# Patient Record
Sex: Female | Born: 1955
Health system: Southern US, Community
[De-identification: ages and names within clinical notes are randomized; demographics above are authoritative.]

## PROBLEM LIST (undated history)

## (undated) DIAGNOSIS — IMO0001 Reserved for inherently not codable concepts without codable children: Secondary | ICD-10-CM

## (undated) DIAGNOSIS — E119 Type 2 diabetes mellitus without complications: Secondary | ICD-10-CM

## (undated) DIAGNOSIS — E079 Disorder of thyroid, unspecified: Secondary | ICD-10-CM

## (undated) DIAGNOSIS — N390 Urinary tract infection, site not specified: Secondary | ICD-10-CM

## (undated) DIAGNOSIS — E785 Hyperlipidemia, unspecified: Secondary | ICD-10-CM

## (undated) DIAGNOSIS — D573 Sickle-cell trait: Secondary | ICD-10-CM

## (undated) DIAGNOSIS — Z5189 Encounter for other specified aftercare: Secondary | ICD-10-CM

## (undated) DIAGNOSIS — Z940 Kidney transplant status: Secondary | ICD-10-CM

## (undated) DIAGNOSIS — N189 Chronic kidney disease, unspecified: Secondary | ICD-10-CM

## (undated) DIAGNOSIS — M1030 Gout due to renal impairment, unspecified site: Secondary | ICD-10-CM

## (undated) DIAGNOSIS — I10 Essential (primary) hypertension: Secondary | ICD-10-CM

## (undated) HISTORY — PX: OTHER SURGICAL HISTORY: SHX169

## (undated) HISTORY — DX: Reserved for inherently not codable concepts without codable children: IMO0001

## (undated) HISTORY — PX: PARATHYROIDECTOMY: SHX19

## (undated) HISTORY — DX: Hyperlipidemia, unspecified: E78.5

## (undated) HISTORY — DX: Encounter for other specified aftercare: Z51.89

## (undated) HISTORY — DX: Disorder of thyroid, unspecified: E07.9

## (undated) HISTORY — PX: TUBAL LIGATION: SHX77

---

## 2003-03-22 ENCOUNTER — Encounter: Admission: RE | Admit: 2003-03-22 | Discharge: 2003-03-22 | Payer: Self-pay | Admitting: Internal Medicine

## 2003-03-22 ENCOUNTER — Encounter: Payer: Self-pay | Admitting: Internal Medicine

## 2004-08-09 HISTORY — PX: VAGINAL HYSTERECTOMY: SUR661

## 2004-09-25 ENCOUNTER — Ambulatory Visit (HOSPITAL_COMMUNITY): Admission: RE | Admit: 2004-09-25 | Discharge: 2004-09-25 | Payer: Self-pay | Admitting: *Deleted

## 2005-07-19 ENCOUNTER — Encounter (INDEPENDENT_AMBULATORY_CARE_PROVIDER_SITE_OTHER): Payer: Self-pay | Admitting: Specialist

## 2005-07-19 ENCOUNTER — Inpatient Hospital Stay (HOSPITAL_COMMUNITY): Admission: RE | Admit: 2005-07-19 | Discharge: 2005-07-21 | Payer: Self-pay | Admitting: Obstetrics and Gynecology

## 2006-09-26 ENCOUNTER — Inpatient Hospital Stay (HOSPITAL_COMMUNITY): Admission: EM | Admit: 2006-09-26 | Discharge: 2006-10-04 | Payer: Self-pay | Admitting: Emergency Medicine

## 2006-09-26 ENCOUNTER — Ambulatory Visit: Payer: Self-pay | Admitting: Cardiology

## 2006-09-26 ENCOUNTER — Encounter (INDEPENDENT_AMBULATORY_CARE_PROVIDER_SITE_OTHER): Payer: Self-pay | Admitting: Specialist

## 2006-09-27 ENCOUNTER — Ambulatory Visit: Payer: Self-pay | Admitting: *Deleted

## 2006-09-30 ENCOUNTER — Encounter: Payer: Self-pay | Admitting: Internal Medicine

## 2006-10-07 ENCOUNTER — Ambulatory Visit (HOSPITAL_COMMUNITY): Admission: RE | Admit: 2006-10-07 | Discharge: 2006-10-07 | Payer: Self-pay | Admitting: Nephrology

## 2006-10-24 ENCOUNTER — Ambulatory Visit: Payer: Self-pay | Admitting: Cardiology

## 2006-11-22 ENCOUNTER — Ambulatory Visit: Payer: Self-pay | Admitting: Vascular Surgery

## 2007-02-16 ENCOUNTER — Telehealth (INDEPENDENT_AMBULATORY_CARE_PROVIDER_SITE_OTHER): Payer: Self-pay | Admitting: *Deleted

## 2007-02-28 ENCOUNTER — Ambulatory Visit: Payer: Self-pay | Admitting: Vascular Surgery

## 2007-05-06 ENCOUNTER — Emergency Department (HOSPITAL_COMMUNITY): Admission: EM | Admit: 2007-05-06 | Discharge: 2007-05-07 | Payer: Self-pay | Admitting: Emergency Medicine

## 2007-05-11 ENCOUNTER — Ambulatory Visit (HOSPITAL_COMMUNITY): Admission: RE | Admit: 2007-05-11 | Discharge: 2007-05-11 | Payer: Self-pay | Admitting: Nephrology

## 2007-06-06 ENCOUNTER — Inpatient Hospital Stay (HOSPITAL_COMMUNITY): Admission: EM | Admit: 2007-06-06 | Discharge: 2007-06-08 | Payer: Self-pay | Admitting: *Deleted

## 2007-06-06 ENCOUNTER — Ambulatory Visit: Payer: Self-pay | Admitting: Vascular Surgery

## 2007-06-07 ENCOUNTER — Ambulatory Visit: Payer: Self-pay | Admitting: Vascular Surgery

## 2007-09-19 ENCOUNTER — Ambulatory Visit (HOSPITAL_COMMUNITY): Admission: RE | Admit: 2007-09-19 | Discharge: 2007-09-19 | Payer: Self-pay | Admitting: Nephrology

## 2008-08-09 DIAGNOSIS — M1030 Gout due to renal impairment, unspecified site: Secondary | ICD-10-CM

## 2008-08-09 HISTORY — DX: Gout due to renal impairment, unspecified site: M10.30

## 2008-10-02 DIAGNOSIS — N186 End stage renal disease: Secondary | ICD-10-CM | POA: Insufficient documentation

## 2008-10-07 ENCOUNTER — Ambulatory Visit: Payer: Self-pay | Admitting: Gastroenterology

## 2008-10-07 DIAGNOSIS — R197 Diarrhea, unspecified: Secondary | ICD-10-CM

## 2008-10-10 ENCOUNTER — Encounter: Payer: Self-pay | Admitting: Gastroenterology

## 2008-11-20 ENCOUNTER — Ambulatory Visit: Payer: Self-pay | Admitting: Gastroenterology

## 2008-11-27 ENCOUNTER — Encounter: Admission: RE | Admit: 2008-11-27 | Discharge: 2008-11-27 | Payer: Self-pay | Admitting: Nephrology

## 2008-12-04 ENCOUNTER — Encounter: Admission: RE | Admit: 2008-12-04 | Discharge: 2008-12-04 | Payer: Self-pay | Admitting: Nephrology

## 2008-12-17 ENCOUNTER — Emergency Department (HOSPITAL_COMMUNITY): Admission: EM | Admit: 2008-12-17 | Discharge: 2008-12-17 | Payer: Self-pay | Admitting: Emergency Medicine

## 2009-02-06 HISTORY — PX: OTHER SURGICAL HISTORY: SHX169

## 2009-02-17 ENCOUNTER — Encounter: Admission: RE | Admit: 2009-02-17 | Discharge: 2009-02-17 | Payer: Self-pay | Admitting: Nephrology

## 2010-07-24 ENCOUNTER — Encounter
Admission: RE | Admit: 2010-07-24 | Discharge: 2010-07-24 | Payer: Self-pay | Source: Home / Self Care | Attending: Nephrology | Admitting: Nephrology

## 2010-08-07 ENCOUNTER — Encounter
Admission: RE | Admit: 2010-08-07 | Discharge: 2010-08-07 | Payer: Self-pay | Source: Home / Self Care | Attending: Nephrology | Admitting: Nephrology

## 2010-08-07 HISTORY — PX: BREAST BIOPSY: SHX20

## 2010-08-30 ENCOUNTER — Encounter: Payer: Self-pay | Admitting: Cardiology

## 2010-11-17 LAB — POCT I-STAT, CHEM 8
Calcium, Ion: 1.28 mmol/L (ref 1.12–1.32)
Chloride: 105 mEq/L (ref 96–112)
Glucose, Bld: 92 mg/dL (ref 70–99)
HCT: 33 % — ABNORMAL LOW (ref 36.0–46.0)
Hemoglobin: 11.2 g/dL — ABNORMAL LOW (ref 12.0–15.0)
Potassium: 5.3 mEq/L — ABNORMAL HIGH (ref 3.5–5.1)

## 2010-12-22 NOTE — Assessment & Plan Note (Signed)
OFFICE VISIT   [name] [name], [name] [name]  DOB:  Dec 23, 1955                                       06/06/2007  [id number]   HISTORY:  [name] was seen today regarding the right upper arm AV  fistula which I created in February of 2008.  Fistula has functioned  well for the last five dialysis sessions with them being able to utilize  two sticks in the fistula although that was difficult initially.  She  has had good flows.  She has no symptoms of steal in the right hand with  some mild cold sensation but no numbness or pain.  I would recommend  discontinuing the Diatek catheter at this point to see how the fistula  works on a long term basis and return to see [place] on a p.r.n. basis.   Nelda Severe Kellie Simmering, M.D.  Electronically Signed   JDL/MEDQ  D:  06/06/2007  T:  06/07/2007  Job:  [id number]   cc:   Donato Heinz, M.D.

## 2010-12-22 NOTE — Assessment & Plan Note (Signed)
OFFICE VISIT   LUZELENA, DURRANI  DOB:  [birth year]                                       02/28/2007  [id number]   The patient had an AV fistula in the right upper arm created by me in  February of this year which has an excellent pulse and palpable thrill.  It was utilized several times successfully, although on some occasions  they only stuck the fistula in one area.  Recently she had a problem  which sounds like an infiltration in the mid upper arm with some burning  discomfort and the fistula has not been used for the last few weeks and  the Diatek catheter has been utilized.   EXAMINATION:  The pulse of the fistula continues to have an excellent  pulse and a palpable thrill with good flow and seemingly a good caliber  vein.  There is no evidence of steal distally.  There is some mild  swelling in the mid-portion of the right upper arm.   I would recommend waiting for another 4 weeks before utilizing the  fistula again, letting any swelling resolve and if at that point the  fistula is utilized successfully, obtain a fistulogram and have her  return to our office for further evaluation.   Nelda Severe Kellie Simmering, M.D.  Electronically Signed   JDL/MEDQ  D:  02/28/2007  T:  03/01/2007  Job:  [id number]   cc:   Maudie Flakes. Hassell Done, M.D.

## 2010-12-22 NOTE — Discharge Summary (Signed)
NAMECHRISLEY, [name]           ACCOUNT NO.:  [id number]   MEDICAL RECORD NO.:  [id number]          PATIENT TYPE:  INP   LOCATION:  5507                         FACILITY:  [facility]   PHYSICIAN:  Darrold Span. Florene Glen, M.D.  DATE OF BIRTH:  Dec 05, 1955   DATE OF ADMISSION:  06/06/2007  DATE OF DISCHARGE:  06/08/2007                               DISCHARGE SUMMARY   DISCHARGE DIAGNOSES:  1. Gram-positive cocci bacteremia.  2. End-stage renal disease.  3. Hypertension.  4. Secondary hyperparathyroidism.  5. Anemia of chronic disease.   PROCEDURES:  1. June 07, 2007, removal of hemodialysis permanent catheter by Dr.      Deitra Mayo.  2. Hemodialysis.   CONSULTATIONS:  Dr. Deitra Mayo.   HISTORY OF PRESENT ILLNESS:  [name] [name] is a 55 year old African-  American female with a past medical history of end-stage renal disease  who presented to the emergency room with onset of fever, myalgias, and  nausea and vomiting.  She was in her usual state of health prior to this  incident.  She denies abdominal pain, nausea, vomiting, diarrhea, no  urinary symptoms, no upper respiratory symptoms.  The patient was seen  at [facility] who examined her AV fistula, found it to be very mature, and  scheduled her to have her catheter removed.   HOSPITAL COURSE:  1. Gram-positive cocci bacteremia.  Patient was admitted for a septic      workup.  Chest x-ray was within normal limits.  Urinalysis showed      no bacteria or white blood cells.  The patient was placed on      empiric vancomycin and Fortaz.  Arrangements were made for Dr.      Scot Dock to remove patient's hemodialysis permanent catheter which      was performed on June 07, 2007 after hemodialysis.  Patient      remained afebrile for the remainder of the hospitalization.  No      further nausea and vomiting.  She continued to do well.  Cultures      were positive after 24 hours for gram-positive cocci with no      __________ on  discharge.  2. Hypertension.  Patient continued with her beta blocker, lisinopril      was placed on hold, and there was no further intervention.  3. Secondary hyperparathyroidism.  Patient continued with her Renagel      as well as Sensipar and vitamin D.  No intervention.  4. Anemia of chronic disease.  Hemoglobins remained stable.  Patient      continued on her usual outpatient dose of Epogen.  No changes were      made.  5. End-stage renal disease.  Patient underwent hemodialysis x1 this      admission using her new AV fistula with 17-gauage needles, and we      will continue to do this until maximal maturity.   DISCHARGE MEDICATIONS:  1. Avandia 4 mg every day.  2. Levothyroxine 25 mcg every day.  3. Nephrovite every day.  4. Aspirin 81 mg every day.  5. Simvastatin 20 mg b.i.d.  6.  Sensipar 60 mg every day.  7. Renagel 800 mg 4 t.i.d. a.c. and 2 with snacks.   DISCHARGE INSTRUCTIONS:  The patient is to discontinue her lisinopril  and metoprolol at this time.  She will resume her renal diet and her  fluid restriction.  Increase activity as tolerated.  Patient may remove  dressing to her right upper chest and clean this area with antibacterial  soap and water and use a Band-Aid only.  She is instructed to have the  hemodialysis nurses assess this area to assure no signs of infection.  She will return to hemodialysis her usual scheduled day.  She is aware  that her new estimated dry weight will be 84.0 kg.  She will continue  vancomycin 750 mg IV every hemodialysis treatment through June 28, 2007 which will be a total of 3 weeks.  Her [name] [name] has been  discontinued.   DICTATED BY:  Leafy Kindle, PA      Darrold Span. Florene Glen, M.D.  Electronically Signed     Darrold Span. Florene Glen, M.D.  Electronically Signed    ACP/MEDQ  D:  06/08/2007  T:  06/09/2007  Job:  [id number]   cc:   [facility]

## 2010-12-22 NOTE — H&P (Signed)
[name] [name]           ACCOUNT NO.:  [id number]   MEDICAL RECORD NO.:  [id number]          PATIENT TYPE:  INP   LOCATION:  5507                         FACILITY:  [facility]   PHYSICIAN:  Darrold Span. Florene Glen, M.D.  DATE OF BIRTH:  08/30/1955   DATE OF ADMISSION:  06/06/2007  DATE OF DISCHARGE:  06/08/2007                              HISTORY & PHYSICAL   REASON FOR ADMISSION:  Fever and myalgias.   HISTORY OF PRESENT ILLNESS:  [name] [name] is a very pleasant 55-  year-old African-American female with a past medical history significant  for end-stage renal disease currently on hemodialysis, (Monday,  Wednesday and Friday at [facility]), type 2 diabetes  mellitus and anemia of chronic disease, who presented to the emergency  room today with abrupt onset of fever with max as 104, myalgias and  nausea and vomiting this a.m.  The patient was in her usual state of  health, prior to this episode, denied abdominal pain, nausea, vomiting,  diarrhea or urinary symptoms.  She denied any upper respiratory  symptoms.  Of note, the patient was seen in [facility] today.  She has a nice  AVF in place.  It has been cannulated x4, and she has been scheduled to  have her catheter removed.   ALLERGIES:  NONE.   MEDICATIONS:  1. Metoprolol 50 mg b.i.d.  2. Avandia 4 mg daily.  3. Levothyroxine 25 mcg daily.  4. Nephro-Vite daily.  5. Enteric-coated aspirin 81 mg daily.  6. Simvastatin 20 mg daily.  7. Sensipar 60 mg daily.  8. Renagel 4 t.i.d. a.c. and two with snacks.   SOCIAL HISTORY:  The patient currently lives in [place] area with her  husband.  She does have one daughter.  No alcohol abuse and no tobacco  use.  The patient does not work and is currently disabled.   FAMILY HISTORY:  Noncontributory.   REVIEW OF SYSTEMS:  Please see HPI, otherwise seven reviews of systems  were all negative.   PHYSICAL EXAM:  VITALS:  Temperature 100.3, pulse 119, respirations  16,  blood pressure 145/78.  GENERAL:  [name] [name] appears ill and slightly diaphoretic.  HEENT:  Head is atraumatic, normocephalic.  Pupils are equal, round,  reactive to light.  Oral mucosa is without injection or lesion.  NECK:  Neck is supple.  No JVD.  No palpable lymphadenopathy.  CHEST:  Clear to auscultation bilaterally.  No rales, rhonchi or rhonchi  or wheeze.  HEART:  Regular rate and rhythm.  S1-S2.  ABDOMEN:  Soft and nontender to palpation in all quadrants.  No rebound  no guarding.  EXTREMITIES:  Right AV fistula, upper arm with positive thrill.  Right  Perm-A-Cath.  No pulse, no erythema.   LABORATORY:  Pending.   ASSESSMENT AND PLAN:  As discussed with Dr. Erling Cruz, who was in to  see and examine this patient.  1. Fevers and weakness on admission.  Plan to admit.  Septic workup to      include chest x-ray, urinalysis, and blood cultures x2.  This is      likely  secondary to a permanent catheter infection.  Arrange for      UDS to pull Perm-A-Cath as soon as possible.  We will call a      consult.  Cover the patient with vancomycin and Fortaz empirically.  2. Hypertension.  The patient will continue her beta blocker at this      time.  Blood pressures are stable.  3. Secondary hyperparathyroidism.  Continue patient's Renagel,      Sensipar and vitamin D.  4. Anemia of chronic disease.  Follow hemoglobin, continue epo.      Leafy Kindle, PA      Greenfield C. Florene Glen, M.D.  Electronically Signed    MY/MEDQ  D:  06/08/2007  T:  06/09/2007  Job:  [id number]   cc:   [facility]

## 2010-12-25 NOTE — Discharge Summary (Signed)
[name], [name]           ACCOUNT NO.:  [id number]   MEDICAL RECORD NO.:  [id number]          PATIENT TYPE:  INP   LOCATION:  5508                         FACILITY:  [facility]   PHYSICIAN:  Domingo Mend, M.D. DATE OF BIRTH:  [birth year]   DATE OF ADMISSION:  09/26/2006  DATE OF DISCHARGE:  10/04/2006                               DISCHARGE SUMMARY   DISCHARGE DIAGNOSES:  1. New end-stage renal disease.  2. Type 2 diabetes.  3. Hypertension.  4. Hyperlipidemia.  5. Anemia.  6. Hypothyroidism.   DISCHARGE MEDICATIONS:  1. Nephro-Vite one tablet daily.  2. Aspirin 81 mg daily.  3. Zocor 20 mg one tablet q.h.s.  4. Metoprolol 25 mg b.i.d.  5. Avandia 4 mg daily.  6. RenaGel 800 mg two tablets t.i.d. with meals.   DISPOSITION AND FOLLOWUP:  The patient is discharged from the hospital  in stable condition.  She will be dialyzing at the [facility] on a Tuesday, Thursday, Saturday basis.  Her time slot is  for 11 a.m., however, she will need to be there for her first dialysis  on Thursday, February28 at 10:30 a.m.  She also has an appointment to  see Dr. Domenic Polite from [facility] on Monday, March17 at 11:30  a.m.  Upon followup, please note that PTH is pending from the hospital,  so please dose vitamin D as necessary and the patient's dry weight is to  be established.  Her last weight in the hospital was 87 kg.   IMAGING STUDIES:  A renal ultrasound on September 26, 2006, showed  bilateral echogenic kidneys; no evidence for hydronephrosis; and small  left renal cyst.  Urine was negative for eosinophils.  Chest x-ray on  September 28, 2006, showed catheter tip in the SVC.  No pneumothorax or  any other acute findings.  Another chest x-ray on September 28, 2006,  showed mild cardiac enlargement and left lung atelectasis with no CHF or  pneumonia.  A 2-D echo on February22, 2008, showed a normal left  ventricular systolic function with an ejection  fraction of 55-60%,  mildly increased left ventricular wall thickness and mild mitral  valvular regurgitation.   HISTORY AND PHYSICAL EXAMINATION:  For full details, please refer to the  chart; but in brief, [name] [name] is a 55 year old African American  woman who has a history of uncontrolled diabetes type 2 and hypertension  who does not regularly take her medication; who went to see her primary  care physician in  on the day of admission because she was having flu-  like symptoms consisting of cold chills, nausea and vomiting for the  past 2 weeks.  No fever, chest pain or shortness of breath.  Upon  arrival to the emergency room, her BUN was found to be 148 and a  creatinine of 24.3.  She reports no recent NSAID use and she does report  decreased urine output per patient.   ALLERGIES:  She has no known drug allergies.   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  She denies any  illicit drug use, alcohol use.  Was a smoker for about 15 years, but  quit in 1987.  Lives with her husband has two healthy children.   PHYSICAL EXAMINATION:  VITAL SIGNS:  Upon admission:  Blood pressure was  193/106, heart rate 106, temperature 97.2, O2 sats 100% on room air.  GENERAL:  Awake and oriented x3, not in acute distress.  CARDIOVASCULAR: Regular rate and rhythm with no murmurs, rubs or  gallops.  LUNGS:  She had mild bibasilar crackles and no pericardial rub.  ABDOMEN: Obese, soft, nontender, nondistended with positive bowel  sounds.  EXTREMITIES:  She had 1+ edema to mid-shin bilaterally.   ADMISSION LABS:  Sodium of 132, potassium 5.2, chloride 106, bicarb 14,  BUN 148, creatinine 24.3 with an anion gap of 17, glucose of 100.  WBCs  8.1, hemoglobin 8.1 and platelets of 182.   HOSPITAL COURSE:  New end-stage renal disease.  Again, unsure of  chronicity.  This is likely due to her uncontrolled diabetes and  hypertension.  No reversible causes found.  SPEP and UPEP were  negative  for a monoclonal spike.  No evidence of obstruction or hydronephrosis on  renal ultrasound.  The patient was seen by [facility] and sent to the OR.  At  the time, she has a right IJ catheter as well as a right arm AV fistula.  Dialysis orders have been faxed to the [facility], however, for  now she will be on a time of 4 hours with tight heparin.  Since her  potassium was 3.8, we were using a bath consisting of potassium of 4.0,  calcium of 2.5.  Her dry weight is still to be established.  Her last  dry weight in the hospital was of 87 kg.   Of note, she is hepatitis B surface antigen negative.   PTH is pending at time of dictation.  Her phosphorus is high, was 15  upon admission down to 7.2.  Secondary to her high calcium, we have her  on non-calcium containing phosphorus binders.  She is on Renagel 800 mg  two tablets t.i.d. with meals.   For her anemia, she was FOBT negative.  Her hemolytic panel including  LDH and haptoglobin were normal; negative for hemolysis.  Her iron  studies showed a serum iron of 59, a TRBC of 217 and a percent  saturation of 27%.  In the hospital, she was receiving Aranesp 100 mcg.  This will be converted to Epogen 10,000 units three times a week with  each hemodialysis as an outpatient.  Vitamin 0000000 and folic acid levels  were normal.   Chest pain.  Twice after dialysis, the patient had some chest pain.  Cardiology was consulted.  Because of her multiple risk factors, they  decided to go ahead with a cath which surprisingly was completely clean.  She will follow up with Dr. Domenic Polite as an outpatient for this reason.   Hypothyroidism.  The patient was discovered to be hypothyroid here in  the hospital.  Her TSH was elevated 5.858 and her free T4 was low at  0.75; so we have started her on 25 mcg of Synthroid in the hospital and  a repeat TSH should be checked in 4-6 weeks to make sure she is  adequately corrected.  Type 2 diabetes.  Her  hemoglobins were under good control in the  hospital with sliding scale insulin.  She will be discharged on her home  regimen of Avandia 4 mg daily.  Her CBG and  glycemia should be closely  monitored as an outpatient and her diabetes medications adjusted as  necessary.   Hypertension.  When the patient came in, blood pressure was very  elevated and she was placed on Norvasc and metoprolol.  However, her  Norvasc was decreased and subsequently discontinued and she will only be  on going home on metoprolol 25 mg p.o. b.i.d.   Other issues were stable throughout hospitalization.   Vital signs on day of discharge:  Blood pressure 101/66, heart rate 85,  temperature 99.6, O2 sats 97% on room air.   Labs upon discharge:  Sodium 138, potassium 3.6, chloride 103, bicarb  27, BUN 17, creatinine 8.11, glucose 134, albumin 2.2, calcium 9.9,  phosphorus 7.2, WBCs 8.7, hemoglobin 9.7 and platelets of 138.      Domingo Mend, M.D.  Electronically Signed     EH/MEDQ  D:  10/04/2006  T:  10/04/2006  Job:  [id number]

## 2010-12-25 NOTE — Op Note (Signed)
[name] [name], [name] [name]           ACCOUNT NO.:  [id number]   MEDICAL RECORD NO.:  [id number]          PATIENT TYPE:  INP   LOCATION:  9311                          FACILITY:  [facility]   PHYSICIAN:  Sherry A. Dickstein, M.D.DATE OF BIRTH:  April 12, 1956   DATE OF PROCEDURE:  07/19/2005  DATE OF DISCHARGE:                                 OPERATIVE REPORT   PREOPERATIVE DIAGNOSIS:  Fibroid uterus.   POSTOPERATIVE DIAGNOSIS:  Fibroid uterus.   PROCEDURE:  TAH.   SURGEON:  Dr. Irven Baltimore and Dr. Nunzio Cobbs.   ANESTHESIA:  General.   INDICATIONS:  This is a 55 year old who has had known fibroid uterus for  several years, however, the fibroids have increased in size significantly.  The patient has been followed with ultrasound. The edges has shown over the  past several months that the fibroids have suddenly increased in size. The  patient has had excessive bleeding. The patient was treated with Lupron. The  Lupron however, did not completely stop her bleeding. The patient has  continued to bleed despite this Lupron therapy. The patient was followed  with ultrasounds and despite the Lupron therapy, the fibroids were growing.  A special ultrasound was performed with Doppler study which showed no  increased blood flow in the uterus. Because of a increasing size of the  fibroids and continued bleeding, the patient is brought to the operating  room for abdominal hysterectomy.   FINDINGS:  A 12 week size uterus with multiple fibroids present, fallopian  tubes and ovaries within normal limits.   DESCRIPTION OF PROCEDURE:  The patient was brought into the operating room  and given adequate general anesthesia. She was placed in a frog-leg  position. Her abdomen and vagina were washed with Betadine. Foley catheter  was inserted into the bladder. The vagina was washed with Betadine as well.  The patient was taken out of the a frog-leg position. She was draped in a  sterile fashion.. A  Pfannenstiel incision was made, bleeders were  cauterized, the fascia was identified. The fascia was incised. The midline  area was identified and was incised sharply and bluntly and with cautery.  Bleeders were cauterized. The peritoneum was identified and entered sharply.  The Balfour retractor was placed in the abdominal cavity. The bowel was  packed off with wet lap pads. The pelvis was very tight because of the  excessive fat that was present as well as the size of the uterus. The left  round ligament was identified. It was grasped with a Kary Kos, it was doubly  stitched with #0 Vicryl sutures and it was cauterized in between. Kelly  clamps were placed on either side of the uterus and the right round ligament  was identified and suture ligated with #0 Vicryl as well. The left utero-  ovarian ligament was doubly clamped.  it was cut, it was free tied with #0  Vicryl ligature and then a second ligature was taken. The same procedure was  performed on the right, adequate hemostasis was present. The bladder flap  was developed with sharp dissection and the bladder was developed off of the  lower uterine segment and cervix with blunt dissection. The left uterine  artery was identified, it was doubly clamped, cut and suture ligated with #0  Vicryl ligasure. The same procedure was performed on the right. The left  uterosacral ligament was clamped, cut and suture ligated with #0 Vicryl. The  cardinal ligaments were clamped, cut and suture ligated with #0 Vicryl  ligasures. The angles of the vagina were identified. They were clamped, cut  and suture ligated with #0 Vicryl. All of this surgery was very difficult  because of the size of the fibroids and the size of the patient. The uterus  was then taken off of the cervix with sharp dissection and the uterus was  sent to pathology for frozen section to assure that none of the fibroids  were malignant. The cervix was then continued to be dissected  and the blood  supply, clamped, cut and suture ligated. The angles of the vagina where were  clamped and cut and suture ligated and the cervix was removed from the  vagina in this fashion. The vaginal cuff was then closed first by placing a  whip stitch along the edge of the vagina and then the cervix was closed with  #0 Vicryl in figure-of-eight interrupted stitches. There was some bleeding  present along the cervical cuff. This was closed both by individual  cauterizing vessels and then by #0 Vicryl figure-of-eight stitches. Once  adequate hemostasis was present, the uterosacral ligaments were tied to the  angles of the vagina for support. The abdomen was irrigated with large  amounts of warm saline, small bleeders were cauterized. The whole pelvis was  inspected, there was no significant bleeding present. All packs were removed  from the abdomen. The layers were inspected, small bleeders were present  near the muscle and near the fascial edges. These were cauterized. The  fascia was then closed using #0 Monocryl in a running stitch running  laterally to the midline x2. The fat was then closed using 2-0 plain in a  running stitch. The skin was then closed using 4-0 Monocryl in a  subcuticular running stitch. The skin was also closed with Dermabond. The  patient was then awakened. She was extubated. She was moved from the  operating table to a stretcher in stable condition. Complications were none.  Estimated blood loss 300 mL. Specimen was uterus and cervix.      Sherry A. Irven Baltimore, M.D.  Electronically Signed     SAD/MEDQ  D:  07/19/2005  T:  07/19/2005  Job:  [id number]

## 2010-12-25 NOTE — Consult Note (Signed)
[name]  SHANTOYA, LAME           ACCOUNT NO.:  [id number]   MEDICAL RECORD NO.:  [id number]          PATIENT TYPE:  INP   LOCATION:  5506                         FACILITY:  [facility]   PHYSICIAN:  Cristopher Estimable. Lattie Haw, MD, FACCDATE OF BIRTH:  September 12, 1955   DATE OF CONSULTATION:  09/29/2006  DATE OF DISCHARGE:                                 CONSULTATION   PRIMARY CARE PHYSICIAN:  Theodoro Parma. Conley Canal, M.D.   HISTORY OF PRESENT ILLNESS:  A 55 year old woman with end-stage renal  disease, referred for evaluation of chest discomfort with ischemic  quality.  [name] [name] has no prior cardiac history.  She has never been  seen by a cardiologist.  She has never undergone any significant cardiac  testing.  Unfortunately, she has significant cardiovascular risk  factors, including end-stage renal disease, diabetes, and hypertension.  Creatinine has progressed from a level of 2.8 approximately 14 months  ago to 22 when she presented with symptoms of uremia.  She underwent her  first dialysis treatment without difficulty.  During the second  performed today, she noted feeling of chest constriction or chest  stiffness associated with moderate dyspnea.  This persisted for  approximately 20 minutes and resolved spontaneously.  Following  dialysis, she had a second episode that was treated with sublingual  nitroglycerin, with apparent benefit.  EKG obtained in proximity to  these episodes shows no significant change from a tracing performed in  2006.  There are slightly more prominent lateral T-wave abnormalities.   The patient does not have significant congestive heart failure when she  presented.  She did have some ankle edema.  Her chest x-ray shows only  scarring or atelectasis in the lingula.   The chest discomfort was relatively mild..  There was no radiation.  There was no associated diaphoresis or nausea.  There was no  relationship to position, and the discomfort was not pleuritic.  The  patient had never experienced these symptoms previously.   PAST MEDICAL HISTORY:  1. Severe hypertension, for which she has not been followed closely by      a physician and is not reliably taking medication.  2. Ditto for her renal history.  3. She has apparently been treated with metformin for diabetes until      recently, despite her known renal insufficiency.  She was      subsequently changed to Actos.   PAST SURGICAL HISTORY:  1. Only notable for total abdominal hysterectomy, without      oophorectomy.  2. She underwent endoscopy in the past, which revealed only hiatal      hernia.   MEDICATIONS PRESCRIBED FOR HER AT THE TIME OF ADMISSION:  1. Avandia at an unknown dosage.  2. HCTZ 25 mg daily.  3. Lotrel 2.5/10 mg daily.   She has no known drug allergies.   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  No excessive use of alcohol.  Lifestyle is sedentary.  She  has not noted dyspnea or chest discomfort with her usual activities,  which includes some housework.   FAMILY HISTORY:  Mother is alive and well in her 81s; father died at age  56 with emphysema.   REVIEW OF SYSTEMS:  Notable for not having received vaccinations in the  past due to her refusal, intermittent headaches, some sort of history of  seizures, anemia, urinary frequency, the need for corrective lenses for  myopia, recent pruritus, and intermittent hot flashes and chills.  She  has had anorexia of late.  Her weight in recent weeks is uncertain.  All  other systems reviewed and are negative.   PHYSICAL EXAMINATION:  GENERAL:  Pleasant, remarkably well-appearing  woman.  VITAL SIGNS:  The weight is 94 kg, down from 96 kg at presentation.  Maximal temperature 99, blood pressure 115/60, heart rate 85 and  regular, respirations 18.  O2 saturation 100% on 2 liters nasal oxygen.  Urine output has been on  the order of 1 liter per day.  HEENT:  EOMs full; normal oral mucosa; normal lids and conjunctivae.  NECK:  No jugular venous distension.  CARDIAC:  Normal first and second heart sounds; modest early systolic  ejection murmur; no rub appreciated; no pulsus paradoxus.  LUNGS:  Entirely clear.  ABDOMEN:  Soft and nontender; normal bowel sounds; no organomegaly.  EXTREMITIES:  Trace edema; normal distal pulses.  MUSCULOSKELETAL:  No joint deformities.  NEUROMUSCULAR:  Symmetric strength and tone.  SKIN:  No significant lesions.  PSYCHIATRIC:  Alert and oriented; normal affect.   EKG:  Normal sinus rhythm at a rate of 95; biphasic and slightly  inverted T-waves in leads I and L; QT prolongation.   Other laboratory notable for initial hemoglobin of 7, and hematocrit of  20, now 9.5 and 27.4, presumably related to transfusion; MCV was low at  76; creatinine is decreased to 11.6, and BUN to 51.  Electrolytes are  otherwise normal.  INR was 1.2 on admission.  Lipid profile is  suboptimal, with total cholesterol 211, LDL of 150, and HDL of 32.  Initial markers are negative, with a slightly elevated total CK, and  negative troponin and MB.  Initial phosphorus was 14.7, with a calcium  of 6.9.  Haptoglobin is normal.  Iron is on the low side, as is iron  binding.  TSH is slightly elevated at 5.9.  A1c is 6.   IMPRESSION:  [name] [name] presents with progressive renal insufficiency,  presumably related to hypertension and to a lesser extent diabetes.  There may be some contribution fusion from her use of ACE inhibitors,  but this is likely modest and will not result in significant reversal of  her end-stage disease.   Blood pressure control is remarkably improved after only two dialysis  treatments.  Antihypertensives will be adjusted as necessary.   She has mild hyperlipidemia but multiple cardiovascular risk factors.  She should be treated with a statin.  Her chest discomfort is of  worrisome quality.  She does not appear to  have enough fluid overload to attribute this to congestive heart  failure.  This certainly did not sound like typical GERD.  We will  obtain follow-up cardiac markers and an EKG in the morning as well as an  echocardiogram.  Treatment will be intensified by adding intravenous  nitroglycerin for now.  If chest discomfort recurs, intravenous heparin  will be added.  We will start low-dose beta  blocker.  Once initial evaluation is complete, we will determine whether  stress testing or coronary angiography is warranted.  We greatly appreciate the referral of this nice woman and will be happy  to follow her with you.      Cristopher Estimable. Lattie Haw, MD, [facility]  Electronically Signed     RMR/MEDQ  D:  09/29/2006  T:  09/30/2006  Job:  [id number]

## 2010-12-25 NOTE — Op Note (Signed)
NAME:  [name] [name], RATTAN NO.:  [id number]   MEDICAL RECORD NO.:  [id number]          PATIENT TYPE:  AMB   LOCATION:  ENDO                         [facility]ACILITY:  [facility]   PHYSICIAN:  Waverly Ferrari, M.D.    DATE OF BIRTH:  [birth year]   DATE OF PROCEDURE:  09/25/2004  DATE OF DISCHARGE:                                 OPERATIVE REPORT   PROCEDURE:  Colonoscopy.   INDICATIONS:  Iron-deficiency anemia, rule out GI causes of blood loss.   ANESTHESIA:  Demerol 10, Versed 1 mg.   PROCEDURE:  With the patient mildly sedated in the left lateral decubitus  position, the Olympus videoscopic colonoscope was inserted in the rectum and  passed under direct vision to the cecum identified by ileocecal valve and  crow's foot of the cecum, both of which were photographed. From this point,  the colonoscope was slowly withdrawn taking circumferential views of colonic  mucosa stopping only in the rectum which appeared normal in direct and  retroflexed view. The endoscope was straightened, withdrawn. The patient's  vital signs and pulse, remained stable. The patient tolerated procedure well  without complications.   FINDINGS:  Unremarkable colonoscopic examination to the cecum.   PLAN:  Given the fact the patient is anemic and describes heavy menses with  a negative GI examination, I think that further evaluation can be provided  by gynecology.      GMO/MEDQ  D:  09/25/2004  T:  09/25/2004  Job:  [id number]   cc:   Sherry A. Irven Baltimore, M.D.  [address]  [place]  [place] [address]  Fax: [phone]

## 2010-12-25 NOTE — H&P (Signed)
NAME:  [name] [name], [name] [name] NO.:  [id number]   MEDICAL RECORD NO.:  [id number]          PATIENT TYPE:  EMS   LOCATION:  MAJO                         FACILITY:  [facility]   PHYSICIAN:  [name] [name], D.O.    DATE OF BIRTH:  Feb 23, 1956   DATE OF ADMISSION:  09/26/2006  DATE OF DISCHARGE:                              HISTORY & PHYSICAL   PRIMARY CARE PHYSICIAN:  Dr. Harley [name].   CHIEF COMPLAINT:  Elevated lab work in an outpatient setting.   HISTORY OF PRESENT ILLNESS:  [name] [name] was presenting to an  urgent care in [name], [place] today with reports of cold symptoms  for the past 2 weeks.  She resides in [name], [place].  She has seen  Dr. Seleta [name] in the past.  In any event, she reports some nausea,  vomiting and diarrhea with chills about 2 weeks ago and in addition she  reported because of swelling in her right foot.  In any event, lab work  was performed there and she was discovered to have an elevated  creatinine of 22.0.  This was called in to Dr. Conley [name] and he requested  her to be admitted to the hospital.   In the emergency department she was noted to be in hypertensive crisis  with a systolic of [id number] and diastolic over [id number].  She was nonoliguric as  a Foley catheter was placed.  Her creatinine, in the emergency  department, was 24.0, potassium of 5.2.  An EKG is currently pending at  this time.   She was told that she had some kidney problems with regard to her lab  work about 1-1/2 years ago.  She did not followup closely with Dr.  Harley [name].  She does have a history of diabetes.  He did call and  request that she stop her Glucophage at that time.  In any event, she is  in the department alert and oriented, comfortable.  She reports some  problems with foods tasting bad; otherwise, no other complaints.   PAST MEDICAL HISTORY:  Significant for:  1. Hypertension.  2. Diabetes.  3. She is status post a total abdominal  hysterectomy.  She retains her      ovaries, appendix and gallbladder.  She has undergone a GI      evaluation in the past by Dr. Lajoyce [name] with the only finding of a hiatal      hernia.   MEDICATIONS:  She reports that she takes:  1. Avandia, she does not know the dose.  2. Hydrochlorothiazide 25 mg daily.  3. Lotrel 2.5 and 10 mg on a daily basis.   ALLERGIES:  SHE HAS NO KNOWN DRUG ALLERGIES.   SOCIAL HISTORY:  She works as a Pharmacist, hospital.  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  She does not drink or smoke.  She is quite sedentary.   FAMILY HISTORY:  Unremarkable.  Mother is in her 16s alive and healthy.  Father died at age 64 with emphysema.   REVIEW OF SYSTEMS:  She has been having some fatigability, some cold  symptoms.  No blood in her  stools or chest pain.  She has had some  occasional shortness of breath, some swelling of her extremities, right  greater than left.  Otherwise, further review is unremarkable.   PHYSICAL EXAMINATION:  VITAL SIGNS:  In the department, her blood  pressure was elevated at 196/106.  Heart rate was 90.  Respirations 18.  O2 saturation 100%.  GENERAL:  Patient is alert and oriented in no acute distress.  HEENT:  Reveals the head to be normocephalic, atraumatic.  Extraocular  muscles are intact.  NECK:  Supple.  Nontender.  No palpable thyromegaly or mass.  CARDIOVASCULAR EXAM:  Reveals normal S1, S2.  There is a soft  holosystolic murmur in the left sternal border.  No apparent radiation  to the axilla.  LUNGS:  She exhibits normal effort.  There is no dullness to percussion.  They are clear bilaterally.  ABDOMEN:  Soft, obese, nontender.  LOWER EXTREMITIES:  Reveal bipedal ankle edema, +1-2.  NEUROLOGIC EXAM:  Reveals her to be euthymic with focal deficits.   LABORATORY DATA:  Revealing her sodium to be 137, potassium 5.0, BUN 48,  creatinine 24.0, glucose 100, WBC of 8.1, hemoglobin 8.1, platelet count  182.  Chest radiograph is pending, as well as EKG  pending, being done in  the emergency department.   ASSESSMENT:  1. Acute renal failure/nonoliguric.  2. Hypertensive urgency.  3. Probable noncardiogenic heart failure.  4. Obesity.  5. Diabetes mellitus.  6. Very asymmetrical lower extremity edema.   PLAN:  At this time, we are going to consult nephrology, admit Ms.  [name] to inpatient telemetry floor and administer IV Lasix and treat  her poorly controlled hypertension.  We will order [name] [name], [name] [name], as well  as initial rheumatologic screen, though this is more than likely related  to longstanding hypertensive nephrosclerosis.  We will check an  ultrasound as well and await further input from Dr. Justin [name] is on call,  paged and awaiting response at this juncture.      [name] [name], D.O.  Electronically Signed     ESS/MEDQ  D:  09/26/2006  T:  09/27/2006  Job:  [id number]   cc:   Sherril Croon, M.D.  Theodoro Parma. Francina Ames., M.D.

## 2010-12-25 NOTE — Op Note (Signed)
NAME:  [name] [name], [name] [name] NO.:  [id number]   MEDICAL RECORD NO.:  [id number]          PATIENT TYPE:  AMB   LOCATION:  ENDO                         FACILITY:  [facility]   PHYSICIAN:  Waverly Ferrari, M.D.    DATE OF BIRTH:  [birth year]   DATE OF PROCEDURE:  09/25/2004  DATE OF DISCHARGE:                                 OPERATIVE REPORT   PROCEDURE:  Upper endoscopy.   INDICATIONS:  Presumed Hemoccult positivity.  Patient with iron deficiency  anemia   ANESTHESIA:  1.  Demerol 50 mg.  2.  Versed 6 mg.   PROCEDURE:  With patient mildly sedated in the left lateral decubitus  position, the Olympus videoscopic endoscope was inserted in the mouth,  passed under direct vision through the esophagus which appeared normal into  the stomach.  Fundus, body, antrum, duodenal bulb, second portion of  duodenum appeared normal.  From this point, the endoscope was slowly  withdrawn taking circumferential views of duodenal mucosa until the  endoscope had been pulled back into the stomach, placed in retroflexion to  view the stomach from below.  The endoscope was then straightened,  withdrawn, taking circumferential views of the remaining gastric and  esophageal mucosa.  The patient's vital signs, pulse oximeter remained  stable. The patient tolerated procedure well without apparent complications.   FINDINGS:  Unremarkable examination other than a small hiatal hernia.   PLAN:  To proceed to colonoscopy.      GMO/MEDQ  D:  09/25/2004  T:  09/25/2004  Job:  [id number]   cc:   Sherry A. Irven Baltimore, M.D.  [address]  [place]  [place] [address]  Fax: [phone]

## 2010-12-25 NOTE — Discharge Summary (Signed)
[name] [name], [name] [name]           ACCOUNT NO.:  [id number]   MEDICAL RECORD NO.:  [id number]          PATIENT TYPE:  INP   LOCATION:  9311                          FACILITY:  [facility]   PHYSICIAN:  Sherry A. Dickstein, M.D.DATE OF BIRTH:  [birth year]   DATE OF ADMISSION:  07/19/2005  DATE OF DISCHARGE:                                 DISCHARGE SUMMARY   Problem #1.  Fibroid uterus.   SUBJECTIVE:  The patient is a 55 year old, gravida 3, para 2-0-1-2 woman who  has had menorrhagia for several years which has been getting worse.  The  patient has had a known fibroid uterus.  The patient was treated with  Lupron.  However, she had the increased size of her fibroids despite being  on her Lupron. The patient was followed with ultrasound and because of the  increasing size of her uterus and continued bleeding, the patient is brought  to the operating room for hysterectomy.  The patient underwent total  abdominal hysterectomy without difficulty.   PHYSICAL EXAMINATION:  HEENT:  Within normal limits.  NECK:  Without lymphadenopathy. Thyroid without nodules.  CHEST:  Clear to auscultation.  HEART:  Regular rhythm without murmur.  BREASTS:  Without masses.  ABDOMEN:  Obese, soft, nontender without mass.  GU:  External genitalia within normal limits. Cervix within normal limits.  Uterus anteflexed, irregular, approximately 12-week size.  Adnexa without  mass.   HOSPITAL COURSE:  The patient was admitted and brought to the operating room  where total abdominal hysterectomy was performed.  Her preoperative  hematocrit was 33.8, hemoglobin 11.1.  Urinalysis was normal.  Her  preoperative creatinine was 2.8 and potassium 4.4 and sodium 139.  Postoperatively she did well.  She remained afebrile.  Vital signs remained  stable.  Her postoperative hematocrit on her first postoperative day was  28.4 with hemoglobin of 9.6.  The patient is discharged to home on her  second postoperative.   ASSESSMENT:   Stable, status post total abdominal hysterectomy.   PLAN:  Follow up in the office in two to three weeks.  She will call for  temperature greater than 101, heavy bleeding or severe pain.   DISCHARGE MEDICATIONS:  She is discharged to home on Tylox one to two every  four hours p.r.n. or Darvocet-N 100 one to two every four hours p.r.n.      Sherry A. Irven Baltimore, M.D.  Electronically Signed     SAD/MEDQ  D:  07/21/2005  T:  07/21/2005  Job:  [id number]   cc:   Theodoro Parma. Francina Ames., M.D.  Fax: [phone]

## 2010-12-25 NOTE — Assessment & Plan Note (Signed)
[facility]                            CARDIOLOGY OFFICE NOTE   NAME:Sortino, DARELY                    MRN:          [id number]  DATE:10/03/2006                            DOB:          [birth year]    THIS IS NOT THE FORMAL DISCHARGE SUMMARY.  THIS NOTE WAS DICTATED BY  [facility] TO PRESUMABLY FACILITATE POST HOSPITAL FOLLOW-UP STATUS  POST CARDIAC CATHETERIZATION.  THE PATIENT WAS ADMITTED AND DISCHARGED  FROM THE RENAL SERVICE.   DISCHARGE SUMMARY:  The patient was admitted September 26, 2006 and  discharged October 03, 2006.  She has no known drug allergies.   PRINCIPAL DIAGNOSES:  1. Admitted in acute renal failure with symptoms of uremia.  2. Creatinine 2.8 14 months ago, has progressed to creatinine 24 in      the emergency room February 18.  Potassium 5.2.  3. Admission February 18 also including hypertensive urgency with      systolic blood pressure in the 190s.  4. The patient has no previous cardiac history.  5. The patient is status post left heart catheterization February 25      with a finding of normal coronary anatomy and an ejection fraction      of 60%.  Both circumflex and right coronary artery are co-dominant.  6. The patient had a right upper extremity LV fistula placed February      20, as well as a Diatek catheter by the [facility] folks.  The patient      presented outpatient to hemodialysis Tuesday, Thursday and      Saturday.  7. Non-sustained ventricular tachycardia.   SECONDARY DIAGNOSES:  1. History of diabetes.  2. Obesity.  3. Known hypertension.  4. Status post total abdominal hysterectomy without oophorectomy.  5. Colonoscopy and gastroduodenoscopy in the past revealing hiatal      hernia but now intrinsic bleeding.   PROCEDURES:  1. September 28, 2006, creation of right upper extremity AV fistula      Alfredia Client) with implant of Diatek catheter.  2. 2D echocardiogram September 30, 2006, ejection fraction  55-60%, mild      diastolic dysfunction, mild mitral regurgitation.   BRIEF HISTORY:  Ms. Leng is a 55 year old female who presented to  [facility]gent [facility] in [facility] with report of cold symptoms which  had been persistent for the past 2 weeks.  She was reporting nausea,  vomiting, diarrhea with chills.  She also had some swelling in her right  foot.  Laboratory work was done and showed a creatinine of 22.  This was  called to her primary care giver, Dr. Harley Alto.  He requested  that she be admitted to the hospital.   In the emergency room, she was noted to be in hypertensive crisis, with  systolic blood pressure [id number].  A Foley catheter was placed as she was non-  oliguric.  Her creatinine in the emergency department was 24, with a  potassium of 5.2.   Apparently, 1-1/2 years ago she was noted to have some kidney problems  with notable creatinine about 2.8.  She has not followed  up closely with  Dr. Harley Alto.  She also has a history of diabetes and has been  on Glucophage.  This has now been discontinued.  The patient also  complains that she has some problem with taste alterations in food, but  no other complaints.  The plan on admission here at [facility]  February 18 is to consult nephrology, to place her on inpatient  telemetry, to start IV Lasix and treat the poorly controlled  hypertension.  A renal ultrasound will also be planned.   HOSPITAL COURSE:  The patient presenting to [facility] February  18 with hypertensive crisis, non-oliguric acute renal failure with  uremic symptoms.  The patient had an ultrasound of the kidneys which  showed echogenic kidneys bilaterally, no evidence of hydronephrosis.  She was seen by nephrology here at [facility], as well as the  cardiovascular thoracic surgeons.  On February 20, right upper extremity  AV fistula was performed as well as placement of a Diatek catheter.  The  patient  subsequently started hemodialysis.  On her second hemodialysis,  it was noted that she had chest heaviness and cardiology was asked to  consult.  2D echocardiogram was ordered which showed ejection fraction  as mentioned, 55-60%, no wall motion abnormalities, mild mitral  regurgitation.  One Troponin I was ordered and it was 0.02.  The patient  did have persistent recurring chest discomfort and with the presence of  risk factors, including diabetes and hypertensive nephrosclerosis, the  patient was scheduled for cardiac catheterization.  This was done  February 25.  This study showed normal coronary anatomy, ejection  fraction of 60%.  The patient's blood pressure has moderated subsequent  to her admission.  Blood pressure medications have mainly been tapered  off.   DISCHARGE MEDICATIONS:  At the time of discharge, she is tolerating:  1. Metoprolol 50 mg tablets, 1/2 tab in the morning, 1/2 tab in the      evening.  2. Enteric-coated aspirin 81 mg daily.  3. Zocor 20 mg daily at bedtime.  4. She had been on Norvasc 10 mg.  This was tapered down to 2.5 mg      daily at bedtime and subsequently discontinued today, on the day of      discharge.   MOST RECENT LABS:  On discharge February 26, white cells 8.1, hemoglobin  9.8, hematocrit 29.4, platelets 147.  The basic metabolic panel February  26, sodium 132, potassium 4, chloride 100, carbonate 26, glucose 107,  BUN 14, creatinine 6.84.  Urine culture this admission was negative.  Hepatitis panel was negative.  Free T4 was 0.75.   FOLLOW UP:  She has followup appointment post-catheterization as well  blood pressure control on Monday, March 17 at 11:30 with Dr. Domenic Polite.      Sueanne Margarita, PA  Electronically Signed      Deboraha Sprang, MD, [facility]  Electronically Signed   GM/MedQ  DD: 10/04/2006  DT: 10/04/2006  Job #: [id number]

## 2010-12-25 NOTE — Op Note (Signed)
[name], [name] [name]           ACCOUNT NO.:  [id number]   MEDICAL RECORD NO.:  [id number]          PATIENT TYPE:  INP   LOCATION:  5506                         FACILITY:  [facility] [facility]   PHYSICIAN:  Nelda Severe. Kellie Simmering, M.D.  DATE OF BIRTH:  26-Jun-1956   DATE OF PROCEDURE:  09/28/2006  DATE OF DISCHARGE:                               OPERATIVE REPORT   PREOPERATIVE DIAGNOSIS:  End-stage renal disease.   POSTOPERATIVE DIAGNOSIS:  End-stage renal disease.   OPERATION:  1. Creation of right upper arm brachial artery to cephalic vein      arteriovenous fistula (Kauffman shunt).  2. Bilateral ultrasound localization internal jugular veins.  3. Insertion Diatek catheter via right internal jugular vein (24 cm).   SURGEON:  Nelda Severe. Kellie Simmering, M.D.   FIRST ASSISTANT:  Jacinta Shoe, P.A.   ANESTHESIA:  Local.   PROCEDURE:  The patient taken to the operating room, placed in supine  position at which time the right upper extremity was prepped with  Betadine scrub solution and draped in a routine sterile manner.  After  infiltration of 1% Xylocaine, transverse incision was made in the  antecubital area, antecubital vein dissected free.  Cephalic branch was  about 3 to 3.5 mm in size, was dissected free.  There was one hole where  venipunctures had been performed which was repaired with 6-0 Prolene.  It was a satisfactory vein.  Artery was exposed beneath the fascia,  encircled with vessel loops and then occluded proximally and distally  opened with a 15 blade extended with Potts scissors.  Cephalic vein  having been transected, was anastomosed end-to-side with 6-0 Prolene  clamps released.  There was good pulse and thrill in the fistula and  good Doppler flow up to the shoulder.  No protamine was given and no  heparin was given.  Wound was irrigated with saline, closed in layers  with Vicryl in subcuticular fashion.  Sterile dressing applied.  Attention turned to the upper chest and neck  which was exposed and both  internal jugular veins were then imaged using B-mode ultrasound.  Both  noted to be widely patent and normal in appearance.  After prepping and  draping in routine sterile manner, the right internal jugular vein was  entered using a supraclavicular approach.  Guidewire passed into the  right atrium under fluoroscopic guidance.  After dilating the tract  appropriately, 24-cm Diatek catheter positioned through peel-away  sheath, tunneled peripherally, secured with nylon sutures.  The wound  closed with Vicryl in subcuticular fashion.  Sterile dressing applied.  The patient taken to recovery room in satisfactory condition.           ______________________________  Nelda Severe Kellie Simmering, M.D.     JDL/MEDQ  D:  09/28/2006  T:  09/29/2006  Job:  [id number]

## 2010-12-25 NOTE — Cardiovascular Report (Signed)
NAMEMarland Kitchen  IYMONA, NEWFIELD NO.:  [id number]   MEDICAL RECORD NO.:  [id number]          PATIENT TYPE:  INP   LOCATION:  5508                         FACILITY:  [facility]   PHYSICIAN:  Satira Sark, MD DATE OF BIRTH:  07-06-1956   DATE OF PROCEDURE:  DATE OF DISCHARGE:                            CARDIAC CATHETERIZATION   REQUESTING CARDIOLOGIST:  Dr. Virl Axe.   PRIMARY CARDIOLOGIST:  Dr. Jacqulyn Ducking.   INDICATIONS:  Ms. Angelica is a 55 year old woman with a history of  hypertension, type 2 diabetes mellitus and end-stage renal disease,  recently started on hemodialysis.  She has had chest pain associated  with hemodialysis sessions as well as hypotension.  Cardiac markers have  been reassuring, although given her symptomatology and risk factors for  cardiovascular disease, she is referred for a diagnostic cardiac  catheterization to clearly outline the coronary anatomy.  The potential  risks and benefits of this were explained to the patient in advance and  informed consent was obtained.   PROCEDURE PERFORMED:  1. Left heart catheterization  2. Selective coronary angiography.  3. Left ventriculography.   ACCESS AND EQUIPMENT:  The area about the right femoral artery was  anesthetized with 1% lidocaine and a 6-French sheath was placed in the  right femoral artery via the modified Seldinger technique.  Standard  preformed 6-French JL-4, 5-French No-Torque right and 6-French angled  pigtail catheters were used.  All exchanges were made over a wire.  A  total of 135 mL of Omnipaque were used.  The patient tolerated the  procedure well without immediate complications.   HEMODYNAMIC RESULTS:  Aorta 129/70 mmHg.  Left ventricle 129/5 mmHg.   ANGIOGRAPHIC FINDINGS:  1. The left main coronary artery is relatively short, giving rise to      the left anterior descending and circumflex coronary arteries.  No      significant flow-limiting coronary  atherosclerosis is noted.  2. The left anterior descending is a medium- to large-caliber vessel      with 4 small diagonal branches.  There are minor luminal      irregularities noted throughout including some tapering distally,      although no significant flow-limiting coronary atherosclerosis is      noted.  3. The circumflex coronary artery is a relatively large-caliber      vessel, most of which is made up of a large bifurcating obtuse      marginal coming off of a relatively small AV groove branch.  This      heads in the direction of the posterior descending circulation and      is most likely codominant with the right coronary artery.  There      are some minor luminal irregularities, but no significant flow-      limiting coronary atherosclerosis.  4. The right coronary artery is a relatively small- to medium-caliber,      yet codominant vessel.  There are minor luminal irregularities,      although no flow-limiting coronary artery disease is noted.   Left ventriculography was performed in the RAO projection and revealed  an ejection fraction of approximately 60% with no focal anterior or  inferior wall motion abnormality and no significant mitral  regurgitation.   DIAGNOSES:  1. No significant flow-limiting coronary atherosclerosis noted within      the major epicardial vessels.  2. Left ventricular ejection fraction of approximately 60% with no      significant mitral regurgitation, no aortic valve gradient and a      left ventricular end-diastolic pressure of 5 mmHg.   I reviewed the results with the patient and her family.      Satira Sark, MD  Electronically Signed     SGM/MEDQ  D:  10/03/2006  T:  10/04/2006  Job:  [id number]

## 2010-12-25 NOTE — Assessment & Plan Note (Signed)
[facility]                            CARDIOLOGY OFFICE NOTE   NAME:[name], [name] WEARY                  MRN:          [id number]  DATE:10/24/2006                            DOB:          03-22-1956    NEPHROLOGIST:  Dr. Jeneen Rinks Deterding.   REASON FOR VISIT:  Followup cardiac catheterization.   HISTORY OF PRESENT ILLNESS:  [name] [name] is a pleasant 55 year old  woman who was seen in the hospital by Dr. Lattie Haw and subsequently Dr.  Caryl Comes for evaluation of chest pain in the setting of end stage renal  disease being initiated on hemodialysis.  Dr. Caryl Comes ultimately  determined that a diagnostic cardiac catheterization was in order for  clear definition of her coronary anatomy given baseline risk factors  also including hypertension and type 2 diabetes mellitus.  She had  negative cardiac markers during her hospital stay.  She underwent  cardiac catheterization on February 25 which revealed no significant  flow limiting coronary atherosclerosis within the major epicardial  vessels.  Her ejection fraction was 60% without significant mitral  regurgitation, no aortic valve gradient, and a left ventricular end  diastolic pressure of 5 mmHg.  I reviewed this with the patient and  reassured her.  She reports doing well without any active chest pain.  She seems to be tolerating hemodialysis, although she does state that at  times she has been relatively hypotensive at the end of sessions and had  to receive some saline.  Her electrocardiogram today showed sinus  tachycardia at 106 beats per minute, although her heart rate slowed  after being seated into the 60s.  She states that she just recently  finished a hemodialysis session today.   ALLERGIES:  CODEINES.   PRESENT MEDICATIONS:  1. Avandia 4 mg p.o. daily.  2. Nephro-Vite 1 p.o. daily.  3. Metoprolol 25 mg p.o. b.i.d.  4. Simvastatin 20 mg p.o. nightly.  5. Renagel 800 mg 2 tablets p.o. t.i.d.  6. PhosLo 667 mg p.o. t.i.d.  7. Levothyroxine 25 mcg p.o. daily.  8. Aspirin 81 mg p.o. daily.   REVIEW OF SYSTEMS:  As described in the history of present illness.   EXAMINATION:  Blood pressure 124/68, heart rate 68, weight 195 pounds.  The patient is comfortable and in no acute distress denying any active  symptoms.  NECK:  No elevated jugular venous pressure.  LUNGS:  Clear.  CARDIAC:  Regular rate and rhythm, no S3 gallop, no problems with groin  hematoma or residual ecchymosis.   IMPRESSION/RECOMMENDATIONS:  1. No significant flow limiting coronary atherosclerosis noted at      cardiac catheterization in February.  Recommendation would be basic      risk factor modification strategies.  Doubt that additional cardiac      testing is required at this point and she can be seen back by [place]      p.r.n.  2. Otherwise continue regular followup with primary care [name] [name] and      with Nephrology.     Satira Sark, MD  Electronically Signed    SGM/MedQ  DD: 10/24/2006  DT: 10/25/2006  Job #: [id number]   cc:   Joyice Faster. Deterding, M.D.

## 2011-05-19 LAB — RENAL FUNCTION PANEL
BUN: 44 — ABNORMAL HIGH
CO2: 19
Chloride: 101
Creatinine, Ser: 11.62 — ABNORMAL HIGH
GFR calc non Af Amer: 3 — ABNORMAL LOW

## 2011-05-19 LAB — CBC
HCT: 32.4 — ABNORMAL LOW
Hemoglobin: 11 — ABNORMAL LOW
Hemoglobin: 12.9
MCHC: 33.9
MCV: 79.6
RBC: 4.07
RDW: 17.3 — ABNORMAL HIGH
WBC: 15.8 — ABNORMAL HIGH

## 2011-05-19 LAB — DIFFERENTIAL
Eosinophils Absolute: 0
Eosinophils Relative: 0
Lymphs Abs: 0.5 — ABNORMAL LOW
Monocytes Relative: 5

## 2011-05-19 LAB — CULTURE, BLOOD (ROUTINE X 2)

## 2011-05-19 LAB — CATH TIP CULTURE

## 2011-05-20 LAB — CBC
MCHC: 33.8
RBC: 5.01
WBC: 12.1 — ABNORMAL HIGH

## 2011-05-20 LAB — DIFFERENTIAL
Basophils Relative: 0
Monocytes Relative: 8
Neutro Abs: 10 — ABNORMAL HIGH
Neutrophils Relative %: 83 — ABNORMAL HIGH

## 2011-05-20 LAB — I-STAT 8, (EC8 V) (CONVERTED LAB)
Acid-Base Excess: 1
HCT: 45
Hemoglobin: 15.3 — ABNORMAL HIGH
Operator id: 151321
Potassium: 4.5
Sodium: 135
TCO2: 27

## 2011-05-20 LAB — CULTURE, BLOOD (ROUTINE X 2)

## 2011-05-20 LAB — URINALYSIS, ROUTINE W REFLEX MICROSCOPIC
Bilirubin Urine: NEGATIVE
Ketones, ur: NEGATIVE
Nitrite: NEGATIVE
Urobilinogen, UA: 0.2

## 2011-05-20 LAB — POCT I-STAT CREATININE
Creatinine, Ser: 10.1 — ABNORMAL HIGH
Operator id: 151321

## 2011-07-09 ENCOUNTER — Other Ambulatory Visit: Payer: Self-pay | Admitting: Nephrology

## 2011-07-09 ENCOUNTER — Ambulatory Visit
Admission: RE | Admit: 2011-07-09 | Discharge: 2011-07-09 | Disposition: A | Discharge status: Home / Self Care | Payer: Medicare Other | Source: Ambulatory Visit | Attending: Nephrology | Admitting: Nephrology

## 2011-07-09 DIAGNOSIS — R05 Cough: Secondary | ICD-10-CM

## 2011-08-06 ENCOUNTER — Other Ambulatory Visit: Payer: Self-pay | Admitting: Nephrology

## 2011-08-06 DIAGNOSIS — Z1231 Encounter for screening mammogram for malignant neoplasm of breast: Secondary | ICD-10-CM

## 2011-08-11 ENCOUNTER — Ambulatory Visit
Admission: RE | Admit: 2011-08-11 | Discharge: 2011-08-11 | Disposition: A | Discharge status: Home / Self Care | Payer: BC Managed Care – PPO | Source: Ambulatory Visit | Attending: Nephrology | Admitting: Nephrology

## 2011-08-11 DIAGNOSIS — Z1231 Encounter for screening mammogram for malignant neoplasm of breast: Secondary | ICD-10-CM | POA: Diagnosis not present

## 2011-08-16 DIAGNOSIS — Z940 Kidney transplant status: Secondary | ICD-10-CM | POA: Diagnosis not present

## 2011-08-18 DIAGNOSIS — R809 Proteinuria, unspecified: Secondary | ICD-10-CM | POA: Diagnosis not present

## 2011-08-18 DIAGNOSIS — I10 Essential (primary) hypertension: Secondary | ICD-10-CM | POA: Diagnosis not present

## 2011-08-18 DIAGNOSIS — E119 Type 2 diabetes mellitus without complications: Secondary | ICD-10-CM | POA: Diagnosis not present

## 2011-08-18 DIAGNOSIS — Z79899 Other long term (current) drug therapy: Secondary | ICD-10-CM | POA: Diagnosis not present

## 2011-08-18 DIAGNOSIS — Z48298 Encounter for aftercare following other organ transplant: Secondary | ICD-10-CM | POA: Diagnosis not present

## 2011-08-18 DIAGNOSIS — R7989 Other specified abnormal findings of blood chemistry: Secondary | ICD-10-CM | POA: Diagnosis not present

## 2011-08-18 DIAGNOSIS — I129 Hypertensive chronic kidney disease with stage 1 through stage 4 chronic kidney disease, or unspecified chronic kidney disease: Secondary | ICD-10-CM | POA: Diagnosis not present

## 2011-08-18 DIAGNOSIS — Z940 Kidney transplant status: Secondary | ICD-10-CM | POA: Diagnosis not present

## 2011-08-18 DIAGNOSIS — N189 Chronic kidney disease, unspecified: Secondary | ICD-10-CM | POA: Diagnosis not present

## 2011-08-18 DIAGNOSIS — R791 Abnormal coagulation profile: Secondary | ICD-10-CM | POA: Diagnosis not present

## 2011-08-18 DIAGNOSIS — R82998 Other abnormal findings in urine: Secondary | ICD-10-CM | POA: Diagnosis not present

## 2011-08-25 DIAGNOSIS — I129 Hypertensive chronic kidney disease with stage 1 through stage 4 chronic kidney disease, or unspecified chronic kidney disease: Secondary | ICD-10-CM | POA: Diagnosis not present

## 2011-08-25 DIAGNOSIS — Z940 Kidney transplant status: Secondary | ICD-10-CM | POA: Diagnosis not present

## 2011-08-25 DIAGNOSIS — E119 Type 2 diabetes mellitus without complications: Secondary | ICD-10-CM | POA: Diagnosis not present

## 2011-08-25 DIAGNOSIS — N189 Chronic kidney disease, unspecified: Secondary | ICD-10-CM | POA: Diagnosis not present

## 2011-08-26 DIAGNOSIS — I10 Essential (primary) hypertension: Secondary | ICD-10-CM | POA: Diagnosis not present

## 2011-08-26 DIAGNOSIS — T8610 Unspecified complication of kidney transplant: Secondary | ICD-10-CM | POA: Diagnosis not present

## 2011-08-26 DIAGNOSIS — Z940 Kidney transplant status: Secondary | ICD-10-CM | POA: Diagnosis not present

## 2011-08-26 DIAGNOSIS — E1129 Type 2 diabetes mellitus with other diabetic kidney complication: Secondary | ICD-10-CM | POA: Diagnosis not present

## 2011-08-26 DIAGNOSIS — E559 Vitamin D deficiency, unspecified: Secondary | ICD-10-CM | POA: Diagnosis not present

## 2011-08-26 DIAGNOSIS — N179 Acute kidney failure, unspecified: Secondary | ICD-10-CM | POA: Diagnosis not present

## 2011-09-15 DIAGNOSIS — Z940 Kidney transplant status: Secondary | ICD-10-CM | POA: Diagnosis not present

## 2011-09-15 DIAGNOSIS — Z79899 Other long term (current) drug therapy: Secondary | ICD-10-CM | POA: Diagnosis not present

## 2011-09-28 DIAGNOSIS — Z940 Kidney transplant status: Secondary | ICD-10-CM | POA: Diagnosis not present

## 2011-09-28 DIAGNOSIS — I120 Hypertensive chronic kidney disease with stage 5 chronic kidney disease or end stage renal disease: Secondary | ICD-10-CM | POA: Diagnosis not present

## 2011-10-13 DIAGNOSIS — Z79899 Other long term (current) drug therapy: Secondary | ICD-10-CM | POA: Diagnosis not present

## 2011-10-13 DIAGNOSIS — Z940 Kidney transplant status: Secondary | ICD-10-CM | POA: Diagnosis not present

## 2011-11-10 ENCOUNTER — Ambulatory Visit
Admission: RE | Admit: 2011-11-10 | Discharge: 2011-11-10 | Disposition: A | Discharge status: Home / Self Care | Payer: BC Managed Care – PPO | Source: Ambulatory Visit | Attending: Nephrology | Admitting: Nephrology

## 2011-11-10 ENCOUNTER — Other Ambulatory Visit: Payer: Self-pay | Admitting: Nephrology

## 2011-11-10 DIAGNOSIS — Z79899 Other long term (current) drug therapy: Secondary | ICD-10-CM | POA: Diagnosis not present

## 2011-11-10 DIAGNOSIS — N2581 Secondary hyperparathyroidism of renal origin: Secondary | ICD-10-CM | POA: Diagnosis not present

## 2011-11-10 DIAGNOSIS — R05 Cough: Secondary | ICD-10-CM

## 2011-11-10 DIAGNOSIS — Z940 Kidney transplant status: Secondary | ICD-10-CM | POA: Diagnosis not present

## 2011-11-10 DIAGNOSIS — D649 Anemia, unspecified: Secondary | ICD-10-CM | POA: Diagnosis not present

## 2011-11-10 DIAGNOSIS — E119 Type 2 diabetes mellitus without complications: Secondary | ICD-10-CM | POA: Diagnosis not present

## 2011-11-16 DIAGNOSIS — I10 Essential (primary) hypertension: Secondary | ICD-10-CM | POA: Diagnosis not present

## 2011-11-16 DIAGNOSIS — D509 Iron deficiency anemia, unspecified: Secondary | ICD-10-CM | POA: Diagnosis not present

## 2011-11-16 DIAGNOSIS — Z940 Kidney transplant status: Secondary | ICD-10-CM | POA: Diagnosis not present

## 2011-12-08 ENCOUNTER — Encounter (HOSPITAL_COMMUNITY): Payer: Self-pay | Admitting: Family Medicine

## 2011-12-08 ENCOUNTER — Inpatient Hospital Stay (HOSPITAL_COMMUNITY)
Admission: EM | Admit: 2011-12-08 | Discharge: 2011-12-09 | DRG: 690 | Disposition: A | Discharge status: Home / Self Care | Payer: Medicare Other | Attending: Family Medicine | Admitting: Family Medicine

## 2011-12-08 DIAGNOSIS — N390 Urinary tract infection, site not specified: Principal | ICD-10-CM | POA: Diagnosis present

## 2011-12-08 DIAGNOSIS — I120 Hypertensive chronic kidney disease with stage 5 chronic kidney disease or end stage renal disease: Secondary | ICD-10-CM | POA: Diagnosis not present

## 2011-12-08 DIAGNOSIS — R509 Fever, unspecified: Secondary | ICD-10-CM | POA: Diagnosis present

## 2011-12-08 DIAGNOSIS — IMO0002 Reserved for concepts with insufficient information to code with codable children: Secondary | ICD-10-CM

## 2011-12-08 DIAGNOSIS — N179 Acute kidney failure, unspecified: Secondary | ICD-10-CM | POA: Diagnosis present

## 2011-12-08 DIAGNOSIS — N189 Chronic kidney disease, unspecified: Secondary | ICD-10-CM | POA: Diagnosis not present

## 2011-12-08 DIAGNOSIS — R112 Nausea with vomiting, unspecified: Secondary | ICD-10-CM | POA: Diagnosis present

## 2011-12-08 DIAGNOSIS — R7989 Other specified abnormal findings of blood chemistry: Secondary | ICD-10-CM

## 2011-12-08 DIAGNOSIS — D849 Immunodeficiency, unspecified: Secondary | ICD-10-CM | POA: Diagnosis present

## 2011-12-08 DIAGNOSIS — N186 End stage renal disease: Secondary | ICD-10-CM

## 2011-12-08 DIAGNOSIS — R197 Diarrhea, unspecified: Secondary | ICD-10-CM

## 2011-12-08 DIAGNOSIS — T8610 Unspecified complication of kidney transplant: Secondary | ICD-10-CM | POA: Diagnosis present

## 2011-12-08 DIAGNOSIS — E860 Dehydration: Secondary | ICD-10-CM | POA: Diagnosis present

## 2011-12-08 DIAGNOSIS — N3000 Acute cystitis without hematuria: Secondary | ICD-10-CM | POA: Diagnosis not present

## 2011-12-08 DIAGNOSIS — I10 Essential (primary) hypertension: Secondary | ICD-10-CM

## 2011-12-08 DIAGNOSIS — N058 Unspecified nephritic syndrome with other morphologic changes: Secondary | ICD-10-CM | POA: Diagnosis not present

## 2011-12-08 DIAGNOSIS — Z940 Kidney transplant status: Secondary | ICD-10-CM

## 2011-12-08 HISTORY — DX: Essential (primary) hypertension: I10

## 2011-12-08 HISTORY — DX: Kidney transplant status: Z94.0

## 2011-12-08 HISTORY — DX: Chronic kidney disease, unspecified: N18.9

## 2011-12-08 HISTORY — DX: Type 2 diabetes mellitus without complications: E11.9

## 2011-12-08 HISTORY — DX: Gout due to renal impairment, unspecified site: M10.30

## 2011-12-08 HISTORY — DX: Urinary tract infection, site not specified: N39.0

## 2011-12-08 HISTORY — DX: Sickle-cell trait: D57.3

## 2011-12-08 LAB — DIFFERENTIAL
Basophils Absolute: 0 10*3/uL (ref 0.0–0.1)
Basophils Relative: 0 % (ref 0–1)
Monocytes Absolute: 0.9 10*3/uL (ref 0.1–1.0)
Neutro Abs: 7.8 10*3/uL — ABNORMAL HIGH (ref 1.7–7.7)
Neutrophils Relative %: 76 % (ref 43–77)

## 2011-12-08 LAB — COMPREHENSIVE METABOLIC PANEL
ALT: 8 U/L (ref 0–35)
Alkaline Phosphatase: 66 U/L (ref 39–117)
BUN: 41 mg/dL — ABNORMAL HIGH (ref 6–23)
CO2: 18 mEq/L — ABNORMAL LOW (ref 19–32)
GFR calc Af Amer: 18 mL/min — ABNORMAL LOW (ref >90)
GFR calc non Af Amer: 16 mL/min — ABNORMAL LOW (ref >90)
Glucose, Bld: 151 mg/dL — ABNORMAL HIGH (ref 70–99)
Potassium: 4.4 mEq/L (ref 3.5–5.1)
Sodium: 134 mEq/L — ABNORMAL LOW (ref 135–145)
Total Bilirubin: 0.6 mg/dL (ref 0.3–1.2)

## 2011-12-08 LAB — URINE MICROSCOPIC-ADD ON

## 2011-12-08 LAB — URINALYSIS, ROUTINE W REFLEX MICROSCOPIC
Bilirubin Urine: NEGATIVE
Nitrite: POSITIVE — AB
Specific Gravity, Urine: 1.017 (ref 1.005–1.030)
Urobilinogen, UA: 0.2 mg/dL (ref 0.0–1.0)

## 2011-12-08 LAB — CBC
MCHC: 33.2 g/dL (ref 30.0–36.0)
Platelets: 139 10*3/uL — ABNORMAL LOW (ref 150–400)
RDW: 15.1 % (ref 11.5–15.5)

## 2011-12-08 LAB — GLUCOSE, CAPILLARY: Glucose-Capillary: 153 mg/dL — ABNORMAL HIGH (ref 70–99)

## 2011-12-08 MED ORDER — SODIUM CHLORIDE 0.9 % IV SOLN
INTRAVENOUS | Status: DC
  Administered 2011-12-09: 01:00:00 via INTRAVENOUS

## 2011-12-08 MED ORDER — LOSARTAN POTASSIUM 25 MG PO TABS
25.0000 mg | ORAL_TABLET | Freq: Every day | ORAL | Status: DC
  Administered 2011-12-09: 25 mg via ORAL
  Filled 2011-12-08: qty 1

## 2011-12-08 MED ORDER — SODIUM CHLORIDE 0.9 % IV SOLN: INTRAVENOUS | Status: DC

## 2011-12-08 MED ORDER — ASPIRIN EC 81 MG PO TBEC
81.0000 mg | DELAYED_RELEASE_TABLET | Freq: Every day | ORAL | Status: DC
  Administered 2011-12-09: 81 mg via ORAL
  Filled 2011-12-08: qty 1

## 2011-12-08 MED ORDER — METOPROLOL TARTRATE 50 MG PO TABS
50.0000 mg | ORAL_TABLET | Freq: Two times a day (BID) | ORAL | Status: DC
  Administered 2011-12-08 – 2011-12-09 (×2): 50 mg via ORAL
  Filled 2011-12-08 (×3): qty 1

## 2011-12-08 MED ORDER — TACROLIMUS 1 MG PO CAPS
8.0000 mg | ORAL_CAPSULE | Freq: Two times a day (BID) | ORAL | Status: DC
  Administered 2011-12-08 – 2011-12-09 (×2): 8 mg via ORAL
  Filled 2011-12-08 (×3): qty 8

## 2011-12-08 MED ORDER — ONDANSETRON HCL 4 MG/2ML IJ SOLN: 4.0000 mg | Freq: Four times a day (QID) | INTRAMUSCULAR | Status: DC | PRN

## 2011-12-08 MED ORDER — ONDANSETRON HCL 4 MG/2ML IJ SOLN: 4.0000 mg | Freq: Three times a day (TID) | INTRAMUSCULAR | Status: DC | PRN

## 2011-12-08 MED ORDER — INSULIN ASPART 100 UNIT/ML ~~LOC~~ SOLN
0.0000 [IU] | Freq: Three times a day (TID) | SUBCUTANEOUS | Status: DC
  Administered 2011-12-09: 3 [IU] via SUBCUTANEOUS

## 2011-12-08 MED ORDER — HYDROMORPHONE HCL PF 1 MG/ML IJ SOLN: 0.5000 mg | INTRAMUSCULAR | Status: AC | PRN

## 2011-12-08 MED ORDER — DEXTROSE 5 % IV SOLN
1.0000 g | Freq: Once | INTRAVENOUS | Status: AC
  Administered 2011-12-08: 1 g via INTRAVENOUS
  Filled 2011-12-08: qty 10

## 2011-12-08 MED ORDER — MYCOPHENOLATE MOFETIL 250 MG PO CAPS
500.0000 mg | ORAL_CAPSULE | Freq: Two times a day (BID) | ORAL | Status: DC
  Administered 2011-12-08 – 2011-12-09 (×2): 500 mg via ORAL
  Filled 2011-12-08 (×3): qty 2

## 2011-12-08 MED ORDER — ACETAMINOPHEN 650 MG RE SUPP: 650.0000 mg | Freq: Four times a day (QID) | RECTAL | Status: DC | PRN

## 2011-12-08 MED ORDER — DEXTROSE 5 % IV SOLN
1.0000 g | INTRAVENOUS | Status: DC
  Administered 2011-12-09: 1 g via INTRAVENOUS
  Filled 2011-12-08: qty 10

## 2011-12-08 MED ORDER — LEVOTHYROXINE SODIUM 25 MCG PO TABS
25.0000 ug | ORAL_TABLET | Freq: Every day | ORAL | Status: DC
  Administered 2011-12-09: 25 ug via ORAL
  Filled 2011-12-08 (×2): qty 1

## 2011-12-08 MED ORDER — LOPERAMIDE HCL 2 MG PO TABS: 2.0000 mg | ORAL_TABLET | Freq: Four times a day (QID) | ORAL | Status: DC | PRN

## 2011-12-08 MED ORDER — SODIUM CHLORIDE 0.9 % IV BOLUS (SEPSIS)
1000.0000 mL | Freq: Once | INTRAVENOUS | Status: AC
  Administered 2011-12-08: 1000 mL via INTRAVENOUS

## 2011-12-08 MED ORDER — AMLODIPINE BESYLATE 5 MG PO TABS
5.0000 mg | ORAL_TABLET | Freq: Every day | ORAL | Status: DC
  Administered 2011-12-08 – 2011-12-09 (×2): 5 mg via ORAL
  Filled 2011-12-08 (×2): qty 1

## 2011-12-08 MED ORDER — SIMVASTATIN 20 MG PO TABS
20.0000 mg | ORAL_TABLET | ORAL | Status: DC
  Administered 2011-12-08: 20 mg via ORAL
  Filled 2011-12-08: qty 1

## 2011-12-08 MED ORDER — ONDANSETRON HCL 4 MG PO TABS: 4.0000 mg | ORAL_TABLET | Freq: Four times a day (QID) | ORAL | Status: DC | PRN

## 2011-12-08 MED ORDER — ACETAMINOPHEN 325 MG PO TABS: 650.0000 mg | ORAL_TABLET | Freq: Four times a day (QID) | ORAL | Status: DC | PRN

## 2011-12-08 MED ORDER — LEVOTHYROXINE SODIUM 25 MCG PO TABS: 25.0000 ug | ORAL_TABLET | Freq: Every day | ORAL | Status: DC

## 2011-12-08 MED ORDER — LOPERAMIDE HCL 2 MG PO CAPS
2.0000 mg | ORAL_CAPSULE | Freq: Four times a day (QID) | ORAL | Status: DC | PRN
  Filled 2011-12-08: qty 1

## 2011-12-08 MED ORDER — PIOGLITAZONE HCL 15 MG PO TABS
15.0000 mg | ORAL_TABLET | Freq: Every day | ORAL | Status: DC
  Administered 2011-12-09: 15 mg via ORAL
  Filled 2011-12-08 (×2): qty 1

## 2011-12-08 NOTE — ED Provider Notes (Signed)
Medical screening examination/treatment/procedure(s) were conducted as a shared visit with non-physician practitioner(s) and myself.  I personally evaluated the patient during the encounter Pt with kidney transplant and now with UTI and worsening renal function.  Blanchie Dessert, MD 12/08/11 2040

## 2011-12-08 NOTE — H&P (Signed)
History and Physical Examination  Date: 12/08/2011  Patient name: [name] [name] Medical record number: [id number] Date of birth: [birth year] Age: 56 y.o. Gender: female PCP: No primary provider on file.  Chief Complaint:  Chief Complaint  Patient presents with  . Fever  . Diarrhea  . Emesis     History of Present Illness: [name] [name] is an 56 y.o. female with a history of having a renal transplant in 2010 who presented to the emergency department today after 4-5 days of progressive nausea vomiting and fever.  The patient reports that she's had some dysuria.  The patient also reports that she started having fever and chills that progressed over the past 2 days.  She called her nephrologist 2 days ago and was told to come to the emergency department however she waited 2 additional days.  She reports that she started having diarrhea approximately one day prior to admission.  The patient reports that she had fever up to 101.  She also reports that she has been having some difficulty with worsening renal insufficiency and working closely with her nephrologist.  She reports that her creatinine baseline has been approximately 2.9.  She was seen in the emergency department today and found to have an elevated creatinine of 3.1.  She was also found to be dehydrated clinically and had a abnormal urinalysis.  Because she is immunocompromised and having significant nausea and vomiting and hospital admission was requested for further evaluation and management.  Past Medical History Past Medical History  Diagnosis Date  . Hypertension   . Type II diabetes mellitus reported as labile control   . Sickle cell trait   . Gout due to renal impairment 2010    "before finding out I had kidney failure"  . Chronic kidney disease reported baseline cr 2.9     S/P nephrectomy  . UTI (lower urinary tract infection) 12/08/11    "first one ever"    Past Surgical History Past Surgical History  Procedure  Date  . Nephrectomy 2010  . Vaginal hysterectomy 2006  . Tubal ligation 1980's    Home Meds: Prior to Admission medications   Medication Sig Start Date End Date Taking? Authorizing Provider  alendronate (FOSAMAX) 35 MG tablet Take 35 mg by mouth every 7 (seven) days. Wednesday  - Take with a full glass of water on an empty stomach.   Yes Historical Provider, MD  aspirin EC 81 MG tablet Take 81 mg by mouth daily.   Yes Historical Provider, MD  furosemide (LASIX) 40 MG tablet Take 40 mg by mouth daily.   Yes Historical Provider, MD  levothyroxine (SYNTHROID, LEVOTHROID) 25 MCG tablet Take 25 mcg by mouth daily.   Yes Historical Provider, MD  loperamide (IMODIUM A-D) 2 MG tablet Take 2 mg by mouth 4 (four) times daily as needed.   Yes Historical Provider, MD  losartan (COZAAR) 25 MG tablet Take 25 mg by mouth daily.   Yes Historical Provider, MD  metoprolol (LOPRESSOR) 50 MG tablet Take 50 mg by mouth 2 (two) times daily.   Yes Historical Provider, MD  mycophenolate (CELLCEPT) 250 MG capsule Take 500 mg by mouth 2 (two) times daily.   Yes Historical Provider, MD  pioglitazone (ACTOS) 15 MG tablet Take 15 mg by mouth daily.   Yes Historical Provider, MD  simvastatin (ZOCOR) 20 MG tablet Take 20 mg by mouth 3 (three) times a week. mon wed fri   Yes Historical Provider, MD  tacrolimus (PROGRAF) 1  MG capsule Take 8 mg by mouth 2 (two) times daily.   Yes Historical Provider, MD    Allergies: Codeine  Social History:  History   Social History  . Marital Status: Married    Spouse Name: N/A    Number of Children: N/A  . Years of Education: N/A   Occupational History  . Not on file.   Social History Main Topics  . Smoking status: Former Smoker -- 0.5 packs/day for 10 years    Types: Cigarettes    Quit date: 11/07/1988  . Smokeless tobacco: Never Used  . Alcohol Use: No  . Drug Use: No  . Sexually Active: Yes   Other Topics Concern  . Not on file   Social History Narrative  . No  narrative on file   Family History: History reviewed. No pertinent family history.  Review of Systems: Pertinent items are noted in HPI. All other systems reviewed and reported as negative.   Physical Exam: Blood pressure 157/63, pulse 60, temperature 98.6 F (37 C), temperature source Oral, resp. rate 20, SpO2 98.00%. General appearance: alert, cooperative, appears stated age, fatigued and no distress Head: Normocephalic, without obvious abnormality, atraumatic Eyes: negative Nose: Nares normal. Septum midline. Mucosa normal. No drainage or sinus tenderness. Throat: lips, mucosa, and tongue normal; teeth and gums normal Neck: no adenopathy, no carotid bruit, no JVD, supple, symmetrical, trachea midline and thyroid not enlarged, symmetric, no tenderness/mass/nodules Lungs: clear to auscultation bilaterally and normal percussion bilaterally Heart: regular rate and rhythm, S1, S2 normal, no murmur, click, rub or gallop Abdomen: soft, non-tender; bowel sounds normal; no masses,  no organomegaly, mild suprapubic TTP Extremities: extremities normal, atraumatic, no cyanosis or edema Pulses: 2+ and symmetric Skin: Skin color, texture, turgor normal. No rashes or lesions Neurologic: Grossly normal  Lab  And Imaging results:  Results for orders placed during the hospital encounter of 12/08/11 (from the past 24 hour(s))  URINALYSIS, ROUTINE W REFLEX MICROSCOPIC     Status: Abnormal   Collection Time   12/08/11 11:31 AM      Component Value Range   Color, Urine YELLOW  YELLOW    APPearance CLOUDY (*) CLEAR    Specific Gravity, Urine 1.017  1.005 - 1.030    pH 5.5  5.0 - 8.0    Glucose, UA NEGATIVE  NEGATIVE (mg/dL)   Hgb urine dipstick SMALL (*) NEGATIVE    Bilirubin Urine NEGATIVE  NEGATIVE    Ketones, ur NEGATIVE  NEGATIVE (mg/dL)   Protein, ur >300 (*) NEGATIVE (mg/dL)   Urobilinogen, UA 0.2  0.0 - 1.0 (mg/dL)   Nitrite POSITIVE (*) NEGATIVE    Leukocytes, UA MODERATE (*) NEGATIVE     URINE MICROSCOPIC-ADD ON     Status: Abnormal   Collection Time   12/08/11 11:31 AM      Component Value Range   Squamous Epithelial / LPF FEW (*) RARE    WBC, UA 21-50  <3 (WBC/hpf)   RBC / HPF 3-6  <3 (RBC/hpf)   Bacteria, UA MANY (*) RARE    Casts HYALINE CASTS (*) NEGATIVE    Urine-Other MUCOUS PRESENT    COMPREHENSIVE METABOLIC PANEL     Status: Abnormal   Collection Time   12/08/11 11:39 AM      Component Value Range   Sodium 134 (*) 135 - 145 (mEq/L)   Potassium 4.4  3.5 - 5.1 (mEq/L)   Chloride 106  96 - 112 (mEq/L)   CO2 18 (*) 19 -  32 (mEq/L)   Glucose, Bld 151 (*) 70 - 99 (mg/dL)   BUN 41 (*) 6 - 23 (mg/dL)   Creatinine, Ser 3.13 (*) 0.50 - 1.10 (mg/dL)   Calcium 9.1  8.4 - 10.5 (mg/dL)   Total Protein 7.6  6.0 - 8.3 (g/dL)   Albumin 3.3 (*) 3.5 - 5.2 (g/dL)   AST 10  0 - 37 (U/L)   ALT 8  0 - 35 (U/L)   Alkaline Phosphatase 66  39 - 117 (U/L)   Total Bilirubin 0.6  0.3 - 1.2 (mg/dL)   GFR calc non Af Amer 16 (*) >90 (mL/min)   GFR calc Af Amer 18 (*) >90 (mL/min)  CBC     Status: Abnormal   Collection Time   12/08/11 11:39 AM      Component Value Range   WBC 10.2  4.0 - 10.5 (K/uL)   RBC 4.03  3.87 - 5.11 (MIL/uL)   Hemoglobin 9.7 (*) 12.0 - 15.0 (g/dL)   HCT 29.2 (*) 36.0 - 46.0 (%)   MCV 72.5 (*) 78.0 - 100.0 (fL)   MCH 24.1 (*) 26.0 - 34.0 (pg)   MCHC 33.2  30.0 - 36.0 (g/dL)   RDW 15.1  11.5 - 15.5 (%)   Platelets 139 (*) 150 - 400 (K/uL)  DIFFERENTIAL     Status: Abnormal   Collection Time   12/08/11 11:39 AM      Component Value Range   Neutrophils Relative 76  43 - 77 (%)   Neutro Abs 7.8 (*) 1.7 - 7.7 (K/uL)   Lymphocytes Relative 13  12 - 46 (%)   Lymphs Abs 1.4  0.7 - 4.0 (K/uL)   Monocytes Relative 8  3 - 12 (%)   Monocytes Absolute 0.9  0.1 - 1.0 (K/uL)   Eosinophils Relative 2  0 - 5 (%)   Eosinophils Absolute 0.2  0.0 - 0.7 (K/uL)   Basophils Relative 0  0 - 1 (%)   Basophils Absolute 0.0  0.0 - 0.1 (K/uL)     Impression   Significant  UTI (lower urinary tract infection), concerning for pyelonephritis  CRF (chronic renal failure)  AKI (acute kidney injury)  Nausea vomiting and diarrhea  Dehydration  Fever and chills  History of renal transplant  Immunocompromised   Plan  Admit to medical bed for IV antibiotics.  Continue ceftriaxone IV daily.  Culture urine.  Monitor blood glucose and provide supplemental insulin as needed.  Provide medications for nausea and vomiting as needed.  Continue IVF hydration. Follow electrolytes and follow fluid status.  Please see orders and please follow hospital course.    [place], [place] 12/08/2011, 4:19 PM

## 2011-12-08 NOTE — ED Notes (Signed)
Patient resting with NAD at this time. Family at bedside.

## 2011-12-08 NOTE — ED Provider Notes (Signed)
History     CSN: [id number]  Arrival date & time 12/08/11  [id number]   First MD Initiated Contact with Patient 12/08/11 1016      Chief Complaint  Patient presents with  . Fever  . Diarrhea  . Emesis    (Consider location/radiation/quality/duration/timing/severity/associated sxs/prior treatment) HPI History from patient. 56 year old female with history of diabetes, hypertension who is status post renal transplant presents with nausea, vomiting, and diarrhea. She states this started on Monday. She had 2 episodes of vomiting that day, but none since. Emesis is nonbilious, nonbloody. Has had continued episodes of diarrhea, described as loose stools. No blood in the stool or dark stools. She felt as if she was running a fever last evening, and took her temperature; it was 100. She has had no accompanying abdominal pain or chills. Decreased by mouth intake over the past few days. Has had increased frequency of urination and dysuria. She denies chest pain, shortness of breath, diaphoresis.  She is 58yrs s/p renal transplant which was performed at [facility]. Has done well since. Nephrologist is Dr. Marval Regal. States "my creatinine has been up just a little bit lately but the kidney is still working well." Reports last Cr was 2.9.  Past Medical History  Diagnosis Date  . Diabetes mellitus   . Hypertension     Past Surgical History  Procedure Date  . Kidney transplant     History reviewed. No pertinent family history.  History  Substance Use Topics  . Smoking status: Never Smoker   . Smokeless tobacco: Not on file  . Alcohol Use: No    OB History    Grav Para Term Preterm Abortions TAB SAB Ect Mult Living                  Review of Systems  Constitutional: Positive for chills and appetite change. Negative for diaphoresis and fatigue.  HENT: Negative.   Respiratory: Negative for cough, chest tightness and shortness of breath.   Cardiovascular: Negative for chest pain and  palpitations.  Gastrointestinal: Positive for nausea, vomiting and diarrhea. Negative for abdominal pain.  Genitourinary: Positive for dysuria and frequency.  Skin: Negative for color change and rash.  Neurological: Negative for dizziness and weakness.    Allergies  Codeine  Home Medications   Current Outpatient Rx  Name Route Sig Dispense Refill  . ALENDRONATE SODIUM 35 MG PO TABS Oral Take 35 mg by mouth every 7 (seven) days. Wednesday  - Take with a full glass of water on an empty stomach.    . ASPIRIN EC 81 MG PO TBEC Oral Take 81 mg by mouth daily.    . FUROSEMIDE 40 MG PO TABS Oral Take 40 mg by mouth daily.    Marland Kitchen LEVOTHYROXINE SODIUM 25 MCG PO TABS Oral Take 25 mcg by mouth daily.    Marland Kitchen LOPERAMIDE HCL 2 MG PO TABS Oral Take 2 mg by mouth 4 (four) times daily as needed.    Marland Kitchen LOSARTAN POTASSIUM 25 MG PO TABS Oral Take 25 mg by mouth daily.    Marland Kitchen METOPROLOL TARTRATE 50 MG PO TABS Oral Take 50 mg by mouth 2 (two) times daily.    Marland Kitchen MYCOPHENOLATE MOFETIL 250 MG PO CAPS Oral Take 500 mg by mouth 2 (two) times daily.    Marland Kitchen PIOGLITAZONE HCL 15 MG PO TABS Oral Take 15 mg by mouth daily.    Marland Kitchen SIMVASTATIN 20 MG PO TABS Oral Take 20 mg by mouth 3 (three) times a  week. mon wed fri    . TACROLIMUS 1 MG PO CAPS Oral Take 8 mg by mouth 2 (two) times daily.      BP 150/87  Pulse 78  Temp(Src) 98.7 F (37.1 C) (Oral)  Resp 20  SpO2 98%  Physical Exam  Nursing note and vitals reviewed. Constitutional: She is oriented to person, place, and time. She appears well-developed and well-nourished. No distress.  HENT:  Head: Normocephalic and atraumatic.  Eyes: Pupils are equal, round, and reactive to light.  Neck: Normal range of motion.  Cardiovascular: Normal rate, regular rhythm and normal heart sounds.   Pulmonary/Chest: Effort normal and breath sounds normal. She exhibits no tenderness.  Abdominal: Soft. Bowel sounds are normal. There is no tenderness.  Musculoskeletal: Normal range of  motion.  Neurological: She is alert and oriented to person, place, and time.  Skin: Skin is warm and dry. She is not diaphoretic.  Psychiatric: She has a normal mood and affect.    ED Course  Procedures (including critical care time)  Labs Reviewed  COMPREHENSIVE METABOLIC PANEL - Abnormal; Notable for the following:    Sodium 134 (*)    CO2 18 (*)    Glucose, Bld 151 (*)    BUN 41 (*)    Creatinine, Ser 3.13 (*)    Albumin 3.3 (*)    GFR calc non Af Amer 16 (*)    GFR calc Af Amer 18 (*)    All other components within normal limits  CBC - Abnormal; Notable for the following:    Hemoglobin 9.7 (*)    HCT 29.2 (*)    MCV 72.5 (*)    MCH 24.1 (*)    Platelets 139 (*)    All other components within normal limits  DIFFERENTIAL - Abnormal; Notable for the following:    Neutro Abs 7.8 (*)    All other components within normal limits  URINALYSIS, ROUTINE W REFLEX MICROSCOPIC - Abnormal; Notable for the following:    APPearance CLOUDY (*)    Hgb urine dipstick SMALL (*)    Protein, ur >300 (*)    Nitrite POSITIVE (*)    Leukocytes, UA MODERATE (*)    All other components within normal limits  URINE MICROSCOPIC-ADD ON - Abnormal; Notable for the following:    Squamous Epithelial / LPF FEW (*)    Bacteria, UA MANY (*)    Casts HYALINE CASTS (*)    All other components within normal limits  URINE CULTURE   No results found.   1. S/p cadaver renal transplant   2. Urinary tract infection   3. Creatinine elevation       MDM  This 56 year old female who is status post renal transplant presents with nausea, vomiting, diarrhea, and urinary symptoms which started on Monday. Her vitals are stable and she is afebrile here however reports having an elevated temperature to 100 at home. Labs are significant for a likely UTI with positive nitrites and leukocytes. Creatinine also was elevated from her reported previous value at 3.13. Given this, and her immunosuppressed state, we'll  plan to admit for IV antibiotics and supportive care. Patient agreeable with plan.  Discussed with Dr. Wynetta Emery of [facility] at 2:34 PM who accepts pt for admission.      Teagin Christoffel, [place] 12/08/11 1435

## 2011-12-08 NOTE — ED Notes (Signed)
[employer] lunch tray ordered.

## 2011-12-08 NOTE — ED Notes (Signed)
Per pt she has been having N,V,D, chills and fever since yesterday. Pt kidney transplant 3 years ago.

## 2011-12-08 NOTE — ED Notes (Addendum)
Patient states she stated having N/V/D/F x 3 days. Patient denies abdominal pain or general pain at this time. Family at bedside. Patient states she is diabetic and post kidney transplant x 3 years. Transplant performed at [facility].

## 2011-12-09 DIAGNOSIS — N3000 Acute cystitis without hematuria: Secondary | ICD-10-CM

## 2011-12-09 DIAGNOSIS — N058 Unspecified nephritic syndrome with other morphologic changes: Secondary | ICD-10-CM

## 2011-12-09 DIAGNOSIS — I10 Essential (primary) hypertension: Secondary | ICD-10-CM

## 2011-12-09 DIAGNOSIS — R112 Nausea with vomiting, unspecified: Secondary | ICD-10-CM

## 2011-12-09 LAB — COMPREHENSIVE METABOLIC PANEL
ALT: 6 U/L (ref 0–35)
AST: 7 U/L (ref 0–37)
CO2: 15 mEq/L — ABNORMAL LOW (ref 19–32)
Chloride: 110 mEq/L (ref 96–112)
GFR calc Af Amer: 20 mL/min — ABNORMAL LOW (ref >90)
GFR calc non Af Amer: 17 mL/min — ABNORMAL LOW (ref >90)
Glucose, Bld: 154 mg/dL — ABNORMAL HIGH (ref 70–99)
Sodium: 136 mEq/L (ref 135–145)
Total Bilirubin: 0.5 mg/dL (ref 0.3–1.2)

## 2011-12-09 LAB — CBC
MCH: 23.8 pg — ABNORMAL LOW (ref 26.0–34.0)
MCHC: 32.7 g/dL (ref 30.0–36.0)
Platelets: 106 10*3/uL — ABNORMAL LOW (ref 150–400)
RDW: 15.2 % (ref 11.5–15.5)

## 2011-12-09 LAB — HEMOGLOBIN A1C
Hgb A1c MFr Bld: 6.6 % — ABNORMAL HIGH (ref <5.7)
Mean Plasma Glucose: 143 mg/dL — ABNORMAL HIGH (ref <117)

## 2011-12-09 MED ORDER — LEVOFLOXACIN 250 MG PO TABS: 250.0000 mg | ORAL_TABLET | Freq: Every day | ORAL | Status: AC

## 2011-12-09 MED ORDER — FUROSEMIDE 20 MG PO TABS: 20.0000 mg | ORAL_TABLET | ORAL | Status: DC

## 2011-12-09 MED ORDER — AMLODIPINE BESYLATE 5 MG PO TABS: 5.0000 mg | ORAL_TABLET | Freq: Every day | ORAL | Status: DC

## 2011-12-09 NOTE — Discharge Instructions (Signed)
Urinary Tract Infection Infections of the urinary tract can start in several places. A bladder infection (cystitis), a kidney infection (pyelonephritis), and a prostate infection (prostatitis) are different types of urinary tract infections (UTIs). They usually get better if treated with medicines (antibiotics) that kill germs. Take all the medicine until it is gone. You or your child may feel better in a few days, but TAKE ALL MEDICINE or the infection may not respond and may become more difficult to treat. HOME CARE INSTRUCTIONS   Drink enough water and fluids to keep the urine clear or pale yellow. Cranberry juice is especially recommended, in addition to large amounts of water.   Avoid caffeine, tea, and carbonated beverages. They tend to irritate the bladder.   Alcohol may irritate the prostate.   Only take over-the-counter or prescription medicines for pain, discomfort, or fever as directed by your caregiver.  To prevent further infections:  Empty the bladder often. Avoid holding urine for long periods of time.   After a bowel movement, women should cleanse from front to back. Use each tissue only once.   Empty the bladder before and after sexual intercourse.  FINDING OUT THE RESULTS OF YOUR TEST Not all test results are available during your visit. If your or your child's test results are not back during the visit, make an appointment with your caregiver to find out the results. Do not assume everything is normal if you have not heard from your caregiver or the medical facility. It is important for you to follow up on all test results. SEEK MEDICAL CARE IF:   There is back pain.   Your baby is older than 3 months with a rectal temperature of 100.5 F (38.1 C) or higher for more than 1 day.   Your or your child's problems (symptoms) are no better in 3 days. Return sooner if you or your child is getting worse.  SEEK IMMEDIATE MEDICAL CARE IF:   There is severe back pain or lower  abdominal pain.   You or your child develops chills.   You have a fever.   Your baby is older than 3 months with a rectal temperature of 102 F (38.9 C) or higher.   Your baby is 87 months old or younger with a rectal temperature of 100.4 F (38 C) or higher.   There is nausea or vomiting.   There is continued burning or discomfort with urination.  MAKE SURE YOU:   Understand these instructions.   Will watch your condition.   Will get help right away if you are not doing well or get worse.  Document Released: 05/05/2005 Document Revised: 07/15/2011 Document Reviewed: 12/08/2006 [facility] Patient Information 2012 [place].  Preventing Infection Following Kidney Transplant Medications you take to help keep your body from rejecting your transplant may make you less able to fight illness and infection. During the first 3 months after your transplant, you may take higher dosages of your medications because chances of rejection are higher. It's very important to be careful following surgery. Be sure to take all your medicine as directed by your caregiver. It is also important to remember that while the risk of infection and rejection of your new organ may decrease over time, it never goes away completely. That's why you will continue taking your medications, to help prevent rejection. HOME CARE INSTRUCTIONS  Risk of bacterial and fungal infection is greatest in the first few weeks after your surgery so avoid:   Large crowds in  closed spaces.   People with known infections or illnesses.   Wear a surgical mask or similar barrier if you must be in crowds for the first couple months following surgery or as your caregiver suggests.   Taking care of animals.  One of the easiest things you can do to help prevent infection is to wash your hands frequently.  SEEK IMMEDIATE MEDICAL CARE IF:  You develop a fever higher than 100 F (37.8 C) or as defined by your doctor   You have pain  or tenderness around your transplant.   You have a weight gain of more than three pounds in one day. Weigh yourself daily and record your weights. Keep this chart available for your caregiver.   You have swelling around the feet, hands, eyes, or other parts of the body.   A decrease in urine output (passing less water) develops.   You have a burning on urination or more frequent urination.   You have cloudy or very badsmelling urine.   You have blood in your urine.   You have flu-like symptoms (problems).   You have drainage or bad smelling discharge from the wound.  Always be on the lookout for the warning signs. Always tell your caregiver what's going on, even if it does not seem very important. Your transplant team needs to be involved. Document Released: 10/16/2002 Document Revised: 07/15/2011 Document Reviewed: 02/24/2009 [facility] Patient Information 2012 [place].Renal Diet, Pre-Dialysis Chronic kidney disease (CKD) is when the kidneys are not working the way they should. There are 5 stages of kidney disease. Your meal plan will depend on the stage you are diagnosed with. Food can make you healthier and keep your kidneys working well. Use this sheet to help you learn how to eat right to feel right.  A Registered Dietitian can help you with this meal plan. Write down your dietician's name and phone number. HOW DOES FOOD AFFECT MY KIDNEYS? Food gives you energy and helps your body repair itself. Food is broken down in your stomach and intestines. Your blood picks up nutrients from the digested food and carries them to all your body cells. These cells take nutrients from your blood and put waste products back into the bloodstream. When your kidneys were healthy, they constantly removed wastes from your blood. The wastes left your body when you urinated or when you had bowel movements. Now that your kidneys cannot remove wastes from food, you will need to eat fewer foods that cause  waste buildup in the body.  FLUIDS Most people with CKD do not need to restrict fluid. However, if you are retaining fluid in your legs, your caregiver may recommend restricting fluids. Ask and record the amount of fluid you can have each day if instructed by your caregiver. POTASSIUM  Potassium is a mineral found in many foods, especially milk, fruits, and vegetables. It affects how steadily your heart beats. Healthy kidneys keep the right amount of potassium in the blood to keep the heart beating at a steady pace. You may need to restrict potassium. Talk to your caregiver or dietitian to learn more about your potassium needs. If you do need to reduce the potassium in your diet, start by noting the high-potassium foods (below) that you now eat. A dietitian can help you add other foods to the list and tell you how much of the foods below to eat.  High-Potassium Foods  Apricots.   Brussels sprouts.   Dates.   Lima beans.  Oranges.   Prune juice.   Spinach.   Avocados.   Milk.   Figs.   Melons.   Peanuts.   Prunes.   Tomatoes.   Bananas.   Cantaloupe.   Kiwi fruit.   Nectarines.   Asparagus spears.   Raisins.   Winter squash.   Beets.   Clams.   Fruit.   Orange juice.   Potatoes.   Sardines.   Yogurt.  PHOSPHORUS  Phosphorus is a mineral found in many foods. If you have too much phosphorus in your blood, it pulls calcium from your bones. Losing calcium will make your bones weak and likely to break. Also, too much phosphorus may make your skin itch. Avoid foods like milk and cheese, dried beans, peas, colas, nuts, and peanut butter, which are high in phosphorus.   Your needs will depend on your kidney's ability to use phosphorous. Your dietitian can tell you how much of these foods to eat.  PROTEIN  It is important to follow a low-protein diet. A lower protein diet will slow the rate of your kidney failure.   Protein helps you keep muscle and  repair tissue. In your body, the protein you eat breaks down into a waste product called urea. If urea builds up in your blood, you can become very sick.  Ask and record how many servings of meat, fish, chicken, or eggs you may eat per day. Your dietitian may give you a specific number of grams of protein to eat daily.One ounce of meat, fish, chicken, or 1 egg is equal to 7 g. A regular serving size is about the size of the palm of your hand or a deck of cards and is 3 oz in weight and 21 g of protein.  SODIUM  Sodium is found in salt and other foods. Most canned foods and frozen dinners contain large amounts of sodium.   Try to eat fresh foods that are naturally low in sodium. Look for products labeled "low sodium."   Do not use salt substitutes because they contain potassium. Talk to a dietitian about spices you can use to flavor your food. The dietitian can help you find spice blends without sodium or potassium.  VITAMINS AND MINERALS   Take only the vitamins that your caregiver prescribes.   Vitamins and minerals may be missing from your diet because you need to avoid so many foods. Your caregiver may prescribe a vitamin and mineral supplement to meet your needs.  Document Released: 10/16/2002 Document Revised: 07/15/2011 Document Reviewed: 10/23/2010 [facility] Patient Information 2012 [place].  Diabetes Meal Planning Guide The diabetes meal planning guide is a tool to help you plan your meals and snacks. It is important for people with diabetes to manage their blood glucose (sugar) levels. Choosing the right foods and the right amounts throughout your day will help control your blood glucose. Eating right can even help you improve your blood pressure and reach or maintain a healthy weight. CARBOHYDRATE COUNTING MADE EASY When you eat carbohydrates, they turn to sugar. This raises your blood glucose level. Counting carbohydrates can help you control this level so you feel better.  When you plan your meals by counting carbohydrates, you can have more flexibility in what you eat and balance your medicine with your food intake. Carbohydrate counting simply means adding up the total amount of carbohydrate grams in your meals and snacks. Try to eat about the same amount at each meal. Foods with carbohydrates are listed below. Each  portion below is 1 carbohydrate serving or 15 grams of carbohydrates. Ask your dietician how many grams of carbohydrates you should eat at each meal or snack. Grains and Starches  1 slice bread.    English muffin or hotdog/hamburger bun.    cup cold cereal (unsweetened).   ? cup cooked pasta or rice.    cup starchy vegetables (corn, potatoes, peas, beans, winter squash).   1 tortilla (6 inches).    bagel.   1 waffle or pancake (size of a CD).    cup cooked cereal.   4 to 6 small crackers.  *Whole grain is recommended. Fruit  1 cup fresh unsweetened berries, melon, papaya, pineapple.   1 small fresh fruit.    banana or mango.    cup fruit juice (4 oz unsweetened).    cup canned fruit in natural juice or water.   2 tbs dried fruit.   12 to 15 grapes or cherries.  Milk and Yogurt  1 cup fat-free or 1% milk.   1 cup soy milk.   6 oz light yogurt with sugar-free sweetener.   6 oz low-fat soy yogurt.   6 oz plain yogurt.  Vegetables  1 cup raw or  cup cooked is counted as 0 carbohydrates or a "free" food.   If you eat 3 or more servings at 1 meal, count them as 1 carbohydrate serving.  Other Carbohydrates   oz chips or pretzels.    cup ice cream or frozen yogurt.    cup sherbet or sorbet.   2 inch square cake, no frosting.   1 tbs honey, sugar, jam, jelly, or syrup.   2 small cookies.   3 squares of graham crackers.   3 cups popcorn.   6 crackers.   1 cup broth-based soup.   Count 1 cup casserole or other mixed foods as 2 carbohydrate servings.   Foods with less than 20 calories in a  serving may be counted as 0 carbohydrates or a "free" food.  You may want to purchase a book or computer software that lists the carbohydrate gram counts of different foods. In addition, the nutrition facts panel on the labels of the foods you eat are a good source of this information. The label will tell you how big the serving size is and the total number of carbohydrate grams you will be eating per serving. Divide this number by 15 to obtain the number of carbohydrate servings in a portion. Remember, 1 carbohydrate serving equals 15 grams of carbohydrate. SERVING SIZES Measuring foods and serving sizes helps you make sure you are getting the right amount of food. The list below tells how big or small some common serving sizes are.  1 oz.........4 stacked dice.   3 oz........Marland KitchenDeck of cards.   1 tsp.......Marland KitchenTip of little finger.   1 tbs......Marland KitchenMarland KitchenThumb.   2 tbs.......Marland KitchenGolf ball.    cup......Marland KitchenHalf of a fist.   1 cup.......Marland KitchenA fist.  SAMPLE DIABETES MEAL PLAN Below is a sample meal plan that includes foods from the grain and starches, dairy, vegetable, fruit, and meat groups. A dietician can individualize a meal plan to fit your calorie needs and tell you the number of servings needed from each food group. However, controlling the total amount of carbohydrates in your meal or snack is more important than making sure you include all of the food groups at every meal. You may interchange carbohydrate containing foods (dairy, starches, and fruits). The meal plan below is an example of  a 2000 calorie diet using carbohydrate counting. This meal plan has 17 carbohydrate servings. Breakfast  1 cup oatmeal (2 carb servings).    cup light yogurt (1 carb serving).   1 cup blueberries (1 carb serving).    cup almonds.  Snack  1 large apple (2 carb servings).   1 low-fat string cheese stick.  Lunch  Chicken breast salad.   1 cup spinach.    cup chopped tomatoes.   2 oz chicken breast,  sliced.   2 tbs low-fat [place] dressing.   12 whole-wheat crackers (2 carb servings).   12 to 15 grapes (1 carb serving).   1 cup low-fat milk (1 carb serving).  Snack  1 cup carrots.    cup hummus (1 carb serving).  Dinner  3 oz broiled salmon.   1 cup brown rice (3 carb servings).  Snack  1  cups steamed broccoli (1 carb serving) drizzled with 1 tsp olive oil and lemon juice.   1 cup light pudding (2 carb servings).  DIABETES MEAL PLANNING WORKSHEET Your dietician can use this worksheet to help you decide how many servings of foods and what types of foods are right for you.  BREAKFAST Food Group and Servings / Carb Servings Grain/Starches __________________________________ Dairy __________________________________________ Vegetable ______________________________________ Fruit ___________________________________________ Meat __________________________________________ Fat ____________________________________________ LUNCH Food Group and Servings / Carb Servings Grain/Starches ___________________________________ Dairy ___________________________________________ Fruit ____________________________________________ Meat ___________________________________________ Fat _____________________________________________ [name] [name] Food Group and Servings / Carb Servings Grain/Starches ___________________________________ Dairy ___________________________________________ Fruit ____________________________________________ Meat ___________________________________________ Fat _____________________________________________ SNACKS Food Group and Servings / Carb Servings Grain/Starches ___________________________________ Dairy ___________________________________________ Vegetable _______________________________________ Fruit ____________________________________________ Meat ___________________________________________ Fat _____________________________________________ DAILY  TOTALS Starches _________________________ Vegetable ________________________ Fruit ____________________________ Dairy ____________________________ Meat ____________________________ Fat ______________________________ Document Released: 04/22/2005 Document Revised: 07/15/2011 Document Reviewed: 03/03/2009   Blood Sugar Monitoring, Adult GLUCOSE METERS FOR SELF-MONITORING OF BLOOD GLUCOSE  It is important to be able to correctly measure your blood sugar (glucose). You can use a blood glucose monitor (a small battery-operated device) to check your glucose level at any time. This allows you and your caregiver to monitor your diabetes and to determine how well your treatment plan is working. The process of monitoring your blood glucose with a glucose meter is called self-monitoring of blood glucose (SMBG). When people with diabetes control their blood sugar, they have better health. To test for glucose with a typical glucose meter, place the disposable strip in the meter. Then place a small sample of blood on the "test strip." The test strip is coated with chemicals that combine with glucose in blood. The meter measures how much glucose is present. The meter displays the glucose level as a number. Several new models can record and store a number of test results. Some models can connect to personal computers to store test results or print them out.  Newer meters are often easier to use than older models. Some meters allow you to get blood from places other than your fingertip. Some new models have automatic timing, error codes, signals, or barcode readers to help with proper adjustment (calibration). Some meters have a large display screen or spoken instructions for people with visual impairments.  INSTRUCTIONS FOR USING GLUCOSE METERS  Wash your hands with soap and warm water, or clean the area with alcohol. Dry your hands completely.   Prick the side of your fingertip with a lancet (a sharp-pointed  tool used by hand).   Hold the hand down and gently milk the finger until a  small drop of blood appears. Catch the blood with the test strip.   Follow the instructions for inserting the test strip and using the SMBG meter. Most meters require the meter to be turned on and the test strip to be inserted before applying the blood sample.   Record the test result.   Read the instructions carefully for both the meter and the test strips that go with it. Meter instructions are found in the user manual. Keep this manual to help you solve any problems that may arise. Many meters use "error codes" when there is a problem with the meter, the test strip, or the blood sample on the strip. You will need the manual to understand these error codes and fix the problem.   New devices are available such as laser lancets and meters that can test blood taken from "alternative sites" of the body, other than fingertips. However, you should use standard fingertip testing if your glucose changes rapidly. Also, use standard testing if:   You have eaten, exercised, or taken insulin in the past 2 hours.   You think your glucose is low.   You tend to not feel symptoms of low blood glucose (hypoglycemia).   You are ill or under stress.   Clean the meter as directed by the manufacturer.   Test the meter for accuracy as directed by the manufacturer.   Take your meter with you to your caregiver's office. This way, you can test your glucose in front of your caregiver to make sure you are using the meter correctly. Your caregiver can also take a sample of blood to test using a routine lab method. If values on the glucose meter are close to the lab results, you and your caregiver will see that your meter is working well and you are using good technique. Your caregiver will advise you about what to do if the results do not match.  FREQUENCY OF TESTING  Your caregiver will tell you how often you should check your blood  glucose. This will depend on your type of diabetes, your current level of diabetes control, and your types of medicines. The following are general guidelines, but your care plan may be different. Record all your readings and the time of day you took them for review with your caregiver.   Diabetes type 1.   When you are using insulin with good diabetic control (either multiple daily injections or via a pump), you should check your glucose 4 times a day.   If your diabetes is not well controlled, you may need to monitor more frequently, including before meals and 2 hours after meals, at bedtime, and occasionally between 2 a.m. and 3 a.m.   You should always check your glucose before a dose of insulin or before changing the rate on your insulin pump.   Diabetes type 2.   Guidelines for SMBG in diabetes type 2 are not as well defined.   If you are on insulin, follow the guidelines above.   If you are on medicines, but not insulin, and your glucose is not well controlled, you should test at least twice daily.   If you are not on insulin, and your diabetes is controlled with medicines or diet alone, you should test at least once daily, usually before breakfast.   A weekly profile will help your caregiver advise you on your care plan. The week before your visit, check your glucose before a meal and 2 hours after a meal  at least daily. You may want to test before and after a different meal each day so you and your caregiver can tell how well controlled your blood sugars are throughout the course of a 24 hour period.   Gestational diabetes (diabetes during pregnancy).   Frequent testing is often necessary. Accurate timing is important.   If you are not on insulin, check your glucose 4 times a day. Check it before breakfast and 1 hour after the start of each meal.   If you are on insulin, check your glucose 6 times a day. Check it before each meal and 1 hour after the first bite of each meal.    General guidelines.   More frequent testing is required at the start of insulin treatment. Your caregiver will instruct you.   Test your glucose any time you suspect you have low blood sugar (hypoglycemia).   You should test more often when you change medicines, when you have unusual stress or illness, or in other unusual circumstances.  OTHER THINGS TO KNOW ABOUT GLUCOSE METERS  Measurement Range. Most glucose meters are able to read glucose levels over a broad range of values from as low as 0 to as high as 600 mg/dL. If you get an extremely high or low reading from your meter, you should first confirm it with another reading. Report very high or very low readings to your caregiver.   Whole Blood Glucose versus Plasma Glucose. Some older home glucose meters measure glucose in your whole blood. In a lab or when using some newer home glucose meters, the glucose is measured in your plasma (one component of blood). The difference can be important. It is important for you and your caregiver to know whether your meter gives its results as "whole blood equivalent" or "plasma equivalent."   Display of High and Low Glucose Values. Part of learning how to operate a meter is understanding what the meter results mean. Know how high and low glucose concentrations are displayed on your meter.   Factors that Affect Glucose Meter Performance. The accuracy of your test results depends on many factors and varies depending on the brand and type of meter. These factors include:   Low red blood cell count (anemia).   Substances in your blood (such as uric acid, vitamin C, and others).   Environmental factors (temperature, humidity, altitude).   Name-brand versus generic test strips.   Calibration. Make sure your meter is set up properly. It is a good idea to do a calibration test with a control solution recommended by the manufacturer of your meter whenever you begin using a fresh bottle of test strips.  This will help verify the accuracy of your meter.   Improperly stored, expired, or defective test strips. Keep your strips in a dry place with the lid on.   Soiled meter.   Inadequate blood sample.  NEW TECHNOLOGIES FOR GLUCOSE TESTING Alternative site testing Some glucose meters allow testing blood from alternative sites. These include the:  Upper arm.   Forearm.   Base of the thumb.   Thigh.  Sampling blood from alternative sites may be desirable. However, it may have some limitations. Blood in the fingertips show changes in glucose levels more quickly than blood in other parts of the body. This means that alternative site test results may be different from fingertip test results, not because of the meter's ability to test accurately, but because the actual glucose concentration can be different.  Continuous Glucose Monitoring  Devices to measure your blood glucose continuously are available, and others are in development. These methods can be more expensive than self-monitoring with a glucose meter. However, it is uncertain how effective and reliable these devices are. Your caregiver will advise you if this approach makes sense for you. IF BLOOD SUGARS ARE CONTROLLED, PEOPLE WITH DIABETES REMAIN HEALTHIER.  SMBG is an important part of the treatment plan of patients with diabetes mellitus. Below are reasons for using SMBG:   It confirms that your glucose is at a specific, healthy level.   It detects hypoglycemia and severe hyperglycemia.   It allows you and your caregiver to make adjustments in response to changes in lifestyle for individuals requiring medicine.   It determines the need for starting insulin therapy in temporary diabetes that happens during pregnancy (gestational diabetes).  Document Released: 07/29/2003 Document Revised: 07/15/2011 Document Reviewed: 11/19/2010 [facility] Patient Information 2012 [place]. ExitCare Patient Information 2012 ExitCare,  LLCDiarrhea Diarrhea is watery poop (stool). The most common cause of diarrhea is a germ. Other causes include:  Food poisoning.   A reaction to medicine.  HOME CARE   Drink clear fluids. This can stop you from losing too much body fluid (dehydration).   Drink enough fluids to keep your pee (urine) clear or pale yellow.   Avoid solid foods and dairy products until you start to feel better. Then start eating bland foods, such as:   Bananas.   Rice.   Crackers.   Applesauce.   Dry toast.   Avoid spicy foods, caffeine, and alcohol.   Your doctor may give medicine to help with cramps and watery poop. Take this as told. Avoid these medicines if you have a fever or blood in your poop.   Take your medicine as told. Finish them even if you start to feel better.  GET HELP RIGHT AWAY IF:   The watery poop lasts longer than 3 days.   You have a fever.   Your baby is older than 3 months with a rectal temperature of 100.5 F (38.1 C) or higher for more than 1 day.   There is blood in your poop.   You start to throw up (vomit).   You lose too much fluid.  MAKE SURE YOU:   Understand these instructions.   Will watch your condition.   Will get help right away if you are not doing well or get worse.  Document Released: 01/12/2008 Document Revised: 07/15/2011 Document Reviewed: 01/12/2008 [facility] Patient Information 2012 [place].Marland Kitchen

## 2011-12-09 NOTE — Discharge Summary (Signed)
Physician Discharge Summary  Patient ID: [name] [name] MRN: [id number] DOB/AGE: 1956/01/07 56 y.o.  Admit date: 12/08/2011 Discharge date: 12/09/2011  Admission Diagnoses:  Discharge Diagnoses:  Principal Problem:  *UTI (lower urinary tract infection) Active Problems:  CRF (chronic renal failure)  AKI (acute kidney injury)  Nausea vomiting and diarrhea  Dehydration  Fever and chills  History of renal transplant  Immunocompromised   Discharged Condition: good  Hospital Course: Pt was admitted because she was having nausea and vomiting and fever in the setting of a significant UTI and history of renal transplant.  Her creatinine bumped to 3.1 and she was dehydrated.  She was given IV fluids and 2 doses of IV ceftriaxone and responded very well.  Her BP was elevated and amlodipine 5 mg was added. Her creatinine improved to 2.8 prior to discharge.  Her BP also improved.   She was discharged home on lasix 20 mg every other day down from 40 mg daily.  She was given instructions to see her nephrologist in 1 week for follow up of urine culture results (still pending at time of discharge) and to have her BP rechecked and medications evaluated and adjusted as needed.    Consults: Physical Therapy  Significant Diagnostic Studies: microbiology: urine culture: pending at time of discharge :   Pt to follow up with Dr Marval Regal in 1 week to follow up results.    Treatments: IV ceftriaxone and IV fluids  Discharge Exam: Blood pressure 146/76, pulse 81, temperature 98 F (36.7 C), temperature source Oral, resp. rate 18, height 5\' 2"  (1.575 m), weight 98.6 kg (217 lb 6 oz), SpO2 100.00%. Head: Normocephalic, without obvious abnormality, atraumatic Eyes: negative, conjunctivae/corneas clear. PERRL, EOM's intact. Fundi benign. Nose: Nares normal. Septum midline. Mucosa normal. No drainage or sinus tenderness., no discharge Throat: lips, mucosa, and tongue normal; teeth and gums normal Neck: no  adenopathy, no carotid bruit, no JVD, supple, symmetrical, trachea midline and thyroid not enlarged, symmetric, no tenderness/mass/nodules Resp: clear to auscultation bilaterally and normal percussion bilaterally Chest wall: no tenderness Cardio: S1, S2 normal GI: soft, non-tender; bowel sounds normal; no masses,  no organomegaly Extremities: extremities normal, atraumatic, no cyanosis or edema Pulses: 2+ and symmetric Skin: Skin color, texture, turgor normal. No rashes or lesions Neurologic: Alert and oriented X 3, normal strength and tone. Normal symmetric reflexes. Normal coordination and gait  Disposition:   Discharge Orders    Future Orders Please Complete By Expires   Increase activity slowly      Discharge instructions      Comments:   Please see your primary care provider in next week to follow up on urine culture results and recheck blood pressure.     Medication List  As of 12/09/2011  1:12 PM   STOP taking these medications         loperamide 2 MG tablet         TAKE these medications         alendronate 35 MG tablet   Commonly known as: FOSAMAX   Take 35 mg by mouth every 7 (seven) days. Wednesday  - Take with a full glass of water on an empty stomach.      amLODipine 5 MG tablet   Commonly known as: NORVASC   Take 1 tablet (5 mg total) by mouth daily.      aspirin EC 81 MG tablet   Take 81 mg by mouth daily.      furosemide 20 MG  tablet   Commonly known as: LASIX   Take 1 tablet (20 mg total) by mouth every other day.      levofloxacin 250 MG tablet   Commonly known as: LEVAQUIN   Take 1 tablet (250 mg total) by mouth daily.   Start taking on: 12/10/2011      levothyroxine 25 MCG tablet   Commonly known as: SYNTHROID, LEVOTHROID   Take 25 mcg by mouth daily.      losartan 25 MG tablet   Commonly known as: COZAAR   Take 25 mg by mouth daily.      metoprolol 50 MG tablet   Commonly known as: LOPRESSOR   Take 50 mg by mouth 2 (two) times daily.       mycophenolate 250 MG capsule   Commonly known as: CELLCEPT   Take 500 mg by mouth 2 (two) times daily.      pioglitazone 15 MG tablet   Commonly known as: ACTOS   Take 15 mg by mouth daily.      simvastatin 20 MG tablet   Commonly known as: ZOCOR   Take 20 mg by mouth 3 (three) times a week. mon wed fri      tacrolimus 1 MG capsule   Commonly known as: PROGRAF   Take 8 mg by mouth 2 (two) times daily.           Follow-up Information    1 WEEK Follow up with COLADONATO,JOSEPH A, MD. (Follow Up on Urine Culture Results and Recheck Blood Pressure)    Contact information:   [place] [phone]         [place], REVIEWING LABS, TESTS, COUNSELING PT AND PREPARING FOLLOW UP.  Signed:  Havannah Streat 12/09/2011, 1:12 PM

## 2011-12-09 NOTE — Evaluation (Signed)
Physical Therapy Evaluation Patient Details Name: [name] [name] MRN: [id number] DOB: 03/30/1956 Today's Date: 12/09/2011 Time: [id number] PT Time Calculation (min): 30 min  PT Assessment / Plan / Recommendation Clinical Impression  Pt. presents with UTI, AKI, N/V, dehydration  and history of renal transplant.  Pt. is at baseline of ambulation status by pt. report and is independent in ambulation without device.  No further need of PT intervention, however recommended to pt. and nursing staff that pt. ambulate in hallway as much as able.  No goals set and PT will sign off.    PT Assessment  Patent does not need any further PT services    Follow Up Recommendations  No PT follow up    Equipment Recommendations  None recommended by PT    Frequency      Precautions / Restrictions Precautions Precautions: None Restrictions Weight Bearing Restrictions: No   Pertinent Vitals/Pain Pt. Denies pain. No SOB with activity.      Mobility  Bed Mobility Bed Mobility: Supine to Sit Supine to Sit: 7: Independent Transfers Transfers: Sit to Stand;Stand to Sit Sit to Stand: 7: Independent Stand to Sit: 7: Independent Details for Transfer Assistance: safe technique Ambulation/Gait Ambulation/Gait Assistance: 6: Modified independent (Device/Increase time) Ambulation Distance (Feet): 200 Feet Assistive device: None;Other (Comment) (able to manage own IV pole) Ambulation/Gait Assistance Details: no LOB noted, normal gait pattern Gait Pattern: Within Functional Limits Gait velocity: normal Stairs: No    Exercises     PT Goals    Visit Information  Last PT Received On: 12/09/11    Subjective Data  Subjective: I take care of my 57 year old granddaughter Patient Stated Goal: return to normal function   Prior Functioning  Home Living Lives With: Spouse;Family Available Help at Discharge: Available PRN/intermittently;Family Type of Home: Apartment Home Access: Level entry Home  Layout: One level Bathroom Shower/Tub: Public relations account executive: None Prior Function Level of Independence: Independent Driving: Yes Vocation: Retired Corporate investment banker: No difficulties Dominant Hand: Left    Cognition  Overall Cognitive Status: Appears within functional limits for tasks assessed/performed Arousal/Alertness: Awake/alert Orientation Level: Oriented X4 / Intact Behavior During Session: WFL for tasks performed    Extremity/Trunk Assessment Right Upper Extremity Assessment RUE ROM/Strength/Tone: WFL for tasks assessed RUE Coordination: WFL - gross motor Left Upper Extremity Assessment LUE ROM/Strength/Tone: Within functional levels LUE Coordination: WFL - gross motor Right Lower Extremity Assessment RLE ROM/Strength/Tone: Within functional levels RLE Sensation: WFL - Light Touch RLE Coordination: WFL - gross/fine motor Left Lower Extremity Assessment LLE ROM/Strength/Tone: Within functional levels LLE Sensation: WFL - Light Touch LLE Coordination: WFL - gross/fine motor Trunk Assessment Trunk Assessment: Normal   Balance Balance Balance Assessed: No  End of Session PT - End of Session Equipment Utilized During Treatment: Other (comment) (pt. pushed own IV pole once assessed) Activity Tolerance: Patient tolerated treatment well Patient left: in chair;with call bell/phone within reach Nurse Communication: Mobility status   Ladona Ridgel 12/09/2011, 10:27 AM Acute Rehabilitation Services [phone] (pager)

## 2011-12-09 NOTE — Care Management Note (Signed)
    Page 1 of 1   12/09/2011     3:11:12 PM   CARE MANAGEMENT NOTE 12/09/2011  Patient:  TENNELLE, BONICA   Account Number:  [id number]  Date Initiated:  12/09/2011  Documentation initiated by:  Lizabeth Leyden  Subjective/Objective Assessment:   Patient admitted with acute cystitis/pyleonephritis.     Action/Plan:   Progression of care and discharge planning   Anticipated DC Date:  12/13/2011   Anticipated DC Plan:  [place]  CM consult      Choice offered to / List presented to:             Status of service:  Completed, signed off Medicare Important Message given?   (If response is "NO", the following Medicare IM given date fields will be blank) Date Medicare IM given:   Date Additional Medicare IM given:    Discharge Disposition:  HOME/SELF CARE  Per UR Regulation:  Reviewed for med. necessity/level of care/duration of stay  If discussed at [place] of Stay Meetings, dates discussed:    Comments:  12/09/11 Lars Pinks, RN, BSN [phone] PT WAS ADMITTED WITH UTI AND HAS  A HX OF RENAL TRANSPLANT. PTA PT WAS AT HOME WITH SELF/ FAMILY CARE.  PT WOULD LIKE TO RETURN AT DC.  ADP IS FOR DC TO HOME TODAY 12/09/2011 [place], [place] [id number] Utilization review completed.

## 2011-12-09 NOTE — Progress Notes (Signed)
Utilization review completed. Lizabeth Leyden RN, CCM

## 2011-12-09 NOTE — Progress Notes (Signed)
Clinical Social Work Department BRIEF PSYCHOSOCIAL ASSESSMENT 12/09/2011  Patient:  [name] [name], [name] [name]     Account Number:  [id number]     Admit date:  12/08/2011  Clinical Social Worker:  Earlie Server  Date/Time:  12/09/2011 02:00 PM  Referred by:  Physician  Date Referred:  12/09/2011 Referred for  Advanced Directives   Other Referral:   Interview type:  Patient Other interview type:    PSYCHOSOCIAL DATA Living Status:  FAMILY Admitted from facility:   Level of care:   Primary support name:  Kyung Rudd Primary support relationship to patient:  SPOUSE Degree of support available:   Strong    CURRENT CONCERNS Current Concerns  Other - See comment   Other Concerns:   Advanced directives    SOCIAL WORK ASSESSMENT / PLAN CSW received referral to assist with advanced directives. CSW met with patient at bedside. Patient was getting dressed and reported she was ready to dc from the hospital. Per patient, she had advanced directives packet in the past but had not completed them. Patient reports she is not ready to complete packet and would like to review them at home. CSW explained living will, HCPOA and mental health treatment and explained process for getting packet notarized. CSW explained that patient can get notarized outside of the hospital. Patient reported no further concerns. CSW is signing off.   Assessment/plan status:  No Further Intervention Required Other assessment/ plan:   Information/referral to community resources:   Advanced directives    PATIENT'S/FAMILY'S RESPONSE TO PLAN OF CARE: Patient was alert and oriented. Patient agreeable to CSW consult and will follow up after dc.

## 2011-12-09 NOTE — Progress Notes (Signed)
Patient discharge home per MD order.  Patient voiced understanding of all instruction as explained by nurse, copies of all forms given to patient.

## 2011-12-10 LAB — URINE CULTURE
Colony Count: >=100000
Culture  Setup Time: 201305011326

## 2011-12-11 LAB — URINE CULTURE: Culture  Setup Time: 201305020823

## 2011-12-13 DIAGNOSIS — Z940 Kidney transplant status: Secondary | ICD-10-CM | POA: Diagnosis not present

## 2011-12-13 DIAGNOSIS — E039 Hypothyroidism, unspecified: Secondary | ICD-10-CM | POA: Diagnosis not present

## 2011-12-13 DIAGNOSIS — Z79899 Other long term (current) drug therapy: Secondary | ICD-10-CM | POA: Diagnosis not present

## 2011-12-13 DIAGNOSIS — D509 Iron deficiency anemia, unspecified: Secondary | ICD-10-CM | POA: Diagnosis not present

## 2011-12-13 DIAGNOSIS — N179 Acute kidney failure, unspecified: Secondary | ICD-10-CM | POA: Diagnosis not present

## 2011-12-13 DIAGNOSIS — N2581 Secondary hyperparathyroidism of renal origin: Secondary | ICD-10-CM | POA: Diagnosis not present

## 2011-12-13 DIAGNOSIS — T8610 Unspecified complication of kidney transplant: Secondary | ICD-10-CM | POA: Diagnosis not present

## 2011-12-28 DIAGNOSIS — N179 Acute kidney failure, unspecified: Secondary | ICD-10-CM | POA: Diagnosis not present

## 2011-12-28 DIAGNOSIS — D509 Iron deficiency anemia, unspecified: Secondary | ICD-10-CM | POA: Diagnosis not present

## 2011-12-28 DIAGNOSIS — Z940 Kidney transplant status: Secondary | ICD-10-CM | POA: Diagnosis not present

## 2012-01-04 DIAGNOSIS — Z940 Kidney transplant status: Secondary | ICD-10-CM | POA: Diagnosis not present

## 2012-01-04 DIAGNOSIS — Z79899 Other long term (current) drug therapy: Secondary | ICD-10-CM | POA: Diagnosis not present

## 2012-01-19 DIAGNOSIS — Z940 Kidney transplant status: Secondary | ICD-10-CM | POA: Diagnosis not present

## 2012-01-19 DIAGNOSIS — N179 Acute kidney failure, unspecified: Secondary | ICD-10-CM | POA: Diagnosis not present

## 2012-01-19 DIAGNOSIS — T8610 Unspecified complication of kidney transplant: Secondary | ICD-10-CM | POA: Diagnosis not present

## 2012-01-19 DIAGNOSIS — D509 Iron deficiency anemia, unspecified: Secondary | ICD-10-CM | POA: Diagnosis not present

## 2012-01-25 ENCOUNTER — Ambulatory Visit (INDEPENDENT_AMBULATORY_CARE_PROVIDER_SITE_OTHER): Payer: Medicare Other | Admitting: Gastroenterology

## 2012-01-25 ENCOUNTER — Encounter: Payer: Self-pay | Admitting: Gastroenterology

## 2012-01-25 VITALS — BP 136/68 | HR 76 | Ht 63.0 in | Wt 221.4 lb

## 2012-01-25 DIAGNOSIS — R197 Diarrhea, unspecified: Secondary | ICD-10-CM | POA: Diagnosis not present

## 2012-01-25 DIAGNOSIS — K625 Hemorrhage of anus and rectum: Secondary | ICD-10-CM

## 2012-01-25 MED ORDER — MOVIPREP 100 G PO SOLR: 1.0000 | ORAL | Status: DC

## 2012-01-25 NOTE — Patient Instructions (Addendum)
You will be set up for a colonoscopy ([facility]) for intermittent diarrhea, fobt + stool. Immodium is safe even on a daily basis.

## 2012-01-25 NOTE — Progress Notes (Signed)
HPI: This is a  very pleasant 56 year old woman who underwent renal transplant surgery about 3 years ago.  She was in hospital for brief time with UTI.  She had dirrhea around that time and there was noted to be blood in her stool.,   Has intermittent diarrhea for many months, probably years. It has been attributed to some of her immune suppressing medicines. Recently her CellCept was changed to  myfortic which tends to have less diarrhea side effect. She still has diarrhea and will take Imodium about twice a week, 2-3 pills. This will work very effectively.   Hb last month was in 8s, microcytic.  Platelets around 100K  She had a colonoscopy with Dr. Jim Desanctis February 2006 which was normal to the cecum. This was done for iron deficiency anemia. She also had an upper endoscopy February 2006 also with Dr. Jim Desanctis for iron deficiency anemia, Hemoccult positivity. He described a small hiatal hernia otherwise normal examination. He wrote in his note that she was having very heavy menstrual bleeding and recommended gynecologic workup..    Review of systems: Pertinent positive and negative review of systems were noted in the above HPI section. Complete review of systems was performed and was otherwise normal.    Past Medical History  Diagnosis Date  . Hypertension   . Type II diabetes mellitus   . Sickle cell trait   . Gout due to renal impairment 2010    "before finding out I had kidney failure"  . Chronic kidney disease     S/P nephrectomy  . UTI (lower urinary tract infection) 12/08/11    "first one ever"  . History of renal transplant     Past Surgical History  Procedure Date  . Nephrectomy 2010  . Vaginal hysterectomy 2006  . Tubal ligation 1980's    Current Outpatient Prescriptions  Medication Sig Dispense Refill  . alendronate (FOSAMAX) 35 MG tablet Take 35 mg by mouth every 7 (seven) days. Wednesday  - Take with a full glass of water on an empty stomach.      Marland Kitchen  amLODipine (NORVASC) 5 MG tablet Take 1 tablet (5 mg total) by mouth daily.  30 tablet  0  . aspirin EC 81 MG tablet Take 81 mg by mouth daily.      . calcitonin, salmon, (FORTICAL) 200 UNIT/ACT nasal spray Place 1 spray into the nose daily.      . furosemide (LASIX) 20 MG tablet Take 1 tablet (20 mg total) by mouth every other day.  30 tablet  0  . levothyroxine (SYNTHROID, LEVOTHROID) 25 MCG tablet Take 25 mcg by mouth daily.      . metoprolol (LOPRESSOR) 50 MG tablet Take 50 mg by mouth 2 (two) times daily.      . mycophenolate (MYFORTIC) 180 MG EC tablet Take 180 mg by mouth 2 (two) times daily.      . pioglitazone (ACTOS) 15 MG tablet Take 15 mg by mouth daily.      . simvastatin (ZOCOR) 20 MG tablet Take 20 mg by mouth 3 (three) times a week. mon wed fri      . tacrolimus (PROGRAF) 1 MG capsule Take 8 mg by mouth 2 (two) times daily.        Allergies as of 01/25/2012 - Review Complete 01/25/2012  Allergen Reaction Noted  . Codeine Hives 10/07/2008    No family history on file.  History   Social History  . Marital Status: Married  Spouse Name: N/A    Number of Children: N/A  . Years of Education: N/A   Occupational History  . Not on file.   Social History Main Topics  . Smoking status: Former Smoker -- 0.5 packs/day for 10 years    Types: Cigarettes    Quit date: 11/07/1988  . Smokeless tobacco: Never Used  . Alcohol Use: No  . Drug Use: No  . Sexually Active: Yes   Other Topics Concern  . Not on file   Social History Narrative  . No narrative on file       Physical Exam: BP 136/68  Pulse 76  Ht 5\' 3"  (1.6 m)  Wt 221 lb 6.4 oz (100.426 kg)  BMI 39.22 kg/m2 Constitutional: generally well-appearing Psychiatric: alert and oriented x3 Eyes: extraocular movements intact Mouth: oral pharynx moist, no lesions Neck: supple no lymphadenopathy Cardiovascular: heart regular rate and rhythm Lungs: clear to auscultation bilaterally Abdomen: soft, nontender,  nondistended, no obvious ascites, no peritoneal signs, normal bowel sounds Extremities: no lower extremity edema bilaterally Skin: no lesions on visible extremities    Assessment and plan: 56 y.o. female with  Chronic intermittent diarrhea, recent blood in stool.   Will proceed with colonoscopy at her soonest convenience I see no reason for any further blood tests or imaging studies prior to then. She did have a colonoscopy 8 years ago that was normal.

## 2012-01-28 DIAGNOSIS — E119 Type 2 diabetes mellitus without complications: Secondary | ICD-10-CM | POA: Diagnosis not present

## 2012-01-28 DIAGNOSIS — Z48298 Encounter for aftercare following other organ transplant: Secondary | ICD-10-CM | POA: Diagnosis not present

## 2012-01-28 DIAGNOSIS — Z79899 Other long term (current) drug therapy: Secondary | ICD-10-CM | POA: Insufficient documentation

## 2012-01-28 DIAGNOSIS — N189 Chronic kidney disease, unspecified: Secondary | ICD-10-CM | POA: Diagnosis not present

## 2012-01-28 DIAGNOSIS — I129 Hypertensive chronic kidney disease with stage 1 through stage 4 chronic kidney disease, or unspecified chronic kidney disease: Secondary | ICD-10-CM | POA: Diagnosis not present

## 2012-01-28 DIAGNOSIS — Z940 Kidney transplant status: Secondary | ICD-10-CM | POA: Diagnosis not present

## 2012-02-01 ENCOUNTER — Ambulatory Visit (AMBULATORY_SURGERY_CENTER): Payer: Medicare Other | Admitting: Gastroenterology

## 2012-02-01 ENCOUNTER — Encounter: Payer: Self-pay | Admitting: Gastroenterology

## 2012-02-01 VITALS — BP 152/93 | HR 71 | Temp 97.0°F | Resp 21 | Ht 63.0 in | Wt 221.0 lb

## 2012-02-01 DIAGNOSIS — K625 Hemorrhage of anus and rectum: Secondary | ICD-10-CM

## 2012-02-01 DIAGNOSIS — R197 Diarrhea, unspecified: Secondary | ICD-10-CM

## 2012-02-01 DIAGNOSIS — D126 Benign neoplasm of colon, unspecified: Secondary | ICD-10-CM

## 2012-02-01 LAB — GLUCOSE, CAPILLARY: Glucose-Capillary: 123 mg/dL — ABNORMAL HIGH (ref 70–99)

## 2012-02-01 MED ORDER — SODIUM CHLORIDE 0.9 % IV SOLN: 500.0000 mL | INTRAVENOUS | Status: DC

## 2012-02-01 NOTE — Progress Notes (Signed)
PT STATES HAD KIDNEY TRANSPLANT 3 YEARS AGO, DOING WELL, NO FLUID RESTRICTIONS PER PT. EWM

## 2012-02-01 NOTE — Patient Instructions (Addendum)
YOU HAD AN ENDOSCOPIC PROCEDURE TODAY AT THE  ENDOSCOPY CENTER: Refer to the procedure report that was given to you for any specific questions about what was found during the examination.  If the procedure report does not answer your questions, please call your gastroenterologist to clarify.  If you requested that your care partner not be given the details of your procedure findings, then the procedure report has been included in a sealed envelope for you to review at your convenience later.  YOU SHOULD EXPECT: Some feelings of bloating in the abdomen. Passage of more gas than usual.  Walking can help get rid of the air that was put into your GI tract during the procedure and reduce the bloating. If you had a lower endoscopy (such as a colonoscopy or flexible sigmoidoscopy) you may notice spotting of blood in your stool or on the toilet paper. If you underwent a bowel prep for your procedure, then you may not have a normal bowel movement for a few days.  DIET: Your first meal following the procedure should be a light meal and then it is ok to progress to your normal diet.  A half-sandwich or bowl of soup is an example of a good first meal.  Heavy or fried foods are harder to digest and may make you feel nauseous or bloated.  Likewise meals heavy in dairy and vegetables can cause extra gas to form and this can also increase the bloating.  Drink plenty of fluids but you should avoid alcoholic beverages for 24 hours.  ACTIVITY: Your care partner should take you home directly after the procedure.  You should plan to take it easy, moving slowly for the rest of the day.  You can resume normal activity the day after the procedure however you should NOT DRIVE or use heavy machinery for 24 hours (because of the sedation medicines used during the test).    SYMPTOMS TO REPORT IMMEDIATELY: A gastroenterologist can be reached at any hour.  During normal business hours, 8:30 AM to 5:00 PM Monday through Friday,  call [phone].  After hours and on weekends, please call the GI answering service at [phone] who will take a message and have the physician on call contact you.   Following lower endoscopy (colonoscopy or flexible sigmoidoscopy):  Excessive amounts of blood in the stool  Significant tenderness or worsening of abdominal pains  Swelling of the abdomen that is new, acute  Fever of 100F or higher  Following upper endoscopy (EGD)  Vomiting of blood or coffee ground material  New chest pain or pain under the shoulder blades  Painful or persistently difficult swallowing  New shortness of breath  Fever of 100F or higher  Black, tarry-looking stools  FOLLOW UP: If any biopsies were taken you will be contacted by phone or by letter within the next 1-3 weeks.  Call your gastroenterologist if you have not heard about the biopsies in 3 weeks.  Our staff will call the home number listed on your records the next business day following your procedure to check on you and address any questions or concerns that you may have at that time regarding the information given to you following your procedure. This is a courtesy call and so if there is no answer at the home number and we have not heard from you through the emergency physician on call, we will assume that you have returned to your regular daily activities without incident.  SIGNATURES/CONFIDENTIALITY: You and/or your care   partner have signed paperwork which will be entered into your electronic medical record.  These signatures attest to the fact that that the information above on your After Visit Summary has been reviewed and is understood.  Full responsibility of the confidentiality of this discharge information lies with you and/or your care-partner. YOU HAD AN ENDOSCOPIC PROCEDURE TODAY AT [facility]: Refer to the procedure report that was given to you for any specific questions about what was found during the  examination.  If the procedure report does not answer your questions, please call your gastroenterologist to clarify.  If you requested that your care partner not be given the details of your procedure findings, then the procedure report has been included in a sealed envelope for you to review at your convenience later.  YOU SHOULD EXPECT: Some feelings of bloating in the abdomen. Passage of more gas than usual.  Walking can help get rid of the air that was put into your GI tract during the procedure and reduce the bloating. If you had a lower endoscopy (such as a colonoscopy or flexible sigmoidoscopy) you may notice spotting of blood in your stool or on the toilet paper. If you underwent a bowel prep for your procedure, then you may not have a normal bowel movement for a few days.  DIET: Your first meal following the procedure should be a light meal and then it is ok to progress to your normal diet.  A half-sandwich or bowl of soup is an example of a good first meal.  Heavy or fried foods are harder to digest and may make you feel nauseous or bloated.  Likewise meals heavy in dairy and vegetables can cause extra gas to form and this can also increase the bloating.  Drink plenty of fluids but you should avoid alcoholic beverages for 24 hours.  ACTIVITY: Your care partner should take you home directly after the procedure.  You should plan to take it easy, moving slowly for the rest of the day.  You can resume normal activity the day after the procedure however you should NOT DRIVE or use heavy machinery for 24 hours (because of the sedation medicines used during the test).    SYMPTOMS TO REPORT IMMEDIATELY: A gastroenterologist can be reached at any hour.  During normal business hours, 8:30 AM to 5:00 PM Monday through Friday, call [phone].  After hours and on weekends, please call the GI answering service at [phone] who will take a message and have the physician on call contact  you.   Following lower endoscopy (colonoscopy or flexible sigmoidoscopy):  Excessive amounts of blood in the stool  Significant tenderness or worsening of abdominal pains  Swelling of the abdomen that is new, acute  Fever of 100F or higher  Following upper endoscopy (EGD)  Vomiting of blood or coffee ground material  New chest pain or pain under the shoulder blades  Painful or persistently difficult swallowing  New shortness of breath  Fever of 100F or higher  Black, tarry-looking stools  FOLLOW UP: If any biopsies were taken you will be contacted by phone or by letter within the next 1-3 weeks.  Call your gastroenterologist if you have not heard about the biopsies in 3 weeks.  Our staff will call the home number listed on your records the next business day following your procedure to check on you and address any questions or concerns that you may have at that time regarding the information given to  you following your procedure. This is a courtesy call and so if there is no answer at the home number and we have not heard from you through the emergency physician on call, we will assume that you have returned to your regular daily activities without incident.  SIGNATURES/CONFIDENTIALITY: You and/or your care partner have signed paperwork which will be entered into your electronic medical record.  These signatures attest to the fact that that the information above on your After Visit Summary has been reviewed and is understood.  Full responsibility of the confidentiality of this discharge information lies with you and/or your care-partner. YOU HAD AN ENDOSCOPIC PROCEDURE TODAY AT [facility]: Refer to the procedure report that was given to you for any specific questions about what was found during the examination.  If the procedure report does not answer your questions, please call your gastroenterologist to clarify.  If you requested that your care partner not be given the  details of your procedure findings, then the procedure report has been included in a sealed envelope for you to review at your convenience later.  YOU SHOULD EXPECT: Some feelings of bloating in the abdomen. Passage of more gas than usual.  Walking can help get rid of the air that was put into your GI tract during the procedure and reduce the bloating. If you had a lower endoscopy (such as a colonoscopy or flexible sigmoidoscopy) you may notice spotting of blood in your stool or on the toilet paper. If you underwent a bowel prep for your procedure, then you may not have a normal bowel movement for a few days.  DIET: Your first meal following the procedure should be a light meal and then it is ok to progress to your normal diet.  A half-sandwich or bowl of soup is an example of a good first meal.  Heavy or fried foods are harder to digest and may make you feel nauseous or bloated.  Likewise meals heavy in dairy and vegetables can cause extra gas to form and this can also increase the bloating.  Drink plenty of fluids but you should avoid alcoholic beverages for 24 hours.  ACTIVITY: Your care partner should take you home directly after the procedure.  You should plan to take it easy, moving slowly for the rest of the day.  You can resume normal activity the day after the procedure however you should NOT DRIVE or use heavy machinery for 24 hours (because of the sedation medicines used during the test).    SYMPTOMS TO REPORT IMMEDIATELY: A gastroenterologist can be reached at any hour.  During normal business hours, 8:30 AM to 5:00 PM Monday through Friday, call [phone].  After hours and on weekends, please call the GI answering service at [phone] who will take a message and have the physician on call contact you.   Following lower endoscopy (colonoscopy or flexible sigmoidoscopy):  Excessive amounts of blood in the stool  Significant tenderness or worsening of abdominal pains  Swelling of  the abdomen that is new, acute  Fever of 100F or higher  Following upper endoscopy (EGD)  Vomiting of blood or coffee ground material  New chest pain or pain under the shoulder blades  Painful or persistently difficult swallowing  New shortness of breath  Fever of 100F or higher  Black, tarry-looking stools  FOLLOW UP: If any biopsies were taken you will be contacted by phone or by letter within the next 1-3 weeks.  Call your  gastroenterologist if you have not heard about the biopsies in 3 weeks.  Our staff will call the home number listed on your records the next business day following your procedure to check on you and address any questions or concerns that you may have at that time regarding the information given to you following your procedure. This is a courtesy call and so if there is no answer at the home number and we have not heard from you through the emergency physician on call, we will assume that you have returned to your regular daily activities without incident.  SIGNATURES/CONFIDENTIALITY: You and/or your care partner have signed paperwork which will be entered into your electronic medical record.  These signatures attest to the fact that that the information above on your After Visit Summary has been reviewed and is understood.  Full responsibility of the confidentiality of this discharge information lies with you and/or your care-partner.

## 2012-02-01 NOTE — Op Note (Signed)
[facility]. [place], [place]  [address]  COLONOSCOPY PROCEDURE REPORT  PATIENT:  [name] [name], [name] [name]  MR#:  [id number] BIRTHDATE:  1955-09-18, 28 yrs. old  GENDER:  female ENDOSCOPIST:  Milus Banister, MD REF. BY:  Donetta Potts, M.D. PROCEDURE DATE:  02/01/2012 PROCEDURE:  Colonoscopy with biopsy ASA CLASS:  Class III INDICATIONS:  intermittent diarrhea MEDICATIONS:   Fentanyl 50 mcg IV, These medications were titrated to patient response per physician's verbal order, Versed 7 mg IV  DESCRIPTION OF PROCEDURE:   After the risks benefits and alternatives of the procedure were thoroughly explained, informed consent was obtained.  Digital rectal exam was performed and revealed no rectal masses.   The LB [id number]-[id number] endoscope was introduced through the anus and advanced to the terminal ileum which was intubated for a short distance, without limitations. The quality of the prep was good..  The instrument was then slowly withdrawn as the colon was fully examined. <<PROCEDUREIMAGES>> FINDINGS:  The terminal ileum appeared normal (see image3).  A normal appearing cecum, ileocecal valve, and appendiceal orifice were identified. The ascending, hepatic flexure, transverse, splenic flexure, descending, sigmoid colon, and rectum appeared unremarkable (see image1, image2, and image4).   Normal appearing colon mucosa was biopsied to check for microscopic colitis. Retroflexed views in the rectum revealed no abnormalities. COMPLICATIONS:  None  ENDOSCOPIC IMPRESSION: 1) Normal terminal ileum 2) Normal colon; no polyps or cancers.  Colon was biopsied to check for microscopic colitis.  RECOMMENDATIONS: 1) You should continue to follow colorectal cancer screening guidelines for "routine risk" patients with a repeat colonoscopy in 10 years. There is no need for FOBT (stool) testing for at least 5 years.  REPEAT EXAM:10  years  ______________________________ Milus Banister, MD  n. eSIGNED:   Milus Banister at 02/01/2012 11:14 AM  Tia Masker, [id number]

## 2012-02-01 NOTE — Progress Notes (Signed)
Patient did not experience any of the following events: a burn prior to discharge; a fall within the facility; wrong site/side/patient/procedure/implant event; or a hospital transfer or hospital admission upon discharge from the facility. (G8907) Patient did not have preoperative order for IV antibiotic SSI prophylaxis. (G8918)  

## 2012-02-02 ENCOUNTER — Telehealth: Payer: Self-pay | Admitting: *Deleted

## 2012-02-02 NOTE — Telephone Encounter (Signed)
  Follow up Call-  Call back number 02/01/2012  Post procedure Call Back phone  # [phone]  Permission to leave phone message Yes     Patient questions:  Do you have a fever, pain , or abdominal swelling? no Pain Score  0 *  Have you tolerated food without any problems? yes  Have you been able to return to your normal activities? yes  Do you have any questions about your discharge instructions: Diet   no Medications  no Follow up visit  no  Do you have questions or concerns about your Care? no  Actions: * If pain score is 4 or above: No action needed, pain <4.

## 2012-02-04 ENCOUNTER — Other Ambulatory Visit: Payer: Self-pay

## 2012-02-08 ENCOUNTER — Encounter: Payer: Self-pay | Admitting: Gastroenterology

## 2012-02-16 DIAGNOSIS — I120 Hypertensive chronic kidney disease with stage 5 chronic kidney disease or end stage renal disease: Secondary | ICD-10-CM | POA: Diagnosis not present

## 2012-02-16 DIAGNOSIS — Z940 Kidney transplant status: Secondary | ICD-10-CM | POA: Diagnosis not present

## 2012-02-16 DIAGNOSIS — Z79899 Other long term (current) drug therapy: Secondary | ICD-10-CM | POA: Diagnosis not present

## 2012-02-16 DIAGNOSIS — N179 Acute kidney failure, unspecified: Secondary | ICD-10-CM | POA: Diagnosis not present

## 2012-02-17 ENCOUNTER — Telehealth: Payer: Self-pay | Admitting: Internal Medicine

## 2012-02-17 NOTE — Telephone Encounter (Signed)
error 

## 2012-03-23 DIAGNOSIS — D509 Iron deficiency anemia, unspecified: Secondary | ICD-10-CM | POA: Diagnosis not present

## 2012-03-23 DIAGNOSIS — I120 Hypertensive chronic kidney disease with stage 5 chronic kidney disease or end stage renal disease: Secondary | ICD-10-CM | POA: Diagnosis not present

## 2012-03-23 DIAGNOSIS — T8610 Unspecified complication of kidney transplant: Secondary | ICD-10-CM | POA: Diagnosis not present

## 2012-03-23 DIAGNOSIS — D649 Anemia, unspecified: Secondary | ICD-10-CM | POA: Diagnosis not present

## 2012-03-31 DIAGNOSIS — I120 Hypertensive chronic kidney disease with stage 5 chronic kidney disease or end stage renal disease: Secondary | ICD-10-CM | POA: Diagnosis not present

## 2012-03-31 DIAGNOSIS — Z940 Kidney transplant status: Secondary | ICD-10-CM | POA: Diagnosis not present

## 2012-03-31 DIAGNOSIS — Z79899 Other long term (current) drug therapy: Secondary | ICD-10-CM | POA: Diagnosis not present

## 2012-04-14 DIAGNOSIS — I120 Hypertensive chronic kidney disease with stage 5 chronic kidney disease or end stage renal disease: Secondary | ICD-10-CM | POA: Diagnosis not present

## 2012-05-08 DIAGNOSIS — E7800 Pure hypercholesterolemia, unspecified: Secondary | ICD-10-CM | POA: Diagnosis not present

## 2012-05-08 DIAGNOSIS — E039 Hypothyroidism, unspecified: Secondary | ICD-10-CM | POA: Diagnosis not present

## 2012-05-08 DIAGNOSIS — I10 Essential (primary) hypertension: Secondary | ICD-10-CM | POA: Diagnosis not present

## 2012-05-16 DIAGNOSIS — E039 Hypothyroidism, unspecified: Secondary | ICD-10-CM | POA: Diagnosis not present

## 2012-05-16 DIAGNOSIS — E119 Type 2 diabetes mellitus without complications: Secondary | ICD-10-CM | POA: Diagnosis not present

## 2012-05-16 DIAGNOSIS — Z79899 Other long term (current) drug therapy: Secondary | ICD-10-CM | POA: Diagnosis not present

## 2012-05-16 DIAGNOSIS — E785 Hyperlipidemia, unspecified: Secondary | ICD-10-CM | POA: Diagnosis not present

## 2012-05-16 DIAGNOSIS — Z940 Kidney transplant status: Secondary | ICD-10-CM | POA: Diagnosis not present

## 2012-06-05 DIAGNOSIS — E7800 Pure hypercholesterolemia, unspecified: Secondary | ICD-10-CM | POA: Diagnosis not present

## 2012-06-05 DIAGNOSIS — Z23 Encounter for immunization: Secondary | ICD-10-CM | POA: Diagnosis not present

## 2012-06-05 DIAGNOSIS — E039 Hypothyroidism, unspecified: Secondary | ICD-10-CM | POA: Diagnosis not present

## 2012-06-05 DIAGNOSIS — I10 Essential (primary) hypertension: Secondary | ICD-10-CM | POA: Diagnosis not present

## 2012-06-28 DIAGNOSIS — E785 Hyperlipidemia, unspecified: Secondary | ICD-10-CM | POA: Diagnosis not present

## 2012-06-28 DIAGNOSIS — E039 Hypothyroidism, unspecified: Secondary | ICD-10-CM | POA: Diagnosis not present

## 2012-06-28 DIAGNOSIS — Z79899 Other long term (current) drug therapy: Secondary | ICD-10-CM | POA: Diagnosis not present

## 2012-06-28 DIAGNOSIS — Z940 Kidney transplant status: Secondary | ICD-10-CM | POA: Diagnosis not present

## 2012-06-28 DIAGNOSIS — E119 Type 2 diabetes mellitus without complications: Secondary | ICD-10-CM | POA: Diagnosis not present

## 2012-06-29 DIAGNOSIS — I10 Essential (primary) hypertension: Secondary | ICD-10-CM | POA: Diagnosis not present

## 2012-06-29 DIAGNOSIS — D509 Iron deficiency anemia, unspecified: Secondary | ICD-10-CM | POA: Diagnosis not present

## 2012-06-29 DIAGNOSIS — Z940 Kidney transplant status: Secondary | ICD-10-CM | POA: Diagnosis not present

## 2012-06-29 DIAGNOSIS — T8610 Unspecified complication of kidney transplant: Secondary | ICD-10-CM | POA: Diagnosis not present

## 2012-07-13 ENCOUNTER — Other Ambulatory Visit (HOSPITAL_COMMUNITY): Payer: Self-pay | Admitting: *Deleted

## 2012-07-13 ENCOUNTER — Other Ambulatory Visit (HOSPITAL_COMMUNITY): Payer: Self-pay

## 2012-07-14 ENCOUNTER — Encounter (HOSPITAL_COMMUNITY): Payer: Medicare Other

## 2012-07-17 ENCOUNTER — Emergency Department (INDEPENDENT_AMBULATORY_CARE_PROVIDER_SITE_OTHER)
Admission: EM | Admit: 2012-07-17 | Discharge: 2012-07-17 | Disposition: A | Discharge status: Home / Self Care | Payer: Medicare Other | Source: Home / Self Care | Attending: Family Medicine | Admitting: Family Medicine

## 2012-07-17 ENCOUNTER — Encounter (HOSPITAL_COMMUNITY): Payer: Self-pay

## 2012-07-17 DIAGNOSIS — B009 Herpesviral infection, unspecified: Secondary | ICD-10-CM

## 2012-07-17 MED ORDER — VALACYCLOVIR HCL 1 G PO TABS: 1000.0000 mg | ORAL_TABLET | Freq: Three times a day (TID) | ORAL | Status: DC

## 2012-07-17 NOTE — ED Provider Notes (Signed)
History     CSN: [id number]  Arrival date & time 07/17/12  1339   First MD Initiated Contact with Patient 07/17/12 1505      Chief Complaint  Patient presents with  . Rash    (Consider location/radiation/quality/duration/timing/severity/associated sxs/prior treatment) Patient is a 56 y.o. female presenting with rash. The history is provided by the patient.  Rash  This is a new problem. The current episode started more than 1 week ago. The problem has been gradually worsening. The problem is associated with an unknown factor. There has been no fever. The rash is present on the lips and face. The pain is mild. Associated symptoms include blisters and pain.    Past Medical History  Diagnosis Date  . Hypertension   . Type II diabetes mellitus   . Sickle cell trait   . Gout due to renal impairment 2010    "before finding out I had kidney failure"  . UTI (lower urinary tract infection) 12/08/11    "first one ever"  . History of renal transplant   . Blood transfusion   . Hyperlipidemia   . Chronic kidney disease     s/p transplant, kidney wasn't removed  . Thyroid disease     Past Surgical History  Procedure Date  . Vaginal hysterectomy 2006  . Tubal ligation 1980's  . Kidney transplant   . Parathyroidectomy     Family History  Problem Relation Age of Onset  . Heart disease Father   . Colon cancer Neg Hx   . Esophageal cancer Neg Hx   . Rectal cancer Neg Hx   . Stomach cancer Neg Hx     History  Substance Use Topics  . Smoking status: Former Smoker -- 0.5 packs/day for 10 years    Types: Cigarettes    Quit date: 11/07/1988  . Smokeless tobacco: Never Used  . Alcohol Use: No    OB History    Grav Para Term Preterm Abortions TAB SAB Ect Mult Living                  Review of Systems  Constitutional: Negative.   HENT: Negative.   Skin: Positive for rash.    Allergies  Codeine  Home Medications   Current Outpatient Rx  Name  Route  Sig  Dispense   Refill  . ALENDRONATE SODIUM 35 MG PO TABS   Oral   Take 35 mg by mouth every 7 (seven) days. Wednesday  - Take with a full glass of water on an empty stomach.         . AMLODIPINE BESYLATE 5 MG PO TABS   Oral   Take 1 tablet (5 mg total) by mouth daily.   30 tablet   0   . ASPIRIN EC 81 MG PO TBEC   Oral   Take 81 mg by mouth daily.         . FUROSEMIDE 20 MG PO TABS   Oral   Take 1 tablet (20 mg total) by mouth every other day.   30 tablet   0   . LEVOTHYROXINE SODIUM 25 MCG PO TABS   Oral   Take 25 mcg by mouth daily.         Marland Kitchen METOPROLOL TARTRATE 50 MG PO TABS   Oral   Take 50 mg by mouth 2 (two) times daily.         Marland Kitchen MYCOPHENOLATE SODIUM 180 MG PO TBEC   Oral   Take  180 mg by mouth 2 (two) times daily.         Marland Kitchen SIMVASTATIN 20 MG PO TABS   Oral   Take 20 mg by mouth 3 (three) times a week. mon wed fri         . TACROLIMUS 1 MG PO CAPS   Oral   Take 8 mg by mouth 2 (two) times daily.         Marland Kitchen PIOGLITAZONE HCL 15 MG PO TABS   Oral   Take 15 mg by mouth daily.         Marland Kitchen VALACYCLOVIR HCL 1 G PO TABS   Oral   Take 1 tablet (1,000 mg total) by mouth 3 (three) times daily.   21 tablet   0     BP 130/83  Temp 99.6 F (37.6 C) (Oral)  Resp 22  SpO2 100%  Physical Exam  Nursing note and vitals reviewed. Constitutional: She is oriented to person, place, and time. She appears well-developed and well-nourished.  HENT:  Head: Normocephalic.  Right Ear: External ear normal.  Left Ear: External ear normal.  Mouth/Throat: Oropharynx is clear and moist.  Eyes: Conjunctivae normal are normal. Pupils are equal, round, and reactive to light.  Neck: Normal range of motion. Neck supple.  Neurological: She is alert and oriented to person, place, and time.  Skin: Skin is warm and dry. Rash noted.       Crusting vesicular lesions on upper lip, right side of nose , cheek and right neck, lesions stop at midline, nonpustular.    ED Course   Procedures (including critical care time)  Labs Reviewed - No data to display No results found.   1. Herpes simplex infection of skin       MDM          Billy Fischer, MD 07/17/12 [phone]

## 2012-07-17 NOTE — ED Notes (Signed)
Reported  Eruption nose, right side of face; burning sensation; transplant pt

## 2012-07-25 DIAGNOSIS — E119 Type 2 diabetes mellitus without complications: Secondary | ICD-10-CM | POA: Diagnosis not present

## 2012-07-25 DIAGNOSIS — Z79899 Other long term (current) drug therapy: Secondary | ICD-10-CM | POA: Diagnosis not present

## 2012-07-25 DIAGNOSIS — E039 Hypothyroidism, unspecified: Secondary | ICD-10-CM | POA: Diagnosis not present

## 2012-07-25 DIAGNOSIS — Z940 Kidney transplant status: Secondary | ICD-10-CM | POA: Diagnosis not present

## 2012-07-25 DIAGNOSIS — E785 Hyperlipidemia, unspecified: Secondary | ICD-10-CM | POA: Diagnosis not present

## 2012-08-11 ENCOUNTER — Encounter (HOSPITAL_COMMUNITY)
Admission: RE | Admit: 2012-08-11 | Discharge: 2012-08-11 | Disposition: A | Discharge status: Home / Self Care | Payer: Medicare Other | Source: Ambulatory Visit | Attending: Nephrology | Admitting: Nephrology

## 2012-08-11 DIAGNOSIS — Z940 Kidney transplant status: Secondary | ICD-10-CM | POA: Diagnosis not present

## 2012-08-11 DIAGNOSIS — D849 Immunodeficiency, unspecified: Secondary | ICD-10-CM | POA: Diagnosis not present

## 2012-08-11 DIAGNOSIS — N186 End stage renal disease: Secondary | ICD-10-CM | POA: Insufficient documentation

## 2012-08-11 MED ORDER — EPOETIN ALFA 20000 UNIT/ML IJ SOLN
INTRAMUSCULAR | Status: AC
  Filled 2012-08-11: qty 1

## 2012-08-11 MED ORDER — EPOETIN ALFA 20000 UNIT/ML IJ SOLN
20000.0000 [IU] | INTRAMUSCULAR | Status: DC
  Administered 2012-08-11: 20000 [IU] via SUBCUTANEOUS

## 2012-08-25 DIAGNOSIS — E119 Type 2 diabetes mellitus without complications: Secondary | ICD-10-CM | POA: Diagnosis not present

## 2012-08-25 DIAGNOSIS — Z940 Kidney transplant status: Secondary | ICD-10-CM | POA: Diagnosis not present

## 2012-08-25 DIAGNOSIS — E039 Hypothyroidism, unspecified: Secondary | ICD-10-CM | POA: Diagnosis not present

## 2012-08-25 DIAGNOSIS — E785 Hyperlipidemia, unspecified: Secondary | ICD-10-CM | POA: Diagnosis not present

## 2012-08-25 DIAGNOSIS — Z79899 Other long term (current) drug therapy: Secondary | ICD-10-CM | POA: Diagnosis not present

## 2012-08-31 DIAGNOSIS — N184 Chronic kidney disease, stage 4 (severe): Secondary | ICD-10-CM | POA: Diagnosis not present

## 2012-08-31 DIAGNOSIS — T8610 Unspecified complication of kidney transplant: Secondary | ICD-10-CM | POA: Diagnosis not present

## 2012-08-31 DIAGNOSIS — Z940 Kidney transplant status: Secondary | ICD-10-CM | POA: Diagnosis not present

## 2012-08-31 DIAGNOSIS — D509 Iron deficiency anemia, unspecified: Secondary | ICD-10-CM | POA: Diagnosis not present

## 2012-09-07 ENCOUNTER — Other Ambulatory Visit (HOSPITAL_COMMUNITY): Payer: Self-pay | Admitting: *Deleted

## 2012-09-08 ENCOUNTER — Encounter (HOSPITAL_COMMUNITY)
Admission: RE | Admit: 2012-09-08 | Discharge: 2012-09-08 | Disposition: A | Discharge status: Home / Self Care | Payer: Medicare Other | Source: Ambulatory Visit | Attending: Nephrology | Admitting: Nephrology

## 2012-09-08 ENCOUNTER — Encounter (INDEPENDENT_AMBULATORY_CARE_PROVIDER_SITE_OTHER): Payer: Medicare Other | Admitting: *Deleted

## 2012-09-08 DIAGNOSIS — T82598A Other mechanical complication of other cardiac and vascular devices and implants, initial encounter: Secondary | ICD-10-CM

## 2012-09-08 DIAGNOSIS — Z48812 Encounter for surgical aftercare following surgery on the circulatory system: Secondary | ICD-10-CM

## 2012-09-08 MED ORDER — EPOETIN ALFA 40000 UNIT/ML IJ SOLN: 30000.0000 [IU] | INTRAMUSCULAR | Status: DC

## 2012-09-08 MED ORDER — EPOETIN ALFA 10000 UNIT/ML IJ SOLN
INTRAMUSCULAR | Status: AC
  Administered 2012-09-08: 10000 [IU] via SUBCUTANEOUS
  Filled 2012-09-08: qty 1

## 2012-09-08 MED ORDER — EPOETIN ALFA 20000 UNIT/ML IJ SOLN
INTRAMUSCULAR | Status: AC
  Administered 2012-09-08: 20000 [IU] via SUBCUTANEOUS
  Filled 2012-09-08: qty 1

## 2012-09-20 ENCOUNTER — Ambulatory Visit: Payer: Medicare Other | Admitting: Gastroenterology

## 2012-09-21 ENCOUNTER — Other Ambulatory Visit (HOSPITAL_COMMUNITY): Payer: Self-pay | Admitting: *Deleted

## 2012-09-22 ENCOUNTER — Inpatient Hospital Stay (HOSPITAL_COMMUNITY): Admission: RE | Admit: 2012-09-22 | Payer: Medicare Other | Source: Ambulatory Visit

## 2012-09-29 ENCOUNTER — Encounter (HOSPITAL_COMMUNITY)
Admission: RE | Admit: 2012-09-29 | Discharge: 2012-09-29 | Disposition: A | Discharge status: Home / Self Care | Payer: Medicare Other | Source: Ambulatory Visit | Attending: Nephrology | Admitting: Nephrology

## 2012-09-29 DIAGNOSIS — D849 Immunodeficiency, unspecified: Secondary | ICD-10-CM | POA: Insufficient documentation

## 2012-09-29 DIAGNOSIS — Z940 Kidney transplant status: Secondary | ICD-10-CM | POA: Insufficient documentation

## 2012-09-29 DIAGNOSIS — N186 End stage renal disease: Secondary | ICD-10-CM | POA: Insufficient documentation

## 2012-09-29 LAB — RENAL FUNCTION PANEL
Albumin: 3.9 g/dL (ref 3.5–5.2)
Calcium: 7 mg/dL — ABNORMAL LOW (ref 8.4–10.5)
GFR calc Af Amer: 8 mL/min — ABNORMAL LOW (ref >90)
Phosphorus: 5.2 mg/dL — ABNORMAL HIGH (ref 2.3–4.6)
Potassium: 5.2 mEq/L — ABNORMAL HIGH (ref 3.5–5.1)
Sodium: 139 mEq/L (ref 135–145)

## 2012-09-29 LAB — POCT HEMOGLOBIN-HEMACUE: Hemoglobin: 9.3 g/dL — ABNORMAL LOW (ref 12.0–15.0)

## 2012-09-29 MED ORDER — EPOETIN ALFA 10000 UNIT/ML IJ SOLN
INTRAMUSCULAR | Status: AC
  Administered 2012-09-29: 10000 [IU] via SUBCUTANEOUS
  Filled 2012-09-29: qty 1

## 2012-09-29 MED ORDER — EPOETIN ALFA 40000 UNIT/ML IJ SOLN: 30000.0000 [IU] | INTRAMUSCULAR | Status: DC

## 2012-09-29 MED ORDER — EPOETIN ALFA 20000 UNIT/ML IJ SOLN
INTRAMUSCULAR | Status: AC
  Administered 2012-09-29: 20000 [IU] via SUBCUTANEOUS
  Filled 2012-09-29: qty 1

## 2012-10-10 DIAGNOSIS — E039 Hypothyroidism, unspecified: Secondary | ICD-10-CM | POA: Diagnosis not present

## 2012-10-13 ENCOUNTER — Inpatient Hospital Stay (HOSPITAL_COMMUNITY): Admission: RE | Admit: 2012-10-13 | Payer: Medicare Other | Source: Ambulatory Visit

## 2012-10-18 DIAGNOSIS — E119 Type 2 diabetes mellitus without complications: Secondary | ICD-10-CM | POA: Diagnosis not present

## 2012-10-20 ENCOUNTER — Encounter (HOSPITAL_COMMUNITY)
Admission: RE | Admit: 2012-10-20 | Discharge: 2012-10-20 | Disposition: A | Discharge status: Home / Self Care | Payer: Medicare Other | Source: Ambulatory Visit | Attending: Nephrology | Admitting: Nephrology

## 2012-10-20 DIAGNOSIS — D849 Immunodeficiency, unspecified: Secondary | ICD-10-CM | POA: Diagnosis not present

## 2012-10-20 DIAGNOSIS — N186 End stage renal disease: Secondary | ICD-10-CM | POA: Diagnosis not present

## 2012-10-20 DIAGNOSIS — Z940 Kidney transplant status: Secondary | ICD-10-CM | POA: Insufficient documentation

## 2012-10-20 LAB — IRON AND TIBC: UIBC: 136 ug/dL (ref 125–400)

## 2012-10-20 LAB — POCT HEMOGLOBIN-HEMACUE: Hemoglobin: 8.4 g/dL — ABNORMAL LOW (ref 12.0–15.0)

## 2012-10-20 MED ORDER — EPOETIN ALFA 20000 UNIT/ML IJ SOLN
INTRAMUSCULAR | Status: AC
  Administered 2012-10-20: 20000 [IU] via SUBCUTANEOUS
  Filled 2012-10-20: qty 1

## 2012-10-20 MED ORDER — EPOETIN ALFA 10000 UNIT/ML IJ SOLN
INTRAMUSCULAR | Status: AC
  Administered 2012-10-20: 10000 [IU] via SUBCUTANEOUS
  Filled 2012-10-20: qty 1

## 2012-10-20 MED ORDER — EPOETIN ALFA 40000 UNIT/ML IJ SOLN: 30000.0000 [IU] | INTRAMUSCULAR | Status: DC

## 2012-10-22 ENCOUNTER — Emergency Department (HOSPITAL_COMMUNITY): Payer: Medicare Other

## 2012-10-22 ENCOUNTER — Encounter (HOSPITAL_COMMUNITY): Payer: Self-pay | Admitting: Physical Medicine and Rehabilitation

## 2012-10-22 ENCOUNTER — Emergency Department (HOSPITAL_COMMUNITY)
Admission: EM | Admit: 2012-10-22 | Discharge: 2012-10-22 | Disposition: A | Discharge status: Home / Self Care | Payer: Medicare Other | Attending: Emergency Medicine | Admitting: Emergency Medicine

## 2012-10-22 DIAGNOSIS — Z8639 Personal history of other endocrine, nutritional and metabolic disease: Secondary | ICD-10-CM | POA: Insufficient documentation

## 2012-10-22 DIAGNOSIS — M5380 Other specified dorsopathies, site unspecified: Secondary | ICD-10-CM | POA: Diagnosis not present

## 2012-10-22 DIAGNOSIS — Z87891 Personal history of nicotine dependence: Secondary | ICD-10-CM | POA: Diagnosis not present

## 2012-10-22 DIAGNOSIS — E119 Type 2 diabetes mellitus without complications: Secondary | ICD-10-CM | POA: Insufficient documentation

## 2012-10-22 DIAGNOSIS — I120 Hypertensive chronic kidney disease with stage 5 chronic kidney disease or end stage renal disease: Secondary | ICD-10-CM | POA: Insufficient documentation

## 2012-10-22 DIAGNOSIS — R079 Chest pain, unspecified: Secondary | ICD-10-CM | POA: Insufficient documentation

## 2012-10-22 DIAGNOSIS — Z7982 Long term (current) use of aspirin: Secondary | ICD-10-CM | POA: Insufficient documentation

## 2012-10-22 DIAGNOSIS — Z862 Personal history of diseases of the blood and blood-forming organs and certain disorders involving the immune mechanism: Secondary | ICD-10-CM | POA: Insufficient documentation

## 2012-10-22 DIAGNOSIS — N186 End stage renal disease: Secondary | ICD-10-CM | POA: Diagnosis not present

## 2012-10-22 DIAGNOSIS — Z794 Long term (current) use of insulin: Secondary | ICD-10-CM | POA: Insufficient documentation

## 2012-10-22 DIAGNOSIS — E785 Hyperlipidemia, unspecified: Secondary | ICD-10-CM | POA: Insufficient documentation

## 2012-10-22 DIAGNOSIS — E079 Disorder of thyroid, unspecified: Secondary | ICD-10-CM | POA: Diagnosis not present

## 2012-10-22 DIAGNOSIS — Z940 Kidney transplant status: Secondary | ICD-10-CM | POA: Diagnosis not present

## 2012-10-22 DIAGNOSIS — Z7983 Long term (current) use of bisphosphonates: Secondary | ICD-10-CM | POA: Diagnosis not present

## 2012-10-22 DIAGNOSIS — Z79899 Other long term (current) drug therapy: Secondary | ICD-10-CM | POA: Insufficient documentation

## 2012-10-22 DIAGNOSIS — R0602 Shortness of breath: Secondary | ICD-10-CM | POA: Insufficient documentation

## 2012-10-22 DIAGNOSIS — Z5189 Encounter for other specified aftercare: Secondary | ICD-10-CM | POA: Insufficient documentation

## 2012-10-22 DIAGNOSIS — Z87440 Personal history of urinary (tract) infections: Secondary | ICD-10-CM | POA: Diagnosis not present

## 2012-10-22 LAB — CBC WITH DIFFERENTIAL/PLATELET
Basophils Absolute: 0.1 10*3/uL (ref 0.0–0.1)
Basophils Relative: 2 % — ABNORMAL HIGH (ref 0–1)
Eosinophils Relative: 6 % — ABNORMAL HIGH (ref 0–5)
HCT: 26.5 % — ABNORMAL LOW (ref 36.0–46.0)
MCHC: 34 g/dL (ref 30.0–36.0)
MCV: 70.9 fL — ABNORMAL LOW (ref 78.0–100.0)
Monocytes Absolute: 0.6 10*3/uL (ref 0.1–1.0)
Platelets: 189 10*3/uL (ref 150–400)
RDW: 17.1 % — ABNORMAL HIGH (ref 11.5–15.5)
WBC: 5.5 10*3/uL (ref 4.0–10.5)

## 2012-10-22 LAB — BASIC METABOLIC PANEL
Calcium: 6.9 mg/dL — ABNORMAL LOW (ref 8.4–10.5)
Creatinine, Ser: 6.74 mg/dL — ABNORMAL HIGH (ref 0.50–1.10)
GFR calc Af Amer: 7 mL/min — ABNORMAL LOW (ref >90)
GFR calc non Af Amer: 6 mL/min — ABNORMAL LOW (ref >90)
Sodium: 137 mEq/L (ref 135–145)

## 2012-10-22 LAB — POCT I-STAT TROPONIN I: Troponin i, poc: 0 ng/mL (ref 0.00–0.08)

## 2012-10-22 NOTE — ED Notes (Signed)
Dr Winfred Leeds in pts room.

## 2012-10-22 NOTE — ED Notes (Signed)
Pt presents to department for evaluation of midsternal chest pain radiating under L breast and to back. Onset at 4:00am. 10/10 intermittent pain that increases with movement. Also states SOB. Respirations unlabored. Skin warm and dry. Pt is conscious alert and oriented x4.

## 2012-10-22 NOTE — ED Provider Notes (Signed)
History     CSN: [id number]  Arrival date & time 10/22/12  1440  Chief Complaint  Patient presents with  . Chest Pain  . Shortness of Breath   HPI  57 y/o female with history of ESRD s/p transplant, HTN, HLD, DM who presents with cc of chest pain. Symptoms began last night approximately 0400 while sleeping. She states her symptoms began as low back spasms followed by left sided chest pain. She describes the chest pain as sharp and pulsatile. She stated her pain improved and worsened with certain positions. She states her pain was intermittent throughout the rest of the day and subsided at 11:00. She states that last evening prior to going to sleep she cleaned her house for approximately 3 hours. She is currently chest pain free. She denies any fevers, chills, nausea, or vomiting.    Past Medical History  Diagnosis Date  . Hypertension   . Type II diabetes mellitus   . Sickle cell trait   . Gout due to renal impairment 2010    "before finding out I had kidney failure"  . UTI (lower urinary tract infection) 12/08/11    "first one ever"  . History of renal transplant   . Blood transfusion   . Hyperlipidemia   . Chronic kidney disease     s/p transplant, kidney wasn't removed  . Thyroid disease     Past Surgical History  Procedure Laterality Date  . Vaginal hysterectomy  2006  . Tubal ligation  1980's  . Kidney transplant    . Parathyroidectomy      Family History  Problem Relation Age of Onset  . Heart disease Father   . Colon cancer Neg Hx   . Esophageal cancer Neg Hx   . Rectal cancer Neg Hx   . Stomach cancer Neg Hx     History  Substance Use Topics  . Smoking status: Former Smoker -- 0.50 packs/day for 10 years    Types: Cigarettes    Quit date: 11/07/1988  . Smokeless tobacco: Never Used  . Alcohol Use: No    OB History   Grav Para Term Preterm Abortions TAB SAB Ect Mult Living                 Review of Systems  Constitutional: Negative for fever and  chills.  HENT: Negative for congestion and rhinorrhea.   Respiratory: Positive for shortness of breath.   Cardiovascular: Positive for chest pain.  Gastrointestinal: Negative for nausea, vomiting and abdominal pain.  Skin: Negative for rash.  All other systems reviewed and are negative.   Allergies  Codeine  Home Medications   Current Outpatient Rx  Name  Route  Sig  Dispense  Refill  . alendronate (FOSAMAX) 35 MG tablet   Oral   Take 35 mg by mouth every 7 (seven) days. Wednesday  - Take with a full glass of water on an empty stomach.         Marland Kitchen amLODipine (NORVASC) 5 MG tablet   Oral   Take 1 tablet (5 mg total) by mouth daily.   30 tablet   0   . amoxicillin (AMOXIL) 500 MG tablet               . aspirin EC 81 MG tablet   Oral   Take 81 mg by mouth daily.         . calcitRIOL (ROCALTROL) 0.25 MCG capsule               .  furosemide (LASIX) 20 MG tablet   Oral   Take 1 tablet (20 mg total) by mouth every other day.   30 tablet   0   . levothyroxine (SYNTHROID, LEVOTHROID) 25 MCG tablet   Oral   Take 25 mcg by mouth daily.         . metoprolol (LOPRESSOR) 50 MG tablet   Oral   Take 50 mg by mouth 2 (two) times daily.         . mycophenolate (MYFORTIC) 180 MG EC tablet   Oral   Take 180 mg by mouth 2 (two) times daily.         . pioglitazone (ACTOS) 15 MG tablet   Oral   Take 15 mg by mouth daily.         . simvastatin (ZOCOR) 20 MG tablet   Oral   Take 20 mg by mouth 3 (three) times a week. mon wed fri         . tacrolimus (PROGRAF) 1 MG capsule   Oral   Take 8 mg by mouth 2 (two) times daily.         . valACYclovir (VALTREX) 1000 MG tablet   Oral   Take 1 tablet (1,000 mg total) by mouth 3 (three) times daily.   21 tablet   0     BP 169/80  Pulse 79  Temp(Src) 98 F (36.7 C) (Oral)  Resp 16  SpO2 100%  Physical Exam  Nursing note and vitals reviewed. Constitutional: She is oriented to person, place, and time. She  appears well-developed and well-nourished. No distress.  HENT:  Head: Normocephalic and atraumatic.  Mouth/Throat: No oropharyngeal exudate.  Eyes: Conjunctivae are normal. Pupils are equal, round, and reactive to light.  Neck: Normal range of motion. Neck supple.  Cardiovascular: Normal rate and regular rhythm.  Exam reveals no gallop and no friction rub.   No murmur heard. Pulses:      Radial pulses are 2+ on the right side, and 2+ on the left side.  Pulmonary/Chest: Effort normal and breath sounds normal.  Abdominal: Soft. Bowel sounds are normal.  Musculoskeletal: Normal range of motion. She exhibits no edema and no tenderness.  Neurological: She is alert and oriented to person, place, and time. She has normal strength. No sensory deficit.  Skin: Skin is warm and dry.  Psychiatric: She has a normal mood and affect.    ED Course  Procedures (including critical care time)  Labs Reviewed  CBC WITH DIFFERENTIAL - Abnormal; Notable for the following:    RBC 3.74 (*)    Hemoglobin 9.0 (*)    HCT 26.5 (*)    MCV 70.9 (*)    MCH 24.1 (*)    RDW 17.1 (*)    Eosinophils Relative 6 (*)    Basophils Relative 2 (*)    All other components within normal limits  BASIC METABOLIC PANEL - Abnormal; Notable for the following:    CO2 16 (*)    Glucose, Bld 126 (*)    BUN 60 (*)    Creatinine, Ser 6.74 (*)    Calcium 6.9 (*)    GFR calc non Af Amer 6 (*)    GFR calc Af Amer 7 (*)    All other components within normal limits  POCT I-STAT TROPONIN I   Dg Chest 2 View  10/22/2012  *RADIOLOGY REPORT*  Clinical Data: Chest pain and shortness of breath  CHEST - 2 VIEW  Comparison: 11/10/2011  Findings: Cardiomegaly and mild peribronchial thickening again noted. There is no evidence of focal airspace disease, pulmonary edema, suspicious pulmonary nodule/mass, pleural effusion, or pneumothorax. No acute bony abnormalities are identified.  IMPRESSION: Cardiomegaly without evidence of acute  cardiopulmonary disease.   Original Report Authenticated By: Margarette Canada, M.D.      No diagnosis found.   Date: 10/22/2012  Rate: 82  Rhythm: normal sinus rhythm  QRS Axis: normal  Intervals: normal  ST/T Wave abnormalities: normal  Conduction Disutrbances:none  Narrative Interpretation:   Old EKG Reviewed: none available  MDM  57 y/o female with history of ESRD s/p transplant, HTN, HLD, DM who presents with cc of chest pain. Currently CP free. TIMI-3 for age, risk factors, and daily ASA use. EKG without acute ischemia. Initial troponin negative. Low risk for PE and based on history of symptoms doubt PE as cause of the pain. The patient's pain is likely MSK and is extremely atypical for ACS. However, given risk factors and several years since last EST will set up outpatient EST.        Donita Brooks, MD 10/23/12 [phone]

## 2012-10-22 NOTE — ED Notes (Signed)
Pt in xray when rounding

## 2012-10-22 NOTE — ED Provider Notes (Signed)
Complains of left Sided  chest pain originated back radiated to left anterior chest 4 AM today. Pain was pleuritic and intermittent lasting one or 2 minutes at a time. Pain is exacerbated when she twisted her torso to left. Symptoms lasted until 11 AM she has been asymptomatic since she had mild shortness of breath no sweatiness or nausea. Pain was not exacerbated by walking. On exam alert no distress lungs clear auscultation heart regular rate and rhythm no murmurs abdomen obese nontender extremities without edema.   Date: 10/22/2012  Rate: 80  Rhythm: normal sinus rhythm  QRS Axis: normal  Intervals: normal  ST/T Wave abnormalities: normal  Conduction Disutrbances: none  Narrative Interpretation: unremarkable    unchanged from 12/17/08 interpreted by me Medical decision-making pain is atypical for acute coronary syndrome. Patient offered inpatient versus outpatient stay for evaluation of chest pain. She prefers outpatient evaluation. Arrangements made with cardiology  Orlie Dakin, MD 10/22/12 1610

## 2012-10-23 NOTE — ED Provider Notes (Signed)
I have personally seen and examined the patient.  I have discussed the plan of care with the resident.  I have reviewed the documentation on PMH/FH/Soc. History.  I have reviewed the documentation of the resident and agree.  Orlie Dakin, MD 10/23/12 [phone]

## 2012-11-03 ENCOUNTER — Encounter (HOSPITAL_COMMUNITY): Payer: Medicare Other

## 2012-11-07 ENCOUNTER — Encounter (HOSPITAL_COMMUNITY)
Admission: RE | Admit: 2012-11-07 | Discharge: 2012-11-07 | Disposition: A | Discharge status: Home / Self Care | Payer: Medicare Other | Source: Ambulatory Visit | Attending: Nephrology | Admitting: Nephrology

## 2012-11-07 DIAGNOSIS — N186 End stage renal disease: Secondary | ICD-10-CM | POA: Diagnosis not present

## 2012-11-07 DIAGNOSIS — D849 Immunodeficiency, unspecified: Secondary | ICD-10-CM | POA: Diagnosis not present

## 2012-11-07 DIAGNOSIS — Z940 Kidney transplant status: Secondary | ICD-10-CM | POA: Diagnosis not present

## 2012-11-07 LAB — RENAL FUNCTION PANEL
Albumin: 3.5 g/dL (ref 3.5–5.2)
BUN: 77 mg/dL — ABNORMAL HIGH (ref 6–23)
Calcium: 6.3 mg/dL — CL (ref 8.4–10.5)
Creatinine, Ser: 8.17 mg/dL — ABNORMAL HIGH (ref 0.50–1.10)
Phosphorus: 7.6 mg/dL — ABNORMAL HIGH (ref 2.3–4.6)

## 2012-11-07 MED ORDER — EPOETIN ALFA 10000 UNIT/ML IJ SOLN
INTRAMUSCULAR | Status: AC
  Administered 2012-11-07: 10000 [IU] via SUBCUTANEOUS
  Filled 2012-11-07: qty 1

## 2012-11-07 MED ORDER — EPOETIN ALFA 40000 UNIT/ML IJ SOLN: 30000.0000 [IU] | INTRAMUSCULAR | Status: DC

## 2012-11-07 MED ORDER — EPOETIN ALFA 20000 UNIT/ML IJ SOLN
INTRAMUSCULAR | Status: AC
  Administered 2012-11-07: 20000 [IU] via SUBCUTANEOUS
  Filled 2012-11-07: qty 1

## 2012-11-21 ENCOUNTER — Encounter (HOSPITAL_COMMUNITY)
Admission: RE | Admit: 2012-11-21 | Discharge: 2012-11-21 | Disposition: A | Discharge status: Home / Self Care | Payer: Medicare Other | Source: Ambulatory Visit | Attending: Nephrology | Admitting: Nephrology

## 2012-11-21 LAB — POCT HEMOGLOBIN-HEMACUE: Hemoglobin: 9.2 g/dL — ABNORMAL LOW (ref 12.0–15.0)

## 2012-11-21 MED ORDER — EPOETIN ALFA 10000 UNIT/ML IJ SOLN
INTRAMUSCULAR | Status: AC
  Administered 2012-11-21: 10000 [IU] via SUBCUTANEOUS
  Filled 2012-11-21: qty 1

## 2012-11-21 MED ORDER — EPOETIN ALFA 40000 UNIT/ML IJ SOLN: 30000.0000 [IU] | INTRAMUSCULAR | Status: DC

## 2012-11-21 MED ORDER — EPOETIN ALFA 20000 UNIT/ML IJ SOLN
INTRAMUSCULAR | Status: AC
  Administered 2012-11-21: 20000 [IU] via SUBCUTANEOUS
  Filled 2012-11-21: qty 1

## 2012-12-05 ENCOUNTER — Encounter (HOSPITAL_COMMUNITY): Payer: Medicare Other

## 2012-12-06 ENCOUNTER — Other Ambulatory Visit (HOSPITAL_COMMUNITY): Payer: Self-pay | Admitting: *Deleted

## 2012-12-07 ENCOUNTER — Encounter (HOSPITAL_COMMUNITY)
Admission: RE | Admit: 2012-12-07 | Discharge: 2012-12-07 | Disposition: A | Discharge status: Home / Self Care | Payer: Medicare Other | Source: Ambulatory Visit | Attending: Nephrology | Admitting: Nephrology

## 2012-12-07 DIAGNOSIS — N186 End stage renal disease: Secondary | ICD-10-CM | POA: Insufficient documentation

## 2012-12-07 DIAGNOSIS — Z940 Kidney transplant status: Secondary | ICD-10-CM | POA: Diagnosis not present

## 2012-12-07 DIAGNOSIS — D849 Immunodeficiency, unspecified: Secondary | ICD-10-CM | POA: Diagnosis not present

## 2012-12-07 LAB — RENAL FUNCTION PANEL
Albumin: 3.5 g/dL (ref 3.5–5.2)
BUN: 98 mg/dL — ABNORMAL HIGH (ref 6–23)
Calcium: 5.7 mg/dL — CL (ref 8.4–10.5)
Chloride: 109 mEq/L (ref 96–112)
Creatinine, Ser: 11.43 mg/dL — ABNORMAL HIGH (ref 0.50–1.10)
Phosphorus: 9.3 mg/dL — ABNORMAL HIGH (ref 2.3–4.6)

## 2012-12-07 MED ORDER — EPOETIN ALFA 10000 UNIT/ML IJ SOLN
INTRAMUSCULAR | Status: AC
  Filled 2012-12-07: qty 1

## 2012-12-07 MED ORDER — EPOETIN ALFA 40000 UNIT/ML IJ SOLN
30000.0000 [IU] | INTRAMUSCULAR | Status: DC
  Administered 2012-12-07: 30000 [IU] via SUBCUTANEOUS

## 2012-12-07 MED ORDER — EPOETIN ALFA 20000 UNIT/ML IJ SOLN
INTRAMUSCULAR | Status: AC
  Filled 2012-12-07: qty 1

## 2012-12-08 LAB — PTH, INTACT AND CALCIUM: Calcium, Total (PTH): 9.3 mg/dL (ref 8.4–10.5)

## 2012-12-08 MED FILL — Epoetin Alfa Inj 20000 Unit/ML: INTRAMUSCULAR | Qty: 1 | Status: AC

## 2012-12-08 MED FILL — Epoetin Alfa Inj 10000 Unit/ML: INTRAMUSCULAR | Qty: 1 | Status: AC

## 2012-12-11 DIAGNOSIS — Z940 Kidney transplant status: Secondary | ICD-10-CM | POA: Diagnosis not present

## 2012-12-11 DIAGNOSIS — Z79899 Other long term (current) drug therapy: Secondary | ICD-10-CM | POA: Diagnosis not present

## 2012-12-11 DIAGNOSIS — D649 Anemia, unspecified: Secondary | ICD-10-CM | POA: Diagnosis not present

## 2012-12-19 ENCOUNTER — Other Ambulatory Visit (HOSPITAL_COMMUNITY): Payer: Self-pay | Admitting: Nephrology

## 2012-12-19 DIAGNOSIS — N186 End stage renal disease: Secondary | ICD-10-CM

## 2012-12-21 ENCOUNTER — Encounter (HOSPITAL_COMMUNITY): Payer: Medicare Other

## 2012-12-28 ENCOUNTER — Other Ambulatory Visit (HOSPITAL_COMMUNITY): Payer: Self-pay | Admitting: Nephrology

## 2012-12-28 ENCOUNTER — Ambulatory Visit (HOSPITAL_COMMUNITY)
Admission: RE | Admit: 2012-12-28 | Discharge: 2012-12-28 | Disposition: A | Discharge status: Home / Self Care | Payer: Medicare Other | Source: Ambulatory Visit | Attending: Nephrology | Admitting: Nephrology

## 2012-12-28 ENCOUNTER — Encounter (HOSPITAL_COMMUNITY): Payer: Self-pay

## 2012-12-28 DIAGNOSIS — I120 Hypertensive chronic kidney disease with stage 5 chronic kidney disease or end stage renal disease: Secondary | ICD-10-CM | POA: Diagnosis not present

## 2012-12-28 DIAGNOSIS — N186 End stage renal disease: Secondary | ICD-10-CM

## 2012-12-28 DIAGNOSIS — E119 Type 2 diabetes mellitus without complications: Secondary | ICD-10-CM | POA: Diagnosis not present

## 2012-12-28 DIAGNOSIS — T82598A Other mechanical complication of other cardiac and vascular devices and implants, initial encounter: Secondary | ICD-10-CM | POA: Diagnosis not present

## 2012-12-28 DIAGNOSIS — I871 Compression of vein: Secondary | ICD-10-CM | POA: Diagnosis not present

## 2012-12-28 DIAGNOSIS — T82898A Other specified complication of vascular prosthetic devices, implants and grafts, initial encounter: Secondary | ICD-10-CM | POA: Insufficient documentation

## 2012-12-28 DIAGNOSIS — E785 Hyperlipidemia, unspecified: Secondary | ICD-10-CM | POA: Diagnosis not present

## 2012-12-28 DIAGNOSIS — Y832 Surgical operation with anastomosis, bypass or graft as the cause of abnormal reaction of the patient, or of later complication, without mention of misadventure at the time of the procedure: Secondary | ICD-10-CM | POA: Insufficient documentation

## 2012-12-28 DIAGNOSIS — Z992 Dependence on renal dialysis: Secondary | ICD-10-CM | POA: Insufficient documentation

## 2012-12-28 DIAGNOSIS — D573 Sickle-cell trait: Secondary | ICD-10-CM | POA: Insufficient documentation

## 2012-12-28 DIAGNOSIS — Z940 Kidney transplant status: Secondary | ICD-10-CM | POA: Insufficient documentation

## 2012-12-28 MED ORDER — IOHEXOL 300 MG/ML  SOLN
100.0000 mL | Freq: Once | INTRAMUSCULAR | Status: AC | PRN
  Administered 2012-12-28: 100 mL via INTRAVENOUS

## 2012-12-28 NOTE — H&P (Signed)
  HPI: [name] [name] is an 57 y.o. female with ESRD and rt upper arm AV shunt.  Pt having poor pressures during HD and sent for shuntogram. Study shows possible recurrent venous stenosis and pt is amenable to PTA. Had prior PTA of this area in 2009. PMHx and meds reviewed.  Past Medical History:  Past Medical History  Diagnosis Date  . Hypertension   . Type II diabetes mellitus   . Sickle cell trait   . Gout due to renal impairment 2010    "before finding out I had kidney failure"  . UTI (lower urinary tract infection) 12/08/11    "first one ever"  . History of renal transplant   . Blood transfusion   . Hyperlipidemia   . Chronic kidney disease     s/p transplant, kidney wasn't removed  . Thyroid disease     Past Surgical History:  Past Surgical History  Procedure Laterality Date  . Vaginal hysterectomy  2006  . Tubal ligation  1980's  . Kidney transplant    . Parathyroidectomy      Family History:  Family History  Problem Relation Age of Onset  . Heart disease Father   . Colon cancer Neg Hx   . Esophageal cancer Neg Hx   . Rectal cancer Neg Hx   . Stomach cancer Neg Hx     Social History:  reports that she quit smoking about 24 years ago. Her smoking use included Cigarettes. She has a 5 pack-year smoking history. She has never used smokeless tobacco. She reports that she does not drink alcohol or use illicit drugs.  Allergies:  Allergies  Allergen Reactions  . Codeine Hives    Medications: Valtrex, Prograf, Zocor, Myfortic, Synthroid, Insulin, Lasix, Rocaltrol, ASA 81, Norvasc   Please HPI for pertinent positives, otherwise complete 10 system ROS negative.  Physical Exam: There were no vitals taken for this visit. There is no weight on file to calculate BMI.   General Appearance:  Alert, cooperative, no distress, appears stated age  Head:  Normocephalic, without obvious abnormality, atraumatic  ENT: Unremarkable  Lungs:   Clear to auscultation  bilaterally, no w/r/r, respirations unlabored without use of accessory muscles.  Heart:  Regular rate and rhythm, S1, S2 normal, no murmur, rub or gallop.  Extremities: (R)UE AV shunt palpable, accessed for shuntogram  Pulses: 2+ and symmetric  Neurologic: Normal affect, no gross deficits.   No results found for this or any previous visit (from the past 48 hour(s)). No results found.  Assessment/Plan ESRD Stenosis of Rt UE AV shunt For completion shuntogram and angioplasty Discussed procedure, risks, complications. Consent signed in chart  Ascencion Dike PA-C 12/28/2012, 4:38 PM

## 2012-12-28 NOTE — H&P (Signed)
Agree with PA note.  Focal band-like narrowing in the cephalic arch.  Will proceed with PTA.  Signed,  Criselda Peaches, MD Vascular & Interventional Radiologist [facility]

## 2013-01-04 ENCOUNTER — Other Ambulatory Visit: Payer: Self-pay

## 2013-01-04 DIAGNOSIS — Z1231 Encounter for screening mammogram for malignant neoplasm of breast: Secondary | ICD-10-CM

## 2013-01-06 DIAGNOSIS — N186 End stage renal disease: Secondary | ICD-10-CM | POA: Diagnosis not present

## 2013-01-25 DIAGNOSIS — Z01419 Encounter for gynecological examination (general) (routine) without abnormal findings: Secondary | ICD-10-CM | POA: Diagnosis not present

## 2013-01-25 DIAGNOSIS — Z124 Encounter for screening for malignant neoplasm of cervix: Secondary | ICD-10-CM | POA: Diagnosis not present

## 2013-02-05 DIAGNOSIS — N186 End stage renal disease: Secondary | ICD-10-CM | POA: Diagnosis not present

## 2013-02-06 ENCOUNTER — Ambulatory Visit
Admission: RE | Admit: 2013-02-06 | Discharge: 2013-02-06 | Disposition: A | Discharge status: Home / Self Care | Payer: Medicare Other | Source: Ambulatory Visit

## 2013-02-06 DIAGNOSIS — Z1231 Encounter for screening mammogram for malignant neoplasm of breast: Secondary | ICD-10-CM | POA: Diagnosis not present

## 2013-02-20 DIAGNOSIS — Z01810 Encounter for preprocedural cardiovascular examination: Secondary | ICD-10-CM | POA: Diagnosis not present

## 2013-03-08 DIAGNOSIS — N186 End stage renal disease: Secondary | ICD-10-CM | POA: Diagnosis not present

## 2013-04-08 DIAGNOSIS — N186 End stage renal disease: Secondary | ICD-10-CM | POA: Diagnosis not present

## 2013-05-03 DIAGNOSIS — E7800 Pure hypercholesterolemia, unspecified: Secondary | ICD-10-CM | POA: Diagnosis not present

## 2013-05-03 DIAGNOSIS — E039 Hypothyroidism, unspecified: Secondary | ICD-10-CM | POA: Diagnosis not present

## 2013-05-03 DIAGNOSIS — I10 Essential (primary) hypertension: Secondary | ICD-10-CM | POA: Diagnosis not present

## 2013-05-08 ENCOUNTER — Ambulatory Visit (INDEPENDENT_AMBULATORY_CARE_PROVIDER_SITE_OTHER): Payer: Medicare Other | Admitting: Surgery

## 2013-05-08 ENCOUNTER — Encounter (INDEPENDENT_AMBULATORY_CARE_PROVIDER_SITE_OTHER): Payer: Self-pay | Admitting: Surgery

## 2013-05-08 VITALS — BP 113/84 | HR 64 | Temp 97.7°F | Resp 12 | Ht 62.0 in | Wt 200.2 lb

## 2013-05-08 DIAGNOSIS — N186 End stage renal disease: Secondary | ICD-10-CM | POA: Diagnosis not present

## 2013-05-08 DIAGNOSIS — L0231 Cutaneous abscess of buttock: Secondary | ICD-10-CM

## 2013-05-08 NOTE — Patient Instructions (Addendum)
Remove packing on thursday. Ok to shower wed.  Wear maxipad to collect drainage. Finish antibiotics. Return 1 week.

## 2013-05-08 NOTE — Progress Notes (Signed)
Subjective:     Patient ID: [name] [name], female   DOB: October 11, 1955, 57 y.o.   MRN: [id number]  HPI Patient presents at the request of Dr. Meredeth Ide for abscess involving left block. The patient has had pain and swelling involving her medial left buttock.  No fever.  Minimal drainage. Area hard and tender.  Review of Systems  Constitutional: Negative for fever and fatigue.  HENT: Negative.   Cardiovascular: Negative.   Gastrointestinal: Negative.   Hematological: Bruises/bleeds easily.       Objective:   Physical Exam  HENT:  Head: Normocephalic.  Eyes: Pupils are equal, round, and reactive to light. No scleral icterus.  Genitourinary:     Neurological: She is alert.  Skin: Skin is warm and dry.  Psychiatric: She has a normal mood and affect. Thought content normal.       Assessment:     Left block abscess 3 cm    Plan:     Recommended incision and drainage in office under local anesthesia. Patient agrees to proceed. Risks, benefits and alternative options discussed. Under sterile conditions skin and over medial left block prepped and local anesthesia consisting of 1% lidocaine was infiltrated around the area of induration. Cruciate incision made and clear fluid drained. Hemostat used to open this up and packed with quarter-inch plain packing. Dry dressing applied. Hemostasis excellent. Patient will return next week. She will finish antibiotics. Remove packing in 48 hours may shower tomorrow.

## 2013-05-14 ENCOUNTER — Ambulatory Visit (INDEPENDENT_AMBULATORY_CARE_PROVIDER_SITE_OTHER): Payer: PRIVATE HEALTH INSURANCE | Admitting: Surgery

## 2013-05-14 ENCOUNTER — Encounter (INDEPENDENT_AMBULATORY_CARE_PROVIDER_SITE_OTHER): Payer: Self-pay | Admitting: Surgery

## 2013-05-14 VITALS — BP 176/90 | HR 72 | Temp 97.0°F | Resp 16 | Ht 62.0 in | Wt 204.8 lb

## 2013-05-14 DIAGNOSIS — Z9889 Other specified postprocedural states: Secondary | ICD-10-CM

## 2013-05-14 NOTE — Patient Instructions (Signed)
Return as needed.  No further surgery needed.

## 2013-05-14 NOTE — Progress Notes (Signed)
Patient returns after incision and drainage of left buttock abscess 6 days ago. She is doing much better. Less pain.  Exam: Left buttock wound clean with less erythema and no fluctuance. Minimally tender  Impression: Status post incision and drainage left buttock abscess improved  Plan: Return as needed. Finish antibiotics. No further care needed.

## 2013-05-22 ENCOUNTER — Ambulatory Visit
Admission: RE | Admit: 2013-05-22 | Discharge: 2013-05-22 | Disposition: A | Discharge status: Home / Self Care | Payer: PRIVATE HEALTH INSURANCE | Source: Ambulatory Visit | Attending: Nephrology | Admitting: Nephrology

## 2013-05-22 ENCOUNTER — Other Ambulatory Visit: Payer: Self-pay | Admitting: Nephrology

## 2013-05-22 DIAGNOSIS — R05 Cough: Secondary | ICD-10-CM

## 2013-06-07 ENCOUNTER — Ambulatory Visit (INDEPENDENT_AMBULATORY_CARE_PROVIDER_SITE_OTHER): Payer: Medicare Other | Admitting: Podiatry

## 2013-06-07 ENCOUNTER — Encounter: Payer: Self-pay | Admitting: Podiatry

## 2013-06-07 VITALS — BP 136/75 | HR 83 | Resp 20 | Ht 62.0 in | Wt 200.0 lb

## 2013-06-07 DIAGNOSIS — M79609 Pain in unspecified limb: Secondary | ICD-10-CM | POA: Diagnosis not present

## 2013-06-07 DIAGNOSIS — B353 Tinea pedis: Secondary | ICD-10-CM

## 2013-06-07 DIAGNOSIS — E109 Type 1 diabetes mellitus without complications: Secondary | ICD-10-CM

## 2013-06-07 NOTE — Progress Notes (Signed)
[name] [name] presents today as a 57 year old black female with a history of diabetes mellitus type 2 and a history of kidney failure resulting in dialysis. She's concerned that her hallux nail to the right foot has split and followed all. She is also concerned about thickness of the hallux nail left. She denies numbness to her toes and hands.  Objective: I have reviewed her past medical history medications allergies review of systems. Vital signs are stable she is alert and oriented x3. Pulses are strongly palpable bilateral lower remedy. Neurologic sensorium is intact per Semmes-Weinstein monofilament. Deep tendon reflexes are strong brisk bilateral. Muscle strength is 5 over 5 dorsiflexors plantar flexors inverters everters all intrinsic musculature is intact. Cutaneous evaluation demonstrates supple while hydrated cutis with exception of the dystrophic nails to the hallux bilateral. The hallux right demonstrates onychomycosis distally and a fracturing of the nail plate.  Assessment: Onychomycosis with pain in limb  Plan: Debridement of mycotic nails is covered service secondary to pain. Followup with her on as-needed basis.

## 2013-06-07 NOTE — Progress Notes (Signed)
N nail started to peel away , discoloring thick toenail  L right great  D 3 weeks ago  O all of sudden  C no better no worse A snags on socks  T tried to trim self    Diabetic over 10 years .  Last a1c was about a 7

## 2013-06-08 DIAGNOSIS — N186 End stage renal disease: Secondary | ICD-10-CM | POA: Diagnosis not present

## 2013-08-14 DIAGNOSIS — E7800 Pure hypercholesterolemia, unspecified: Secondary | ICD-10-CM | POA: Diagnosis not present

## 2013-08-14 DIAGNOSIS — E039 Hypothyroidism, unspecified: Secondary | ICD-10-CM | POA: Diagnosis not present

## 2013-08-14 DIAGNOSIS — IMO0001 Reserved for inherently not codable concepts without codable children: Secondary | ICD-10-CM | POA: Diagnosis not present

## 2013-08-21 DIAGNOSIS — E039 Hypothyroidism, unspecified: Secondary | ICD-10-CM | POA: Diagnosis not present

## 2013-08-21 DIAGNOSIS — E7800 Pure hypercholesterolemia, unspecified: Secondary | ICD-10-CM | POA: Diagnosis not present

## 2013-08-21 DIAGNOSIS — IMO0001 Reserved for inherently not codable concepts without codable children: Secondary | ICD-10-CM | POA: Diagnosis not present

## 2013-08-21 DIAGNOSIS — I10 Essential (primary) hypertension: Secondary | ICD-10-CM | POA: Diagnosis not present

## 2013-09-27 DIAGNOSIS — I871 Compression of vein: Secondary | ICD-10-CM | POA: Diagnosis not present

## 2013-09-27 DIAGNOSIS — N186 End stage renal disease: Secondary | ICD-10-CM | POA: Diagnosis not present

## 2013-09-27 DIAGNOSIS — T82898A Other specified complication of vascular prosthetic devices, implants and grafts, initial encounter: Secondary | ICD-10-CM | POA: Diagnosis not present

## 2013-10-05 IMAGING — MG MM DIGITAL SCREENING BILAT
4 series · 4 of 4 positions shown · non-contrast
Comparison: none

DG SCREEN MAMMOGRAM BILATERAL
Bilateral CC and MLO view(s) were taken.

DIGITAL SCREENING MAMMOGRAM WITH CAD:
There are scattered fibroglandular densities.  No masses or malignant type calcifications are 
identified.  Compared with prior studies.
Images were processed with CAD.

[R CC]
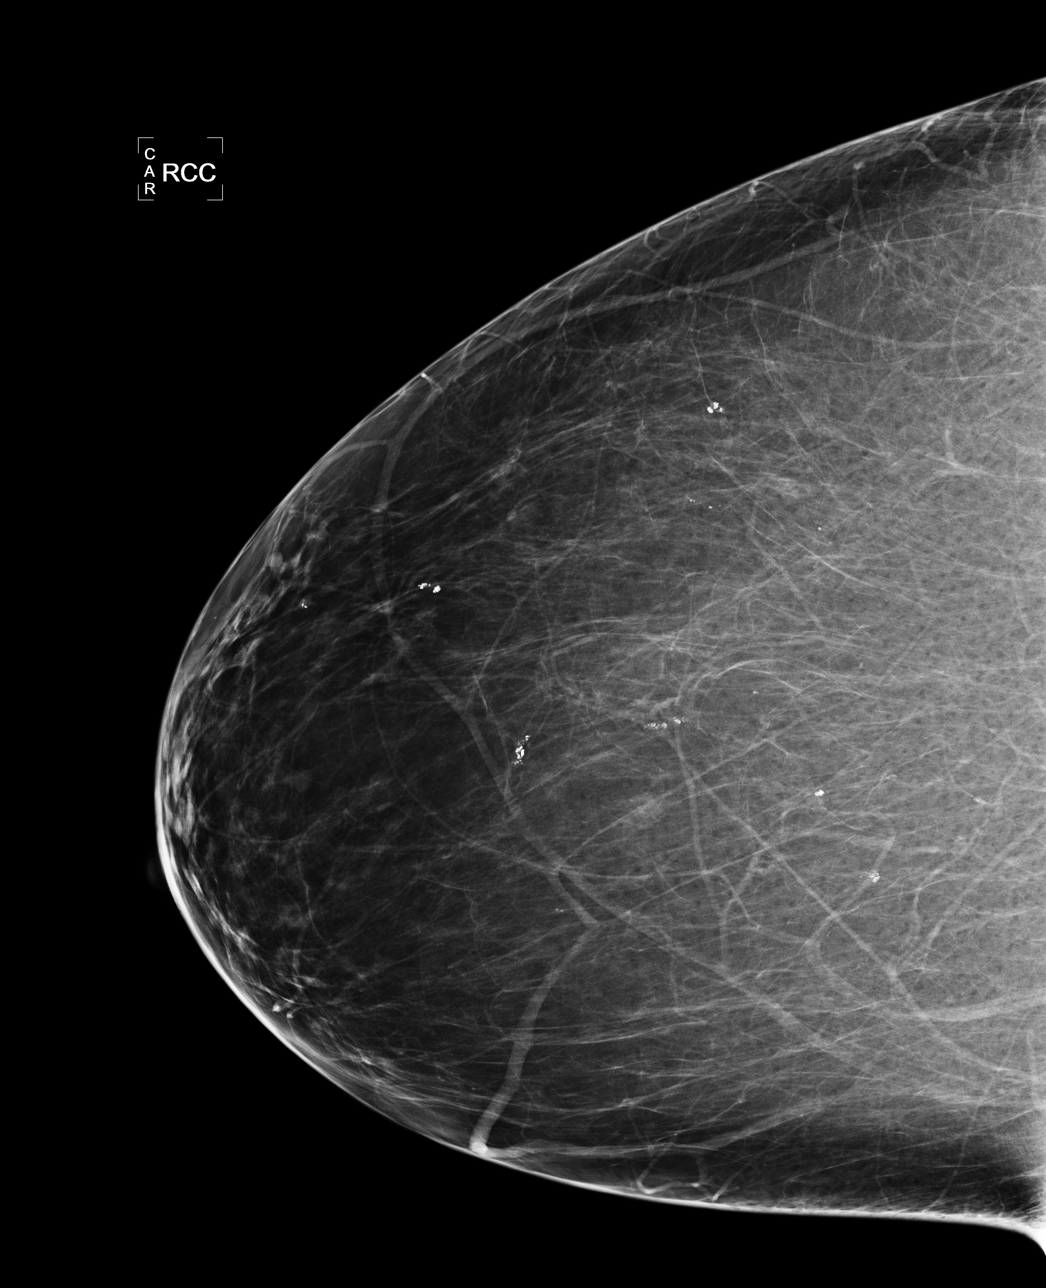

[L CC]
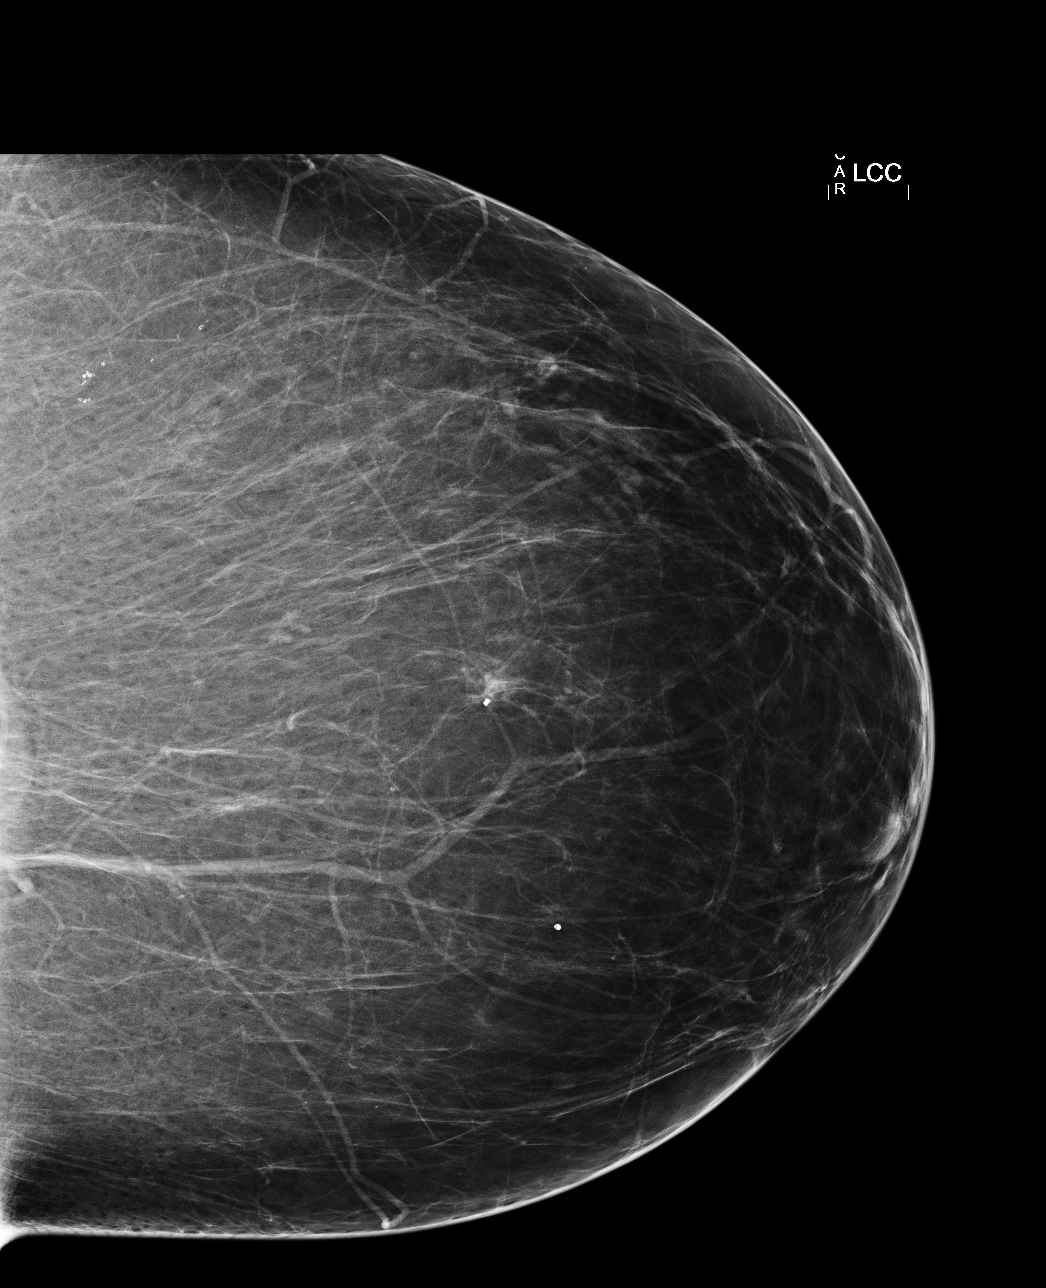

[L MLO]
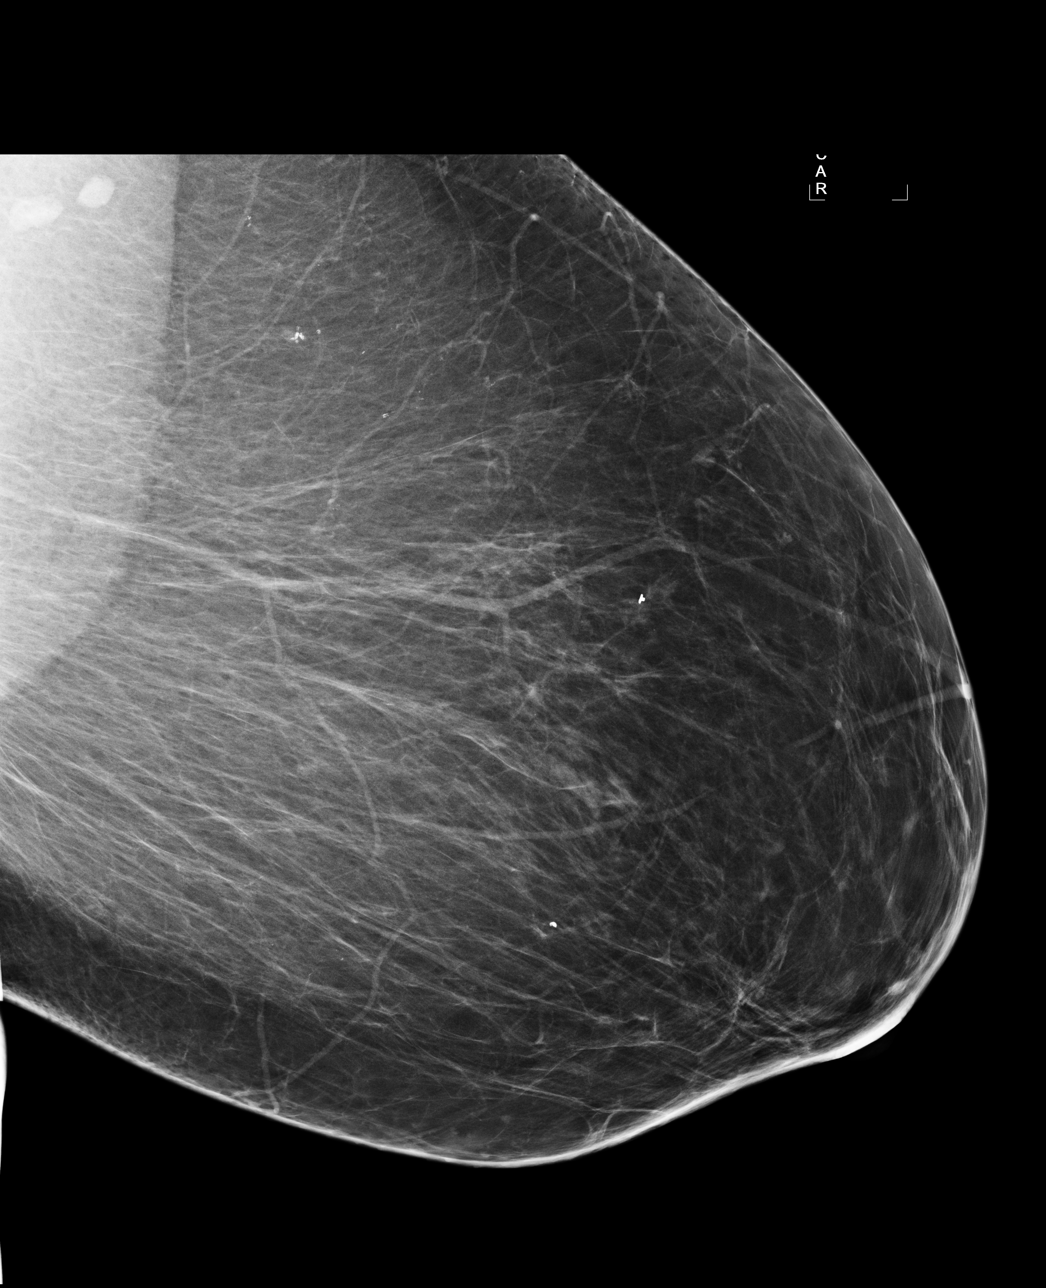

[R MLO]
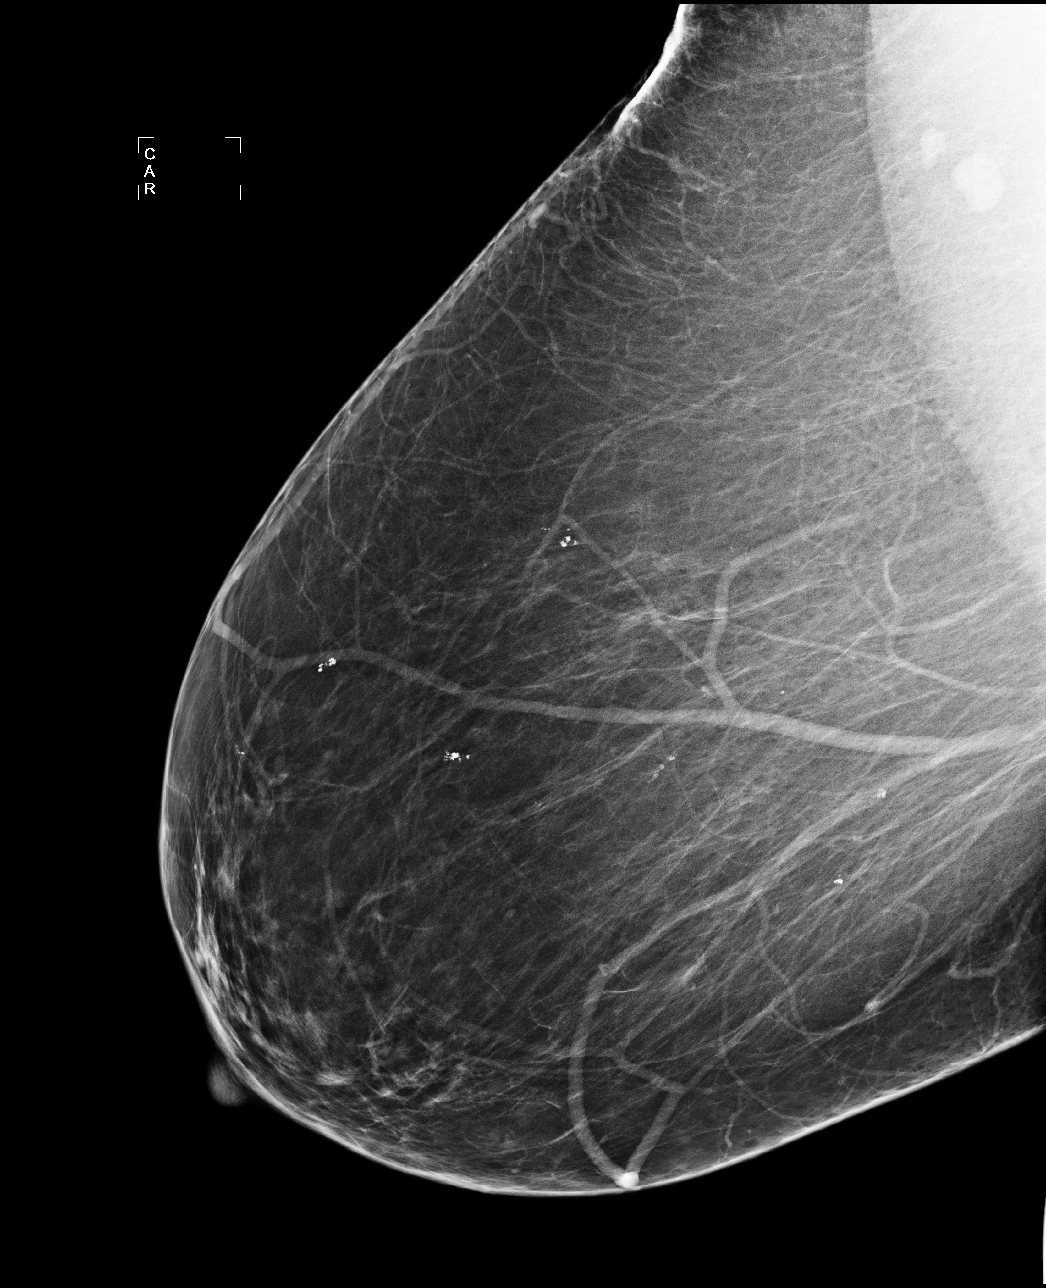

[4 of 4 positions shown; findings below may reference images not displayed]

IMPRESSION: No specific mammographic evidence of malignancy.  Next screening mammogram is recommended in one 
year.

A result letter of this screening mammogram will be mailed directly to the patient.

ASSESSMENT: Negative - BI-RADS 1

Screening mammogram in 1 year.
,

## 2014-04-11 DIAGNOSIS — E7800 Pure hypercholesterolemia, unspecified: Secondary | ICD-10-CM | POA: Diagnosis not present

## 2014-04-11 DIAGNOSIS — IMO0001 Reserved for inherently not codable concepts without codable children: Secondary | ICD-10-CM | POA: Diagnosis not present

## 2014-04-11 DIAGNOSIS — E039 Hypothyroidism, unspecified: Secondary | ICD-10-CM | POA: Diagnosis not present

## 2014-04-16 DIAGNOSIS — IMO0001 Reserved for inherently not codable concepts without codable children: Secondary | ICD-10-CM | POA: Diagnosis not present

## 2014-04-16 DIAGNOSIS — E039 Hypothyroidism, unspecified: Secondary | ICD-10-CM | POA: Diagnosis not present

## 2014-04-16 DIAGNOSIS — E7800 Pure hypercholesterolemia, unspecified: Secondary | ICD-10-CM | POA: Diagnosis not present

## 2014-04-16 DIAGNOSIS — I10 Essential (primary) hypertension: Secondary | ICD-10-CM | POA: Diagnosis not present

## 2014-05-20 ENCOUNTER — Other Ambulatory Visit: Payer: Self-pay

## 2014-05-20 DIAGNOSIS — Z1239 Encounter for other screening for malignant neoplasm of breast: Secondary | ICD-10-CM

## 2014-05-30 ENCOUNTER — Other Ambulatory Visit: Payer: Self-pay

## 2014-05-30 DIAGNOSIS — I871 Compression of vein: Secondary | ICD-10-CM | POA: Diagnosis not present

## 2014-05-30 DIAGNOSIS — Z1231 Encounter for screening mammogram for malignant neoplasm of breast: Secondary | ICD-10-CM

## 2014-05-30 DIAGNOSIS — N186 End stage renal disease: Secondary | ICD-10-CM | POA: Diagnosis not present

## 2014-05-30 DIAGNOSIS — T82858A Stenosis of vascular prosthetic devices, implants and grafts, initial encounter: Secondary | ICD-10-CM | POA: Diagnosis not present

## 2014-05-30 DIAGNOSIS — Z992 Dependence on renal dialysis: Secondary | ICD-10-CM | POA: Diagnosis not present

## 2014-06-04 ENCOUNTER — Ambulatory Visit
Admission: RE | Admit: 2014-06-04 | Discharge: 2014-06-04 | Disposition: A | Discharge status: Home / Self Care | Payer: PRIVATE HEALTH INSURANCE | Source: Ambulatory Visit

## 2014-06-04 ENCOUNTER — Encounter (INDEPENDENT_AMBULATORY_CARE_PROVIDER_SITE_OTHER): Payer: Self-pay

## 2014-06-04 DIAGNOSIS — Z1231 Encounter for screening mammogram for malignant neoplasm of breast: Secondary | ICD-10-CM

## 2014-10-11 ENCOUNTER — Encounter (HOSPITAL_COMMUNITY): Payer: Self-pay | Admitting: Family Medicine

## 2014-10-11 ENCOUNTER — Emergency Department (HOSPITAL_COMMUNITY)
Admission: EM | Admit: 2014-10-11 | Discharge: 2014-10-11 | Disposition: A | Discharge status: Home / Self Care | Payer: PRIVATE HEALTH INSURANCE | Attending: Emergency Medicine | Admitting: Emergency Medicine

## 2014-10-11 ENCOUNTER — Emergency Department (HOSPITAL_COMMUNITY): Payer: PRIVATE HEALTH INSURANCE

## 2014-10-11 DIAGNOSIS — N189 Chronic kidney disease, unspecified: Secondary | ICD-10-CM | POA: Diagnosis not present

## 2014-10-11 DIAGNOSIS — E785 Hyperlipidemia, unspecified: Secondary | ICD-10-CM | POA: Insufficient documentation

## 2014-10-11 DIAGNOSIS — E119 Type 2 diabetes mellitus without complications: Secondary | ICD-10-CM | POA: Insufficient documentation

## 2014-10-11 DIAGNOSIS — Z940 Kidney transplant status: Secondary | ICD-10-CM | POA: Insufficient documentation

## 2014-10-11 DIAGNOSIS — Z8739 Personal history of other diseases of the musculoskeletal system and connective tissue: Secondary | ICD-10-CM | POA: Insufficient documentation

## 2014-10-11 DIAGNOSIS — E079 Disorder of thyroid, unspecified: Secondary | ICD-10-CM | POA: Diagnosis not present

## 2014-10-11 DIAGNOSIS — Z87891 Personal history of nicotine dependence: Secondary | ICD-10-CM | POA: Diagnosis not present

## 2014-10-11 DIAGNOSIS — Z862 Personal history of diseases of the blood and blood-forming organs and certain disorders involving the immune mechanism: Secondary | ICD-10-CM | POA: Diagnosis not present

## 2014-10-11 DIAGNOSIS — Z794 Long term (current) use of insulin: Secondary | ICD-10-CM | POA: Diagnosis not present

## 2014-10-11 DIAGNOSIS — Z87440 Personal history of urinary (tract) infections: Secondary | ICD-10-CM | POA: Insufficient documentation

## 2014-10-11 DIAGNOSIS — R059 Cough, unspecified: Secondary | ICD-10-CM

## 2014-10-11 DIAGNOSIS — Z79899 Other long term (current) drug therapy: Secondary | ICD-10-CM | POA: Diagnosis not present

## 2014-10-11 DIAGNOSIS — J40 Bronchitis, not specified as acute or chronic: Secondary | ICD-10-CM

## 2014-10-11 DIAGNOSIS — Z7982 Long term (current) use of aspirin: Secondary | ICD-10-CM | POA: Insufficient documentation

## 2014-10-11 DIAGNOSIS — R05 Cough: Secondary | ICD-10-CM

## 2014-10-11 DIAGNOSIS — I129 Hypertensive chronic kidney disease with stage 1 through stage 4 chronic kidney disease, or unspecified chronic kidney disease: Secondary | ICD-10-CM | POA: Diagnosis not present

## 2014-10-11 MED ORDER — MOMETASONE FUROATE 50 MCG/ACT NA SUSP
2.0000 | Freq: Every day | NASAL | Status: DC
Start: 2014-10-11 — End: 2020-10-13

## 2014-10-11 MED ORDER — IPRATROPIUM-ALBUTEROL 0.5-2.5 (3) MG/3ML IN SOLN
3.0000 mL | Freq: Once | RESPIRATORY_TRACT | Status: AC
  Administered 2014-10-11: 3 mL via RESPIRATORY_TRACT
  Filled 2014-10-11: qty 3

## 2014-10-11 MED ORDER — BENZONATATE 100 MG PO CAPS: 100.0000 mg | ORAL_CAPSULE | Freq: Three times a day (TID) | ORAL | Status: DC

## 2014-10-11 MED ORDER — ALBUTEROL SULFATE HFA 108 (90 BASE) MCG/ACT IN AERS: 2.0000 | INHALATION_SPRAY | RESPIRATORY_TRACT | Status: DC | PRN

## 2014-10-11 NOTE — ED Notes (Signed)
The pt just finished her hhn.  No distress husband at her bedside

## 2014-10-11 NOTE — Discharge Instructions (Signed)
Cough, Adult  A cough is a reflex that helps clear your throat and airways. It can help heal the body or may be a reaction to an irritated airway. A cough may only last 2 or 3 weeks (acute) or may last more than 8 weeks (chronic).  CAUSES Acute cough:  Viral or bacterial infections. Chronic cough:  Infections.  Allergies.  Asthma.  Post-nasal drip.  Smoking.  Heartburn or acid reflux.  Some medicines.  Chronic lung problems (COPD).  Cancer. SYMPTOMS   Cough.  Fever.  Chest pain.  Increased breathing rate.  High-pitched whistling sound when breathing (wheezing).  Colored mucus that you cough up (sputum). TREATMENT   A bacterial cough may be treated with antibiotic medicine.  A viral cough must run its course and will not respond to antibiotics.  Your caregiver may recommend other treatments if you have a chronic cough. HOME CARE INSTRUCTIONS   Only take over-the-counter or prescription medicines for pain, discomfort, or fever as directed by your caregiver. Use cough suppressants only as directed by your caregiver.  Use a cold steam vaporizer or humidifier in your bedroom or home to help loosen secretions.  Sleep in a semi-upright position if your cough is worse at night.  Rest as needed.  Stop smoking if you smoke. SEEK IMMEDIATE MEDICAL CARE IF:   You have pus in your sputum.  Your cough starts to worsen.  You cannot control your cough with suppressants and are losing sleep.  You begin coughing up blood.  You have difficulty breathing.  You develop pain which is getting worse or is uncontrolled with medicine.  You have a fever. MAKE SURE YOU:   Understand these instructions.  Will watch your condition.  Will get help right away if you are not doing well or get worse. Document Released: 01/22/2011 Document Revised: 10/18/2011 Document Reviewed: 01/22/2011 Exit[employer] Patient Information 2015 [employer]. This information is not intended  to replace advice given to you by your health care provider. Make sure you discuss any questions you have with your health care provider.  

## 2014-10-11 NOTE — ED Notes (Signed)
Pt here for chronic cough x 1 month. sts some mucous. Pt sts last dialysis today. sts just finished taking abx.

## 2014-10-11 NOTE — ED Provider Notes (Signed)
CSN: [id number]     Arrival date & time 10/11/14  1839 History   First MD Initiated Contact with Patient 10/11/14 2212     Chief Complaint  Patient presents with  . Cough     (Consider location/radiation/quality/duration/timing/severity/associated sxs/prior Treatment) HPI Comments: Patient presents to the ER for evaluation of cough and upper respiratory symptoms. Patient reports that she has had these symptoms for a month. She reports sinus congestion and drainage, worse voice and productive cough. Her doctor put her on an antibiotic, she just finished a week's course without much improvement. Patient reports continued cough and congestion with some mild shortness of breath. She is not expressing any fever or chest pain.  Patient is a 59 y.o. female presenting with cough.  Cough   Past Medical History  Diagnosis Date  . Hypertension   . Type II diabetes mellitus   . Sickle cell trait   . Gout due to renal impairment 2010    "before finding out I had kidney failure"  . UTI (lower urinary tract infection) 12/08/11    "first one ever"  . History of renal transplant   . Blood transfusion   . Hyperlipidemia   . Chronic kidney disease     s/p transplant, kidney wasn't removed  . Thyroid disease    Past Surgical History  Procedure Laterality Date  . Vaginal hysterectomy  2006  . Tubal ligation  1980's  . Kidney transplant    . Parathyroidectomy     Family History  Problem Relation Age of Onset  . Heart disease Father   . Colon cancer Neg Hx   . Esophageal cancer Neg Hx   . Rectal cancer Neg Hx   . Stomach cancer Neg Hx    History  Substance Use Topics  . Smoking status: Former Smoker -- 0.50 packs/day for 10 years    Types: Cigarettes    Quit date: 11/07/1988  . Smokeless tobacco: Never Used  . Alcohol Use: No   OB History    No data available     Review of Systems  HENT: Positive for congestion.   Respiratory: Positive for cough.   All other systems reviewed and  are negative.     Allergies  Codeine  Home Medications   Prior to Admission medications   Medication Sig Start Date End Date Taking? Authorizing Provider  amLODipine (NORVASC) 5 MG tablet Take 1 tablet (5 mg total) by mouth daily. 12/09/11 12/08/12  Clanford Marisa Hua, MD  aspirin EC 81 MG tablet Take 81 mg by mouth daily.   Yes Historical Provider, MD  calcitRIOL (ROCALTROL) 0.25 MCG capsule Take 0.25 mcg by mouth daily.  08/31/12  Yes Historical Provider, MD  calcium acetate (PHOSLO) 667 MG capsule Take 2,001 mg by mouth 3 (three) times daily with meals.   Yes Historical Provider, MD  insulin lispro (HUMALOG KWIKPEN) 100 UNIT/ML injection Inject 3 Units into the skin 3 (three) times daily as needed for high blood sugar. Takes as needed prior to eating a big meal or if blood sugar is >130   Yes Historical Provider, MD  insulin NPH (HUMULIN N KWIKPEN) 100 UNIT/ML injection Inject 8 Units into the skin at bedtime.   Yes Historical Provider, MD  levothyroxine (SYNTHROID, LEVOTHROID) 25 MCG tablet Take 25 mcg by mouth daily.   Yes Historical Provider, MD  metoprolol (LOPRESSOR) 50 MG tablet Take 50 mg by mouth 2 (two) times daily.   Yes Historical Provider, MD  simvastatin (ZOCOR) 20  MG tablet Take 20 mg by mouth 3 (three) times a week. mon wed fri   Yes Historical Provider, MD  terconazole (TERAZOL 7) 0.4 % vaginal cream Place 1 applicator vaginally at bedtime.  02/19/13  Yes Historical Provider, MD  valACYclovir (VALTREX) 1000 MG tablet Take 1,000 mg by mouth 3 (three) times daily as needed. For herpes simplex 07/17/12  Yes Billy Fischer, MD   BP 115/72 mmHg  Pulse 78  Temp(Src) 98.2 F (36.8 C) (Oral)  Resp 16  SpO2 96% Physical Exam  Constitutional: She is oriented to person, place, and time. She appears well-developed and well-nourished. No distress.  HENT:  Head: Normocephalic and atraumatic.  Right Ear: Hearing normal.  Left Ear: Hearing normal.  Nose: Nose normal.  Mouth/Throat:  Oropharynx is clear and moist and mucous membranes are normal.  Eyes: Conjunctivae and EOM are normal. Pupils are equal, round, and reactive to light.  Neck: Normal range of motion. Neck supple.  Cardiovascular: Regular rhythm, S1 normal and S2 normal.  Exam reveals no gallop and no friction rub.   No murmur heard. Pulmonary/Chest: Effort normal and breath sounds normal. No respiratory distress. She exhibits no tenderness.  Abdominal: Soft. Normal appearance and bowel sounds are normal. There is no hepatosplenomegaly. There is no tenderness. There is no rebound, no guarding, no tenderness at McBurney's point and negative Murphy's sign. No hernia.  Musculoskeletal: Normal range of motion.  Neurological: She is alert and oriented to person, place, and time. She has normal strength. No cranial nerve deficit or sensory deficit. Coordination normal. GCS eye subscore is 4. GCS verbal subscore is 5. GCS motor subscore is 6.  Skin: Skin is warm, dry and intact. No rash noted. No cyanosis.  Psychiatric: She has a normal mood and affect. Her speech is normal and behavior is normal. Thought content normal.  Nursing note and vitals reviewed.   ED Course  Procedures (including critical care time) Labs Review Labs Reviewed - No data to display  Imaging Review Dg Chest 2 View  10/11/2014   CLINICAL DATA:  Persistent productive cough for 1 month. Initial encounter.  EXAM: CHEST  2 VIEW  COMPARISON:  Chest radiograph performed 05/22/2013  FINDINGS: The lungs are well-aerated. Mild bibasilar atelectasis is noted. Mild peribronchial thickening is seen. There is no evidence of focal opacification, pleural effusion or pneumothorax.  The heart is normal in size; the mediastinal contour is within normal limits. No acute osseous abnormalities are seen.  IMPRESSION: Mild bibasilar atelectasis noted; mild peribronchial thickening seen.   Electronically Signed   By: Garald Balding M.D.   On: 10/11/2014 19:29     EKG  Interpretation None      MDM   Final diagnoses:  Cough   bronchitis  Presents to the ER for evaluation of cough and upper respiratory symptoms ever been ongoing for one month. She just completed a course of antibiotics. Chest x-ray was obtained and there is no evidence of pneumonia. She does not have any clinical signs of congestive heart failure. Oxygenation is 100% on room air. She does not have a fever. Symptoms are most consistent with persistent cough and bronchospasm status post bronchitis. She was administered a nebulizer here in the ER and felt much better. We'll continue bronchodilators as an outpatient. She does have diabetes, will hold off on prednisone now. No further antibiotics needed.    Orpah Greek, MD 10/11/14 [phone]

## 2014-10-20 ENCOUNTER — Emergency Department (HOSPITAL_COMMUNITY): Payer: PRIVATE HEALTH INSURANCE

## 2014-10-20 ENCOUNTER — Emergency Department (HOSPITAL_COMMUNITY)
Admission: EM | Admit: 2014-10-20 | Discharge: 2014-10-20 | Disposition: A | Discharge status: Home / Self Care | Payer: PRIVATE HEALTH INSURANCE | Attending: Emergency Medicine | Admitting: Emergency Medicine

## 2014-10-20 ENCOUNTER — Encounter (HOSPITAL_COMMUNITY): Payer: Self-pay | Admitting: *Deleted

## 2014-10-20 DIAGNOSIS — Z7982 Long term (current) use of aspirin: Secondary | ICD-10-CM | POA: Insufficient documentation

## 2014-10-20 DIAGNOSIS — R0789 Other chest pain: Secondary | ICD-10-CM | POA: Insufficient documentation

## 2014-10-20 DIAGNOSIS — Z862 Personal history of diseases of the blood and blood-forming organs and certain disorders involving the immune mechanism: Secondary | ICD-10-CM | POA: Insufficient documentation

## 2014-10-20 DIAGNOSIS — Z940 Kidney transplant status: Secondary | ICD-10-CM | POA: Insufficient documentation

## 2014-10-20 DIAGNOSIS — E119 Type 2 diabetes mellitus without complications: Secondary | ICD-10-CM | POA: Insufficient documentation

## 2014-10-20 DIAGNOSIS — I129 Hypertensive chronic kidney disease with stage 1 through stage 4 chronic kidney disease, or unspecified chronic kidney disease: Secondary | ICD-10-CM | POA: Insufficient documentation

## 2014-10-20 DIAGNOSIS — Z87891 Personal history of nicotine dependence: Secondary | ICD-10-CM | POA: Diagnosis not present

## 2014-10-20 DIAGNOSIS — Z8739 Personal history of other diseases of the musculoskeletal system and connective tissue: Secondary | ICD-10-CM | POA: Insufficient documentation

## 2014-10-20 DIAGNOSIS — Z7952 Long term (current) use of systemic steroids: Secondary | ICD-10-CM | POA: Insufficient documentation

## 2014-10-20 DIAGNOSIS — Z794 Long term (current) use of insulin: Secondary | ICD-10-CM | POA: Insufficient documentation

## 2014-10-20 DIAGNOSIS — E079 Disorder of thyroid, unspecified: Secondary | ICD-10-CM | POA: Diagnosis not present

## 2014-10-20 DIAGNOSIS — Z87440 Personal history of urinary (tract) infections: Secondary | ICD-10-CM | POA: Diagnosis not present

## 2014-10-20 DIAGNOSIS — M549 Dorsalgia, unspecified: Secondary | ICD-10-CM | POA: Diagnosis present

## 2014-10-20 DIAGNOSIS — R059 Cough, unspecified: Secondary | ICD-10-CM

## 2014-10-20 DIAGNOSIS — R05 Cough: Secondary | ICD-10-CM

## 2014-10-20 DIAGNOSIS — N189 Chronic kidney disease, unspecified: Secondary | ICD-10-CM | POA: Diagnosis not present

## 2014-10-20 MED ORDER — HYDROCODONE-ACETAMINOPHEN 5-325 MG PO TABS: 1.0000 | ORAL_TABLET | Freq: Four times a day (QID) | ORAL | Status: DC | PRN

## 2014-10-20 MED ORDER — LIDOCAINE 5 % EX PTCH: 1.0000 | MEDICATED_PATCH | CUTANEOUS | Status: DC

## 2014-10-20 MED ORDER — NAPROXEN 500 MG PO TABS: 500.0000 mg | ORAL_TABLET | Freq: Two times a day (BID) | ORAL | Status: DC

## 2014-10-20 NOTE — Discharge Instructions (Signed)

## 2014-10-20 NOTE — ED Provider Notes (Signed)
CSN: [id number]     Arrival date & time 10/20/14  1154 History  This chart was scribed for non-physician practitioner, Margarita Mail, PA-C,working with Lajean Saver, MD, by Marlowe Kays, ED Scribe. This patient was seen in room WTR7/WTR7 and the patient's care was started at 12:52 PM.  Chief Complaint  Patient presents with  . Back Pain   Patient is a 59 y.o. female presenting with back pain. The history is provided by the patient and medical records. No language interpreter was used.  Back Pain Associated symptoms: no dysuria, no fever, no numbness and no weakness     HPI Comments:  [name] [name] is a 59 y.o. obese dialysis female who presents to the Emergency Department complaining of a cough that began approximately 1.5 months ago. She states she was seen at [facility] about one week ago and was diagnosed with bronchitis and treated with Tessalon Perles, Nasonex and Albuterol MDI. She reports associated chills and states the cough is worse at night, but is slightly improving. She states the cough has caused her to strain her right lower back in which she is now reporting severe pain. She states she has been taking pain Aleve and using muscle rubs with minimal relief of the pain. The patient's husband states they have been moving for the past week and she has been lifting heavy objects and doing an increased amount of strenuous work that has caused her left lower back to begin aching with radiation around to her abdomen. Raising to a standing position and coughing makes the pain worse. Denies alleviating factors. Denies fever, dysuria, hematuria, foul odor of the urine, numbness, tingling or weakness of the lower extremities, saddle anesthesia, upper extremity pain, bruising, wounds or rashes. Pt states she is dialyzed Monday, Wednesday and Friday and has not missed any sessions. Reports h/o shingles which she states feels different from this type of pain. Denies h/o asthma or tobacco use in the past  20 years. PMHx of HTN, DM, gout, renal transplant, HLD, CKD and thyroid disease.  Past Medical History  Diagnosis Date  . Hypertension   . Type II diabetes mellitus   . Sickle cell trait   . Gout due to renal impairment 2010    "before finding out I had kidney failure"  . UTI (lower urinary tract infection) 12/08/11    "first one ever"  . History of renal transplant   . Blood transfusion   . Hyperlipidemia   . Chronic kidney disease     s/p transplant, kidney wasn't removed  . Thyroid disease    Past Surgical History  Procedure Laterality Date  . Vaginal hysterectomy  2006  . Tubal ligation  1980's  . Kidney transplant    . Parathyroidectomy     Family History  Problem Relation Age of Onset  . Heart disease Father   . Colon cancer Neg Hx   . Esophageal cancer Neg Hx   . Rectal cancer Neg Hx   . Stomach cancer Neg Hx    History  Substance Use Topics  . Smoking status: Former Smoker -- 0.50 packs/day for 10 years    Types: Cigarettes    Quit date: 11/07/1988  . Smokeless tobacco: Never Used  . Alcohol Use: No   OB History    No data available     Review of Systems  Constitutional: Positive for chills. Negative for fever.  Respiratory: Positive for cough.   Genitourinary: Negative for dysuria and hematuria.  Musculoskeletal: Positive for back  pain.  Skin: Negative for color change, rash and wound.  Neurological: Negative for weakness and numbness.    Allergies  Codeine  Home Medications   Prior to Admission medications   Medication Sig Start Date End Date Taking? Authorizing Provider  albuterol (PROVENTIL HFA;VENTOLIN HFA) 108 (90 BASE) MCG/ACT inhaler Inhale 2 puffs into the lungs every 4 (four) hours as needed for wheezing or shortness of breath. 10/11/14   Orpah Greek, MD  amLODipine (NORVASC) 5 MG tablet Take 1 tablet (5 mg total) by mouth daily. 12/09/11 12/08/12  Clanford Marisa Hua, MD  aspirin EC 81 MG tablet Take 81 mg by mouth daily.     Historical Provider, MD  benzonatate (TESSALON) 100 MG capsule Take 1 capsule (100 mg total) by mouth every 8 (eight) hours. 10/11/14   Orpah Greek, MD  calcitRIOL (ROCALTROL) 0.25 MCG capsule Take 0.25 mcg by mouth daily.  08/31/12   Historical Provider, MD  calcium acetate (PHOSLO) 667 MG capsule Take 2,001 mg by mouth 3 (three) times daily with meals.    Historical Provider, MD  HYDROcodone-acetaminophen (NORCO) 5-325 MG per tablet Take 1-2 tablets by mouth every 6 (six) hours as needed for moderate pain. 10/20/14   Margarita Mail, PA-C  insulin lispro (HUMALOG KWIKPEN) 100 UNIT/ML injection Inject 3 Units into the skin 3 (three) times daily as needed for high blood sugar. Takes as needed prior to eating a big meal or if blood sugar is >130    Historical Provider, MD  insulin NPH (HUMULIN N KWIKPEN) 100 UNIT/ML injection Inject 8 Units into the skin at bedtime.    Historical Provider, MD  levothyroxine (SYNTHROID, LEVOTHROID) 25 MCG tablet Take 25 mcg by mouth daily.    Historical Provider, MD  lidocaine (LIDODERM) 5 % Place 1 patch onto the skin daily. Remove & Discard patch within 12 hours or as directed by MD 10/20/14   Margarita Mail, PA-C  metoprolol (LOPRESSOR) 50 MG tablet Take 50 mg by mouth 2 (two) times daily.    Historical Provider, MD  mometasone (NASONEX) 50 MCG/ACT nasal spray Place 2 sprays into the nose daily. 10/11/14   Orpah Greek, MD  simvastatin (ZOCOR) 20 MG tablet Take 20 mg by mouth 3 (three) times a week. mon wed fri    Historical Provider, MD  terconazole (TERAZOL 7) 0.4 % vaginal cream Place 1 applicator vaginally at bedtime.  02/19/13   Historical Provider, MD  valACYclovir (VALTREX) 1000 MG tablet Take 1,000 mg by mouth 3 (three) times daily as needed. For herpes simplex 07/17/12   Billy Fischer, MD   Triage Vitals: BP 145/69 mmHg  Pulse 71  Temp(Src) 98 F (36.7 C) (Oral)  Resp 18  SpO2 96% Physical Exam  Constitutional: She is oriented to person,  place, and time. She appears well-developed and well-nourished.  HENT:  Head: Normocephalic and atraumatic.  Eyes: EOM are normal.  Neck: Normal range of motion.  Cardiovascular: Normal rate.   Pulmonary/Chest: Effort normal.  Musculoskeletal: Normal range of motion. She exhibits tenderness.  Tender to palpation along lateral rib cage.  Neurological: She is alert and oriented to person, place, and time.  Skin: Skin is warm and dry.  Psychiatric: She has a normal mood and affect. Her behavior is normal.  Nursing note and vitals reviewed.   ED Course  Procedures (including critical care time) DIAGNOSTIC STUDIES: Oxygen Saturation is 96% on RA, adequate by my interpretation.   COORDINATION OF CARE: 1:03 PM- Will speak  with Dr. Ashok Cordia about appropriate course of treatment. Pt verbalizes understanding and agrees to plan.  1:11 PM- Dr. Ashok Cordia at bedside to examine patient. Will repeat a CXR.   Medications - No data to display  Labs Review Labs Reviewed - No data to display  Imaging Review No results found.   EKG Interpretation None      MDM   Final diagnoses:  Cough  Chest wall pain    Patient with apparent chest wall pain. Xray without abnormality.  Patient seen in shared visit with attending physician. Patient will be discharged with lidoderm and norco, dose renally., The patient appears reasonably screened and/or stabilized for discharge and I doubt any other medical condition or other [facility] requiring further screening, evaluation, or treatment in the ED at this time prior to discharge.   I personally performed the services described in this documentation, which was scribed in my presence. The recorded information has been reviewed and is accurate.    Margarita Mail, PA-C 10/22/14 [place], MD 10/24/14 [phone]

## 2014-10-20 NOTE — ED Notes (Addendum)
Pt complains of right back pain for the past 2 weeks. Pt states she injured her back coughing. Pt states the pain is worse with movement. Pt has been taking aleve, which she states provides some relief.

## 2014-12-10 DIAGNOSIS — E780 Pure hypercholesterolemia: Secondary | ICD-10-CM | POA: Diagnosis not present

## 2014-12-10 DIAGNOSIS — E1165 Type 2 diabetes mellitus with hyperglycemia: Secondary | ICD-10-CM | POA: Diagnosis not present

## 2014-12-10 DIAGNOSIS — E039 Hypothyroidism, unspecified: Secondary | ICD-10-CM | POA: Diagnosis not present

## 2014-12-19 DIAGNOSIS — E1165 Type 2 diabetes mellitus with hyperglycemia: Secondary | ICD-10-CM | POA: Diagnosis not present

## 2014-12-19 DIAGNOSIS — E780 Pure hypercholesterolemia: Secondary | ICD-10-CM | POA: Diagnosis not present

## 2014-12-19 DIAGNOSIS — E039 Hypothyroidism, unspecified: Secondary | ICD-10-CM | POA: Diagnosis not present

## 2014-12-19 DIAGNOSIS — I10 Essential (primary) hypertension: Secondary | ICD-10-CM | POA: Diagnosis not present

## 2015-01-07 DIAGNOSIS — H2513 Age-related nuclear cataract, bilateral: Secondary | ICD-10-CM | POA: Diagnosis not present

## 2015-01-07 DIAGNOSIS — E119 Type 2 diabetes mellitus without complications: Secondary | ICD-10-CM | POA: Diagnosis not present

## 2015-03-29 DIAGNOSIS — Z90710 Acquired absence of both cervix and uterus: Secondary | ICD-10-CM | POA: Diagnosis not present

## 2015-03-29 DIAGNOSIS — E1122 Type 2 diabetes mellitus with diabetic chronic kidney disease: Secondary | ICD-10-CM | POA: Diagnosis present

## 2015-03-29 DIAGNOSIS — E212 Other hyperparathyroidism: Secondary | ICD-10-CM | POA: Diagnosis present

## 2015-03-29 DIAGNOSIS — Z7952 Long term (current) use of systemic steroids: Secondary | ICD-10-CM | POA: Diagnosis not present

## 2015-03-29 DIAGNOSIS — Z6837 Body mass index (BMI) 37.0-37.9, adult: Secondary | ICD-10-CM | POA: Diagnosis not present

## 2015-03-29 DIAGNOSIS — Z7982 Long term (current) use of aspirin: Secondary | ICD-10-CM | POA: Diagnosis not present

## 2015-03-29 DIAGNOSIS — I82C21 Chronic embolism and thrombosis of right internal jugular vein: Secondary | ICD-10-CM | POA: Diagnosis present

## 2015-03-29 DIAGNOSIS — Z886 Allergy status to analgesic agent status: Secondary | ICD-10-CM | POA: Diagnosis not present

## 2015-03-29 DIAGNOSIS — Z79899 Other long term (current) drug therapy: Secondary | ICD-10-CM | POA: Diagnosis not present

## 2015-03-29 DIAGNOSIS — D649 Anemia, unspecified: Secondary | ICD-10-CM | POA: Diagnosis present

## 2015-03-29 DIAGNOSIS — Z87891 Personal history of nicotine dependence: Secondary | ICD-10-CM | POA: Diagnosis not present

## 2015-03-29 DIAGNOSIS — D696 Thrombocytopenia, unspecified: Secondary | ICD-10-CM | POA: Diagnosis present

## 2015-03-29 DIAGNOSIS — Z794 Long term (current) use of insulin: Secondary | ICD-10-CM | POA: Diagnosis not present

## 2015-03-29 DIAGNOSIS — I120 Hypertensive chronic kidney disease with stage 5 chronic kidney disease or end stage renal disease: Secondary | ICD-10-CM | POA: Diagnosis present

## 2015-03-29 DIAGNOSIS — Z8249 Family history of ischemic heart disease and other diseases of the circulatory system: Secondary | ICD-10-CM | POA: Diagnosis not present

## 2015-03-29 DIAGNOSIS — Z7983 Long term (current) use of bisphosphonates: Secondary | ICD-10-CM | POA: Diagnosis not present

## 2015-03-29 DIAGNOSIS — T8612 Kidney transplant failure: Secondary | ICD-10-CM | POA: Diagnosis present

## 2015-03-29 DIAGNOSIS — N186 End stage renal disease: Secondary | ICD-10-CM | POA: Diagnosis present

## 2015-03-29 DIAGNOSIS — Z992 Dependence on renal dialysis: Secondary | ICD-10-CM | POA: Diagnosis not present

## 2015-03-29 DIAGNOSIS — E1121 Type 2 diabetes mellitus with diabetic nephropathy: Secondary | ICD-10-CM | POA: Diagnosis present

## 2015-04-03 DIAGNOSIS — E785 Hyperlipidemia, unspecified: Secondary | ICD-10-CM | POA: Diagnosis not present

## 2015-04-03 DIAGNOSIS — D899 Disorder involving the immune mechanism, unspecified: Secondary | ICD-10-CM | POA: Diagnosis not present

## 2015-04-03 DIAGNOSIS — Z992 Dependence on renal dialysis: Secondary | ICD-10-CM | POA: Diagnosis not present

## 2015-04-03 DIAGNOSIS — R590 Localized enlarged lymph nodes: Secondary | ICD-10-CM | POA: Diagnosis not present

## 2015-04-03 DIAGNOSIS — D649 Anemia, unspecified: Secondary | ICD-10-CM | POA: Diagnosis not present

## 2015-04-03 DIAGNOSIS — Z940 Kidney transplant status: Secondary | ICD-10-CM | POA: Diagnosis not present

## 2015-04-03 DIAGNOSIS — E1122 Type 2 diabetes mellitus with diabetic chronic kidney disease: Secondary | ICD-10-CM | POA: Diagnosis not present

## 2015-04-03 DIAGNOSIS — I120 Hypertensive chronic kidney disease with stage 5 chronic kidney disease or end stage renal disease: Secondary | ICD-10-CM | POA: Diagnosis not present

## 2015-04-03 DIAGNOSIS — Z79899 Other long term (current) drug therapy: Secondary | ICD-10-CM | POA: Diagnosis not present

## 2015-04-03 DIAGNOSIS — I10 Essential (primary) hypertension: Secondary | ICD-10-CM | POA: Diagnosis not present

## 2015-04-03 DIAGNOSIS — E0829 Diabetes mellitus due to underlying condition with other diabetic kidney complication: Secondary | ICD-10-CM | POA: Diagnosis not present

## 2015-04-03 DIAGNOSIS — N186 End stage renal disease: Secondary | ICD-10-CM | POA: Diagnosis not present

## 2015-04-03 DIAGNOSIS — Z4822 Encounter for aftercare following kidney transplant: Secondary | ICD-10-CM | POA: Diagnosis not present

## 2015-04-03 DIAGNOSIS — Z794 Long term (current) use of insulin: Secondary | ICD-10-CM | POA: Diagnosis not present

## 2015-04-07 DIAGNOSIS — N186 End stage renal disease: Secondary | ICD-10-CM | POA: Diagnosis not present

## 2015-04-07 DIAGNOSIS — Z794 Long term (current) use of insulin: Secondary | ICD-10-CM | POA: Diagnosis not present

## 2015-04-07 DIAGNOSIS — Z792 Long term (current) use of antibiotics: Secondary | ICD-10-CM | POA: Diagnosis not present

## 2015-04-07 DIAGNOSIS — Z7982 Long term (current) use of aspirin: Secondary | ICD-10-CM | POA: Diagnosis not present

## 2015-04-07 DIAGNOSIS — E785 Hyperlipidemia, unspecified: Secondary | ICD-10-CM | POA: Diagnosis not present

## 2015-04-07 DIAGNOSIS — D631 Anemia in chronic kidney disease: Secondary | ICD-10-CM | POA: Diagnosis not present

## 2015-04-07 DIAGNOSIS — T814XXA Infection following a procedure, initial encounter: Secondary | ICD-10-CM | POA: Diagnosis not present

## 2015-04-07 DIAGNOSIS — E1122 Type 2 diabetes mellitus with diabetic chronic kidney disease: Secondary | ICD-10-CM | POA: Diagnosis not present

## 2015-04-07 DIAGNOSIS — D8989 Other specified disorders involving the immune mechanism, not elsewhere classified: Secondary | ICD-10-CM | POA: Diagnosis not present

## 2015-04-07 DIAGNOSIS — Z940 Kidney transplant status: Secondary | ICD-10-CM | POA: Diagnosis not present

## 2015-04-07 DIAGNOSIS — Z7952 Long term (current) use of systemic steroids: Secondary | ICD-10-CM | POA: Diagnosis not present

## 2015-04-07 DIAGNOSIS — Z86718 Personal history of other venous thrombosis and embolism: Secondary | ICD-10-CM | POA: Diagnosis not present

## 2015-04-07 DIAGNOSIS — R197 Diarrhea, unspecified: Secondary | ICD-10-CM | POA: Diagnosis not present

## 2015-04-07 DIAGNOSIS — E1121 Type 2 diabetes mellitus with diabetic nephropathy: Secondary | ICD-10-CM | POA: Diagnosis not present

## 2015-04-07 DIAGNOSIS — I120 Hypertensive chronic kidney disease with stage 5 chronic kidney disease or end stage renal disease: Secondary | ICD-10-CM | POA: Diagnosis not present

## 2015-04-07 DIAGNOSIS — Z4822 Encounter for aftercare following kidney transplant: Secondary | ICD-10-CM | POA: Diagnosis not present

## 2015-04-10 DIAGNOSIS — Z794 Long term (current) use of insulin: Secondary | ICD-10-CM | POA: Diagnosis not present

## 2015-04-10 DIAGNOSIS — R221 Localized swelling, mass and lump, neck: Secondary | ICD-10-CM | POA: Diagnosis not present

## 2015-04-10 DIAGNOSIS — D649 Anemia, unspecified: Secondary | ICD-10-CM | POA: Diagnosis not present

## 2015-04-10 DIAGNOSIS — I10 Essential (primary) hypertension: Secondary | ICD-10-CM | POA: Diagnosis not present

## 2015-04-10 DIAGNOSIS — N186 End stage renal disease: Secondary | ICD-10-CM | POA: Diagnosis not present

## 2015-04-10 DIAGNOSIS — Z4822 Encounter for aftercare following kidney transplant: Secondary | ICD-10-CM | POA: Diagnosis not present

## 2015-04-10 DIAGNOSIS — N185 Chronic kidney disease, stage 5: Secondary | ICD-10-CM | POA: Diagnosis not present

## 2015-04-10 DIAGNOSIS — I120 Hypertensive chronic kidney disease with stage 5 chronic kidney disease or end stage renal disease: Secondary | ICD-10-CM | POA: Diagnosis not present

## 2015-04-10 DIAGNOSIS — Z79899 Other long term (current) drug therapy: Secondary | ICD-10-CM | POA: Diagnosis not present

## 2015-04-10 DIAGNOSIS — E785 Hyperlipidemia, unspecified: Secondary | ICD-10-CM | POA: Diagnosis not present

## 2015-04-10 DIAGNOSIS — Z01818 Encounter for other preprocedural examination: Secondary | ICD-10-CM | POA: Diagnosis not present

## 2015-04-10 DIAGNOSIS — E119 Type 2 diabetes mellitus without complications: Secondary | ICD-10-CM | POA: Diagnosis not present

## 2015-04-10 DIAGNOSIS — Z992 Dependence on renal dialysis: Secondary | ICD-10-CM | POA: Diagnosis not present

## 2015-04-10 DIAGNOSIS — Z940 Kidney transplant status: Secondary | ICD-10-CM | POA: Diagnosis not present

## 2015-04-10 DIAGNOSIS — R599 Enlarged lymph nodes, unspecified: Secondary | ICD-10-CM | POA: Diagnosis not present

## 2015-04-10 DIAGNOSIS — E0829 Diabetes mellitus due to underlying condition with other diabetic kidney complication: Secondary | ICD-10-CM | POA: Diagnosis not present

## 2015-04-15 DIAGNOSIS — Z7982 Long term (current) use of aspirin: Secondary | ICD-10-CM | POA: Diagnosis not present

## 2015-04-15 DIAGNOSIS — Z940 Kidney transplant status: Secondary | ICD-10-CM | POA: Diagnosis not present

## 2015-04-15 DIAGNOSIS — E1122 Type 2 diabetes mellitus with diabetic chronic kidney disease: Secondary | ICD-10-CM | POA: Diagnosis not present

## 2015-04-15 DIAGNOSIS — E1140 Type 2 diabetes mellitus with diabetic neuropathy, unspecified: Secondary | ICD-10-CM | POA: Diagnosis not present

## 2015-04-15 DIAGNOSIS — Z4822 Encounter for aftercare following kidney transplant: Secondary | ICD-10-CM | POA: Diagnosis not present

## 2015-04-15 DIAGNOSIS — D649 Anemia, unspecified: Secondary | ICD-10-CM | POA: Diagnosis not present

## 2015-04-15 DIAGNOSIS — Z794 Long term (current) use of insulin: Secondary | ICD-10-CM | POA: Diagnosis not present

## 2015-04-15 DIAGNOSIS — N186 End stage renal disease: Secondary | ICD-10-CM | POA: Diagnosis not present

## 2015-04-15 DIAGNOSIS — Z79899 Other long term (current) drug therapy: Secondary | ICD-10-CM | POA: Diagnosis not present

## 2015-04-15 DIAGNOSIS — E785 Hyperlipidemia, unspecified: Secondary | ICD-10-CM | POA: Diagnosis not present

## 2015-04-15 DIAGNOSIS — Z992 Dependence on renal dialysis: Secondary | ICD-10-CM | POA: Diagnosis not present

## 2015-04-15 DIAGNOSIS — D8989 Other specified disorders involving the immune mechanism, not elsewhere classified: Secondary | ICD-10-CM | POA: Diagnosis not present

## 2015-04-15 DIAGNOSIS — I120 Hypertensive chronic kidney disease with stage 5 chronic kidney disease or end stage renal disease: Secondary | ICD-10-CM | POA: Diagnosis not present

## 2015-04-17 DIAGNOSIS — I129 Hypertensive chronic kidney disease with stage 1 through stage 4 chronic kidney disease, or unspecified chronic kidney disease: Secondary | ICD-10-CM | POA: Diagnosis not present

## 2015-04-17 DIAGNOSIS — Z940 Kidney transplant status: Secondary | ICD-10-CM | POA: Diagnosis not present

## 2015-04-17 DIAGNOSIS — Z792 Long term (current) use of antibiotics: Secondary | ICD-10-CM | POA: Diagnosis not present

## 2015-04-17 DIAGNOSIS — E1121 Type 2 diabetes mellitus with diabetic nephropathy: Secondary | ICD-10-CM | POA: Diagnosis not present

## 2015-04-17 DIAGNOSIS — I120 Hypertensive chronic kidney disease with stage 5 chronic kidney disease or end stage renal disease: Secondary | ICD-10-CM | POA: Diagnosis not present

## 2015-04-17 DIAGNOSIS — Z79899 Other long term (current) drug therapy: Secondary | ICD-10-CM | POA: Diagnosis not present

## 2015-04-17 DIAGNOSIS — Z7952 Long term (current) use of systemic steroids: Secondary | ICD-10-CM | POA: Diagnosis not present

## 2015-04-17 DIAGNOSIS — Z794 Long term (current) use of insulin: Secondary | ICD-10-CM | POA: Diagnosis not present

## 2015-04-17 DIAGNOSIS — D8989 Other specified disorders involving the immune mechanism, not elsewhere classified: Secondary | ICD-10-CM | POA: Diagnosis not present

## 2015-04-17 DIAGNOSIS — Z4822 Encounter for aftercare following kidney transplant: Secondary | ICD-10-CM | POA: Diagnosis not present

## 2015-04-17 DIAGNOSIS — E108 Type 1 diabetes mellitus with unspecified complications: Secondary | ICD-10-CM | POA: Diagnosis not present

## 2015-04-17 DIAGNOSIS — N186 End stage renal disease: Secondary | ICD-10-CM | POA: Diagnosis not present

## 2015-04-17 DIAGNOSIS — D649 Anemia, unspecified: Secondary | ICD-10-CM | POA: Diagnosis not present

## 2015-04-17 DIAGNOSIS — E1122 Type 2 diabetes mellitus with diabetic chronic kidney disease: Secondary | ICD-10-CM | POA: Diagnosis not present

## 2015-04-17 DIAGNOSIS — N189 Chronic kidney disease, unspecified: Secondary | ICD-10-CM | POA: Diagnosis not present

## 2015-04-17 DIAGNOSIS — Z7982 Long term (current) use of aspirin: Secondary | ICD-10-CM | POA: Diagnosis not present

## 2015-04-17 DIAGNOSIS — E785 Hyperlipidemia, unspecified: Secondary | ICD-10-CM | POA: Diagnosis not present

## 2015-04-21 DIAGNOSIS — I120 Hypertensive chronic kidney disease with stage 5 chronic kidney disease or end stage renal disease: Secondary | ICD-10-CM | POA: Diagnosis not present

## 2015-04-21 DIAGNOSIS — Z940 Kidney transplant status: Secondary | ICD-10-CM | POA: Diagnosis not present

## 2015-04-21 DIAGNOSIS — D8989 Other specified disorders involving the immune mechanism, not elsewhere classified: Secondary | ICD-10-CM | POA: Diagnosis not present

## 2015-04-21 DIAGNOSIS — Z79899 Other long term (current) drug therapy: Secondary | ICD-10-CM | POA: Diagnosis not present

## 2015-04-21 DIAGNOSIS — Z794 Long term (current) use of insulin: Secondary | ICD-10-CM | POA: Diagnosis not present

## 2015-04-21 DIAGNOSIS — I10 Essential (primary) hypertension: Secondary | ICD-10-CM | POA: Diagnosis not present

## 2015-04-21 DIAGNOSIS — Z4822 Encounter for aftercare following kidney transplant: Secondary | ICD-10-CM | POA: Diagnosis not present

## 2015-04-21 DIAGNOSIS — D649 Anemia, unspecified: Secondary | ICD-10-CM | POA: Diagnosis not present

## 2015-04-21 DIAGNOSIS — N185 Chronic kidney disease, stage 5: Secondary | ICD-10-CM | POA: Diagnosis not present

## 2015-04-21 DIAGNOSIS — E1165 Type 2 diabetes mellitus with hyperglycemia: Secondary | ICD-10-CM | POA: Diagnosis not present

## 2015-04-21 DIAGNOSIS — E785 Hyperlipidemia, unspecified: Secondary | ICD-10-CM | POA: Diagnosis not present

## 2015-04-21 DIAGNOSIS — E119 Type 2 diabetes mellitus without complications: Secondary | ICD-10-CM | POA: Diagnosis not present

## 2015-04-24 DIAGNOSIS — Z940 Kidney transplant status: Secondary | ICD-10-CM | POA: Diagnosis not present

## 2015-04-24 DIAGNOSIS — I10 Essential (primary) hypertension: Secondary | ICD-10-CM | POA: Diagnosis not present

## 2015-04-24 DIAGNOSIS — Z79899 Other long term (current) drug therapy: Secondary | ICD-10-CM | POA: Diagnosis not present

## 2015-04-24 DIAGNOSIS — Z7952 Long term (current) use of systemic steroids: Secondary | ICD-10-CM | POA: Diagnosis not present

## 2015-04-24 DIAGNOSIS — Z7982 Long term (current) use of aspirin: Secondary | ICD-10-CM | POA: Diagnosis not present

## 2015-04-24 DIAGNOSIS — Z794 Long term (current) use of insulin: Secondary | ICD-10-CM | POA: Diagnosis not present

## 2015-04-24 DIAGNOSIS — E119 Type 2 diabetes mellitus without complications: Secondary | ICD-10-CM | POA: Diagnosis not present

## 2015-04-24 DIAGNOSIS — E1165 Type 2 diabetes mellitus with hyperglycemia: Secondary | ICD-10-CM | POA: Diagnosis not present

## 2015-04-24 DIAGNOSIS — Z792 Long term (current) use of antibiotics: Secondary | ICD-10-CM | POA: Diagnosis not present

## 2015-04-24 DIAGNOSIS — D849 Immunodeficiency, unspecified: Secondary | ICD-10-CM | POA: Insufficient documentation

## 2015-04-24 DIAGNOSIS — D8989 Other specified disorders involving the immune mechanism, not elsewhere classified: Secondary | ICD-10-CM | POA: Diagnosis not present

## 2015-04-24 DIAGNOSIS — D899 Disorder involving the immune mechanism, unspecified: Secondary | ICD-10-CM | POA: Diagnosis not present

## 2015-04-24 DIAGNOSIS — Z4822 Encounter for aftercare following kidney transplant: Secondary | ICD-10-CM | POA: Diagnosis not present

## 2015-05-06 DIAGNOSIS — E7800 Pure hypercholesterolemia, unspecified: Secondary | ICD-10-CM | POA: Insufficient documentation

## 2015-05-06 DIAGNOSIS — N2581 Secondary hyperparathyroidism of renal origin: Secondary | ICD-10-CM | POA: Insufficient documentation

## 2015-05-06 DIAGNOSIS — E669 Obesity, unspecified: Secondary | ICD-10-CM | POA: Diagnosis not present

## 2015-05-06 DIAGNOSIS — Z992 Dependence on renal dialysis: Secondary | ICD-10-CM | POA: Insufficient documentation

## 2015-05-06 DIAGNOSIS — Z794 Long term (current) use of insulin: Secondary | ICD-10-CM | POA: Diagnosis not present

## 2015-05-06 DIAGNOSIS — D8989 Other specified disorders involving the immune mechanism, not elsewhere classified: Secondary | ICD-10-CM | POA: Diagnosis not present

## 2015-05-06 DIAGNOSIS — Z885 Allergy status to narcotic agent status: Secondary | ICD-10-CM | POA: Diagnosis not present

## 2015-05-06 DIAGNOSIS — E108 Type 1 diabetes mellitus with unspecified complications: Secondary | ICD-10-CM | POA: Diagnosis not present

## 2015-05-06 DIAGNOSIS — Z940 Kidney transplant status: Secondary | ICD-10-CM | POA: Diagnosis not present

## 2015-05-06 DIAGNOSIS — E119 Type 2 diabetes mellitus without complications: Secondary | ICD-10-CM | POA: Diagnosis not present

## 2015-05-06 DIAGNOSIS — I10 Essential (primary) hypertension: Secondary | ICD-10-CM | POA: Diagnosis not present

## 2015-05-06 DIAGNOSIS — E785 Hyperlipidemia, unspecified: Secondary | ICD-10-CM | POA: Insufficient documentation

## 2015-05-06 DIAGNOSIS — Z4822 Encounter for aftercare following kidney transplant: Secondary | ICD-10-CM | POA: Diagnosis not present

## 2015-05-06 DIAGNOSIS — Z79899 Other long term (current) drug therapy: Secondary | ICD-10-CM | POA: Diagnosis not present

## 2015-05-09 DIAGNOSIS — Z4822 Encounter for aftercare following kidney transplant: Secondary | ICD-10-CM | POA: Diagnosis not present

## 2015-05-09 DIAGNOSIS — N186 End stage renal disease: Secondary | ICD-10-CM | POA: Diagnosis not present

## 2015-05-09 DIAGNOSIS — Z940 Kidney transplant status: Secondary | ICD-10-CM | POA: Diagnosis not present

## 2015-05-13 DIAGNOSIS — E108 Type 1 diabetes mellitus with unspecified complications: Secondary | ICD-10-CM | POA: Diagnosis not present

## 2015-05-13 DIAGNOSIS — N186 End stage renal disease: Secondary | ICD-10-CM | POA: Diagnosis not present

## 2015-05-13 DIAGNOSIS — Z940 Kidney transplant status: Secondary | ICD-10-CM | POA: Diagnosis not present

## 2015-05-13 DIAGNOSIS — Z4822 Encounter for aftercare following kidney transplant: Secondary | ICD-10-CM | POA: Diagnosis not present

## 2015-05-13 DIAGNOSIS — E1122 Type 2 diabetes mellitus with diabetic chronic kidney disease: Secondary | ICD-10-CM | POA: Diagnosis not present

## 2015-05-13 DIAGNOSIS — D8989 Other specified disorders involving the immune mechanism, not elsewhere classified: Secondary | ICD-10-CM | POA: Diagnosis not present

## 2015-05-13 DIAGNOSIS — I120 Hypertensive chronic kidney disease with stage 5 chronic kidney disease or end stage renal disease: Secondary | ICD-10-CM | POA: Diagnosis not present

## 2015-05-13 DIAGNOSIS — Z7952 Long term (current) use of systemic steroids: Secondary | ICD-10-CM | POA: Diagnosis not present

## 2015-05-13 DIAGNOSIS — Z7982 Long term (current) use of aspirin: Secondary | ICD-10-CM | POA: Diagnosis not present

## 2015-05-13 DIAGNOSIS — D649 Anemia, unspecified: Secondary | ICD-10-CM | POA: Diagnosis not present

## 2015-05-13 DIAGNOSIS — N12 Tubulo-interstitial nephritis, not specified as acute or chronic: Secondary | ICD-10-CM | POA: Diagnosis not present

## 2015-05-13 DIAGNOSIS — Z794 Long term (current) use of insulin: Secondary | ICD-10-CM | POA: Diagnosis not present

## 2015-05-13 DIAGNOSIS — E7800 Pure hypercholesterolemia, unspecified: Secondary | ICD-10-CM | POA: Diagnosis not present

## 2015-05-13 DIAGNOSIS — Z79899 Other long term (current) drug therapy: Secondary | ICD-10-CM | POA: Diagnosis not present

## 2015-05-20 DIAGNOSIS — Z6830 Body mass index (BMI) 30.0-30.9, adult: Secondary | ICD-10-CM | POA: Diagnosis not present

## 2015-05-20 DIAGNOSIS — N189 Chronic kidney disease, unspecified: Secondary | ICD-10-CM | POA: Diagnosis not present

## 2015-05-20 DIAGNOSIS — E785 Hyperlipidemia, unspecified: Secondary | ICD-10-CM | POA: Diagnosis not present

## 2015-05-20 DIAGNOSIS — N12 Tubulo-interstitial nephritis, not specified as acute or chronic: Secondary | ICD-10-CM | POA: Diagnosis not present

## 2015-05-20 DIAGNOSIS — D8989 Other specified disorders involving the immune mechanism, not elsewhere classified: Secondary | ICD-10-CM | POA: Diagnosis not present

## 2015-05-20 DIAGNOSIS — E1122 Type 2 diabetes mellitus with diabetic chronic kidney disease: Secondary | ICD-10-CM | POA: Diagnosis not present

## 2015-05-20 DIAGNOSIS — Z794 Long term (current) use of insulin: Secondary | ICD-10-CM | POA: Diagnosis not present

## 2015-05-20 DIAGNOSIS — Z4822 Encounter for aftercare following kidney transplant: Secondary | ICD-10-CM | POA: Diagnosis not present

## 2015-05-20 DIAGNOSIS — D631 Anemia in chronic kidney disease: Secondary | ICD-10-CM | POA: Diagnosis not present

## 2015-05-20 DIAGNOSIS — E119 Type 2 diabetes mellitus without complications: Secondary | ICD-10-CM | POA: Diagnosis not present

## 2015-05-20 DIAGNOSIS — Z940 Kidney transplant status: Secondary | ICD-10-CM | POA: Diagnosis not present

## 2015-05-20 DIAGNOSIS — Z79899 Other long term (current) drug therapy: Secondary | ICD-10-CM | POA: Diagnosis not present

## 2015-05-20 DIAGNOSIS — E7800 Pure hypercholesterolemia, unspecified: Secondary | ICD-10-CM | POA: Diagnosis not present

## 2015-05-20 DIAGNOSIS — Z992 Dependence on renal dialysis: Secondary | ICD-10-CM | POA: Diagnosis not present

## 2015-05-20 DIAGNOSIS — I120 Hypertensive chronic kidney disease with stage 5 chronic kidney disease or end stage renal disease: Secondary | ICD-10-CM | POA: Diagnosis not present

## 2015-05-20 DIAGNOSIS — E669 Obesity, unspecified: Secondary | ICD-10-CM | POA: Diagnosis not present

## 2015-06-02 DIAGNOSIS — N2581 Secondary hyperparathyroidism of renal origin: Secondary | ICD-10-CM | POA: Diagnosis not present

## 2015-06-02 DIAGNOSIS — E785 Hyperlipidemia, unspecified: Secondary | ICD-10-CM | POA: Diagnosis not present

## 2015-06-02 DIAGNOSIS — Z792 Long term (current) use of antibiotics: Secondary | ICD-10-CM | POA: Diagnosis not present

## 2015-06-02 DIAGNOSIS — N12 Tubulo-interstitial nephritis, not specified as acute or chronic: Secondary | ICD-10-CM | POA: Diagnosis not present

## 2015-06-02 DIAGNOSIS — D8989 Other specified disorders involving the immune mechanism, not elsewhere classified: Secondary | ICD-10-CM | POA: Diagnosis not present

## 2015-06-02 DIAGNOSIS — Z79899 Other long term (current) drug therapy: Secondary | ICD-10-CM | POA: Diagnosis not present

## 2015-06-02 DIAGNOSIS — E119 Type 2 diabetes mellitus without complications: Secondary | ICD-10-CM | POA: Diagnosis not present

## 2015-06-02 DIAGNOSIS — E669 Obesity, unspecified: Secondary | ICD-10-CM | POA: Diagnosis not present

## 2015-06-02 DIAGNOSIS — D899 Disorder involving the immune mechanism, unspecified: Secondary | ICD-10-CM | POA: Diagnosis not present

## 2015-06-02 DIAGNOSIS — Z794 Long term (current) use of insulin: Secondary | ICD-10-CM | POA: Diagnosis not present

## 2015-06-02 DIAGNOSIS — Z7982 Long term (current) use of aspirin: Secondary | ICD-10-CM | POA: Diagnosis not present

## 2015-06-02 DIAGNOSIS — E7800 Pure hypercholesterolemia, unspecified: Secondary | ICD-10-CM | POA: Diagnosis not present

## 2015-06-02 DIAGNOSIS — Z940 Kidney transplant status: Secondary | ICD-10-CM | POA: Diagnosis not present

## 2015-06-02 DIAGNOSIS — D509 Iron deficiency anemia, unspecified: Secondary | ICD-10-CM | POA: Diagnosis not present

## 2015-06-02 DIAGNOSIS — Z7952 Long term (current) use of systemic steroids: Secondary | ICD-10-CM | POA: Diagnosis not present

## 2015-06-02 DIAGNOSIS — I10 Essential (primary) hypertension: Secondary | ICD-10-CM | POA: Diagnosis not present

## 2015-06-02 DIAGNOSIS — Z4822 Encounter for aftercare following kidney transplant: Secondary | ICD-10-CM | POA: Diagnosis not present

## 2015-06-16 DIAGNOSIS — E785 Hyperlipidemia, unspecified: Secondary | ICD-10-CM | POA: Diagnosis not present

## 2015-06-16 DIAGNOSIS — Z794 Long term (current) use of insulin: Secondary | ICD-10-CM | POA: Diagnosis not present

## 2015-06-16 DIAGNOSIS — Z940 Kidney transplant status: Secondary | ICD-10-CM | POA: Diagnosis not present

## 2015-06-16 DIAGNOSIS — D899 Disorder involving the immune mechanism, unspecified: Secondary | ICD-10-CM | POA: Diagnosis not present

## 2015-06-16 DIAGNOSIS — N186 End stage renal disease: Secondary | ICD-10-CM | POA: Diagnosis not present

## 2015-06-16 DIAGNOSIS — I120 Hypertensive chronic kidney disease with stage 5 chronic kidney disease or end stage renal disease: Secondary | ICD-10-CM | POA: Diagnosis not present

## 2015-06-16 DIAGNOSIS — Z4822 Encounter for aftercare following kidney transplant: Secondary | ICD-10-CM | POA: Diagnosis not present

## 2015-06-16 DIAGNOSIS — E1122 Type 2 diabetes mellitus with diabetic chronic kidney disease: Secondary | ICD-10-CM | POA: Diagnosis not present

## 2015-06-16 DIAGNOSIS — D8989 Other specified disorders involving the immune mechanism, not elsewhere classified: Secondary | ICD-10-CM | POA: Diagnosis not present

## 2015-06-16 DIAGNOSIS — Z79899 Other long term (current) drug therapy: Secondary | ICD-10-CM | POA: Diagnosis not present

## 2015-06-16 DIAGNOSIS — D631 Anemia in chronic kidney disease: Secondary | ICD-10-CM | POA: Diagnosis not present

## 2015-06-19 DIAGNOSIS — E1165 Type 2 diabetes mellitus with hyperglycemia: Secondary | ICD-10-CM | POA: Diagnosis not present

## 2015-06-19 DIAGNOSIS — E039 Hypothyroidism, unspecified: Secondary | ICD-10-CM | POA: Diagnosis not present

## 2015-06-19 DIAGNOSIS — E7800 Pure hypercholesterolemia, unspecified: Secondary | ICD-10-CM | POA: Diagnosis not present

## 2015-06-26 DIAGNOSIS — I10 Essential (primary) hypertension: Secondary | ICD-10-CM | POA: Diagnosis not present

## 2015-06-26 DIAGNOSIS — E1165 Type 2 diabetes mellitus with hyperglycemia: Secondary | ICD-10-CM | POA: Diagnosis not present

## 2015-06-26 DIAGNOSIS — E7800 Pure hypercholesterolemia, unspecified: Secondary | ICD-10-CM | POA: Diagnosis not present

## 2015-06-26 DIAGNOSIS — E039 Hypothyroidism, unspecified: Secondary | ICD-10-CM | POA: Diagnosis not present

## 2015-07-14 DIAGNOSIS — D8989 Other specified disorders involving the immune mechanism, not elsewhere classified: Secondary | ICD-10-CM | POA: Diagnosis not present

## 2015-07-14 DIAGNOSIS — Z4822 Encounter for aftercare following kidney transplant: Secondary | ICD-10-CM | POA: Diagnosis not present

## 2015-07-14 DIAGNOSIS — E7800 Pure hypercholesterolemia, unspecified: Secondary | ICD-10-CM | POA: Diagnosis not present

## 2015-07-14 DIAGNOSIS — Z940 Kidney transplant status: Secondary | ICD-10-CM | POA: Diagnosis not present

## 2015-07-14 DIAGNOSIS — I10 Essential (primary) hypertension: Secondary | ICD-10-CM | POA: Diagnosis not present

## 2015-07-14 DIAGNOSIS — D899 Disorder involving the immune mechanism, unspecified: Secondary | ICD-10-CM | POA: Diagnosis not present

## 2015-07-14 DIAGNOSIS — Z794 Long term (current) use of insulin: Secondary | ICD-10-CM | POA: Diagnosis not present

## 2015-07-14 DIAGNOSIS — N186 End stage renal disease: Secondary | ICD-10-CM | POA: Diagnosis not present

## 2015-07-14 DIAGNOSIS — E785 Hyperlipidemia, unspecified: Secondary | ICD-10-CM | POA: Diagnosis not present

## 2015-07-14 DIAGNOSIS — D649 Anemia, unspecified: Secondary | ICD-10-CM | POA: Diagnosis not present

## 2015-07-14 DIAGNOSIS — Z7982 Long term (current) use of aspirin: Secondary | ICD-10-CM | POA: Diagnosis not present

## 2015-07-14 DIAGNOSIS — E669 Obesity, unspecified: Secondary | ICD-10-CM | POA: Diagnosis not present

## 2015-07-14 DIAGNOSIS — E1122 Type 2 diabetes mellitus with diabetic chronic kidney disease: Secondary | ICD-10-CM | POA: Diagnosis not present

## 2015-07-14 DIAGNOSIS — I120 Hypertensive chronic kidney disease with stage 5 chronic kidney disease or end stage renal disease: Secondary | ICD-10-CM | POA: Diagnosis not present

## 2015-07-14 DIAGNOSIS — Z23 Encounter for immunization: Secondary | ICD-10-CM | POA: Diagnosis not present

## 2015-07-14 DIAGNOSIS — Z992 Dependence on renal dialysis: Secondary | ICD-10-CM | POA: Diagnosis not present

## 2015-07-14 DIAGNOSIS — N2581 Secondary hyperparathyroidism of renal origin: Secondary | ICD-10-CM | POA: Diagnosis not present

## 2015-07-14 DIAGNOSIS — Z79899 Other long term (current) drug therapy: Secondary | ICD-10-CM | POA: Diagnosis not present

## 2015-07-14 DIAGNOSIS — E108 Type 1 diabetes mellitus with unspecified complications: Secondary | ICD-10-CM | POA: Diagnosis not present

## 2015-07-16 DIAGNOSIS — A0819 Acute gastroenteropathy due to other small round viruses: Secondary | ICD-10-CM | POA: Diagnosis not present

## 2015-07-16 DIAGNOSIS — R197 Diarrhea, unspecified: Secondary | ICD-10-CM | POA: Diagnosis not present

## 2015-07-16 DIAGNOSIS — Z5181 Encounter for therapeutic drug level monitoring: Secondary | ICD-10-CM | POA: Diagnosis not present

## 2015-07-16 DIAGNOSIS — Z87891 Personal history of nicotine dependence: Secondary | ICD-10-CM | POA: Diagnosis not present

## 2015-07-16 DIAGNOSIS — Z886 Allergy status to analgesic agent status: Secondary | ICD-10-CM | POA: Diagnosis not present

## 2015-07-16 DIAGNOSIS — D899 Disorder involving the immune mechanism, unspecified: Secondary | ICD-10-CM | POA: Diagnosis not present

## 2015-07-16 DIAGNOSIS — Z79899 Other long term (current) drug therapy: Secondary | ICD-10-CM | POA: Diagnosis not present

## 2015-07-16 DIAGNOSIS — R112 Nausea with vomiting, unspecified: Secondary | ICD-10-CM | POA: Diagnosis not present

## 2015-07-16 DIAGNOSIS — Z885 Allergy status to narcotic agent status: Secondary | ICD-10-CM | POA: Diagnosis not present

## 2015-07-16 DIAGNOSIS — Z794 Long term (current) use of insulin: Secondary | ICD-10-CM | POA: Diagnosis not present

## 2015-07-16 DIAGNOSIS — E1121 Type 2 diabetes mellitus with diabetic nephropathy: Secondary | ICD-10-CM | POA: Diagnosis not present

## 2015-07-16 DIAGNOSIS — Z4822 Encounter for aftercare following kidney transplant: Secondary | ICD-10-CM | POA: Diagnosis not present

## 2015-07-16 DIAGNOSIS — E119 Type 2 diabetes mellitus without complications: Secondary | ICD-10-CM | POA: Diagnosis not present

## 2015-07-16 DIAGNOSIS — I10 Essential (primary) hypertension: Secondary | ICD-10-CM | POA: Diagnosis not present

## 2015-07-16 DIAGNOSIS — Z940 Kidney transplant status: Secondary | ICD-10-CM | POA: Diagnosis not present

## 2015-07-16 DIAGNOSIS — R162 Hepatomegaly with splenomegaly, not elsewhere classified: Secondary | ICD-10-CM | POA: Diagnosis not present

## 2015-07-17 DIAGNOSIS — A0819 Acute gastroenteropathy due to other small round viruses: Secondary | ICD-10-CM | POA: Diagnosis not present

## 2015-07-17 DIAGNOSIS — Z885 Allergy status to narcotic agent status: Secondary | ICD-10-CM | POA: Diagnosis not present

## 2015-07-17 DIAGNOSIS — I10 Essential (primary) hypertension: Secondary | ICD-10-CM | POA: Diagnosis not present

## 2015-07-17 DIAGNOSIS — R162 Hepatomegaly with splenomegaly, not elsewhere classified: Secondary | ICD-10-CM | POA: Diagnosis not present

## 2015-07-17 DIAGNOSIS — E1121 Type 2 diabetes mellitus with diabetic nephropathy: Secondary | ICD-10-CM | POA: Diagnosis not present

## 2015-07-17 DIAGNOSIS — R112 Nausea with vomiting, unspecified: Secondary | ICD-10-CM | POA: Diagnosis not present

## 2015-07-17 DIAGNOSIS — Z794 Long term (current) use of insulin: Secondary | ICD-10-CM | POA: Diagnosis not present

## 2015-07-17 DIAGNOSIS — Z5181 Encounter for therapeutic drug level monitoring: Secondary | ICD-10-CM | POA: Diagnosis not present

## 2015-07-17 DIAGNOSIS — Z79899 Other long term (current) drug therapy: Secondary | ICD-10-CM | POA: Diagnosis not present

## 2015-07-17 DIAGNOSIS — A0811 Acute gastroenteropathy due to Norwalk agent: Secondary | ICD-10-CM | POA: Insufficient documentation

## 2015-07-17 DIAGNOSIS — E119 Type 2 diabetes mellitus without complications: Secondary | ICD-10-CM | POA: Diagnosis not present

## 2015-07-17 DIAGNOSIS — Z4822 Encounter for aftercare following kidney transplant: Secondary | ICD-10-CM | POA: Diagnosis not present

## 2015-07-17 DIAGNOSIS — R197 Diarrhea, unspecified: Secondary | ICD-10-CM | POA: Diagnosis not present

## 2015-07-17 DIAGNOSIS — Z940 Kidney transplant status: Secondary | ICD-10-CM | POA: Diagnosis not present

## 2015-07-22 DIAGNOSIS — Z794 Long term (current) use of insulin: Secondary | ICD-10-CM | POA: Diagnosis not present

## 2015-07-22 DIAGNOSIS — Z7982 Long term (current) use of aspirin: Secondary | ICD-10-CM | POA: Diagnosis not present

## 2015-07-22 DIAGNOSIS — E785 Hyperlipidemia, unspecified: Secondary | ICD-10-CM | POA: Diagnosis not present

## 2015-07-22 DIAGNOSIS — Z6839 Body mass index (BMI) 39.0-39.9, adult: Secondary | ICD-10-CM | POA: Diagnosis not present

## 2015-07-22 DIAGNOSIS — D8989 Other specified disorders involving the immune mechanism, not elsewhere classified: Secondary | ICD-10-CM | POA: Diagnosis not present

## 2015-07-22 DIAGNOSIS — Z940 Kidney transplant status: Secondary | ICD-10-CM | POA: Diagnosis not present

## 2015-07-22 DIAGNOSIS — Z4822 Encounter for aftercare following kidney transplant: Secondary | ICD-10-CM | POA: Diagnosis not present

## 2015-07-22 DIAGNOSIS — E7800 Pure hypercholesterolemia, unspecified: Secondary | ICD-10-CM | POA: Diagnosis not present

## 2015-07-22 DIAGNOSIS — E669 Obesity, unspecified: Secondary | ICD-10-CM | POA: Diagnosis not present

## 2015-07-22 DIAGNOSIS — I10 Essential (primary) hypertension: Secondary | ICD-10-CM | POA: Diagnosis not present

## 2015-07-22 DIAGNOSIS — D899 Disorder involving the immune mechanism, unspecified: Secondary | ICD-10-CM | POA: Diagnosis not present

## 2015-07-22 DIAGNOSIS — D649 Anemia, unspecified: Secondary | ICD-10-CM | POA: Diagnosis not present

## 2015-07-22 DIAGNOSIS — E119 Type 2 diabetes mellitus without complications: Secondary | ICD-10-CM | POA: Diagnosis not present

## 2015-07-22 DIAGNOSIS — Z79899 Other long term (current) drug therapy: Secondary | ICD-10-CM | POA: Diagnosis not present

## 2015-07-22 DIAGNOSIS — E1129 Type 2 diabetes mellitus with other diabetic kidney complication: Secondary | ICD-10-CM | POA: Diagnosis not present

## 2015-08-18 ENCOUNTER — Other Ambulatory Visit: Payer: Self-pay

## 2015-08-18 DIAGNOSIS — Z1231 Encounter for screening mammogram for malignant neoplasm of breast: Secondary | ICD-10-CM

## 2015-08-26 ENCOUNTER — Ambulatory Visit: Payer: Medicare Other | Admitting: Internal Medicine

## 2015-08-27 DIAGNOSIS — Z940 Kidney transplant status: Secondary | ICD-10-CM | POA: Diagnosis not present

## 2015-08-27 DIAGNOSIS — I120 Hypertensive chronic kidney disease with stage 5 chronic kidney disease or end stage renal disease: Secondary | ICD-10-CM | POA: Diagnosis not present

## 2015-08-27 DIAGNOSIS — N186 End stage renal disease: Secondary | ICD-10-CM | POA: Diagnosis not present

## 2015-08-27 DIAGNOSIS — Z79899 Other long term (current) drug therapy: Secondary | ICD-10-CM | POA: Diagnosis not present

## 2015-08-27 DIAGNOSIS — E119 Type 2 diabetes mellitus without complications: Secondary | ICD-10-CM | POA: Diagnosis not present

## 2015-08-27 DIAGNOSIS — Z7952 Long term (current) use of systemic steroids: Secondary | ICD-10-CM | POA: Diagnosis not present

## 2015-08-27 DIAGNOSIS — Z4822 Encounter for aftercare following kidney transplant: Secondary | ICD-10-CM | POA: Diagnosis not present

## 2015-08-27 DIAGNOSIS — E1122 Type 2 diabetes mellitus with diabetic chronic kidney disease: Secondary | ICD-10-CM | POA: Diagnosis not present

## 2015-08-27 DIAGNOSIS — Z8619 Personal history of other infectious and parasitic diseases: Secondary | ICD-10-CM | POA: Diagnosis not present

## 2015-08-27 DIAGNOSIS — Z792 Long term (current) use of antibiotics: Secondary | ICD-10-CM | POA: Diagnosis not present

## 2015-08-27 DIAGNOSIS — Z794 Long term (current) use of insulin: Secondary | ICD-10-CM | POA: Diagnosis not present

## 2015-08-27 DIAGNOSIS — Z7982 Long term (current) use of aspirin: Secondary | ICD-10-CM | POA: Diagnosis not present

## 2015-08-27 DIAGNOSIS — I10 Essential (primary) hypertension: Secondary | ICD-10-CM | POA: Diagnosis not present

## 2015-08-28 ENCOUNTER — Ambulatory Visit: Payer: Medicare Other

## 2015-09-02 ENCOUNTER — Ambulatory Visit
Admission: RE | Admit: 2015-09-02 | Discharge: 2015-09-02 | Disposition: A | Discharge status: Home / Self Care | Payer: Medicare Other | Source: Ambulatory Visit

## 2015-09-02 DIAGNOSIS — Z1231 Encounter for screening mammogram for malignant neoplasm of breast: Secondary | ICD-10-CM

## 2015-09-15 DIAGNOSIS — Z794 Long term (current) use of insulin: Secondary | ICD-10-CM | POA: Diagnosis not present

## 2015-09-15 DIAGNOSIS — E1122 Type 2 diabetes mellitus with diabetic chronic kidney disease: Secondary | ICD-10-CM | POA: Diagnosis not present

## 2015-09-15 DIAGNOSIS — D899 Disorder involving the immune mechanism, unspecified: Secondary | ICD-10-CM | POA: Diagnosis not present

## 2015-09-15 DIAGNOSIS — D8989 Other specified disorders involving the immune mechanism, not elsewhere classified: Secondary | ICD-10-CM | POA: Diagnosis not present

## 2015-09-15 DIAGNOSIS — Z79899 Other long term (current) drug therapy: Secondary | ICD-10-CM | POA: Diagnosis not present

## 2015-09-15 DIAGNOSIS — L988 Other specified disorders of the skin and subcutaneous tissue: Secondary | ICD-10-CM | POA: Diagnosis not present

## 2015-09-15 DIAGNOSIS — Z885 Allergy status to narcotic agent status: Secondary | ICD-10-CM | POA: Diagnosis not present

## 2015-09-15 DIAGNOSIS — I120 Hypertensive chronic kidney disease with stage 5 chronic kidney disease or end stage renal disease: Secondary | ICD-10-CM | POA: Diagnosis not present

## 2015-09-15 DIAGNOSIS — N186 End stage renal disease: Secondary | ICD-10-CM | POA: Diagnosis not present

## 2015-09-15 DIAGNOSIS — Z886 Allergy status to analgesic agent status: Secondary | ICD-10-CM | POA: Diagnosis not present

## 2015-09-15 DIAGNOSIS — Z940 Kidney transplant status: Secondary | ICD-10-CM | POA: Diagnosis not present

## 2015-09-15 DIAGNOSIS — Z7982 Long term (current) use of aspirin: Secondary | ICD-10-CM | POA: Diagnosis not present

## 2015-09-15 DIAGNOSIS — Z4822 Encounter for aftercare following kidney transplant: Secondary | ICD-10-CM | POA: Diagnosis not present

## 2015-09-15 DIAGNOSIS — M79606 Pain in leg, unspecified: Secondary | ICD-10-CM | POA: Diagnosis not present

## 2015-09-15 DIAGNOSIS — M25559 Pain in unspecified hip: Secondary | ICD-10-CM | POA: Diagnosis not present

## 2015-09-30 DIAGNOSIS — I120 Hypertensive chronic kidney disease with stage 5 chronic kidney disease or end stage renal disease: Secondary | ICD-10-CM | POA: Diagnosis not present

## 2015-09-30 DIAGNOSIS — Z79899 Other long term (current) drug therapy: Secondary | ICD-10-CM | POA: Diagnosis not present

## 2015-09-30 DIAGNOSIS — D899 Disorder involving the immune mechanism, unspecified: Secondary | ICD-10-CM | POA: Diagnosis not present

## 2015-09-30 DIAGNOSIS — Z794 Long term (current) use of insulin: Secondary | ICD-10-CM | POA: Diagnosis not present

## 2015-09-30 DIAGNOSIS — Z4822 Encounter for aftercare following kidney transplant: Secondary | ICD-10-CM | POA: Diagnosis not present

## 2015-09-30 DIAGNOSIS — N186 End stage renal disease: Secondary | ICD-10-CM | POA: Diagnosis not present

## 2015-09-30 DIAGNOSIS — Z886 Allergy status to analgesic agent status: Secondary | ICD-10-CM | POA: Diagnosis not present

## 2015-09-30 DIAGNOSIS — E1122 Type 2 diabetes mellitus with diabetic chronic kidney disease: Secondary | ICD-10-CM | POA: Diagnosis not present

## 2015-09-30 DIAGNOSIS — Z7982 Long term (current) use of aspirin: Secondary | ICD-10-CM | POA: Diagnosis not present

## 2015-09-30 DIAGNOSIS — Z885 Allergy status to narcotic agent status: Secondary | ICD-10-CM | POA: Diagnosis not present

## 2015-10-01 ENCOUNTER — Ambulatory Visit (INDEPENDENT_AMBULATORY_CARE_PROVIDER_SITE_OTHER): Payer: Medicare Other | Admitting: Internal Medicine

## 2015-10-01 ENCOUNTER — Encounter: Payer: Self-pay | Admitting: Internal Medicine

## 2015-10-01 VITALS — BP 158/88 | HR 66 | Temp 97.7°F | Resp 14 | Ht 63.0 in | Wt 209.4 lb

## 2015-10-01 DIAGNOSIS — E1122 Type 2 diabetes mellitus with diabetic chronic kidney disease: Secondary | ICD-10-CM | POA: Diagnosis not present

## 2015-10-01 DIAGNOSIS — E118 Type 2 diabetes mellitus with unspecified complications: Secondary | ICD-10-CM

## 2015-10-01 DIAGNOSIS — Z794 Long term (current) use of insulin: Secondary | ICD-10-CM | POA: Diagnosis not present

## 2015-10-01 DIAGNOSIS — N183 Chronic kidney disease, stage 3 unspecified: Secondary | ICD-10-CM

## 2015-10-01 DIAGNOSIS — E1165 Type 2 diabetes mellitus with hyperglycemia: Secondary | ICD-10-CM

## 2015-10-01 DIAGNOSIS — Z940 Kidney transplant status: Secondary | ICD-10-CM

## 2015-10-01 DIAGNOSIS — IMO0002 Reserved for concepts with insufficient information to code with codable children: Secondary | ICD-10-CM

## 2015-10-01 DIAGNOSIS — N185 Chronic kidney disease, stage 5: Secondary | ICD-10-CM

## 2015-10-01 NOTE — Patient Instructions (Signed)
We will get you in with the nutritionist to help you balance the kidney diet and the diabetes diet to help keep the sugars good.   Work on getting back into exercise as this helps with your sugars and your health.   Diabetes and Exercise Exercising regularly is important. It is not just about losing weight. It has many health benefits, such as:  Improving your overall fitness, flexibility, and endurance.  Increasing your bone density.  Helping with weight control.  Decreasing your body fat.  Increasing your muscle strength.  Reducing stress and tension.  Improving your overall health. People with diabetes who exercise gain additional benefits because exercise:  Reduces appetite.  Improves the body's use of blood sugar (glucose).  Helps lower or control blood glucose.  Decreases blood pressure.  Helps control blood lipids (such as cholesterol and triglycerides).  Improves the body's use of the hormone insulin by:  Increasing the body's insulin sensitivity.  Reducing the body's insulin needs.  Decreases the risk for heart disease because exercising:  Lowers cholesterol and triglycerides levels.  Increases the levels of good cholesterol (such as high-density lipoproteins [HDL]) in the body.  Lowers blood glucose levels. YOUR ACTIVITY PLAN  Choose an activity that you enjoy, and set realistic goals. To exercise safely, you should begin practicing any new physical activity slowly, and gradually increase the intensity of the exercise over time. Your health care provider or diabetes educator can help create an activity plan that works for you. General recommendations include:  Encouraging children to engage in at least 60 minutes of physical activity each day.  Stretching and performing strength training exercises, such as yoga or weight lifting, at least 2 times per week.  Performing a total of at least 150 minutes of moderate-intensity exercise each week, such as brisk  walking or water aerobics.  Exercising at least 3 days per week, making sure you allow no more than 2 consecutive days to pass without exercising.  Avoiding long periods of inactivity (90 minutes or more). When you have to spend an extended period of time sitting down, take frequent breaks to walk or stretch. RECOMMENDATIONS FOR EXERCISING WITH TYPE 1 OR TYPE 2 DIABETES   Check your blood glucose before exercising. If blood glucose levels are greater than 240 mg/dL, check for urine ketones. Do not exercise if ketones are present.  Avoid injecting insulin into areas of the body that are going to be exercised. For example, avoid injecting insulin into:  The arms when playing tennis.  The legs when jogging.  Keep a record of:  Food intake before and after you exercise.  Expected peak times of insulin action.  Blood glucose levels before and after you exercise.  The type and amount of exercise you have done.  Review your records with your health care provider. Your health care provider will help you to develop guidelines for adjusting food intake and insulin amounts before and after exercising.  If you take insulin or oral hypoglycemic agents, watch for signs and symptoms of hypoglycemia. They include:  Dizziness.  Shaking.  Sweating.  Chills.  Confusion.  Drink plenty of water while you exercise to prevent dehydration or heat stroke. Body water is lost during exercise and must be replaced.  Talk to your health care provider before starting an exercise program to make sure it is safe for you. Remember, almost any type of activity is better than none.   This information is not intended to replace advice given to you  by your health care provider. Make sure you discuss any questions you have with your health care provider.   Document Released: 10/16/2003 Document Revised: 12/10/2014 Document Reviewed: 01/02/2013 Elsevier Interactive Patient Education [employer].

## 2015-10-01 NOTE — Progress Notes (Signed)
Pre visit review using our clinic review tool, if applicable. No additional management support is needed unless otherwise documented below in the visit note. 

## 2015-10-01 NOTE — Progress Notes (Signed)
   Subjective:    Patient ID: [name] [name], female    DOB: 1955/12/25, 60 y.o.   MRN: [id number]  HPI The patient is a 60 YO female coming in new for her ongoing medical problems. She is s/p 2 kidney transplants (most recent in 03/2015). She is still seeing the transplant doctor and her local kidney doctor. She also has an endocrinologist (diabetes and htn thought to cause her kidney failure). She is not exercising right now and has a hard time balancing her kidney diet and the diabetes. She does not feel like she has a lot of knowledge about that. Her husband diagnosed with cancer in the last month and she is struggling with that now.   PMH, [facility], social history reviewed and updated.   Review of Systems  Constitutional: Positive for activity change. Negative for chills, appetite change, fatigue and unexpected weight change.       Not exercising  HENT: Negative.   Eyes: Negative.   Respiratory: Negative for cough, chest tightness, shortness of breath and wheezing.   Cardiovascular: Negative for chest pain, palpitations and leg swelling.  Gastrointestinal: Positive for diarrhea. Negative for nausea, vomiting, abdominal pain, constipation and abdominal distention.  Musculoskeletal: Negative for myalgias, back pain, arthralgias and gait problem.  Skin: Negative.   Neurological: Negative for dizziness, weakness, light-headedness, numbness and headaches.  Psychiatric/Behavioral: Negative.       Objective:   Physical Exam  Constitutional: She is oriented to person, place, and time. She appears well-developed and well-nourished.  Overweight  HENT:  Head: Normocephalic and atraumatic.  Right Ear: External ear normal.  Left Ear: External ear normal.  Eyes: EOM are normal.  Neck: Normal range of motion.  Cardiovascular: Normal rate and regular rhythm.   Pulmonary/Chest: Effort normal and breath sounds normal. No respiratory distress. She has no wheezes. She has no rales.  Abdominal:  Soft. Bowel sounds are normal. She exhibits no distension. There is no tenderness. There is no rebound.  Musculoskeletal: She exhibits no edema.  Right upper extremity vascular access with good thrill and bruit  Neurological: She is alert and oriented to person, place, and time. Coordination normal.  Skin: Skin is warm and dry.   Filed Vitals:   10/01/15 0913  BP: 158/88  Pulse: 66  Temp: 97.7 F (36.5 C)  TempSrc: Oral  Resp: 14  Height: 5\' 3"  (1.6 m)  Weight: 209 lb 6.4 oz (94.983 kg)  SpO2: 97%      Assessment & Plan:

## 2015-10-03 DIAGNOSIS — E1129 Type 2 diabetes mellitus with other diabetic kidney complication: Secondary | ICD-10-CM | POA: Insufficient documentation

## 2015-10-03 NOTE — Assessment & Plan Note (Signed)
Creatinine about 1.3 at her transplant center and appears to be stable. Talked about the importance of staying on her meds and monitoring to help with kidney duration of life. S/P 2 kidney transplants.

## 2015-10-03 NOTE — Assessment & Plan Note (Addendum)
S/P 2nd renal transplant August 2016 and is on rejection meds. Still seeing transplant center and nephrology. Most recent renal function stable. BP mildly elevated due to stress today. She informs me that it is typically at goal. On bactrim, prednisone, mycophenolate.

## 2015-10-03 NOTE — Assessment & Plan Note (Signed)
Taking NPH and Humalog to control her sugars. She did not bring her sugar log today but denies low sugars or high sugars. Reminded her about the importance of good control for protecting her new kidney.

## 2015-10-13 DIAGNOSIS — E119 Type 2 diabetes mellitus without complications: Secondary | ICD-10-CM | POA: Diagnosis not present

## 2015-10-13 DIAGNOSIS — Z7952 Long term (current) use of systemic steroids: Secondary | ICD-10-CM | POA: Diagnosis not present

## 2015-10-13 DIAGNOSIS — Z7982 Long term (current) use of aspirin: Secondary | ICD-10-CM | POA: Diagnosis not present

## 2015-10-13 DIAGNOSIS — E1129 Type 2 diabetes mellitus with other diabetic kidney complication: Secondary | ICD-10-CM | POA: Diagnosis not present

## 2015-10-13 DIAGNOSIS — Z794 Long term (current) use of insulin: Secondary | ICD-10-CM | POA: Diagnosis not present

## 2015-10-13 DIAGNOSIS — M7989 Other specified soft tissue disorders: Secondary | ICD-10-CM | POA: Diagnosis not present

## 2015-10-13 DIAGNOSIS — E1122 Type 2 diabetes mellitus with diabetic chronic kidney disease: Secondary | ICD-10-CM | POA: Diagnosis not present

## 2015-10-13 DIAGNOSIS — Z79899 Other long term (current) drug therapy: Secondary | ICD-10-CM | POA: Diagnosis not present

## 2015-10-13 DIAGNOSIS — I10 Essential (primary) hypertension: Secondary | ICD-10-CM | POA: Diagnosis not present

## 2015-10-13 DIAGNOSIS — Z886 Allergy status to analgesic agent status: Secondary | ICD-10-CM | POA: Diagnosis not present

## 2015-10-13 DIAGNOSIS — Z885 Allergy status to narcotic agent status: Secondary | ICD-10-CM | POA: Diagnosis not present

## 2015-10-13 DIAGNOSIS — Z940 Kidney transplant status: Secondary | ICD-10-CM | POA: Diagnosis not present

## 2015-10-13 DIAGNOSIS — E669 Obesity, unspecified: Secondary | ICD-10-CM | POA: Diagnosis not present

## 2015-10-13 DIAGNOSIS — Z4822 Encounter for aftercare following kidney transplant: Secondary | ICD-10-CM | POA: Diagnosis not present

## 2015-10-13 DIAGNOSIS — D899 Disorder involving the immune mechanism, unspecified: Secondary | ICD-10-CM | POA: Diagnosis not present

## 2015-10-13 DIAGNOSIS — N186 End stage renal disease: Secondary | ICD-10-CM | POA: Diagnosis not present

## 2015-10-13 DIAGNOSIS — N2581 Secondary hyperparathyroidism of renal origin: Secondary | ICD-10-CM | POA: Diagnosis not present

## 2015-10-13 DIAGNOSIS — I120 Hypertensive chronic kidney disease with stage 5 chronic kidney disease or end stage renal disease: Secondary | ICD-10-CM | POA: Diagnosis not present

## 2015-10-16 DIAGNOSIS — I770 Arteriovenous fistula, acquired: Secondary | ICD-10-CM | POA: Diagnosis not present

## 2015-10-16 DIAGNOSIS — I808 Phlebitis and thrombophlebitis of other sites: Secondary | ICD-10-CM | POA: Diagnosis not present

## 2015-10-28 ENCOUNTER — Encounter: Payer: Self-pay | Admitting: *Deleted

## 2015-10-28 ENCOUNTER — Encounter: Payer: Medicare Other | Attending: Internal Medicine | Admitting: *Deleted

## 2015-10-28 VITALS — Ht 62.0 in | Wt 213.5 lb

## 2015-10-28 DIAGNOSIS — Z794 Long term (current) use of insulin: Secondary | ICD-10-CM | POA: Diagnosis not present

## 2015-10-28 DIAGNOSIS — E1165 Type 2 diabetes mellitus with hyperglycemia: Secondary | ICD-10-CM | POA: Insufficient documentation

## 2015-10-28 DIAGNOSIS — E118 Type 2 diabetes mellitus with unspecified complications: Secondary | ICD-10-CM | POA: Diagnosis not present

## 2015-10-28 DIAGNOSIS — E119 Type 2 diabetes mellitus without complications: Secondary | ICD-10-CM

## 2015-10-28 NOTE — Progress Notes (Signed)
Diabetes Self-Management Education  Visit Type: First/Initial  Appt. Start Time: 0800 Appt. End Time: 0930  10/28/2015  [name], identified by name and date of birth, is a 60 y.o. female with a diagnosis of Diabetes: Type 2. Alzira presents for education on diabetes management. She has had education several years ago. Managing her [id number] has been complicated by Renal Transplant X2. Her most recent transplant was 03/2015. She remains on steroids which impacts her glucose readings. How that her renal challenges are under control she is ready to focus on the management of her T2DM. She is also challenged by family stress. Her husband has recently been diagnosed with Rectal Cancer and is undergoing chemotherapy. Her two daughter and their children are living in the family home while they "get their finances in order" to enable them to live independently. Jade is in need of some time to focus on herself. She plans to have a family meeting to discuss her returning to her exercise class and the types of food that are brought into the house. Her family is supportive of helping manage her diabetes.  ASSESSMENT  Height 5\' 2"  (1.575 m), weight 213 lb 8 oz (96.843 kg). Body mass index is 39.04 kg/(m^2).      Diabetes Self-Management Education - 10/28/15 0935    Visit Information   Visit Type First/Initial   Initial Visit   Diabetes Type Type 2   Are you currently following a meal plan? Yes   What type of meal plan do you follow? Post transplant   Are you taking your medications as prescribed? Yes   Date Diagnosed 2004   Health Coping   How would you rate your overall health? Good   Psychosocial Assessment   Patient Belief/Attitude about Diabetes Motivated to manage diabetes   Self-care barriers None   Self-management support Doctor's office;Family;CDE visits   Other persons present Patient   Patient Concerns Nutrition/Meal planning;Healthy Lifestyle;Glycemic Control;Weight  Control   Special Needs None   Preferred Learning Style No preference indicated   Learning Readiness Change in progress   How often do you need to have someone help you when you read instructions, pamphlets, or other written materials from your doctor or pharmacy? 2 - Rarely   What is the last grade level you completed in school? Masters degree   Complications   Last HgB A1C per patient/outside source 8 %   How often do you check your blood sugar? 1-2 times/day   Fasting Blood glucose range (mg/dL) 130-179;>200  FBS 174-179              before dinner 180-190 with rare >200   Number of hypoglycemic episodes per month 0   Have you had a dilated eye exam in the past 12 months? Yes   Have you had a dental exam in the past 12 months? Yes   Are you checking your feet? Yes   How many days per week are you checking your feet? 3   Dietary Intake   Breakfast boiled egg, toast, bacon or sausage, coffee or OJ / oatmeal, coffee or juice & fruit / pancakes, syrup, bacon or sausage, water / grilled cheese sandwich bacon or sausage/ sausage biscuit, OJ, water   Lunch salad greens romaine, vegetables, meat, crackers /  sandwich ham & cheese, sliced [place],  chips or carrots and ranch dip   Dinner baked chicken & rice, green vegetable, / spaghetti & meatsauce, itallian bread, sweet tea or lemonade, pork chop and vegetables  Snack (evening) carrots & dip, cookie(vanilla wafer), oatmeal cakes, raisin cakes, chips   Exercise   Exercise Type Light (walking / raking leaves)   How many days per week to you exercise? 1   How many minutes per day do you exercise? 15   Total minutes per week of exercise 15   Patient Education   Previous Diabetes Education Yes (please comment)   Disease state  Factors that contribute to the development of diabetes   Nutrition management  Role of diet in the treatment of diabetes and the relationship between the three main macronutrients and blood glucose level;Carbohydrate  counting;Information on hints to eating out and maintain blood glucose control.   Physical activity and exercise  Role of exercise on diabetes management, blood pressure control and cardiac health.;Helped patient identify appropriate exercises in relation to his/her diabetes, diabetes complications and other health issue.   Monitoring Purpose and frequency of SMBG.;Identified appropriate SMBG and/or A1C goals.   Chronic complications Relationship between chronic complications and blood glucose control   Psychosocial adjustment Role of stress on diabetes;Worked with patient to identify barriers to care and solutions;Helped patient identify a support system for diabetes management;Brainstormed with patient on coping mechanisms for social situations, getting support from significant others, dealing with feelings about diabetes   Personal strategies to promote health Lifestyle issues that need to be addressed for better diabetes care;Helped patient develop diabetes management plan for (enter comment)   Individualized Goals (developed by patient)   Nutrition General guidelines for healthy choices and portions discussed   Physical Activity Exercise 1-2 times per week;30 minutes per day   Monitoring  test my blood glucose as discussed   Reducing Risk increase portions of nuts and seeds;increase portions of olive oil in diet   Outcomes   Expected Outcomes Demonstrated interest in learning. Expect positive outcomes   Future DMSE 2 months   Program Status Not Completed      Individualized Plan for Diabetes Self-Management Training:   Learning Objective:  Patient will have a greater understanding of diabetes self-management. Patient education plan is to attend individual and/or group sessions per assessed needs and concerns.   Plan:   Patient Instructions  Plan:  Always carry your glucometer and short acting insulin with you Try real hard to eat three balanced meals per day Be cautious of the food  you bring into the house Ask your daughter to put their snacks in a special place  Aim for 2-3 Carb Choices per meal (30-45 grams) +/- 1 either way  Aim for 0-15 Carbs per snack if hungry  Include protein in moderation with your meals and snacks Consider reading food labels for Total Carbohydrate and Fat Grams of foods - next time Consider  increasing your activity level by walking for 30 minutes daily as tolerated Continue checking BG at alternate times per day as directed by MD  Continue taking medication as directed by MD  It is imperative that you begin some type of exercise program at least 2 times per week with a goal of 30 minutes 5 times per week. Work your way away from the regular soda, sweet tea( 1/2 & 1/2) and try to eliminate the OJ   Expected Outcomes:  Demonstrated interest in learning. Expect positive outcomes  Education material provided: Living Well with Diabetes, Meal plan card and My Plate  If problems or questions, patient to contact team via:  Phone  Future DSME appointment: 2 months

## 2015-10-28 NOTE — Patient Instructions (Signed)
Plan:  Always carry your glucometer and short acting insulin with you Try real hard to eat three balanced meals per day Be cautious of the food you bring into the house Ask your daughter to put their snacks in a special place  Aim for 2-3 Carb Choices per meal (30-45 grams) +/- 1 either way  Aim for 0-15 Carbs per snack if hungry  Include protein in moderation with your meals and snacks Consider reading food labels for Total Carbohydrate and Fat Grams of foods - next time Consider  increasing your activity level by walking for 30 minutes daily as tolerated Continue checking BG at alternate times per day as directed by MD  Continue taking medication as directed by MD  It is imperative that you begin some type of exercise program at least 2 times per week with a goal of 30 minutes 5 times per week. Work your way away from the regular soda, sweet tea( 1/2 & 1/2) and try to eliminate the OJ

## 2015-11-10 DIAGNOSIS — E669 Obesity, unspecified: Secondary | ICD-10-CM | POA: Diagnosis not present

## 2015-11-10 DIAGNOSIS — I120 Hypertensive chronic kidney disease with stage 5 chronic kidney disease or end stage renal disease: Secondary | ICD-10-CM | POA: Diagnosis not present

## 2015-11-10 DIAGNOSIS — D8989 Other specified disorders involving the immune mechanism, not elsewhere classified: Secondary | ICD-10-CM | POA: Diagnosis not present

## 2015-11-10 DIAGNOSIS — Z940 Kidney transplant status: Secondary | ICD-10-CM | POA: Diagnosis not present

## 2015-11-10 DIAGNOSIS — I10 Essential (primary) hypertension: Secondary | ICD-10-CM | POA: Diagnosis not present

## 2015-11-10 DIAGNOSIS — Z7982 Long term (current) use of aspirin: Secondary | ICD-10-CM | POA: Diagnosis not present

## 2015-11-10 DIAGNOSIS — E119 Type 2 diabetes mellitus without complications: Secondary | ICD-10-CM | POA: Diagnosis not present

## 2015-11-10 DIAGNOSIS — N2581 Secondary hyperparathyroidism of renal origin: Secondary | ICD-10-CM | POA: Diagnosis not present

## 2015-11-10 DIAGNOSIS — E1122 Type 2 diabetes mellitus with diabetic chronic kidney disease: Secondary | ICD-10-CM | POA: Diagnosis not present

## 2015-11-10 DIAGNOSIS — N186 End stage renal disease: Secondary | ICD-10-CM | POA: Diagnosis not present

## 2015-11-10 DIAGNOSIS — M7989 Other specified soft tissue disorders: Secondary | ICD-10-CM | POA: Diagnosis not present

## 2015-11-10 DIAGNOSIS — Z794 Long term (current) use of insulin: Secondary | ICD-10-CM | POA: Diagnosis not present

## 2015-11-10 DIAGNOSIS — Z4822 Encounter for aftercare following kidney transplant: Secondary | ICD-10-CM | POA: Diagnosis not present

## 2015-11-10 DIAGNOSIS — I871 Compression of vein: Secondary | ICD-10-CM | POA: Diagnosis not present

## 2015-11-10 DIAGNOSIS — Z7952 Long term (current) use of systemic steroids: Secondary | ICD-10-CM | POA: Diagnosis not present

## 2015-11-10 DIAGNOSIS — Z792 Long term (current) use of antibiotics: Secondary | ICD-10-CM | POA: Diagnosis not present

## 2015-11-10 DIAGNOSIS — Z79899 Other long term (current) drug therapy: Secondary | ICD-10-CM | POA: Diagnosis not present

## 2015-11-11 DIAGNOSIS — I10 Essential (primary) hypertension: Secondary | ICD-10-CM | POA: Diagnosis not present

## 2015-11-11 DIAGNOSIS — E039 Hypothyroidism, unspecified: Secondary | ICD-10-CM | POA: Diagnosis not present

## 2015-11-11 DIAGNOSIS — E7800 Pure hypercholesterolemia, unspecified: Secondary | ICD-10-CM | POA: Diagnosis not present

## 2015-11-11 DIAGNOSIS — E1165 Type 2 diabetes mellitus with hyperglycemia: Secondary | ICD-10-CM | POA: Diagnosis not present

## 2015-11-26 DIAGNOSIS — Z885 Allergy status to narcotic agent status: Secondary | ICD-10-CM | POA: Diagnosis not present

## 2015-11-26 DIAGNOSIS — E669 Obesity, unspecified: Secondary | ICD-10-CM | POA: Diagnosis not present

## 2015-11-26 DIAGNOSIS — E1122 Type 2 diabetes mellitus with diabetic chronic kidney disease: Secondary | ICD-10-CM | POA: Diagnosis not present

## 2015-11-26 DIAGNOSIS — Z79899 Other long term (current) drug therapy: Secondary | ICD-10-CM | POA: Diagnosis not present

## 2015-11-26 DIAGNOSIS — I120 Hypertensive chronic kidney disease with stage 5 chronic kidney disease or end stage renal disease: Secondary | ICD-10-CM | POA: Diagnosis not present

## 2015-11-26 DIAGNOSIS — Z87891 Personal history of nicotine dependence: Secondary | ICD-10-CM | POA: Diagnosis not present

## 2015-11-26 DIAGNOSIS — E119 Type 2 diabetes mellitus without complications: Secondary | ICD-10-CM | POA: Diagnosis not present

## 2015-11-26 DIAGNOSIS — Z4822 Encounter for aftercare following kidney transplant: Secondary | ICD-10-CM | POA: Diagnosis not present

## 2015-11-26 DIAGNOSIS — E7800 Pure hypercholesterolemia, unspecified: Secondary | ICD-10-CM | POA: Diagnosis not present

## 2015-11-26 DIAGNOSIS — Z6839 Body mass index (BMI) 39.0-39.9, adult: Secondary | ICD-10-CM | POA: Diagnosis not present

## 2015-11-26 DIAGNOSIS — K279 Peptic ulcer, site unspecified, unspecified as acute or chronic, without hemorrhage or perforation: Secondary | ICD-10-CM | POA: Diagnosis not present

## 2015-11-26 DIAGNOSIS — Z794 Long term (current) use of insulin: Secondary | ICD-10-CM | POA: Diagnosis not present

## 2015-11-26 DIAGNOSIS — Z7982 Long term (current) use of aspirin: Secondary | ICD-10-CM | POA: Diagnosis not present

## 2015-11-26 DIAGNOSIS — Z886 Allergy status to analgesic agent status: Secondary | ICD-10-CM | POA: Diagnosis not present

## 2015-11-26 DIAGNOSIS — E1129 Type 2 diabetes mellitus with other diabetic kidney complication: Secondary | ICD-10-CM | POA: Diagnosis not present

## 2015-11-26 DIAGNOSIS — T82858A Stenosis of vascular prosthetic devices, implants and grafts, initial encounter: Secondary | ICD-10-CM | POA: Diagnosis not present

## 2015-11-26 DIAGNOSIS — I10 Essential (primary) hypertension: Secondary | ICD-10-CM | POA: Diagnosis not present

## 2015-11-26 DIAGNOSIS — L988 Other specified disorders of the skin and subcutaneous tissue: Secondary | ICD-10-CM | POA: Diagnosis not present

## 2015-11-26 DIAGNOSIS — D899 Disorder involving the immune mechanism, unspecified: Secondary | ICD-10-CM | POA: Diagnosis not present

## 2015-11-26 DIAGNOSIS — N186 End stage renal disease: Secondary | ICD-10-CM | POA: Diagnosis not present

## 2015-11-26 DIAGNOSIS — I871 Compression of vein: Secondary | ICD-10-CM | POA: Diagnosis not present

## 2015-11-26 DIAGNOSIS — Z940 Kidney transplant status: Secondary | ICD-10-CM | POA: Diagnosis not present

## 2015-11-26 DIAGNOSIS — E872 Acidosis: Secondary | ICD-10-CM | POA: Diagnosis not present

## 2015-11-27 DIAGNOSIS — E119 Type 2 diabetes mellitus without complications: Secondary | ICD-10-CM | POA: Diagnosis not present

## 2015-11-27 DIAGNOSIS — Z794 Long term (current) use of insulin: Secondary | ICD-10-CM | POA: Diagnosis not present

## 2015-11-27 DIAGNOSIS — I871 Compression of vein: Secondary | ICD-10-CM | POA: Diagnosis not present

## 2015-11-27 DIAGNOSIS — Z4822 Encounter for aftercare following kidney transplant: Secondary | ICD-10-CM | POA: Diagnosis not present

## 2015-11-27 DIAGNOSIS — I10 Essential (primary) hypertension: Secondary | ICD-10-CM | POA: Diagnosis not present

## 2015-11-27 DIAGNOSIS — E1129 Type 2 diabetes mellitus with other diabetic kidney complication: Secondary | ICD-10-CM | POA: Diagnosis not present

## 2015-11-27 DIAGNOSIS — T82858A Stenosis of vascular prosthetic devices, implants and grafts, initial encounter: Secondary | ICD-10-CM | POA: Diagnosis not present

## 2015-11-27 DIAGNOSIS — Z940 Kidney transplant status: Secondary | ICD-10-CM | POA: Diagnosis not present

## 2015-11-27 DIAGNOSIS — Z79899 Other long term (current) drug therapy: Secondary | ICD-10-CM | POA: Diagnosis not present

## 2015-11-27 DIAGNOSIS — D899 Disorder involving the immune mechanism, unspecified: Secondary | ICD-10-CM | POA: Diagnosis not present

## 2015-12-09 ENCOUNTER — Encounter: Payer: Self-pay | Admitting: Skilled Nursing Facility1

## 2015-12-09 ENCOUNTER — Encounter: Payer: Medicare Other | Attending: Internal Medicine | Admitting: Skilled Nursing Facility1

## 2015-12-09 VITALS — Ht 62.0 in | Wt 213.0 lb

## 2015-12-09 DIAGNOSIS — E1165 Type 2 diabetes mellitus with hyperglycemia: Secondary | ICD-10-CM | POA: Diagnosis not present

## 2015-12-09 DIAGNOSIS — E118 Type 2 diabetes mellitus with unspecified complications: Secondary | ICD-10-CM | POA: Diagnosis not present

## 2015-12-09 DIAGNOSIS — E119 Type 2 diabetes mellitus without complications: Secondary | ICD-10-CM

## 2015-12-09 DIAGNOSIS — Z794 Long term (current) use of insulin: Secondary | ICD-10-CM | POA: Diagnosis not present

## 2015-12-09 NOTE — Progress Notes (Signed)
Pt states she Wants to exercise more and take more time for herself. Pt states she is doing better. Pt states she is going to do better for herself. Pt states she takes care of her grandchildren.   Diabetes Self-Management Education  Visit Type:     Appt. Start Time: 11:00 Appt. End Time: 11:30  12/09/2015  [name], identified by name and date of birth, is a 60 y.o. female with a diagnosis of Diabetes: Type 2.   ASSESSMENT  Height 5\' 2"  (1.575 m), weight 213 lb (96.616 kg). Body mass index is 38.95 kg/(m^2).       Diabetes Self-Management Education - 12/09/15 1107    Initial Visit   What type of meal plan do you follow? Carb controlled   Health Coping   How would you rate your overall health? Good   Psychosocial Assessment   Patient Belief/Attitude about Diabetes Motivated to manage diabetes   Self-care barriers None   Complications   Last HgB A1C per patient/outside source 8.3 %   Dietary Intake   Breakfast oatmeal   Lunch tuna and crackers---half ham sandwich   Snack (afternoon) carrots and ranch   Statistician, vegetables----BBQ ribs, corn, greens   Snack (evening) pie-----popcorn   Beverage(s) coffee, juice   Exercise   Exercise Type ADL's   Patient Education   Previous Diabetes Education Yes (please comment)   Patient Self-Evaluation of Goals - Patient rates self as meeting previously set goals (% of time)   Nutrition 25 - 50%   Physical Activity < 25%   Medications 50 - 75 %   Monitoring >75%   Reducing Risk 25 - 50%   Health Coping 25 - 50%   Subsequent Visit   Since your last visit have you continued or begun to take your medications as prescribed? Yes   Since your last visit have you had your blood pressure checked? Yes   Is your most recent blood pressure lower, unchanged, or higher since your last visit? Lower   Since your last visit have you experienced any weight changes? No change   Since your last visit, are you checking your blood glucose  at least once a day? Yes      Learning Objective:  Patient will have a greater understanding of diabetes self-management. Patient education plan is to attend individual and/or group sessions per assessed needs and concerns.   Plan:   Patient Instructions  -Try to walk for 10-15 minutes 3 days a week -Try to drink water more often than other sugary options -Take your long acting insulin at the same time every day -Continue on with 2-3 carb choices per meal and 1 per snack: Keeping the balance     Expected Outcomes:     Education material provided: Food label handouts  If problems or questions, patient to contact team via:  Phone  Future DSME appointment: -

## 2015-12-09 NOTE — Patient Instructions (Addendum)
-  Try to walk for 10-15 minutes 3 days a week -Try to drink water more often than other sugary options -Take your long acting insulin at the same time every day -Continue on with 2-3 carb choices per meal and 1 per snack: Keeping the balance

## 2015-12-10 DIAGNOSIS — N2581 Secondary hyperparathyroidism of renal origin: Secondary | ICD-10-CM | POA: Diagnosis not present

## 2015-12-10 DIAGNOSIS — I10 Essential (primary) hypertension: Secondary | ICD-10-CM | POA: Diagnosis not present

## 2015-12-10 DIAGNOSIS — M7989 Other specified soft tissue disorders: Secondary | ICD-10-CM | POA: Diagnosis not present

## 2015-12-10 DIAGNOSIS — D899 Disorder involving the immune mechanism, unspecified: Secondary | ICD-10-CM | POA: Diagnosis not present

## 2015-12-10 DIAGNOSIS — Z794 Long term (current) use of insulin: Secondary | ICD-10-CM | POA: Diagnosis not present

## 2015-12-10 DIAGNOSIS — Z7982 Long term (current) use of aspirin: Secondary | ICD-10-CM | POA: Diagnosis not present

## 2015-12-10 DIAGNOSIS — I120 Hypertensive chronic kidney disease with stage 5 chronic kidney disease or end stage renal disease: Secondary | ICD-10-CM | POA: Diagnosis not present

## 2015-12-10 DIAGNOSIS — R197 Diarrhea, unspecified: Secondary | ICD-10-CM | POA: Diagnosis not present

## 2015-12-10 DIAGNOSIS — Z79899 Other long term (current) drug therapy: Secondary | ICD-10-CM | POA: Diagnosis not present

## 2015-12-10 DIAGNOSIS — N186 End stage renal disease: Secondary | ICD-10-CM | POA: Diagnosis not present

## 2015-12-10 DIAGNOSIS — Z4822 Encounter for aftercare following kidney transplant: Secondary | ICD-10-CM | POA: Diagnosis not present

## 2015-12-10 DIAGNOSIS — E119 Type 2 diabetes mellitus without complications: Secondary | ICD-10-CM | POA: Diagnosis not present

## 2015-12-10 DIAGNOSIS — Z940 Kidney transplant status: Secondary | ICD-10-CM | POA: Diagnosis not present

## 2015-12-10 DIAGNOSIS — E1122 Type 2 diabetes mellitus with diabetic chronic kidney disease: Secondary | ICD-10-CM | POA: Diagnosis not present

## 2015-12-10 DIAGNOSIS — E669 Obesity, unspecified: Secondary | ICD-10-CM | POA: Diagnosis not present

## 2015-12-10 DIAGNOSIS — Z885 Allergy status to narcotic agent status: Secondary | ICD-10-CM | POA: Diagnosis not present

## 2016-01-06 DIAGNOSIS — R197 Diarrhea, unspecified: Secondary | ICD-10-CM | POA: Diagnosis not present

## 2016-01-06 DIAGNOSIS — Z7952 Long term (current) use of systemic steroids: Secondary | ICD-10-CM | POA: Diagnosis not present

## 2016-01-06 DIAGNOSIS — M7989 Other specified soft tissue disorders: Secondary | ICD-10-CM | POA: Diagnosis not present

## 2016-01-06 DIAGNOSIS — Z940 Kidney transplant status: Secondary | ICD-10-CM | POA: Diagnosis not present

## 2016-01-06 DIAGNOSIS — Z95828 Presence of other vascular implants and grafts: Secondary | ICD-10-CM | POA: Diagnosis not present

## 2016-01-06 DIAGNOSIS — Z4822 Encounter for aftercare following kidney transplant: Secondary | ICD-10-CM | POA: Diagnosis not present

## 2016-01-06 DIAGNOSIS — Z885 Allergy status to narcotic agent status: Secondary | ICD-10-CM | POA: Diagnosis not present

## 2016-01-06 DIAGNOSIS — D8989 Other specified disorders involving the immune mechanism, not elsewhere classified: Secondary | ICD-10-CM | POA: Diagnosis not present

## 2016-01-06 DIAGNOSIS — Z7982 Long term (current) use of aspirin: Secondary | ICD-10-CM | POA: Diagnosis not present

## 2016-01-06 DIAGNOSIS — E1122 Type 2 diabetes mellitus with diabetic chronic kidney disease: Secondary | ICD-10-CM | POA: Diagnosis not present

## 2016-01-06 DIAGNOSIS — I120 Hypertensive chronic kidney disease with stage 5 chronic kidney disease or end stage renal disease: Secondary | ICD-10-CM | POA: Diagnosis not present

## 2016-01-06 DIAGNOSIS — Z79899 Other long term (current) drug therapy: Secondary | ICD-10-CM | POA: Diagnosis not present

## 2016-01-06 DIAGNOSIS — N186 End stage renal disease: Secondary | ICD-10-CM | POA: Diagnosis not present

## 2016-01-06 DIAGNOSIS — Z794 Long term (current) use of insulin: Secondary | ICD-10-CM | POA: Diagnosis not present

## 2016-01-06 DIAGNOSIS — Z888 Allergy status to other drugs, medicaments and biological substances status: Secondary | ICD-10-CM | POA: Diagnosis not present

## 2016-01-19 DIAGNOSIS — Z940 Kidney transplant status: Secondary | ICD-10-CM | POA: Diagnosis not present

## 2016-01-28 DIAGNOSIS — H2513 Age-related nuclear cataract, bilateral: Secondary | ICD-10-CM | POA: Diagnosis not present

## 2016-01-28 DIAGNOSIS — H25011 Cortical age-related cataract, right eye: Secondary | ICD-10-CM | POA: Diagnosis not present

## 2016-01-28 DIAGNOSIS — E119 Type 2 diabetes mellitus without complications: Secondary | ICD-10-CM | POA: Diagnosis not present

## 2016-01-28 DIAGNOSIS — H43391 Other vitreous opacities, right eye: Secondary | ICD-10-CM | POA: Diagnosis not present

## 2016-02-02 DIAGNOSIS — D8989 Other specified disorders involving the immune mechanism, not elsewhere classified: Secondary | ICD-10-CM | POA: Diagnosis not present

## 2016-02-02 DIAGNOSIS — Z792 Long term (current) use of antibiotics: Secondary | ICD-10-CM | POA: Diagnosis not present

## 2016-02-02 DIAGNOSIS — E1122 Type 2 diabetes mellitus with diabetic chronic kidney disease: Secondary | ICD-10-CM | POA: Diagnosis not present

## 2016-02-02 DIAGNOSIS — Z794 Long term (current) use of insulin: Secondary | ICD-10-CM | POA: Diagnosis not present

## 2016-02-02 DIAGNOSIS — Z7952 Long term (current) use of systemic steroids: Secondary | ICD-10-CM | POA: Diagnosis not present

## 2016-02-02 DIAGNOSIS — I120 Hypertensive chronic kidney disease with stage 5 chronic kidney disease or end stage renal disease: Secondary | ICD-10-CM | POA: Diagnosis not present

## 2016-02-02 DIAGNOSIS — M7989 Other specified soft tissue disorders: Secondary | ICD-10-CM | POA: Diagnosis not present

## 2016-02-02 DIAGNOSIS — Z7982 Long term (current) use of aspirin: Secondary | ICD-10-CM | POA: Diagnosis not present

## 2016-02-02 DIAGNOSIS — N186 End stage renal disease: Secondary | ICD-10-CM | POA: Diagnosis not present

## 2016-02-02 DIAGNOSIS — E119 Type 2 diabetes mellitus without complications: Secondary | ICD-10-CM | POA: Diagnosis not present

## 2016-02-02 DIAGNOSIS — I10 Essential (primary) hypertension: Secondary | ICD-10-CM | POA: Diagnosis not present

## 2016-02-02 DIAGNOSIS — Z940 Kidney transplant status: Secondary | ICD-10-CM | POA: Diagnosis not present

## 2016-02-02 DIAGNOSIS — R635 Abnormal weight gain: Secondary | ICD-10-CM | POA: Diagnosis not present

## 2016-02-02 DIAGNOSIS — K219 Gastro-esophageal reflux disease without esophagitis: Secondary | ICD-10-CM | POA: Diagnosis not present

## 2016-02-02 DIAGNOSIS — R197 Diarrhea, unspecified: Secondary | ICD-10-CM | POA: Diagnosis not present

## 2016-02-02 DIAGNOSIS — N2581 Secondary hyperparathyroidism of renal origin: Secondary | ICD-10-CM | POA: Diagnosis not present

## 2016-02-02 DIAGNOSIS — Z4822 Encounter for aftercare following kidney transplant: Secondary | ICD-10-CM | POA: Diagnosis not present

## 2016-02-02 DIAGNOSIS — Z79899 Other long term (current) drug therapy: Secondary | ICD-10-CM | POA: Diagnosis not present

## 2016-02-02 DIAGNOSIS — Z6841 Body Mass Index (BMI) 40.0 and over, adult: Secondary | ICD-10-CM | POA: Diagnosis not present

## 2016-02-11 DIAGNOSIS — I10 Essential (primary) hypertension: Secondary | ICD-10-CM | POA: Diagnosis not present

## 2016-02-11 DIAGNOSIS — E7800 Pure hypercholesterolemia, unspecified: Secondary | ICD-10-CM | POA: Diagnosis not present

## 2016-02-11 DIAGNOSIS — E1165 Type 2 diabetes mellitus with hyperglycemia: Secondary | ICD-10-CM | POA: Diagnosis not present

## 2016-02-11 DIAGNOSIS — E039 Hypothyroidism, unspecified: Secondary | ICD-10-CM | POA: Diagnosis not present

## 2016-02-11 DIAGNOSIS — Z940 Kidney transplant status: Secondary | ICD-10-CM | POA: Diagnosis not present

## 2016-02-18 DIAGNOSIS — E1165 Type 2 diabetes mellitus with hyperglycemia: Secondary | ICD-10-CM | POA: Diagnosis not present

## 2016-02-19 DIAGNOSIS — E1129 Type 2 diabetes mellitus with other diabetic kidney complication: Secondary | ICD-10-CM | POA: Diagnosis not present

## 2016-02-19 DIAGNOSIS — E663 Overweight: Secondary | ICD-10-CM | POA: Diagnosis not present

## 2016-02-19 DIAGNOSIS — Z940 Kidney transplant status: Secondary | ICD-10-CM | POA: Diagnosis not present

## 2016-02-19 DIAGNOSIS — D899 Disorder involving the immune mechanism, unspecified: Secondary | ICD-10-CM | POA: Diagnosis not present

## 2016-02-19 DIAGNOSIS — N183 Chronic kidney disease, stage 3 (moderate): Secondary | ICD-10-CM | POA: Diagnosis not present

## 2016-02-19 DIAGNOSIS — T829XXA Unspecified complication of cardiac and vascular prosthetic device, implant and graft, initial encounter: Secondary | ICD-10-CM | POA: Diagnosis not present

## 2016-02-19 DIAGNOSIS — I129 Hypertensive chronic kidney disease with stage 1 through stage 4 chronic kidney disease, or unspecified chronic kidney disease: Secondary | ICD-10-CM | POA: Diagnosis not present

## 2016-03-19 DIAGNOSIS — E785 Hyperlipidemia, unspecified: Secondary | ICD-10-CM | POA: Diagnosis not present

## 2016-03-19 DIAGNOSIS — Z940 Kidney transplant status: Secondary | ICD-10-CM | POA: Diagnosis not present

## 2016-03-19 DIAGNOSIS — D638 Anemia in other chronic diseases classified elsewhere: Secondary | ICD-10-CM | POA: Diagnosis not present

## 2016-03-19 DIAGNOSIS — E1129 Type 2 diabetes mellitus with other diabetic kidney complication: Secondary | ICD-10-CM | POA: Diagnosis not present

## 2016-03-19 DIAGNOSIS — N2581 Secondary hyperparathyroidism of renal origin: Secondary | ICD-10-CM | POA: Diagnosis not present

## 2016-03-31 DIAGNOSIS — I120 Hypertensive chronic kidney disease with stage 5 chronic kidney disease or end stage renal disease: Secondary | ICD-10-CM | POA: Diagnosis not present

## 2016-03-31 DIAGNOSIS — Z885 Allergy status to narcotic agent status: Secondary | ICD-10-CM | POA: Diagnosis not present

## 2016-03-31 DIAGNOSIS — M7989 Other specified soft tissue disorders: Secondary | ICD-10-CM | POA: Diagnosis not present

## 2016-03-31 DIAGNOSIS — Z7982 Long term (current) use of aspirin: Secondary | ICD-10-CM | POA: Diagnosis not present

## 2016-03-31 DIAGNOSIS — N2581 Secondary hyperparathyroidism of renal origin: Secondary | ICD-10-CM | POA: Diagnosis not present

## 2016-03-31 DIAGNOSIS — Z4822 Encounter for aftercare following kidney transplant: Secondary | ICD-10-CM | POA: Diagnosis not present

## 2016-03-31 DIAGNOSIS — Z794 Long term (current) use of insulin: Secondary | ICD-10-CM | POA: Diagnosis not present

## 2016-03-31 DIAGNOSIS — R197 Diarrhea, unspecified: Secondary | ICD-10-CM | POA: Diagnosis not present

## 2016-03-31 DIAGNOSIS — I10 Essential (primary) hypertension: Secondary | ICD-10-CM | POA: Diagnosis not present

## 2016-03-31 DIAGNOSIS — Z940 Kidney transplant status: Secondary | ICD-10-CM | POA: Diagnosis not present

## 2016-03-31 DIAGNOSIS — E1129 Type 2 diabetes mellitus with other diabetic kidney complication: Secondary | ICD-10-CM | POA: Diagnosis not present

## 2016-03-31 DIAGNOSIS — E1122 Type 2 diabetes mellitus with diabetic chronic kidney disease: Secondary | ICD-10-CM | POA: Diagnosis not present

## 2016-03-31 DIAGNOSIS — D899 Disorder involving the immune mechanism, unspecified: Secondary | ICD-10-CM | POA: Diagnosis not present

## 2016-03-31 DIAGNOSIS — Z7952 Long term (current) use of systemic steroids: Secondary | ICD-10-CM | POA: Diagnosis not present

## 2016-03-31 DIAGNOSIS — Z886 Allergy status to analgesic agent status: Secondary | ICD-10-CM | POA: Diagnosis not present

## 2016-03-31 DIAGNOSIS — Z79899 Other long term (current) drug therapy: Secondary | ICD-10-CM | POA: Diagnosis not present

## 2016-03-31 DIAGNOSIS — K219 Gastro-esophageal reflux disease without esophagitis: Secondary | ICD-10-CM | POA: Diagnosis not present

## 2016-03-31 DIAGNOSIS — D8989 Other specified disorders involving the immune mechanism, not elsewhere classified: Secondary | ICD-10-CM | POA: Diagnosis not present

## 2016-03-31 DIAGNOSIS — N186 End stage renal disease: Secondary | ICD-10-CM | POA: Diagnosis not present

## 2016-05-13 DIAGNOSIS — I10 Essential (primary) hypertension: Secondary | ICD-10-CM | POA: Diagnosis not present

## 2016-05-13 DIAGNOSIS — E1165 Type 2 diabetes mellitus with hyperglycemia: Secondary | ICD-10-CM | POA: Diagnosis not present

## 2016-05-13 DIAGNOSIS — E7800 Pure hypercholesterolemia, unspecified: Secondary | ICD-10-CM | POA: Diagnosis not present

## 2016-05-13 DIAGNOSIS — Z23 Encounter for immunization: Secondary | ICD-10-CM | POA: Diagnosis not present

## 2016-05-13 DIAGNOSIS — E039 Hypothyroidism, unspecified: Secondary | ICD-10-CM | POA: Diagnosis not present

## 2016-05-19 DIAGNOSIS — D638 Anemia in other chronic diseases classified elsewhere: Secondary | ICD-10-CM | POA: Diagnosis not present

## 2016-05-19 DIAGNOSIS — Z940 Kidney transplant status: Secondary | ICD-10-CM | POA: Diagnosis not present

## 2016-05-19 DIAGNOSIS — E785 Hyperlipidemia, unspecified: Secondary | ICD-10-CM | POA: Diagnosis not present

## 2016-05-19 DIAGNOSIS — E1129 Type 2 diabetes mellitus with other diabetic kidney complication: Secondary | ICD-10-CM | POA: Diagnosis not present

## 2016-05-20 DIAGNOSIS — E1165 Type 2 diabetes mellitus with hyperglycemia: Secondary | ICD-10-CM | POA: Diagnosis not present

## 2016-05-26 DIAGNOSIS — D899 Disorder involving the immune mechanism, unspecified: Secondary | ICD-10-CM | POA: Diagnosis not present

## 2016-05-26 DIAGNOSIS — Z940 Kidney transplant status: Secondary | ICD-10-CM | POA: Diagnosis not present

## 2016-05-26 DIAGNOSIS — E663 Overweight: Secondary | ICD-10-CM | POA: Diagnosis not present

## 2016-05-26 DIAGNOSIS — I129 Hypertensive chronic kidney disease with stage 1 through stage 4 chronic kidney disease, or unspecified chronic kidney disease: Secondary | ICD-10-CM | POA: Diagnosis not present

## 2016-05-26 DIAGNOSIS — T829XXA Unspecified complication of cardiac and vascular prosthetic device, implant and graft, initial encounter: Secondary | ICD-10-CM | POA: Diagnosis not present

## 2016-05-26 DIAGNOSIS — N183 Chronic kidney disease, stage 3 (moderate): Secondary | ICD-10-CM | POA: Diagnosis not present

## 2016-05-26 DIAGNOSIS — E1129 Type 2 diabetes mellitus with other diabetic kidney complication: Secondary | ICD-10-CM | POA: Diagnosis not present

## 2016-05-27 ENCOUNTER — Encounter: Payer: Self-pay | Admitting: Internal Medicine

## 2016-05-27 LAB — HM DIABETES EYE EXAM

## 2016-06-17 DIAGNOSIS — I129 Hypertensive chronic kidney disease with stage 1 through stage 4 chronic kidney disease, or unspecified chronic kidney disease: Secondary | ICD-10-CM | POA: Diagnosis not present

## 2016-06-17 DIAGNOSIS — Z79899 Other long term (current) drug therapy: Secondary | ICD-10-CM | POA: Diagnosis not present

## 2016-06-17 DIAGNOSIS — N183 Chronic kidney disease, stage 3 (moderate): Secondary | ICD-10-CM | POA: Diagnosis not present

## 2016-06-17 DIAGNOSIS — E1165 Type 2 diabetes mellitus with hyperglycemia: Secondary | ICD-10-CM | POA: Diagnosis not present

## 2016-06-17 DIAGNOSIS — E039 Hypothyroidism, unspecified: Secondary | ICD-10-CM | POA: Diagnosis not present

## 2016-06-18 DIAGNOSIS — E1165 Type 2 diabetes mellitus with hyperglycemia: Secondary | ICD-10-CM | POA: Diagnosis not present

## 2016-06-18 DIAGNOSIS — I129 Hypertensive chronic kidney disease with stage 1 through stage 4 chronic kidney disease, or unspecified chronic kidney disease: Secondary | ICD-10-CM | POA: Diagnosis not present

## 2016-06-18 DIAGNOSIS — E039 Hypothyroidism, unspecified: Secondary | ICD-10-CM | POA: Diagnosis not present

## 2016-06-18 DIAGNOSIS — N183 Chronic kidney disease, stage 3 (moderate): Secondary | ICD-10-CM | POA: Diagnosis not present

## 2016-07-14 DIAGNOSIS — E785 Hyperlipidemia, unspecified: Secondary | ICD-10-CM | POA: Diagnosis not present

## 2016-07-14 DIAGNOSIS — D638 Anemia in other chronic diseases classified elsewhere: Secondary | ICD-10-CM | POA: Diagnosis not present

## 2016-07-14 DIAGNOSIS — Z940 Kidney transplant status: Secondary | ICD-10-CM | POA: Diagnosis not present

## 2016-07-14 DIAGNOSIS — E1129 Type 2 diabetes mellitus with other diabetic kidney complication: Secondary | ICD-10-CM | POA: Diagnosis not present

## 2016-08-18 DIAGNOSIS — Z940 Kidney transplant status: Secondary | ICD-10-CM | POA: Diagnosis not present

## 2016-09-13 DIAGNOSIS — E7800 Pure hypercholesterolemia, unspecified: Secondary | ICD-10-CM | POA: Diagnosis not present

## 2016-09-13 DIAGNOSIS — I10 Essential (primary) hypertension: Secondary | ICD-10-CM | POA: Diagnosis not present

## 2016-09-13 DIAGNOSIS — E039 Hypothyroidism, unspecified: Secondary | ICD-10-CM | POA: Diagnosis not present

## 2016-09-13 DIAGNOSIS — E1165 Type 2 diabetes mellitus with hyperglycemia: Secondary | ICD-10-CM | POA: Diagnosis not present

## 2016-09-22 DIAGNOSIS — D899 Disorder involving the immune mechanism, unspecified: Secondary | ICD-10-CM | POA: Diagnosis not present

## 2016-09-22 DIAGNOSIS — N183 Chronic kidney disease, stage 3 (moderate): Secondary | ICD-10-CM | POA: Diagnosis not present

## 2016-09-22 DIAGNOSIS — E663 Overweight: Secondary | ICD-10-CM | POA: Diagnosis not present

## 2016-09-22 DIAGNOSIS — N2581 Secondary hyperparathyroidism of renal origin: Secondary | ICD-10-CM | POA: Diagnosis not present

## 2016-09-22 DIAGNOSIS — Z940 Kidney transplant status: Secondary | ICD-10-CM | POA: Diagnosis not present

## 2016-09-22 DIAGNOSIS — E1129 Type 2 diabetes mellitus with other diabetic kidney complication: Secondary | ICD-10-CM | POA: Diagnosis not present

## 2016-09-22 DIAGNOSIS — T829XXA Unspecified complication of cardiac and vascular prosthetic device, implant and graft, initial encounter: Secondary | ICD-10-CM | POA: Diagnosis not present

## 2016-09-22 DIAGNOSIS — I129 Hypertensive chronic kidney disease with stage 1 through stage 4 chronic kidney disease, or unspecified chronic kidney disease: Secondary | ICD-10-CM | POA: Diagnosis not present

## 2016-09-23 DIAGNOSIS — N08 Glomerular disorders in diseases classified elsewhere: Secondary | ICD-10-CM | POA: Diagnosis not present

## 2016-09-23 DIAGNOSIS — E1122 Type 2 diabetes mellitus with diabetic chronic kidney disease: Secondary | ICD-10-CM | POA: Diagnosis not present

## 2016-09-23 DIAGNOSIS — B351 Tinea unguium: Secondary | ICD-10-CM | POA: Diagnosis not present

## 2016-09-23 DIAGNOSIS — I129 Hypertensive chronic kidney disease with stage 1 through stage 4 chronic kidney disease, or unspecified chronic kidney disease: Secondary | ICD-10-CM | POA: Diagnosis not present

## 2016-09-23 DIAGNOSIS — N183 Chronic kidney disease, stage 3 (moderate): Secondary | ICD-10-CM | POA: Diagnosis not present

## 2016-09-24 DIAGNOSIS — Z940 Kidney transplant status: Secondary | ICD-10-CM | POA: Diagnosis not present

## 2016-09-24 DIAGNOSIS — E1129 Type 2 diabetes mellitus with other diabetic kidney complication: Secondary | ICD-10-CM | POA: Diagnosis not present

## 2016-09-24 DIAGNOSIS — D638 Anemia in other chronic diseases classified elsewhere: Secondary | ICD-10-CM | POA: Diagnosis not present

## 2016-09-24 DIAGNOSIS — N2581 Secondary hyperparathyroidism of renal origin: Secondary | ICD-10-CM | POA: Diagnosis not present

## 2016-09-24 DIAGNOSIS — E785 Hyperlipidemia, unspecified: Secondary | ICD-10-CM | POA: Diagnosis not present

## 2016-09-30 ENCOUNTER — Ambulatory Visit: Payer: PRIVATE HEALTH INSURANCE | Admitting: Internal Medicine

## 2016-10-22 DIAGNOSIS — Z940 Kidney transplant status: Secondary | ICD-10-CM | POA: Diagnosis not present

## 2016-11-01 DIAGNOSIS — Z79899 Other long term (current) drug therapy: Secondary | ICD-10-CM | POA: Diagnosis not present

## 2016-12-02 ENCOUNTER — Other Ambulatory Visit: Payer: Self-pay | Admitting: Internal Medicine

## 2016-12-02 DIAGNOSIS — Z1231 Encounter for screening mammogram for malignant neoplasm of breast: Secondary | ICD-10-CM

## 2016-12-05 IMAGING — DX DG CHEST 2V
2 series · 2 of 2 positions shown · non-contrast
Comparison: Chest radiograph performed 05/22/2013

CLINICAL DATA: Persistent productive cough for 1 month. Initial
encounter.

EXAM:
CHEST  2 VIEW

[chest lat]
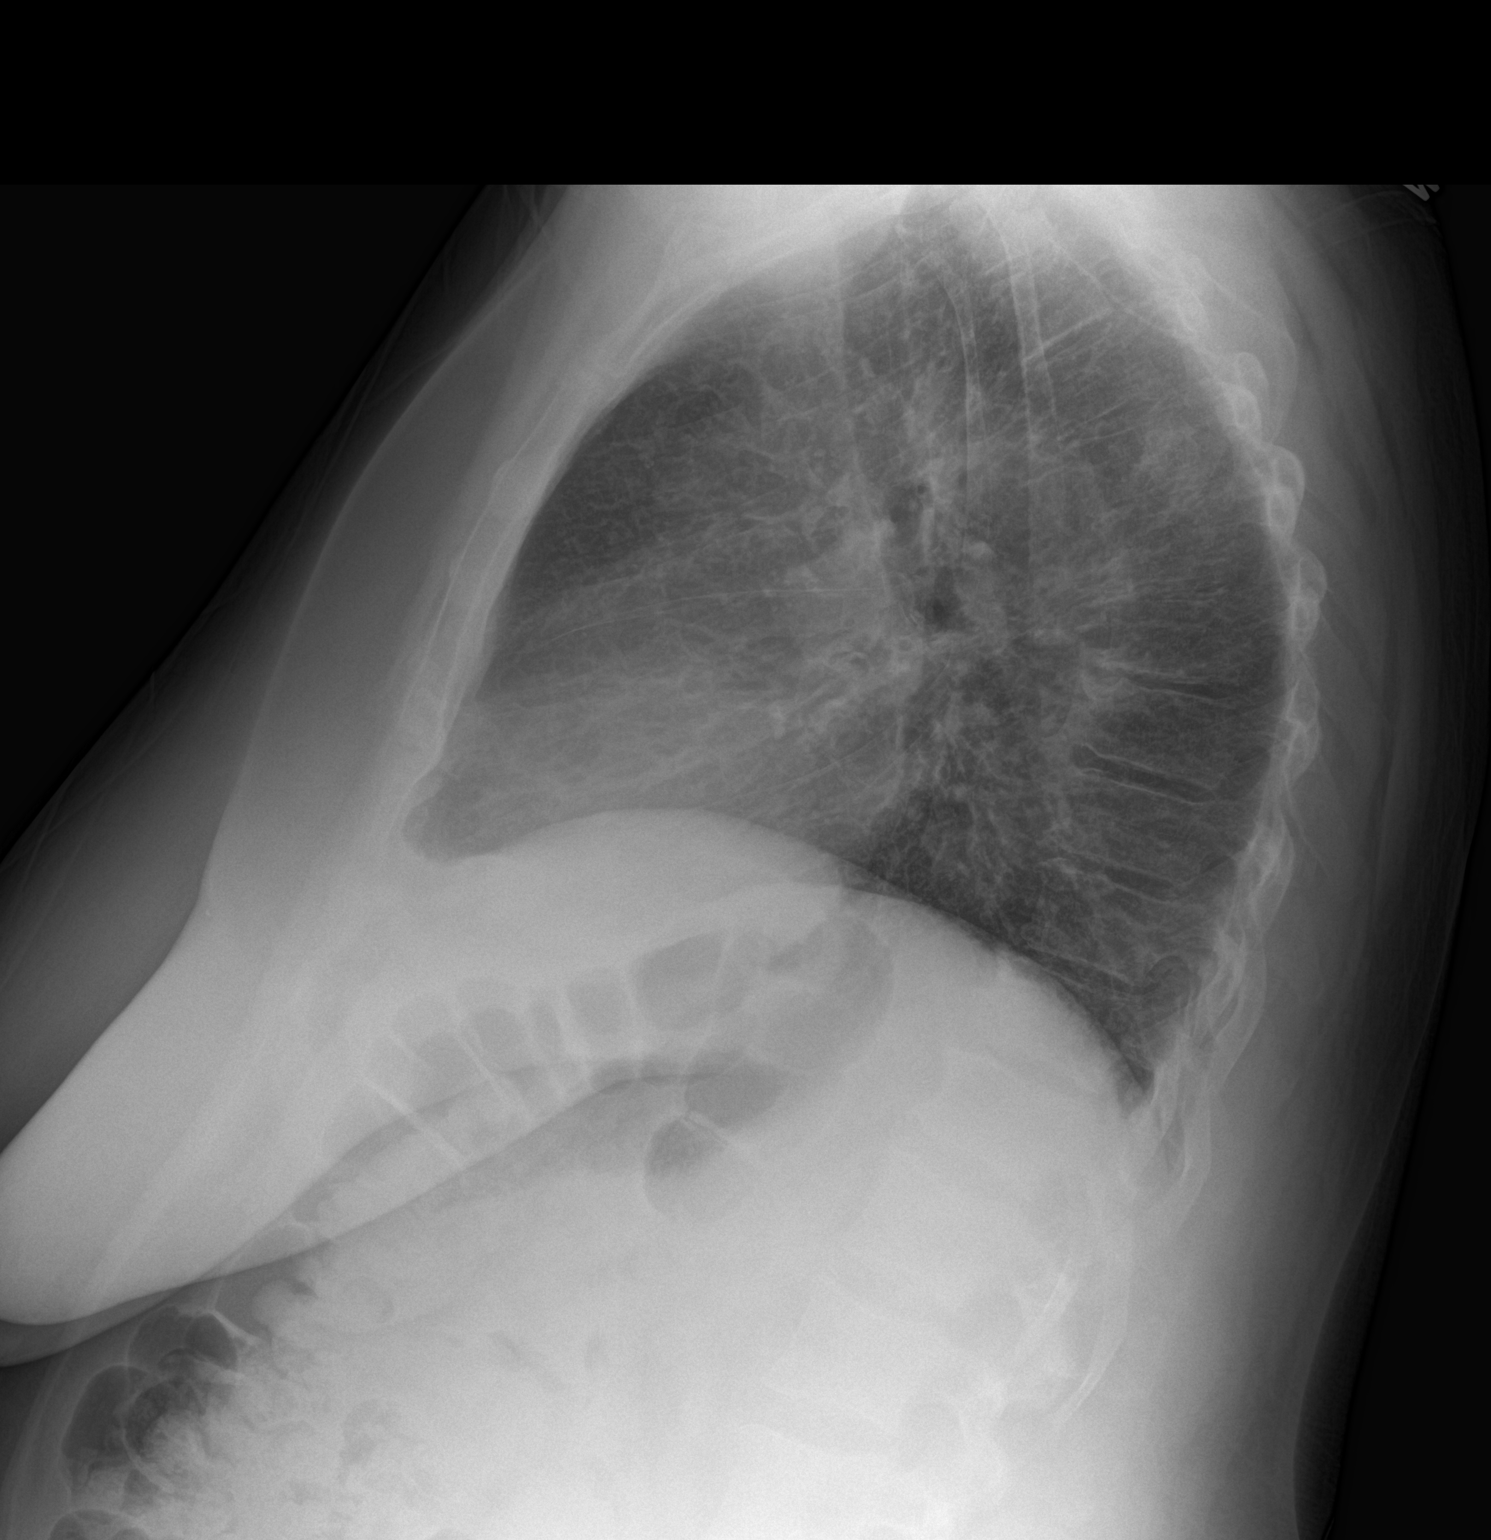

[chest pa]
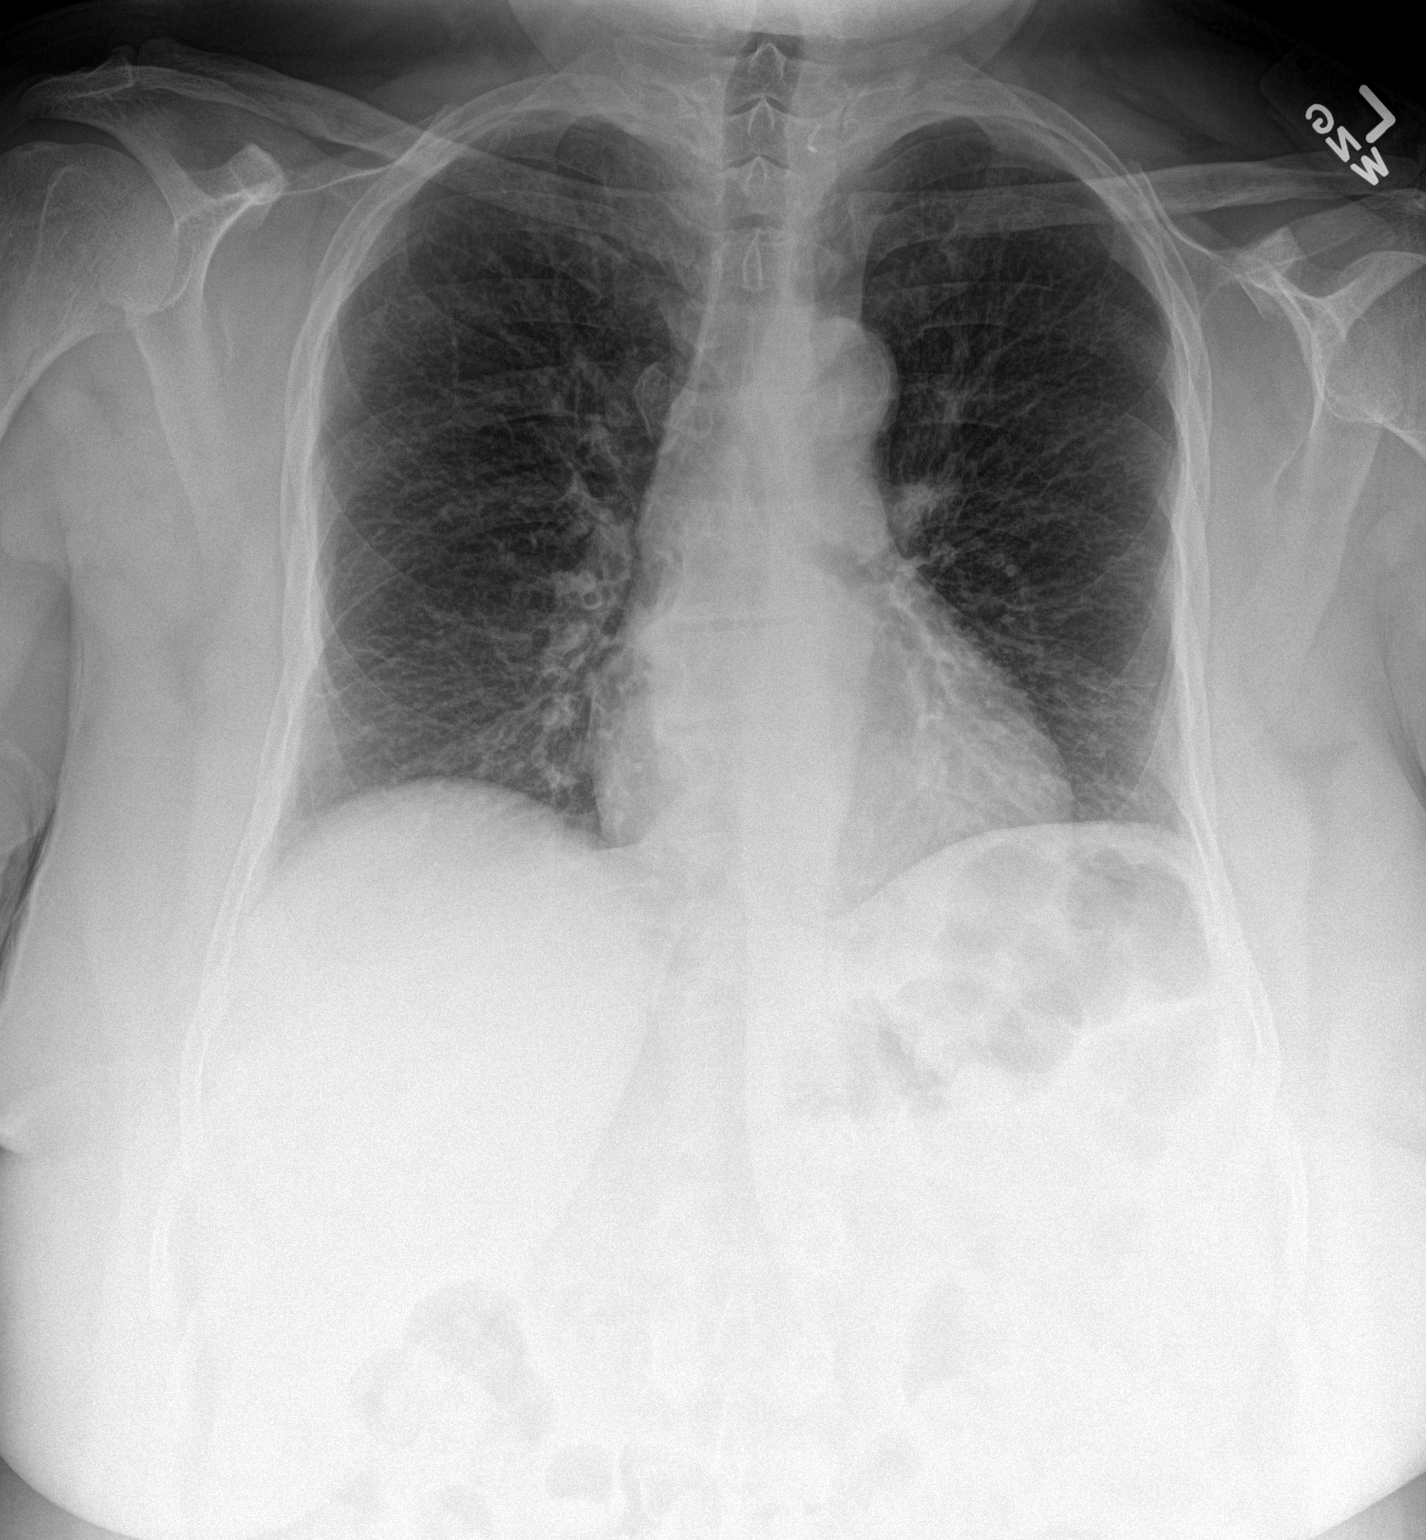

[2 of 2 positions shown; findings below may reference images not displayed]

FINDINGS: The lungs are well-aerated. Mild bibasilar atelectasis is noted.
Mild peribronchial thickening is seen. There is no evidence of focal
opacification, pleural effusion or pneumothorax.

The heart is normal in size; the mediastinal contour is within
normal limits. No acute osseous abnormalities are seen.
IMPRESSION: Mild bibasilar atelectasis noted; mild peribronchial thickening
seen.

## 2016-12-06 DIAGNOSIS — Z940 Kidney transplant status: Secondary | ICD-10-CM | POA: Diagnosis not present

## 2016-12-06 DIAGNOSIS — E1129 Type 2 diabetes mellitus with other diabetic kidney complication: Secondary | ICD-10-CM | POA: Diagnosis not present

## 2016-12-06 DIAGNOSIS — E785 Hyperlipidemia, unspecified: Secondary | ICD-10-CM | POA: Diagnosis not present

## 2016-12-06 DIAGNOSIS — D638 Anemia in other chronic diseases classified elsewhere: Secondary | ICD-10-CM | POA: Diagnosis not present

## 2016-12-14 DIAGNOSIS — D899 Disorder involving the immune mechanism, unspecified: Secondary | ICD-10-CM | POA: Diagnosis not present

## 2016-12-14 DIAGNOSIS — E669 Obesity, unspecified: Secondary | ICD-10-CM | POA: Diagnosis not present

## 2016-12-14 DIAGNOSIS — I129 Hypertensive chronic kidney disease with stage 1 through stage 4 chronic kidney disease, or unspecified chronic kidney disease: Secondary | ICD-10-CM | POA: Diagnosis not present

## 2016-12-14 DIAGNOSIS — E663 Overweight: Secondary | ICD-10-CM | POA: Diagnosis not present

## 2016-12-14 DIAGNOSIS — Z940 Kidney transplant status: Secondary | ICD-10-CM | POA: Diagnosis not present

## 2016-12-14 DIAGNOSIS — N2581 Secondary hyperparathyroidism of renal origin: Secondary | ICD-10-CM | POA: Diagnosis not present

## 2016-12-14 DIAGNOSIS — E1129 Type 2 diabetes mellitus with other diabetic kidney complication: Secondary | ICD-10-CM | POA: Diagnosis not present

## 2016-12-14 DIAGNOSIS — T829XXA Unspecified complication of cardiac and vascular prosthetic device, implant and graft, initial encounter: Secondary | ICD-10-CM | POA: Diagnosis not present

## 2016-12-14 DIAGNOSIS — N183 Chronic kidney disease, stage 3 (moderate): Secondary | ICD-10-CM | POA: Diagnosis not present

## 2016-12-15 LAB — CBC AND DIFFERENTIAL
HCT: 38 % (ref 36–46)
Hemoglobin: 12.5 g/dL (ref 12.0–16.0)
Neutrophils Absolute: 5 /uL
Platelets: 104 10*3/uL — AB (ref 150–399)
WBC: 6 10^3/mL

## 2016-12-15 LAB — BASIC METABOLIC PANEL
BUN: 23 mg/dL — AB (ref 4–21)
Creatinine: 1.4 mg/dL — AB (ref 0.5–1.1)
Glucose: 389 mg/dL
Potassium: 4.3 mmol/L (ref 3.4–5.3)
Sodium: 132 mmol/L — AB (ref 137–147)

## 2016-12-15 LAB — HEPATIC FUNCTION PANEL
ALK PHOS: 123 U/L (ref 25–125)
ALT: 13 U/L (ref 7–35)
AST: 10 U/L — AB (ref 13–35)

## 2016-12-17 ENCOUNTER — Encounter: Payer: Self-pay | Admitting: Internal Medicine

## 2016-12-17 NOTE — Progress Notes (Unsigned)
Results entered and sent to scan  

## 2016-12-21 DIAGNOSIS — N08 Glomerular disorders in diseases classified elsewhere: Secondary | ICD-10-CM | POA: Diagnosis not present

## 2016-12-21 DIAGNOSIS — E559 Vitamin D deficiency, unspecified: Secondary | ICD-10-CM | POA: Diagnosis not present

## 2016-12-21 DIAGNOSIS — Z0000 Encounter for general adult medical examination without abnormal findings: Secondary | ICD-10-CM | POA: Diagnosis not present

## 2016-12-21 DIAGNOSIS — N183 Chronic kidney disease, stage 3 (moderate): Secondary | ICD-10-CM | POA: Diagnosis not present

## 2016-12-21 DIAGNOSIS — E1122 Type 2 diabetes mellitus with diabetic chronic kidney disease: Secondary | ICD-10-CM | POA: Diagnosis not present

## 2016-12-21 DIAGNOSIS — I129 Hypertensive chronic kidney disease with stage 1 through stage 4 chronic kidney disease, or unspecified chronic kidney disease: Secondary | ICD-10-CM | POA: Diagnosis not present

## 2016-12-22 ENCOUNTER — Ambulatory Visit
Admission: RE | Admit: 2016-12-22 | Discharge: 2016-12-22 | Disposition: A | Discharge status: Home / Self Care | Payer: Medicare Other | Source: Ambulatory Visit | Attending: Internal Medicine | Admitting: Internal Medicine

## 2016-12-22 DIAGNOSIS — Z1231 Encounter for screening mammogram for malignant neoplasm of breast: Secondary | ICD-10-CM

## 2016-12-30 DIAGNOSIS — I129 Hypertensive chronic kidney disease with stage 1 through stage 4 chronic kidney disease, or unspecified chronic kidney disease: Secondary | ICD-10-CM | POA: Diagnosis not present

## 2017-01-04 DIAGNOSIS — E039 Hypothyroidism, unspecified: Secondary | ICD-10-CM | POA: Diagnosis not present

## 2017-01-04 DIAGNOSIS — E7800 Pure hypercholesterolemia, unspecified: Secondary | ICD-10-CM | POA: Diagnosis not present

## 2017-01-04 DIAGNOSIS — E1165 Type 2 diabetes mellitus with hyperglycemia: Secondary | ICD-10-CM | POA: Diagnosis not present

## 2017-01-11 DIAGNOSIS — E7800 Pure hypercholesterolemia, unspecified: Secondary | ICD-10-CM | POA: Diagnosis not present

## 2017-01-11 DIAGNOSIS — E1165 Type 2 diabetes mellitus with hyperglycemia: Secondary | ICD-10-CM | POA: Diagnosis not present

## 2017-01-11 DIAGNOSIS — E039 Hypothyroidism, unspecified: Secondary | ICD-10-CM | POA: Diagnosis not present

## 2017-01-11 DIAGNOSIS — I10 Essential (primary) hypertension: Secondary | ICD-10-CM | POA: Diagnosis not present

## 2017-02-18 DIAGNOSIS — Z940 Kidney transplant status: Secondary | ICD-10-CM | POA: Diagnosis not present

## 2017-02-18 DIAGNOSIS — D638 Anemia in other chronic diseases classified elsewhere: Secondary | ICD-10-CM | POA: Diagnosis not present

## 2017-02-18 DIAGNOSIS — E1129 Type 2 diabetes mellitus with other diabetic kidney complication: Secondary | ICD-10-CM | POA: Diagnosis not present

## 2017-02-18 DIAGNOSIS — E785 Hyperlipidemia, unspecified: Secondary | ICD-10-CM | POA: Diagnosis not present

## 2017-02-18 DIAGNOSIS — N2581 Secondary hyperparathyroidism of renal origin: Secondary | ICD-10-CM | POA: Diagnosis not present

## 2017-02-25 DIAGNOSIS — E119 Type 2 diabetes mellitus without complications: Secondary | ICD-10-CM | POA: Diagnosis not present

## 2017-02-25 DIAGNOSIS — H2513 Age-related nuclear cataract, bilateral: Secondary | ICD-10-CM | POA: Diagnosis not present

## 2017-02-25 DIAGNOSIS — H43391 Other vitreous opacities, right eye: Secondary | ICD-10-CM | POA: Diagnosis not present

## 2017-02-25 LAB — HM DIABETES EYE EXAM

## 2017-03-17 DIAGNOSIS — Z940 Kidney transplant status: Secondary | ICD-10-CM | POA: Diagnosis not present

## 2017-03-17 DIAGNOSIS — N183 Chronic kidney disease, stage 3 (moderate): Secondary | ICD-10-CM | POA: Diagnosis not present

## 2017-03-17 DIAGNOSIS — E669 Obesity, unspecified: Secondary | ICD-10-CM | POA: Diagnosis not present

## 2017-03-17 DIAGNOSIS — N2581 Secondary hyperparathyroidism of renal origin: Secondary | ICD-10-CM | POA: Diagnosis not present

## 2017-03-17 DIAGNOSIS — I129 Hypertensive chronic kidney disease with stage 1 through stage 4 chronic kidney disease, or unspecified chronic kidney disease: Secondary | ICD-10-CM | POA: Diagnosis not present

## 2017-03-17 DIAGNOSIS — E663 Overweight: Secondary | ICD-10-CM | POA: Diagnosis not present

## 2017-03-17 DIAGNOSIS — D899 Disorder involving the immune mechanism, unspecified: Secondary | ICD-10-CM | POA: Diagnosis not present

## 2017-03-17 DIAGNOSIS — T829XXA Unspecified complication of cardiac and vascular prosthetic device, implant and graft, initial encounter: Secondary | ICD-10-CM | POA: Diagnosis not present

## 2017-03-17 DIAGNOSIS — E1129 Type 2 diabetes mellitus with other diabetic kidney complication: Secondary | ICD-10-CM | POA: Diagnosis not present

## 2017-03-17 LAB — CBC AND DIFFERENTIAL
HEMATOCRIT: 38 (ref 36–46)
Hemoglobin: 12.8 (ref 12.0–16.0)
NEUTROS ABS: 4
PLATELETS: 118 — AB (ref 150–399)
WBC: 5.9

## 2017-03-17 LAB — LIPID PANEL
Cholesterol: 170 (ref 0–200)
HDL: 59 (ref 35–70)
LDL Cholesterol: 88
Triglycerides: 113 (ref 40–160)

## 2017-03-17 LAB — HEMOGLOBIN A1C: Hemoglobin A1C: 8.3

## 2017-03-17 LAB — HEPATIC FUNCTION PANEL
ALK PHOS: 90 (ref 25–125)
ALT: 16 (ref 7–35)
AST: 12 — AB (ref 13–35)
BILIRUBIN, TOTAL: 0.6

## 2017-03-17 LAB — BASIC METABOLIC PANEL
BUN: 22 — AB (ref 4–21)
CREATININE: 1.7 — AB (ref 0.5–1.1)
Glucose: 201
POTASSIUM: 4.6 (ref 3.4–5.3)
Sodium: 138 (ref 137–147)

## 2017-03-17 LAB — TSH: TSH: 1.57 (ref 0.41–5.90)

## 2017-03-18 ENCOUNTER — Encounter: Payer: Self-pay | Admitting: Internal Medicine

## 2017-03-18 LAB — URINALYSIS W MICROSCOPIC (NOT AT ARMC)
Bilirubin: NEGATIVE
GLUCOSE: NEGATIVE
Ketone: NEGATIVE
Leukocytes, UA: NEGATIVE — AB
Nitrites: NEGATIVE
Protein: NEGATIVE
Specific Gravity: 1.015
UROBILINOGEN UA: NORMAL
pH: 5.5

## 2017-03-18 NOTE — Progress Notes (Signed)
Abstracted and sent to scan  

## 2017-03-21 ENCOUNTER — Other Ambulatory Visit: Payer: Self-pay | Admitting: Nephrology

## 2017-03-21 DIAGNOSIS — N183 Chronic kidney disease, stage 3 unspecified: Secondary | ICD-10-CM

## 2017-03-24 ENCOUNTER — Ambulatory Visit
Admission: RE | Admit: 2017-03-24 | Discharge: 2017-03-24 | Disposition: A | Discharge status: Home / Self Care | Payer: Medicare Other | Source: Ambulatory Visit | Attending: Nephrology | Admitting: Nephrology

## 2017-03-24 DIAGNOSIS — N183 Chronic kidney disease, stage 3 unspecified: Secondary | ICD-10-CM

## 2017-03-24 DIAGNOSIS — Z940 Kidney transplant status: Secondary | ICD-10-CM | POA: Diagnosis not present

## 2017-03-24 DIAGNOSIS — D631 Anemia in chronic kidney disease: Secondary | ICD-10-CM | POA: Diagnosis not present

## 2017-03-30 DIAGNOSIS — I129 Hypertensive chronic kidney disease with stage 1 through stage 4 chronic kidney disease, or unspecified chronic kidney disease: Secondary | ICD-10-CM | POA: Diagnosis not present

## 2017-03-31 DIAGNOSIS — I10 Essential (primary) hypertension: Secondary | ICD-10-CM | POA: Diagnosis not present

## 2017-03-31 DIAGNOSIS — R635 Abnormal weight gain: Secondary | ICD-10-CM | POA: Diagnosis not present

## 2017-03-31 DIAGNOSIS — E213 Hyperparathyroidism, unspecified: Secondary | ICD-10-CM | POA: Diagnosis not present

## 2017-03-31 DIAGNOSIS — Z886 Allergy status to analgesic agent status: Secondary | ICD-10-CM | POA: Diagnosis not present

## 2017-03-31 DIAGNOSIS — Z4822 Encounter for aftercare following kidney transplant: Secondary | ICD-10-CM | POA: Diagnosis not present

## 2017-03-31 DIAGNOSIS — Z885 Allergy status to narcotic agent status: Secondary | ICD-10-CM | POA: Diagnosis not present

## 2017-03-31 DIAGNOSIS — Z7952 Long term (current) use of systemic steroids: Secondary | ICD-10-CM | POA: Diagnosis not present

## 2017-03-31 DIAGNOSIS — Z79899 Other long term (current) drug therapy: Secondary | ICD-10-CM | POA: Diagnosis not present

## 2017-03-31 DIAGNOSIS — N186 End stage renal disease: Secondary | ICD-10-CM | POA: Diagnosis not present

## 2017-03-31 DIAGNOSIS — Z940 Kidney transplant status: Secondary | ICD-10-CM | POA: Diagnosis not present

## 2017-03-31 DIAGNOSIS — Z7982 Long term (current) use of aspirin: Secondary | ICD-10-CM | POA: Diagnosis not present

## 2017-03-31 DIAGNOSIS — E1122 Type 2 diabetes mellitus with diabetic chronic kidney disease: Secondary | ICD-10-CM | POA: Diagnosis not present

## 2017-03-31 DIAGNOSIS — Z794 Long term (current) use of insulin: Secondary | ICD-10-CM | POA: Diagnosis not present

## 2017-03-31 DIAGNOSIS — D8989 Other specified disorders involving the immune mechanism, not elsewhere classified: Secondary | ICD-10-CM | POA: Diagnosis not present

## 2017-03-31 DIAGNOSIS — Z955 Presence of coronary angioplasty implant and graft: Secondary | ICD-10-CM | POA: Diagnosis not present

## 2017-03-31 DIAGNOSIS — N2581 Secondary hyperparathyroidism of renal origin: Secondary | ICD-10-CM | POA: Diagnosis not present

## 2017-03-31 DIAGNOSIS — K219 Gastro-esophageal reflux disease without esophagitis: Secondary | ICD-10-CM | POA: Diagnosis not present

## 2017-03-31 DIAGNOSIS — Z792 Long term (current) use of antibiotics: Secondary | ICD-10-CM | POA: Diagnosis not present

## 2017-03-31 DIAGNOSIS — M7989 Other specified soft tissue disorders: Secondary | ICD-10-CM | POA: Diagnosis not present

## 2017-03-31 DIAGNOSIS — Z95828 Presence of other vascular implants and grafts: Secondary | ICD-10-CM | POA: Diagnosis not present

## 2017-03-31 DIAGNOSIS — R197 Diarrhea, unspecified: Secondary | ICD-10-CM | POA: Diagnosis not present

## 2017-03-31 DIAGNOSIS — D899 Disorder involving the immune mechanism, unspecified: Secondary | ICD-10-CM | POA: Diagnosis not present

## 2017-03-31 DIAGNOSIS — E119 Type 2 diabetes mellitus without complications: Secondary | ICD-10-CM | POA: Diagnosis not present

## 2017-03-31 DIAGNOSIS — I120 Hypertensive chronic kidney disease with stage 5 chronic kidney disease or end stage renal disease: Secondary | ICD-10-CM | POA: Diagnosis not present

## 2017-04-13 DIAGNOSIS — Z940 Kidney transplant status: Secondary | ICD-10-CM | POA: Diagnosis not present

## 2017-05-17 DIAGNOSIS — E039 Hypothyroidism, unspecified: Secondary | ICD-10-CM | POA: Diagnosis not present

## 2017-05-17 DIAGNOSIS — E7800 Pure hypercholesterolemia, unspecified: Secondary | ICD-10-CM | POA: Diagnosis not present

## 2017-05-17 DIAGNOSIS — Z940 Kidney transplant status: Secondary | ICD-10-CM | POA: Diagnosis not present

## 2017-05-17 DIAGNOSIS — Z23 Encounter for immunization: Secondary | ICD-10-CM | POA: Diagnosis not present

## 2017-05-17 DIAGNOSIS — I10 Essential (primary) hypertension: Secondary | ICD-10-CM | POA: Diagnosis not present

## 2017-05-17 DIAGNOSIS — E1165 Type 2 diabetes mellitus with hyperglycemia: Secondary | ICD-10-CM | POA: Diagnosis not present

## 2017-06-07 DIAGNOSIS — E785 Hyperlipidemia, unspecified: Secondary | ICD-10-CM | POA: Diagnosis not present

## 2017-06-07 DIAGNOSIS — D631 Anemia in chronic kidney disease: Secondary | ICD-10-CM | POA: Diagnosis not present

## 2017-06-07 DIAGNOSIS — E1129 Type 2 diabetes mellitus with other diabetic kidney complication: Secondary | ICD-10-CM | POA: Diagnosis not present

## 2017-06-07 DIAGNOSIS — Z940 Kidney transplant status: Secondary | ICD-10-CM | POA: Diagnosis not present

## 2017-06-17 DIAGNOSIS — E1129 Type 2 diabetes mellitus with other diabetic kidney complication: Secondary | ICD-10-CM | POA: Diagnosis not present

## 2017-06-17 DIAGNOSIS — I129 Hypertensive chronic kidney disease with stage 1 through stage 4 chronic kidney disease, or unspecified chronic kidney disease: Secondary | ICD-10-CM | POA: Diagnosis not present

## 2017-06-17 DIAGNOSIS — E663 Overweight: Secondary | ICD-10-CM | POA: Diagnosis not present

## 2017-06-17 DIAGNOSIS — T829XXA Unspecified complication of cardiac and vascular prosthetic device, implant and graft, initial encounter: Secondary | ICD-10-CM | POA: Diagnosis not present

## 2017-06-17 DIAGNOSIS — N183 Chronic kidney disease, stage 3 (moderate): Secondary | ICD-10-CM | POA: Diagnosis not present

## 2017-06-17 DIAGNOSIS — N2581 Secondary hyperparathyroidism of renal origin: Secondary | ICD-10-CM | POA: Diagnosis not present

## 2017-06-17 DIAGNOSIS — D899 Disorder involving the immune mechanism, unspecified: Secondary | ICD-10-CM | POA: Diagnosis not present

## 2017-06-17 DIAGNOSIS — E669 Obesity, unspecified: Secondary | ICD-10-CM | POA: Diagnosis not present

## 2017-06-27 DIAGNOSIS — N183 Chronic kidney disease, stage 3 (moderate): Secondary | ICD-10-CM | POA: Diagnosis not present

## 2017-06-27 DIAGNOSIS — E1122 Type 2 diabetes mellitus with diabetic chronic kidney disease: Secondary | ICD-10-CM | POA: Diagnosis not present

## 2017-06-27 DIAGNOSIS — I129 Hypertensive chronic kidney disease with stage 1 through stage 4 chronic kidney disease, or unspecified chronic kidney disease: Secondary | ICD-10-CM | POA: Diagnosis not present

## 2017-06-27 DIAGNOSIS — N08 Glomerular disorders in diseases classified elsewhere: Secondary | ICD-10-CM | POA: Diagnosis not present

## 2017-08-03 DIAGNOSIS — E1129 Type 2 diabetes mellitus with other diabetic kidney complication: Secondary | ICD-10-CM | POA: Diagnosis not present

## 2017-08-03 DIAGNOSIS — D631 Anemia in chronic kidney disease: Secondary | ICD-10-CM | POA: Diagnosis not present

## 2017-08-03 DIAGNOSIS — E785 Hyperlipidemia, unspecified: Secondary | ICD-10-CM | POA: Diagnosis not present

## 2017-08-03 DIAGNOSIS — Z940 Kidney transplant status: Secondary | ICD-10-CM | POA: Diagnosis not present

## 2017-09-21 DIAGNOSIS — E1165 Type 2 diabetes mellitus with hyperglycemia: Secondary | ICD-10-CM | POA: Diagnosis not present

## 2017-09-21 DIAGNOSIS — Z940 Kidney transplant status: Secondary | ICD-10-CM | POA: Diagnosis not present

## 2017-09-21 DIAGNOSIS — E7800 Pure hypercholesterolemia, unspecified: Secondary | ICD-10-CM | POA: Diagnosis not present

## 2017-09-21 DIAGNOSIS — E039 Hypothyroidism, unspecified: Secondary | ICD-10-CM | POA: Diagnosis not present

## 2017-09-21 DIAGNOSIS — I10 Essential (primary) hypertension: Secondary | ICD-10-CM | POA: Diagnosis not present

## 2017-10-04 DIAGNOSIS — I129 Hypertensive chronic kidney disease with stage 1 through stage 4 chronic kidney disease, or unspecified chronic kidney disease: Secondary | ICD-10-CM | POA: Diagnosis not present

## 2017-10-04 DIAGNOSIS — E785 Hyperlipidemia, unspecified: Secondary | ICD-10-CM | POA: Diagnosis not present

## 2017-10-04 DIAGNOSIS — E1129 Type 2 diabetes mellitus with other diabetic kidney complication: Secondary | ICD-10-CM | POA: Diagnosis not present

## 2017-10-04 DIAGNOSIS — Z940 Kidney transplant status: Secondary | ICD-10-CM | POA: Diagnosis not present

## 2017-10-04 DIAGNOSIS — D631 Anemia in chronic kidney disease: Secondary | ICD-10-CM | POA: Diagnosis not present

## 2017-10-04 DIAGNOSIS — N2581 Secondary hyperparathyroidism of renal origin: Secondary | ICD-10-CM | POA: Diagnosis not present

## 2017-10-06 DIAGNOSIS — T829XXA Unspecified complication of cardiac and vascular prosthetic device, implant and graft, initial encounter: Secondary | ICD-10-CM | POA: Diagnosis not present

## 2017-10-06 DIAGNOSIS — E669 Obesity, unspecified: Secondary | ICD-10-CM | POA: Diagnosis not present

## 2017-10-06 DIAGNOSIS — E663 Overweight: Secondary | ICD-10-CM | POA: Diagnosis not present

## 2017-10-06 DIAGNOSIS — N183 Chronic kidney disease, stage 3 (moderate): Secondary | ICD-10-CM | POA: Diagnosis not present

## 2017-10-06 DIAGNOSIS — D899 Disorder involving the immune mechanism, unspecified: Secondary | ICD-10-CM | POA: Diagnosis not present

## 2017-10-06 DIAGNOSIS — N2581 Secondary hyperparathyroidism of renal origin: Secondary | ICD-10-CM | POA: Diagnosis not present

## 2017-10-06 DIAGNOSIS — E1122 Type 2 diabetes mellitus with diabetic chronic kidney disease: Secondary | ICD-10-CM | POA: Diagnosis not present

## 2017-10-06 DIAGNOSIS — Z940 Kidney transplant status: Secondary | ICD-10-CM | POA: Diagnosis not present

## 2017-10-06 DIAGNOSIS — I129 Hypertensive chronic kidney disease with stage 1 through stage 4 chronic kidney disease, or unspecified chronic kidney disease: Secondary | ICD-10-CM | POA: Diagnosis not present

## 2017-10-06 DIAGNOSIS — D631 Anemia in chronic kidney disease: Secondary | ICD-10-CM | POA: Diagnosis not present

## 2017-11-15 ENCOUNTER — Ambulatory Visit: Payer: Medicare Other | Admitting: Registered"

## 2017-11-16 ENCOUNTER — Encounter: Payer: Medicare Other | Attending: Endocrinology | Admitting: Registered"

## 2017-11-16 ENCOUNTER — Encounter: Payer: Self-pay | Admitting: Registered"

## 2017-11-16 DIAGNOSIS — Z713 Dietary counseling and surveillance: Secondary | ICD-10-CM | POA: Insufficient documentation

## 2017-11-16 DIAGNOSIS — E1165 Type 2 diabetes mellitus with hyperglycemia: Secondary | ICD-10-CM

## 2017-11-16 DIAGNOSIS — E119 Type 2 diabetes mellitus without complications: Secondary | ICD-10-CM | POA: Insufficient documentation

## 2017-11-16 NOTE — Progress Notes (Signed)
Diabetes Self-Management Education  Visit Type: First/Initial  Appt. Start Time: 0936 Appt. End Time: 1660  11/16/2017  [name], identified by name and date of birth, is a 62 y.o. female with a diagnosis of Diabetes: Type 2.   ASSESSMENT  Weight 218 lb 1.6 oz (98.9 kg). Body mass index is 39.89 kg/m.   Pt reports that she struggles with sweets. She reports that she feels she does well with balancing her meals, but struggles with what to eat for snacks. Pt also reports that she often forgets to check her blood sugar before meals due to being overly hungry at mealtimes. She also reports sometimes not having much for breakfast. She reports that her carbohydrate intake in inconsistent. Pt reports drinking sweetened beverages regularly (juice, sweet tea, or lemonade). Pt reports that she tries to avoid drinking soda. Pt states she usually goes to bed around 9-10 pm. Pt reports that she has not been going to the gym as often lately due to her husband having cancer. Reports she may start walking in neighborhood instead of going to gym so she won't have to leave her husband for as long. Pt reports having many responsibilities/commitments including caring for her husband who has cancer, helping with grandchild and daughter and attending events at her church.   Diabetes Self-Management Education - 11/16/17 0947      Visit Information   Visit Type  First/Initial      Initial Visit   Diabetes Type  Type 2    Are you currently following a meal plan?  No    Are you taking your medications as prescribed?  Yes    Date Diagnosed  2001      Psychosocial Assessment   Patient Belief/Attitude about Diabetes  Motivated to manage diabetes    Self-care barriers  Unable to determine    Self-management support  Family;Doctor's office Reports that her daughter is a CNA and has been a good support.     Other persons present  Patient    Patient Concerns  Nutrition/Meal planning    Special Needs   None    Preferred Learning Style  No preference indicated    Learning Readiness  Ready    How often do you need to have someone help you when you read instructions, pamphlets, or other written materials from your doctor or pharmacy?  1 - Never    What is the last grade level you completed in school?  MS degree.       Pre-Education Assessment   Patient understands the diabetes disease and treatment process.  Needs Review    Patient understands incorporating nutritional management into lifestyle.  Needs Instruction    Patient undertands incorporating physical activity into lifestyle.  Needs Review    Patient understands using medications safely.  Demonstrates understanding / competency    Patient understands monitoring blood glucose, interpreting and using results  Needs Review    Patient understands prevention, detection, and treatment of acute complications.  Needs Instruction    Patient understands prevention, detection, and treatment of chronic complications.  Needs Instruction    Patient understands how to develop strategies to address psychosocial issues.  Needs Instruction    Patient understands how to develop strategies to promote health/change behavior.  Needs Instruction      Complications   Last HgB A1C per patient/outside source  8.8 %    How often do you check your blood sugar?  1-2 times/day    Fasting Blood glucose range (  mg/dL)  130-179;70-129    Postprandial Blood glucose range (mg/dL)  130-179    Number of hypoglycemic episodes per month  3    Can you tell when your blood sugar is low?  Yes    What do you do if your blood sugar is low?  Pt reports she will eat a piece of candy or crackers.     Number of hyperglycemic episodes per week  2    Can you tell when your blood sugar is high?  No    Have you had a dilated eye exam in the past 12 months?  Yes    Have you had a dental exam in the past 12 months?  No    Are you checking your feet?  Yes    How many days per week are  you checking your feet?  3      Dietary Intake   Breakfast  (730 AM) 1 boiled egg, half a piece of sausage, 2 pieces of bacon, 1 piece toast with American cheese, 1 cup apple juice and 1 cup of coffee (2 splenda, some creamer), water    Snack (morning)  (10:30 AM) 4 crackers, a little cheese, water      Lunch  (1 PM) chicken salad sandwich on wheat bread, about a handful of chips, water, Fanta soft drink    Snack (afternoon)  (2PM) 4 celery sticks with ranch dip    Dinner  (8PM) 6 inch sub sandwich-steak and cheese, lettuce, tomato, mayo, water     Snack (evening)  None reported.    Beverage(s)  Tries to have about 1.5-2 liters of water per day, 1 cup coffee, 1 cup juice, 1 Fanta soft drink       Exercise   Exercise Type  Moderate (swimming / aerobic walking);Light (walking / raking leaves) walking, stationary bicycle, sometimes attends classes at [facility]     How many days per week to you exercise?  3    How many minutes per day do you exercise?  30    Total minutes per week of exercise  90      Patient Education   Previous Diabetes Education  Yes (please comment) 2 years ago.     Disease state   Definition of diabetes, type 1 and 2, and the diagnosis of diabetes    Nutrition management   Role of diet in the treatment of diabetes and the relationship between the three main macronutrients and blood glucose level;Food label reading, portion sizes and measuring food.;Reviewed blood glucose goals for pre and post meals and how to evaluate the patients' food intake on their blood glucose level.;Information on hints to eating out and maintain blood glucose control.;Carbohydrate counting    Physical activity and exercise   Role of exercise on diabetes management, blood pressure control and cardiac health.;Identified with patient nutritional and/or medication changes necessary with exercise.    Monitoring  Purpose and frequency of SMBG.;Interpreting lab values - A1C, lipid, urine microalbumina.;Yearly  dilated eye exam;Daily foot exams;Identified appropriate SMBG and/or A1C goals.;Taught/discussed recording of test results and interpretation of SMBG.    Acute complications  Taught treatment of hypoglycemia - the 15 rule.;Covered sick day management with medication and food.;Discussed and identified patients' treatment of hyperglycemia.    Chronic complications  Relationship between chronic complications and blood glucose control;Lipid levels, blood glucose control and heart disease;Assessed and discussed foot care and prevention of foot problems;Retinopathy and reason for yearly dilated eye exams;Reviewed with patient heart disease,  higher risk of, and prevention    Psychosocial adjustment  Role of stress on diabetes;Travel strategies;Identified and addressed patients feelings and concerns about diabetes;Worked with patient to identify barriers to care and solutions      Individualized Goals (developed by patient)   Nutrition  Follow meal plan discussed;General guidelines for healthy choices and portions discussed    Physical Activity  Exercise 3-5 times per week;30 minutes per day    Medications  take my medication as prescribed    Monitoring   test my blood glucose as discussed      Post-Education Assessment   Patient understands the diabetes disease and treatment process.  Demonstrates understanding / competency    Patient understands incorporating nutritional management into lifestyle.  Demonstrates understanding / competency    Patient undertands incorporating physical activity into lifestyle.  Demonstrates understanding / competency    Patient understands using medications safely.  Demonstrates understanding / competency    Patient understands monitoring blood glucose, interpreting and using results  Demonstrates understanding / competency    Patient understands prevention, detection, and treatment of acute complications.  Demonstrates understanding / competency    Patient understands  prevention, detection, and treatment of chronic complications.  Demonstrates understanding / competency    Patient understands how to develop strategies to address psychosocial issues.  Demonstrates understanding / competency    Patient understands how to develop strategies to promote health/change behavior.  Demonstrates understanding / competency      Outcomes   Expected Outcomes  Demonstrated interest in learning. Expect positive outcomes    Future DMSE  PRN    Program Status  Completed       Individualized Plan for Diabetes Self-Management Training:   Learning Objective:  Patient will have a greater understanding of diabetes self-management. Patient education plan is to attend individual and/or group sessions per assessed needs and concerns.   Plan:   Patient Instructions  Make sure to get in three meals per day. Try to have balanced meals like the My Plate example (see handout).  Goal: Include a consistent amount of carbohydrates at each meal-recommend 2-3 carbohydrate choices at each meal.   Sweetened beverages should be counted in your carbohydrate count as well. Recommend including more water and less sugar sweetened beverages.   If meals are more than 5 hours apart, recommend having a snack (see handout).   Make physical activity a part of your week. Try to include at least 30 minutes of physical activity 5 days each week or at least 150 minutes per week. Regular physical activity promotes overall health-including helping to reduce risk for heart disease, promotes blood sugar management, helps with promoting mental health, and helps [place] sleep better.    Blood Sugar Monitoring  Goal: Check blood sugar per MD recommendations. Checking 1-2 hours after meals in addition to before meals can also be beneficial in helping with blood sugar management as it provides more information about how your dietary intake is affecting your blood sugar.      Expected Outcomes:  Demonstrated  interest in learning. Expect positive outcomes  Education material provided: Living Well with Diabetes, Meal plan card, My Plate and Snack sheet  If problems or questions, patient to contact team via:  Phone and Email  Future DSME appointment: PRN

## 2017-11-16 NOTE — Patient Instructions (Signed)
Make sure to get in three meals per day. Try to have balanced meals like the My Plate example (see handout).  Goal: Include a consistent amount of carbohydrates at each meal-recommend 2-3 carbohydrate choices at each meal.   Sweetened beverages should be counted in your carbohydrate count as well. Recommend including more water and less sugar sweetened beverages.   If meals are more than 5 hours apart, recommend having a snack (see handout).   Make physical activity a part of your week. Try to include at least 30 minutes of physical activity 5 days each week or at least 150 minutes per week. Regular physical activity promotes overall health-including helping to reduce risk for heart disease, promotes blood sugar management, helps with promoting mental health, and helps [place] sleep better.    Blood Sugar Monitoring  Goal: Check blood sugar per MD recommendations. Checking 1-2 hours after meals in addition to before meals can also be beneficial in helping with blood sugar management as it provides more information about how your dietary intake is affecting your blood sugar.

## 2017-12-05 DIAGNOSIS — I129 Hypertensive chronic kidney disease with stage 1 through stage 4 chronic kidney disease, or unspecified chronic kidney disease: Secondary | ICD-10-CM | POA: Diagnosis not present

## 2017-12-05 DIAGNOSIS — E1129 Type 2 diabetes mellitus with other diabetic kidney complication: Secondary | ICD-10-CM | POA: Diagnosis not present

## 2017-12-05 DIAGNOSIS — D631 Anemia in chronic kidney disease: Secondary | ICD-10-CM | POA: Diagnosis not present

## 2017-12-05 DIAGNOSIS — Z940 Kidney transplant status: Secondary | ICD-10-CM | POA: Diagnosis not present

## 2017-12-05 DIAGNOSIS — E785 Hyperlipidemia, unspecified: Secondary | ICD-10-CM | POA: Diagnosis not present

## 2018-01-03 DIAGNOSIS — N2581 Secondary hyperparathyroidism of renal origin: Secondary | ICD-10-CM | POA: Diagnosis not present

## 2018-01-03 DIAGNOSIS — E1122 Type 2 diabetes mellitus with diabetic chronic kidney disease: Secondary | ICD-10-CM | POA: Diagnosis not present

## 2018-01-03 DIAGNOSIS — Z940 Kidney transplant status: Secondary | ICD-10-CM | POA: Diagnosis not present

## 2018-01-03 DIAGNOSIS — I129 Hypertensive chronic kidney disease with stage 1 through stage 4 chronic kidney disease, or unspecified chronic kidney disease: Secondary | ICD-10-CM | POA: Diagnosis not present

## 2018-01-03 DIAGNOSIS — T829XXA Unspecified complication of cardiac and vascular prosthetic device, implant and graft, initial encounter: Secondary | ICD-10-CM | POA: Diagnosis not present

## 2018-01-03 DIAGNOSIS — D631 Anemia in chronic kidney disease: Secondary | ICD-10-CM | POA: Diagnosis not present

## 2018-01-03 DIAGNOSIS — E669 Obesity, unspecified: Secondary | ICD-10-CM | POA: Diagnosis not present

## 2018-01-03 DIAGNOSIS — E663 Overweight: Secondary | ICD-10-CM | POA: Diagnosis not present

## 2018-01-03 DIAGNOSIS — D899 Disorder involving the immune mechanism, unspecified: Secondary | ICD-10-CM | POA: Diagnosis not present

## 2018-01-03 DIAGNOSIS — N183 Chronic kidney disease, stage 3 (moderate): Secondary | ICD-10-CM | POA: Diagnosis not present

## 2018-01-25 DIAGNOSIS — I129 Hypertensive chronic kidney disease with stage 1 through stage 4 chronic kidney disease, or unspecified chronic kidney disease: Secondary | ICD-10-CM | POA: Diagnosis not present

## 2018-01-26 DIAGNOSIS — I129 Hypertensive chronic kidney disease with stage 1 through stage 4 chronic kidney disease, or unspecified chronic kidney disease: Secondary | ICD-10-CM | POA: Diagnosis not present

## 2018-01-26 DIAGNOSIS — E1129 Type 2 diabetes mellitus with other diabetic kidney complication: Secondary | ICD-10-CM | POA: Diagnosis not present

## 2018-01-26 DIAGNOSIS — Z940 Kidney transplant status: Secondary | ICD-10-CM | POA: Diagnosis not present

## 2018-01-26 DIAGNOSIS — D631 Anemia in chronic kidney disease: Secondary | ICD-10-CM | POA: Diagnosis not present

## 2018-01-26 DIAGNOSIS — E785 Hyperlipidemia, unspecified: Secondary | ICD-10-CM | POA: Diagnosis not present

## 2018-02-15 DIAGNOSIS — N183 Chronic kidney disease, stage 3 (moderate): Secondary | ICD-10-CM | POA: Diagnosis not present

## 2018-02-15 DIAGNOSIS — E1122 Type 2 diabetes mellitus with diabetic chronic kidney disease: Secondary | ICD-10-CM | POA: Diagnosis not present

## 2018-02-15 DIAGNOSIS — I129 Hypertensive chronic kidney disease with stage 1 through stage 4 chronic kidney disease, or unspecified chronic kidney disease: Secondary | ICD-10-CM | POA: Diagnosis not present

## 2018-02-15 DIAGNOSIS — E559 Vitamin D deficiency, unspecified: Secondary | ICD-10-CM | POA: Diagnosis not present

## 2018-02-15 DIAGNOSIS — N08 Glomerular disorders in diseases classified elsewhere: Secondary | ICD-10-CM | POA: Diagnosis not present

## 2018-02-15 DIAGNOSIS — Z1389 Encounter for screening for other disorder: Secondary | ICD-10-CM | POA: Diagnosis not present

## 2018-02-23 ENCOUNTER — Other Ambulatory Visit: Payer: Self-pay | Admitting: Internal Medicine

## 2018-02-23 DIAGNOSIS — Z1231 Encounter for screening mammogram for malignant neoplasm of breast: Secondary | ICD-10-CM

## 2018-02-28 DIAGNOSIS — Z940 Kidney transplant status: Secondary | ICD-10-CM | POA: Diagnosis not present

## 2018-02-28 DIAGNOSIS — I129 Hypertensive chronic kidney disease with stage 1 through stage 4 chronic kidney disease, or unspecified chronic kidney disease: Secondary | ICD-10-CM | POA: Diagnosis not present

## 2018-03-10 DIAGNOSIS — M67911 Unspecified disorder of synovium and tendon, right shoulder: Secondary | ICD-10-CM | POA: Diagnosis not present

## 2018-03-15 DIAGNOSIS — E7800 Pure hypercholesterolemia, unspecified: Secondary | ICD-10-CM | POA: Diagnosis not present

## 2018-03-15 DIAGNOSIS — Z940 Kidney transplant status: Secondary | ICD-10-CM | POA: Diagnosis not present

## 2018-03-15 DIAGNOSIS — E039 Hypothyroidism, unspecified: Secondary | ICD-10-CM | POA: Diagnosis not present

## 2018-03-15 DIAGNOSIS — E1165 Type 2 diabetes mellitus with hyperglycemia: Secondary | ICD-10-CM | POA: Diagnosis not present

## 2018-03-15 DIAGNOSIS — I10 Essential (primary) hypertension: Secondary | ICD-10-CM | POA: Diagnosis not present

## 2018-03-17 ENCOUNTER — Ambulatory Visit
Admission: RE | Admit: 2018-03-17 | Discharge: 2018-03-17 | Disposition: A | Discharge status: Home / Self Care | Payer: Medicare Other | Source: Ambulatory Visit | Attending: Internal Medicine | Admitting: Internal Medicine

## 2018-03-17 DIAGNOSIS — Z1231 Encounter for screening mammogram for malignant neoplasm of breast: Secondary | ICD-10-CM | POA: Diagnosis not present

## 2018-04-04 DIAGNOSIS — R197 Diarrhea, unspecified: Secondary | ICD-10-CM | POA: Diagnosis not present

## 2018-04-04 DIAGNOSIS — Z7952 Long term (current) use of systemic steroids: Secondary | ICD-10-CM | POA: Diagnosis not present

## 2018-04-04 DIAGNOSIS — M7989 Other specified soft tissue disorders: Secondary | ICD-10-CM | POA: Diagnosis not present

## 2018-04-04 DIAGNOSIS — D8989 Other specified disorders involving the immune mechanism, not elsewhere classified: Secondary | ICD-10-CM | POA: Diagnosis not present

## 2018-04-04 DIAGNOSIS — E119 Type 2 diabetes mellitus without complications: Secondary | ICD-10-CM | POA: Diagnosis not present

## 2018-04-04 DIAGNOSIS — E213 Hyperparathyroidism, unspecified: Secondary | ICD-10-CM | POA: Diagnosis not present

## 2018-04-04 DIAGNOSIS — E1129 Type 2 diabetes mellitus with other diabetic kidney complication: Secondary | ICD-10-CM | POA: Diagnosis not present

## 2018-04-04 DIAGNOSIS — Z4822 Encounter for aftercare following kidney transplant: Secondary | ICD-10-CM | POA: Diagnosis not present

## 2018-04-04 DIAGNOSIS — I10 Essential (primary) hypertension: Secondary | ICD-10-CM | POA: Diagnosis not present

## 2018-04-04 DIAGNOSIS — Z794 Long term (current) use of insulin: Secondary | ICD-10-CM | POA: Diagnosis not present

## 2018-04-04 DIAGNOSIS — E878 Other disorders of electrolyte and fluid balance, not elsewhere classified: Secondary | ICD-10-CM | POA: Diagnosis not present

## 2018-04-04 DIAGNOSIS — Z940 Kidney transplant status: Secondary | ICD-10-CM | POA: Diagnosis not present

## 2018-04-04 DIAGNOSIS — Z79899 Other long term (current) drug therapy: Secondary | ICD-10-CM | POA: Diagnosis not present

## 2018-04-04 DIAGNOSIS — K219 Gastro-esophageal reflux disease without esophagitis: Secondary | ICD-10-CM | POA: Diagnosis not present

## 2018-04-04 DIAGNOSIS — Z792 Long term (current) use of antibiotics: Secondary | ICD-10-CM | POA: Diagnosis not present

## 2018-04-07 DIAGNOSIS — M67912 Unspecified disorder of synovium and tendon, left shoulder: Secondary | ICD-10-CM | POA: Diagnosis not present

## 2018-04-07 DIAGNOSIS — Z23 Encounter for immunization: Secondary | ICD-10-CM | POA: Diagnosis not present

## 2018-04-13 DIAGNOSIS — M7542 Impingement syndrome of left shoulder: Secondary | ICD-10-CM | POA: Diagnosis not present

## 2018-04-14 DIAGNOSIS — M7542 Impingement syndrome of left shoulder: Secondary | ICD-10-CM | POA: Diagnosis not present

## 2018-04-17 DIAGNOSIS — M7542 Impingement syndrome of left shoulder: Secondary | ICD-10-CM | POA: Diagnosis not present

## 2018-04-19 DIAGNOSIS — N183 Chronic kidney disease, stage 3 (moderate): Secondary | ICD-10-CM | POA: Diagnosis not present

## 2018-04-19 DIAGNOSIS — Z940 Kidney transplant status: Secondary | ICD-10-CM | POA: Diagnosis not present

## 2018-04-19 DIAGNOSIS — M7542 Impingement syndrome of left shoulder: Secondary | ICD-10-CM | POA: Diagnosis not present

## 2018-04-24 DIAGNOSIS — M7542 Impingement syndrome of left shoulder: Secondary | ICD-10-CM | POA: Diagnosis not present

## 2018-05-01 DIAGNOSIS — M7542 Impingement syndrome of left shoulder: Secondary | ICD-10-CM | POA: Diagnosis not present

## 2018-05-03 DIAGNOSIS — Z940 Kidney transplant status: Secondary | ICD-10-CM | POA: Diagnosis not present

## 2018-05-03 DIAGNOSIS — I129 Hypertensive chronic kidney disease with stage 1 through stage 4 chronic kidney disease, or unspecified chronic kidney disease: Secondary | ICD-10-CM | POA: Diagnosis not present

## 2018-05-03 DIAGNOSIS — E1129 Type 2 diabetes mellitus with other diabetic kidney complication: Secondary | ICD-10-CM | POA: Diagnosis not present

## 2018-05-03 DIAGNOSIS — E785 Hyperlipidemia, unspecified: Secondary | ICD-10-CM | POA: Diagnosis not present

## 2018-05-03 DIAGNOSIS — D631 Anemia in chronic kidney disease: Secondary | ICD-10-CM | POA: Diagnosis not present

## 2018-05-04 DIAGNOSIS — M7542 Impingement syndrome of left shoulder: Secondary | ICD-10-CM | POA: Diagnosis not present

## 2018-05-09 DIAGNOSIS — M7542 Impingement syndrome of left shoulder: Secondary | ICD-10-CM | POA: Diagnosis not present

## 2018-05-12 DIAGNOSIS — M67912 Unspecified disorder of synovium and tendon, left shoulder: Secondary | ICD-10-CM | POA: Diagnosis not present

## 2018-05-12 DIAGNOSIS — M7542 Impingement syndrome of left shoulder: Secondary | ICD-10-CM | POA: Diagnosis not present

## 2018-05-16 DIAGNOSIS — M7542 Impingement syndrome of left shoulder: Secondary | ICD-10-CM | POA: Diagnosis not present

## 2018-05-19 DIAGNOSIS — M7542 Impingement syndrome of left shoulder: Secondary | ICD-10-CM | POA: Diagnosis not present

## 2018-05-22 DIAGNOSIS — M7542 Impingement syndrome of left shoulder: Secondary | ICD-10-CM | POA: Diagnosis not present

## 2018-05-24 DIAGNOSIS — M7542 Impingement syndrome of left shoulder: Secondary | ICD-10-CM | POA: Diagnosis not present

## 2018-05-29 DIAGNOSIS — M7542 Impingement syndrome of left shoulder: Secondary | ICD-10-CM | POA: Diagnosis not present

## 2018-05-31 DIAGNOSIS — M7542 Impingement syndrome of left shoulder: Secondary | ICD-10-CM | POA: Diagnosis not present

## 2018-06-05 DIAGNOSIS — M7542 Impingement syndrome of left shoulder: Secondary | ICD-10-CM | POA: Diagnosis not present

## 2018-06-12 DIAGNOSIS — M7542 Impingement syndrome of left shoulder: Secondary | ICD-10-CM | POA: Diagnosis not present

## 2018-06-28 DIAGNOSIS — N189 Chronic kidney disease, unspecified: Secondary | ICD-10-CM | POA: Diagnosis not present

## 2018-06-28 DIAGNOSIS — Z940 Kidney transplant status: Secondary | ICD-10-CM | POA: Diagnosis not present

## 2018-06-28 DIAGNOSIS — E785 Hyperlipidemia, unspecified: Secondary | ICD-10-CM | POA: Diagnosis not present

## 2018-06-28 DIAGNOSIS — E1129 Type 2 diabetes mellitus with other diabetic kidney complication: Secondary | ICD-10-CM | POA: Diagnosis not present

## 2018-06-28 DIAGNOSIS — I129 Hypertensive chronic kidney disease with stage 1 through stage 4 chronic kidney disease, or unspecified chronic kidney disease: Secondary | ICD-10-CM | POA: Diagnosis not present

## 2018-07-03 DIAGNOSIS — D899 Disorder involving the immune mechanism, unspecified: Secondary | ICD-10-CM | POA: Diagnosis not present

## 2018-07-03 DIAGNOSIS — B353 Tinea pedis: Secondary | ICD-10-CM | POA: Diagnosis not present

## 2018-07-03 DIAGNOSIS — R197 Diarrhea, unspecified: Secondary | ICD-10-CM | POA: Diagnosis not present

## 2018-07-03 DIAGNOSIS — E1129 Type 2 diabetes mellitus with other diabetic kidney complication: Secondary | ICD-10-CM | POA: Diagnosis not present

## 2018-07-03 DIAGNOSIS — D229 Melanocytic nevi, unspecified: Secondary | ICD-10-CM | POA: Diagnosis not present

## 2018-07-03 DIAGNOSIS — B351 Tinea unguium: Secondary | ICD-10-CM | POA: Diagnosis not present

## 2018-07-03 DIAGNOSIS — Z940 Kidney transplant status: Secondary | ICD-10-CM | POA: Diagnosis not present

## 2018-07-03 DIAGNOSIS — I10 Essential (primary) hypertension: Secondary | ICD-10-CM | POA: Diagnosis not present

## 2018-07-03 DIAGNOSIS — B352 Tinea manuum: Secondary | ICD-10-CM | POA: Diagnosis not present

## 2018-07-03 DIAGNOSIS — Z794 Long term (current) use of insulin: Secondary | ICD-10-CM | POA: Diagnosis not present

## 2018-07-18 DIAGNOSIS — E1165 Type 2 diabetes mellitus with hyperglycemia: Secondary | ICD-10-CM | POA: Diagnosis not present

## 2018-07-18 DIAGNOSIS — E7800 Pure hypercholesterolemia, unspecified: Secondary | ICD-10-CM | POA: Diagnosis not present

## 2018-07-18 DIAGNOSIS — E039 Hypothyroidism, unspecified: Secondary | ICD-10-CM | POA: Diagnosis not present

## 2018-07-18 DIAGNOSIS — I10 Essential (primary) hypertension: Secondary | ICD-10-CM | POA: Diagnosis not present

## 2018-07-18 DIAGNOSIS — Z940 Kidney transplant status: Secondary | ICD-10-CM | POA: Diagnosis not present

## 2018-07-21 DIAGNOSIS — E669 Obesity, unspecified: Secondary | ICD-10-CM | POA: Diagnosis not present

## 2018-07-21 DIAGNOSIS — E663 Overweight: Secondary | ICD-10-CM | POA: Diagnosis not present

## 2018-07-21 DIAGNOSIS — D899 Disorder involving the immune mechanism, unspecified: Secondary | ICD-10-CM | POA: Diagnosis not present

## 2018-07-21 DIAGNOSIS — E1122 Type 2 diabetes mellitus with diabetic chronic kidney disease: Secondary | ICD-10-CM | POA: Diagnosis not present

## 2018-07-21 DIAGNOSIS — D631 Anemia in chronic kidney disease: Secondary | ICD-10-CM | POA: Diagnosis not present

## 2018-07-21 DIAGNOSIS — T829XXA Unspecified complication of cardiac and vascular prosthetic device, implant and graft, initial encounter: Secondary | ICD-10-CM | POA: Diagnosis not present

## 2018-07-21 DIAGNOSIS — N183 Chronic kidney disease, stage 3 (moderate): Secondary | ICD-10-CM | POA: Diagnosis not present

## 2018-07-21 DIAGNOSIS — N2581 Secondary hyperparathyroidism of renal origin: Secondary | ICD-10-CM | POA: Diagnosis not present

## 2018-07-21 DIAGNOSIS — I129 Hypertensive chronic kidney disease with stage 1 through stage 4 chronic kidney disease, or unspecified chronic kidney disease: Secondary | ICD-10-CM | POA: Diagnosis not present

## 2018-07-21 DIAGNOSIS — Z940 Kidney transplant status: Secondary | ICD-10-CM | POA: Diagnosis not present

## 2018-08-13 ENCOUNTER — Other Ambulatory Visit: Payer: Self-pay | Admitting: Internal Medicine

## 2018-08-21 ENCOUNTER — Ambulatory Visit (INDEPENDENT_AMBULATORY_CARE_PROVIDER_SITE_OTHER): Payer: Medicare HMO | Admitting: Internal Medicine

## 2018-08-21 ENCOUNTER — Encounter: Payer: Self-pay | Admitting: Internal Medicine

## 2018-08-21 VITALS — BP 126/74 | HR 61 | Temp 97.5°F | Ht 60.25 in | Wt 206.8 lb

## 2018-08-21 DIAGNOSIS — N183 Chronic kidney disease, stage 3 unspecified: Secondary | ICD-10-CM

## 2018-08-21 DIAGNOSIS — I129 Hypertensive chronic kidney disease with stage 1 through stage 4 chronic kidney disease, or unspecified chronic kidney disease: Secondary | ICD-10-CM

## 2018-08-21 DIAGNOSIS — E7800 Pure hypercholesterolemia, unspecified: Secondary | ICD-10-CM

## 2018-08-21 DIAGNOSIS — Z794 Long term (current) use of insulin: Secondary | ICD-10-CM

## 2018-08-21 DIAGNOSIS — Z6841 Body Mass Index (BMI) 40.0 and over, adult: Secondary | ICD-10-CM | POA: Diagnosis not present

## 2018-08-21 DIAGNOSIS — E1122 Type 2 diabetes mellitus with diabetic chronic kidney disease: Secondary | ICD-10-CM

## 2018-08-21 DIAGNOSIS — Z940 Kidney transplant status: Secondary | ICD-10-CM

## 2018-08-21 DIAGNOSIS — D573 Sickle-cell trait: Secondary | ICD-10-CM | POA: Diagnosis not present

## 2018-08-21 MED ORDER — PNEUMOCOCCAL 13-VAL CONJ VACC IM SUSP: 0.5000 mL | INTRAMUSCULAR | 0 refills | Status: AC

## 2018-08-21 NOTE — Progress Notes (Signed)
Subjective:     Patient ID: [name] [name] , female    DOB: 03/06/1956 , 63 y.o.   MRN: [id number]   Chief Complaint  Patient presents with  . Hypertension    HPI  Hypertension  This is a chronic problem. The current episode started more than 1 year ago. The problem has been gradually improving since onset. The problem is controlled. Pertinent negatives include no blurred vision, chest pain, palpitations or shortness of breath. Risk factors for coronary artery disease include diabetes mellitus, dyslipidemia, obesity, post-menopausal state and sedentary lifestyle.   She reports compliance with meds.   Past Medical History:  Diagnosis Date  . Blood transfusion   . Chronic kidney disease    s/p transplant, kidney wasn't removed  . Gout due to renal impairment 2010   "before finding out I had kidney failure"  . History of renal transplant   . Hyperlipidemia   . Hypertension   . Sickle cell trait ([place])   . Thyroid disease   . Type II diabetes mellitus ([place])   . UTI (lower urinary tract infection) 12/08/11   "first one ever"     Family History  Problem Relation Age of Onset  . Heart disease Father   . Hypertension Mother   . Breast cancer Cousin   . Hypertension Sister   . Colon cancer Neg Hx   . Esophageal cancer Neg Hx   . Rectal cancer Neg Hx   . Stomach cancer Neg Hx      Current Outpatient Medications:  .  amLODipine (NORVASC) 5 MG tablet, Take 5 mg by mouth daily., Disp: , Rfl:  .  aspirin EC 81 MG tablet, Take 81 mg by mouth daily., Disp: , Rfl:  .  cinacalcet (SENSIPAR) 30 MG tablet, Take 30 mg by mouth daily., Disp: , Rfl:  .  clotrimazole (LOTRIMIN) 1 % cream, Twice daily to scaly areas on feet, toewebs, palms, Disp: , Rfl:  .  glucose blood (ONETOUCH VERIO) test strip, USE TO TEST BLOOD SUGAR 4 TIMES DAILY AS DIRECTED, Disp: , Rfl:  .  Insulin Aspart (NOVOLOG FLEXPEN [place]), Inject 4 Units into the skin 3 (three) times daily. Sliding scale, Disp: , Rfl:  .   Insulin Detemir (LEVEMIR FLEXTOUCH) 100 UNIT/ML Pen, 15 units in the morning and 40 units in the evening, Disp: , Rfl:  .  levothyroxine (SYNTHROID, LEVOTHROID) 25 MCG tablet, Take 25 mcg by mouth daily., Disp: , Rfl:  .  lisinopril (PRINIVIL,ZESTRIL) 10 MG tablet, Take by mouth., Disp: , Rfl:  .  loperamide (IMODIUM) 2 MG capsule, Take by mouth as needed for diarrhea or loose stools. One by mouth four times daily., Disp: , Rfl:  .  metoprolol (LOPRESSOR) 50 MG tablet, Take by mouth 3 (three) times daily. , Disp: , Rfl:  .  mometasone (NASONEX) 50 MCG/ACT nasal spray, Place 2 sprays into the nose daily., Disp: 17 g, Rfl: 12 .  mycophenolate (MYFORTIC) 180 MG EC tablet, Take 180 mg by mouth 2 (two) times daily., Disp: , Rfl:  .  omeprazole (PRILOSEC) 20 MG capsule, Take 20 mg by mouth daily., Disp: , Rfl:  .  predniSONE (DELTASONE) 5 MG tablet, Take 5 mg by mouth daily with breakfast., Disp: , Rfl:  .  simvastatin (ZOCOR) 10 MG tablet, TAKE 1 TABLET BY MOUTH EVERY DAY IN THE EVENING, Disp: 30 tablet, Rfl: 3 .  tacrolimus (PROGRAF) 1 MG capsule, 7 capsules 2 times per day, Disp: , Rfl:  .  terbinafine (LAMISIL) 250 MG tablet, Take by mouth., Disp: , Rfl:  .  pneumococcal 13-valent conjugate vaccine (PREVNAR 13) SUSP injection, Inject 0.5 mLs into the muscle tomorrow at 10 am for 1 dose., Disp: 0.5 mL, Rfl: 0   Allergies  Allergen Reactions  . Codeine Hives  . Acetaminophen-Codeine Rash     Review of Systems  Constitutional: Negative.   Eyes: Negative for blurred vision.  Respiratory: Negative.  Negative for shortness of breath.   Cardiovascular: Negative.  Negative for chest pain and palpitations.  Gastrointestinal: Negative.   Genitourinary: Negative.   Neurological: Negative.   Psychiatric/Behavioral: Negative.      Today's Vitals   08/21/18 1128  BP: 126/74  Pulse: 61  Temp: (!) 97.5 F (36.4 C)  TempSrc: Oral  Weight: 206 lb 12.8 oz (93.8 kg)  Height: 5' 0.25" (1.53 m)   PainSc: 0-No pain   Body mass index is 40.05 kg/m.   Objective:  Physical Exam Vitals signs and nursing note reviewed.  Constitutional:      Appearance: Normal appearance. She is obese.  HENT:     Head: Normocephalic and atraumatic.  Cardiovascular:     Rate and Rhythm: Normal rate and regular rhythm.     Heart sounds: Normal heart sounds.  Neurological:     Mental Status: She is alert.         Assessment And Plan:     1. Hypertensive nephropathy  Well controlled. She will continue with current meds. She is encouraged to avoid adding salt to her foods. She will rto in six months for her next AWV and physical examination.   2. Type 2 diabetes mellitus with stage 3 chronic kidney disease, with long-term current use of insulin ([place])  She is followed by Dr. Chalmers Cater. She reports last a1c was 7.9 in November 2019. She reports this had improved from prior visit. She was congratulated on the improvement and encouraged to keep up the great work.   - Hepatitis C antibody - CMP14+EGFR - Lipid Profile  3. Pure hypercholesterolemia  I will check fasting lipid panel and LFTs today. She is encouraged to cut back on her fried food intake and to increase daily activity.   4. Class 3 severe obesity due to excess calories with serious comorbidity and body mass index (BMI) of 40.0 to 44.9 in adult [facility])  She is encouraged to strive for BMI less than 35 to decrease cardiac risk. She is encouraged to avoid sugary beverages and processed foods. She is encouraged to incorporate more vegetables into her diet. She is also encouraged to incorporate more exercise into her daily routine.  She is reminded that chair exercises count as well! I will reassess at her next visit.   5. History of renal transplant   [name], MD

## 2018-08-21 NOTE — Patient Instructions (Signed)
Diabetes Mellitus and Exercise Exercising regularly is important for your overall health, especially when you have diabetes (diabetes mellitus). Exercising is not only about losing weight. It has many other health benefits, such as increasing muscle strength and bone density and reducing body fat and stress. This leads to improved fitness, flexibility, and endurance, all of which result in better overall health. Exercise has additional benefits for people with diabetes, including:  Reducing appetite.  Helping to lower and control blood glucose.  Lowering blood pressure.  Helping to control amounts of fatty substances (lipids) in the blood, such as cholesterol and triglycerides.  Helping the body to respond better to insulin (improving insulin sensitivity).  Reducing how much insulin the body needs.  Decreasing the risk for heart disease by: ? Lowering cholesterol and triglyceride levels. ? Increasing the levels of good cholesterol. ? Lowering blood glucose levels. What is my activity plan? Your health care provider or certified diabetes educator can help you make a plan for the type and frequency of exercise (activity plan) that works for you. Make sure that you:  Do at least 150 minutes of moderate-intensity or vigorous-intensity exercise each week. This could be brisk walking, biking, or water aerobics. ? Do stretching and strength exercises, such as yoga or weightlifting, at least 2 times a week. ? Spread out your activity over at least 3 days of the week.  Get some form of physical activity every day. ? Do not go more than 2 days in a row without some kind of physical activity. ? Avoid being inactive for more than 30 minutes at a time. Take frequent breaks to walk or stretch.  Choose a type of exercise or activity that you enjoy, and set realistic goals.  Start slowly, and gradually increase the intensity of your exercise over time. What do I need to know about managing my  diabetes?   Check your blood glucose before and after exercising. ? If your blood glucose is 240 mg/dL (13.3 mmol/L) or higher before you exercise, check your urine for ketones. If you have ketones in your urine, do not exercise until your blood glucose returns to normal. ? If your blood glucose is 100 mg/dL (5.6 mmol/L) or lower, eat a snack containing 15-20 grams of carbohydrate. Check your blood glucose 15 minutes after the snack to make sure that your level is above 100 mg/dL (5.6 mmol/L) before you start your exercise.  Know the symptoms of low blood glucose (hypoglycemia) and how to treat it. Your risk for hypoglycemia increases during and after exercise. Common symptoms of hypoglycemia can include: ? Hunger. ? Anxiety. ? Sweating and feeling clammy. ? Confusion. ? Dizziness or feeling light-headed. ? Increased heart rate or palpitations. ? Blurry vision. ? Tingling or numbness around the mouth, lips, or tongue. ? Tremors or shakes. ? Irritability.  Keep a rapid-acting carbohydrate snack available before, during, and after exercise to help prevent or treat hypoglycemia.  Avoid injecting insulin into areas of the body that are going to be exercised. For example, avoid injecting insulin into: ? The arms, when playing tennis. ? The legs, when jogging.  Keep records of your exercise habits. Doing this can help you and your health care provider adjust your diabetes management plan as needed. Write down: ? Food that you eat before and after you exercise. ? Blood glucose levels before and after you exercise. ? The type and amount of exercise you have done. ? When your insulin is expected to peak, if you use   insulin. Avoid exercising at times when your insulin is peaking.  When you start a new exercise or activity, work with your health care provider to make sure the activity is safe for you, and to adjust your insulin, medicines, or food intake as needed.  Drink plenty of water while  you exercise to prevent dehydration or heat stroke. Drink enough fluid to keep your urine clear or pale yellow. Summary  Exercising regularly is important for your overall health, especially when you have diabetes (diabetes mellitus).  Exercising has many health benefits, such as increasing muscle strength and bone density and reducing body fat and stress.  Your health care provider or certified diabetes educator can help you make a plan for the type and frequency of exercise (activity plan) that works for you.  When you start a new exercise or activity, work with your health care provider to make sure the activity is safe for you, and to adjust your insulin, medicines, or food intake as needed. This information is not intended to replace advice given to you by your health care provider. Make sure you discuss any questions you have with your health care provider. Document Released: 10/16/2003 Document Revised: 02/03/2017 Document Reviewed: 01/05/2016 Elsevier Interactive Patient Education  2019 Elsevier Inc.  

## 2018-08-22 LAB — LIPID PANEL
CHOLESTEROL TOTAL: 150 mg/dL (ref 100–199)
Chol/HDL Ratio: 3 ratio (ref 0.0–4.4)
HDL: 50 mg/dL (ref >39)
LDL Calculated: 74 mg/dL (ref 0–99)
TRIGLYCERIDES: 128 mg/dL (ref 0–149)
VLDL Cholesterol Cal: 26 mg/dL (ref 5–40)

## 2018-08-22 LAB — CMP14+EGFR
A/G RATIO: 1.4 (ref 1.2–2.2)
ALK PHOS: 85 IU/L (ref 39–117)
ALT: 10 IU/L (ref 0–32)
AST: 10 IU/L (ref 0–40)
Albumin: 4.2 g/dL (ref 3.6–4.8)
BILIRUBIN TOTAL: 0.5 mg/dL (ref 0.0–1.2)
BUN/Creatinine Ratio: 12 (ref 12–28)
BUN: 19 mg/dL (ref 8–27)
CALCIUM: 10 mg/dL (ref 8.7–10.3)
CHLORIDE: 106 mmol/L (ref 96–106)
CO2: 20 mmol/L (ref 20–29)
Creatinine, Ser: 1.62 mg/dL — ABNORMAL HIGH (ref 0.57–1.00)
GFR calc Af Amer: 39 mL/min/{1.73_m2} — ABNORMAL LOW (ref >59)
GFR calc non Af Amer: 34 mL/min/{1.73_m2} — ABNORMAL LOW (ref >59)
Globulin, Total: 3.1 g/dL (ref 1.5–4.5)
Glucose: 157 mg/dL — ABNORMAL HIGH (ref 65–99)
POTASSIUM: 4.5 mmol/L (ref 3.5–5.2)
Sodium: 140 mmol/L (ref 134–144)
Total Protein: 7.3 g/dL (ref 6.0–8.5)

## 2018-08-22 LAB — HEPATITIS C ANTIBODY

## 2018-08-22 NOTE — Progress Notes (Signed)
YOU ARE NEG FOR HEPATITIS C VIRUS. YOUR LIVER AND KIDNEY FXN ARE STABLE. YOUR CHOL LOOKS GREAT.

## 2018-08-25 ENCOUNTER — Encounter: Payer: Self-pay | Admitting: Internal Medicine

## 2018-08-30 DIAGNOSIS — E1129 Type 2 diabetes mellitus with other diabetic kidney complication: Secondary | ICD-10-CM | POA: Diagnosis not present

## 2018-08-30 DIAGNOSIS — E785 Hyperlipidemia, unspecified: Secondary | ICD-10-CM | POA: Diagnosis not present

## 2018-08-30 DIAGNOSIS — I129 Hypertensive chronic kidney disease with stage 1 through stage 4 chronic kidney disease, or unspecified chronic kidney disease: Secondary | ICD-10-CM | POA: Diagnosis not present

## 2018-08-30 DIAGNOSIS — Z940 Kidney transplant status: Secondary | ICD-10-CM | POA: Diagnosis not present

## 2018-08-30 DIAGNOSIS — N189 Chronic kidney disease, unspecified: Secondary | ICD-10-CM | POA: Diagnosis not present

## 2018-10-02 DIAGNOSIS — Z5181 Encounter for therapeutic drug level monitoring: Secondary | ICD-10-CM | POA: Diagnosis not present

## 2018-10-02 DIAGNOSIS — Z940 Kidney transplant status: Secondary | ICD-10-CM | POA: Diagnosis not present

## 2018-10-02 DIAGNOSIS — Z79899 Other long term (current) drug therapy: Secondary | ICD-10-CM | POA: Diagnosis not present

## 2018-10-02 DIAGNOSIS — B351 Tinea unguium: Secondary | ICD-10-CM | POA: Diagnosis not present

## 2018-10-03 DIAGNOSIS — Z940 Kidney transplant status: Secondary | ICD-10-CM | POA: Diagnosis not present

## 2018-11-03 DIAGNOSIS — E1129 Type 2 diabetes mellitus with other diabetic kidney complication: Secondary | ICD-10-CM | POA: Diagnosis not present

## 2018-11-03 DIAGNOSIS — N189 Chronic kidney disease, unspecified: Secondary | ICD-10-CM | POA: Diagnosis not present

## 2018-11-03 DIAGNOSIS — I129 Hypertensive chronic kidney disease with stage 1 through stage 4 chronic kidney disease, or unspecified chronic kidney disease: Secondary | ICD-10-CM | POA: Diagnosis not present

## 2018-11-03 DIAGNOSIS — E785 Hyperlipidemia, unspecified: Secondary | ICD-10-CM | POA: Diagnosis not present

## 2018-11-03 DIAGNOSIS — Z940 Kidney transplant status: Secondary | ICD-10-CM | POA: Diagnosis not present

## 2018-11-13 DIAGNOSIS — E039 Hypothyroidism, unspecified: Secondary | ICD-10-CM | POA: Diagnosis not present

## 2018-11-13 DIAGNOSIS — E7800 Pure hypercholesterolemia, unspecified: Secondary | ICD-10-CM | POA: Diagnosis not present

## 2018-11-13 DIAGNOSIS — E1165 Type 2 diabetes mellitus with hyperglycemia: Secondary | ICD-10-CM | POA: Diagnosis not present

## 2018-11-20 DIAGNOSIS — E7800 Pure hypercholesterolemia, unspecified: Secondary | ICD-10-CM | POA: Diagnosis not present

## 2018-11-20 DIAGNOSIS — E1165 Type 2 diabetes mellitus with hyperglycemia: Secondary | ICD-10-CM | POA: Diagnosis not present

## 2018-11-20 DIAGNOSIS — E039 Hypothyroidism, unspecified: Secondary | ICD-10-CM | POA: Diagnosis not present

## 2018-11-20 DIAGNOSIS — I10 Essential (primary) hypertension: Secondary | ICD-10-CM | POA: Diagnosis not present

## 2018-11-20 DIAGNOSIS — Z940 Kidney transplant status: Secondary | ICD-10-CM | POA: Diagnosis not present

## 2018-12-16 ENCOUNTER — Other Ambulatory Visit: Payer: Self-pay | Admitting: Internal Medicine

## 2018-12-25 DIAGNOSIS — B351 Tinea unguium: Secondary | ICD-10-CM | POA: Diagnosis not present

## 2019-01-04 DIAGNOSIS — E1129 Type 2 diabetes mellitus with other diabetic kidney complication: Secondary | ICD-10-CM | POA: Diagnosis not present

## 2019-01-04 DIAGNOSIS — N189 Chronic kidney disease, unspecified: Secondary | ICD-10-CM | POA: Diagnosis not present

## 2019-01-04 DIAGNOSIS — I129 Hypertensive chronic kidney disease with stage 1 through stage 4 chronic kidney disease, or unspecified chronic kidney disease: Secondary | ICD-10-CM | POA: Diagnosis not present

## 2019-01-04 DIAGNOSIS — Z940 Kidney transplant status: Secondary | ICD-10-CM | POA: Diagnosis not present

## 2019-01-30 ENCOUNTER — Other Ambulatory Visit: Payer: Self-pay | Admitting: Internal Medicine

## 2019-01-30 DIAGNOSIS — H43391 Other vitreous opacities, right eye: Secondary | ICD-10-CM | POA: Diagnosis not present

## 2019-01-30 DIAGNOSIS — H43813 Vitreous degeneration, bilateral: Secondary | ICD-10-CM | POA: Diagnosis not present

## 2019-01-30 DIAGNOSIS — H2513 Age-related nuclear cataract, bilateral: Secondary | ICD-10-CM | POA: Diagnosis not present

## 2019-01-30 DIAGNOSIS — E119 Type 2 diabetes mellitus without complications: Secondary | ICD-10-CM | POA: Diagnosis not present

## 2019-01-30 DIAGNOSIS — Z20822 Contact with and (suspected) exposure to covid-19: Secondary | ICD-10-CM

## 2019-01-30 DIAGNOSIS — R6889 Other general symptoms and signs: Secondary | ICD-10-CM | POA: Diagnosis not present

## 2019-01-30 DIAGNOSIS — H25041 Posterior subcapsular polar age-related cataract, right eye: Secondary | ICD-10-CM | POA: Diagnosis not present

## 2019-01-30 LAB — HM DIABETES EYE EXAM

## 2019-02-02 LAB — NOVEL CORONAVIRUS, NAA: SARS-CoV-2, NAA: NOT DETECTED

## 2019-02-14 DIAGNOSIS — H25813 Combined forms of age-related cataract, bilateral: Secondary | ICD-10-CM | POA: Diagnosis not present

## 2019-02-14 DIAGNOSIS — H43811 Vitreous degeneration, right eye: Secondary | ICD-10-CM | POA: Diagnosis not present

## 2019-02-19 DIAGNOSIS — D631 Anemia in chronic kidney disease: Secondary | ICD-10-CM | POA: Diagnosis not present

## 2019-02-19 DIAGNOSIS — E1122 Type 2 diabetes mellitus with diabetic chronic kidney disease: Secondary | ICD-10-CM | POA: Diagnosis not present

## 2019-02-19 DIAGNOSIS — T829XXA Unspecified complication of cardiac and vascular prosthetic device, implant and graft, initial encounter: Secondary | ICD-10-CM | POA: Diagnosis not present

## 2019-02-19 DIAGNOSIS — I129 Hypertensive chronic kidney disease with stage 1 through stage 4 chronic kidney disease, or unspecified chronic kidney disease: Secondary | ICD-10-CM | POA: Diagnosis not present

## 2019-02-19 DIAGNOSIS — N2581 Secondary hyperparathyroidism of renal origin: Secondary | ICD-10-CM | POA: Diagnosis not present

## 2019-02-19 DIAGNOSIS — E785 Hyperlipidemia, unspecified: Secondary | ICD-10-CM | POA: Diagnosis not present

## 2019-02-19 DIAGNOSIS — N189 Chronic kidney disease, unspecified: Secondary | ICD-10-CM | POA: Diagnosis not present

## 2019-02-19 DIAGNOSIS — Z940 Kidney transplant status: Secondary | ICD-10-CM | POA: Diagnosis not present

## 2019-02-19 DIAGNOSIS — D899 Disorder involving the immune mechanism, unspecified: Secondary | ICD-10-CM | POA: Diagnosis not present

## 2019-02-19 DIAGNOSIS — N183 Chronic kidney disease, stage 3 (moderate): Secondary | ICD-10-CM | POA: Diagnosis not present

## 2019-02-19 DIAGNOSIS — E1129 Type 2 diabetes mellitus with other diabetic kidney complication: Secondary | ICD-10-CM | POA: Diagnosis not present

## 2019-02-19 DIAGNOSIS — Z1159 Encounter for screening for other viral diseases: Secondary | ICD-10-CM | POA: Diagnosis not present

## 2019-02-23 ENCOUNTER — Encounter: Payer: Self-pay | Admitting: Internal Medicine

## 2019-03-08 DIAGNOSIS — H25041 Posterior subcapsular polar age-related cataract, right eye: Secondary | ICD-10-CM | POA: Diagnosis not present

## 2019-03-08 DIAGNOSIS — H268 Other specified cataract: Secondary | ICD-10-CM | POA: Diagnosis not present

## 2019-03-21 ENCOUNTER — Ambulatory Visit: Payer: Medicare Other | Admitting: Internal Medicine

## 2019-03-21 ENCOUNTER — Ambulatory Visit: Payer: Medicare Other

## 2019-03-22 ENCOUNTER — Telehealth: Payer: Self-pay | Admitting: Internal Medicine

## 2019-03-22 NOTE — Chronic Care Management (AMB) (Signed)
°  Chronic Care Management   Outreach Note  03/22/2019 Name: [name] [name] MRN: [id number] DOB: [birth year]  Referred by: Glendale Chard, MD Reason for referral : Chronic Care Management (Initial CCM outreach was unsuccessful.)   An unsuccessful telephone outreach was attempted today. The patient was referred to the case management team by for assistance with chronic care management and care coordination.   Follow Up Plan: A HIPPA compliant phone message was left for the patient providing contact information and requesting a return call.  The care management team will reach out to the patient again over the next 7 days.  If patient returns call to provider office, please advise to call [facility] at [place]  [email or web address]   ??[phone]

## 2019-03-27 NOTE — Chronic Care Management (AMB) (Signed)
°  Chronic Care Management   Outreach Note  03/27/2019 Name: [name] [name] MRN: [id number] DOB: 1955/09/29  Referred by: Glendale Chard, MD Reason for referral : Chronic Care Management (Initial CCM outreach was unsuccessful.) and Chronic Care Management (Second CCM outreach was unsuccessful. )   A second unsuccessful telephone outreach was attempted today. The patient was referred to the case management team for assistance with chronic care management and care coordination.   Follow Up Plan: A HIPPA compliant phone message was left for the patient providing contact information and requesting a return call.  The care management team will reach out to the patient again over the next 7 days.  If patient returns call to provider office, please advise to call [place] at [place]  [email or web address]   ??[phone]

## 2019-03-30 NOTE — Chronic Care Management (AMB) (Signed)
°  Chronic Care Management   Outreach Note  03/30/2019 Name: [name] [name] MRN: [id number] DOB: February 24, 1956  Referred by: Glendale Chard, MD Reason for referral : Chronic Care Management (Initial CCM outreach was unsuccessful.), Chronic Care Management (Second CCM outreach was unsuccessful. ), and Chronic Care Management (Third CCM outreach was unsuccessful)   Third unsuccessful telephone outreach was attempted today. The patient was referred to the case management team for assistance with chronic care management and care coordination. The patient's primary care provider has been notified of our unsuccessful attempts to make or maintain contact with the patient. The care management team is pleased to engage with this patient at any time in the future should he/she be interested in assistance from the care management team.   Follow Up Plan: The care management team is available to follow up with the patient after provider conversation with the patient regarding recommendation for care management engagement and subsequent re-referral to the care management team.   [place]  [email or web address]   ??[phone]

## 2019-04-02 ENCOUNTER — Encounter: Payer: Self-pay | Admitting: Podiatry

## 2019-04-02 ENCOUNTER — Ambulatory Visit: Payer: Medicare HMO | Admitting: Podiatry

## 2019-04-02 ENCOUNTER — Other Ambulatory Visit: Payer: Self-pay

## 2019-04-02 VITALS — BP 160/79 | HR 67 | Temp 97.3°F

## 2019-04-02 DIAGNOSIS — N185 Chronic kidney disease, stage 5: Secondary | ICD-10-CM

## 2019-04-02 DIAGNOSIS — E1122 Type 2 diabetes mellitus with diabetic chronic kidney disease: Secondary | ICD-10-CM

## 2019-04-02 DIAGNOSIS — B351 Tinea unguium: Secondary | ICD-10-CM

## 2019-04-02 DIAGNOSIS — M79675 Pain in left toe(s): Secondary | ICD-10-CM

## 2019-04-02 DIAGNOSIS — M79674 Pain in right toe(s): Secondary | ICD-10-CM

## 2019-04-02 DIAGNOSIS — Z794 Long term (current) use of insulin: Secondary | ICD-10-CM | POA: Diagnosis not present

## 2019-04-02 MED ORDER — NONFORMULARY OR COMPOUNDED ITEM: 3 refills | Status: DC

## 2019-04-02 NOTE — Patient Instructions (Signed)
Diabetes Mellitus and Foot Care Foot care is an important part of your health, especially when you have diabetes. Diabetes may cause you to have problems because of poor blood flow (circulation) to your feet and legs, which can cause your skin to:  Become thinner and drier.  Break more easily.  Heal more slowly.  Peel and crack. You may also have nerve damage (neuropathy) in your legs and feet, causing decreased feeling in them. This means that you may not notice minor injuries to your feet that could lead to more serious problems. Noticing and addressing any potential problems early is the best way to prevent future foot problems. How to care for your feet Foot hygiene  Wash your feet daily with warm water and mild soap. Do not use hot water. Then, pat your feet and the areas between your toes until they are completely dry. Do not soak your feet as this can dry your skin.  Trim your toenails straight across. Do not dig under them or around the cuticle. File the edges of your nails with an emery board or nail file.  Apply a moisturizing lotion or petroleum jelly to the skin on your feet and to dry, brittle toenails. Use lotion that does not contain alcohol and is unscented. Do not apply lotion between your toes. Shoes and socks  Wear clean socks or stockings every day. Make sure they are not too tight. Do not wear knee-high stockings since they may decrease blood flow to your legs.  Wear shoes that fit properly and have enough cushioning. Always look in your shoes before you put them on to be sure there are no objects inside.  To break in new shoes, wear them for just a few hours a day. This prevents injuries on your feet. Wounds, scrapes, corns, and calluses  Check your feet daily for blisters, cuts, bruises, sores, and redness. If you cannot see the bottom of your feet, use a mirror or ask someone for help.  Do not cut corns or calluses or try to remove them with medicine.  If you  find a minor scrape, cut, or break in the skin on your feet, keep it and the skin around it clean and dry. You may clean these areas with mild soap and water. Do not clean the area with peroxide, alcohol, or iodine.  If you have a wound, scrape, corn, or callus on your foot, look at it several times a day to make sure it is healing and not infected. Check for: ? Redness, swelling, or pain. ? Fluid or blood. ? Warmth. ? Pus or a bad smell. General instructions  Do not cross your legs. This may decrease blood flow to your feet.  Do not use heating pads or hot water bottles on your feet. They may burn your skin. If you have lost feeling in your feet or legs, you may not know this is happening until it is too late.  Protect your feet from hot and cold by wearing shoes, such as at the beach or on hot pavement.  Schedule a complete foot exam at least once a year (annually) or more often if you have foot problems. If you have foot problems, report any cuts, sores, or bruises to your health care provider immediately. Contact a health care provider if:  You have a medical condition that increases your risk of infection and you have any cuts, sores, or bruises on your feet.  You have an injury that is not   healing.  You have redness on your legs or feet.  You feel burning or tingling in your legs or feet.  You have pain or cramps in your legs and feet.  Your legs or feet are numb.  Your feet always feel cold.  You have pain around a toenail. Get help right away if:  You have a wound, scrape, corn, or callus on your foot and: ? You have pain, swelling, or redness that gets worse. ? You have fluid or blood coming from the wound, scrape, corn, or callus. ? Your wound, scrape, corn, or callus feels warm to the touch. ? You have pus or a bad smell coming from the wound, scrape, corn, or callus. ? You have a fever. ? You have a red line going up your leg. Summary  Check your feet every day  for cuts, sores, red spots, swelling, and blisters.  Moisturize feet and legs daily.  Wear shoes that fit properly and have enough cushioning.  If you have foot problems, report any cuts, sores, or bruises to your health care provider immediately.  Schedule a complete foot exam at least once a year (annually) or more often if you have foot problems. This information is not intended to replace advice given to you by your health care provider. Make sure you discuss any questions you have with your health care provider. Document Released: 07/23/2000 Document Revised: 09/07/2017 Document Reviewed: 08/27/2016 Elsevier Patient Education  2020 Elsevier Inc.   Onychomycosis/Fungal Toenails  WHAT IS IT? An infection that lies within the keratin of your nail plate that is caused by a fungus.  WHY ME? Fungal infections affect all ages, sexes, races, and creeds.  There may be many factors that predispose you to a fungal infection such as age, coexisting medical conditions such as diabetes, or an autoimmune disease; stress, medications, fatigue, genetics, etc.  Bottom line: fungus thrives in a warm, moist environment and your shoes offer such a location.  IS IT CONTAGIOUS? Theoretically, yes.  You do not want to share shoes, nail clippers or files with someone who has fungal toenails.  Walking around barefoot in the same room or sleeping in the same bed is unlikely to transfer the organism.  It is important to realize, however, that fungus can spread easily from one nail to the next on the same foot.  HOW DO WE TREAT THIS?  There are several ways to treat this condition.  Treatment may depend on many factors such as age, medications, pregnancy, liver and kidney conditions, etc.  It is best to ask your doctor which options are available to you.  1. No treatment.   Unlike many other medical concerns, you can live with this condition.  However for many people this can be a painful condition and may lead to  ingrown toenails or a bacterial infection.  It is recommended that you keep the nails cut short to help reduce the amount of fungal nail. 2. Topical treatment.  These range from herbal remedies to prescription strength nail lacquers.  About 40-50% effective, topicals require twice daily application for approximately 9 to 12 months or until an entirely new nail has grown out.  The most effective topicals are medical grade medications available through physicians offices. 3. Oral antifungal medications.  With an 80-90% cure rate, the most common oral medication requires 3 to 4 months of therapy and stays in your system for a year as the new nail grows out.  Oral antifungal medications do require   blood work to make sure it is a safe drug for you.  A liver function panel will be performed prior to starting the medication and after the first month of treatment.  It is important to have the blood work performed to avoid any harmful side effects.  In general, this medication safe but blood work is required. 4. Laser Therapy.  This treatment is performed by applying a specialized laser to the affected nail plate.  This therapy is noninvasive, fast, and non-painful.  It is not covered by insurance and is therefore, out of pocket.  The results have been very good with a 80-95% cure rate.  The [facility] is the only practice in the area to offer this therapy. 5. Permanent Nail Avulsion.  Removing the entire nail so that a new nail will not grow back. 

## 2019-04-04 ENCOUNTER — Ambulatory Visit: Payer: Self-pay | Admitting: Internal Medicine

## 2019-04-04 ENCOUNTER — Ambulatory Visit: Payer: Medicare HMO

## 2019-04-05 DIAGNOSIS — K219 Gastro-esophageal reflux disease without esophagitis: Secondary | ICD-10-CM | POA: Diagnosis not present

## 2019-04-05 DIAGNOSIS — Z4822 Encounter for aftercare following kidney transplant: Secondary | ICD-10-CM | POA: Diagnosis not present

## 2019-04-05 DIAGNOSIS — Z23 Encounter for immunization: Secondary | ICD-10-CM | POA: Diagnosis not present

## 2019-04-05 DIAGNOSIS — E119 Type 2 diabetes mellitus without complications: Secondary | ICD-10-CM | POA: Diagnosis not present

## 2019-04-05 DIAGNOSIS — R197 Diarrhea, unspecified: Secondary | ICD-10-CM | POA: Diagnosis not present

## 2019-04-05 DIAGNOSIS — E1129 Type 2 diabetes mellitus with other diabetic kidney complication: Secondary | ICD-10-CM | POA: Diagnosis not present

## 2019-04-05 DIAGNOSIS — Z940 Kidney transplant status: Secondary | ICD-10-CM | POA: Diagnosis not present

## 2019-04-05 DIAGNOSIS — N2581 Secondary hyperparathyroidism of renal origin: Secondary | ICD-10-CM | POA: Diagnosis not present

## 2019-04-05 DIAGNOSIS — I10 Essential (primary) hypertension: Secondary | ICD-10-CM | POA: Diagnosis not present

## 2019-04-05 DIAGNOSIS — Z79899 Other long term (current) drug therapy: Secondary | ICD-10-CM | POA: Diagnosis not present

## 2019-04-05 DIAGNOSIS — M7989 Other specified soft tissue disorders: Secondary | ICD-10-CM | POA: Diagnosis not present

## 2019-04-05 DIAGNOSIS — Z794 Long term (current) use of insulin: Secondary | ICD-10-CM | POA: Diagnosis not present

## 2019-04-10 NOTE — Progress Notes (Signed)
Subjective: [name] [name] presents today referred by Glendale Chard, MD for diabetic foot evaluation.  Patient relates greater than 10 year history of diabetes.  Patient denies any history of foot wounds.  Patient denies any history of numbness, tingling, burning, pins/needles sensations.  Today, patient c/o of painful, discolored, thick toenails which interfere with daily activities.  Pain is aggravated when wearing enclosed shoe gear.   She is s/p her second kidney transplant which was in 2016. She states she is doing well.  She states she is taking Lamisil for her fingernails.  Past Medical History:  Diagnosis Date  . Blood transfusion   . Chronic kidney disease    s/p transplant, kidney wasn't removed  . Gout due to renal impairment 2010   "before finding out I had kidney failure"  . History of renal transplant   . Hyperlipidemia   . Hypertension   . Sickle cell trait ([place])   . Thyroid disease   . Type II diabetes mellitus ([place])   . UTI (lower urinary tract infection) 12/08/11   "first one ever"    Patient Active Problem List   Diagnosis Date Noted  . DM (diabetes mellitus), type 2 with renal complications ([place]) 08/11/7251  . Norovirus 07/17/2015  . Hypercalcemia associated with chronic dialysis 05/06/2015  . Hypercholesteremia 05/06/2015  . Obesity, morbid, BMI 40.0-49.9 ([place]) 05/06/2015  . Secondary renal hyperparathyroidism ([place]) 05/06/2015  . Essential hypertension with goal blood pressure less than 140/90 04/24/2015  . Immunosuppression ([place]) 04/24/2015  . Immunosuppressive management encounter following kidney transplant 01/28/2012  . CRF (chronic renal failure) 12/08/2011  . History of renal transplant 12/08/2011  . Immunocompromised ([place]) 12/08/2011  . END STAGE RENAL DISEASE 10/02/2008    Past Surgical History:  Procedure Laterality Date  . BREAST BIOPSY Left 08/07/2010   benign stereo  . kidney transplant    . PARATHYROIDECTOMY    . TUBAL  LIGATION  1980's  . VAGINAL HYSTERECTOMY  2006   Name Dose, Frequency   ACE Inhibitors lisinopril (PRINIVIL,ZESTRIL) 10 MG tablet    Antacids - Magnesium Salts magnesium oxide (MAG-OX) 400 MG tablet    Antifungals terbinafine (LAMISIL) 250 MG tablet 250 mg, Daily    Antifungals - Topical clotrimazole (LOTRIMIN) 1 % cream    Antiperistaltic Agents loperamide (IMODIUM) 2 MG capsule As needed    Beta Blockers Cardio-Selective metoprolol (LOPRESSOR) 50 MG tablet 3 times daily    Calcium Channel Blockers amLODipine (NORVASC) 5 MG tablet 5 mg, Daily    Diagnostic Tests glucose blood (ONETOUCH VERIO) test strip    Glucocorticosteroids predniSONE (DELTASONE) 5 MG tablet 5 mg, Daily with breakfast    HMG CoA Reductase Inhibitors simvastatin (ZOCOR) 10 MG tablet    Immunosuppressive Agents mycophenolate (MYFORTIC) 180 MG EC tablet 180 mg, 2 times daily    tacrolimus (PROGRAF) 1 MG capsule    Insulin Insulin Aspart (NOVOLOG FLEXPEN [place]) 4 Units, 3 times daily    Insulin Detemir (LEVEMIR FLEXTOUCH) 100 UNIT/ML Pen    Metabolic Modifiers cinacalcet (SENSIPAR) 30 MG tablet 30 mg, Daily    Nasal Steroids mometasone (NASONEX) 50 MCG/ACT nasal spray 2 spray, Daily   Patient Sig: Place 2 sprays into the nose daily.   Ordered on: 10/11/2014   Authorized by: Orpah Greek   Dispense: 17 g   Refills: 12 ordered  Ophthalmic Anti-infectives ofloxacin (OCUFLOX) 0.3 % ophthalmic solution    Ophthalmic Steroids prednisoLONE acetate (PRED FORTE) 1 % ophthalmic suspension    Ophthalmics - Misc. ketorolac (  ACULAR) 0.5 % ophthalmic solution    Proton Pump Inhibitors omeprazole (PRILOSEC) 20 MG capsule 20 mg, Daily    Salicylates aspirin EC 81 MG tablet 81 mg, Daily    Thyroid Hormones levothyroxine (SYNTHROID) 75 MCG tablet    levothyroxine (SYNTHROID, LEVOTHROID) 25 MCG tablet 25 mcg, Daily    None   Allergies  Allergen Reactions  . Codeine Hives  . Acetaminophen-Codeine  Rash    Social History   Occupational History  . Not on file  Tobacco Use  . Smoking status: Former Smoker    Packs/day: 0.50    Years: 10.00    Pack years: 5.00    Types: Cigarettes    Quit date: 11/07/1988    Years since quitting: 30.4  . Smokeless tobacco: Never Used  Substance and Sexual Activity  . Alcohol use: No  . Drug use: No  . Sexual activity: Yes    Family History  Problem Relation Age of Onset  . Heart disease Father   . Hypertension Mother   . Breast cancer Cousin   . Hypertension Sister   . Colon cancer Neg Hx   . Esophageal cancer Neg Hx   . Rectal cancer Neg Hx   . Stomach cancer Neg Hx     Immunization History  Administered Date(s) Administered  . Influenza-Unspecified 05/24/2015, 05/09/2018    Review of systems: Positive Findings in bold print.  Constitutional:  chills, fatigue, fever, sweats, weight change Communication: Optometrist, sign Ecologist, hand writing, iPad/Android device Head: headaches, head injury Eyes: changes in vision, eye pain, glaucoma, cataracts, macular degeneration, diplopia, glare,  light sensitivity, eyeglasses or contacts, blindness Ears nose mouth throat: hearing impaired, hearing aids,  ringing in ears, deaf, sign language,  vertigo, nosebleeds,  rhinitis,  cold sores, snoring, swollen glands Cardiovascular: HTN, edema, arrhythmia, pacemaker in place, defibrillator in place, chest pain/tightness, chronic anticoagulation, blood clot, heart failure, MI Peripheral Vascular: leg cramps, varicose veins, blood clots, lymphedema, varicosities Respiratory:  difficulty breathing, denies congestion, SOB, wheezing, cough, emphysema Gastrointestinal: change in appetite or weight, abdominal pain, constipation, diarrhea, nausea, vomiting, vomiting blood, change in bowel habits, abdominal pain, jaundice, rectal bleeding, hemorrhoids, GERD Genitourinary:  nocturia,  pain on urination, polyuria,  blood in urine, Foley catheter,  urinary urgency, ESRD on hemodialysis Musculoskeletal: amputation, cramping, stiff joints, painful joints, decreased joint motion, fractures, OA, gout, hemiplegia, paraplegia, uses cane, wheelchair bound, uses walker, uses rollator Skin: +changes in toenails, color change, dryness, itching, mole changes,  rash, wound(s) Neurological: headaches, numbness in feet, paresthesias in feet, burning in feet, fainting,  seizures, change in speech,  headaches, memory problems/poor historian, cerebral palsy, weakness, paralysis, CVA, TIA Endocrine: diabetes, hypothyroidism, hyperthyroidism,  goiter, dry mouth, flushing, heat intolerance,  cold intolerance,  excessive thirst, denies polyuria,  nocturia Hematological:  easy bleeding, excessive bleeding, easy bruising, enlarged lymph nodes, on long term blood thinner, history of past transusions Allergy/immunological:  hives, eczema, frequent infections, multiple drug allergies, seasonal allergies, transplant recipient, multiple food allergies Psychiatric:  anxiety, depression, mood disorder, suicidal ideations, hallucinations, insomnia  Objective: Vitals:   04/02/19 1441  BP: (!) 160/79  Pulse: 67  Temp: (!) 97.3 F (36.3 C)   Vascular Examination: Capillary refill time <3 seconds x 10 digits.  Dorsalis pedis pulses 2/4 b/l.  Posterior tibial pulses 2/4 b/l.  Digital hair absent x 10 digits.  Skin temperature gradient WNL b/l.  Dermatological Examination: Skin with normal turgor, texture and tone b/l.  Toenails b/l great toes discolored, thick, dystrophic  with subungual debris and pain with palpation to nailbeds due to thickness of nails.  Toenails 2-5 b/l elongated and nondystrophic.   Musculoskeletal: Muscle strength 5/5 to all LE muscle groups.  Neurological: Sensation intact 5/5 b/l with 10 gram monofilament.  Vibratory sensation intact b/l.  Assessment: 1. Painful onychomycosis toenails b/l great  toes 2. NIDDM  Plan: 1. Discussed diabetic foot care principles. Literature dispensed on today. 2. Toenails b/l great toes were debrided in length and girth without iatrogenic bleeding. To assist with the dystrophic great toenails,  Rx written for nonformulary compounding topical antifungal: [facility]: Antifungal cream - Terbinafine 3%, Fluconazole 2%, Tea Tree Oil 5%, Urea 10%, Ibuprofen 2% in DMSO Suspension #84ml. Apply to the affected nail(s) at bedtime. Nondystrophic nails were trimmed. 3. Patient to continue soft, supportive shoe gear 4. Patient to report any pedal injuries to medical professional immediately. 5. Follow up 3 months.  6. Patient/POA to call should there be a concern in the interim.

## 2019-04-18 ENCOUNTER — Other Ambulatory Visit: Payer: Self-pay | Admitting: Internal Medicine

## 2019-04-18 DIAGNOSIS — Z1231 Encounter for screening mammogram for malignant neoplasm of breast: Secondary | ICD-10-CM

## 2019-04-19 ENCOUNTER — Ambulatory Visit (INDEPENDENT_AMBULATORY_CARE_PROVIDER_SITE_OTHER): Payer: Medicare HMO | Admitting: Internal Medicine

## 2019-04-19 ENCOUNTER — Other Ambulatory Visit: Payer: Self-pay

## 2019-04-19 ENCOUNTER — Ambulatory Visit (INDEPENDENT_AMBULATORY_CARE_PROVIDER_SITE_OTHER): Payer: Medicare HMO

## 2019-04-19 ENCOUNTER — Encounter: Payer: Self-pay | Admitting: Internal Medicine

## 2019-04-19 VITALS — BP 140/80 | HR 68 | Temp 98.6°F | Ht 59.5 in | Wt 224.2 lb

## 2019-04-19 VITALS — BP 140/80 | HR 68 | Temp 98.6°F | Ht 59.9 in | Wt 224.2 lb

## 2019-04-19 DIAGNOSIS — E038 Other specified hypothyroidism: Secondary | ICD-10-CM | POA: Diagnosis not present

## 2019-04-19 DIAGNOSIS — I129 Hypertensive chronic kidney disease with stage 1 through stage 4 chronic kidney disease, or unspecified chronic kidney disease: Secondary | ICD-10-CM | POA: Diagnosis not present

## 2019-04-19 DIAGNOSIS — E1122 Type 2 diabetes mellitus with diabetic chronic kidney disease: Secondary | ICD-10-CM

## 2019-04-19 DIAGNOSIS — D899 Disorder involving the immune mechanism, unspecified: Secondary | ICD-10-CM

## 2019-04-19 DIAGNOSIS — Z6841 Body Mass Index (BMI) 40.0 and over, adult: Secondary | ICD-10-CM | POA: Diagnosis not present

## 2019-04-19 DIAGNOSIS — N2581 Secondary hyperparathyroidism of renal origin: Secondary | ICD-10-CM

## 2019-04-19 DIAGNOSIS — Z940 Kidney transplant status: Secondary | ICD-10-CM | POA: Diagnosis not present

## 2019-04-19 DIAGNOSIS — Z0000 Encounter for general adult medical examination without abnormal findings: Secondary | ICD-10-CM

## 2019-04-19 DIAGNOSIS — N183 Chronic kidney disease, stage 3 (moderate): Secondary | ICD-10-CM | POA: Diagnosis not present

## 2019-04-19 DIAGNOSIS — D849 Immunodeficiency, unspecified: Secondary | ICD-10-CM

## 2019-04-19 DIAGNOSIS — Z794 Long term (current) use of insulin: Secondary | ICD-10-CM

## 2019-04-19 NOTE — Patient Instructions (Signed)
Exercising to Stay Healthy To become healthy and stay healthy, it is recommended that you do moderate-intensity and vigorous-intensity exercise. You can tell that you are exercising at a moderate intensity if your heart starts beating faster and you start breathing faster but can still hold a conversation. You can tell that you are exercising at a vigorous intensity if you are breathing much harder and faster and cannot hold a conversation while exercising. Exercising regularly is important. It has many health benefits, such as:  Improving overall fitness, flexibility, and endurance.  Increasing bone density.  Helping with weight control.  Decreasing body fat.  Increasing muscle strength.  Reducing stress and tension.  Improving overall health. How often should I exercise? Choose an activity that you enjoy, and set realistic goals. Your health care provider can help you make an activity plan that works for you. Exercise regularly as told by your health care provider. This may include:  Doing strength training two times a week, such as: ? Lifting weights. ? Using resistance bands. ? Push-ups. ? Sit-ups. ? Yoga.  Doing a certain intensity of exercise for a given amount of time. Choose from these options: ? A total of 150 minutes of moderate-intensity exercise every week. ? A total of 75 minutes of vigorous-intensity exercise every week. ? A mix of moderate-intensity and vigorous-intensity exercise every week. Children, pregnant women, people who have not exercised regularly, people who are overweight, and older adults may need to talk with a health care provider about what activities are safe to do. If you have a medical condition, be sure to talk with your health care provider before you start a new exercise program. What are some exercise ideas? Moderate-intensity exercise ideas include:  Walking 1 mile (1.6 km) in about 15 minutes.  Biking.  Hiking.  Golfing.  Dancing.   Water aerobics. Vigorous-intensity exercise ideas include:  Walking 4.5 miles (7.2 km) or more in about 1 hour.  Jogging or running 5 miles (8 km) in about 1 hour.  Biking 10 miles (16.1 km) or more in about 1 hour.  Lap swimming.  Roller-skating or in-line skating.  Cross-country skiing.  Vigorous competitive sports, such as football, basketball, and soccer.  Jumping rope.  Aerobic dancing. What are some everyday activities that can help me to get exercise?  Yard work, such as: ? Pushing a lawn mower. ? Raking and bagging leaves.  Washing your car.  Pushing a stroller.  Shoveling snow.  Gardening.  Washing windows or floors. How can I be more active in my day-to-day activities?  Use stairs instead of an elevator.  Take a walk during your lunch break.  If you drive, park your car farther away from your work or school.  If you take public transportation, get off one stop early and walk the rest of the way.  Stand up or walk around during all of your indoor phone calls.  Get up, stretch, and walk around every 30 minutes throughout the day.  Enjoy exercise with a friend. Support to continue exercising will help you keep a regular routine of activity. What guidelines can I follow while exercising?  Before you start a new exercise program, talk with your health care provider.  Do not exercise so much that you hurt yourself, feel dizzy, or get very short of breath.  Wear comfortable clothes and wear shoes with good support.  Drink plenty of water while you exercise to prevent dehydration or heat stroke.  Work out until your breathing   and your heartbeat get faster. Where to find more information  [place] Department of Health and Human Services: www.hhs.gov  Centers for Disease Control and Prevention (CDC): www.cdc.gov Summary  Exercising regularly is important. It will improve your overall fitness, flexibility, and endurance.  Regular exercise also will  improve your overall health. It can help you control your weight, reduce stress, and improve your bone density.  Do not exercise so much that you hurt yourself, feel dizzy, or get very short of breath.  Before you start a new exercise program, talk with your health care provider. This information is not intended to replace advice given to you by your health care provider. Make sure you discuss any questions you have with your health care provider. Document Released: 08/28/2010 Document Revised: 07/08/2017 Document Reviewed: 06/16/2017 Elsevier Patient Education  2020 Elsevier Inc.  

## 2019-04-19 NOTE — Patient Instructions (Signed)
[name] [name] , Thank you for taking time to come for your Medicare Wellness Visit. I appreciate your ongoing commitment to your health goals. Please review the following plan we discussed and let me know if I can assist you in the future.   Screening recommendations/referrals: Colonoscopy: 01/27/2012 Mammogram: 03/28/18 Bone Density: n/a Recommended yearly ophthalmology/optometry visit for glaucoma screening and checkup Recommended yearly dental visit for hygiene and checkup  Vaccinations: Influenza vaccine: 03/2019 Pneumococcal vaccine: 08/2018 Tdap vaccine: declined Shingles vaccine: discussed    Advanced directives: Advance directive discussed with you today. I have provided a copy for you to complete at home and have notarized. Once this is complete please bring a copy in to our office so we can scan it into your chart.  Conditions/risks identified: obesity  Next appointment: 04/19/2019  Preventive Care 40-64 Years, Female Preventive care refers to lifestyle choices and visits with your health care provider that can promote health and wellness. What does preventive care include?  A yearly physical exam. This is also called an annual well check.  Dental exams once or twice a year.  Routine eye exams. Ask your health care provider how often you should have your eyes checked.  Personal lifestyle choices, including:  Daily care of your teeth and gums.  Regular physical activity.  Eating a healthy diet.  Avoiding tobacco and drug use.  Limiting alcohol use.  Practicing safe sex.  Taking low-dose aspirin daily starting at age 64.  Taking vitamin and mineral supplements as recommended by your health care provider. What happens during an annual well check? The services and screenings done by your health care provider during your annual well check will depend on your age, overall health, lifestyle risk factors, and family history of disease. Counseling  Your health care  provider may ask you questions about your:  Alcohol use.  Tobacco use.  Drug use.  Emotional well-being.  Home and relationship well-being.  Sexual activity.  Eating habits.  Work and work Statistician.  Method of birth control.  Menstrual cycle.  Pregnancy history. Screening  You may have the following tests or measurements:  Height, weight, and BMI.  Blood pressure.  Lipid and cholesterol levels. These may be checked every 5 years, or more frequently if you are over 32 years old.  Skin check.  Lung cancer screening. You may have this screening every year starting at age 79 if you have a 30-pack-year history of smoking and currently smoke or have quit within the past 15 years.  Fecal occult blood test (FOBT) of the stool. You may have this test every year starting at age 86.  Flexible sigmoidoscopy or colonoscopy. You may have a sigmoidoscopy every 5 years or a colonoscopy every 10 years starting at age 41.  Hepatitis C blood test.  Hepatitis B blood test.  Sexually transmitted disease (STD) testing.  Diabetes screening. This is done by checking your blood sugar (glucose) after you have not eaten for a while (fasting). You may have this done every 1-3 years.  Mammogram. This may be done every 1-2 years. Talk to your health care provider about when you should start having regular mammograms. This may depend on whether you have a family history of breast cancer.  BRCA-related cancer screening. This may be done if you have a family history of breast, ovarian, tubal, or peritoneal cancers.  Pelvic exam and Pap test. This may be done every 3 years starting at age 11. Starting at age 64, this may be done  every 5 years if you have a Pap test in combination with an HPV test.  Bone density scan. This is done to screen for osteoporosis. You may have this scan if you are at high risk for osteoporosis. Discuss your test results, treatment options, and if necessary, the need  for more tests with your health care provider. Vaccines  Your health care provider may recommend certain vaccines, such as:  Influenza vaccine. This is recommended every year.  Tetanus, diphtheria, and acellular pertussis (Tdap, Td) vaccine. You may need a Td booster every 10 years.  Zoster vaccine. You may need this after age 48.  Pneumococcal 13-valent conjugate (PCV13) vaccine. You may need this if you have certain conditions and were not previously vaccinated.  Pneumococcal polysaccharide (PPSV23) vaccine. You may need one or two doses if you smoke cigarettes or if you have certain conditions. Talk to your health care provider about which screenings and vaccines you need and how often you need them. This information is not intended to replace advice given to you by your health care provider. Make sure you discuss any questions you have with your health care provider. Document Released: 08/22/2015 Document Revised: 04/14/2016 Document Reviewed: 05/27/2015 Elsevier Interactive Patient Education  2017 [place] Prevention in the Home Falls can cause injuries. They can happen to people of all ages. There are many things you can do to make your home safe and to help prevent falls. What can I do on the outside of my home?  Regularly fix the edges of walkways and driveways and fix any cracks.  Remove anything that might make you trip as you walk through a door, such as a raised step or threshold.  Trim any bushes or trees on the path to your home.  Use bright outdoor lighting.  Clear any walking paths of anything that might make someone trip, such as rocks or tools.  Regularly check to see if handrails are loose or broken. Make sure that both sides of any steps have handrails.  Any raised decks and porches should have guardrails on the edges.  Have any leaves, snow, or ice cleared regularly.  Use sand or salt on walking paths during winter.  Clean up any spills in  your garage right away. This includes oil or grease spills. What can I do in the bathroom?  Use night lights.  Install grab bars by the toilet and in the tub and shower. Do not use towel bars as grab bars.  Use non-skid mats or decals in the tub or shower.  If you need to sit down in the shower, use a plastic, non-slip stool.  Keep the floor dry. Clean up any water that spills on the floor as soon as it happens.  Remove soap buildup in the tub or shower regularly.  Attach bath mats securely with double-sided non-slip rug tape.  Do not have throw rugs and other things on the floor that can make you trip. What can I do in the bedroom?  Use night lights.  Make sure that you have a light by your bed that is easy to reach.  Do not use any sheets or blankets that are too big for your bed. They should not hang down onto the floor.  Have a firm chair that has side arms. You can use this for support while you get dressed.  Do not have throw rugs and other things on the floor that can make you trip. What can  I do in the kitchen?  Clean up any spills right away.  Avoid walking on wet floors.  Keep items that you use a lot in easy-to-reach places.  If you need to reach something above you, use a strong step stool that has a grab bar.  Keep electrical cords out of the way.  Do not use floor polish or wax that makes floors slippery. If you must use wax, use non-skid floor wax.  Do not have throw rugs and other things on the floor that can make you trip. What can I do with my stairs?  Do not leave any items on the stairs.  Make sure that there are handrails on both sides of the stairs and use them. Fix handrails that are broken or loose. Make sure that handrails are as long as the stairways.  Check any carpeting to make sure that it is firmly attached to the stairs. Fix any carpet that is loose or worn.  Avoid having throw rugs at the top or bottom of the stairs. If you do have  throw rugs, attach them to the floor with carpet tape.  Make sure that you have a light switch at the top of the stairs and the bottom of the stairs. If you do not have them, ask someone to add them for you. What else can I do to help prevent falls?  Wear shoes that:  Do not have high heels.  Have rubber bottoms.  Are comfortable and fit you well.  Are closed at the toe. Do not wear sandals.  If you use a stepladder:  Make sure that it is fully opened. Do not climb a closed stepladder.  Make sure that both sides of the stepladder are locked into place.  Ask someone to hold it for you, if possible.  Clearly mark and make sure that you can see:  Any grab bars or handrails.  First and last steps.  Where the edge of each step is.  Use tools that help you move around (mobility aids) if they are needed. These include:  Canes.  Walkers.  Scooters.  Crutches.  Turn on the lights when you go into a dark area. Replace any light bulbs as soon as they burn out.  Set up your furniture so you have a clear path. Avoid moving your furniture around.  If any of your floors are uneven, fix them.  If there are any pets around you, be aware of where they are.  Review your medicines with your doctor. Some medicines can make you feel dizzy. This can increase your chance of falling. Ask your doctor what other things that you can do to help prevent falls. This information is not intended to replace advice given to you by your health care provider. Make sure you discuss any questions you have with your health care provider. Document Released: 05/22/2009 Document Revised: 01/01/2016 Document Reviewed: 08/30/2014 Elsevier Interactive Patient Education  2017 [employer].

## 2019-04-19 NOTE — Progress Notes (Signed)
Subjective:   [name] [name] is a 63 y.o. female who presents for Medicare Annual (Subsequent) preventive examination.  Review of Systems:  n/a Cardiac Risk Factors include: diabetes mellitus;dyslipidemia;hypertension;obesity (BMI >30kg/m2);sedentary lifestyle     Objective:     Vitals: BP 140/80 (BP Location: Left Arm, Patient Position: Sitting)   Pulse 68   Temp 98.6 F (37 C) (Oral)   Ht 4' 11.9" (1.521 m)   Wt 224 lb 3.2 oz (101.7 kg)   SpO2 96%   BMI 43.93 kg/m   Body mass index is 43.93 kg/m.  Advanced Directives 04/19/2019 11/16/2017 12/09/2015 10/28/2015 10/20/2014 12/08/2011 12/08/2011  Does Patient Have a Medical Advance Directive? No No No No No Patient does not have advance directive Patient does not have advance directive;Patient would like information  Would patient like information on creating a medical advance directive? Yes (MAU/Ambulatory/Procedural Areas - Information given) Yes (MAU/Ambulatory/Procedural Areas - Information given) - - No - patient declined information Advance directive packet given;Referral made to social work Referral made to social work    Tobacco Social History   Tobacco Use  Smoking Status Former Smoker  . Packs/day: 0.50  . Years: 10.00  . Pack years: 5.00  . Types: Cigarettes  . Quit date: 11/07/1988  . Years since quitting: 30.4  Smokeless Tobacco Never Used     Counseling given: Not Answered   Clinical Intake:  Pre-visit preparation completed: Yes  Pain : No/denies pain     Nutritional Status: BMI > 30  Obese Nutritional Risks: Nausea/ vomitting/ diarrhea(medication causes this, takes Imodium) Diabetes: Yes CBG done?: No CBG resulted in Enter/ Edit results?: No Did pt. bring in CBG monitor from home?: No  How often do you need to have someone help you when you read instructions, pamphlets, or other written materials from your doctor or pharmacy?: 1 - Never What is the last grade level you completed in school?: Master's  Degree  Interpreter Needed?: No  Information entered by :: CJohnson, LPN  Past Medical History:  Diagnosis Date  . Blood transfusion   . Chronic kidney disease    s/p transplant, kidney wasn't removed  . Gout due to renal impairment 2010   "before finding out I had kidney failure"  . History of renal transplant   . Hyperlipidemia   . Hypertension   . Sickle cell trait ([place])   . Thyroid disease   . Type II diabetes mellitus ([place])   . UTI (lower urinary tract infection) 12/08/11   "first one ever"   Past Surgical History:  Procedure Laterality Date  . BREAST BIOPSY Left 08/07/2010   benign stereo  . kidney transplant    . PARATHYROIDECTOMY    . TUBAL LIGATION  1980's  . VAGINAL HYSTERECTOMY  2006   Family History  Problem Relation Age of Onset  . Heart disease Father   . Hypertension Mother   . Breast cancer Cousin   . Hypertension Sister   . Colon cancer Neg Hx   . Esophageal cancer Neg Hx   . Rectal cancer Neg Hx   . Stomach cancer Neg Hx    Social History   Socioeconomic History  . Marital status: Married    Spouse name: Not on file  . Number of children: Not on file  . Years of education: Not on file  . Highest education level: Not on file  Occupational History  . Occupation: retired  Scientific laboratory technician  . Financial resource strain: Not hard at all  .  Food insecurity    Worry: Never true    Inability: Never true  . Transportation needs    Medical: No    Non-medical: No  Tobacco Use  . Smoking status: Former Smoker    Packs/day: 0.50    Years: 10.00    Pack years: 5.00    Types: Cigarettes    Quit date: 11/07/1988    Years since quitting: 30.4  . Smokeless tobacco: Never Used  Substance and Sexual Activity  . Alcohol use: No  . Drug use: No  . Sexual activity: Yes  Lifestyle  . Physical activity    Days per week: 0 days    Minutes per session: 0 min  . Stress: Not at all  Relationships  . Social Herbalist on phone: Not on file    Gets  together: Not on file    Attends religious service: Not on file    Active member of club or organization: Not on file    Attends meetings of clubs or organizations: Not on file    Relationship status: Not on file  Other Topics Concern  . Not on file  Social History Narrative  . Not on file    Outpatient Encounter Medications as of 04/19/2019  Medication Sig  . amLODipine (NORVASC) 5 MG tablet Take 5 mg by mouth daily.  Marland Kitchen aspirin EC 81 MG tablet Take 81 mg by mouth daily.  . cinacalcet (SENSIPAR) 30 MG tablet Take 30 mg by mouth daily.  . clotrimazole (LOTRIMIN) 1 % cream Twice daily to scaly areas on feet, toewebs, palms  . glucose blood (ONETOUCH VERIO) test strip USE TO TEST BLOOD SUGAR 4 TIMES DAILY AS DIRECTED  . Insulin Aspart (NOVOLOG FLEXPEN [place]) Inject 4 Units into the skin 3 (three) times daily. Sliding scale  . Insulin Detemir (LEVEMIR FLEXTOUCH) 100 UNIT/ML Pen 10 units in the morning and 40 units in the evening  . levothyroxine (SYNTHROID) 75 MCG tablet TAKE 1 TABLET BY MOUTH EVERY MORNING ON AN EMPTY STOMACH 90  . lisinopril (PRINIVIL,ZESTRIL) 10 MG tablet Take by mouth.  . loperamide (IMODIUM) 2 MG capsule Take by mouth as needed for diarrhea or loose stools. One by mouth four times daily.  . metoprolol (LOPRESSOR) 50 MG tablet Take by mouth 3 (three) times daily.   . mometasone (NASONEX) 50 MCG/ACT nasal spray Place 2 sprays into the nose daily. (Patient taking differently: Place 2 sprays into the nose daily as needed. )  . mycophenolate (MYFORTIC) 180 MG EC tablet Take 180 mg by mouth 2 (two) times daily.  . NONFORMULARY OR COMPOUNDED ITEM Antifungal solution: Terbinafine 3%, Fluconazole 2%, Tea Tree Oil 5%, Urea 10%, Ibuprofen 2% in DMSO suspension #76mL  . omeprazole (PRILOSEC) 20 MG capsule Take 20 mg by mouth daily.  . predniSONE (DELTASONE) 5 MG tablet Take 5 mg by mouth daily with breakfast.  . simvastatin (ZOCOR) 10 MG tablet TAKE 1 TABLET BY MOUTH EVERY DAY IN THE  EVENING  . tacrolimus (PROGRAF) 1 MG capsule 7 capsules 2 times per day  . terbinafine (LAMISIL) 250 MG tablet Take 250 mg by mouth daily.  Marland Kitchen ketorolac (ACULAR) 0.5 % ophthalmic solution INSTILL 1 DROP INTO RIGHT EYE FOUR TIMES A DAY STARTING 1 DAY BEFORE SURGERY  . levothyroxine (SYNTHROID, LEVOTHROID) 25 MCG tablet Take 25 mcg by mouth daily.  . magnesium oxide (MAG-OX) 400 MG tablet Take by mouth.  Marland Kitchen ofloxacin (OCUFLOX) 0.3 % ophthalmic solution INSTILL 1 DROP INTO  RIGHT EYE FOUR TIMES A DAY STARTING 1 DAY BEFORE SURGERY  . prednisoLONE acetate (PRED FORTE) 1 % ophthalmic suspension INSTILL 1 DROP INTO RIGHT EYE FOUR TIMES A DAY AFTER SURGERY   No facility-administered encounter medications on file as of 04/19/2019.     Activities of Daily Living In your present state of health, do you have any difficulty performing the following activities: 04/19/2019  Hearing? N  Vision? N  Difficulty concentrating or making decisions? N  Walking or climbing stairs? N  Dressing or bathing? N  Doing errands, shopping? N  Preparing Food and eating ? N  Using the Toilet? N  In the past six months, have you accidently leaked urine? Y  Comment wears a pad  Do you have problems with loss of bowel control? Y  Comment wears a pad  Managing your Medications? N  Managing your Finances? N  Housekeeping or managing your Housekeeping? N  Some recent data might be hidden    Patient Care Team: Glendale Chard, MD as PCP - General (Internal Medicine)    Assessment:   This is a routine wellness examination for [place].  Exercise Activities and Dietary recommendations Current Exercise Habits: The patient does not participate in regular exercise at present  Goals    . Weight (lb) < 200 lb (90.7 kg)     Patient wants to lose around 20 lbs       Fall Risk Fall Risk  04/19/2019 08/21/2018 11/16/2017 12/09/2015 10/28/2015  Falls in the past year? 0 0 No No No  Risk for fall due to : Medication side effect -  - - -  Follow up Falls evaluation completed;Falls prevention discussed - - - -   Is the patient's home free of loose throw rugs in walkways, pet beds, electrical cords, etc?   yes      Grab bars in the bathroom? no      Handrails on the stairs?   yes      Adequate lighting?   yes  Timed Get Up and Go performed: n/a  Depression Screen PHQ 2/9 Scores 04/19/2019 08/21/2018 11/16/2017 12/09/2015  PHQ - 2 Score 0 0 0 0  PHQ- 9 Score 1 - - -     Cognitive Function     6CIT Screen 04/19/2019  What Year? 0 points  What month? 0 points  What time? 0 points  Count back from 20 0 points  Months in reverse 0 points  Repeat phrase 0 points  Total Score 0    Immunization History  Administered Date(s) Administered  . Influenza-Unspecified 05/24/2015, 05/09/2018    Qualifies for Shingles Vaccine?Yes  Screening Tests Health Maintenance  Topic Date Due  . HEMOGLOBIN A1C  02/19/2019  . HIV Screening  08/22/2019 (Originally 06/23/1971)  . TETANUS/TDAP  04/18/2020 (Originally 06/23/1974)  . OPHTHALMOLOGY EXAM  01/30/2020  . MAMMOGRAM  03/17/2020  . FOOT EXAM  04/01/2020  . COLONOSCOPY  01/31/2022  . INFLUENZA VACCINE  Completed  . PNEUMOCOCCAL POLYSACCHARIDE VACCINE AGE 72-64 HIGH RISK  Completed  . Hepatitis C Screening  Completed    Cancer Screenings: Lung: Low Dose CT Chest recommended if Age 59-80 years, 30 pack-year currently smoking OR have quit w/in 15years. Patient does not qualify. Breast:  Up to date on Mammogram? Yes   Up to date of Bone Density/Dexa? n/a Colorectal: 02/01/2012, up to date  Additional Screenings: : Hepatitis C Screening: 08/2018     Plan:    Patient wants to lose 20 lbs  I have personally reviewed and noted the following in the patient's chart:   . Medical and social history . Use of alcohol, tobacco or illicit drugs  . Current medications and supplements . Functional ability and status . Nutritional status . Physical activity . Advanced  directives . List of other physicians . Hospitalizations, surgeries, and ER visits in previous 12 months . Vitals . Screenings to include cognitive, depression, and falls . Referrals and appointments  In addition, I have reviewed and discussed with patient certain preventive protocols, quality metrics, and best practice recommendations. A written personalized care plan for preventive services as well as general preventive health recommendations were provided to patient.     Kellie Simmering, LPN  [id number]

## 2019-04-20 LAB — LIPID PANEL
Chol/HDL Ratio: 3.5 ratio (ref 0.0–4.4)
Cholesterol, Total: 180 mg/dL (ref 100–199)
HDL: 52 mg/dL (ref >39)
LDL Chol Calc (NIH): 85 mg/dL (ref 0–99)
Triglycerides: 263 mg/dL — ABNORMAL HIGH (ref 0–149)
VLDL Cholesterol Cal: 43 mg/dL — ABNORMAL HIGH (ref 5–40)

## 2019-04-20 LAB — THYROID PANEL WITH TSH
Free Thyroxine Index: 2.4 (ref 1.2–4.9)
T3 Uptake Ratio: 31 % (ref 24–39)
T4, Total: 7.8 ug/dL (ref 4.5–12.0)
TSH: 0.663 u[IU]/mL (ref 0.450–4.500)

## 2019-04-20 NOTE — Progress Notes (Signed)
Subjective:     Patient ID: [name] [name] , female    DOB: 1955/11/13 , 63 y.o.   MRN: [id number]   Chief Complaint  Patient presents with  . Hypertension    HPI  Hypertension This is a chronic problem. The current episode started more than 1 year ago. The problem has been gradually improving since onset. The problem is controlled. Pertinent negatives include no blurred vision, chest pain, palpitations or shortness of breath. Risk factors for coronary artery disease include obesity, post-menopausal state and sedentary lifestyle. The current treatment provides moderate improvement. Compliance problems include exercise.  Hypertensive end-organ damage includes kidney disease.     Past Medical History:  Diagnosis Date  . Blood transfusion   . Chronic kidney disease    s/p transplant, kidney wasn't removed  . Gout due to renal impairment 2010   "before finding out I had kidney failure"  . History of renal transplant   . Hyperlipidemia   . Hypertension   . Sickle cell trait ([place])   . Thyroid disease   . Type II diabetes mellitus ([place])   . UTI (lower urinary tract infection) 12/08/11   "first one ever"     Family History  Problem Relation Age of Onset  . Heart disease Father   . Hypertension Mother   . Breast cancer Cousin   . Hypertension Sister   . Colon cancer Neg Hx   . Esophageal cancer Neg Hx   . Rectal cancer Neg Hx   . Stomach cancer Neg Hx      Current Outpatient Medications:  .  amLODipine (NORVASC) 5 MG tablet, Take 5 mg by mouth daily., Disp: , Rfl:  .  aspirin EC 81 MG tablet, Take 81 mg by mouth daily., Disp: , Rfl:  .  cinacalcet (SENSIPAR) 30 MG tablet, Take 30 mg by mouth daily., Disp: , Rfl:  .  clotrimazole (LOTRIMIN) 1 % cream, Twice daily to scaly areas on feet, toewebs, palms, Disp: , Rfl:  .  glucose blood (ONETOUCH VERIO) test strip, USE TO TEST BLOOD SUGAR 4 TIMES DAILY AS DIRECTED, Disp: , Rfl:  .  Insulin Aspart (NOVOLOG FLEXPEN [place]), Inject 4  Units into the skin 3 (three) times daily. Sliding scale, Disp: , Rfl:  .  Insulin Detemir (LEVEMIR FLEXTOUCH) 100 UNIT/ML Pen, 10 units in the morning and 40 units in the evening, Disp: , Rfl:  .  ketorolac (ACULAR) 0.5 % ophthalmic solution, INSTILL 1 DROP INTO RIGHT EYE FOUR TIMES A DAY STARTING 1 DAY BEFORE SURGERY, Disp: , Rfl:  .  levothyroxine (SYNTHROID) 75 MCG tablet, TAKE 1 TABLET BY MOUTH EVERY MORNING ON AN EMPTY STOMACH 90, Disp: , Rfl:  .  levothyroxine (SYNTHROID, LEVOTHROID) 25 MCG tablet, Take 25 mcg by mouth daily., Disp: , Rfl:  .  lisinopril (PRINIVIL,ZESTRIL) 10 MG tablet, Take by mouth., Disp: , Rfl:  .  loperamide (IMODIUM) 2 MG capsule, Take by mouth as needed for diarrhea or loose stools. One by mouth four times daily., Disp: , Rfl:  .  magnesium oxide (MAG-OX) 400 MG tablet, Take by mouth., Disp: , Rfl:  .  metoprolol (LOPRESSOR) 50 MG tablet, Take by mouth 3 (three) times daily. , Disp: , Rfl:  .  mometasone (NASONEX) 50 MCG/ACT nasal spray, Place 2 sprays into the nose daily. (Patient taking differently: Place 2 sprays into the nose daily as needed. ), Disp: 17 g, Rfl: 12 .  mycophenolate (MYFORTIC) 180 MG EC tablet, Take 180 mg  by mouth 2 (two) times daily., Disp: , Rfl:  .  NONFORMULARY OR COMPOUNDED ITEM, Antifungal solution: Terbinafine 3%, Fluconazole 2%, Tea Tree Oil 5%, Urea 10%, Ibuprofen 2% in DMSO suspension #48mL, Disp: 1 each, Rfl: 3 .  ofloxacin (OCUFLOX) 0.3 % ophthalmic solution, INSTILL 1 DROP INTO RIGHT EYE FOUR TIMES A DAY STARTING 1 DAY BEFORE SURGERY, Disp: , Rfl:  .  omeprazole (PRILOSEC) 20 MG capsule, Take 20 mg by mouth daily., Disp: , Rfl:  .  prednisoLONE acetate (PRED FORTE) 1 % ophthalmic suspension, INSTILL 1 DROP INTO RIGHT EYE FOUR TIMES A DAY AFTER SURGERY, Disp: , Rfl:  .  predniSONE (DELTASONE) 5 MG tablet, Take 5 mg by mouth daily with breakfast., Disp: , Rfl:  .  simvastatin (ZOCOR) 10 MG tablet, TAKE 1 TABLET BY MOUTH EVERY DAY IN THE  EVENING, Disp: 30 tablet, Rfl: 3 .  tacrolimus (PROGRAF) 1 MG capsule, 7 capsules 2 times per day, Disp: , Rfl:  .  terbinafine (LAMISIL) 250 MG tablet, Take 250 mg by mouth daily., Disp: , Rfl:    Allergies  Allergen Reactions  . Codeine Hives  . Acetaminophen-Codeine Rash     Review of Systems  Constitutional: Negative.   Eyes: Negative for blurred vision.  Respiratory: Negative.  Negative for shortness of breath.   Cardiovascular: Negative.  Negative for chest pain and palpitations.  Gastrointestinal: Negative.   Neurological: Negative.   Psychiatric/Behavioral: Negative.      Today's Vitals   04/19/19 1521  BP: 140/80  Pulse: 68  Temp: 98.6 F (37 C)  TempSrc: Oral  SpO2: 96%  Weight: 224 lb 3.2 oz (101.7 kg)  Height: 4' 11.5" (1.511 m)   Body mass index is 44.53 kg/m.   Objective:  Physical Exam Vitals signs and nursing note reviewed.  Constitutional:      Appearance: Normal appearance. She is obese.  HENT:     Head: Normocephalic and atraumatic.  Cardiovascular:     Rate and Rhythm: Normal rate and regular rhythm.     Heart sounds: Normal heart sounds.  Pulmonary:     Effort: Pulmonary effort is normal.     Breath sounds: Normal breath sounds.  Skin:    General: Skin is warm.  Neurological:     General: No focal deficit present.     Mental Status: She is alert.  Psychiatric:        Mood and Affect: Mood normal.        Behavior: Behavior normal.         Assessment And Plan:   1. Hypertensive nephropathy  Fair control. She will continue with current meds. She is encouraged to avoid adding salt to her foods. She will continue with current meds. She will rto in six months for re-evaluation.   2. Type 2 diabetes mellitus with stage 3 chronic kidney disease, with long-term current use of insulin (HCC)  Chronic. She is followed by Dr. Chalmers Cater. I will not check her a1c today. Importance of dietary compliance was discussed with the patient.   3. Other  specified hypothyroidism  I will check thyroid panel and adjust meds as needed.  I will also forward a copy of her labs to Dr. Chalmers Cater for her review.   - Thyroid Panel With TSH - Lipid panel  4. History of renal transplant   5. Immunocompromised (HCC)  Chronic. She is on immunosupressive meds secondary to renal transplant.   6. Secondary renal hyperparathyroidism (HCC)  Chronic. She is also  followed by [facility].   7. Class 3 severe obesity due to excess calories with serious comorbidity and body mass index (BMI) of 40.0 to 44.9 in adult [facility])  Importance of achieving optimal weight to decrease risk of cardiovascular disease and cancers was discussed with the patient in full detail. Importance of regular exercise was discussed with the patient.  She is encouraged to start slowly - start with 10 minutes twice daily at least three to four days per week and to gradually build to 30 minutes five days weekly. She was given tips to incorporate more activity into her daily routine - take stairs when possible, park farther away from grocery stores, etc.      [name], MD    THE PATIENT IS ENCOURAGED TO PRACTICE SOCIAL DISTANCING DUE TO THE COVID-19 PANDEMIC.

## 2019-04-22 ENCOUNTER — Other Ambulatory Visit: Payer: Self-pay | Admitting: Internal Medicine

## 2019-05-03 DIAGNOSIS — H5703 Miosis: Secondary | ICD-10-CM | POA: Diagnosis not present

## 2019-05-03 DIAGNOSIS — H2512 Age-related nuclear cataract, left eye: Secondary | ICD-10-CM | POA: Diagnosis not present

## 2019-05-03 DIAGNOSIS — H2511 Age-related nuclear cataract, right eye: Secondary | ICD-10-CM | POA: Diagnosis not present

## 2019-05-14 DIAGNOSIS — I10 Essential (primary) hypertension: Secondary | ICD-10-CM | POA: Diagnosis not present

## 2019-05-14 DIAGNOSIS — E039 Hypothyroidism, unspecified: Secondary | ICD-10-CM | POA: Diagnosis not present

## 2019-05-14 DIAGNOSIS — E7800 Pure hypercholesterolemia, unspecified: Secondary | ICD-10-CM | POA: Diagnosis not present

## 2019-05-14 DIAGNOSIS — Z940 Kidney transplant status: Secondary | ICD-10-CM | POA: Diagnosis not present

## 2019-05-14 DIAGNOSIS — E1165 Type 2 diabetes mellitus with hyperglycemia: Secondary | ICD-10-CM | POA: Diagnosis not present

## 2019-05-31 ENCOUNTER — Other Ambulatory Visit: Payer: Self-pay

## 2019-05-31 ENCOUNTER — Ambulatory Visit
Admission: RE | Admit: 2019-05-31 | Discharge: 2019-05-31 | Disposition: A | Discharge status: Home / Self Care | Payer: Medicare HMO | Source: Ambulatory Visit | Attending: Internal Medicine | Admitting: Internal Medicine

## 2019-05-31 DIAGNOSIS — Z1231 Encounter for screening mammogram for malignant neoplasm of breast: Secondary | ICD-10-CM | POA: Diagnosis not present

## 2019-06-06 ENCOUNTER — Other Ambulatory Visit: Payer: Self-pay

## 2019-06-07 ENCOUNTER — Other Ambulatory Visit: Payer: Self-pay

## 2019-06-07 MED ORDER — ALCOHOL PREP PADS: MEDICATED_PAD | 1 refills | Status: DC

## 2019-06-11 ENCOUNTER — Other Ambulatory Visit: Payer: Self-pay

## 2019-06-11 MED ORDER — ALCOHOL PREP PADS: MEDICATED_PAD | 99 refills | Status: DC

## 2019-06-20 DIAGNOSIS — Z940 Kidney transplant status: Secondary | ICD-10-CM | POA: Diagnosis not present

## 2019-06-20 DIAGNOSIS — E785 Hyperlipidemia, unspecified: Secondary | ICD-10-CM | POA: Diagnosis not present

## 2019-06-20 DIAGNOSIS — E1129 Type 2 diabetes mellitus with other diabetic kidney complication: Secondary | ICD-10-CM | POA: Diagnosis not present

## 2019-06-20 DIAGNOSIS — I129 Hypertensive chronic kidney disease with stage 1 through stage 4 chronic kidney disease, or unspecified chronic kidney disease: Secondary | ICD-10-CM | POA: Diagnosis not present

## 2019-06-20 DIAGNOSIS — N189 Chronic kidney disease, unspecified: Secondary | ICD-10-CM | POA: Diagnosis not present

## 2019-07-02 ENCOUNTER — Ambulatory Visit (INDEPENDENT_AMBULATORY_CARE_PROVIDER_SITE_OTHER): Payer: Medicare HMO | Admitting: Podiatry

## 2019-07-02 ENCOUNTER — Encounter: Payer: Self-pay | Admitting: Podiatry

## 2019-07-02 ENCOUNTER — Other Ambulatory Visit: Payer: Self-pay

## 2019-07-02 DIAGNOSIS — N185 Chronic kidney disease, stage 5: Secondary | ICD-10-CM | POA: Diagnosis not present

## 2019-07-02 DIAGNOSIS — B351 Tinea unguium: Secondary | ICD-10-CM | POA: Diagnosis not present

## 2019-07-02 DIAGNOSIS — M79674 Pain in right toe(s): Secondary | ICD-10-CM

## 2019-07-02 DIAGNOSIS — L601 Onycholysis: Secondary | ICD-10-CM

## 2019-07-02 DIAGNOSIS — E1122 Type 2 diabetes mellitus with diabetic chronic kidney disease: Secondary | ICD-10-CM | POA: Diagnosis not present

## 2019-07-02 DIAGNOSIS — Z794 Long term (current) use of insulin: Secondary | ICD-10-CM

## 2019-07-02 DIAGNOSIS — M79675 Pain in left toe(s): Secondary | ICD-10-CM

## 2019-07-02 MED ORDER — CLOTRIMAZOLE 1 % EX CREA: TOPICAL_CREAM | CUTANEOUS | 1 refills | Status: DC

## 2019-07-02 NOTE — Patient Instructions (Signed)
Diabetes Mellitus and Foot Care Foot care is an important part of your health, especially when you have diabetes. Diabetes may cause you to have problems because of poor blood flow (circulation) to your feet and legs, which can cause your skin to:  Become thinner and drier.  Break more easily.  Heal more slowly.  Peel and crack. You may also have nerve damage (neuropathy) in your legs and feet, causing decreased feeling in them. This means that you may not notice minor injuries to your feet that could lead to more serious problems. Noticing and addressing any potential problems early is the best way to prevent future foot problems. How to care for your feet Foot hygiene  Wash your feet daily with warm water and mild soap. Do not use hot water. Then, pat your feet and the areas between your toes until they are completely dry. Do not soak your feet as this can dry your skin.  Trim your toenails straight across. Do not dig under them or around the cuticle. File the edges of your nails with an emery board or nail file.  Apply a moisturizing lotion or petroleum jelly to the skin on your feet and to dry, brittle toenails. Use lotion that does not contain alcohol and is unscented. Do not apply lotion between your toes. Shoes and socks  Wear clean socks or stockings every day. Make sure they are not too tight. Do not wear knee-high stockings since they may decrease blood flow to your legs.  Wear shoes that fit properly and have enough cushioning. Always look in your shoes before you put them on to be sure there are no objects inside.  To break in new shoes, wear them for just a few hours a day. This prevents injuries on your feet. Wounds, scrapes, corns, and calluses  Check your feet daily for blisters, cuts, bruises, sores, and redness. If you cannot see the bottom of your feet, use a mirror or ask someone for help.  Do not cut corns or calluses or try to remove them with medicine.  If you  find a minor scrape, cut, or break in the skin on your feet, keep it and the skin around it clean and dry. You may clean these areas with mild soap and water. Do not clean the area with peroxide, alcohol, or iodine.  If you have a wound, scrape, corn, or callus on your foot, look at it several times a day to make sure it is healing and not infected. Check for: ? Redness, swelling, or pain. ? Fluid or blood. ? Warmth. ? Pus or a bad smell. General instructions  Do not cross your legs. This may decrease blood flow to your feet.  Do not use heating pads or hot water bottles on your feet. They may burn your skin. If you have lost feeling in your feet or legs, you may not know this is happening until it is too late.  Protect your feet from hot and cold by wearing shoes, such as at the beach or on hot pavement.  Schedule a complete foot exam at least once a year (annually) or more often if you have foot problems. If you have foot problems, report any cuts, sores, or bruises to your health care provider immediately. Contact a health care provider if:  You have a medical condition that increases your risk of infection and you have any cuts, sores, or bruises on your feet.  You have an injury that is not   healing.  You have redness on your legs or feet.  You feel burning or tingling in your legs or feet.  You have pain or cramps in your legs and feet.  Your legs or feet are numb.  Your feet always feel cold.  You have pain around a toenail. Get help right away if:  You have a wound, scrape, corn, or callus on your foot and: ? You have pain, swelling, or redness that gets worse. ? You have fluid or blood coming from the wound, scrape, corn, or callus. ? Your wound, scrape, corn, or callus feels warm to the touch. ? You have pus or a bad smell coming from the wound, scrape, corn, or callus. ? You have a fever. ? You have a red line going up your leg. Summary  Check your feet every day  for cuts, sores, red spots, swelling, and blisters.  Moisturize feet and legs daily.  Wear shoes that fit properly and have enough cushioning.  If you have foot problems, report any cuts, sores, or bruises to your health care provider immediately.  Schedule a complete foot exam at least once a year (annually) or more often if you have foot problems. This information is not intended to replace advice given to you by your health care provider. Make sure you discuss any questions you have with your health care provider. Document Released: 07/23/2000 Document Revised: 09/07/2017 Document Reviewed: 08/27/2016 Elsevier Patient Education  2020 [employer]  

## 2019-07-07 NOTE — Progress Notes (Signed)
Subjective: [name] [name] is seen today for follow up painful, elongated, thickened toenails bilateral feet that she cannot cut. Pain interferes with daily activities. Aggravating factor includes wearing enclosed shoe gear and relieved with periodic debridement.  Medications reviewed in chart.  Allergies  Allergen Reactions  . Codeine Hives  . Acetaminophen-Codeine Rash    Objective:  Vascular Examination: Capillary refill time <3 seconds b/l.  Dorsalis pedis present b/l.  Posterior tibial pulses present b/l.  Digital hair absent b/l.   Skin temperature gradient WNL b/l.   Dermatological Examination: Skin with normal turgor, texture and tone b/l.  Toenails b/l great toes discolored, thick, dystrophic with subungual debris and pain with palpation to nailbeds due to thickness of nails.   There is noted onchyolysis of entire nailplate of b/l great toes.  The nailbed remains intact. There is no erythema, no edema, no drainage, no underlying flocculence.  Toenails 2-5 b/l nondystrophic b/l.  Musculoskeletal: Muscle strength 5/5 to all LE muscle groups b/l.  Neurological Examination: Protective sensation intact 5/5 with 10 gram monofilament bilaterally.  Epicritic sensation present bilaterally.  Vibratory sensation intact bilaterally.   Assessment: Painful onychomycosis toenails b/l great toes Onycholysis of nailplate b/l great toes  Plan: 1. Toenails b/l great toes gently debrided from it's remaining attachment to digit. Nailbed cleansed with alcohol. Triple antibiotic ointment applied. Rx for Clotrimazole Cream 1% to be applied to nailbeds of both great toe daily. 2. Patient to continue soft, supportive shoe gear daily. 3. Patient to report any pedal injuries to medical professional immediately. 4. Follow up 3 months.  5. Patient/POA to call should there be a concern in the interim.

## 2019-08-06 DIAGNOSIS — Z940 Kidney transplant status: Secondary | ICD-10-CM | POA: Diagnosis not present

## 2019-08-06 DIAGNOSIS — H43813 Vitreous degeneration, bilateral: Secondary | ICD-10-CM | POA: Diagnosis not present

## 2019-08-06 DIAGNOSIS — E119 Type 2 diabetes mellitus without complications: Secondary | ICD-10-CM | POA: Diagnosis not present

## 2019-08-06 DIAGNOSIS — H43391 Other vitreous opacities, right eye: Secondary | ICD-10-CM | POA: Diagnosis not present

## 2019-08-21 ENCOUNTER — Other Ambulatory Visit: Payer: Self-pay

## 2019-08-21 MED ORDER — ALCOHOL PREP PADS: MEDICATED_PAD | 11 refills | Status: DC

## 2019-08-29 DIAGNOSIS — E1129 Type 2 diabetes mellitus with other diabetic kidney complication: Secondary | ICD-10-CM | POA: Diagnosis not present

## 2019-08-29 DIAGNOSIS — Z940 Kidney transplant status: Secondary | ICD-10-CM | POA: Diagnosis not present

## 2019-08-29 DIAGNOSIS — N189 Chronic kidney disease, unspecified: Secondary | ICD-10-CM | POA: Diagnosis not present

## 2019-09-11 DIAGNOSIS — I129 Hypertensive chronic kidney disease with stage 1 through stage 4 chronic kidney disease, or unspecified chronic kidney disease: Secondary | ICD-10-CM | POA: Diagnosis not present

## 2019-09-11 DIAGNOSIS — E785 Hyperlipidemia, unspecified: Secondary | ICD-10-CM | POA: Diagnosis not present

## 2019-09-11 DIAGNOSIS — E1122 Type 2 diabetes mellitus with diabetic chronic kidney disease: Secondary | ICD-10-CM | POA: Diagnosis not present

## 2019-09-11 DIAGNOSIS — D849 Immunodeficiency, unspecified: Secondary | ICD-10-CM | POA: Diagnosis not present

## 2019-09-11 DIAGNOSIS — N1831 Chronic kidney disease, stage 3a: Secondary | ICD-10-CM | POA: Diagnosis not present

## 2019-09-11 DIAGNOSIS — D631 Anemia in chronic kidney disease: Secondary | ICD-10-CM | POA: Diagnosis not present

## 2019-09-11 DIAGNOSIS — N2581 Secondary hyperparathyroidism of renal origin: Secondary | ICD-10-CM | POA: Diagnosis not present

## 2019-10-05 ENCOUNTER — Ambulatory Visit: Payer: Medicare HMO | Admitting: Podiatry

## 2019-10-12 DIAGNOSIS — E039 Hypothyroidism, unspecified: Secondary | ICD-10-CM | POA: Diagnosis not present

## 2019-10-12 DIAGNOSIS — I10 Essential (primary) hypertension: Secondary | ICD-10-CM | POA: Diagnosis not present

## 2019-10-12 DIAGNOSIS — E7800 Pure hypercholesterolemia, unspecified: Secondary | ICD-10-CM | POA: Diagnosis not present

## 2019-10-12 DIAGNOSIS — E1165 Type 2 diabetes mellitus with hyperglycemia: Secondary | ICD-10-CM | POA: Diagnosis not present

## 2019-10-12 DIAGNOSIS — Z940 Kidney transplant status: Secondary | ICD-10-CM | POA: Diagnosis not present

## 2019-10-17 ENCOUNTER — Other Ambulatory Visit: Payer: Self-pay

## 2019-10-17 ENCOUNTER — Encounter: Payer: Self-pay | Admitting: Internal Medicine

## 2019-10-17 ENCOUNTER — Ambulatory Visit (INDEPENDENT_AMBULATORY_CARE_PROVIDER_SITE_OTHER): Payer: Medicare HMO | Admitting: Internal Medicine

## 2019-10-17 VITALS — BP 146/74 | HR 70 | Temp 98.0°F | Ht 59.5 in | Wt 216.4 lb

## 2019-10-17 DIAGNOSIS — N2581 Secondary hyperparathyroidism of renal origin: Secondary | ICD-10-CM

## 2019-10-17 DIAGNOSIS — Z940 Kidney transplant status: Secondary | ICD-10-CM | POA: Diagnosis not present

## 2019-10-17 DIAGNOSIS — E1122 Type 2 diabetes mellitus with diabetic chronic kidney disease: Secondary | ICD-10-CM

## 2019-10-17 DIAGNOSIS — D849 Immunodeficiency, unspecified: Secondary | ICD-10-CM

## 2019-10-17 DIAGNOSIS — N183 Chronic kidney disease, stage 3 unspecified: Secondary | ICD-10-CM

## 2019-10-17 DIAGNOSIS — I129 Hypertensive chronic kidney disease with stage 1 through stage 4 chronic kidney disease, or unspecified chronic kidney disease: Secondary | ICD-10-CM

## 2019-10-17 DIAGNOSIS — M62830 Muscle spasm of back: Secondary | ICD-10-CM

## 2019-10-17 DIAGNOSIS — Z794 Long term (current) use of insulin: Secondary | ICD-10-CM

## 2019-10-17 DIAGNOSIS — Z6841 Body Mass Index (BMI) 40.0 and over, adult: Secondary | ICD-10-CM

## 2019-10-17 DIAGNOSIS — D573 Sickle-cell trait: Secondary | ICD-10-CM | POA: Diagnosis not present

## 2019-10-17 DIAGNOSIS — K219 Gastro-esophageal reflux disease without esophagitis: Secondary | ICD-10-CM

## 2019-10-17 LAB — POCT URINALYSIS DIPSTICK
Bilirubin, UA: NEGATIVE
Blood, UA: NEGATIVE
Glucose, UA: POSITIVE — AB
Ketones, UA: NEGATIVE
Leukocytes, UA: NEGATIVE
Nitrite, UA: NEGATIVE
Protein, UA: NEGATIVE
Spec Grav, UA: >=1.03 — AB (ref 1.010–1.025)
Urobilinogen, UA: 0.2 E.U./dL
pH, UA: 5 (ref 5.0–8.0)

## 2019-10-17 MED ORDER — DIAZEPAM 2 MG PO TABS: 2.0000 mg | ORAL_TABLET | Freq: Two times a day (BID) | ORAL | 0 refills | Status: DC | PRN

## 2019-10-17 NOTE — Patient Instructions (Signed)
Increase omeprazole 20mg  to TWO tablets daily   Gastroesophageal Reflux Disease, Adult Gastroesophageal reflux (GER) happens when acid from the stomach flows up into the tube that connects the mouth and the stomach (esophagus). Normally, food travels down the esophagus and stays in the stomach to be digested. With GER, food and stomach acid sometimes move back up into the esophagus. You may have a disease called gastroesophageal reflux disease (GERD) if the reflux:  Happens often.  Causes frequent or very bad symptoms.  Causes problems such as damage to the esophagus. When this happens, the esophagus becomes sore and swollen (inflamed). Over time, GERD can make small holes (ulcers) in the lining of the esophagus. What are the causes? This condition is caused by a problem with the muscle between the esophagus and the stomach. When this muscle is weak or not normal, it does not close properly to keep food and acid from coming back up from the stomach. The muscle can be weak because of:  Tobacco use.  Pregnancy.  Having a certain type of hernia (hiatal hernia).  Alcohol use.  Certain foods and drinks, such as coffee, chocolate, onions, and peppermint. What increases the risk? You are more likely to develop this condition if you:  Are overweight.  Have a disease that affects your connective tissue.  Use NSAID medicines. What are the signs or symptoms? Symptoms of this condition include:  Heartburn.  Difficult or painful swallowing.  The feeling of having a lump in the throat.  A bitter taste in the mouth.  Bad breath.  Having a lot of saliva.  Having an upset or bloated stomach.  Belching.  Chest pain. Different conditions can cause chest pain. Make sure you see your doctor if you have chest pain.  Shortness of breath or noisy breathing (wheezing).  Ongoing (chronic) cough or a cough at night.  Wearing away of the surface of teeth (tooth enamel).  Weight  loss. How is this treated? Treatment will depend on how bad your symptoms are. Your doctor may suggest:  Changes to your diet.  Medicine.  Surgery. Follow these instructions at home: Eating and drinking   Follow a diet as told by your doctor. You may need to avoid foods and drinks such as: ? Coffee and tea (with or without caffeine). ? Drinks that contain alcohol. ? Energy drinks and sports drinks. ? Bubbly (carbonated) drinks or sodas. ? Chocolate and cocoa. ? Peppermint and mint flavorings. ? Garlic and onions. ? Horseradish. ? Spicy and acidic foods. These include peppers, chili powder, curry powder, vinegar, hot sauces, and BBQ sauce. ? Citrus fruit juices and citrus fruits, such as oranges, lemons, and limes. ? Tomato-based foods. These include red sauce, chili, salsa, and pizza with red sauce. ? Fried and fatty foods. These include donuts, french fries, potato chips, and high-fat dressings. ? High-fat meats. These include hot dogs, rib eye steak, sausage, ham, and bacon. ? High-fat dairy items, such as whole milk, butter, and cream cheese.  Eat small meals often. Avoid eating large meals.  Avoid drinking large amounts of liquid with your meals.  Avoid eating meals during the 2-3 hours before bedtime.  Avoid lying down right after you eat.  Do not exercise right after you eat. Lifestyle   Do not use any products that contain nicotine or tobacco. These include cigarettes, e-cigarettes, and chewing tobacco. If you need help quitting, ask your doctor.  Try to lower your stress. If you need help doing this, ask your  doctor.  If you are overweight, lose an amount of weight that is healthy for you. Ask your doctor about a safe weight loss goal. General instructions  Pay attention to any changes in your symptoms.  Take over-the-counter and prescription medicines only as told by your doctor. Do not take aspirin, ibuprofen, or other NSAIDs unless your doctor says it is  okay.  Wear loose clothes. Do not wear anything tight around your waist.  Raise (elevate) the head of your bed about 6 inches (15 cm).  Avoid bending over if this makes your symptoms worse.  Keep all follow-up visits as told by your doctor. This is important. Contact a doctor if:  You have new symptoms.  You lose weight and you do not know why.  You have trouble swallowing or it hurts to swallow.  You have wheezing or a cough that keeps happening.  Your symptoms do not get better with treatment.  You have a hoarse voice. Get help right away if:  You have pain in your arms, neck, jaw, teeth, or back.  You feel sweaty, dizzy, or light-headed.  You have chest pain or shortness of breath.  You throw up (vomit) and your throw-up looks like blood or coffee grounds.  You pass out (faint).  Your poop (stool) is bloody or black.  You cannot swallow, drink, or eat. Summary  If a person has gastroesophageal reflux disease (GERD), food and stomach acid move back up into the esophagus and cause symptoms or problems such as damage to the esophagus.  Treatment will depend on how bad your symptoms are.  Follow a diet as told by your doctor.  Take all medicines only as told by your doctor. This information is not intended to replace advice given to you by your health care provider. Make sure you discuss any questions you have with your health care provider. Document Revised: 02/01/2018 Document Reviewed: 02/01/2018 Elsevier Patient Education  2020 [place] for Gastroesophageal Reflux Disease, Adult When you have gastroesophageal reflux disease (GERD), the foods you eat and your eating habits are very important. Choosing the right foods can help ease your discomfort. Think about working with a nutrition specialist (dietitian) to help you make good choices. What are tips for following this plan?  Meals  Choose healthy foods that are low in fat, such as  fruits, vegetables, whole grains, low-fat dairy products, and lean meat, fish, and poultry.  Eat small meals often instead of 3 large meals a day. Eat your meals slowly, and in a place where you are relaxed. Avoid bending over or lying down until 2-3 hours after eating.  Avoid eating meals 2-3 hours before bed.  Avoid drinking a lot of liquid with meals.  Cook foods using methods other than frying. Bake, grill, or broil food instead.  Avoid or limit: ? Chocolate. ? Peppermint or spearmint. ? Alcohol. ? Pepper. ? Black and decaffeinated coffee. ? Black and decaffeinated tea. ? Bubbly (carbonated) soft drinks. ? Caffeinated energy drinks and soft drinks.  Limit high-fat foods such as: ? Fatty meat or fried foods. ? Whole milk, cream, butter, or ice cream. ? Nuts and nut butters. ? Pastries, donuts, and sweets made with butter or shortening.  Avoid foods that cause symptoms. These foods may be different for everyone. Common foods that cause symptoms include: ? Tomatoes. ? Oranges, lemons, and limes. ? Peppers. ? Spicy food. ? Onions and garlic. ? Vinegar. Lifestyle  Maintain a healthy weight. Ask  your doctor what weight is healthy for you. If you need to lose weight, work with your doctor to do so safely.  Exercise for at least 30 minutes for 5 or more days each week, or as told by your doctor.  Wear loose-fitting clothes.  Do not smoke. If you need help quitting, ask your doctor.  Sleep with the head of your bed higher than your feet. Use a wedge under the mattress or blocks under the bed frame to raise the head of the bed. Summary  When you have gastroesophageal reflux disease (GERD), food and lifestyle choices are very important in easing your symptoms.  Eat small meals often instead of 3 large meals a day. Eat your meals slowly, and in a place where you are relaxed.  Limit high-fat foods such as fatty meat or fried foods.  Avoid bending over or lying down until  2-3 hours after eating.  Avoid peppermint and spearmint, caffeine, alcohol, and chocolate. This information is not intended to replace advice given to you by your health care provider. Make sure you discuss any questions you have with your health care provider. Document Revised: 11/16/2018 Document Reviewed: 08/31/2016 Elsevier Patient Education  [place].

## 2019-10-23 NOTE — Progress Notes (Signed)
This visit occurred during the SARS-CoV-2 public health emergency.  Safety protocols were in place, including screening questions prior to the visit, additional usage of staff PPE, and extensive cleaning of exam room while observing appropriate contact time as indicated for disinfecting solutions.  Subjective:     Patient ID: [name] [name] , female    DOB: Oct 28, 1955 , 64 y.o.   MRN: [id number]   Chief Complaint  Patient presents with  . Hypertension  . Diabetes    HPI  She is here today for BP check. She has her diabetes followed by Dr. Chalmers Cater. Reports most recent hba1c is 8.2.   Hypertension This is a chronic problem. The current episode started more than 1 year ago. The problem has been gradually improving since onset. The problem is controlled. Pertinent negatives include no blurred vision, chest pain, palpitations or shortness of breath. Risk factors for coronary artery disease include obesity, post-menopausal state and sedentary lifestyle. The current treatment provides moderate improvement. Compliance problems include exercise.  Hypertensive end-organ damage includes kidney disease.  Diabetes There are no hypoglycemic associated symptoms. Pertinent negatives for diabetes include no blurred vision and no chest pain. There are no hypoglycemic complications. Risk factors for coronary artery disease include diabetes mellitus, dyslipidemia, hypertension and post-menopausal.     Past Medical History:  Diagnosis Date  . Blood transfusion   . Chronic kidney disease    s/p transplant, kidney wasn't removed  . Gout due to renal impairment 2010   "before finding out I had kidney failure"  . History of renal transplant   . Hyperlipidemia   . Hypertension   . Sickle cell trait ([place])   . Thyroid disease   . Type II diabetes mellitus ([place])   . UTI (lower urinary tract infection) 12/08/11   "first one ever"     Family History  Problem Relation Age of Onset  . Heart disease Father    . Hypertension Mother   . Breast cancer Cousin   . Hypertension Sister   . Hypertension Daughter   . Colon cancer Neg Hx   . Esophageal cancer Neg Hx   . Rectal cancer Neg Hx   . Stomach cancer Neg Hx      Current Outpatient Medications:  .  Alcohol Swabs (ALCOHOL PREP) PADS, Use as directed 5 times per day with insulin pen, Disp: 600 each, Rfl: 11 .  amLODipine (NORVASC) 5 MG tablet, Take 5 mg by mouth daily., Disp: , Rfl:  .  aspirin EC 81 MG tablet, Take 81 mg by mouth daily., Disp: , Rfl:  .  cinacalcet (SENSIPAR) 30 MG tablet, Take 30 mg by mouth daily., Disp: , Rfl:  .  clotrimazole (LOTRIMIN) 1 % cream, Apply to skin on both great toes once daily, Disp: 45 g, Rfl: 1 .  glucose blood (ONETOUCH VERIO) test strip, USE TO TEST BLOOD SUGAR 4 TIMES DAILY AS DIRECTED, Disp: , Rfl:  .  Insulin Aspart (NOVOLOG FLEXPEN [place]), Inject 4 Units into the skin 3 (three) times daily. Sliding scale, Disp: , Rfl:  .  Insulin Detemir (LEVEMIR FLEXTOUCH) 100 UNIT/ML Pen, 30 units in the morning and 25 units in the evening, Disp: , Rfl:  .  levothyroxine (SYNTHROID) 75 MCG tablet, TAKE 1 TABLET BY MOUTH EVERY MORNING ON AN EMPTY STOMACH 90, Disp: , Rfl:  .  lisinopril (PRINIVIL,ZESTRIL) 10 MG tablet, Take by mouth., Disp: , Rfl:  .  loperamide (IMODIUM) 2 MG capsule, Take by mouth as needed for diarrhea  or loose stools. One by mouth four times daily., Disp: , Rfl:  .  metoprolol (LOPRESSOR) 50 MG tablet, Take 50 mg by mouth 3 (three) times daily. 3 tabs 2 times per day, Disp: , Rfl:  .  mometasone (NASONEX) 50 MCG/ACT nasal spray, Place 2 sprays into the nose daily. (Patient taking differently: Place 2 sprays into the nose daily as needed. ), Disp: 17 g, Rfl: 12 .  mycophenolate (MYFORTIC) 180 MG EC tablet, Take 180 mg by mouth 2 (two) times daily., Disp: , Rfl:  .  NONFORMULARY OR COMPOUNDED ITEM, Antifungal solution: Terbinafine 3%, Fluconazole 2%, Tea Tree Oil 5%, Urea 10%, Ibuprofen 2% in DMSO  suspension #107mL, Disp: 1 each, Rfl: 3 .  omeprazole (PRILOSEC) 20 MG capsule, Take 20 mg by mouth daily., Disp: , Rfl:  .  predniSONE (DELTASONE) 5 MG tablet, Take 5 mg by mouth daily with breakfast., Disp: , Rfl:  .  simvastatin (ZOCOR) 10 MG tablet, TAKE 1 TABLET BY MOUTH EVERY DAY IN THE EVENING, Disp: 90 tablet, Rfl: 1 .  tacrolimus (PROGRAF) 1 MG capsule, 7 capsules 2 times per day, Disp: , Rfl:  .  terbinafine (LAMISIL) 250 MG tablet, Take 250 mg by mouth daily., Disp: , Rfl:  .  diazepam (VALIUM) 2 MG tablet, Take 1 tablet (2 mg total) by mouth every 12 (twelve) hours as needed for muscle spasms., Disp: 20 tablet, Rfl: 0   Allergies  Allergen Reactions  . Codeine Hives  . Acetaminophen-Codeine Rash     Review of Systems  Constitutional: Negative.   Eyes: Negative for blurred vision.  Respiratory: Negative.  Negative for shortness of breath.   Cardiovascular: Negative.  Negative for chest pain and palpitations.  Gastrointestinal: Negative.        She c/o worsening reflux. Not controlled with omeprazole 20mg  daily.   Musculoskeletal: Positive for myalgias.       She c/o pulled muscle in her back. Denies fall/trauma. Not sure what triggered her sx.   Neurological: Negative.   Psychiatric/Behavioral: Negative.      Today's Vitals   10/17/19 1412  BP: (!) 146/74  Pulse: 70  Temp: 98 F (36.7 C)  TempSrc: Oral  Weight: 216 lb 6.4 oz (98.2 kg)  Height: 4' 11.5" (1.511 m)  PainSc: 0-No pain  PainLoc: Back   Body mass index is 42.98 kg/m.   Objective:  Physical Exam Vitals and nursing note reviewed.  Constitutional:      Appearance: Normal appearance. She is obese.  HENT:     Head: Normocephalic and atraumatic.  Cardiovascular:     Rate and Rhythm: Normal rate and regular rhythm.     Heart sounds: Normal heart sounds.  Pulmonary:     Effort: Pulmonary effort is normal.     Breath sounds: Normal breath sounds.  Skin:    General: Skin is warm.  Neurological:      General: No focal deficit present.     Mental Status: She is alert.  Psychiatric:        Mood and Affect: Mood normal.        Behavior: Behavior normal.         Assessment And Plan:     1. Hypertensive nephropathy  Chronic, fair control. She will continue with current meds. She is encouraged to avoid adding salt to her foods.   - POCT Urinalysis Dipstick (81002)  2. Type 2 diabetes mellitus with stage 3 chronic kidney disease, with long-term current use of insulin,  unspecified whether stage 3a or 3b CKD ([place])  Again, most recent hba1c was reportedly 8.2. She is encouraged to avoid sugary beverages and to increase her daily routine.   3. Secondary renal hyperparathyroidism (HCC)  Chronic. This is followed by [facility].   4. Gastroesophageal reflux disease without esophagitis  Chronic. I will increase omeprazole to 40mg  daily. She will take TWO 20mg  capsules once daily until she runs out. She is encouraged to contact me in a week or two to let me know if she has improvement in her sx. If not, I will consider switching her to [place].   5. Muscle spasm of back  She is advised to stay well hydrated. She may benefit from magnesium supplementation. She is also advised to apply muscle rub to affected area nightly.   6. Immunocompromised ([place])  She reports compliance with her post-transplant medications.   7. History of renal transplant   8. Class 3 severe obesity due to excess calories with serious comorbidity and body mass index (BMI) of 40.0 to 44.9 in adult (HCC)  BMI 42. She is encouraged to strive for BMI less than 37 to decrease cardiac risk.  She is encouraged to incorporate more activity into her daily routine.    I personally spent 30 minutes face-to-face and non-face-to-face in the care of this patient, which includes all pre-, intra-, and post visit time on the date of service.   [name], MD    THE PATIENT IS ENCOURAGED TO PRACTICE SOCIAL DISTANCING DUE TO  THE COVID-19 PANDEMIC.

## 2019-10-27 ENCOUNTER — Other Ambulatory Visit: Payer: Self-pay | Admitting: Internal Medicine

## 2019-10-31 DIAGNOSIS — N189 Chronic kidney disease, unspecified: Secondary | ICD-10-CM | POA: Diagnosis not present

## 2019-10-31 DIAGNOSIS — E785 Hyperlipidemia, unspecified: Secondary | ICD-10-CM | POA: Diagnosis not present

## 2019-10-31 DIAGNOSIS — I129 Hypertensive chronic kidney disease with stage 1 through stage 4 chronic kidney disease, or unspecified chronic kidney disease: Secondary | ICD-10-CM | POA: Diagnosis not present

## 2019-10-31 DIAGNOSIS — Z940 Kidney transplant status: Secondary | ICD-10-CM | POA: Diagnosis not present

## 2019-12-04 DIAGNOSIS — I10 Essential (primary) hypertension: Secondary | ICD-10-CM | POA: Diagnosis not present

## 2019-12-04 DIAGNOSIS — E039 Hypothyroidism, unspecified: Secondary | ICD-10-CM | POA: Diagnosis not present

## 2019-12-04 DIAGNOSIS — E7800 Pure hypercholesterolemia, unspecified: Secondary | ICD-10-CM | POA: Diagnosis not present

## 2019-12-04 DIAGNOSIS — Z940 Kidney transplant status: Secondary | ICD-10-CM | POA: Diagnosis not present

## 2019-12-04 DIAGNOSIS — E1165 Type 2 diabetes mellitus with hyperglycemia: Secondary | ICD-10-CM | POA: Diagnosis not present

## 2019-12-12 DIAGNOSIS — H43811 Vitreous degeneration, right eye: Secondary | ICD-10-CM | POA: Diagnosis not present

## 2019-12-12 DIAGNOSIS — E119 Type 2 diabetes mellitus without complications: Secondary | ICD-10-CM | POA: Diagnosis not present

## 2019-12-12 DIAGNOSIS — Z961 Presence of intraocular lens: Secondary | ICD-10-CM | POA: Diagnosis not present

## 2019-12-19 ENCOUNTER — Ambulatory Visit: Payer: Medicare HMO | Admitting: Podiatry

## 2019-12-19 ENCOUNTER — Other Ambulatory Visit: Payer: Self-pay

## 2019-12-19 ENCOUNTER — Encounter: Payer: Self-pay | Admitting: Podiatry

## 2019-12-19 DIAGNOSIS — E1122 Type 2 diabetes mellitus with diabetic chronic kidney disease: Secondary | ICD-10-CM | POA: Diagnosis not present

## 2019-12-19 DIAGNOSIS — Z794 Long term (current) use of insulin: Secondary | ICD-10-CM | POA: Diagnosis not present

## 2019-12-19 DIAGNOSIS — B351 Tinea unguium: Secondary | ICD-10-CM

## 2019-12-19 DIAGNOSIS — M79675 Pain in left toe(s): Secondary | ICD-10-CM | POA: Diagnosis not present

## 2019-12-19 DIAGNOSIS — N185 Chronic kidney disease, stage 5: Secondary | ICD-10-CM | POA: Diagnosis not present

## 2019-12-19 DIAGNOSIS — M79674 Pain in right toe(s): Secondary | ICD-10-CM

## 2019-12-19 NOTE — Patient Instructions (Signed)
Diabetes Mellitus and Foot Care Foot care is an important part of your health, especially when you have diabetes. Diabetes may cause you to have problems because of poor blood flow (circulation) to your feet and legs, which can cause your skin to:  Become thinner and drier.  Break more easily.  Heal more slowly.  Peel and crack. You may also have nerve damage (neuropathy) in your legs and feet, causing decreased feeling in them. This means that you may not notice minor injuries to your feet that could lead to more serious problems. Noticing and addressing any potential problems early is the best way to prevent future foot problems. How to care for your feet Foot hygiene  Wash your feet daily with warm water and mild soap. Do not use hot water. Then, pat your feet and the areas between your toes until they are completely dry. Do not soak your feet as this can dry your skin.  Trim your toenails straight across. Do not dig under them or around the cuticle. File the edges of your nails with an emery board or nail file.  Apply a moisturizing lotion or petroleum jelly to the skin on your feet and to dry, brittle toenails. Use lotion that does not contain alcohol and is unscented. Do not apply lotion between your toes. Shoes and socks  Wear clean socks or stockings every day. Make sure they are not too tight. Do not wear knee-high stockings since they may decrease blood flow to your legs.  Wear shoes that fit properly and have enough cushioning. Always look in your shoes before you put them on to be sure there are no objects inside.  To break in new shoes, wear them for just a few hours a day. This prevents injuries on your feet. Wounds, scrapes, corns, and calluses  Check your feet daily for blisters, cuts, bruises, sores, and redness. If you cannot see the bottom of your feet, use a mirror or ask someone for help.  Do not cut corns or calluses or try to remove them with medicine.  If you  find a minor scrape, cut, or break in the skin on your feet, keep it and the skin around it clean and dry. You may clean these areas with mild soap and water. Do not clean the area with peroxide, alcohol, or iodine.  If you have a wound, scrape, corn, or callus on your foot, look at it several times a day to make sure it is healing and not infected. Check for: ? Redness, swelling, or pain. ? Fluid or blood. ? Warmth. ? Pus or a bad smell. General instructions  Do not cross your legs. This may decrease blood flow to your feet.  Do not use heating pads or hot water bottles on your feet. They may burn your skin. If you have lost feeling in your feet or legs, you may not know this is happening until it is too late.  Protect your feet from hot and cold by wearing shoes, such as at the beach or on hot pavement.  Schedule a complete foot exam at least once a year (annually) or more often if you have foot problems. If you have foot problems, report any cuts, sores, or bruises to your health care provider immediately. Contact a health care provider if:  You have a medical condition that increases your risk of infection and you have any cuts, sores, or bruises on your feet.  You have an injury that is not   healing.  You have redness on your legs or feet.  You feel burning or tingling in your legs or feet.  You have pain or cramps in your legs and feet.  Your legs or feet are numb.  Your feet always feel cold.  You have pain around a toenail. Get help right away if:  You have a wound, scrape, corn, or callus on your foot and: ? You have pain, swelling, or redness that gets worse. ? You have fluid or blood coming from the wound, scrape, corn, or callus. ? Your wound, scrape, corn, or callus feels warm to the touch. ? You have pus or a bad smell coming from the wound, scrape, corn, or callus. ? You have a fever. ? You have a red line going up your leg. Summary  Check your feet every day  for cuts, sores, red spots, swelling, and blisters.  Moisturize feet and legs daily.  Wear shoes that fit properly and have enough cushioning.  If you have foot problems, report any cuts, sores, or bruises to your health care provider immediately.  Schedule a complete foot exam at least once a year (annually) or more often if you have foot problems. This information is not intended to replace advice given to you by your health care provider. Make sure you discuss any questions you have with your health care provider. Document Revised: 04/18/2019 Document Reviewed: 08/27/2016 Elsevier Patient Education  2020 Elsevier Inc.  

## 2019-12-25 DIAGNOSIS — E1129 Type 2 diabetes mellitus with other diabetic kidney complication: Secondary | ICD-10-CM | POA: Diagnosis not present

## 2019-12-25 DIAGNOSIS — E785 Hyperlipidemia, unspecified: Secondary | ICD-10-CM | POA: Diagnosis not present

## 2019-12-25 DIAGNOSIS — I129 Hypertensive chronic kidney disease with stage 1 through stage 4 chronic kidney disease, or unspecified chronic kidney disease: Secondary | ICD-10-CM | POA: Diagnosis not present

## 2019-12-25 DIAGNOSIS — N189 Chronic kidney disease, unspecified: Secondary | ICD-10-CM | POA: Diagnosis not present

## 2019-12-25 DIAGNOSIS — Z940 Kidney transplant status: Secondary | ICD-10-CM | POA: Diagnosis not present

## 2019-12-28 NOTE — Progress Notes (Signed)
Subjective: Raylynn Hersh presents today at risk foot care. Patient has history of NIDDM, s/p renal transplant, immunocompromised is seen for painful mycotic nails b/l that are difficult to trim. Pain interferes with ambulation. Aggravating factors include wearing enclosed shoe gear. Pain is relieved with periodic professional debridement  Glendale Chard, MD is patient's PCP. Last visit was: 10/17/2019.  Past Medical History:  Diagnosis Date  . Blood transfusion   . Chronic kidney disease    s/p transplant, kidney wasn't removed  . Gout due to renal impairment 2010   "before finding out I had kidney failure"  . History of renal transplant   . Hyperlipidemia   . Hypertension   . Sickle cell trait ([place])   . Thyroid disease   . Type II diabetes mellitus ([place])   . UTI (lower urinary tract infection) 12/08/11   "first one ever"     Current Outpatient Medications on File Prior to Visit  Medication Sig Dispense Refill  . Alcohol Swabs (ALCOHOL PREP) PADS Use as directed 5 times per day with insulin pen 600 each 11  . amLODipine (NORVASC) 5 MG tablet Take 5 mg by mouth daily.    Marland Kitchen aspirin EC 81 MG tablet Take 81 mg by mouth daily.    . cinacalcet (SENSIPAR) 30 MG tablet Take 30 mg by mouth daily.    . clotrimazole (LOTRIMIN) 1 % cream Apply to skin on both great toes once daily 45 g 1  . diazepam (VALIUM) 2 MG tablet Take 1 tablet (2 mg total) by mouth every 12 (twelve) hours as needed for muscle spasms. 20 tablet 0  . glucose blood (ONETOUCH VERIO) test strip USE TO TEST BLOOD SUGAR 4 TIMES DAILY AS DIRECTED    . Insulin Aspart (NOVOLOG FLEXPEN ) Inject 4 Units into the skin 3 (three) times daily. Sliding scale    . Insulin Detemir (LEVEMIR FLEXTOUCH) 100 UNIT/ML Pen 30 units in the morning and 25 units in the evening    . levothyroxine (SYNTHROID) 75 MCG tablet TAKE 1 TABLET BY MOUTH EVERY MORNING ON AN EMPTY STOMACH 90    . lisinopril (PRINIVIL,ZESTRIL) 10 MG tablet Take by mouth.     . loperamide (IMODIUM) 2 MG capsule Take by mouth as needed for diarrhea or loose stools. One by mouth four times daily.    . metoprolol (LOPRESSOR) 50 MG tablet Take 50 mg by mouth 3 (three) times daily. 3 tabs 2 times per day    . mometasone (NASONEX) 50 MCG/ACT nasal spray Place 2 sprays into the nose daily. (Patient taking differently: Place 2 sprays into the nose daily as needed. ) 17 g 12  . mycophenolate (MYFORTIC) 180 MG EC tablet Take 180 mg by mouth 2 (two) times daily.    . NONFORMULARY OR COMPOUNDED ITEM Antifungal solution: Terbinafine 3%, Fluconazole 2%, Tea Tree Oil 5%, Urea 10%, Ibuprofen 2% in DMSO suspension #75mL 1 each 3  . omeprazole (PRILOSEC) 20 MG capsule Take 20 mg by mouth daily.    . predniSONE (DELTASONE) 5 MG tablet Take 5 mg by mouth daily with breakfast.    . simvastatin (ZOCOR) 10 MG tablet TAKE 1 TABLET BY MOUTH EVERY DAY IN THE EVENING 90 tablet 1  . tacrolimus (PROGRAF) 1 MG capsule 7 capsules 2 times per day    . terbinafine (LAMISIL) 250 MG tablet Take 250 mg by mouth daily.     No current facility-administered medications on file prior to visit.     Allergies  Allergen  Reactions  . Codeine Hives  . Acetaminophen-Codeine Rash   Objective: Urvi Imes is a pleasant 64 y.o. y.o. Patient Race: Black or African American [2]  female in NAD. AAO x 3.  There were no vitals filed for this visit.  Vascular Examination: Neurovascular status unchanged b/l. Capillary fill time to digits <3 seconds b/l. Palpable DP pulses b/l. Palpable PT pulses b/l. Pedal hair absent b/l Skin temperature gradient within normal limits b/l. No edema noted b/l.  Dermatological Examination: Pedal skin with normal turgor, texture and tone bilaterally. No open wounds bilaterally. No interdigital macerations bilaterally. Toenails L hallux and R hallux elongated, dystrophic, thickened, and crumbly with subungual debris and tenderness to dorsal  palpation.  Musculoskeletal: Normal muscle strength 5/5 to all lower extremity muscle groups bilaterally. No gross bony deformities bilaterally. No pain crepitus or joint limitation noted with ROM b/l.  Neurological Examination: Protective sensation intact 5/5 intact bilaterally with 10g monofilament b/l. Vibratory sensation intact b/l.  Assessment: 1. Pain due to onychomycosis of toenails of both feet   2. Type 2 diabetes mellitus with stage 5 chronic kidney disease not on chronic dialysis, with long-term current use of insulin ([place])    Plan: -Examined patient. -No new findings. No new orders. -Continue diabetic foot care principles. Literature dispensed on today.  -Toenails L hallux and R hallux debrided in length and girth without iatrogenic bleeding with sterile nail nipper and dremel.  -Patient to continue soft, supportive shoe gear daily. -Patient to report any pedal injuries to medical professional immediately. -Patient/POA to call should there be question/concern in the interim.  Return in about 3 months (around 03/20/2020) for diabetic nail trim.  Marzetta Board, DPM

## 2020-02-04 ENCOUNTER — Encounter (INDEPENDENT_AMBULATORY_CARE_PROVIDER_SITE_OTHER): Payer: Medicare HMO | Admitting: Ophthalmology

## 2020-02-25 DIAGNOSIS — E1129 Type 2 diabetes mellitus with other diabetic kidney complication: Secondary | ICD-10-CM | POA: Diagnosis not present

## 2020-02-25 DIAGNOSIS — Z940 Kidney transplant status: Secondary | ICD-10-CM | POA: Diagnosis not present

## 2020-02-26 DIAGNOSIS — I10 Essential (primary) hypertension: Secondary | ICD-10-CM | POA: Diagnosis not present

## 2020-02-26 DIAGNOSIS — E039 Hypothyroidism, unspecified: Secondary | ICD-10-CM | POA: Diagnosis not present

## 2020-02-26 DIAGNOSIS — Z940 Kidney transplant status: Secondary | ICD-10-CM | POA: Diagnosis not present

## 2020-02-26 DIAGNOSIS — E7800 Pure hypercholesterolemia, unspecified: Secondary | ICD-10-CM | POA: Diagnosis not present

## 2020-02-26 DIAGNOSIS — E1165 Type 2 diabetes mellitus with hyperglycemia: Secondary | ICD-10-CM | POA: Diagnosis not present

## 2020-03-21 ENCOUNTER — Encounter: Payer: Self-pay | Admitting: Podiatry

## 2020-03-21 ENCOUNTER — Other Ambulatory Visit: Payer: Self-pay

## 2020-03-21 ENCOUNTER — Ambulatory Visit (INDEPENDENT_AMBULATORY_CARE_PROVIDER_SITE_OTHER): Payer: Medicare HMO | Admitting: Podiatry

## 2020-03-21 DIAGNOSIS — R252 Cramp and spasm: Secondary | ICD-10-CM | POA: Diagnosis not present

## 2020-03-21 DIAGNOSIS — Z794 Long term (current) use of insulin: Secondary | ICD-10-CM

## 2020-03-21 DIAGNOSIS — B351 Tinea unguium: Secondary | ICD-10-CM | POA: Diagnosis not present

## 2020-03-21 DIAGNOSIS — M79674 Pain in right toe(s): Secondary | ICD-10-CM

## 2020-03-21 DIAGNOSIS — M79675 Pain in left toe(s): Secondary | ICD-10-CM | POA: Diagnosis not present

## 2020-03-21 DIAGNOSIS — E1122 Type 2 diabetes mellitus with diabetic chronic kidney disease: Secondary | ICD-10-CM

## 2020-03-21 DIAGNOSIS — D573 Sickle-cell trait: Secondary | ICD-10-CM | POA: Diagnosis not present

## 2020-03-22 NOTE — Progress Notes (Signed)
Subjective: [name] [name] presents today at risk foot care. Patient has history of NIDDM, s/p renal transplant, immunocompromised is seen for painful mycotic nails b/l that are difficult to trim. Pain interferes with ambulation. Aggravating factors include wearing enclosed shoe gear. Pain is relieved with periodic professional debridement  [name] Chard, MD is patient's PCP. Last visit was: 10/17/2019.  Sadly, [name] [name] states she lost her husband 3 weeks ago to colorectal cancer.   She relates spasms to right ankle on occasion. She has h/o ankle sprain. She relates no episode of trauma. She states she does not drink enough water.   Past Medical History:  Diagnosis Date  . Blood transfusion   . Chronic kidney disease    s/p transplant, kidney wasn't removed  . Gout due to renal impairment 2010   "before finding out I had kidney failure"  . History of renal transplant   . Hyperlipidemia   . Hypertension   . Sickle cell trait ([place])   . Thyroid disease   . Type II diabetes mellitus ([place])   . UTI (lower urinary tract infection) 12/08/11   "first one ever"     Current Outpatient Medications on File Prior to Visit  Medication Sig Dispense Refill  . Alcohol Swabs (ALCOHOL PREP) PADS Use as directed 5 times per day with insulin pen 600 each 11  . amLODipine (NORVASC) 5 MG tablet Take 5 mg by mouth daily.    Marland Kitchen aspirin EC 81 MG tablet Take 81 mg by mouth daily.    . cinacalcet (SENSIPAR) 30 MG tablet Take 30 mg by mouth daily.    . clotrimazole (LOTRIMIN) 1 % cream Apply to skin on both great toes once daily 45 g 1  . diazepam (VALIUM) 2 MG tablet Take 1 tablet (2 mg total) by mouth every 12 (twelve) hours as needed for muscle spasms. 20 tablet 0  . glucose blood (ONETOUCH VERIO) test strip USE TO TEST BLOOD SUGAR 4 TIMES DAILY AS DIRECTED    . Insulin Aspart (NOVOLOG FLEXPEN [place]) Inject 4 Units into the skin 3 (three) times daily. Sliding scale    . Insulin Detemir (LEVEMIR  FLEXTOUCH) 100 UNIT/ML Pen 30 units in the morning and 25 units in the evening    . levothyroxine (SYNTHROID) 75 MCG tablet TAKE 1 TABLET BY MOUTH EVERY MORNING ON AN EMPTY STOMACH 90    . lisinopril (PRINIVIL,ZESTRIL) 10 MG tablet Take by mouth.    . loperamide (IMODIUM) 2 MG capsule Take by mouth as needed for diarrhea or loose stools. One by mouth four times daily.    . metoprolol (LOPRESSOR) 50 MG tablet Take 50 mg by mouth 3 (three) times daily. 3 tabs 2 times per day    . mometasone (NASONEX) 50 MCG/ACT nasal spray Place 2 sprays into the nose daily. (Patient taking differently: Place 2 sprays into the nose daily as needed. ) 17 g 12  . mycophenolate (MYFORTIC) 180 MG EC tablet Take 180 mg by mouth 2 (two) times daily.    . NONFORMULARY OR COMPOUNDED ITEM Antifungal solution: Terbinafine 3%, Fluconazole 2%, Tea Tree Oil 5%, Urea 10%, Ibuprofen 2% in DMSO suspension #44mL 1 each 3  . omeprazole (PRILOSEC) 20 MG capsule Take 20 mg by mouth daily.    . predniSONE (DELTASONE) 5 MG tablet Take 5 mg by mouth daily with breakfast.    . simvastatin (ZOCOR) 10 MG tablet TAKE 1 TABLET BY MOUTH EVERY DAY IN THE EVENING 90 tablet 1  . tacrolimus (  PROGRAF) 1 MG capsule 7 capsules 2 times per day    . terbinafine (LAMISIL) 250 MG tablet Take 250 mg by mouth daily.     No current facility-administered medications on file prior to visit.     Allergies  Allergen Reactions  . Codeine Hives  . Acetaminophen-Codeine Rash   Objective: [name] [name] is a pleasant 64 y.o. y.o. Patient Race: Black or African American [2]  female in NAD. AAO x 3.  There were no vitals filed for this visit.  Vascular Examination: Neurovascular status unchanged b/l. Capillary fill time to digits <3 seconds b/l. Palpable DP pulses b/l. Palpable PT pulses b/l. Pedal hair absent b/l Skin temperature gradient within normal limits b/l. No edema noted b/l.  Dermatological Examination: Pedal skin with normal turgor, texture  and tone bilaterally. No open wounds bilaterally. No interdigital macerations bilaterally. Toenails L hallux and R hallux elongated, dystrophic, thickened, and crumbly with subungual debris and tenderness to dorsal palpation.  Musculoskeletal: Normal muscle strength 5/5 to all lower extremity muscle groups bilaterally. No gross bony deformities bilaterally. No pain crepitus or joint limitation noted with ROM b/l. No pain on ROM right ankle.   Neurological Examination: Protective sensation intact 5/5 intact bilaterally with 10g monofilament b/l. Vibratory sensation intact b/l.  Assessment: 1. Pain due to onychomycosis of toenails of both feet   2. Foot spasms   3. Type 2 diabetes mellitus with stage 5 chronic kidney disease not on chronic dialysis, with long-term current use of insulin ([place])    Plan: -Examined patient. -No new findings. No new orders. -Regarding right ankle spasms, we discussed adequate hydration. Will revisit to see if symptoms resolve. -Continue diabetic foot care principles. Literature dispensed on today.  -Toenails L hallux and R hallux debrided in length and girth without iatrogenic bleeding with sterile nail nipper and dremel.  -Patient to continue soft, supportive shoe gear daily. -Patient to report any pedal injuries to medical professional immediately. -Patient/POA to call should there be question/concern in the interim.  Return in about 3 months (around 06/21/2020) for diabetic nail trim.  Marzetta Board, DPM

## 2020-04-04 DIAGNOSIS — Z794 Long term (current) use of insulin: Secondary | ICD-10-CM | POA: Diagnosis not present

## 2020-04-04 DIAGNOSIS — E119 Type 2 diabetes mellitus without complications: Secondary | ICD-10-CM | POA: Diagnosis not present

## 2020-04-04 DIAGNOSIS — I10 Essential (primary) hypertension: Secondary | ICD-10-CM | POA: Diagnosis not present

## 2020-04-04 DIAGNOSIS — D84821 Immunodeficiency due to drugs: Secondary | ICD-10-CM | POA: Diagnosis not present

## 2020-04-04 DIAGNOSIS — R0683 Snoring: Secondary | ICD-10-CM | POA: Diagnosis not present

## 2020-04-04 DIAGNOSIS — N2581 Secondary hyperparathyroidism of renal origin: Secondary | ICD-10-CM | POA: Diagnosis not present

## 2020-04-04 DIAGNOSIS — K219 Gastro-esophageal reflux disease without esophagitis: Secondary | ICD-10-CM | POA: Diagnosis not present

## 2020-04-04 DIAGNOSIS — Z940 Kidney transplant status: Secondary | ICD-10-CM | POA: Diagnosis not present

## 2020-04-04 DIAGNOSIS — Z79899 Other long term (current) drug therapy: Secondary | ICD-10-CM | POA: Diagnosis not present

## 2020-04-04 DIAGNOSIS — R197 Diarrhea, unspecified: Secondary | ICD-10-CM | POA: Diagnosis not present

## 2020-04-04 DIAGNOSIS — Z792 Long term (current) use of antibiotics: Secondary | ICD-10-CM | POA: Diagnosis not present

## 2020-04-04 DIAGNOSIS — Z4822 Encounter for aftercare following kidney transplant: Secondary | ICD-10-CM | POA: Diagnosis not present

## 2020-04-08 DIAGNOSIS — I10 Essential (primary) hypertension: Secondary | ICD-10-CM | POA: Diagnosis not present

## 2020-04-08 DIAGNOSIS — E1165 Type 2 diabetes mellitus with hyperglycemia: Secondary | ICD-10-CM | POA: Diagnosis not present

## 2020-04-08 DIAGNOSIS — E039 Hypothyroidism, unspecified: Secondary | ICD-10-CM | POA: Diagnosis not present

## 2020-04-08 DIAGNOSIS — E7800 Pure hypercholesterolemia, unspecified: Secondary | ICD-10-CM | POA: Diagnosis not present

## 2020-04-16 ENCOUNTER — Other Ambulatory Visit: Payer: Self-pay | Admitting: Internal Medicine

## 2020-04-16 DIAGNOSIS — Z1231 Encounter for screening mammogram for malignant neoplasm of breast: Secondary | ICD-10-CM

## 2020-04-23 ENCOUNTER — Telehealth: Payer: Self-pay | Admitting: *Deleted

## 2020-04-23 NOTE — Chronic Care Management (AMB) (Signed)
°  Chronic Care Management   Outreach Note  04/23/2020 Name: [name] [name] MRN: [id number] DOB: 03/27/1956  [name] [name] is a 64 y.o. year old female who is a primary care patient of Glendale Chard, MD. I reached out to [name] [name] by phone today in response to a referral sent by [name] [name] health plan.     An unsuccessful telephone outreach was attempted today. The patient was referred to the case management team for assistance with care management and care coordination.   Follow Up Plan: A HIPAA compliant phone message was left for the patient providing contact information and requesting a return call. The care management team will reach out to the patient again over the next 7 days. If patient returns call to provider office, please advise to call [facility] at [phone].  [facility]

## 2020-04-27 ENCOUNTER — Encounter (HOSPITAL_COMMUNITY): Payer: Self-pay | Admitting: Emergency Medicine

## 2020-04-27 ENCOUNTER — Emergency Department (HOSPITAL_COMMUNITY)
Admission: EM | Admit: 2020-04-27 | Discharge: 2020-04-27 | Disposition: A | Discharge status: Home / Self Care | Payer: HMO | Attending: Emergency Medicine | Admitting: Emergency Medicine

## 2020-04-27 ENCOUNTER — Other Ambulatory Visit: Payer: Self-pay

## 2020-04-27 DIAGNOSIS — I129 Hypertensive chronic kidney disease with stage 1 through stage 4 chronic kidney disease, or unspecified chronic kidney disease: Secondary | ICD-10-CM | POA: Diagnosis not present

## 2020-04-27 DIAGNOSIS — R509 Fever, unspecified: Secondary | ICD-10-CM | POA: Diagnosis present

## 2020-04-27 DIAGNOSIS — Z79899 Other long term (current) drug therapy: Secondary | ICD-10-CM | POA: Insufficient documentation

## 2020-04-27 DIAGNOSIS — N189 Chronic kidney disease, unspecified: Secondary | ICD-10-CM | POA: Insufficient documentation

## 2020-04-27 DIAGNOSIS — N390 Urinary tract infection, site not specified: Secondary | ICD-10-CM | POA: Diagnosis not present

## 2020-04-27 DIAGNOSIS — R112 Nausea with vomiting, unspecified: Secondary | ICD-10-CM | POA: Insufficient documentation

## 2020-04-27 DIAGNOSIS — Z7982 Long term (current) use of aspirin: Secondary | ICD-10-CM | POA: Insufficient documentation

## 2020-04-27 DIAGNOSIS — R197 Diarrhea, unspecified: Secondary | ICD-10-CM | POA: Diagnosis not present

## 2020-04-27 DIAGNOSIS — Z794 Long term (current) use of insulin: Secondary | ICD-10-CM | POA: Diagnosis not present

## 2020-04-27 DIAGNOSIS — Z20822 Contact with and (suspected) exposure to covid-19: Secondary | ICD-10-CM | POA: Insufficient documentation

## 2020-04-27 DIAGNOSIS — E1122 Type 2 diabetes mellitus with diabetic chronic kidney disease: Secondary | ICD-10-CM | POA: Diagnosis not present

## 2020-04-27 DIAGNOSIS — Z87891 Personal history of nicotine dependence: Secondary | ICD-10-CM | POA: Diagnosis not present

## 2020-04-27 LAB — CBC
HCT: 38.4 % (ref 36.0–46.0)
Hemoglobin: 12.3 g/dL (ref 12.0–15.0)
MCH: 23.8 pg — ABNORMAL LOW (ref 26.0–34.0)
MCHC: 32 g/dL (ref 30.0–36.0)
MCV: 74.4 fL — ABNORMAL LOW (ref 80.0–100.0)
Platelets: 141 10*3/uL — ABNORMAL LOW (ref 150–400)
RBC: 5.16 MIL/uL — ABNORMAL HIGH (ref 3.87–5.11)
RDW: 14.3 % (ref 11.5–15.5)
WBC: 13.7 10*3/uL — ABNORMAL HIGH (ref 4.0–10.5)
nRBC: 0 % (ref 0.0–0.2)

## 2020-04-27 LAB — URINALYSIS, ROUTINE W REFLEX MICROSCOPIC
Bilirubin Urine: NEGATIVE
Glucose, UA: NEGATIVE mg/dL
Ketones, ur: NEGATIVE mg/dL
Nitrite: NEGATIVE
Protein, ur: 100 mg/dL — AB
Specific Gravity, Urine: 1.013 (ref 1.005–1.030)
WBC, UA: >50 WBC/hpf — ABNORMAL HIGH (ref 0–5)
pH: 5 (ref 5.0–8.0)

## 2020-04-27 LAB — SARS CORONAVIRUS 2 BY RT PCR (HOSPITAL ORDER, PERFORMED IN ~~LOC~~ HOSPITAL LAB): SARS Coronavirus 2: NEGATIVE

## 2020-04-27 LAB — COMPREHENSIVE METABOLIC PANEL
ALT: 12 U/L (ref 0–44)
AST: 11 U/L — ABNORMAL LOW (ref 15–41)
Albumin: 3.6 g/dL (ref 3.5–5.0)
Alkaline Phosphatase: 73 U/L (ref 38–126)
Anion gap: 10 (ref 5–15)
BUN: 29 mg/dL — ABNORMAL HIGH (ref 8–23)
CO2: 19 mmol/L — ABNORMAL LOW (ref 22–32)
Calcium: 9.9 mg/dL (ref 8.9–10.3)
Chloride: 104 mmol/L (ref 98–111)
Creatinine, Ser: 2.26 mg/dL — ABNORMAL HIGH (ref 0.44–1.00)
GFR calc Af Amer: 26 mL/min — ABNORMAL LOW (ref >60)
GFR calc non Af Amer: 22 mL/min — ABNORMAL LOW (ref >60)
Glucose, Bld: 247 mg/dL — ABNORMAL HIGH (ref 70–99)
Potassium: 4.1 mmol/L (ref 3.5–5.1)
Sodium: 133 mmol/L — ABNORMAL LOW (ref 135–145)
Total Bilirubin: 1.3 mg/dL — ABNORMAL HIGH (ref 0.3–1.2)
Total Protein: 7.9 g/dL (ref 6.5–8.1)

## 2020-04-27 LAB — LIPASE, BLOOD: Lipase: 32 U/L (ref 11–51)

## 2020-04-27 MED ORDER — AMOXICILLIN-POT CLAVULANATE 875-125 MG PO TABS: 1.0000 | ORAL_TABLET | Freq: Two times a day (BID) | ORAL | 0 refills | Status: DC

## 2020-04-27 MED ORDER — AMOXICILLIN-POT CLAVULANATE 875-125 MG PO TABS
1.0000 | ORAL_TABLET | Freq: Once | ORAL | Status: AC
  Administered 2020-04-27: 1 via ORAL
  Filled 2020-04-27: qty 1

## 2020-04-27 MED ORDER — ONDANSETRON 4 MG PO TBDP
8.0000 mg | ORAL_TABLET | Freq: Once | ORAL | Status: AC
  Administered 2020-04-27: 8 mg via ORAL
  Filled 2020-04-27: qty 2

## 2020-04-27 NOTE — ED Notes (Signed)
Culture sent down with urine.  

## 2020-04-27 NOTE — ED Notes (Signed)
No answer from pt in waiting room 

## 2020-04-27 NOTE — ED Notes (Signed)
Provided pt w/ water and crackers for PO challenge  

## 2020-04-27 NOTE — ED Provider Notes (Signed)
[facility] EMERGENCY DEPARTMENT Provider Note   CSN: [id number] Arrival date & time: 04/27/20  0516     History Chief Complaint  Patient presents with  . Fever  . Emesis    [name] [name] is a 64 y.o. female.  HPI She presents for evaluation of fever, chills, nausea, vomiting, diarrhea.  She began to feel ill with this illness, about a week ago.  At that time she was having sore throat and some coughing however those have resolved.  She has completed a Covid vaccine series.  She saw her nephrologist, 6 days ago at that time a urinalysis was done, but has not been reported yet.  She denies blood in emesis.  No known sick contacts.  There are no other known modifying factors.    Past Medical History:  Diagnosis Date  . Blood transfusion   . Chronic kidney disease    s/p transplant, kidney wasn't removed  . Gout due to renal impairment 2010   "before finding out I had kidney failure"  . History of renal transplant   . Hyperlipidemia   . Hypertension   . Sickle cell trait ([place])   . Thyroid disease   . Type II diabetes mellitus ([place])   . UTI (lower urinary tract infection) 12/08/11   "first one ever"    Patient Active Problem List   Diagnosis Date Noted  . DM (diabetes mellitus), type 2 with renal complications ([place]) 56/38/9373  . Norovirus 07/17/2015  . Hypercalcemia associated with chronic dialysis 05/06/2015  . Hypercholesteremia 05/06/2015  . Obesity, morbid, BMI 40.0-49.9 ([place]) 05/06/2015  . Secondary renal hyperparathyroidism ([place]) 05/06/2015  . Essential hypertension with goal blood pressure less than 140/90 04/24/2015  . Immunosuppression ([place]) 04/24/2015  . Immunosuppressive management encounter following kidney transplant 01/28/2012  . CRF (chronic renal failure) 12/08/2011  . History of renal transplant 12/08/2011  . Immunocompromised ([place]) 12/08/2011    Past Surgical History:  Procedure Laterality Date  . BREAST BIOPSY Left 08/07/2010    benign stereo  . kidney transplant  02/2009   August 2016 was second transplant  . PARATHYROIDECTOMY    . TUBAL LIGATION  1980's  . VAGINAL HYSTERECTOMY  2006     OB History   No obstetric history on file.     Family History  Problem Relation Age of Onset  . Heart disease Father   . Hypertension Mother   . Breast cancer Cousin   . Hypertension Sister   . Hypertension Daughter   . Colon cancer Neg Hx   . Esophageal cancer Neg Hx   . Rectal cancer Neg Hx   . Stomach cancer Neg Hx     Social History   Tobacco Use  . Smoking status: Former Smoker    Packs/day: 0.50    Years: 10.00    Pack years: 5.00    Types: Cigarettes    Quit date: 11/07/1988    Years since quitting: 31.4  . Smokeless tobacco: Never Used  Vaping Use  . Vaping Use: Never used  Substance Use Topics  . Alcohol use: No  . Drug use: No    Home Medications Prior to Admission medications   Medication Sig Start Date End Date Taking? Authorizing Provider  Alcohol Swabs (ALCOHOL PREP) PADS Use as directed 5 times per day with insulin pen 08/21/19   Glendale Chard, MD  amLODipine (NORVASC) 5 MG tablet Take 5 mg by mouth daily.    [provider]  amoxicillin-clavulanate (AUGMENTIN) [phone]  MG tablet Take 1 tablet by mouth 2 (two) times daily. One po bid x 7 days 04/27/20   Daleen Bo, MD  aspirin EC 81 MG tablet Take 81 mg by mouth daily.    [provider]  cinacalcet (SENSIPAR) 30 MG tablet Take 30 mg by mouth daily.    [provider]  clotrimazole (LOTRIMIN) 1 % cream Apply to skin on both great toes once daily 07/02/19   Galaway, Stephani Police, DPM  diazepam (VALIUM) 2 MG tablet Take 1 tablet (2 mg total) by mouth every 12 (twelve) hours as needed for muscle spasms. 10/17/19 10/16/20  Glendale Chard, MD  glucose blood (ONETOUCH VERIO) test strip USE TO TEST BLOOD SUGAR 4 TIMES DAILY AS DIRECTED 01/25/18   [provider]  Insulin Aspart (NOVOLOG FLEXPEN [place]) Inject 4  Units into the skin 3 (three) times daily. Sliding scale    [provider]  Insulin Detemir (LEVEMIR FLEXTOUCH) 100 UNIT/ML Pen 30 units in the morning and 25 units in the evening 03/29/18   [provider]  levothyroxine (SYNTHROID) 75 MCG tablet TAKE 1 TABLET BY MOUTH EVERY MORNING ON AN EMPTY STOMACH 90 02/07/19   [provider]  lisinopril (PRINIVIL,ZESTRIL) 10 MG tablet Take by mouth. 03/06/18   [provider]  loperamide (IMODIUM) 2 MG capsule Take by mouth as needed for diarrhea or loose stools. One by mouth four times daily.    [provider]  metoprolol (LOPRESSOR) 50 MG tablet Take 50 mg by mouth 3 (three) times daily. 3 tabs 2 times per day    [provider]  mometasone (NASONEX) 50 MCG/ACT nasal spray Place 2 sprays into the nose daily. Patient taking differently: Place 2 sprays into the nose daily as needed.  10/11/14   Orpah Greek, MD  mycophenolate (MYFORTIC) 180 MG EC tablet Take 180 mg by mouth 2 (two) times daily.    [provider]  NONFORMULARY OR COMPOUNDED ITEM Antifungal solution: Terbinafine 3%, Fluconazole 2%, Tea Tree Oil 5%, Urea 10%, Ibuprofen 2% in DMSO suspension #36mL 04/02/19   Galaway, Stephani Police, DPM  omeprazole (PRILOSEC) 20 MG capsule Take 20 mg by mouth daily.    [provider]  predniSONE (DELTASONE) 5 MG tablet Take 5 mg by mouth daily with breakfast.    [provider]  simvastatin (ZOCOR) 10 MG tablet TAKE 1 TABLET BY MOUTH EVERY DAY IN THE EVENING 10/29/19   Glendale Chard, MD  tacrolimus (PROGRAF) 1 MG capsule 7 capsules 2 times per day    [provider]  terbinafine (LAMISIL) 250 MG tablet Take 250 mg by mouth daily. 02/21/19   [provider]    Allergies    Codeine and Acetaminophen-codeine  Review of Systems   Review of Systems  All other systems reviewed and are negative.   Physical Exam Updated Vital Signs BP (!) 126/57 (BP Location:  Right Arm)   Pulse 71   Temp 98.4 F (36.9 C) (Oral)   Resp 18   Ht 5\' 2"  (1.575 m)   Wt 93.9 kg   SpO2 95%   BMI 37.86 kg/m   Physical Exam Vitals and nursing note reviewed.  Constitutional:      General: She is not in acute distress.    Appearance: She is well-developed. She is obese. She is not ill-appearing, toxic-appearing or diaphoretic.  HENT:     Head: Normocephalic and atraumatic.     Right Ear: External ear normal.  Left Ear: External ear normal.  Eyes:     Conjunctiva/sclera: Conjunctivae normal.     Pupils: Pupils are equal, round, and reactive to light.  Neck:     Trachea: Phonation normal.  Cardiovascular:     Rate and Rhythm: Normal rate and regular rhythm.     Heart sounds: Normal heart sounds.  Pulmonary:     Effort: Pulmonary effort is normal.     Breath sounds: Normal breath sounds.  Abdominal:     General: There is no distension.     Palpations: Abdomen is soft.     Tenderness: There is no abdominal tenderness.  Musculoskeletal:        General: Normal range of motion.     Cervical back: Normal range of motion and neck supple.  Skin:    General: Skin is warm and dry.  Neurological:     Mental Status: She is alert and oriented to person, place, and time.     Cranial Nerves: No cranial nerve deficit.     Sensory: No sensory deficit.     Motor: No abnormal muscle tone.     Coordination: Coordination normal.  Psychiatric:        Mood and Affect: Mood normal.        Behavior: Behavior normal.        Thought Content: Thought content normal.        Judgment: Judgment normal.     ED Results / Procedures / Treatments   Labs (all labs ordered are listed, but only abnormal results are displayed) Labs Reviewed  COMPREHENSIVE METABOLIC PANEL - Abnormal; Notable for the following components:      Result Value   Sodium 133 (*)    CO2 19 (*)    Glucose, Bld 247 (*)    BUN 29 (*)    Creatinine, Ser 2.26 (*)    AST 11 (*)    Total Bilirubin 1.3  (*)    GFR calc non Af Amer 22 (*)    GFR calc Af Amer 26 (*)    All other components within normal limits  CBC - Abnormal; Notable for the following components:   WBC 13.7 (*)    RBC 5.16 (*)    MCV 74.4 (*)    MCH 23.8 (*)    Platelets 141 (*)    All other components within normal limits  URINALYSIS, ROUTINE W REFLEX MICROSCOPIC - Abnormal; Notable for the following components:   APPearance TURBID (*)    Hgb urine dipstick SMALL (*)    Protein, ur 100 (*)    Leukocytes,Ua LARGE (*)    WBC, UA >50 (*)    Bacteria, UA MANY (*)    All other components within normal limits  SARS CORONAVIRUS 2 BY RT PCR (HOSPITAL ORDER, [facility])  URINE CULTURE  LIPASE, BLOOD    EKG None  Radiology No results found.  Procedures Procedures (including critical care time)  Medications Ordered in ED Medications  ondansetron (ZOFRAN-ODT) disintegrating tablet 8 mg (8 mg Oral Given 04/27/20 1228)  amoxicillin-clavulanate (AUGMENTIN) 875-125 MG per tablet 1 tablet (1 tablet Oral Given 04/27/20 1326)    ED Course  I have reviewed the triage vital signs and the nursing notes.  Pertinent labs & imaging results that were available during my care of the patient were reviewed by me and considered in my medical decision making (see chart for details).    MDM Rules/Calculators/A&P  Patient Vitals for the past 24 hrs:  BP Temp Temp src Pulse Resp SpO2 Height Weight  04/27/20 1325 (!) 126/57 98.4 F (36.9 C) Oral 71 18 95 % -- --  04/27/20 1300 140/70 -- -- 72 -- 97 % -- --  04/27/20 1230 (!) 148/70 -- -- 72 17 99 % -- --  04/27/20 1215 129/64 -- -- 70 -- 98 % -- --  04/27/20 1037 116/67 -- -- 72 16 97 % -- --  04/27/20 0811 132/64 99.1 F (37.3 C) Oral 86 16 100 % -- --  04/27/20 0526 (!) 174/83 99.9 F (37.7 C) Oral 97 18 100 % 5\' 2"  (1.575 m) 93.9 kg    1:22 PM Reevaluation with update and discussion. After initial assessment and  treatment, an updated evaluation reveals she is tolerating liquids and crackers, and has no further complaints.  Findings discussed and questions answered.Daleen Bo   Medical Decision Making:  This patient is presenting for evaluation of illness for 1 week characterized primarily by fever, chills and achiness, which does require a range of treatment options, and is a complaint that involves a high risk of morbidity and mortality. The differential diagnoses include viral illness, bacterial illness, metabolic instability. I decided to review old records, and in summary patient with chronic renal insufficiency presenting for illness for 1 week, without defining characteristics.  I did not require additional historical information from anyone.  Clinical Laboratory Tests Ordered, included CBC, Metabolic panel and Urinalysis. Review indicates findings consistent with UTI including mild white count elevation, and chronic renal insufficiency..  Critical Interventions-clinical evaluation, laboratory testing, Zofran, Augmentin, observation reassessment  After These Interventions, the Patient was reevaluated and was found improved and stable for discharge.  CRITICAL CARE-no Performed by: Daleen Bo  Nursing Notes Reviewed/ Care Coordinated Applicable Imaging Reviewed Interpretation of Laboratory Data incorporated into ED treatment  The patient appears reasonably screened and/or stabilized for discharge and I doubt any other medical condition or other [facility] requiring further screening, evaluation, or treatment in the ED at this time prior to discharge.  Plan: Home Medications-continue usual; Home Treatments-rest, fluids; return here if the recommended treatment, does not improve the symptoms; Recommended follow up-PCP 1 week and as needed     Final Clinical Impression(s) / ED Diagnoses Final diagnoses:  Lower urinary tract infectious disease    Rx / DC Orders ED Discharge Orders          Ordered    amoxicillin-clavulanate (AUGMENTIN) 875-125 MG tablet  2 times daily        04/27/20 1335           Daleen Bo, MD 04/27/20 1337

## 2020-04-27 NOTE — ED Notes (Signed)
Pt tolerated PO challenge well.

## 2020-04-27 NOTE — ED Triage Notes (Signed)
Patient with history of fever, nausea and vomiting for the last day.  Patient denies any covid exposures and states she has received her vaccines.  Patient is an ex-dialysis patient, kidney transplant.  She does have some abdominal pain with the nausea and vomiting.

## 2020-04-27 NOTE — ED Notes (Signed)
Reviewed discharge instructions with patient. Follow-up care and medications reviewed. Patient verbalized understanding. Patient A&Ox4, VSS upon discharge. 

## 2020-04-27 NOTE — Discharge Instructions (Signed)
The testing indicates you have a urinary tract infection.  Start taking the antibiotic prescription was sent to your pharmacy, this evening.  Take Tylenol every 4 hours for fever or chills.  Sure you follow-up with your doctor for checkup in a week or so to ensure that the urine infection has resolved.  See her sooner if needed, for problems.

## 2020-04-30 LAB — URINE CULTURE: Culture: >=100000 — AB

## 2020-04-30 NOTE — Chronic Care Management (AMB) (Signed)
  Chronic Care Management   Note  04/30/2020 Name: [name] [name] MRN: [id number] DOB: [birth year]  [name] [name] is a 64 y.o. year old female who is a primary care patient of Glendale Chard, MD. I reached out to Tia Masker by phone today in response to a referral sent by Ms. [facility] health plan.     Ms. Crocket was given information about Chronic Care Management services today including:  1. CCM service includes personalized support from designated clinical staff supervised by her physician, including individualized plan of care and coordination with other care providers 2. 24/7 contact phone numbers for assistance for urgent and routine care needs. 3. Service will only be billed when office clinical staff spend 20 minutes or more in a month to coordinate care. 4. Only one practitioner may furnish and bill the service in a calendar month. 5. The patient may stop CCM services at any time (effective at the end of the month) by phone call to the office staff. 6. The patient will be responsible for cost sharing (co-pay) of up to 20% of the service fee (after annual deductible is met).  Patient agreed to services and verbal consent obtained.   Follow up plan: Telephone appointment with care management team member scheduled for:05/15/2020  [facility]

## 2020-05-01 ENCOUNTER — Ambulatory Visit: Payer: Medicare HMO

## 2020-05-01 ENCOUNTER — Encounter: Payer: Medicare HMO | Admitting: Internal Medicine

## 2020-05-02 ENCOUNTER — Telehealth: Payer: Self-pay

## 2020-05-02 NOTE — Telephone Encounter (Signed)
Yes, that is perfect!

## 2020-05-02 NOTE — Telephone Encounter (Signed)
The pt was notified that Dr. Baird Cancer wanted to check and see if the pt is better because the pt recently had an ER visit.  The pt said that she feels better.  The pt said she had a UTI she will finish her last antibiotic today and that she is supposed to f/u with her primary to make sure the UTI is gone. The pt has an appt on 10/06 for a physical and AWV and would like to know if she can wait until then to get her urine rechecked.

## 2020-05-02 NOTE — Telephone Encounter (Signed)
The pt was notified that Dr. Baird Cancer said it's fine that the pt f/u at her next 10/04

## 2020-05-08 ENCOUNTER — Other Ambulatory Visit: Payer: Self-pay

## 2020-05-08 ENCOUNTER — Ambulatory Visit (INDEPENDENT_AMBULATORY_CARE_PROVIDER_SITE_OTHER): Payer: HMO | Admitting: Ophthalmology

## 2020-05-08 ENCOUNTER — Encounter (INDEPENDENT_AMBULATORY_CARE_PROVIDER_SITE_OTHER): Payer: Self-pay | Admitting: Ophthalmology

## 2020-05-08 DIAGNOSIS — H43812 Vitreous degeneration, left eye: Secondary | ICD-10-CM | POA: Insufficient documentation

## 2020-05-08 DIAGNOSIS — Z794 Long term (current) use of insulin: Secondary | ICD-10-CM

## 2020-05-08 DIAGNOSIS — H43391 Other vitreous opacities, right eye: Secondary | ICD-10-CM

## 2020-05-08 DIAGNOSIS — Z961 Presence of intraocular lens: Secondary | ICD-10-CM

## 2020-05-08 DIAGNOSIS — H43811 Vitreous degeneration, right eye: Secondary | ICD-10-CM | POA: Diagnosis not present

## 2020-05-08 DIAGNOSIS — N185 Chronic kidney disease, stage 5: Secondary | ICD-10-CM

## 2020-05-08 DIAGNOSIS — E1122 Type 2 diabetes mellitus with diabetic chronic kidney disease: Secondary | ICD-10-CM

## 2020-05-08 NOTE — Assessment & Plan Note (Signed)
No diabetic retinopathy

## 2020-05-08 NOTE — Assessment & Plan Note (Signed)

## 2020-05-08 NOTE — Progress Notes (Signed)
05/08/2020     CHIEF COMPLAINT Patient presents for Retina Follow Up   HISTORY OF PRESENT ILLNESS: [name] [name] is a 64 y.o. female who presents to the clinic today for:   HPI    Retina Follow Up    Patient presents with  PVD.  In both eyes.  Severity is moderate.  Duration of 9 months.  Since onset it is stable.  I, the attending physician,  performed the HPI with the patient and updated documentation appropriately.          Comments    9 Month PVD f\u OU. OCT  Pt c/o floaters in OU. Pt states she sees a black dot and a white shadow that float around.  BGL: 169 A1C: 9.3       Last edited by Tilda Franco on 05/08/2020  9:25 AM. (History)      Referring physician: Glendale Chard, MD [address] [place],  [place] [address]  HISTORICAL INFORMATION:   Selected notes from the MEDICAL RECORD NUMBER    Lab Results  Component Value Date   HGBA1C 8.3 03/17/2017     CURRENT MEDICATIONS: No current outpatient medications on file. (Ophthalmic Drugs)   No current facility-administered medications for this visit. (Ophthalmic Drugs)   Current Outpatient Medications (Other)  Medication Sig  . Alcohol Swabs (ALCOHOL PREP) PADS Use as directed 5 times per day with insulin pen  . amLODipine (NORVASC) 5 MG tablet Take 5 mg by mouth daily.  Marland Kitchen amoxicillin-clavulanate (AUGMENTIN) 875-125 MG tablet Take 1 tablet by mouth 2 (two) times daily. One po bid x 7 days  . aspirin EC 81 MG tablet Take 81 mg by mouth daily.  . cinacalcet (SENSIPAR) 30 MG tablet Take 30 mg by mouth daily.  . clotrimazole (LOTRIMIN) 1 % cream Apply to skin on both great toes once daily  . diazepam (VALIUM) 2 MG tablet Take 1 tablet (2 mg total) by mouth every 12 (twelve) hours as needed for muscle spasms.  Marland Kitchen glucose blood (ONETOUCH VERIO) test strip USE TO TEST BLOOD SUGAR 4 TIMES DAILY AS DIRECTED  . Insulin Aspart (NOVOLOG FLEXPEN [place]) Inject 4 Units into the skin 3 (three) times daily.  Sliding scale  . Insulin Detemir (LEVEMIR FLEXTOUCH) 100 UNIT/ML Pen 30 units in the morning and 25 units in the evening  . levothyroxine (SYNTHROID) 75 MCG tablet TAKE 1 TABLET BY MOUTH EVERY MORNING ON AN EMPTY STOMACH 90  . lisinopril (PRINIVIL,ZESTRIL) 10 MG tablet Take by mouth.  . loperamide (IMODIUM) 2 MG capsule Take by mouth as needed for diarrhea or loose stools. One by mouth four times daily.  . metoprolol (LOPRESSOR) 50 MG tablet Take 50 mg by mouth 3 (three) times daily. 3 tabs 2 times per day  . mometasone (NASONEX) 50 MCG/ACT nasal spray Place 2 sprays into the nose daily. (Patient taking differently: Place 2 sprays into the nose daily as needed. )  . mycophenolate (MYFORTIC) 180 MG EC tablet Take 180 mg by mouth 2 (two) times daily.  . NONFORMULARY OR COMPOUNDED ITEM Antifungal solution: Terbinafine 3%, Fluconazole 2%, Tea Tree Oil 5%, Urea 10%, Ibuprofen 2% in DMSO suspension #70mL  . omeprazole (PRILOSEC) 20 MG capsule Take 20 mg by mouth daily.  . predniSONE (DELTASONE) 5 MG tablet Take 5 mg by mouth daily with breakfast.  . simvastatin (ZOCOR) 10 MG tablet TAKE 1 TABLET BY MOUTH EVERY DAY IN THE EVENING  . tacrolimus (PROGRAF) 1 MG capsule 7  capsules 2 times per day  . terbinafine (LAMISIL) 250 MG tablet Take 250 mg by mouth daily.   No current facility-administered medications for this visit. (Other)      REVIEW OF SYSTEMS: ROS    Positive for: Endocrine   Last edited by Tilda Franco on 05/08/2020  9:25 AM. (History)       ALLERGIES Allergies  Allergen Reactions  . Codeine Hives  . Acetaminophen-Codeine Rash    PAST MEDICAL HISTORY Past Medical History:  Diagnosis Date  . Blood transfusion   . Chronic kidney disease    s/p transplant, kidney wasn't removed  . Gout due to renal impairment 2010   "before finding out I had kidney failure"  . History of renal transplant   . Hyperlipidemia   . Hypertension   . Sickle cell trait ([place])   . Thyroid  disease   . Type II diabetes mellitus ([place])   . UTI (lower urinary tract infection) 12/08/11   "first one ever"   Past Surgical History:  Procedure Laterality Date  . BREAST BIOPSY Left 08/07/2010   benign stereo  . kidney transplant  02/2009   August 2016 was second transplant  . PARATHYROIDECTOMY    . TUBAL LIGATION  1980's  . VAGINAL HYSTERECTOMY  2006    FAMILY HISTORY Family History  Problem Relation Age of Onset  . Heart disease Father   . Hypertension Mother   . Breast cancer Cousin   . Hypertension Sister   . Hypertension Daughter   . Colon cancer Neg Hx   . Esophageal cancer Neg Hx   . Rectal cancer Neg Hx   . Stomach cancer Neg Hx     SOCIAL HISTORY Social History   Tobacco Use  . Smoking status: Former Smoker    Packs/day: 0.50    Years: 10.00    Pack years: 5.00    Types: Cigarettes    Quit date: 11/07/1988    Years since quitting: 31.5  . Smokeless tobacco: Never Used  Vaping Use  . Vaping Use: Never used  Substance Use Topics  . Alcohol use: No  . Drug use: No         OPHTHALMIC EXAM:  Base Eye Exam    Visual Acuity (Snellen - Linear)      Right Left   Dist [place] 20/20 20/25 +       Tonometry (Tonopen, 9:28 AM)      Right Left   Pressure 14 14       Pupils      Pupils Dark Light Shape React APD   Right PERRL 3 2 Round Slow None   Left PERRL 3 2 Round Slow None       Visual Fields (Counting fingers)      Left Right    Full Full       Neuro/Psych    Oriented x3: Yes   Mood/Affect: Normal       Dilation    Both eyes: 1.0% Mydriacyl, 2.5% Phenylephrine @ 9:28 AM        Slit Lamp and Fundus Exam    External Exam      Right Left   External Normal Normal       Slit Lamp Exam      Right Left   Lids/Lashes Normal Normal   Conjunctiva/Sclera White and quiet White and quiet   Cornea Clear Clear   Anterior Chamber Deep and quiet Deep and quiet   Iris Round and  reactive Round and reactive   Lens Centered posterior chamber  intraocular lens Centered posterior chamber intraocular lens   Anterior Vitreous Normal Normal       Fundus Exam      Right Left   Posterior Vitreous Posterior vitreous detachment Posterior vitreous detachment   Disc Normal Normal   C/D Ratio 0.25 0.25   Macula Normal Normal   Vessels Normal,, no DR Normal,, no DR   Periphery Normal Normal, old pigmented scar inferiorly, 6 meridian, no breaks          IMAGING AND PROCEDURES  Imaging and Procedures for 05/08/20  OCT, Retina - OU - Both Eyes       Right Eye Quality was good. Scan locations included subfoveal. Central Foveal Thickness: 233. Progression has been stable. Findings include normal foveal contour.   Left Eye Quality was good. Scan locations included subfoveal. Central Foveal Thickness: 237. Progression has been stable. Findings include normal foveal contour.   Notes Bilateral posterior vitreous detachment                ASSESSMENT/PLAN:  DM (diabetes mellitus), type 2 with renal complications (HCC) No diabetic retinopathy  Posterior vitreous detachment of left eye   The nature of posterior vitreous detachment was discussed with the patient as well as its physiology, its age prevalence, and its possible implication regarding retinal breaks and detachment.  An informational brochure was given to the patient.  All the patient's questions were answered.  The patient was asked to return if new or different flashes or floaters develops.   Patient was instructed to contact office immediately if any changes were noticed. I explained to the patient that vitreous inside the eye is similar to jello inside a bowl. As the jello melts it can start to pull away from the bowl, similarly the vitreous throughout our lives can begin to pull away from the retina. That process is called a posterior vitreous detachment. In some cases, the vitreous can tug hard enough on the retina to form a retinal tear. I discussed with the patient  the signs and symptoms of a retinal detachment.  Do not rub the eye.      ICD-10-CM   1. Other vitreous opacities, right eye  H43.391   2. Posterior vitreous detachment of left eye  H43.812 OCT, Retina - OU - Both Eyes  3. Posterior vitreous detachment of right eye  H43.811 OCT, Retina - OU - Both Eyes  4. Pseudophakia, both eyes  Z96.1   5. Type 2 diabetes mellitus with stage 5 chronic kidney disease not on chronic dialysis, with long-term current use of insulin (HCC)  E11.22    N18.5    Z79.4     1.  Follow-up with Dr. Nathanial Rancher as scheduled, at least annually  2.  No detectable diabetic retinopathy in either eye.  3.  No active retinopathy.  Ophthalmic Meds Ordered this visit:  No orders of the defined types were placed in this encounter.      Return in about 2 years (around 05/08/2022) for DILATE OU, COLOR FP.  There are no Patient Instructions on file for this visit.   Explained the diagnoses, plan, and follow up with the patient and they expressed understanding.  Patient expressed understanding of the importance of proper follow up care.   Clent Demark Rhylynn Perdomo M.D. Diseases & Surgery of the Retina and Vitreous [facility] [place] 05/08/20     Abbreviations: M myopia (nearsighted); A astigmatism;  H hyperopia (farsighted); P presbyopia; Mrx spectacle prescription;  CTL contact lenses; OD right eye; OS left eye; OU both eyes  XT exotropia; ET esotropia; PEK punctate epithelial keratitis; PEE punctate epithelial erosions; DES dry eye syndrome; MGD meibomian gland dysfunction; ATs artificial tears; PFAT's preservative free artificial tears; [place] nuclear sclerotic cataract; PSC posterior subcapsular cataract; ERM epi-retinal membrane; PVD posterior vitreous detachment; RD retinal detachment; DM diabetes mellitus; DR diabetic retinopathy; NPDR non-proliferative diabetic retinopathy; PDR proliferative diabetic retinopathy; CSME clinically significant macular edema; DME  diabetic macular edema; dbh dot blot hemorrhages; CWS cotton wool spot; POAG primary open angle glaucoma; C/D cup-to-disc ratio; HVF humphrey visual field; GVF goldmann visual field; OCT optical coherence tomography; IOP intraocular pressure; BRVO Branch retinal vein occlusion; CRVO central retinal vein occlusion; CRAO central retinal artery occlusion; BRAO branch retinal artery occlusion; RT retinal tear; SB scleral buckle; PPV pars plana vitrectomy; VH Vitreous hemorrhage; PRP panretinal laser photocoagulation; IVK intravitreal kenalog; VMT vitreomacular traction; MH Macular hole;  NVD neovascularization of the disc; NVE neovascularization elsewhere; AREDS age related eye disease study; ARMD age related macular degeneration; POAG primary open angle glaucoma; EBMD epithelial/anterior basement membrane dystrophy; ACIOL anterior chamber intraocular lens; IOL intraocular lens; PCIOL posterior chamber intraocular lens; Phaco/IOL phacoemulsification with intraocular lens placement; [place] photorefractive keratectomy; LASIK laser assisted in situ keratomileusis; HTN hypertension; DM diabetes mellitus; COPD chronic obstructive pulmonary disease

## 2020-05-13 ENCOUNTER — Telehealth: Payer: Self-pay

## 2020-05-13 NOTE — Chronic Care Management (AMB) (Signed)
Chronic Care Management Pharmacy Assistant    Name: [name] [name]  MRN: [id number] DOB: 01-10-1956   Reason for Encounter: Medication Review - Initial Questions.  Patient Questions:  Have you seen any other providers since your last visit?   05/08/2020 - Deloria Lair, MD Ophthalmology - Return in about 2 years. 04/27/2020 ED - Daleen Bo, MD [place] Urinary tract infectious disease. 03/21/2020 - Acquanetta Sit , DPM - Podiatry   Any changes in your medications or health? No  Any side effects from any medications? No  Do you have an symptoms or problems not managed by your medications? No.  Any concerns about your health right now? Yes. Patient states she would like to get her blood sugars under control.  Has your provider asked that you check blood pressure, blood sugar, or follow special diet at home? Yes, Patient states she takes her  blood sugars three times a day . Patient states she takes her blood pressure twice a day.  Do you get any type of exercise on a regular basis? No.   Can you think of a goal you would like to reach for your health? Patient states she would like to lose weight and her get her  A 1- C to go down.  Do you have any problems getting your medications? No. Patient states she get most of her medications at [facility], states she has new insurance and they are working on mailing the rest of medications.  Is there anything that you would like to discuss during the appointment? Patient would like to work on getting healthier.  Patient is aware to have  medications and supplements at time of phone appointment.    PCP : Glendale Chard, MD  Allergies:   Allergies  Allergen Reactions  . Codeine Hives  . Acetaminophen-Codeine Rash    Medications: Outpatient Encounter Medications as of 05/13/2020  Medication Sig Note  . Alcohol Swabs (ALCOHOL PREP) PADS Use as directed 5 times per day with insulin pen   .  amLODipine (NORVASC) 5 MG tablet Take 5 mg by mouth daily.   Marland Kitchen amoxicillin-clavulanate (AUGMENTIN) 875-125 MG tablet Take 1 tablet by mouth 2 (two) times daily. One po bid x 7 days   . aspirin EC 81 MG tablet Take 81 mg by mouth daily.   . cinacalcet (SENSIPAR) 30 MG tablet Take 30 mg by mouth daily.   . clotrimazole (LOTRIMIN) 1 % cream Apply to skin on both great toes once daily   . diazepam (VALIUM) 2 MG tablet Take 1 tablet (2 mg total) by mouth every 12 (twelve) hours as needed for muscle spasms.   Marland Kitchen glucose blood (ONETOUCH VERIO) test strip USE TO TEST BLOOD SUGAR 4 TIMES DAILY AS DIRECTED   . Insulin Aspart (NOVOLOG FLEXPEN [place]) Inject 4 Units into the skin 3 (three) times daily. Sliding scale 11/16/2017: Pt reports 4 units, 3 times daily   . Insulin Detemir (LEVEMIR FLEXTOUCH) 100 UNIT/ML Pen 30 units in the morning and 25 units in the evening   . levothyroxine (SYNTHROID) 75 MCG tablet TAKE 1 TABLET BY MOUTH EVERY MORNING ON AN EMPTY STOMACH 90   . lisinopril (PRINIVIL,ZESTRIL) 10 MG tablet Take by mouth.   . loperamide (IMODIUM) 2 MG capsule Take by mouth as needed for diarrhea or loose stools. One by mouth four times daily.   . metoprolol (LOPRESSOR) 50 MG tablet Take 50 mg by mouth 3 (three) times daily. 3 tabs 2 times per  day   . mometasone (NASONEX) 50 MCG/ACT nasal spray Place 2 sprays into the nose daily. (Patient taking differently: Place 2 sprays into the nose daily as needed. )   . mycophenolate (MYFORTIC) 180 MG EC tablet Take 180 mg by mouth 2 (two) times daily.   . NONFORMULARY OR COMPOUNDED ITEM Antifungal solution: Terbinafine 3%, Fluconazole 2%, Tea Tree Oil 5%, Urea 10%, Ibuprofen 2% in DMSO suspension #43mL   . omeprazole (PRILOSEC) 20 MG capsule Take 20 mg by mouth daily.   . predniSONE (DELTASONE) 5 MG tablet Take 5 mg by mouth daily with breakfast.   . simvastatin (ZOCOR) 10 MG tablet TAKE 1 TABLET BY MOUTH EVERY DAY IN THE EVENING   . tacrolimus (PROGRAF) 1 MG capsule  7 capsules 2 times per day 11/16/2017: Pt reports taking 7 in the morning and 6 at night.   . terbinafine (LAMISIL) 250 MG tablet Take 250 mg by mouth daily.    No facility-administered encounter medications on file as of 05/13/2020.    Current Diagnosis: Patient Active Problem List   Diagnosis Date Noted  . Other vitreous opacities, right eye 05/08/2020  . Posterior vitreous detachment of left eye 05/08/2020  . Posterior vitreous detachment of right eye 05/08/2020  . Pseudophakia, both eyes 05/08/2020  . DM (diabetes mellitus), type 2 with renal complications ([place]) 78/24/2353  . Norovirus 07/17/2015  . Hypercalcemia associated with chronic dialysis 05/06/2015  . Hypercholesteremia 05/06/2015  . Obesity, morbid, BMI 40.0-49.9 ([place]) 05/06/2015  . Secondary renal hyperparathyroidism ([place]) 05/06/2015  . Essential hypertension with goal blood pressure less than 140/90 04/24/2015  . Immunosuppression ([place]) 04/24/2015  . Immunosuppressive management encounter following kidney transplant 01/28/2012  . CRF (chronic renal failure) 12/08/2011  . History of renal transplant 12/08/2011  . Immunocompromised ([place]) 12/08/2011      Follow-Up:  Pharmacist Review- Patient states her husband passed away  two months ago, states that she has been having issues paying for her kidney medication  (Tacrolimus ) she states she has a assistance form to help with the cost, but she does not understand how to fill out.   Jannette Fogo, CPP notified   Judithann Sheen, [facility] Clinical Pharmacist Assistant [phone]

## 2020-05-14 ENCOUNTER — Encounter: Payer: Self-pay | Admitting: Internal Medicine

## 2020-05-14 ENCOUNTER — Ambulatory Visit: Payer: Medicare HMO

## 2020-05-14 ENCOUNTER — Other Ambulatory Visit: Payer: Self-pay

## 2020-05-14 ENCOUNTER — Ambulatory Visit (INDEPENDENT_AMBULATORY_CARE_PROVIDER_SITE_OTHER): Payer: HMO | Admitting: Internal Medicine

## 2020-05-14 VITALS — BP 138/78 | HR 73 | Temp 98.0°F | Ht 60.6 in | Wt 204.8 lb

## 2020-05-14 VITALS — BP 138/78 | HR 73 | Temp 98.0°F | Ht 60.6 in | Wt 204.0 lb

## 2020-05-14 DIAGNOSIS — I129 Hypertensive chronic kidney disease with stage 1 through stage 4 chronic kidney disease, or unspecified chronic kidney disease: Secondary | ICD-10-CM | POA: Diagnosis not present

## 2020-05-14 DIAGNOSIS — E559 Vitamin D deficiency, unspecified: Secondary | ICD-10-CM

## 2020-05-14 DIAGNOSIS — R8290 Unspecified abnormal findings in urine: Secondary | ICD-10-CM | POA: Diagnosis not present

## 2020-05-14 DIAGNOSIS — Z23 Encounter for immunization: Secondary | ICD-10-CM

## 2020-05-14 DIAGNOSIS — Z794 Long term (current) use of insulin: Secondary | ICD-10-CM | POA: Diagnosis not present

## 2020-05-14 DIAGNOSIS — N183 Chronic kidney disease, stage 3 unspecified: Secondary | ICD-10-CM | POA: Diagnosis not present

## 2020-05-14 DIAGNOSIS — Z0000 Encounter for general adult medical examination without abnormal findings: Secondary | ICD-10-CM | POA: Diagnosis not present

## 2020-05-14 DIAGNOSIS — E1122 Type 2 diabetes mellitus with diabetic chronic kidney disease: Secondary | ICD-10-CM

## 2020-05-14 DIAGNOSIS — Z6839 Body mass index (BMI) 39.0-39.9, adult: Secondary | ICD-10-CM

## 2020-05-14 LAB — POCT URINALYSIS DIPSTICK
Bilirubin, UA: NEGATIVE
Glucose, UA: NEGATIVE
Ketones, UA: NEGATIVE
Nitrite, UA: NEGATIVE
Protein, UA: POSITIVE — AB
Spec Grav, UA: 1.02 (ref 1.010–1.025)
Urobilinogen, UA: 0.2 E.U./dL
pH, UA: 5.5 (ref 5.0–8.0)

## 2020-05-14 LAB — POCT UA - MICROALBUMIN
Albumin/Creatinine Ratio, Urine, POC: >300
Creatinine, POC: 50 mg/dL
Microalbumin Ur, POC: 150 mg/L

## 2020-05-14 MED ORDER — BOOSTRIX 5-2.5-18.5 LF-MCG/0.5 IM SUSP: 0.5000 mL | Freq: Once | INTRAMUSCULAR | 0 refills | Status: AC

## 2020-05-14 NOTE — Patient Instructions (Signed)
[name] , Thank you for taking time to come for your Medicare Wellness Visit. I appreciate your ongoing commitment to your health goals. Please review the following plan we discussed and let me know if I can assist you in the future.   Screening recommendations/referrals: Colonoscopy: completed 02/01/2012 Mammogram: scheduled for the 25th Bone Density: n/a Recommended yearly ophthalmology/optometry visit for glaucoma screening and checkup Recommended yearly dental visit for hygiene and checkup  Vaccinations: Influenza vaccine: completed 04/21/2020 Pneumococcal vaccine: completed 08/21/2018 Tdap vaccine: due Shingles vaccine: discussed  Covid-19:  11/15/2019, 10/25/2019  Advanced directives: Advance directive discussed with you today. Even though you declined this today please call our office should you change your mind and we can give you the proper paperwork for you to fill out.  Conditions/risks identified: none  Next appointment: Follow up in one year for your annual wellness visit. 05/28/2021 at 2:00  Preventive Care 40-64 Years, Female Preventive care refers to lifestyle choices and visits with your health care provider that can promote health and wellness. What does preventive care include?  A yearly physical exam. This is also called an annual well check.  Dental exams once or twice a year.  Routine eye exams. Ask your health care provider how often you should have your eyes checked.  Personal lifestyle choices, including:  Daily care of your teeth and gums.  Regular physical activity.  Eating a healthy diet.  Avoiding tobacco and drug use.  Limiting alcohol use.  Practicing safe sex.  Taking low-dose aspirin daily starting at age 74.  Taking vitamin and mineral supplements as recommended by your health care provider. What happens during an annual well check? The services and screenings done by your health care provider during your annual well check will  depend on your age, overall health, lifestyle risk factors, and family history of disease. Counseling  Your health care provider may ask you questions about your:  Alcohol use.  Tobacco use.  Drug use.  Emotional well-being.  Home and relationship well-being.  Sexual activity.  Eating habits.  Work and work Statistician.  Method of birth control.  Menstrual cycle.  Pregnancy history. Screening  You may have the following tests or measurements:  Height, weight, and BMI.  Blood pressure.  Lipid and cholesterol levels. These may be checked every 5 years, or more frequently if you are over 40 years old.  Skin check.  Lung cancer screening. You may have this screening every year starting at age 37 if you have a 30-pack-year history of smoking and currently smoke or have quit within the past 15 years.  Fecal occult blood test (FOBT) of the stool. You may have this test every year starting at age 25.  Flexible sigmoidoscopy or colonoscopy. You may have a sigmoidoscopy every 5 years or a colonoscopy every 10 years starting at age 72.  Hepatitis C blood test.  Hepatitis B blood test.  Sexually transmitted disease (STD) testing.  Diabetes screening. This is done by checking your blood sugar (glucose) after you have not eaten for a while (fasting). You may have this done every 1-3 years.  Mammogram. This may be done every 1-2 years. Talk to your health care provider about when you should start having regular mammograms. This may depend on whether you have a family history of breast cancer.  BRCA-related cancer screening. This may be done if you have a family history of breast, ovarian, tubal, or peritoneal cancers.  Pelvic exam and Pap test. This may be done  every 3 years starting at age 48. Starting at age 54, this may be done every 5 years if you have a Pap test in combination with an HPV test.  Bone density scan. This is done to screen for osteoporosis. You may have  this scan if you are at high risk for osteoporosis. Discuss your test results, treatment options, and if necessary, the need for more tests with your health care provider. Vaccines  Your health care provider may recommend certain vaccines, such as:  Influenza vaccine. This is recommended every year.  Tetanus, diphtheria, and acellular pertussis (Tdap, Td) vaccine. You may need a Td booster every 10 years.  Zoster vaccine. You may need this after age 71.  Pneumococcal 13-valent conjugate (PCV13) vaccine. You may need this if you have certain conditions and were not previously vaccinated.  Pneumococcal polysaccharide (PPSV23) vaccine. You may need one or two doses if you smoke cigarettes or if you have certain conditions. Talk to your health care provider about which screenings and vaccines you need and how often you need them. This information is not intended to replace advice given to you by your health care provider. Make sure you discuss any questions you have with your health care provider. Document Released: 08/22/2015 Document Revised: 04/14/2016 Document Reviewed: 05/27/2015 Elsevier Interactive Patient Education  2017 [employer] Prevention in the Home Falls can cause injuries. They can happen to people of all ages. There are many things you can do to make your home safe and to help prevent falls. What can I do on the outside of my home?  Regularly fix the edges of walkways and driveways and fix any cracks.  Remove anything that might make you trip as you walk through a door, such as a raised step or threshold.  Trim any bushes or trees on the path to your home.  Use bright outdoor lighting.  Clear any walking paths of anything that might make someone trip, such as rocks or tools.  Regularly check to see if handrails are loose or broken. Make sure that both sides of any steps have handrails.  Any raised decks and porches should have guardrails on the  edges.  Have any leaves, snow, or ice cleared regularly.  Use sand or salt on walking paths during winter.  Clean up any spills in your garage right away. This includes oil or grease spills. What can I do in the bathroom?  Use night lights.  Install grab bars by the toilet and in the tub and shower. Do not use towel bars as grab bars.  Use non-skid mats or decals in the tub or shower.  If you need to sit down in the shower, use a plastic, non-slip stool.  Keep the floor dry. Clean up any water that spills on the floor as soon as it happens.  Remove soap buildup in the tub or shower regularly.  Attach bath mats securely with double-sided non-slip rug tape.  Do not have throw rugs and other things on the floor that can make you trip. What can I do in the bedroom?  Use night lights.  Make sure that you have a light by your bed that is easy to reach.  Do not use any sheets or blankets that are too big for your bed. They should not hang down onto the floor.  Have a firm chair that has side arms. You can use this for support while you get dressed.  Do not have  throw rugs and other things on the floor that can make you trip. What can I do in the kitchen?  Clean up any spills right away.  Avoid walking on wet floors.  Keep items that you use a lot in easy-to-reach places.  If you need to reach something above you, use a strong step stool that has a grab bar.  Keep electrical cords out of the way.  Do not use floor polish or wax that makes floors slippery. If you must use wax, use non-skid floor wax.  Do not have throw rugs and other things on the floor that can make you trip. What can I do with my stairs?  Do not leave any items on the stairs.  Make sure that there are handrails on both sides of the stairs and use them. Fix handrails that are broken or loose. Make sure that handrails are as long as the stairways.  Check any carpeting to make sure that it is firmly  attached to the stairs. Fix any carpet that is loose or worn.  Avoid having throw rugs at the top or bottom of the stairs. If you do have throw rugs, attach them to the floor with carpet tape.  Make sure that you have a light switch at the top of the stairs and the bottom of the stairs. If you do not have them, ask someone to add them for you. What else can I do to help prevent falls?  Wear shoes that:  Do not have high heels.  Have rubber bottoms.  Are comfortable and fit you well.  Are closed at the toe. Do not wear sandals.  If you use a stepladder:  Make sure that it is fully opened. Do not climb a closed stepladder.  Make sure that both sides of the stepladder are locked into place.  Ask someone to hold it for you, if possible.  Clearly mark and make sure that you can see:  Any grab bars or handrails.  First and last steps.  Where the edge of each step is.  Use tools that help you move around (mobility aids) if they are needed. These include:  Canes.  Walkers.  Scooters.  Crutches.  Turn on the lights when you go into a dark area. Replace any light bulbs as soon as they burn out.  Set up your furniture so you have a clear path. Avoid moving your furniture around.  If any of your floors are uneven, fix them.  If there are any pets around you, be aware of where they are.  Review your medicines with your doctor. Some medicines can make you feel dizzy. This can increase your chance of falling. Ask your doctor what other things that you can do to help prevent falls. This information is not intended to replace advice given to you by your health care provider. Make sure you discuss any questions you have with your health care provider. Document Released: 05/22/2009 Document Revised: 01/01/2016 Document Reviewed: 08/30/2014 Elsevier Interactive Patient Education  2017 [employer].

## 2020-05-14 NOTE — Progress Notes (Signed)
This visit occurred during the SARS-CoV-2 public health emergency.  Safety protocols were in place, including screening questions prior to the visit, additional usage of staff PPE, and extensive cleaning of exam room while observing appropriate contact time as indicated for disinfecting solutions.  Subjective:   [name] [name] is a 64 y.o. female who presents for Medicare Annual (Subsequent) preventive examination.  Review of Systems     Cardiac Risk Factors include: diabetes mellitus;hypertension;obesity (BMI >30kg/m2);sedentary lifestyle     Objective:    Today's Vitals   05/14/20 1536  BP: 138/78  Pulse: 73  Temp: 98 F (36.7 C)  TempSrc: Oral  Weight: 204 lb (92.5 kg)  Height: 5' 0.6" (1.539 m)   Body mass index is 39.06 kg/m.  Advanced Directives 05/14/2020 04/19/2019 11/16/2017 12/09/2015 10/28/2015 10/20/2014 12/08/2011  Does Patient Have a Medical Advance Directive? No No No No No No Patient does not have advance directive  Would patient like information on creating a medical advance directive? - Yes (MAU/Ambulatory/Procedural Areas - Information given) Yes (MAU/Ambulatory/Procedural Areas - Information given) - - No - patient declined information Advance directive packet given;Referral made to social work    Current Medications (verified) Outpatient Encounter Medications as of 05/14/2020  Medication Sig  . Alcohol Swabs (ALCOHOL PREP) PADS Use as directed 5 times per day with insulin pen  . amLODipine (NORVASC) 5 MG tablet Take 5 mg by mouth daily.  Marland Kitchen aspirin EC 81 MG tablet Take 81 mg by mouth daily.  Marland Kitchen BAQSIMI ONE PACK 3 MG/DOSE POWD Place into both nostrils daily as needed.  . clotrimazole (LOTRIMIN) 1 % cream Apply to skin on both great toes once daily  . diazepam (VALIUM) 2 MG tablet Take 1 tablet (2 mg total) by mouth every 12 (twelve) hours as needed for muscle spasms.  Marland Kitchen glucose blood (ONETOUCH VERIO) test strip USE TO TEST BLOOD SUGAR 4 TIMES DAILY AS DIRECTED  .  Insulin Aspart (NOVOLOG FLEXPEN [place]) Inject 4 Units into the skin 3 (three) times daily. Sliding scale  . Insulin Detemir (LEVEMIR FLEXTOUCH) 100 UNIT/ML Pen 30 units in the morning and 35 units in the evening  . levothyroxine (SYNTHROID) 75 MCG tablet TAKE 1 TABLET BY MOUTH EVERY MORNING ON AN EMPTY STOMACH 90  . lisinopril (PRINIVIL,ZESTRIL) 10 MG tablet Take by mouth.  . loperamide (IMODIUM) 2 MG capsule Take by mouth as needed for diarrhea or loose stools. One by mouth four times daily.  . metoprolol (LOPRESSOR) 50 MG tablet Take 50 mg by mouth 3 (three) times daily. 3 tabs 2 times per day  . mometasone (NASONEX) 50 MCG/ACT nasal spray Place 2 sprays into the nose daily. (Patient taking differently: Place 2 sprays into the nose daily as needed. )  . mycophenolate (MYFORTIC) 180 MG EC tablet Take 180 mg by mouth 2 (two) times daily.  . NONFORMULARY OR COMPOUNDED ITEM Antifungal solution: Terbinafine 3%, Fluconazole 2%, Tea Tree Oil 5%, Urea 10%, Ibuprofen 2% in DMSO suspension #48mL  . omeprazole (PRILOSEC) 20 MG capsule Take 20 mg by mouth daily.  . predniSONE (DELTASONE) 5 MG tablet Take 5 mg by mouth daily with breakfast.  . simvastatin (ZOCOR) 10 MG tablet TAKE 1 TABLET BY MOUTH EVERY DAY IN THE EVENING  . tacrolimus (PROGRAF) 1 MG capsule 7 capsules 2 times per day  . Tdap (BOOSTRIX) 5-2.5-18.5 LF-MCG/0.5 injection Inject 0.5 mLs into the muscle once for 1 dose.   No facility-administered encounter medications on file as of 05/14/2020.  Allergies (verified) Codeine and Acetaminophen-codeine   History: Past Medical History:  Diagnosis Date  . Blood transfusion   . Chronic kidney disease    s/p transplant, kidney wasn't removed  . Gout due to renal impairment 2010   "before finding out I had kidney failure"  . History of renal transplant   . Hyperlipidemia   . Hypertension   . Sickle cell trait ([place])   . Thyroid disease   . Type II diabetes mellitus ([place])   . UTI (lower  urinary tract infection) 12/08/11   "first one ever"   Past Surgical History:  Procedure Laterality Date  . BREAST BIOPSY Left 08/07/2010   benign stereo  . kidney transplant  02/2009   August 2016 was second transplant  . PARATHYROIDECTOMY    . TUBAL LIGATION  1980's  . VAGINAL HYSTERECTOMY  2006   Family History  Problem Relation Age of Onset  . Heart disease Father   . Hypertension Mother   . Breast cancer Cousin   . Hypertension Sister   . Hypertension Daughter   . Colon cancer Neg Hx   . Esophageal cancer Neg Hx   . Rectal cancer Neg Hx   . Stomach cancer Neg Hx    Social History   Socioeconomic History  . Marital status: Married    Spouse name: Not on file  . Number of children: Not on file  . Years of education: Not on file  . Highest education level: Not on file  Occupational History  . Occupation: retired  Tobacco Use  . Smoking status: Former Smoker    Packs/day: 0.50    Years: 10.00    Pack years: 5.00    Types: Cigarettes    Quit date: 11/07/1988    Years since quitting: 31.5  . Smokeless tobacco: Never Used  Vaping Use  . Vaping Use: Never used  Substance and Sexual Activity  . Alcohol use: No  . Drug use: No  . Sexual activity: Yes  Other Topics Concern  . Not on file  Social History Narrative  . Not on file   Social Determinants of Health   Financial Resource Strain: Low Risk   . Difficulty of Paying Living Expenses: Not hard at all  Food Insecurity: No Food Insecurity  . Worried About Charity fundraiser in the Last Year: Never true  . Ran Out of Food in the Last Year: Never true  Transportation Needs: No Transportation Needs  . Lack of Transportation (Medical): No  . Lack of Transportation (Non-Medical): No  Physical Activity: Insufficiently Active  . Days of Exercise per Week: 1 day  . Minutes of Exercise per Session: 20 min  Stress: No Stress Concern Present  . Feeling of Stress : Not at all  Social Connections:   . Frequency of  Communication with Friends and Family: Not on file  . Frequency of Social Gatherings with Friends and Family: Not on file  . Attends Religious Services: Not on file  . Active Member of Clubs or Organizations: Not on file  . Attends Archivist Meetings: Not on file  . Marital Status: Not on file    Tobacco Counseling Counseling given: Not Answered   Clinical Intake:  Pre-visit preparation completed: Yes  Pain : No/denies pain     Nutritional Status: BMI > 30  Obese Nutritional Risks: Nausea/ vomitting/ diarrhea (diarrhea that is normal) Diabetes: Yes  How often do you need to have someone help you when you read  instructions, pamphlets, or other written materials from your doctor or pharmacy?: 1 - Never What is the last grade level you completed in school?: master's degree  Diabetic? Yes Nutrition Risk Assessment:  Has the patient had any N/V/D within the last 2 months?  No  Does the patient have any non-healing wounds?  No  Has the patient had any unintentional weight loss or weight gain?  No   Diabetes:  Is the patient diabetic?  Yes  If diabetic, was a CBG obtained today?  No  Did the patient bring in their glucometer from home?  No  How often do you monitor your CBG's? 3 times daily.   Financial Strains and Diabetes Management:  Are you having any financial strains with the device, your supplies or your medication? No .  Does the patient want to be seen by Chronic Care Management for management of their diabetes?  No  Would the patient like to be referred to a Nutritionist or for Diabetic Management?  No   Diabetic Exams:  Diabetic Eye Exam: Completed 05/08/2020 Diabetic Foot Exam: Completed today   Interpreter Needed?: No  Information entered by :: NAllen LPN   Activities of Daily Living In your present state of health, do you have any difficulty performing the following activities: 05/14/2020  Hearing? N  Vision? N  Difficulty concentrating or  making decisions? N  Walking or climbing stairs? Y  Dressing or bathing? N  Doing errands, shopping? N  Preparing Food and eating ? N  Using the Toilet? N  In the past six months, have you accidently leaked urine? Y  Comment wears a pad  Do you have problems with loss of bowel control? Y  Comment sometimes with bad cases of diarrhea  Managing your Medications? N  Managing your Finances? N  Housekeeping or managing your Housekeeping? N  Some recent data might be hidden    Patient Care Team: Glendale Chard, MD as PCP - General (Internal Medicine) Jacelyn Pi, MD as Referring Physician (Endocrinology) Cyril Mourning, [facility] (Pharmacist)  Indicate any recent Medical Services you may have received from other than [facility] providers in the past year (date may be approximate).     Assessment:   This is a routine wellness examination for [place].  Hearing/Vision screen  Hearing Screening   125Hz  250Hz  500Hz  1000Hz  2000Hz  3000Hz  4000Hz  6000Hz  8000Hz   Right ear:           Left ear:           Vision Screening Comments: Regular eye exams, Dr. Zadie Rhine  Dietary issues and exercise activities discussed: Current Exercise Habits: Home exercise routine, Type of exercise: stretching, Time (Minutes): 20, Frequency (Times/Week): 1, Weekly Exercise (Minutes/Week): 20  Goals    . Patient Stated     05/14/2020, exercise more and stay healthy    . Weight (lb) < 200 lb (90.7 kg)     Patient wants to lose around 20 lbs      Depression Screen PHQ 2/9 Scores 05/14/2020 04/19/2019 08/21/2018 11/16/2017 12/09/2015 10/28/2015  PHQ - 2 Score 1 0 0 0 0 1  PHQ- 9 Score - 1 - - - -    Fall Risk Fall Risk  05/14/2020 10/17/2019 04/19/2019 08/21/2018 11/16/2017  Falls in the past year? 0 0 0 0 No  Risk for fall due to : Medication side effect - Medication side effect - -  Follow up Falls evaluation completed;Education provided;Falls prevention discussed - Falls evaluation completed;Falls prevention discussed -  -  Any stairs in or around the home? Yes  If so, are there any without handrails? No  Home free of loose throw rugs in walkways, pet beds, electrical cords, etc? Yes  Adequate lighting in your home to reduce risk of falls? Yes   ASSISTIVE DEVICES UTILIZED TO PREVENT FALLS:  Life alert? No  Use of a cane, walker or w/c? No  Grab bars in the bathroom? No  Shower chair or bench in shower? No  Elevated toilet seat or a handicapped toilet? No   TIMED UP AND GO:  Was the test performed? No .   Gait slow and steady without use of assistive device  Cognitive Function:     6CIT Screen 05/14/2020 04/19/2019  What Year? 0 points 0 points  What month? 0 points 0 points  What time? 0 points 0 points  Count back from 20 0 points 0 points  Months in reverse 0 points 0 points  Repeat phrase 0 points 0 points  Total Score 0 0    Immunizations Immunization History  Administered Date(s) Administered  . Influenza,inj,Quad PF,6+ Mos 04/07/2018  . Influenza-Unspecified 05/24/2015, 05/09/2018, 04/21/2020  . PFIZER SARS-COV-2 Vaccination 10/25/2019, 11/15/2019    TDAP status: Due, Education has been provided regarding the importance of this vaccine. Advised may receive this vaccine at local pharmacy or Health Dept. Aware to provide a copy of the vaccination record if obtained from local pharmacy or Health Dept. Verbalized acceptance and understanding. Flu Vaccine status: Up to date Pneumococcal vaccine status: Up to date Covid-19 vaccine status: Completed vaccines  Qualifies for Shingles Vaccine? Yes   Zostavax completed No   Shingrix Completed?: No.    Education has been provided regarding the importance of this vaccine. Patient has been advised to call insurance company to determine out of pocket expense if they have not yet received this vaccine. Advised may also receive vaccine at local pharmacy or Health Dept. Verbalized acceptance and understanding.  Screening Tests Health  Maintenance  Topic Date Due  . TETANUS/TDAP  Never done  . HEMOGLOBIN A1C  10/05/2019  . HIV Screening  10/16/2020 (Originally 06/23/1971)  . OPHTHALMOLOGY EXAM  05/08/2021  . FOOT EXAM  05/14/2021  . MAMMOGRAM  05/30/2021  . COLONOSCOPY  01/31/2022  . INFLUENZA VACCINE  Completed  . COVID-19 Vaccine  Completed  . Hepatitis C Screening  Completed    Health Maintenance  Health Maintenance Due  Topic Date Due  . TETANUS/TDAP  Never done  . HEMOGLOBIN A1C  10/05/2019    Colorectal cancer screening: Completed 02/01/2012. Repeat every 10 years Mammogram status: scheduled for the 25th Bone Density status: n/a  Lung Cancer Screening: (Low Dose CT Chest recommended if Age 53-80 years, 30 pack-year currently smoking OR have quit w/in 15years.) does not qualify.   Lung Cancer Screening Referral: no  Additional Screening:  Hepatitis C Screening: does qualify; Completed 08/21/2018  Vision Screening: Recommended annual ophthalmology exams for early detection of glaucoma and other disorders of the eye. Is the patient up to date with their annual eye exam?  Yes  Who is the provider or what is the name of the office in which the patient attends annual eye exams? Dr. Zadie Rhine If pt is not established with a provider, would they like to be referred to a provider to establish care? No .   Dental Screening: Recommended annual dental exams for proper oral hygiene  Community Resource Referral / Chronic Care Management: CRR required this visit?  No   CCM  required this visit?  No      Plan:     I have personally reviewed and noted the following in the patient's chart:   . Medical and social history . Use of alcohol, tobacco or illicit drugs  . Current medications and supplements . Functional ability and status . Nutritional status . Physical activity . Advanced directives . List of other physicians . Hospitalizations, surgeries, and ER visits in previous 12  months . Vitals . Screenings to include cognitive, depression, and falls . Referrals and appointments  In addition, I have reviewed and discussed with patient certain preventive protocols, quality metrics, and best practice recommendations. A written personalized care plan for preventive services as well as general preventive health recommendations were provided to patient.     Kellie Simmering, LPN   [id number]/01/[id number]   Nurse Notes:

## 2020-05-14 NOTE — Patient Instructions (Signed)
Health Maintenance, Female Adopting a healthy lifestyle and getting preventive care are important in promoting health and wellness. Ask your health care provider about:  The right schedule for you to have regular tests and exams.  Things you can do on your own to prevent diseases and keep yourself healthy. What should I know about diet, weight, and exercise? Eat a healthy diet   Eat a diet that includes plenty of vegetables, fruits, low-fat dairy products, and lean protein.  Do not eat a lot of foods that are high in solid fats, added sugars, or sodium. Maintain a healthy weight Body mass index (BMI) is used to identify weight problems. It estimates body fat based on height and weight. Your health care provider can help determine your BMI and help you achieve or maintain a healthy weight. Get regular exercise Get regular exercise. This is one of the most important things you can do for your health. Most adults should:  Exercise for at least 150 minutes each week. The exercise should increase your heart rate and make you sweat (moderate-intensity exercise).  Do strengthening exercises at least twice a week. This is in addition to the moderate-intensity exercise.  Spend less time sitting. Even light physical activity can be beneficial. Watch cholesterol and blood lipids Have your blood tested for lipids and cholesterol at 65 years of age, then have this test every 5 years. Have your cholesterol levels checked more often if:  Your lipid or cholesterol levels are high.  You are older than 65 years of age.  You are at high risk for heart disease. What should I know about cancer screening? Depending on your health history and family history, you may need to have cancer screening at various ages. This may include screening for:  Breast cancer.  Cervical cancer.  Colorectal cancer.  Skin cancer.  Lung cancer. What should I know about heart disease, diabetes, and high blood  pressure? Blood pressure and heart disease  High blood pressure causes heart disease and increases the risk of stroke. This is more likely to develop in people who have high blood pressure readings, are of African descent, or are overweight.  Have your blood pressure checked: ? Every 3-5 years if you are 18-39 years of age. ? Every year if you are 40 years old or older. Diabetes Have regular diabetes screenings. This checks your fasting blood sugar level. Have the screening done:  Once every three years after age 40 if you are at a normal weight and have a low risk for diabetes.  More often and at a younger age if you are overweight or have a high risk for diabetes. What should I know about preventing infection? Hepatitis B If you have a higher risk for hepatitis B, you should be screened for this virus. Talk with your health care provider to find out if you are at risk for hepatitis B infection. Hepatitis C Testing is recommended for:  Everyone born from 1945 through 1965.  Anyone with known risk factors for hepatitis C. Sexually transmitted infections (STIs)  Get screened for STIs, including gonorrhea and chlamydia, if: ? You are sexually active and are younger than 65 years of age. ? You are older than 65 years of age and your health care provider tells you that you are at risk for this type of infection. ? Your sexual activity has changed since you were last screened, and you are at increased risk for chlamydia or gonorrhea. Ask your health care provider if   you are at risk.  Ask your health care provider about whether you are at high risk for HIV. Your health care provider may recommend a prescription medicine to help prevent HIV infection. If you choose to take medicine to prevent HIV, you should first get tested for HIV. You should then be tested every 3 months for as long as you are taking the medicine. Pregnancy  If you are about to stop having your period (premenopausal) and  you may become pregnant, seek counseling before you get pregnant.  Take 400 to 800 micrograms (mcg) of folic acid every day if you become pregnant.  Ask for birth control (contraception) if you want to prevent pregnancy. Osteoporosis and menopause Osteoporosis is a disease in which the bones lose minerals and strength with aging. This can result in bone fractures. If you are 65 years old or older, or if you are at risk for osteoporosis and fractures, ask your health care provider if you should:  Be screened for bone loss.  Take a calcium or vitamin D supplement to lower your risk of fractures.  Be given hormone replacement therapy (HRT) to treat symptoms of menopause. Follow these instructions at home: Lifestyle  Do not use any products that contain nicotine or tobacco, such as cigarettes, e-cigarettes, and chewing tobacco. If you need help quitting, ask your health care provider.  Do not use street drugs.  Do not share needles.  Ask your health care provider for help if you need support or information about quitting drugs. Alcohol use  Do not drink alcohol if: ? Your health care provider tells you not to drink. ? You are pregnant, may be pregnant, or are planning to become pregnant.  If you drink alcohol: ? Limit how much you use to 0-1 drink a day. ? Limit intake if you are breastfeeding.  Be aware of how much alcohol is in your drink. In the [place] one drink equals one 12 oz bottle of beer (355 mL), one 5 oz glass of wine (148 mL), or one 1 oz glass of hard liquor (44 mL). General instructions  Schedule regular health, dental, and eye exams.  Stay current with your vaccines.  Tell your health care provider if: ? You often feel depressed. ? You have ever been abused or do not feel safe at home. Summary  Adopting a healthy lifestyle and getting preventive care are important in promoting health and wellness.  Follow your health care provider's instructions about healthy  diet, exercising, and getting tested or screened for diseases.  Follow your health care provider's instructions on monitoring your cholesterol and blood pressure. This information is not intended to replace advice given to you by your health care provider. Make sure you discuss any questions you have with your health care provider. Document Revised: 07/19/2018 Document Reviewed: 07/19/2018 Elsevier Patient Education  2020 [employer] Inc[employer]  

## 2020-05-14 NOTE — Progress Notes (Signed)
I,[name] [name],acting as a Education administrator for [name], MD.,have documented all relevant documentation on the behalf of [name], MD,as directed by  [name], MD while in the presence of [name], MD.  This visit occurred during the SARS-CoV-2 public health emergency.  Safety protocols were in place, including screening questions prior to the visit, additional usage of staff PPE, and extensive cleaning of exam room while observing appropriate contact time as indicated for disinfecting solutions.  Subjective:     Patient ID: [name] [name] , female    DOB: 12/04/1955 , 64 y.o.   MRN: [id number]   Chief Complaint  Patient presents with  . Annual Exam  . Diabetes  . Hypertension    HPI  She is here today for full physical exam. She goes to [facility] for her PAP.  She sees Dr Chalmers Cater as her endocrinologist. Dr Marval Regal is her kidney specialist.  She denies headaches, chest pain and shortness of breath. Unfortunately, she has been dealing with the recent death of her husband.   Hypertension This is a chronic problem. The current episode started more than 1 year ago. The problem has been gradually improving since onset. The problem is controlled. Pertinent negatives include no blurred vision, chest pain, palpitations or shortness of breath. Risk factors for coronary artery disease include obesity, post-menopausal state and sedentary lifestyle. The current treatment provides moderate improvement. Compliance problems include exercise.  Hypertensive end-organ damage includes kidney disease.  Diabetes There are no hypoglycemic associated symptoms. Pertinent negatives for diabetes include no blurred vision and no chest pain. There are no hypoglycemic complications. Risk factors for coronary artery disease include diabetes mellitus, dyslipidemia, hypertension and post-menopausal.     Past Medical History:  Diagnosis Date  . Blood transfusion   . Chronic kidney disease     s/p transplant, kidney wasn't removed  . Gout due to renal impairment 2010   "before finding out I had kidney failure"  . History of renal transplant   . Hyperlipidemia   . Hypertension   . Sickle cell trait ([place])   . Thyroid disease   . Type II diabetes mellitus ([place])   . UTI (lower urinary tract infection) 12/08/11   "first one ever"     Family History  Problem Relation Age of Onset  . Heart disease Father   . Hypertension Mother   . Breast cancer Cousin   . Hypertension Sister   . Hypertension Daughter   . Colon cancer Neg Hx   . Esophageal cancer Neg Hx   . Rectal cancer Neg Hx   . Stomach cancer Neg Hx      Current Outpatient Medications:  .  Alcohol Swabs (ALCOHOL PREP) PADS, Use as directed 5 times per day with insulin pen, Disp: 600 each, Rfl: 11 .  amLODipine (NORVASC) 5 MG tablet, Take 5 mg by mouth daily. (Patient not taking: Reported on 05/15/2020), Disp: , Rfl:  .  aspirin EC 81 MG tablet, Take 81 mg by mouth daily., Disp: , Rfl:  .  clotrimazole (LOTRIMIN) 1 % cream, Apply to skin on both great toes once daily, Disp: 45 g, Rfl: 1 .  diazepam (VALIUM) 2 MG tablet, Take 1 tablet (2 mg total) by mouth every 12 (twelve) hours as needed for muscle spasms., Disp: 20 tablet, Rfl: 0 .  glucose blood (ONETOUCH VERIO) test strip, USE TO TEST BLOOD SUGAR 4 TIMES DAILY AS DIRECTED, Disp: , Rfl:  .  Insulin Aspart (NOVOLOG FLEXPEN  [place]), Inject 4 Units into the skin 3 (three) times daily. Sliding scale, Disp: , Rfl:  .  Insulin Detemir (LEVEMIR FLEXTOUCH) 100 UNIT/ML Pen, 30 units in the morning and 35 units in the evening, Disp: , Rfl:  .  levothyroxine (SYNTHROID) 75 MCG tablet, TAKE 1 TABLET BY MOUTH EVERY MORNING ON AN EMPTY STOMACH 90, Disp: , Rfl:  .  lisinopril (PRINIVIL,ZESTRIL) 10 MG tablet, Take by mouth., Disp: , Rfl:  .  loperamide (IMODIUM) 2 MG capsule, Take by mouth as needed for diarrhea or loose stools. One by mouth four times daily., Disp: , Rfl:  .  metoprolol  tartrate (LOPRESSOR) 25 MG tablet, Take 75 mg by mouth 2 (two) times daily. 3 tabs 2 times per day, Disp: , Rfl:  .  mometasone (NASONEX) 50 MCG/ACT nasal spray, Place 2 sprays into the nose daily. (Patient not taking: Reported on 05/15/2020), Disp: 17 g, Rfl: 12 .  mycophenolate (MYFORTIC) 180 MG EC tablet, Take 360 mg by mouth 2 (two) times daily. , Disp: , Rfl:  .  NONFORMULARY OR COMPOUNDED ITEM, Antifungal solution: Terbinafine 3%, Fluconazole 2%, Tea Tree Oil 5%, Urea 10%, Ibuprofen 2% in DMSO suspension #75m (Patient not taking: Reported on 05/15/2020), Disp: 1 each, Rfl: 3 .  omeprazole (PRILOSEC) 20 MG capsule, Take 20 mg by mouth daily., Disp: , Rfl:  .  predniSONE (DELTASONE) 5 MG tablet, Take 5 mg by mouth daily with breakfast., Disp: , Rfl:  .  simvastatin (ZOCOR) 10 MG tablet, TAKE 1 TABLET BY MOUTH EVERY DAY IN THE EVENING, Disp: 90 tablet, Rfl: 1 .  tacrolimus (PROGRAF) 1 MG capsule, 7 capsules in the morning, 6 capsules in the evening, Disp: , Rfl:  .  BAQSIMI ONE PACK 3 MG/DOSE POWD, Place into both nostrils daily as needed., Disp: , Rfl:    Allergies  Allergen Reactions  . Codeine Hives  . Acetaminophen-Codeine Rash      The patient states she uses post menopausal status for birth control. Last LMP was No LMP recorded. Patient has had a hysterectomy.. Negative for Dysmenorrhea. Negative for: breast discharge, breast lump(s), breast pain and breast self exam. Associated symptoms include abnormal vaginal bleeding. Pertinent negatives include abnormal bleeding (hematology), anxiety, decreased libido, depression, difficulty falling sleep, dyspareunia, history of infertility, nocturia, sexual dysfunction, sleep disturbances, urinary incontinence, urinary urgency, vaginal discharge and vaginal itching. Diet regular.The patient states her exercise level is   intermittent.   . The patient's tobacco use is:  Social History   Tobacco Use  Smoking Status Former Smoker  . Packs/day: 0.50   . Years: 10.00  . Pack years: 5.00  . Types: Cigarettes  . Quit date: 11/07/1988  . Years since quitting: 31.5  Smokeless Tobacco Never Used  . She has been exposed to passive smoke. The patient's alcohol use is:  Social History   Substance and Sexual Activity  Alcohol Use No    Review of Systems  Constitutional: Negative.   HENT: Negative.   Eyes: Negative.  Negative for blurred vision.  Respiratory: Negative.  Negative for shortness of breath.   Cardiovascular: Negative.  Negative for chest pain and palpitations.  Gastrointestinal: Negative.   Endocrine: Negative.   Genitourinary: Negative.   Musculoskeletal: Negative.   Skin: Negative.   Allergic/Immunologic: Negative.   Neurological: Negative.   Hematological: Negative.   Psychiatric/Behavioral: Negative.      Today's Vitals   05/14/20 1520  BP: 138/78  Pulse: 73  Temp: 98 F (36.7 C)  TempSrc: Oral  Weight: 204 lb 12.8 oz (92.9 kg)  Height: 5' 0.6" (1.539 m)   Body mass index is 39.21 kg/m.   Objective:  Physical Exam Vitals and nursing note reviewed.  Constitutional:      Appearance: Normal appearance. She is obese.  HENT:     Head: Normocephalic and atraumatic.     Right Ear: Tympanic membrane, ear canal and external ear normal.     Left Ear: Tympanic membrane, ear canal and external ear normal.     Nose:     Comments: Deferred, masked    Mouth/Throat:     Comments: Deferred, masked Eyes:     Extraocular Movements: Extraocular movements intact.     Conjunctiva/sclera: Conjunctivae normal.     Pupils: Pupils are equal, round, and reactive to light.  Cardiovascular:     Rate and Rhythm: Normal rate and regular rhythm.     Pulses: Normal pulses.          Dorsalis pedis pulses are 2+ on the right side and 2+ on the left side.     Heart sounds: Normal heart sounds.  Pulmonary:     Effort: Pulmonary effort is normal.     Breath sounds: Normal breath sounds.  Chest:     Breasts: Tanner Score is 5.         Right: Normal.        Left: Normal.  Abdominal:     General: Bowel sounds are normal.     Palpations: Abdomen is soft.  Genitourinary:    Comments: deferred Musculoskeletal:        General: Normal range of motion.     Cervical back: Normal range of motion and neck supple.  Feet:     Right foot:     Protective Sensation: 5 sites tested. 5 sites sensed.     Skin integrity: Dry skin present.     Toenail Condition: Right toenails are abnormally thick and long.     Left foot:     Protective Sensation: 5 sites tested. 5 sites sensed.     Skin integrity: Dry skin present.     Toenail Condition: Left toenails are long.  Skin:    General: Skin is warm and dry.  Neurological:     General: No focal deficit present.     Mental Status: She is alert and oriented to person, place, and time.  Psychiatric:        Mood and Affect: Mood normal.        Behavior: Behavior normal.         Assessment And Plan:     1. Encounter for annual physical exam Comments: A full exam was performed. Importance of monthly self breast exams was discussed with the patient. PATIENT IS ADVISED TO GET 30-45 MINUTES REGULAR EXERCISE NO LESS THAN FOUR TO FIVE DAYS PER WEEK - BOTH WEIGHTBEARING EXERCISES AND AEROBIC ARE RECOMMENDED.  PATIENT IS ADVISED TO FOLLOW A HEALTHY DIET WITH AT LEAST SIX FRUITS/VEGGIES PER DAY, DECREASE INTAKE OF RED MEAT, AND TO INCREASE FISH INTAKE TO TWO DAYS PER WEEK.  MEATS/FISH SHOULD NOT BE FRIED, BAKED OR BROILED IS PREFERABLE.  I SUGGEST WEARING SPF 50 SUNSCREEN ON EXPOSED PARTS AND ESPECIALLY WHEN IN THE DIRECT SUNLIGHT FOR AN EXTENDED PERIOD OF TIME.  PLEASE AVOID FAST FOOD RESTAURANTS AND INCREASE YOUR WATER INTAKE.  2. Hypertensive nephropathy Comments: Chronic, fair control. She will continue with current meds. She is encouraged to avoid adding salt to her foods.  EKG performed, NSR w/ nonspecific T wave abnormality. She will rto in six months for re-evaluation.  - Lipid  panel  3. Type 2 diabetes mellitus with stage 3 chronic kidney disease, with long-term current use of insulin, unspecified whether stage 3a or 3b CKD ([place]) Comments: Diabetic foot exam was performed.  I DISCUSSED WITH THE PATIENT AT LENGTH REGARDING THE GOALS OF GLYCEMIC CONTROL AND POSSIBLE LONG-TERM COMPLICATIONS.  I  ALSO STRESSED THE IMPORTANCE OF COMPLIANCE WITH HOME GLUCOSE MONITORING, DIETARY RESTRICTIONS INCLUDING AVOIDANCE OF SUGARY DRINKS/PROCESSED FOODS,  ALONG WITH REGULAR EXERCISE.  I  ALSO STRESSED THE IMPORTANCE OF ANNUAL EYE EXAMS, SELF FOOT CARE AND COMPLIANCE WITH OFFICE VISITS.  - CBC - Hemoglobin A1c - EKG 12-Lead - POCT UA - Microalbumin - POCT urinalysis dipstick - BMP8+EGFR  4. Vitamin D deficiency Comments: I will check vitamin D level and supplement as needed.  - VITAMIN D 25 Hydroxy (Vit-D Deficiency, Fractures)  5. Abnormal urine Comments: Leukocytes and trace blood are present. I will send off urine culture and treat accordingly.  - Culture, Urine  6. Class 2 severe obesity due to excess calories with serious comorbidity and body mass index (BMI) of 39.0 to 39.9 in adult [facility]) Comments: She is encouraged to initially strive for BMi less than 34 to decrease cardiac risk. Advised to aim for at least 150 minutes of exercise per week.   Wt Readings from Last 3 Encounters:  05/14/20 204 lb (92.5 kg)  05/14/20 204 lb 12.8 oz (92.9 kg)  04/27/20 207 lb (93.9 kg)       Patient was given opportunity to ask questions. Patient verbalized understanding of the plan and was able to repeat key elements of the plan. All questions were answered to their satisfaction.   [name], MD   I, [name], MD, have reviewed all documentation for this visit. The documentation on 06/01/20 for the exam, diagnosis, procedures, and orders are all accurate and complete.  THE PATIENT IS ENCOURAGED TO PRACTICE SOCIAL DISTANCING DUE TO THE COVID-19 PANDEMIC.

## 2020-05-15 ENCOUNTER — Ambulatory Visit: Payer: Medicare HMO

## 2020-05-15 DIAGNOSIS — I129 Hypertensive chronic kidney disease with stage 1 through stage 4 chronic kidney disease, or unspecified chronic kidney disease: Secondary | ICD-10-CM

## 2020-05-15 DIAGNOSIS — Z794 Long term (current) use of insulin: Secondary | ICD-10-CM

## 2020-05-15 DIAGNOSIS — N183 Chronic kidney disease, stage 3 unspecified: Secondary | ICD-10-CM

## 2020-05-15 DIAGNOSIS — E7800 Pure hypercholesterolemia, unspecified: Secondary | ICD-10-CM

## 2020-05-15 NOTE — Chronic Care Management (AMB) (Signed)
Chronic Care Management Pharmacy  Name: [name] [name]  MRN: [id number] DOB: 02-20-1956  Chief Complaint/ HPI  [name] [name],  64 y.o. , female presents for their Initial CCM visit with the clinical pharmacist via telephone due to COVID-19 Pandemic.  PCP : Glendale Chard, MD  Their chronic conditions include: Hypertension, Hyperlipidemia, Diabetes and Chronic Kidney Disease  Office Visits: 05/14/20 AWV and OV: Annual physical exam. HTN follow up.   Consult Visits: 05/08/20 Ophthalmology OV w/ Dr. Zadie Rhine: No diabetic retinopathy. Discussed posterior vitreous detachment of the left eye. Advised pt to contact office if new or different flashes or floaters develop.   04/27/20 ED: Presents with fever, chills,nausea, vomiting and diarrhea. Began to feel ill about a week ago. Given Augmentin for UTI.   04/04/20 Transplant OV w/ Dr. Wess Botts: Presented for annual visit s/p DDRT on 03/29/15 (second transplant). Continues labs every 2 months and visit with nephrologist every 3-6 months. Continue FK, Myfortic 343m BID, prednisone 55mdaily. Continue Bactrim indefinitely. Consider CGM and GLP1, follow up with endocrinology. Continue PPI for GERD. Okay to take BID if needed. Encourage to supplement with Tums as needed. Continue Sensipar 3026mUse Imodium as needed for loose stools.   03/21/20 Podiatry OV w/ Dr. GalElisha Ponderontinue diabetic foot care principles. Toenails left and right hallux debrided. Continue soft, supportive shoe gear. Regarding right ankle spasms, discussed adequate hydration. Return in 30 months for diabetic nail trim.   12/19/19 Podiatry OV w/ Dr. GalElisha Ponderontinue diabetic foot care principles. Toenails left and right hallux debrided. Continue soft, supportive shoe gear. Return in 30 months for diabetic nail trim.   CCM Encounters:  Medications: Outpatient Encounter Medications as of 05/15/2020  Medication Sig Note  . Alcohol Swabs (ALCOHOL PREP) PADS Use as directed 5  times per day with insulin pen   . aspirin EC 81 MG tablet Take 81 mg by mouth daily.   . BMarland KitchenQSIMI ONE PACK 3 MG/DOSE POWD Place into both nostrils daily as needed.   . clotrimazole (LOTRIMIN) 1 % cream Apply to skin on both great toes once daily   . diazepam (VALIUM) 2 MG tablet Take 1 tablet (2 mg total) by mouth every 12 (twelve) hours as needed for muscle spasms.   . gMarland Kitchenucose blood (ONETOUCH VERIO) test strip USE TO TEST BLOOD SUGAR 4 TIMES DAILY AS DIRECTED   . Insulin Aspart (NOVOLOG FLEXPEN [place]) Inject 4 Units into the skin 3 (three) times daily. Sliding scale 11/16/2017: Pt reports 4 units, 3 times daily   . Insulin Detemir (LEVEMIR FLEXTOUCH) 100 UNIT/ML Pen 30 units in the morning and 35 units in the evening   . levothyroxine (SYNTHROID) 75 MCG tablet TAKE 1 TABLET BY MOUTH EVERY MORNING ON AN EMPTY STOMACH 90   . lisinopril (PRINIVIL,ZESTRIL) 10 MG tablet Take by mouth.   . loperamide (IMODIUM) 2 MG capsule Take by mouth as needed for diarrhea or loose stools. One by mouth four times daily.   . metoprolol tartrate (LOPRESSOR) 25 MG tablet Take 75 mg by mouth 2 (two) times daily. 3 tabs 2 times per day   . mycophenolate (MYFORTIC) 180 MG EC tablet Take 360 mg by mouth 2 (two) times daily.    . oMarland Kitcheneprazole (PRILOSEC) 20 MG capsule Take 20 mg by mouth daily.   . predniSONE (DELTASONE) 5 MG tablet Take 5 mg by mouth daily with breakfast.   . simvastatin (ZOCOR) 10 MG tablet TAKE 1 TABLET BY MOUTH EVERY DAY IN THE EVENING   .  tacrolimus (PROGRAF) 1 MG capsule 7 capsules in the morning, 6 capsules in the evening 11/16/2017: Pt reports taking 7 in the morning and 6 at night.   Marland Kitchen amLODipine (NORVASC) 5 MG tablet Take 5 mg by mouth daily. (Patient not taking: Reported on 05/15/2020)   . mometasone (NASONEX) 50 MCG/ACT nasal spray Place 2 sprays into the nose daily. (Patient not taking: Reported on 05/15/2020)   . NONFORMULARY OR COMPOUNDED ITEM Antifungal solution: Terbinafine 3%, Fluconazole 2%, Tea  Tree Oil 5%, Urea 10%, Ibuprofen 2% in DMSO suspension #68m (Patient not taking: Reported on 05/15/2020)    No facility-administered encounter medications on file as of 05/15/2020.    Current Diagnosis/Assessment:  SDOH Interventions     Most Recent Value  SDOH Interventions  Financial Strain Interventions Intervention Not Indicated      Goals Addressed            This Visit's Progress   . Pharmacy Care Plan       CARE PLAN ENTRY (see longitudinal plan of care for additional care plan information)  Current Barriers:  . Chronic Disease Management support, education, and care coordination needs related to Hypertension, Hyperlipidemia, and Diabetes   Hypertension BP Readings from Last 3 Encounters:  05/14/20 138/78  05/14/20 138/78  04/27/20 (!) 126/57   . Pharmacist Clinical Goal(s): o Over the next 180 days, patient will work with PharmD and providers to maintain BP goal <130/80 . Current regimen:  o Metoprolol tartrate 239mthree tablets twice daily o Lisinopril 1034maily . Interventions: o Provided dietary and exercise recommendations o Recommend patient check blood pressure weekly and if symptomatic . Patient self care activities - Over the next 180 days, patient will: o Check BP weekly and if symptomatic, document, and provide at future appointments o Ensure daily salt intake < 2300 mg/day o Increase exercise as tolerating with a goal of 30 minutes daily 5 times per week (or 150 minutes total)  Hyperlipidemia Lab Results  Component Value Date/Time   LDLCALC 85 04/19/2019 03:49 PM   . Pharmacist Clinical Goal(s): o Over the next 90 days, patient will work with PharmD and providers to achieve LDL goal < 70 . Current regimen:  o Simvastatin 43m11mily . Interventions: o Provided dietary and exercise recommendations o Discussed appropriate goals for LDL (less than 70), HDL (greater than 50), and triglycerides (less than 150) . Patient self care activities -  Over the next 90 days, patient will: o Increase exercise as tolerating with a goal of 30 minutes daily 5 times per week (or 150 minutes total) o Limit fried and fatty foods  Diabetes Lab Results  Component Value Date/Time   HGBA1C 8.3 03/17/2017 12:00 AM   HGBA1C 6.6 (H) 12/08/2011 05:49 PM   . Pharmacist Clinical Goal(s): o Over the next 90 days, patient will work with PharmD and providers to achieve A1c goal <7% . Current regimen:  o Levemir 100 units/ml 30 units in the morning and 35 units in the evening o Novolog Flexpen 100 units/ml 4 units three times daily (sliding scale) . Interventions: o Provided dietary and exercise recommendations o Recommend patient consistently check blood sugar before meal and use Novolog as directed - Recommend patient set alarms on her phone to check blood sugar or keep blood glucose meter near by . Patient self care activities - Over the next 90 days, patient will: o Check blood sugar 3-4 times daily, document, and provide at future appointments o Contact provider with any episodes  of hypoglycemia o Increase exercise as tolerating with a goal of 30 minutes daily 5 times per week (or 150 minutes total)  Medication management . Pharmacist Clinical Goal(s): o Over the next 90 days, patient will work with PharmD and providers to achieve optimal medication adherence . Current pharmacy: [facility] . Interventions o Comprehensive medication review performed. o Continue current medication management strategy . Patient self care activities - Over the next 90 days, patient will: o Focus on medication adherence by continued use of pill box o Take medications as prescribed o Report any questions or concerns to PharmD and/or provider(s)  Initial goal documentation        Diabetes   A1c goal <7%  Recent Relevant Labs: Lab Results  Component Value Date/Time   HGBA1C 8.3 03/17/2017 12:00 AM   HGBA1C 6.6 (H) 12/08/2011 05:49 PM   MICROALBUR 150 05/14/2020  05:29 PM    Last diabetic Eye exam:  Lab Results  Component Value Date/Time   HMDIABEYEEXA No Retinopathy 01/30/2019 12:00 AM    Last diabetic Foot exam: 05/14/20  Checking BG: 3x per Day  Recent FBG Readings: 94, 111, 169 Recent pre-meal BG readings: 220, 64 (used Baqsimi), 257 Recent 2hr PP BG readings:   Recent HS BG readings:   Patient has failed these meds in past: Lantus, Actos Patient is currently uncontrolled on the following medications: . Levemir 100 units/ml 30 units in the morning and 35 units in the evening . Novolog Flexpen 100 units/ml 4 units three times daily (sliding scale) . Baqsimi use in both nostrils as needed  We discussed:  . Pt reports she had to use Baqsimi yesterday due to low BG . Pt is not checking BG consistently three times daily . Pt states that if she doesn't check her BG then she usually doesn't administer her Novolog o Recommend pt check BG consistently and administer Novolog with each meal o Advised pt to set alarms on her phone to check BG or make sure her BG meter is near her around meal times . Diet extensively o Pt reports that she does not eat a big breakfast usually, maybe some fruit o Pt eats a sandwich or crackers o Pt's biggest meal is dinner: a meat and vegetables - Yesterday: Chicken and broccoli casserole and salad o PLATE method . Exercise extensively o Pt has not been exercising recently - Was walking a couple of miles in the morning before the pandemic - Pt has not had much motivation since her husband passed away o Recommend pt get 30 minutes of moderate intensity exercise daily 5 times a week (150 minutes total per week) - Start slow at 10-15 minutes 2-3 days per week  Plan Continue current medications   Hyperlipidemia   LDL goal < 70  Lipid Panel     Component Value Date/Time   CHOL 180 04/19/2019 1549   TRIG 263 (H) 04/19/2019 1549   HDL 52 04/19/2019 1549   LDLCALC 85 04/19/2019 1549    Hepatic Function  Latest Ref Rng & Units 04/27/2020 08/21/2018 03/17/2017  Total Protein 6.5 - 8.1 g/dL 7.9 7.3 -  Albumin 3.5 - 5.0 g/dL 3.6 4.2 -  AST 15 - 41 U/L 11(L) 10 12(A)  ALT 0 - 44 U/L _0 Alk Phosphatase 38 - 126 U/L 73 85 90  Total Bilirubin 0.3 - 1.2 mg/dL 1.3(H) 0.5 -     The 10-year ASCVD risk score Mikey Bussing DC Jr., et al., 2013) is: 19.3%  Values used to calculate the score:     Age: 68 years     Sex: Female     Is Non-Hispanic African American: Yes     Diabetic: Yes     Tobacco smoker: No     Systolic Blood Pressure: 570 mmHg     Is BP treated: Yes     HDL Cholesterol: 49 MG/DL     Total Cholesterol: 155 MG/DL   Patient has failed these meds in past: N/A Patient is currently controlled on the following medications:  . Simvastatin 34m daily  We discussed:   .Marland KitchenGoals for LDL, HDL, and triglycerides . Diet and exercise extensively o Minimize fried foods and fatty foods  Plan Continue current medications   Hypertension   BP goal is:  <140/90  Office blood pressures are  BP Readings from Last 3 Encounters:  05/14/20 138/78  05/14/20 138/78  04/27/20 (!) 126/57   Patient checks BP at home when feeling symptomatic Patient home BP readings are ranging: None to provide  Patient has failed these meds in the past: Furosemide, amlodipine, clonidine, losartan Patient is currently controlled on the following medications:  .Marland KitchenMetoprolol tartrate 232mthree tablets twice daily . Lisinopril 1020maily  We discussed: . Pt takes lisinopril at night . Pt states amlodipine was discontinued about a year ago by Dr. ColBlanche EastRecommend pt check BP weekly and if symptomatic   Plan Continue current medications   S/P Kidney Transplant   Patient has failed these meds in past: Bactrim,  Patient is currently controlled on the following medications:   Prograf 1mg92mcapsules in the morning, 6 capsules in the evening  Prednisone 5mg 37mly  Mycophenolate 180mg 86mblets twice  daily  We discussed:     Pt mentions that Prograf is no longer covered by insurance and costs about $500 per month  [facility]  Advised pt I could assist with completing form if needed  Discussed the option of changing to generic Tacrolimus if approved by nephrologist/transplant team  Advised pt that if she switched to generic she may have to get lab work more frequently and may require dosage adjustments  Plan Continue current medications  GERD   Patient has failed these meds in past: N/A Patient is currently controlled on the following medications:   Omeprazole 20mg d36m  We discussed:    Pt states she takes omeprazole as needed  Plan Continue current medications  Hypothyroidism   Lab Results  Component Value Date/Time   TSH 0.663 04/19/2019 03:49 PM   TSH 1.57 03/17/2017 12:00 AM   Patient has failed these meds in past: N/a Patient is currently controlled on the following medications:  . Levothyroxine 75mcg d35m in the morning  Plan Continue current medications  Health Maintenance   Patient is currently on the following medications:  . Aspirin 81mg dai12m Loperamide 2mg as ne13md for diarrhea and loose stools . Clotrimazole 1% cream apply to skin on both great toes once daily  We discussed:   . Pt has diarrhea occasionally from immunosuppressant medications but it is managed with loperamide as needed  o Uses loperamide typically 2-3 times weekly  . Pt states that she uses clotrimazole 2-3 times weekly for fungus o Pt states that fungus has cleared up. Advised her she does not have to use clotrimazole anymore  Plan Continue current medications  Vaccines   Reviewed and discussed patient's vaccination history.    Immunization History  Administered Date(s) Administered  . Influenza,inj,Quad PF,6+ Mos 04/07/2018  . Influenza-Unspecified  05/24/2015, 05/09/2018, 04/21/2020  . PFIZER SARS-COV-2 Vaccination 10/25/2019, 11/15/2019   We discussed:  Pt planning to receive Tdap at local pharmacy  Shingrix vaccine discussed at PCP office visit yesterday  Pt has had shingles twice already  Pneumonia vaccines  Plan Recommended patient receive Tdap and Pneumovax vaccine in the office. Encouraged pt the get Tdap and Shingrix vaccine  Medication Management   [facility] Uses pill box? Yes Pt endorses 90% compliance  We discussed:  . Importance of taking each medications daily as directed . Pt just bought 2 new pill boxes with larger sections . Pt may occasionally miss a dose of oral medications . Pt is working on getting medications through [employer] . Medication synchronization, adherence packaging, and delivery available with [facility]  Plan Continue current medication management strategy    Follow up: 1 month phone visit  Jannette Fogo, PharmD Clinical Pharmacist [facility] [phone]

## 2020-05-16 ENCOUNTER — Other Ambulatory Visit: Payer: Self-pay | Admitting: *Deleted

## 2020-05-16 NOTE — Patient Outreach (Signed)
°  [facility]) Care Management Chronic Special Needs Program    05/16/2020  Name: Rekha Hobbins, DOB: [birth year]  MRN: [id number]   Ms. Chisom Muntean is enrolled in a chronic special needs plan for Diabetes.  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  PLAN Outreach client in 1-2 weeks  Jacqlyn Larsen [facility], BSN [place], [place]

## 2020-05-16 NOTE — Patient Instructions (Addendum)
Visit Information  Goals Addressed            This Visit's Progress   . Pharmacy Care Plan       CARE PLAN ENTRY (see longitudinal plan of care for additional care plan information)  Current Barriers:  . Chronic Disease Management support, education, and care coordination needs related to Hypertension, Hyperlipidemia, and Diabetes   Hypertension BP Readings from Last 3 Encounters:  05/14/20 138/78  05/14/20 138/78  04/27/20 (!) 126/57   . Pharmacist Clinical Goal(s): o Over the next 180 days, patient will work with PharmD and providers to maintain BP goal <130/80 . Current regimen:  o Metoprolol tartrate 25mg  three tablets twice daily o Lisinopril 10mg  daily . Interventions: o Provided dietary and exercise recommendations o Recommend patient check blood pressure weekly and if symptomatic . Patient self care activities - Over the next 180 days, patient will: o Check BP weekly and if symptomatic, document, and provide at future appointments o Ensure daily salt intake < 2300 mg/day o Increase exercise as tolerating with a goal of 30 minutes daily 5 times per week (or 150 minutes total)  Hyperlipidemia Lab Results  Component Value Date/Time   LDLCALC 85 04/19/2019 03:49 PM   . Pharmacist Clinical Goal(s): o Over the next 90 days, patient will work with PharmD and providers to achieve LDL goal < 70 . Current regimen:  o Simvastatin 10mg  daily . Interventions: o Provided dietary and exercise recommendations o Discussed appropriate goals for LDL (less than 70), HDL (greater than 50), and triglycerides (less than 150) . Patient self care activities - Over the next 90 days, patient will: o Increase exercise as tolerating with a goal of 30 minutes daily 5 times per week (or 150 minutes total) o Limit fried and fatty foods  Diabetes Lab Results  Component Value Date/Time   HGBA1C 8.3 03/17/2017 12:00 AM   HGBA1C 6.6 (H) 12/08/2011 05:49 PM   . Pharmacist Clinical  Goal(s): o Over the next 90 days, patient will work with PharmD and providers to achieve A1c goal <7% . Current regimen:  o Levemir 100 units/ml 30 units in the morning and 35 units in the evening o Novolog Flexpen 100 units/ml 4 units three times daily (sliding scale) . Interventions: o Provided dietary and exercise recommendations o Recommend patient consistently check blood sugar before meal and use Novolog as directed - Recommend patient set alarms on her phone to check blood sugar or keep blood glucose meter near by . Patient self care activities - Over the next 90 days, patient will: o Check blood sugar 3-4 times daily, document, and provide at future appointments o Contact provider with any episodes of hypoglycemia o Increase exercise as tolerating with a goal of 30 minutes daily 5 times per week (or 150 minutes total)  Medication management . Pharmacist Clinical Goal(s): o Over the next 90 days, patient will work with PharmD and providers to achieve optimal medication adherence . [facility] . Interventions o Comprehensive medication review performed. o Continue current medication management strategy . Patient self care activities - Over the next 90 days, patient will: o Focus on medication adherence by continued use of pill box o Take medications as prescribed o Report any questions or concerns to PharmD and/or provider(s)  Initial goal documentation        Ms. Lemaster was given information about Chronic Care Management services today including:  1. CCM service includes personalized support from designated clinical staff supervised by her  physician, including individualized plan of care and coordination with other care providers 2. 24/7 contact phone numbers for assistance for urgent and routine care needs. 3. Standard insurance, coinsurance, copays and deductibles apply for chronic care management only during months in which we provide at least 20 minutes of  these services. Most insurances cover these services at 100%, however patients may be responsible for any copay, coinsurance and/or deductible if applicable. This service may help you avoid the need for more expensive face-to-face services. 4. Only one practitioner may furnish and bill the service in a calendar month. 5. The patient may stop CCM services at any time (effective at the end of the month) by phone call to the office staff.  Patient agreed to services and verbal consent obtained.   The patient verbalized understanding of instructions provided today and agreed to receive a mailed copy of patient instruction and/or educational materials. Telephone follow up appointment with pharmacy team member scheduled for: 06/16/20 @ 1:00 PM  Jannette Fogo, PharmD Clinical Pharmacist [facility] [phone]   Diabetes Mellitus and Nutrition, Adult When you have diabetes (diabetes mellitus), it is very important to have healthy eating habits because your blood sugar (glucose) levels are greatly affected by what you eat and drink. Eating healthy foods in the appropriate amounts, at about the same times every day, can help you:  Control your blood glucose.  Lower your risk of heart disease.  Improve your blood pressure.  Reach or maintain a healthy weight. Every person with diabetes is different, and each person has different needs for a meal plan. Your health care provider may recommend that you work with a diet and nutrition specialist (dietitian) to make a meal plan that is best for you. Your meal plan may vary depending on factors such as:  The calories you need.  The medicines you take.  Your weight.  Your blood glucose, blood pressure, and cholesterol levels.  Your activity level.  Other health conditions you have, such as heart or kidney disease. How do carbohydrates affect me? Carbohydrates, also called carbs, affect your blood glucose level more than  any other type of food. Eating carbs naturally raises the amount of glucose in your blood. Carb counting is a method for keeping track of how many carbs you eat. Counting carbs is important to keep your blood glucose at a healthy level, especially if you use insulin or take certain oral diabetes medicines. It is important to know how many carbs you can safely have in each meal. This is different for every person. Your dietitian can help you calculate how many carbs you should have at each meal and for each snack. Foods that contain carbs include:  Bread, cereal, rice, pasta, and crackers.  Potatoes and corn.  Peas, beans, and lentils.  Milk and yogurt.  Fruit and juice.  Desserts, such as cakes, cookies, ice cream, and candy. How does alcohol affect me? Alcohol can cause a sudden decrease in blood glucose (hypoglycemia), especially if you use insulin or take certain oral diabetes medicines. Hypoglycemia can be a life-threatening condition. Symptoms of hypoglycemia (sleepiness, dizziness, and confusion) are similar to symptoms of having too much alcohol. If your health care provider says that alcohol is safe for you, follow these guidelines:  Limit alcohol intake to no more than 1 drink per day for nonpregnant women and 2 drinks per day for men. One drink equals 12 oz of beer, 5 oz of wine, or 1 oz of hard liquor.  Do not drink on an empty stomach.  Keep yourself hydrated with water, diet soda, or unsweetened iced tea.  Keep in mind that regular soda, juice, and other mixers may contain a lot of sugar and must be counted as carbs. What are tips for following this plan?  Reading food labels  Start by checking the serving size on the "Nutrition Facts" label of packaged foods and drinks. The amount of calories, carbs, fats, and other nutrients listed on the label is based on one serving of the item. Many items contain more than one serving per package.  Check the total grams (g) of carbs  in one serving. You can calculate the number of servings of carbs in one serving by dividing the total carbs by 15. For example, if a food has 30 g of total carbs, it would be equal to 2 servings of carbs.  Check the number of grams (g) of saturated and trans fats in one serving. Choose foods that have low or no amount of these fats.  Check the number of milligrams (mg) of salt (sodium) in one serving. Most people should limit total sodium intake to less than 2,300 mg per day.  Always check the nutrition information of foods labeled as "low-fat" or "nonfat". These foods may be higher in added sugar or refined carbs and should be avoided.  Talk to your dietitian to identify your daily goals for nutrients listed on the label. Shopping  Avoid buying canned, premade, or processed foods. These foods tend to be high in fat, sodium, and added sugar.  Shop around the outside edge of the grocery store. This includes fresh fruits and vegetables, bulk grains, fresh meats, and fresh dairy. Cooking  Use low-heat cooking methods, such as baking, instead of high-heat cooking methods like deep frying.  Cook using healthy oils, such as olive, canola, or sunflower oil.  Avoid cooking with butter, cream, or high-fat meats. Meal planning  Eat meals and snacks regularly, preferably at the same times every day. Avoid going long periods of time without eating.  Eat foods high in fiber, such as fresh fruits, vegetables, beans, and whole grains. Talk to your dietitian about how many servings of carbs you can eat at each meal.  Eat 4-6 ounces (oz) of lean protein each day, such as lean meat, chicken, fish, eggs, or tofu. One oz of lean protein is equal to: ? 1 oz of meat, chicken, or fish. ? 1 egg. ?  cup of tofu.  Eat some foods each day that contain healthy fats, such as avocado, nuts, seeds, and fish. Lifestyle  Check your blood glucose regularly.  Exercise regularly as told by your health care  provider. This may include: ? 150 minutes of moderate-intensity or vigorous-intensity exercise each week. This could be brisk walking, biking, or water aerobics. ? Stretching and doing strength exercises, such as yoga or weightlifting, at least 2 times a week.  Take medicines as told by your health care provider.  Do not use any products that contain nicotine or tobacco, such as cigarettes and e-cigarettes. If you need help quitting, ask your health care provider.  Work with a Social worker or diabetes educator to identify strategies to manage stress and any emotional and social challenges. Questions to ask a health care provider  Do I need to meet with a diabetes educator?  Do I need to meet with a dietitian?  What number can I call if I have questions?  When are the best times to  check my blood glucose? Where to find more information:  American Diabetes Association: diabetes.org  Academy of Nutrition and Dietetics: www.eatright.[employer] of Diabetes and Digestive and Kidney Diseases (NIH): [employer] Summary  A healthy meal plan will help you control your blood glucose and maintain a healthy lifestyle.  Working with a diet and nutrition specialist (dietitian) can help you make a meal plan that is best for you.  Keep in mind that carbohydrates (carbs) and alcohol have immediate effects on your blood glucose levels. It is important to count carbs and to use alcohol carefully. This information is not intended to replace advice given to you by your health care provider. Make sure you discuss any questions you have with your health care provider. Document Revised: 07/08/2017 Document Reviewed: 08/30/2016 Elsevier Patient Education  2020 [place].  Diabetes Mellitus and Exercise Exercising regularly is important for your overall health, especially when you have diabetes (diabetes mellitus). Exercising is not only about losing weight. It has many other health  benefits, such as increasing muscle strength and bone density and reducing body fat and stress. This leads to improved fitness, flexibility, and endurance, all of which result in better overall health. Exercise has additional benefits for people with diabetes, including:  Reducing appetite.  Helping to lower and control blood glucose.  Lowering blood pressure.  Helping to control amounts of fatty substances (lipids) in the blood, such as cholesterol and triglycerides.  Helping the body to respond better to insulin (improving insulin sensitivity).  Reducing how much insulin the body needs.  Decreasing the risk for heart disease by: ? Lowering cholesterol and triglyceride levels. ? Increasing the levels of good cholesterol. ? Lowering blood glucose levels. What is my activity plan? Your health care provider or certified diabetes educator can help you make a plan for the type and frequency of exercise (activity plan) that works for you. Make sure that you:  Do at least 150 minutes of moderate-intensity or vigorous-intensity exercise each week. This could be brisk walking, biking, or water aerobics. ? Do stretching and strength exercises, such as yoga or weightlifting, at least 2 times a week. ? Spread out your activity over at least 3 days of the week.  Get some form of physical activity every day. ? Do not go more than 2 days in a row without some kind of physical activity. ? Avoid being inactive for more than 30 minutes at a time. Take frequent breaks to walk or stretch.  Choose a type of exercise or activity that you enjoy, and set realistic goals.  Start slowly, and gradually increase the intensity of your exercise over time. What do I need to know about managing my diabetes?   Check your blood glucose before and after exercising. ? If your blood glucose is 240 mg/dL (13.3 mmol/L) or higher before you exercise, check your urine for ketones. If you have ketones in your urine, do  not exercise until your blood glucose returns to normal. ? If your blood glucose is 100 mg/dL (5.6 mmol/L) or lower, eat a snack containing 15-20 grams of carbohydrate. Check your blood glucose 15 minutes after the snack to make sure that your level is above 100 mg/dL (5.6 mmol/L) before you start your exercise.  Know the symptoms of low blood glucose (hypoglycemia) and how to treat it. Your risk for hypoglycemia increases during and after exercise. Common symptoms of hypoglycemia can include: ? Hunger. ? Anxiety. ? Sweating and feeling clammy. ? Confusion. ? Dizziness  or feeling light-headed. ? Increased heart rate or palpitations. ? Blurry vision. ? Tingling or numbness around the mouth, lips, or tongue. ? Tremors or shakes. ? Irritability.  Keep a rapid-acting carbohydrate snack available before, during, and after exercise to help prevent or treat hypoglycemia.  Avoid injecting insulin into areas of the body that are going to be exercised. For example, avoid injecting insulin into: ? The arms, when playing tennis. ? The legs, when jogging.  Keep records of your exercise habits. Doing this can help you and your health care provider adjust your diabetes management plan as needed. Write down: ? Food that you eat before and after you exercise. ? Blood glucose levels before and after you exercise. ? The type and amount of exercise you have done. ? When your insulin is expected to peak, if you use insulin. Avoid exercising at times when your insulin is peaking.  When you start a new exercise or activity, work with your health care provider to make sure the activity is safe for you, and to adjust your insulin, medicines, or food intake as needed.  Drink plenty of water while you exercise to prevent dehydration or heat stroke. Drink enough fluid to keep your urine clear or pale yellow. Summary  Exercising regularly is important for your overall health, especially when you have diabetes  (diabetes mellitus).  Exercising has many health benefits, such as increasing muscle strength and bone density and reducing body fat and stress.  Your health care provider or certified diabetes educator can help you make a plan for the type and frequency of exercise (activity plan) that works for you.  When you start a new exercise or activity, work with your health care provider to make sure the activity is safe for you, and to adjust your insulin, medicines, or food intake as needed. This information is not intended to replace advice given to you by your health care provider. Make sure you discuss any questions you have with your health care provider. Document Revised: 02/17/2017 Document Reviewed: 01/05/2016 Elsevier Patient Education  [place].

## 2020-05-17 LAB — URINE CULTURE

## 2020-05-26 ENCOUNTER — Other Ambulatory Visit: Payer: Self-pay | Admitting: *Deleted

## 2020-05-26 NOTE — Patient Outreach (Signed)
  [facility]) Care Management Chronic Special Needs Program    05/26/2020  Name: Aariona Momon, DOB: 09-05-1955  MRN: [id number]   Ms. Raley Novicki is enrolled in a chronic special needs plan for Diabetes.  Outreach call to client, spoke with client who reports " it's not a good day to talk, I'm taking my mother home"  Client requests call back another day.  PLAN Outreach client in 1-2 weeks  Jacqlyn Larsen [facility], BSN [place], [place]

## 2020-06-02 ENCOUNTER — Other Ambulatory Visit: Payer: Self-pay

## 2020-06-02 ENCOUNTER — Ambulatory Visit
Admission: RE | Admit: 2020-06-02 | Discharge: 2020-06-02 | Disposition: A | Discharge status: Home / Self Care | Payer: HMO | Source: Ambulatory Visit | Attending: Internal Medicine | Admitting: Internal Medicine

## 2020-06-02 DIAGNOSIS — Z1231 Encounter for screening mammogram for malignant neoplasm of breast: Secondary | ICD-10-CM

## 2020-06-03 ENCOUNTER — Other Ambulatory Visit: Payer: Self-pay | Admitting: *Deleted

## 2020-06-03 NOTE — Patient Outreach (Signed)
[facility]) Care Management Chronic Special Needs Program    06/03/2020  Name: [name] [name], [place]: [birth year]  MRN: [id number]   Ms. Tecora Eustache is enrolled in a chronic special needs plan for Diabetes.  Outreach call to client for initial telephone assessment, no answer to telephone (3rd attempt). RN care manager created individualized care plan for client.  Goals    . Client understands the importance of follow-up with providers by attending scheduled visits     Please attend all scheduled appointments with health care provider    . HEMOGLOBIN A1C < 7     Your last documented AIC is 9.3 on 02/26/20.  Have your [facility] checked every 6 months if you are at goal or every 3 months if you are not at goal. Check blood sugars daily before eating with goal of 80-130.  You can also check 1 1/2 hours after eating with goal of 180 or less. Plan to eat low carbohydrate and low salt meals, watch portion sizes and avoid sugar sweetened drinks.  Discussed carbohydrate control meals. Reviewed signs and symptoms of hyperglycemia (high blood sugar) and hypoglycemia (low blood sugar) and actions to take. Review [employer] calendar (sent in the mail) for diabetes action plan in the back. Increase activity only if you are able to do it.  Follow doctor recommendations. EMMI education provided on "Diabetes and Diet".  Review and plan to discuss with RN during next telephonic assessment.  Use 24 hour nurse advice line as needed at [phone]      . Maintain timely refills of diabetic medication as prescribed within the year .     Contact your RN care manager if you have questions about medicines. Medication review completed from EMR information. It is important to take your medications as prescribed.      . Obtain annual  Lipid Profile, LDL-C     Per medical record review, Lipid profile completed on 04/19/19 LDL= 85 The goal for LDL is less than 70mg /dl as  you are at high risk for complications. Try to avoid saturated fats, trans-fats and eat more fiber. Plan to take statin (cholesterol) medicine as ordered.      . Obtain Annual Eye (retinal)  Exam      Your last documented eye exam was on 05/08/20 Diabetes can affect your vision.  Plan to have a dilated eye exam every year. Advised client to keep and/ or schedule appointment with eye doctor. Continue to use your eye drops as prescribed.     . Obtain Annual Foot Exam     Your doctor should check your bare feet at each visit. Diabetes can affect the nerves in your feet, causing decreased feeling or numbness. Check your feet and in-between toes daily for cuts, bruises, redness, blisters or sores.  If you cannot reach them, use a mirror. Wash feet with soap and water, dry feet well especially between toes.  Don't use too much lotion. Wear shoes that are not too tight and don't walk barefoot. EMMI education articles provided "Foot Care for Diabetics"   Review and plan to discuss with RN during next telephonic assessment.      . Obtain annual screen for micro albuminuria (urine) , nephropathy (kidney problems)     Diabetes can affect your kidneys. It is important for your doctor to check your urine at least once a year  These tests show how your kidneys are working.     . Obtain Hemoglobin A1C at  least 2 times per year     Continue having Hgb AIC checked at least twice yearly    . Patient Stated     05/14/2020, exercise more and stay healthy    . Pharmacy Care Plan     CARE PLAN ENTRY (see longitudinal plan of care for additional care plan information)  Current Barriers:  . Chronic Disease Management support, education, and care coordination needs related to Hypertension, Hyperlipidemia, and Diabetes   Hypertension BP Readings from Last 3 Encounters:  05/14/20 138/78  05/14/20 138/78  04/27/20 (!) 126/57   . Pharmacist Clinical Goal(s): o Over the next 180 days, patient will  work with PharmD and providers to maintain BP goal <130/80 . Current regimen:  o Metoprolol tartrate 25mg  three tablets twice daily o Lisinopril 10mg  daily . Interventions: o Provided dietary and exercise recommendations o Recommend patient check blood pressure weekly and if symptomatic . Patient self care activities - Over the next 180 days, patient will: o Check BP weekly and if symptomatic, document, and provide at future appointments o Ensure daily salt intake < 2300 mg/day o Increase exercise as tolerating with a goal of 30 minutes daily 5 times per week (or 150 minutes total)  Hyperlipidemia Lab Results  Component Value Date/Time   LDLCALC 85 04/19/2019 03:49 PM   . Pharmacist Clinical Goal(s): o Over the next 90 days, patient will work with PharmD and providers to achieve LDL goal < 70 . Current regimen:  o Simvastatin 10mg  daily . Interventions: o Provided dietary and exercise recommendations o Discussed appropriate goals for LDL (less than 70), HDL (greater than 50), and triglycerides (less than 150) . Patient self care activities - Over the next 90 days, patient will: o Increase exercise as tolerating with a goal of 30 minutes daily 5 times per week (or 150 minutes total) o Limit fried and fatty foods  Diabetes Lab Results  Component Value Date/Time   HGBA1C 8.3 03/17/2017 12:00 AM   HGBA1C 6.6 (H) 12/08/2011 05:49 PM   . Pharmacist Clinical Goal(s): o Over the next 90 days, patient will work with PharmD and providers to achieve A1c goal <7% . Current regimen:  o Levemir 100 units/ml 30 units in the morning and 35 units in the evening o Novolog Flexpen 100 units/ml 4 units three times daily (sliding scale) . Interventions: o Provided dietary and exercise recommendations o Recommend patient consistently check blood sugar before meal and use Novolog as directed - Recommend patient set alarms on her phone to check blood sugar or keep blood glucose meter near  by . Patient self care activities - Over the next 90 days, patient will: o Check blood sugar 3-4 times daily, document, and provide at future appointments o Contact provider with any episodes of hypoglycemia o Increase exercise as tolerating with a goal of 30 minutes daily 5 times per week (or 150 minutes total)  Medication management . Pharmacist Clinical Goal(s): o Over the next 90 days, patient will work with PharmD and providers to achieve optimal medication adherence . [facility] . Interventions o Comprehensive medication review performed. o Continue current medication management strategy . Patient self care activities - Over the next 90 days, patient will: o Focus on medication adherence by continued use of pill box o Take medications as prescribed o Report any questions or concerns to PharmD and/or provider(s)  Initial goal documentation     . Visit Primary Care Provider or Endocrinologist at least 2 times per year  Continue seeing primary care provider and endocrinologist as scheduled at least twice yearly    . Weight (lb) < 200 lb (90.7 kg)     Patient wants to lose around 20 lbs       PLAN RN care manager faxed today's note with individualized care plan to primary care provider, mailed individualized care plan to client along with unsuccessful outreach letter including educational materials, 24 hour nurse advice line magnet, HTA calendar. Outreach client in 9-12 months  Jacqlyn Larsen [facility], BSN [place], [place]

## 2020-06-16 ENCOUNTER — Telehealth: Payer: Self-pay

## 2020-06-24 ENCOUNTER — Ambulatory Visit: Payer: Medicare HMO | Admitting: Podiatry

## 2020-06-27 ENCOUNTER — Other Ambulatory Visit: Payer: Self-pay

## 2020-06-27 ENCOUNTER — Encounter: Payer: Self-pay | Admitting: Podiatry

## 2020-06-27 ENCOUNTER — Ambulatory Visit: Payer: Medicare HMO | Admitting: Podiatry

## 2020-06-27 DIAGNOSIS — E1122 Type 2 diabetes mellitus with diabetic chronic kidney disease: Secondary | ICD-10-CM

## 2020-06-27 DIAGNOSIS — N185 Chronic kidney disease, stage 5: Secondary | ICD-10-CM

## 2020-06-27 DIAGNOSIS — L600 Ingrowing nail: Secondary | ICD-10-CM

## 2020-06-27 DIAGNOSIS — M79674 Pain in right toe(s): Secondary | ICD-10-CM

## 2020-06-27 DIAGNOSIS — B351 Tinea unguium: Secondary | ICD-10-CM | POA: Diagnosis not present

## 2020-06-27 DIAGNOSIS — M79675 Pain in left toe(s): Secondary | ICD-10-CM

## 2020-06-27 DIAGNOSIS — Z794 Long term (current) use of insulin: Secondary | ICD-10-CM

## 2020-06-29 NOTE — Progress Notes (Signed)
Subjective: [name] [name] presents today at risk foot care. Patient has history of NIDDM, s/p renal transplant, immunocompromised is seen for painful mycotic nails b/l that are difficult to trim. Pain interferes with ambulation. Aggravating factors include wearing enclosed shoe gear. Pain is relieved with periodic professional debridement  Glendale Chard, MD is patient's PCP. Last visit was: 05/14/2020.  [name] [name] states this will be her first holiday without her husband.  She relates no new pedal concerns on today's visit.  Past Medical History:  Diagnosis Date  . Blood transfusion   . Chronic kidney disease    s/p transplant, kidney wasn't removed  . Gout due to renal impairment 2010   "before finding out I had kidney failure"  . History of renal transplant   . Hyperlipidemia   . Hypertension   . Sickle cell trait ([place])   . Thyroid disease   . Type II diabetes mellitus ([place])   . UTI (lower urinary tract infection) 12/08/11   "first one ever"     Current Outpatient Medications on File Prior to Visit  Medication Sig Dispense Refill  . Alcohol Swabs (ALCOHOL PREP) PADS Use as directed 5 times per day with insulin pen 600 each 11  . amLODipine (NORVASC) 5 MG tablet Take 5 mg by mouth daily.     Marland Kitchen aspirin EC 81 MG tablet Take 81 mg by mouth daily.    Marland Kitchen BAQSIMI ONE PACK 3 MG/DOSE POWD Place into both nostrils daily as needed.    . Blood Glucose Monitoring Suppl (ACCU-CHEK GUIDE ME) w/Device KIT USE AS DIRECTED ONCE A DAY    . Blood Glucose Monitoring Suppl (ACCU-CHEK GUIDE) w/Device KIT USE AS DIRECTED ONCE A DAY    . clotrimazole (LOTRIMIN) 1 % cream Apply to skin on both great toes once daily 45 g 1  . diazepam (VALIUM) 2 MG tablet Take 1 tablet (2 mg total) by mouth every 12 (twelve) hours as needed for muscle spasms. 20 tablet 0  . glucose blood (ONETOUCH VERIO) test strip USE TO TEST BLOOD SUGAR 4 TIMES DAILY AS DIRECTED    . Insulin Aspart (NOVOLOG FLEXPEN [place]) Inject  4 Units into the skin 3 (three) times daily. Sliding scale    . Insulin Detemir (LEVEMIR FLEXTOUCH) 100 UNIT/ML Pen 30 units in the morning and 35 units in the evening    . levothyroxine (SYNTHROID) 75 MCG tablet TAKE 1 TABLET BY MOUTH EVERY MORNING ON AN EMPTY STOMACH 90    . lisinopril (PRINIVIL,ZESTRIL) 10 MG tablet Take by mouth.    . loperamide (IMODIUM) 2 MG capsule Take by mouth as needed for diarrhea or loose stools. One by mouth four times daily.    . metoprolol tartrate (LOPRESSOR) 25 MG tablet Take 75 mg by mouth 2 (two) times daily. 3 tabs 2 times per day    . mometasone (NASONEX) 50 MCG/ACT nasal spray Place 2 sprays into the nose daily. 17 g 12  . mycophenolate (MYFORTIC) 180 MG EC tablet Take 360 mg by mouth 2 (two) times daily.     . NONFORMULARY OR COMPOUNDED ITEM Antifungal solution: Terbinafine 3%, Fluconazole 2%, Tea Tree Oil 5%, Urea 10%, Ibuprofen 2% in DMSO suspension #2m 1 each 3  . omeprazole (PRILOSEC) 20 MG capsule Take 20 mg by mouth daily.    . predniSONE (DELTASONE) 5 MG tablet Take 5 mg by mouth daily with breakfast.    . simvastatin (ZOCOR) 10 MG tablet TAKE 1 TABLET BY MOUTH EVERY DAY IN  THE EVENING 90 tablet 1  . tacrolimus (PROGRAF) 1 MG capsule 7 capsules in the morning, 6 capsules in the evening     No current facility-administered medications on file prior to visit.     Allergies  Allergen Reactions  . Codeine Hives  . Acetaminophen-Codeine Rash   Objective: [name] [name] is a pleasant 64 y.o. y.o. Patient Race: Black or African American [2]  female in NAD. AAO x 3.  There were no vitals filed for this visit.  Vascular Examination: Neurovascular status unchanged b/l. Capillary fill time to digits <3 seconds b/l. Palpable DP pulses b/l. Palpable PT pulses b/l. Pedal hair absent b/l Skin temperature gradient within normal limits b/l. No edema noted b/l.  Dermatological Examination: Pedal skin with normal turgor, texture and tone  bilaterally. No open wounds bilaterally. No interdigital macerations bilaterally. Toenails L hallux and R hallux elongated, discolored, dystrophic, thickened, and crumbly with subungual debris and tenderness to dorsal palpation. Incurvated nailplate bilateral borders border(s) L hallux and R hallux.  Nail border hypertrophy present. There is tenderness to palpation. Sign(s) of infection: no clinical signs of infection noted on examination today..  Musculoskeletal: Normal muscle strength 5/5 to all lower extremity muscle groups bilaterally. No gross bony deformities bilaterally. No pain crepitus or joint limitation noted with ROM b/l. No pain on ROM right ankle.   Neurological Examination: Protective sensation intact 5/5 intact bilaterally with 10g monofilament b/l. Vibratory sensation intact b/l.  Assessment: 1. Pain due to onychomycosis of toenails of both feet   2. Ingrown toenail without infection   3. Type 2 diabetes mellitus with stage 5 chronic kidney disease not on chronic dialysis, with long-term current use of insulin ([place])    Plan: -Examined patient. -No new findings. No new orders. -Regarding right ankle spasms, we discussed adequate hydration. Will revisit to see if symptoms resolve. -Continue diabetic foot care principles. Literature dispensed on today.  -Toenails L hallux and R hallux debrided in length and girth without iatrogenic bleeding with sterile nail nipper and dremel. Offending nail borders debrided and curretaged L hallux and R hallux. Borders cleansed with alcohol. Antibiotic ointment applied. She was instructed to apply Neosporin to both great toes once daily for one week. -Patient to continue soft, supportive shoe gear daily. -Patient to report any pedal injuries to medical professional immediately. -Patient/POA to call should there be question/concern in the interim.  Return in about 3 months (around 09/27/2020) for diabetic foot care.  Marzetta Board, DPM

## 2020-07-09 ENCOUNTER — Ambulatory Visit: Payer: Self-pay

## 2020-07-09 DIAGNOSIS — Z794 Long term (current) use of insulin: Secondary | ICD-10-CM

## 2020-07-09 DIAGNOSIS — E1122 Type 2 diabetes mellitus with diabetic chronic kidney disease: Secondary | ICD-10-CM

## 2020-07-09 DIAGNOSIS — I10 Essential (primary) hypertension: Secondary | ICD-10-CM

## 2020-07-09 DIAGNOSIS — E7800 Pure hypercholesterolemia, unspecified: Secondary | ICD-10-CM

## 2020-07-09 NOTE — Chronic Care Management (AMB) (Signed)
Chronic Care Management Pharmacy  Name: [name] [name]  MRN: [id number] DOB: 1955/12/07  Chief Complaint/ HPI  [name] [name],  64 y.o. , female presents for their Initial CCM visit with the clinical pharmacist via telephone due to COVID-19 Pandemic. She went to [employer] and received her BS in special education and went to [employer] and got her masters degree. 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 In her 29th year she was diagnosed with kidney failure and took early retirement. She started dialysis in 2008 once she was diagnosed with ESRD.  She received her first kidney in 2010 and went back on dialysis  2014. She received her next kidney in 2016 and it is still working. She lost her husband in July of 2021. She has two daughters. 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 She spends a lot of time her family.  She tries to stick to a routine and does house work she is not as physically active as she used to be due to the pandemic. She is working on thank you cards from her birthday. She crochets during the day and tries to stay busy.   PCP : Glendale Chard, MD  Their chronic conditions include: Hypertension, Hyperlipidemia, Diabetes and Chronic Kidney Disease  Office Visits: 05/14/20 AWV and OV: Annual physical exam. HTN follow up.   Consult Visits: 06/23/2020- Recent visit with nephrologist and labs were completed.   05/08/20 Ophthalmology OV w/ Dr. Zadie Rhine: No diabetic retinopathy. Discussed posterior vitreous detachment of the left eye. Advised pt to contact office if new or different flashes or floaters develop.   04/27/20 ED: Presents with fever, chills,nausea, vomiting and diarrhea. Began to feel ill about a week ago. Given Augmentin for UTI.   04/04/20 Transplant OV w/ Dr. Wess Botts: Presented for annual visit s/p DDRT on 03/29/15 (second transplant). Continues labs every 2 months  and visit with nephrologist every 3-6 months. Continue FK, Myfortic 375m BID, prednisone 515mdaily. Continue Bactrim indefinitely. Consider CGM and GLP1, follow up with endocrinology. Continue PPI for GERD. Okay to take BID if needed. Encourage to supplement with Tums as needed. Continue Sensipar 302mUse Imodium as needed for loose stools.   03/21/20 Podiatry OV w/ Dr. GalElisha Ponderontinue diabetic foot care principles. Toenails left and right hallux debrided. Continue soft, supportive shoe gear. Regarding right ankle spasms, discussed adequate hydration. Return in 30 months for diabetic nail trim.   12/19/19 Podiatry OV w/ Dr. GalElisha Ponderontinue diabetic foot care principles. Toenails left and right hallux debrided. Continue soft, supportive shoe gear. Return in 30 months for diabetic nail trim.   CCM Encounters:  Medications: Outpatient Encounter Medications as of 07/09/2020  Medication Sig Note  . Alcohol Swabs (ALCOHOL PREP) PADS Use as directed 5 times per day with insulin pen   . amLODipine (NORVASC) 5 MG tablet Take 5 mg by mouth daily.    . aMarland Kitchenpirin EC 81 MG tablet Take 81 mg by mouth daily.   . BMarland KitchenQSIMI ONE PACK 3 MG/DOSE POWD Place into both nostrils daily as needed.   . Blood Glucose Monitoring Suppl (ACCU-CHEK GUIDE ME) w/Device KIT USE AS DIRECTED ONCE A DAY   . Blood Glucose Monitoring Suppl (ACCU-CHEK GUIDE) w/Device KIT USE AS DIRECTED ONCE A DAY   . clotrimazole (LOTRIMIN) 1 % cream Apply to skin on both great toes once daily   . diazepam (VALIUM)  2 MG tablet Take 1 tablet (2 mg total) by mouth every 12 (twelve) hours as needed for muscle spasms.   Marland Kitchen glucose blood (ONETOUCH VERIO) test strip USE TO TEST BLOOD SUGAR 4 TIMES DAILY AS DIRECTED   . Insulin Aspart (NOVOLOG FLEXPEN [place]) Inject 4 Units into the skin 3 (three) times daily. Sliding scale 11/16/2017: Pt reports 4 units, 3 times daily   . Insulin Detemir (LEVEMIR FLEXTOUCH) 100 UNIT/ML Pen 30 units in the morning and 35 units in  the evening   . levothyroxine (SYNTHROID) 75 MCG tablet TAKE 1 TABLET BY MOUTH EVERY MORNING ON AN EMPTY STOMACH 90   . lisinopril (PRINIVIL,ZESTRIL) 10 MG tablet Take by mouth.   . loperamide (IMODIUM) 2 MG capsule Take by mouth as needed for diarrhea or loose stools. One by mouth four times daily.   . metoprolol tartrate (LOPRESSOR) 25 MG tablet Take 75 mg by mouth 2 (two) times daily. 3 tabs 2 times per day   . mometasone (NASONEX) 50 MCG/ACT nasal spray Place 2 sprays into the nose daily.   . mycophenolate (MYFORTIC) 180 MG EC tablet Take 360 mg by mouth 2 (two) times daily.    . NONFORMULARY OR COMPOUNDED ITEM Antifungal solution: Terbinafine 3%, Fluconazole 2%, Tea Tree Oil 5%, Urea 10%, Ibuprofen 2% in DMSO suspension #64m   . omeprazole (PRILOSEC) 20 MG capsule Take 20 mg by mouth daily.   . predniSONE (DELTASONE) 5 MG tablet Take 5 mg by mouth daily with breakfast.   . simvastatin (ZOCOR) 10 MG tablet TAKE 1 TABLET BY MOUTH EVERY DAY IN THE EVENING   . tacrolimus (PROGRAF) 1 MG capsule 7 capsules in the morning, 6 capsules in the evening 11/16/2017: Pt reports taking 7 in the morning and 6 at night.    No facility-administered encounter medications on file as of 07/09/2020.    Current Diagnosis/Assessment:    Goals Addressed            This Visit's Progress   . Pharmacy Care Plan       CARE PLAN ENTRY (see longitudinal plan of care for additional care plan information)  Current Barriers:  . Chronic Disease Management support, education, and care coordination needs related to Hypertension, Hyperlipidemia, and Diabetes   Hypertension BP Readings from Last 3 Encounters:  05/14/20 138/78  05/14/20 138/78  04/27/20 (!) 126/57   . Pharmacist Clinical Goal(s): o Over the next 180 days, patient will work with PharmD and providers to maintain BP goal <130/80 . Current regimen:  o Metoprolol tartrate 229mthree tablets twice daily o Lisinopril 1011mdaily . Interventions: o Provided dietary and exercise recommendations o Recommend patient check blood pressure weekly and if symptomatic . Patient self care activities - Over the next 180 days, patient will: o Check BP 2-3 times per week and if symptomatic, document, and provide at future appointments o Ensure daily salt intake < 2300 mg/day o Increase exercise as tolerating with a goal of 30 minutes daily 5 times per week (or 150 minutes total)  Hyperlipidemia Lab Results  Component Value Date/Time   LDLCALC 85 04/19/2019 03:49 PM   . Pharmacist Clinical Goal(s): o Over the next 90 days, patient will work with PharmD and providers to achieve LDL goal < 70 . Current regimen:  o Simvastatin 92m80mily . Interventions: o Provided dietary and exercise recommendations o Discussed appropriate goals for LDL (less than 70), HDL (greater than 50), and triglycerides (less than 150) . Patient self care  activities - Over the next 90 days, patient will: o Increase exercise as tolerating with a goal of 30 minutes daily 5 times per week (or 150 minutes total) o Replacing potato chips with vegetable chips  Diabetes Lab Results  Component Value Date/Time   HGBA1C 8.3 03/17/2017 12:00 AM   HGBA1C 6.6 (H) 12/08/2011 05:49 PM   . Pharmacist Clinical Goal(s): o Over the next 90 days, patient will work with PharmD and providers to achieve A1c goal <7% . Current regimen:  o Levemir 100 units/ml 30 units in the morning and 35 units in the evening o Novolog Flexpen 100 units/ml 4 units three times daily (sliding scale) . Interventions: o Provided dietary and exercise recommendations - Patient is going to stop eating canned fruit and fruit cups and replace it with fresh fruit and frozen fruit  o Recommend patient consistently check blood sugar before meal and use Novolog as directed - Recommend patient set alarms on her phone to check blood sugar or keep blood glucose meter near by . Patient self  care activities - Over the next 90 days, patient will: o Check blood sugar 3-4 times daily, document, and provide at future appointments o Contact provider with any episodes of hypoglycemia o Increase exercise as tolerating with a goal of 30 minutes daily 5 times per week (or 150 minutes total)  Medication management . Pharmacist Clinical Goal(s): o Over the next 90 days, patient will work with PharmD and providers to achieve optimal medication adherence . Current pharmacy: [facility] . Interventions o Comprehensive medication review performed. o Continue current medication management strategy . Patient self care activities - Over the next 90 days, patient will: o Focus on medication adherence by continued use of pill box o Take medications as prescribed o Report any questions or concerns to PharmD and/or provider(s)  Initial goal documentation        Diabetes   A1c goal <7%  Recent Relevant Labs: Lab Results  Component Value Date/Time   HGBA1C 8.3 03/17/2017 12:00 AM   HGBA1C 6.6 (H) 12/08/2011 05:49 PM   MICROALBUR 150 05/14/2020 05:29 PM    Last diabetic Eye exam:  Lab Results  Component Value Date/Time   HMDIABEYEEXA No Retinopathy 01/30/2019 12:00 AM    Last diabetic Foot exam: 05/14/20  Checking BG: 3x per Day  Recent FBG Readings: 130-90 Recent pre-meal BG readings:  Recent 2hr PP BG readings:   Recent HS BG readings:   Patient has failed these meds in past: Lantus, Actos Patient is currently uncontrolled on the following medications: . Levemir 100 units/ml 30 units in the morning and 35 units in the evening . Novolog Flexpen 100 units/ml 4 units three times daily (sliding scale) . Baqsimi use in both nostrils as needed  We discussed:  . The importance of monitoring BS and keeping it within range o She is aware that her BS should not fall below 70  o She has not had to use her Baqsimi use in both nostrils as needed.  . Pt is not checking BoG consistently three  times daily . Pt states that if she doesn't check her BG then she usually doesn't administer her Novolog o Recommend pt check BG consistently and administer Novolog with each meal o Advised pt to set alarms on her phone to check BG or make sure her BG meter is near her around meal times . Diet extensively o Patient reports that she does not eat breakfast she might have a coffee cake.  o She tries to eat fruit sometimes canned fruit.  - Recommended the patient stop eating canned fruit and fruit cups  . She is going to start using the frozen fruits and fresh fruit variety packs.  o Pt's biggest meal is dinner - She is usually eating a meat and 2 vegetables. - She cooks at home and does not eat out often   . Exercise extensively o Patient reports that she used to work out with her husband but has not had motivation since her husband passed.  o Recommend pt get 30 minutes of moderate intensity exercise daily 5 times a week (150 minutes total per week) - Start slow at 10-15 minutes 2-3 days per week  Plan Continue current medications patient needs labs drawn.   Hyperlipidemia   LDL goal < 70  Lipid Panel     Component Value Date/Time   CHOL 180 04/19/2019 1549   TRIG 263 (H) 04/19/2019 1549   HDL 52 04/19/2019 1549   LDLCALC 85 04/19/2019 1549    Hepatic Function Latest Ref Rng & Units 04/27/2020 08/21/2018 03/17/2017  Total Protein 6.5 - 8.1 g/dL 7.9 7.3 -  Albumin 3.5 - 5.0 g/dL 3.6 4.2 -  AST 15 - 41 U/L 11(L) 10 12(A)  ALT 0 - 44 U/L '12 10 16  ' Alk Phosphatase 38 - 126 U/L 73 85 90  Total Bilirubin 0.3 - 1.2 mg/dL 1.3(H) 0.5 -     The 10-year ASCVD risk score Mikey Bussing DC Jr., et al., 2013) is: 20%   Values used to calculate the score:     Age: 34 years     Sex: Female     Is Non-Hispanic African American: Yes     Diabetic: Yes     Tobacco smoker: No     Systolic Blood Pressure: 474 mmHg     Is BP treated: Yes     HDL Cholesterol: 49 MG/DL     Total Cholesterol: 155 MG/DL      Patient has failed these meds in past: N/A Patient is currently uncontrolled on the following    Lipid Profile (04/04/2020 9:38 AM EDT) - From Nephrology consult  Lipid Profile (04/04/2020 9:38 AM EDT)  Component Value Ref Range    LDL Direct 85 <130 mg/dL    Total Cholesterol 155 25 - 199 MG/DL    Triglycerides 135 10 - 150 MG/DL    HDL Cholesterol 49 35 - 135 MG/DL     g medications:  . Simvastatin 38m daily  We discussed:   .Marland KitchenGoals for LDL, HDL, and triglycerides . Diet and exercise extensively o She had a nutritionist at dialysis that helped her with eating and was told to cut back on deli meat.  o Avoiding chips and dip and replacing it with raw vegetables and small serving of dip.  -  She is amicable to this idea.   Plan Continue current medications   Hypertension   BP goal is:  <140/90  Office blood pressures are  BP Readings from Last 3 Encounters:  05/14/20 138/78  05/14/20 138/78  04/27/20 (!) 126/57   Patient checks BP at home when feeling symptomatic Patient home BP readings are ranging: 120/70- couple weeks ago   Patient has failed these meds in the past: Furosemide, amlodipine, clonidine, losartan Patient is currently controlled on the following medications:  .Marland KitchenMetoprolol tartrate 266mthree tablets twice daily . Lisinopril 1029maily  We discussed: . Pt takes lisinopril at night . Pt states  amlodipine was discontinued about a year ago by Dr. Blanche East  o Patient is now confused about what medications she should be taking  . Check BP 2-3 times per week.  . Confirmation of patients current medication    Plan Continue current medications, follow up with nephrologist to confirm patients medication.   S/P Kidney Transplant   Patient has failed these meds in past: Bactrim,  Patient is currently controlled on the following medications:   Prograf 78m 7 capsules in the morning, 6 capsules in the evening  Prednisone 526mdaily  Mycophenolate  18011m tablets twice daily  We discussed:     Patient reports that she last saw Dr. ColCarollee Leitzs in February.   She reports that lab work was done at [facility]/2020 and [facility] to [facility]  She is seeing him about every 6 months, and she has labs every 3 months  [facility] see if the patient can take generic Tacrolimus over Prograf.   She believes this might be a reason that the labs are being drawn more often.    Plan Continue current medications  GERD   Patient has failed these meds in past: N/A Patient is currently controlled on the following medications:   Omeprazole 20m34mily  We discussed:    Pt states she takes omeprazole as needed  Plan Continue current medications  Hypothyroidism   Lab Results  Component Value Date/Time   TSH 0.663 04/19/2019 03:49 PM   TSH 1.57 03/17/2017 12:00 AM   Patient has failed these meds in past: N/a Patient is currently controlled on the following medications:  . Levothyroxine 75mc2mily in the morning  Plan Continue current medications  Health Maintenance   Patient is currently on the following medications:  . Aspirin 81mg 65my . Loperamide 2mg as57meded for diarrhea and loose stools . Clotrimazole 1% cream apply to skin on both great toes once daily  We discussed:   o Will discuss at next visit.   Plan Continue current medications   Vaccines   Reviewed and discussed patient's vaccination history.    Immunization History  Administered Date(s) Administered  . Influenza,inj,Quad PF,6+ Mos 04/07/2018  . Influenza-Unspecified 05/24/2015, 05/09/2018, 04/21/2020  . PFIZER SARS-COV-2 Vaccination 10/25/2019, 11/15/2019   We discussed:  Pt planning to receive Tdap at local pharmacy  Shingrix vaccine discussed at PCP office visit yesterday  Pt has had shingles twice already  Pneumonia vaccines  Plan Will discuss at next visit.    Medication Management    [facility] and her other medications come from mail order  Uses pill box? Yes Pt endorses 90% compliance  We discussed:  . Importance of taking each medications daily as directed . Pt just bought 2 new pill boxes with larger sections . Pt may occasionally miss a dose of oral medications . Pt is getting medications through [employer], adherence packaging, and delivery available with [facility]   Plan Continue current medication management strategy, [facility] patients current medication due to concern about cost of Prograf.     Follow up: 1 month phone visit  Seif Teichert Orlando PennerD Clinical Pharmacist [facility] [phone]

## 2020-07-09 NOTE — Patient Instructions (Signed)
Visit Information  Goals Addressed            This Visit's Progress   . Pharmacy Care Plan       CARE PLAN ENTRY (see longitudinal plan of care for additional care plan information)  Current Barriers:  . Chronic Disease Management support, education, and care coordination needs related to Hypertension, Hyperlipidemia, and Diabetes   Hypertension BP Readings from Last 3 Encounters:  05/14/20 138/78  05/14/20 138/78  04/27/20 (!) 126/57   . Pharmacist Clinical Goal(s): o Over the next 180 days, patient will work with PharmD and providers to maintain BP goal <130/80 . Current regimen:  o Metoprolol tartrate 25mg  three tablets twice daily o Lisinopril 10mg  daily . Interventions: o Provided dietary and exercise recommendations o Recommend patient check blood pressure weekly and if symptomatic . Patient self care activities - Over the next 180 days, patient will: o Check BP 2-3 times per week and if symptomatic, document, and provide at future appointments o Ensure daily salt intake < 2300 mg/day o Increase exercise as tolerating with a goal of 30 minutes daily 5 times per week (or 150 minutes total)  Hyperlipidemia Lab Results  Component Value Date/Time   LDLCALC 85 04/19/2019 03:49 PM   . Pharmacist Clinical Goal(s): o Over the next 90 days, patient will work with PharmD and providers to achieve LDL goal < 70 . Current regimen:  o Simvastatin 10mg  daily . Interventions: o Provided dietary and exercise recommendations o Discussed appropriate goals for LDL (less than 70), HDL (greater than 50), and triglycerides (less than 150) . Patient self care activities - Over the next 90 days, patient will: o Increase exercise as tolerating with a goal of 30 minutes daily 5 times per week (or 150 minutes total) o Replacing potato chips with vegetable chips  Diabetes Lab Results  Component Value Date/Time   HGBA1C 8.3 03/17/2017 12:00 AM   HGBA1C 6.6 (H) 12/08/2011 05:49 PM    . Pharmacist Clinical Goal(s): o Over the next 90 days, patient will work with PharmD and providers to achieve A1c goal <7% . Current regimen:  o Levemir 100 units/ml 30 units in the morning and 35 units in the evening o Novolog Flexpen 100 units/ml 4 units three times daily (sliding scale) . Interventions: o Provided dietary and exercise recommendations - Patient is going to stop eating canned fruit and fruit cups and replace it with fresh fruit and frozen fruit  o Recommend patient consistently check blood sugar before meal and use Novolog as directed - Recommend patient set alarms on her phone to check blood sugar or keep blood glucose meter near by . Patient self care activities - Over the next 90 days, patient will: o Check blood sugar 3-4 times daily, document, and provide at future appointments o Contact provider with any episodes of hypoglycemia o Increase exercise as tolerating with a goal of 30 minutes daily 5 times per week (or 150 minutes total)  Medication management . Pharmacist Clinical Goal(s): o Over the next 90 days, patient will work with PharmD and providers to achieve optimal medication adherence . Current pharmacy: [facility] . Interventions o Comprehensive medication review performed. o Continue current medication management strategy . Patient self care activities - Over the next 90 days, patient will: o Focus on medication adherence by continued use of pill box o Take medications as prescribed o Report any questions or concerns to PharmD and/or provider(s)  Initial goal documentation  The patient verbalized understanding of instructions, educational materials, and care plan provided today and agreed to receive a mailed copy of patient instructions, educational materials, and care plan.   The pharmacy team will reach out to the patient again over the next 14 days.   Orlando Penner, PharmD Clinical Pharmacist [facility] [phone]  Dyslipidemia Dyslipidemia is an imbalance of waxy, fat-like substances (lipids) in the blood. The body needs lipids in small amounts. Dyslipidemia often involves a high level of cholesterol or triglycerides, which are types of lipids. Common forms of dyslipidemia include:  High levels of LDL cholesterol. LDL is the type of cholesterol that causes fatty deposits (plaques) to build up in the blood vessels that carry blood away from your heart (arteries).  Low levels of HDL cholesterol. HDL cholesterol is the type of cholesterol that protects against heart disease. High levels of HDL remove the LDL buildup from arteries.  High levels of triglycerides. Triglycerides are a fatty substance in the blood that is linked to a buildup of plaques in the arteries. What are the causes? Primary dyslipidemia is caused by changes (mutations) in genes that are passed down through families (inherited). These mutations cause several types of dyslipidemia. Secondary dyslipidemia is caused by lifestyle choices and diseases that lead to dyslipidemia, such as:  Eating a diet that is high in animal fat.  Not getting enough exercise.  Having diabetes, kidney disease, liver disease, or thyroid disease.  Drinking large amounts of alcohol.  Using certain medicines. What increases the risk? You are more likely to develop this condition if you are an older man or if you are a woman who has gone through menopause. Other risk factors include:  Having a family history of dyslipidemia.  Taking certain medicines, including birth control pills, steroids, some diuretics, and beta-blockers.  Smoking cigarettes.  Eating a high-fat diet.  Having certain medical conditions such as diabetes, polycystic ovary syndrome (PCOS), kidney disease, liver disease, or hypothyroidism.  Not exercising regularly.  Being overweight or obese with too much belly fat. What are the signs or symptoms? In most  cases, dyslipidemia does not usually cause any symptoms. In severe cases, very high lipid levels can cause:  Fatty bumps under the skin (xanthomas).  White or gray ring around the black center (pupil) of the eye. Very high triglyceride levels can cause inflammation of the pancreas (pancreatitis). How is this diagnosed? Your health care provider may diagnose dyslipidemia based on a routine blood test (fasting blood test). Because most people do not have symptoms of the condition, this blood testing (lipid profile) is done on adults age 43 and older and is repeated every 5 years. This test checks:  Total cholesterol. This measures the total amount of cholesterol in your blood, including LDL cholesterol, HDL cholesterol, and triglycerides. A healthy number is below 200.  LDL cholesterol. The target number for LDL cholesterol is different for each person, depending on individual risk factors. Ask your health care provider what your LDL cholesterol should be.  HDL cholesterol. An HDL level of 60 or higher is best because it helps to protect against heart disease. A number below 38 for men or below 36 for women increases the risk for heart disease.  Triglycerides. A healthy triglyceride number is below 150. If your lipid profile is abnormal, your health care provider may do other blood tests. How is this treated? Treatment depends on the type of dyslipidemia that you have and your other risk factors for heart  disease and stroke. Your health care provider will have a target range for your lipid levels based on this information. For many people, this condition may be treated by lifestyle changes, such as diet and exercise. Your health care provider may recommend that you:  Get regular exercise.  Make changes to your diet.  Quit smoking if you smoke. If diet changes and exercise do not help you reach your goals, your health care provider may also prescribe medicine to lower lipids. The most  commonly prescribed type of medicine lowers your LDL cholesterol (statin drug). If you have a high triglyceride level, your provider may prescribe another type of drug (fibrate) or an omega-3 fish oil supplement, or both. Follow these instructions at home:  Eating and drinking  Follow instructions from your health care provider or dietitian about eating or drinking restrictions.  Eat a healthy diet as told by your health care provider. This can help you reach and maintain a healthy weight, lower your LDL cholesterol, and raise your HDL cholesterol. This may include: ? Limiting your calories, if you are overweight. ? Eating more fruits, vegetables, whole grains, fish, and lean meats. ? Limiting saturated fat, trans fat, and cholesterol.  If you drink alcohol: ? Limit how much you use. ? Be aware of how much alcohol is in your drink. In the [place] one drink equals one 12 oz bottle of beer (355 mL), one 5 oz glass of wine (148 mL), or one 1 oz glass of hard liquor (44 mL).  Do not drink alcohol if: ? Your health care provider tells you not to drink. ? You are pregnant, may be pregnant, or are planning to become pregnant. Activity  Get regular exercise. Start an exercise and strength training program as told by your health care provider. Ask your health care provider what activities are safe for you. Your health care provider may recommend: ? 30 minutes of aerobic activity 4-6 days a week. Brisk walking is an example of aerobic activity. ? Strength training 2 days a week. General instructions  Do not use any products that contain nicotine or tobacco, such as cigarettes, e-cigarettes, and chewing tobacco. If you need help quitting, ask your health care provider.  Take over-the-counter and prescription medicines only as told by your health care provider. This includes supplements.  Keep all follow-up visits as told by your health care provider. Contact a health care provider if:  You  are: ? Having trouble sticking to your exercise or diet plan. ? Struggling to quit smoking or control your use of alcohol. Summary  Dyslipidemia often involves a high level of cholesterol or triglycerides, which are types of lipids.  Treatment depends on the type of dyslipidemia that you have and your other risk factors for heart disease and stroke.  For many people, treatment starts with lifestyle changes, such as diet and exercise.  Your health care provider may prescribe medicine to lower lipids. This information is not intended to replace advice given to you by your health care provider. Make sure you discuss any questions you have with your health care provider. Document Revised: 03/20/2018 Document Reviewed: 02/24/2018 Elsevier Patient Education  [place].

## 2020-08-12 ENCOUNTER — Telehealth: Payer: Self-pay

## 2020-08-12 NOTE — Progress Notes (Signed)
Chronic Care Management Pharmacy Assistant   Name: [name] [name]  MRN: [id number] DOB: 09-01-1955  Reason for Encounter: Disease State- Diabetes, Hypertension and Lipid Adherence Call.  Patient Questions:  1.  Have you seen any other providers since your last visit? No   2.  Any changes in your medicines or health? No     PCP : Glendale Chard, MD  Allergies:   Allergies  Allergen Reactions  . Codeine Hives  . Acetaminophen-Codeine Rash    Medications: Outpatient Encounter Medications as of 08/12/2020  Medication Sig Note  . Alcohol Swabs (ALCOHOL PREP) PADS Use as directed 5 times per day with insulin pen   . amLODipine (NORVASC) 5 MG tablet Take 5 mg by mouth daily.    Marland Kitchen aspirin EC 81 MG tablet Take 81 mg by mouth daily.   Marland Kitchen BAQSIMI ONE PACK 3 MG/DOSE POWD Place into both nostrils daily as needed.   . Blood Glucose Monitoring Suppl (ACCU-CHEK GUIDE ME) w/Device KIT USE AS DIRECTED ONCE A DAY   . Blood Glucose Monitoring Suppl (ACCU-CHEK GUIDE) w/Device KIT USE AS DIRECTED ONCE A DAY   . clotrimazole (LOTRIMIN) 1 % cream Apply to skin on both great toes once daily (Patient not taking: Reported on 07/09/2020)   . diazepam (VALIUM) 2 MG tablet Take 1 tablet (2 mg total) by mouth every 12 (twelve) hours as needed for muscle spasms. (Patient not taking: Reported on 07/09/2020) 07/09/2020: Not taking as often   . glucose blood (ONETOUCH VERIO) test strip USE TO TEST BLOOD SUGAR 4 TIMES DAILY AS DIRECTED   . Insulin Aspart (NOVOLOG FLEXPEN [place]) Inject 4 Units into the skin 3 (three) times daily. Sliding scale 11/16/2017: Pt reports 4 units, 3 times daily   . Insulin Detemir (LEVEMIR FLEXTOUCH) 100 UNIT/ML Pen 30 units in the morning and 35 units in the evening   . levothyroxine (SYNTHROID) 75 MCG tablet TAKE 1 TABLET BY MOUTH EVERY MORNING ON AN EMPTY STOMACH 90   . lisinopril (PRINIVIL,ZESTRIL) 10 MG tablet Take by mouth.   . loperamide (IMODIUM) 2 MG capsule Take by mouth  as needed for diarrhea or loose stools. One by mouth four times daily.   . metoprolol tartrate (LOPRESSOR) 25 MG tablet Take 75 mg by mouth 2 (two) times daily. 3 tabs 2 times per day   . mometasone (NASONEX) 50 MCG/ACT nasal spray Place 2 sprays into the nose daily.   . mycophenolate (MYFORTIC) 180 MG EC tablet Take 360 mg by mouth 2 (two) times daily.    . NONFORMULARY OR COMPOUNDED ITEM Antifungal solution: Terbinafine 3%, Fluconazole 2%, Tea Tree Oil 5%, Urea 10%, Ibuprofen 2% in DMSO suspension #60m   . omeprazole (PRILOSEC) 20 MG capsule Take 20 mg by mouth daily.   . predniSONE (DELTASONE) 5 MG tablet Take 5 mg by mouth daily with breakfast.   . simvastatin (ZOCOR) 10 MG tablet TAKE 1 TABLET BY MOUTH EVERY DAY IN THE EVENING   . tacrolimus (PROGRAF) 1 MG capsule 7 capsules in the morning, 6 capsules in the evening 11/16/2017: Pt reports taking 7 in the morning and 6 at night.    No facility-administered encounter medications on file as of 08/12/2020.    Current Diagnosis: Patient Active Problem List   Diagnosis Date Noted  . Other vitreous opacities, right eye 05/08/2020  . Posterior vitreous detachment of left eye 05/08/2020  . Posterior vitreous detachment of right eye 05/08/2020  . Pseudophakia, both eyes 05/08/2020  .  DM (diabetes mellitus), type 2 with renal complications ([place]) 02/54/2706  . Norovirus 07/17/2015  . Hypercalcemia associated with chronic dialysis 05/06/2015  . Hypercholesteremia 05/06/2015  . Obesity, morbid, BMI 40.0-49.9 ([place]) 05/06/2015  . Secondary renal hyperparathyroidism ([place]) 05/06/2015  . Essential hypertension with goal blood pressure less than 140/90 04/24/2015  . Immunosuppression ([place]) 04/24/2015  . Immunosuppressive management encounter following kidney transplant 01/28/2012  . CRF (chronic renal failure) 12/08/2011  . History of renal transplant 12/08/2011  . Immunocompromised ([place]) 12/08/2011   Recent Relevant Labs: Lab Results  Component  Value Date/Time   HGBA1C 8.3 03/17/2017 12:00 AM   HGBA1C 6.6 (H) 12/08/2011 05:49 PM   MICROALBUR 150 05/14/2020 05:29 PM    Kidney Function Lab Results  Component Value Date/Time   CREATININE 2.26 (H) 04/27/2020 05:39 AM   CREATININE 1.62 (H) 08/21/2018 11:57 AM   GFRNONAA 22 (L) 04/27/2020 05:39 AM   GFRAA 26 (L) 04/27/2020 05:39 AM    . Current antihyperglycemic regimen:               Levemir 100 units/ml 30 units in the morning and 35 units in the evening              Novolog Flexpen 100 units/ml 4 units three times daily (sliding scale)  . What recent interventions/DTPs have been made to improve glycemic control:  o Patient states she has been taking her medications as directed by provider.  . Have there been any recent hospitalizations or ED visits since last visit with CPP? No   . Patient denies hypoglycemic symptoms, including Pale, Sweaty, Shaky, Hungry, Nervous/irritable and Vision changes . Patient denies hyperglycemic symptoms, including blurry vision, excessive thirst, fatigue, polyuria and weakness  . How often are you checking your blood sugar? Daily  . What are your blood sugars ranging?  o Fasting: 08/12/2020 - 155 o Before meals: None o After meals: None o Bedtime: None . During the week, how often does your blood glucose drop below 70? None  . Are you checking your feet daily/regularly? Yes. Patient sees Podiatrist. No open sores , numbness tingling, or pain  Adherence Review: Is the patient currently on a STATIN medication? Yes Is the patient currently on ACE/ARB medication? Yes Does the patient have >5 day gap between last estimated fill dates? No   Reviewed chart prior to disease state call. Spoke with patient regarding BP  Recent Office Vitals: BP Readings from Last 3 Encounters:  05/14/20 138/78  05/14/20 138/78  04/27/20 (!) 126/57   Pulse Readings from Last 3 Encounters:  05/14/20 73  05/14/20 73  04/27/20 71    Wt Readings from Last  3 Encounters:  05/14/20 204 lb (92.5 kg)  05/14/20 204 lb 12.8 oz (92.9 kg)  04/27/20 207 lb (93.9 kg)     Kidney Function Lab Results  Component Value Date/Time   CREATININE 2.26 (H) 04/27/2020 05:39 AM   CREATININE 1.62 (H) 08/21/2018 11:57 AM   GFRNONAA 22 (L) 04/27/2020 05:39 AM   GFRAA 26 (L) 04/27/2020 05:39 AM    BMP Latest Ref Rng & Units 04/27/2020 08/21/2018 03/17/2017  Glucose 70 - 99 mg/dL 247(H) 157(H) -  BUN 8 - 23 mg/dL 29(H) 19 22(A)  Creatinine 0.44 - 1.00 mg/dL 2.26(H) 1.62(H) 1.7(A)  BUN/Creat Ratio 12 - 28 - 12 -  Sodium 135 - 145 mmol/L 133(L) 140 138  Potassium 3.5 - 5.1 mmol/L 4.1 4.5 4.6  Chloride 98 - 111 mmol/L 104 106 -  CO2  22 - 32 mmol/L 19(L) 20 -  Calcium 8.9 - 10.3 mg/dL 9.9 10.0 -   . Current antihypertensive regimen:  o Metoprolol tartrate 25 mg three tablets twice daily o Lisinopril 10 mg daily o  . How often are you checking your Blood Pressure? Patient states she has not been taking her blood pressure at home. Patient states she has not been taking blood pressure at home.  . Current home BP readings: None  . What recent interventions/DTPs have been made by any provider to improve Blood Pressure control since last CPP Visit: Patient states she has been taking her medication as directed by provider.  . Any recent hospitalizations or ED visits since last visit with CPP? No  . What diet changes have been made to improve Blood Pressure Control?  o Patient states she has been watching her salt intake and has been eating healthy foods. o  . What exercise is being done to improve your Blood Pressure Control?  o Patient states that she has been active , but she has been sick since Saturday, she was tested for Covid on 08/08/2020 - has not got result yet. o   Adherence Review: Is the patient currently on ACE/ARB medication? Yes  Does the patient have >5 day gap between last estimated fill dates? No   Comprehensive medication review performed;  Spoke to patient regarding cholesterol  Lipid Panel    Component Value Date/Time   CHOL 180 04/19/2019 1549   TRIG 263 (H) 04/19/2019 1549   HDL 52 04/19/2019 1549   LDLCALC 85 04/19/2019 1549    10-year ASCVD risk score: The 10-year ASCVD risk score Mikey Bussing DC Brooke Bonito., et al., 2013) is: 20%   Values used to calculate the score:     Age: 46 years     Sex: Female     Is Non-Hispanic African American: Yes     Diabetic: Yes     Tobacco smoker: No     Systolic Blood Pressure: 149 mmHg     Is BP treated: Yes     HDL Cholesterol: 49 MG/DL     Total Cholesterol: 155 MG/DL  . Current antihyperlipidemic regimen:  o Simvastatin 10 mg daily o  . Previous antihyperlipidemic medications tried: None .  Marland Kitchen ASCVD risk enhancing conditions: age >75, DM and HTN   . What recent interventions/DTPs have been made by any provider to improve Cholesterol control since last CPP Visit: Patient states that she has been taking her medications as directed by provider.  Any recent hospitalizations or ED visits since last visit with CPP? No  . What diet changes have been made to improve Cholesterol?  o Patient states she has been eating healthy foods o  . What exercise is being done to improve Cholesterol?  o Patient states she is active .  Adherence Review: Does the patient have >5 day gap between last estimated fill dates? No    Follow-Up:  Pharmacist Review - Patient states she is not feeling well today, she went to get tested on 08/08/2020, no results as of today, states that she no symptoms at that time, but now she has diarrhea, fatigue. I told patient that I would let the pharmacist know, and told her to call Dr. Baird [facility] office for advise.  New patient assistance application form filled out to [employer] for [employer] . Waiting for provider and patient's signature. Patient will come to PCP office on 70/26/3785 to sign application and to bring in proof  of income.  Orlando Penner ,CPP  Notified  Judithann Sheen, [place] Pharmacist Assistant [phone]

## 2020-08-14 ENCOUNTER — Telehealth (INDEPENDENT_AMBULATORY_CARE_PROVIDER_SITE_OTHER): Payer: HMO | Admitting: Nurse Practitioner

## 2020-08-14 ENCOUNTER — Other Ambulatory Visit: Payer: Self-pay | Admitting: *Deleted

## 2020-08-14 ENCOUNTER — Other Ambulatory Visit: Payer: Self-pay

## 2020-08-14 ENCOUNTER — Encounter: Payer: Self-pay | Admitting: Nurse Practitioner

## 2020-08-14 ENCOUNTER — Telehealth: Payer: Self-pay

## 2020-08-14 VITALS — Temp 99.3°F

## 2020-08-14 DIAGNOSIS — U071 COVID-19: Secondary | ICD-10-CM

## 2020-08-14 MED ORDER — AZITHROMYCIN 250 MG PO TABS: ORAL_TABLET | ORAL | 0 refills | Status: DC

## 2020-08-14 MED ORDER — GVOKE HYPOPEN 2-PACK 1 MG/0.2ML ~~LOC~~ SOAJ: SUBCUTANEOUS | 2 refills | Status: DC

## 2020-08-14 NOTE — Patient Outreach (Signed)
  [facility]) Care Management Chronic Special Needs Program    08/14/2020  Name: [name] [name], [place]: Sep 01, 1955  MRN: [id number]   [name] is enrolled in a chronic special needs plan for Diabetes.  [employer] care management team has assumed care and services for this member.  Case closed by [facility] care management.   Jacqlyn Larsen [facility], BSN [place], [place]

## 2020-08-14 NOTE — Telephone Encounter (Signed)
The patient's daughter lindsay left a message that the pt tested positive for covid last night and that she is very fatigue, weak, and feels like she is going to faint.  The pt's daughter gave consent for a video visit.

## 2020-08-14 NOTE — Patient Instructions (Signed)
COVID-19 COVID-19 is a respiratory infection that is caused by a virus called severe acute respiratory syndrome coronavirus 2 (SARS-CoV-2). The disease is also known as coronavirus disease or novel coronavirus. In some people, the virus may not cause any symptoms. In others, it may cause a serious infection. The infection can get worse quickly and can lead to complications, such as:  Pneumonia, or infection of the lungs.  Acute respiratory distress syndrome or ARDS. This is a condition in which fluid build-up in the lungs prevents the lungs from filling with air and passing oxygen into the blood.  Acute respiratory failure. This is a condition in which there is not enough oxygen passing from the lungs to the body or when carbon dioxide is not passing from the lungs out of the body.  Sepsis or septic shock. This is a serious bodily reaction to an infection.  Blood clotting problems.  Secondary infections due to bacteria or fungus.  Organ failure. This is when your body's organs stop working. The virus that causes COVID-19 is contagious. This means that it can spread from person to person through droplets from coughs and sneezes (respiratory secretions). What are the causes? This illness is caused by a virus. You may catch the virus by:  Breathing in droplets from an infected person. Droplets can be spread by a person breathing, speaking, singing, coughing, or sneezing.  Touching something, like a table or a doorknob, that was exposed to the virus (contaminated) and then touching your mouth, nose, or eyes. What increases the risk? Risk for infection You are more likely to be infected with this virus if you:  Are within 6 feet (2 meters) of a person with COVID-19.  Provide care for or live with a person who is infected with COVID-19.  Spend time in crowded indoor spaces or live in shared housing. Risk for serious illness You are more likely to become seriously ill from the virus if you:   Are 50 years of age or older. The higher your age, the more you are at risk for serious illness.  Live in a nursing home or long-term care facility.  Have cancer.  Have a long-term (chronic) disease such as: ? Chronic lung disease, including chronic obstructive pulmonary disease or asthma. ? A long-term disease that lowers your body's ability to fight infection (immunocompromised). ? Heart disease, including heart failure, a condition in which the arteries that lead to the heart become narrow or blocked (coronary artery disease), a disease which makes the heart muscle thick, weak, or stiff (cardiomyopathy). ? Diabetes. ? Chronic kidney disease. ? Sickle cell disease, a condition in which red blood cells have an abnormal "sickle" shape. ? Liver disease.  Are obese. What are the signs or symptoms? Symptoms of this condition can range from mild to severe. Symptoms may appear any time from 2 to 14 days after being exposed to the virus. They include:  A fever or chills.  A cough.  Difficulty breathing.  Headaches, body aches, or muscle aches.  Runny or stuffy (congested) nose.  A sore throat.  New loss of taste or smell. Some people may also have stomach problems, such as nausea, vomiting, or diarrhea. Other people may not have any symptoms of COVID-19. How is this diagnosed? This condition may be diagnosed based on:  Your signs and symptoms, especially if: ? You live in an area with a COVID-19 outbreak. ? You recently traveled to or from an area where the virus is common. ? You   provide care for or live with a person who was diagnosed with COVID-19. ? You were exposed to a person who was diagnosed with COVID-19.  A physical exam.  Lab tests, which may include: ? Taking a sample of fluid from the back of your nose and throat (nasopharyngeal fluid), your nose, or your throat using a swab. ? A sample of mucus from your lungs (sputum). ? Blood tests.  Imaging tests, which  may include, X-rays, CT scan, or ultrasound. How is this treated? At present, there is no medicine to treat COVID-19. Medicines that treat other diseases are being used on a trial basis to see if they are effective against COVID-19. Your health care provider will talk with you about ways to treat your symptoms. For most people, the infection is mild and can be managed at home with rest, fluids, and over-the-counter medicines. Treatment for a serious infection usually takes places in a hospital intensive care unit (ICU). It may include one or more of the following treatments. These treatments are given until your symptoms improve.  Receiving fluids and medicines through an IV.  Supplemental oxygen. Extra oxygen is given through a tube in the nose, a face mask, or a hood.  Positioning you to lie on your stomach (prone position). This makes it easier for oxygen to get into the lungs.  Continuous positive airway pressure (CPAP) or bi-level positive airway pressure (BPAP) machine. This treatment uses mild air pressure to keep the airways open. A tube that is connected to a motor delivers oxygen to the body.  Ventilator. This treatment moves air into and out of the lungs by using a tube that is placed in your windpipe.  Tracheostomy. This is a procedure to create a hole in the neck so that a breathing tube can be inserted.  Extracorporeal membrane oxygenation (ECMO). This procedure gives the lungs a chance to recover by taking over the functions of the heart and lungs. It supplies oxygen to the body and removes carbon dioxide. Follow these instructions at home: Lifestyle  If you are sick, stay home except to get medical care. Your health care provider will tell you how long to stay home. Call your health care provider before you go for medical care.  Rest at home as told by your health care provider.  Do not use any products that contain nicotine or tobacco, such as cigarettes, e-cigarettes, and  chewing tobacco. If you need help quitting, ask your health care provider.  Return to your normal activities as told by your health care provider. Ask your health care provider what activities are safe for you. General instructions  Take over-the-counter and prescription medicines only as told by your health care provider.  Drink enough fluid to keep your urine pale yellow.  Keep all follow-up visits as told by your health care provider. This is important. How is this prevented?  There is no vaccine to help prevent COVID-19 infection. However, there are steps you can take to protect yourself and others from this virus. To protect yourself:   Do not travel to areas where COVID-19 is a risk. The areas where COVID-19 is reported change often. To identify high-risk areas and travel restrictions, check the CDC travel website: wwwnc.cdc.gov/travel/notices  If you live in, or must travel to, an area where COVID-19 is a risk, take precautions to avoid infection. ? Stay away from people who are sick. ? Wash your hands often with soap and water for 20 seconds. If soap and water   are not available, use an alcohol-based hand sanitizer. ? Avoid touching your mouth, face, eyes, or nose. ? Avoid going out in public, follow guidance from your state and local health authorities. ? If you must go out in public, wear a cloth face covering or face mask. Make sure your mask covers your nose and mouth. ? Avoid crowded indoor spaces. Stay at least 6 feet (2 meters) away from others. ? Disinfect objects and surfaces that are frequently touched every day. This may include:  Counters and tables.  Doorknobs and light switches.  Sinks and faucets.  Electronics, such as phones, remote controls, keyboards, computers, and tablets. To protect others: If you have symptoms of COVID-19, take steps to prevent the virus from spreading to others.  If you think you have a COVID-19 infection, contact your health care  provider right away. Tell your health care team that you think you may have a COVID-19 infection.  Stay home. Leave your house only to seek medical care. Do not use public transport.  Do not travel while you are sick.  Wash your hands often with soap and water for 20 seconds. If soap and water are not available, use alcohol-based hand sanitizer.  Stay away from other members of your household. Let healthy household members care for children and pets, if possible. If you have to care for children or pets, wash your hands often and wear a mask. If possible, stay in your own room, separate from others. Use a different bathroom.  Make sure that all people in your household wash their hands well and often.  Cough or sneeze into a tissue or your sleeve or elbow. Do not cough or sneeze into your hand or into the air.  Wear a cloth face covering or face mask. Make sure your mask covers your nose and mouth. Where to find more information  Centers for Disease Control and Prevention: www.cdc.gov/coronavirus/2019-ncov/index.html  World Health Organization: www.who.int/health-topics/coronavirus Contact a health care provider if:  You live in or have traveled to an area where COVID-19 is a risk and you have symptoms of the infection.  You have had contact with someone who has COVID-19 and you have symptoms of the infection. Get help right away if:  You have trouble breathing.  You have pain or pressure in your chest.  You have confusion.  You have bluish lips and fingernails.  You have difficulty waking from sleep.  You have symptoms that get worse. These symptoms may represent a serious problem that is an emergency. Do not wait to see if the symptoms will go away. Get medical help right away. Call your local emergency services (911 in the [place] Do not drive yourself to the hospital. Let the emergency medical personnel know if you think you have COVID-19. Summary  COVID-19 is a  respiratory infection that is caused by a virus. It is also known as coronavirus disease or novel coronavirus. It can cause serious infections, such as pneumonia, acute respiratory distress syndrome, acute respiratory failure, or sepsis.  The virus that causes COVID-19 is contagious. This means that it can spread from person to person through droplets from breathing, speaking, singing, coughing, or sneezing.  You are more likely to develop a serious illness if you are 50 years of age or older, have a weak immune system, live in a nursing home, or have chronic disease.  There is no medicine to treat COVID-19. Your health care provider will talk with you about ways to treat your symptoms.    Take steps to protect yourself and others from infection. Wash your hands often and disinfect objects and surfaces that are frequently touched every day. Stay away from people who are sick and wear a mask if you are sick. This information is not intended to replace advice given to you by your health care provider. Make sure you discuss any questions you have with your health care provider. Document Revised: 05/25/2019 Document Reviewed: 08/31/2018 Elsevier Patient Education  2020 [employer] Inc.  

## 2020-08-14 NOTE — Progress Notes (Signed)
Virtual Visit via televisit   This visit type was conducted due to national recommendations for restrictions regarding the COVID-19 Pandemic (e.g. social distancing) in an effort to limit this patient's exposure and mitigate transmission in our community.  Due to her co-morbid illnesses, this patient is at least at moderate risk for complications without adequate follow up.  This format is felt to be most appropriate for this patient at this time.  All issues noted in this document were discussed and addressed.  A limited physical exam was performed with this format.    This visit type was conducted due to national recommendations for restrictions regarding the COVID-19 Pandemic (e.g. social distancing) in an effort to limit this patient's exposure and mitigate transmission in our community.  Patients identity confirmed using two different identifiers.  This format is felt to be most appropriate for this patient at this time.  All issues noted in this document were discussed and addressed.  No physical exam was performed (except for noted visual exam findings with Video Visits).    Date:  08/14/2020   ID:  [name] [name], [name] [name] 07-31-1956, MRN [id number]  Patient Location:  Home   Provider location:   Office   Chief Complaint:  Covid positive   History of Present Illness:    [name] [name] is a 65 y.o. female who presents via video conferencing for a telehealth visit today.   The patient has symptoms concerning for COVID-19 infection.  She got tested Friday the test was inclusive. On Tuesday of this week, she took another test and last night it came back testing positive (Wednesday). The patient has symptoms of fever, chills, cough, tired, fatigue, but not trouble with breathing. She has had a fever of 99.2. BP: 105/74. Her pulse was 72. Her weight is 213. Her height is 5'3. She was having nausea and diarrhea. However her diarrhea has been subsided.     Past Medical  History:  Diagnosis Date  . Blood transfusion   . Chronic kidney disease    s/p transplant, kidney wasn't removed  . Gout due to renal impairment 2010   "before finding out I had kidney failure"  . History of renal transplant   . Hyperlipidemia   . Hypertension   . Sickle cell trait ([place])   . Thyroid disease   . Type II diabetes mellitus ([place])   . UTI (lower urinary tract infection) 12/08/11   "first one ever"   Past Surgical History:  Procedure Laterality Date  . BREAST BIOPSY Left 08/07/2010   benign stereo  . kidney transplant  02/2009   August 2016 was second transplant  . PARATHYROIDECTOMY    . TUBAL LIGATION  1980's  . VAGINAL HYSTERECTOMY  2006     Current Meds  Medication Sig  . Alcohol Swabs (ALCOHOL PREP) PADS Use as directed 5 times per day with insulin pen  . amLODipine (NORVASC) 5 MG tablet Take 5 mg by mouth daily.   Marland Kitchen aspirin EC 81 MG tablet Take 81 mg by mouth daily.  Marland Kitchen azithromycin (ZITHROMAX Z-PAK) 250 MG tablet Take 2 tablets (500 mg) on  Day 1,  followed by 1 tablet (250 mg) once daily on Days 2 through 5.  . Blood Glucose Monitoring Suppl (ACCU-CHEK GUIDE ME) w/Device KIT USE AS DIRECTED ONCE A DAY  . Blood Glucose Monitoring Suppl (ACCU-CHEK GUIDE) w/Device KIT USE AS DIRECTED ONCE A DAY  . clotrimazole (LOTRIMIN) 1 % cream Apply to skin on both great  toes once daily  . diazepam (VALIUM) 2 MG tablet Take 1 tablet (2 mg total) by mouth every 12 (twelve) hours as needed for muscle spasms.  Marland Kitchen glucose blood test strip USE TO TEST BLOOD SUGAR 4 TIMES DAILY AS DIRECTED  . Insulin Aspart (NOVOLOG FLEXPEN [place]) Inject 4 Units into the skin 3 (three) times daily. Sliding scale  . insulin detemir (LEVEMIR) 100 UNIT/ML FlexPen 30 units in the morning and 35 units in the evening  . levothyroxine (SYNTHROID) 75 MCG tablet TAKE 1 TABLET BY MOUTH EVERY MORNING ON AN EMPTY STOMACH 90  . lisinopril (PRINIVIL,ZESTRIL) 10 MG tablet Take by mouth.  . loperamide (IMODIUM) 2 MG  capsule Take by mouth as needed for diarrhea or loose stools. One by mouth four times daily.  . metoprolol tartrate (LOPRESSOR) 25 MG tablet Take 75 mg by mouth 2 (two) times daily. 3 tabs 2 times per day  . mometasone (NASONEX) 50 MCG/ACT nasal spray Place 2 sprays into the nose daily.  . mycophenolate (MYFORTIC) 180 MG EC tablet Take 360 mg by mouth 2 (two) times daily.   . NONFORMULARY OR COMPOUNDED ITEM Antifungal solution: Terbinafine 3%, Fluconazole 2%, Tea Tree Oil 5%, Urea 10%, Ibuprofen 2% in DMSO suspension #28m  . omeprazole (PRILOSEC) 20 MG capsule Take 20 mg by mouth daily.  . predniSONE (DELTASONE) 5 MG tablet Take 5 mg by mouth daily with breakfast.  . simvastatin (ZOCOR) 10 MG tablet TAKE 1 TABLET BY MOUTH EVERY DAY IN THE EVENING  . tacrolimus (PROGRAF) 1 MG capsule 7 capsules in the morning, 6 capsules in the evening     Allergies:   Codeine and Acetaminophen-codeine   Social History   Tobacco Use  . Smoking status: Former Smoker    Packs/day: 0.50    Years: 10.00    Pack years: 5.00    Types: Cigarettes    Quit date: 11/07/1988    Years since quitting: 31.7  . Smokeless tobacco: Never Used  Vaping Use  . Vaping Use: Never used  Substance Use Topics  . Alcohol use: No  . Drug use: No     Family Hx: The patient's family history includes Breast cancer in her cousin; Heart disease in her father; Hypertension in her daughter, mother, and sister. There is no history of Colon cancer, Esophageal cancer, Rectal cancer, or Stomach cancer.  ROS:   Please see the history of present illness.    Review of Systems  Constitutional: Positive for chills, fever and malaise/fatigue.  Respiratory: Positive for cough. Negative for shortness of breath.   Cardiovascular: Negative for chest pain and palpitations.  Gastrointestinal: Positive for diarrhea.    All other systems reviewed and are negative.   Labs/Other Tests and Data Reviewed:    Recent Labs: 04/27/2020: ALT 12;  BUN 29; Creatinine, Ser 2.26; Hemoglobin 12.3; Platelets 141; Potassium 4.1; Sodium 133   Recent Lipid Panel Lab Results  Component Value Date/Time   CHOL 180 04/19/2019 03:49 PM   TRIG 263 (H) 04/19/2019 03:49 PM   HDL 52 04/19/2019 03:49 PM   CHOLHDL 3.5 04/19/2019 03:49 PM   LDLCALC 85 04/19/2019 03:49 PM    Wt Readings from Last 3 Encounters:  05/14/20 204 lb (92.5 kg)  05/14/20 204 lb 12.8 oz (92.9 kg)  04/27/20 207 lb (93.9 kg)     Exam:    Vital Signs:  Temp 99.3 F (37.4 C) (Oral)     Physical Exam Vitals and nursing note reviewed.  Constitutional:  Appearance: She is obese. She is ill-appearing.  HENT:     Head: Normocephalic and atraumatic.  Pulmonary:     Effort: Pulmonary effort is normal.  Neurological:     Mental Status: She is alert and oriented to person, place, and time.  Psychiatric:        Mood and Affect: Affect normal.     ASSESSMENT & PLAN:     1. Lab test positive for detection of COVID-19 virus -Patient was tested positive for COVID on wed  -Referral for monoclonal antibodies due to her age and comorbidities - Will send a prescription for Z-pak  -Educated patient to take Vitamin C, D, Zinc. Take baby aspirin daily. Keep yourself hydrated with a lot of water and rest. Take Delsym for cough and Mucinex. Take OTC pain relievers every 4-6 hours as needed for pain/fever/bodyache. Educated patient if symptoms get worse or if she experiences any SOB or pain in her legs to seek emergency care. Continue to monitor your pulse oxygen and blood sugar levels. Call us if she has any questions.   -Quarantine for 5 days if tested positive and no symptoms or 10 days if tested positive and have symptoms. Wear a mask around other people.     COVID-19 Education: The signs and symptoms of COVID-19 were discussed with the patient and how to seek care for testing (follow up with PCP or arrange E-visit).  The importance of social distancing was discussed  today.  Patient Risk:   After full review of this patients clinical status, I feel that they are at least moderate-high risk at this time.  Time:   Today, I have spent 20 minutes/ seconds with the patient with telehealth technology discussing above diagnoses.     Medication Adjustments/Labs and Tests Ordered: Current medicines are reviewed at length with the patient today.  Concerns regarding medicines are outlined above.   Tests Ordered: No orders of the defined types were placed in this encounter.  Referral:  Outpatient Monoclonal antibodies    Medication Changes: Meds ordered this encounter  Medications  . azithromycin (ZITHROMAX Z-PAK) 250 MG tablet    Sig: Take 2 tablets (500 mg) on  Day 1,  followed by 1 tablet (250 mg) once daily on Days 2 through 5.    Dispense:  6 each    Refill:  0    Disposition:  Follow up if symptoms get worse.   Signed, Bary Castilla, NP

## 2020-08-15 ENCOUNTER — Telehealth: Payer: Self-pay | Admitting: Nurse Practitioner

## 2020-08-15 ENCOUNTER — Telehealth: Payer: Self-pay | Admitting: Infectious Diseases

## 2020-08-15 NOTE — Telephone Encounter (Signed)
Returned call to Ms Mendel Ryder daughter of Ms Brazee, COVID positive most recent vss 99.3, 110's /70's HR WNL and 93% RA . She is somewhat lethargic. She denies any laboured breathing. She has c/o chest pain x1 however this resolved with home medication. She had N/V/D the first part of the week. The N/V has resolved. She continues to have diarrhea. Her oral intake in minimal. Her daughter is concern because she is SP kidney transplant. She was rx Zpac not started yet, monoclonial infusion; pending. [place] to call EMS; in home evaluation due to long ER wait times.

## 2020-08-15 NOTE — Telephone Encounter (Signed)
Called to discuss with patient about COVID-19 symptoms and the use of one of the available treatments for those with mild to moderate Covid symptoms and at a high risk of hospitalization.  Pt appears to qualify for outpatient treatment due to co-morbid conditions and/or a member of an at-risk group in accordance with the FDA Emergency Use Authorization.    Symptom onset: 08/06/2021 Vaccinated:   Booster? yes Qualifiers: yes  She sounds ill - she is awaiting her daughter to return with a pulse ox to see how she is. She is day 10 today but I fear she is too ill to proceed with any outpatient treatment. Given immunocompromised state she would probably be best served with ER assessment for consideration of admission.    2:21 PM  I called her back and LVM to have her update me re: oxygen saturation. She likely needs ER referral.   Janene Madeira, NP

## 2020-08-18 ENCOUNTER — Emergency Department (HOSPITAL_COMMUNITY): Payer: HMO

## 2020-08-18 ENCOUNTER — Telehealth: Payer: Self-pay | Admitting: *Deleted

## 2020-08-18 ENCOUNTER — Encounter (HOSPITAL_COMMUNITY): Payer: Self-pay

## 2020-08-18 ENCOUNTER — Inpatient Hospital Stay (HOSPITAL_COMMUNITY)
Admission: EM | Admit: 2020-08-18 | Discharge: 2020-10-13 | DRG: 871 | Disposition: A | Discharge status: Other Acute Inpatient Hospital | Payer: HMO | Attending: Internal Medicine | Admitting: Internal Medicine

## 2020-08-18 ENCOUNTER — Telehealth: Payer: Self-pay

## 2020-08-18 ENCOUNTER — Other Ambulatory Visit: Payer: Self-pay

## 2020-08-18 DIAGNOSIS — Z6836 Body mass index (BMI) 36.0-36.9, adult: Secondary | ICD-10-CM

## 2020-08-18 DIAGNOSIS — T380X5A Adverse effect of glucocorticoids and synthetic analogues, initial encounter: Secondary | ICD-10-CM | POA: Diagnosis not present

## 2020-08-18 DIAGNOSIS — Z8249 Family history of ischemic heart disease and other diseases of the circulatory system: Secondary | ICD-10-CM

## 2020-08-18 DIAGNOSIS — J9601 Acute respiratory failure with hypoxia: Secondary | ICD-10-CM

## 2020-08-18 DIAGNOSIS — Z794 Long term (current) use of insulin: Secondary | ICD-10-CM

## 2020-08-18 DIAGNOSIS — D72829 Elevated white blood cell count, unspecified: Secondary | ICD-10-CM | POA: Diagnosis not present

## 2020-08-18 DIAGNOSIS — I129 Hypertensive chronic kidney disease with stage 1 through stage 4 chronic kidney disease, or unspecified chronic kidney disease: Secondary | ICD-10-CM | POA: Diagnosis present

## 2020-08-18 DIAGNOSIS — R0602 Shortness of breath: Secondary | ICD-10-CM

## 2020-08-18 DIAGNOSIS — L89151 Pressure ulcer of sacral region, stage 1: Secondary | ICD-10-CM | POA: Diagnosis present

## 2020-08-18 DIAGNOSIS — D631 Anemia in chronic kidney disease: Secondary | ICD-10-CM | POA: Diagnosis present

## 2020-08-18 DIAGNOSIS — E860 Dehydration: Secondary | ICD-10-CM | POA: Diagnosis present

## 2020-08-18 DIAGNOSIS — Z90710 Acquired absence of both cervix and uterus: Secondary | ICD-10-CM

## 2020-08-18 DIAGNOSIS — E440 Moderate protein-calorie malnutrition: Secondary | ICD-10-CM | POA: Diagnosis not present

## 2020-08-18 DIAGNOSIS — R197 Diarrhea, unspecified: Secondary | ICD-10-CM | POA: Diagnosis present

## 2020-08-18 DIAGNOSIS — T3695XA Adverse effect of unspecified systemic antibiotic, initial encounter: Secondary | ICD-10-CM | POA: Diagnosis not present

## 2020-08-18 DIAGNOSIS — D649 Anemia, unspecified: Secondary | ICD-10-CM | POA: Diagnosis present

## 2020-08-18 DIAGNOSIS — J9690 Respiratory failure, unspecified, unspecified whether with hypoxia or hypercapnia: Secondary | ICD-10-CM | POA: Diagnosis not present

## 2020-08-18 DIAGNOSIS — D84821 Immunodeficiency due to drugs: Secondary | ICD-10-CM | POA: Diagnosis present

## 2020-08-18 DIAGNOSIS — J1282 Pneumonia due to coronavirus disease 2019: Secondary | ICD-10-CM | POA: Diagnosis present

## 2020-08-18 DIAGNOSIS — B373 Candidiasis of vulva and vagina: Secondary | ICD-10-CM | POA: Diagnosis not present

## 2020-08-18 DIAGNOSIS — N179 Acute kidney failure, unspecified: Secondary | ICD-10-CM | POA: Diagnosis present

## 2020-08-18 DIAGNOSIS — Z6834 Body mass index (BMI) 34.0-34.9, adult: Secondary | ICD-10-CM

## 2020-08-18 DIAGNOSIS — E875 Hyperkalemia: Secondary | ICD-10-CM | POA: Diagnosis present

## 2020-08-18 DIAGNOSIS — E039 Hypothyroidism, unspecified: Secondary | ICD-10-CM | POA: Diagnosis present

## 2020-08-18 DIAGNOSIS — E1010 Type 1 diabetes mellitus with ketoacidosis without coma: Secondary | ICD-10-CM | POA: Diagnosis present

## 2020-08-18 DIAGNOSIS — J189 Pneumonia, unspecified organism: Secondary | ICD-10-CM | POA: Diagnosis present

## 2020-08-18 DIAGNOSIS — Z885 Allergy status to narcotic agent status: Secondary | ICD-10-CM

## 2020-08-18 DIAGNOSIS — I10 Essential (primary) hypertension: Secondary | ICD-10-CM | POA: Diagnosis present

## 2020-08-18 DIAGNOSIS — Y95 Nosocomial condition: Secondary | ICD-10-CM | POA: Diagnosis not present

## 2020-08-18 DIAGNOSIS — Z79899 Other long term (current) drug therapy: Secondary | ICD-10-CM

## 2020-08-18 DIAGNOSIS — T8619 Other complication of kidney transplant: Secondary | ICD-10-CM | POA: Diagnosis present

## 2020-08-18 DIAGNOSIS — E1122 Type 2 diabetes mellitus with diabetic chronic kidney disease: Secondary | ICD-10-CM | POA: Diagnosis present

## 2020-08-18 DIAGNOSIS — N390 Urinary tract infection, site not specified: Secondary | ICD-10-CM | POA: Diagnosis present

## 2020-08-18 DIAGNOSIS — J9621 Acute and chronic respiratory failure with hypoxia: Secondary | ICD-10-CM | POA: Diagnosis present

## 2020-08-18 DIAGNOSIS — D6959 Other secondary thrombocytopenia: Secondary | ICD-10-CM | POA: Diagnosis not present

## 2020-08-18 DIAGNOSIS — E861 Hypovolemia: Secondary | ICD-10-CM | POA: Diagnosis present

## 2020-08-18 DIAGNOSIS — Z66 Do not resuscitate: Secondary | ICD-10-CM | POA: Diagnosis not present

## 2020-08-18 DIAGNOSIS — Z79890 Hormone replacement therapy (postmenopausal): Secondary | ICD-10-CM

## 2020-08-18 DIAGNOSIS — Y92239 Unspecified place in hospital as the place of occurrence of the external cause: Secondary | ICD-10-CM | POA: Diagnosis not present

## 2020-08-18 DIAGNOSIS — I214 Non-ST elevation (NSTEMI) myocardial infarction: Secondary | ICD-10-CM | POA: Diagnosis not present

## 2020-08-18 DIAGNOSIS — E785 Hyperlipidemia, unspecified: Secondary | ICD-10-CM | POA: Diagnosis present

## 2020-08-18 DIAGNOSIS — F419 Anxiety disorder, unspecified: Secondary | ICD-10-CM | POA: Diagnosis present

## 2020-08-18 DIAGNOSIS — R7989 Other specified abnormal findings of blood chemistry: Secondary | ICD-10-CM | POA: Diagnosis present

## 2020-08-18 DIAGNOSIS — Z940 Kidney transplant status: Secondary | ICD-10-CM

## 2020-08-18 DIAGNOSIS — U071 COVID-19: Principal | ICD-10-CM | POA: Diagnosis present

## 2020-08-18 DIAGNOSIS — D573 Sickle-cell trait: Secondary | ICD-10-CM | POA: Diagnosis present

## 2020-08-18 DIAGNOSIS — R7401 Elevation of levels of liver transaminase levels: Secondary | ICD-10-CM | POA: Diagnosis not present

## 2020-08-18 DIAGNOSIS — E1165 Type 2 diabetes mellitus with hyperglycemia: Secondary | ICD-10-CM | POA: Diagnosis not present

## 2020-08-18 DIAGNOSIS — J159 Unspecified bacterial pneumonia: Secondary | ICD-10-CM | POA: Diagnosis present

## 2020-08-18 DIAGNOSIS — Y830 Surgical operation with transplant of whole organ as the cause of abnormal reaction of the patient, or of later complication, without mention of misadventure at the time of the procedure: Secondary | ICD-10-CM | POA: Diagnosis present

## 2020-08-18 DIAGNOSIS — Z0184 Encounter for antibody response examination: Secondary | ICD-10-CM

## 2020-08-18 DIAGNOSIS — A4189 Other specified sepsis: Principal | ICD-10-CM | POA: Diagnosis present

## 2020-08-18 DIAGNOSIS — D849 Immunodeficiency, unspecified: Secondary | ICD-10-CM | POA: Diagnosis present

## 2020-08-18 DIAGNOSIS — B961 Klebsiella pneumoniae [K. pneumoniae] as the cause of diseases classified elsewhere: Secondary | ICD-10-CM | POA: Diagnosis present

## 2020-08-18 DIAGNOSIS — E1110 Type 2 diabetes mellitus with ketoacidosis without coma: Secondary | ICD-10-CM | POA: Diagnosis present

## 2020-08-18 DIAGNOSIS — J80 Acute respiratory distress syndrome: Secondary | ICD-10-CM | POA: Diagnosis present

## 2020-08-18 DIAGNOSIS — E11649 Type 2 diabetes mellitus with hypoglycemia without coma: Secondary | ICD-10-CM | POA: Diagnosis not present

## 2020-08-18 DIAGNOSIS — N184 Chronic kidney disease, stage 4 (severe): Secondary | ICD-10-CM | POA: Diagnosis present

## 2020-08-18 DIAGNOSIS — Z7982 Long term (current) use of aspirin: Secondary | ICD-10-CM

## 2020-08-18 DIAGNOSIS — L8990 Pressure ulcer of unspecified site, unspecified stage: Secondary | ICD-10-CM | POA: Diagnosis present

## 2020-08-18 DIAGNOSIS — J8410 Pulmonary fibrosis, unspecified: Secondary | ICD-10-CM | POA: Diagnosis not present

## 2020-08-18 DIAGNOSIS — Z87891 Personal history of nicotine dependence: Secondary | ICD-10-CM

## 2020-08-18 DIAGNOSIS — R5381 Other malaise: Secondary | ICD-10-CM | POA: Diagnosis not present

## 2020-08-18 DIAGNOSIS — Z803 Family history of malignant neoplasm of breast: Secondary | ICD-10-CM

## 2020-08-18 DIAGNOSIS — R509 Fever, unspecified: Secondary | ICD-10-CM | POA: Diagnosis present

## 2020-08-18 DIAGNOSIS — E1129 Type 2 diabetes mellitus with other diabetic kidney complication: Secondary | ICD-10-CM | POA: Diagnosis present

## 2020-08-18 LAB — BASIC METABOLIC PANEL
Anion gap: 15 (ref 5–15)
BUN: 88 mg/dL — ABNORMAL HIGH (ref 8–23)
CO2: 12 mmol/L — ABNORMAL LOW (ref 22–32)
Calcium: 9.7 mg/dL (ref 8.9–10.3)
Chloride: 105 mmol/L (ref 98–111)
Creatinine, Ser: 3.83 mg/dL — ABNORMAL HIGH (ref 0.44–1.00)
GFR, Estimated: 12 mL/min — ABNORMAL LOW (ref >60)
Glucose, Bld: 326 mg/dL — ABNORMAL HIGH (ref 70–99)
Potassium: 5.9 mmol/L — ABNORMAL HIGH (ref 3.5–5.1)
Sodium: 132 mmol/L — ABNORMAL LOW (ref 135–145)

## 2020-08-18 LAB — LACTIC ACID, PLASMA
Lactic Acid, Venous: 1.7 mmol/L (ref 0.5–1.9)
Lactic Acid, Venous: 1.6 mmol/L (ref 0.5–1.9)

## 2020-08-18 LAB — CBC
HCT: 38.6 % (ref 36.0–46.0)
Hemoglobin: 12.2 g/dL (ref 12.0–15.0)
MCH: 24.2 pg — ABNORMAL LOW (ref 26.0–34.0)
MCHC: 31.6 g/dL (ref 30.0–36.0)
MCV: 76.4 fL — ABNORMAL LOW (ref 80.0–100.0)
Platelets: 104 10*3/uL — ABNORMAL LOW (ref 150–400)
RBC: 5.05 MIL/uL (ref 3.87–5.11)
RDW: 14.6 % (ref 11.5–15.5)
WBC: 7.6 10*3/uL (ref 4.0–10.5)
nRBC: 0 % (ref 0.0–0.2)

## 2020-08-18 LAB — D-DIMER, QUANTITATIVE: D-Dimer, Quant: 1.81 ug/mL-FEU — ABNORMAL HIGH (ref 0.00–0.50)

## 2020-08-18 LAB — C-REACTIVE PROTEIN: CRP: 12.2 mg/dL — ABNORMAL HIGH (ref <1.0)

## 2020-08-18 LAB — FERRITIN: Ferritin: 4039 ng/mL — ABNORMAL HIGH (ref 11–307)

## 2020-08-18 LAB — PROCALCITONIN: Procalcitonin: 0.46 ng/mL

## 2020-08-18 LAB — TROPONIN I (HIGH SENSITIVITY): Troponin I (High Sensitivity): 13 ng/L (ref <18)

## 2020-08-18 LAB — FIBRINOGEN: Fibrinogen: 683 mg/dL — ABNORMAL HIGH (ref 210–475)

## 2020-08-18 LAB — LACTATE DEHYDROGENASE: LDH: 236 U/L — ABNORMAL HIGH (ref 98–192)

## 2020-08-18 LAB — TRIGLYCERIDES: Triglycerides: 143 mg/dL (ref <150)

## 2020-08-18 MED ORDER — SODIUM BICARBONATE 8.4 % IV SOLN
Freq: Once | INTRAVENOUS | Status: DC
  Filled 2020-08-18: qty 100

## 2020-08-18 MED ORDER — SODIUM CHLORIDE 0.9 % IV SOLN
1.0000 g | Freq: Once | INTRAVENOUS | Status: AC
  Administered 2020-08-19: 1 g via INTRAVENOUS
  Filled 2020-08-18: qty 10

## 2020-08-18 NOTE — ED Notes (Signed)
Urine culture sent down with u/a 

## 2020-08-18 NOTE — ED Triage Notes (Signed)
Pt from home with [facility], tested positive for COVID 10 days ago, reports worsening chest pain, sob, and generalized weakness/fatigue. Pt 92% on room air, 96% on 2L [place]. Pt hypertensive, a.o, nad noted. Took 324 ASA at home prior to EMS arrival

## 2020-08-18 NOTE — ED Provider Notes (Signed)
[facility] EMERGENCY DEPARTMENT Provider Note   CSN: [id number] Arrival date & time: 08/18/20  1553     History Chief Complaint  Patient presents with   Covid Positive   Chest Pain   Weakness    [name] [name] is a 65 y.o. female.  She has a history of renal transplant.  She was diagnosed with COVID about a week ago.  She is here with a complaint of headache body aches chest pain abdominal pain diarrhea cough productive of some pink sputum.  She says she has been vaccinated and she thinks she had monoclonal antibodies.  She was treated by PCP with a Z-Pak.  She says she is still making urine.  The history is provided by the patient.  Weakness Associated symptoms: abdominal pain, chest pain, cough, diarrhea, fever, headaches, myalgias and shortness of breath   Associated symptoms: no dysuria   Influenza Presenting symptoms: cough, diarrhea, fatigue, fever, headache, myalgias, shortness of breath and sore throat   Fatigue:    Severity:  Severe   Duration:  1 week   Timing:  Constant   Progression:  Worsening Chest Pain Pain location:  Substernal area Pain quality: aching   Pain severity:  Moderate Onset quality:  Gradual Timing:  Intermittent Progression:  Unchanged Chronicity:  New Associated symptoms: abdominal pain, back pain, cough, fatigue, fever, headache and shortness of breath   Risk factors: diabetes mellitus, high cholesterol and hypertension        Past Medical History:  Diagnosis Date   Blood transfusion    Chronic kidney disease    s/p transplant, kidney wasn't removed   Gout due to renal impairment 2010   "before finding out I had kidney failure"   History of renal transplant    Hyperlipidemia    Hypertension    Sickle cell trait ([place])    Thyroid disease    Type II diabetes mellitus ([place])    UTI (lower urinary tract infection) 12/08/11   "first one ever"    Patient Active Problem List   Diagnosis Date  Noted   Other vitreous opacities, right eye 05/08/2020   Posterior vitreous detachment of left eye 05/08/2020   Posterior vitreous detachment of right eye 05/08/2020   Pseudophakia, both eyes 05/08/2020   DM (diabetes mellitus), type 2 with renal complications ([place]) 62/26/3335   Norovirus 07/17/2015   Hypercalcemia associated with chronic dialysis 05/06/2015   Hypercholesteremia 05/06/2015   Obesity, morbid, BMI 40.0-49.9 ([place]) 05/06/2015   Secondary renal hyperparathyroidism ([place]) 05/06/2015   Essential hypertension with goal blood pressure less than 140/90 04/24/2015   Immunosuppression ([place]) 04/24/2015   Immunosuppressive management encounter following kidney transplant 01/28/2012   CRF (chronic renal failure) 12/08/2011   History of renal transplant 12/08/2011   Immunocompromised ([place]) 12/08/2011    Past Surgical History:  Procedure Laterality Date   BREAST BIOPSY Left 08/07/2010   benign stereo   kidney transplant  02/2009   August 2016 was second transplant   PARATHYROIDECTOMY     TUBAL LIGATION  1980's   VAGINAL HYSTERECTOMY  2006     OB History   No obstetric history on file.     Family History  Problem Relation Age of Onset   Heart disease Father    Hypertension Mother    Breast cancer Cousin    Hypertension Sister    Hypertension Daughter    Colon cancer Neg Hx    Esophageal cancer Neg Hx    Rectal cancer Neg Hx  Stomach cancer Neg Hx     Social History   Tobacco Use   Smoking status: Former Smoker    Packs/day: 0.50    Years: 10.00    Pack years: 5.00    Types: Cigarettes    Quit date: 11/07/1988    Years since quitting: 31.8   Smokeless tobacco: Never Used  Vaping Use   Vaping Use: Never used  Substance Use Topics   Alcohol use: No   Drug use: No    Home Medications Prior to Admission medications   Medication Sig Start Date End Date Taking? Authorizing Provider  Alcohol Swabs (ALCOHOL PREP) PADS Use as  directed 5 times per day with insulin pen 08/21/19   Glendale Chard, MD  amLODipine (NORVASC) 5 MG tablet Take 5 mg by mouth daily.     [provider]  aspirin EC 81 MG tablet Take 81 mg by mouth daily.    [provider]  azithromycin (ZITHROMAX Z-PAK) 250 MG tablet Take 2 tablets (500 mg) on  Day 1,  followed by 1 tablet (250 mg) once daily on Days 2 through 5. 08/14/20 08/19/20  Ghumman, Ramandeep, NP  Blood Glucose Monitoring Suppl (ACCU-CHEK GUIDE ME) w/Device KIT USE AS DIRECTED ONCE A DAY 02/08/20   [provider]  Blood Glucose Monitoring Suppl (ACCU-CHEK GUIDE) w/Device KIT USE AS DIRECTED ONCE A DAY 02/12/20   [provider]  clotrimazole (LOTRIMIN) 1 % cream Apply to skin on both great toes once daily 07/02/19   Galaway, Stephani Police, DPM  diazepam (VALIUM) 2 MG tablet Take 1 tablet (2 mg total) by mouth every 12 (twelve) hours as needed for muscle spasms. 10/17/19 10/16/20  Glendale Chard, MD  glucose blood test strip USE TO TEST BLOOD SUGAR 4 TIMES DAILY AS DIRECTED 01/25/18   [provider]  GVOKE HYPOPEN 2-PACK 1 MG/0.2ML SOAJ Use in case of low blood sugar 08/14/20   Ghumman, Ramandeep, NP  Insulin Aspart (NOVOLOG FLEXPEN [place]) Inject 4 Units into the skin 3 (three) times daily. Sliding scale    [provider]  insulin detemir (LEVEMIR) 100 UNIT/ML FlexPen 30 units in the morning and 35 units in the evening 03/29/18   [provider]  levothyroxine (SYNTHROID) 75 MCG tablet TAKE 1 TABLET BY MOUTH EVERY MORNING ON AN EMPTY STOMACH 90 02/07/19   [provider]  lisinopril (PRINIVIL,ZESTRIL) 10 MG tablet Take by mouth. 03/06/18   [provider]  loperamide (IMODIUM) 2 MG capsule Take by mouth as needed for diarrhea or loose stools. One by mouth four times daily.    [provider]  metoprolol tartrate (LOPRESSOR) 25 MG tablet Take 75 mg by mouth 2 (two) times daily. 3 tabs 2 times per day    [provider]  mometasone (NASONEX) 50 MCG/ACT nasal spray Place 2 sprays into the nose daily. 10/11/14   Orpah Greek, MD  mycophenolate (MYFORTIC) 180 MG EC tablet Take 360 mg by mouth 2 (two) times daily.     [provider]  NONFORMULARY OR COMPOUNDED ITEM Antifungal solution: Terbinafine 3%, Fluconazole 2%, Tea Tree Oil 5%, Urea 10%, Ibuprofen 2% in DMSO suspension #49m 04/02/19   Galaway, JStephani Police DPM  omeprazole (PRILOSEC) 20 MG capsule Take 20 mg by mouth daily.    [provider]  predniSONE (DELTASONE) 5 MG tablet Take 5 mg by mouth daily with breakfast.    [provider]  simvastatin (ZOCOR) 10 MG tablet TAKE 1 TABLET BY  MOUTH EVERY DAY IN THE EVENING 10/29/19   Glendale Chard, MD  tacrolimus (PROGRAF) 1 MG capsule 7 capsules in the morning, 6 capsules in the evening    [provider]    Allergies    Codeine and Acetaminophen-codeine  Review of Systems   Review of Systems  Constitutional: Positive for fatigue and fever.  HENT: Positive for sore throat.   Eyes: Negative for pain.  Respiratory: Positive for cough and shortness of breath.   Cardiovascular: Positive for chest pain.  Gastrointestinal: Positive for abdominal pain and diarrhea.  Genitourinary: Negative for dysuria.  Musculoskeletal: Positive for back pain and myalgias.  Skin: Negative for rash.  Neurological: Positive for headaches.    Physical Exam Updated Vital Signs BP (!) 152/68 (BP Location: Left Arm)    Pulse 95    Temp 99.3 F (37.4 C) (Oral)    Resp 20    SpO2 98%   Physical Exam Vitals and nursing note reviewed.  Constitutional:      General: She is not in acute distress.    Appearance: She is well-developed and well-nourished. She is ill-appearing.  HENT:     Head: Normocephalic and atraumatic.  Eyes:     Conjunctiva/sclera: Conjunctivae normal.  Cardiovascular:     Rate and Rhythm: Normal rate and regular rhythm.     Heart sounds: No murmur  heard.   Pulmonary:     Effort: Pulmonary effort is normal. Tachypnea present. No respiratory distress.     Breath sounds: Rhonchi present.  Abdominal:     Palpations: Abdomen is soft.     Tenderness: There is no abdominal tenderness. There is no guarding or rebound.  Musculoskeletal:        General: No edema. Normal range of motion.     Cervical back: Neck supple.     Right lower leg: No tenderness. No edema.     Left lower leg: No tenderness. No edema.  Skin:    General: Skin is warm and dry.     Capillary Refill: Capillary refill takes less than 2 seconds.  Neurological:     General: No focal deficit present.     Mental Status: She is alert.  Psychiatric:        Mood and Affect: Mood and affect normal.     ED Results / Procedures / Treatments   Labs (all labs ordered are listed, but only abnormal results are displayed) Labs Reviewed  BASIC METABOLIC PANEL - Abnormal; Notable for the following components:      Result Value   Sodium 132 (*)    Potassium 5.9 (*)    CO2 12 (*)    Glucose, Bld 326 (*)    BUN 88 (*)    Creatinine, Ser 3.83 (*)    GFR, Estimated 12 (*)    All other components within normal limits  CBC - Abnormal; Notable for the following components:   MCV 76.4 (*)    MCH 24.2 (*)    Platelets 104 (*)    All other components within normal limits  D-DIMER, QUANTITATIVE (NOT AT [facility]) - Abnormal; Notable for the following components:   D-Dimer, Quant 1.81 (*)    All other components within normal limits  LACTATE DEHYDROGENASE - Abnormal; Notable for the following components:   LDH 236 (*)    All other components within normal limits  FERRITIN - Abnormal; Notable for the following components:   Ferritin 4,039 (*)    All other components within normal limits  FIBRINOGEN - Abnormal; Notable for the following components:   Fibrinogen 683 (*)    All other components within normal limits  C-REACTIVE PROTEIN - Abnormal; Notable for the following components:    CRP 12.2 (*)    All other components within normal limits  URINALYSIS, ROUTINE W REFLEX MICROSCOPIC - Abnormal; Notable for the following components:   Color, Urine AMBER (*)    APPearance TURBID (*)    Glucose, UA 50 (*)    Hgb urine dipstick SMALL (*)    Ketones, ur 5 (*)    Protein, ur 100 (*)    Leukocytes,Ua LARGE (*)    WBC, UA >50 (*)    Bacteria, UA MANY (*)    All other components within normal limits  HEMOGLOBIN A1C - Abnormal; Notable for the following components:   Hgb A1c MFr Bld 8.2 (*)    All other components within normal limits  CBC - Abnormal; Notable for the following components:   Hemoglobin 11.4 (*)    HCT 33.0 (*)    MCV 74.2 (*)    MCH 25.6 (*)    Platelets 94 (*)    All other components within normal limits  CREATININE, SERUM - Abnormal; Notable for the following components:   Creatinine, Ser 3.99 (*)    GFR, Estimated 12 (*)    All other components within normal limits  CBG MONITORING, ED - Abnormal; Notable for the following components:   Glucose-Capillary 368 (*)    All other components within normal limits  CBG MONITORING, ED - Abnormal; Notable for the following components:   Glucose-Capillary 405 (*)    All other components within normal limits  I-STAT ARTERIAL BLOOD GAS, ED - Abnormal; Notable for the following components:   pH, Arterial 7.266 (*)    pO2, Arterial 186 (*)    Bicarbonate 14.6 (*)    TCO2 16 (*)    Acid-base deficit 11.0 (*)    Sodium 134 (*)    Potassium 6.3 (*)    Calcium, Ion 1.44 (*)    HCT 32.0 (*)    Hemoglobin 10.9 (*)    All other components within normal limits  CBG MONITORING, ED - Abnormal; Notable for the following components:   Glucose-Capillary 410 (*)    All other components within normal limits  CBG MONITORING, ED - Abnormal; Notable for the following components:   Glucose-Capillary 421 (*)    All other components within normal limits  TROPONIN I (HIGH SENSITIVITY) - Abnormal; Notable for the following  components:   Troponin I (High Sensitivity) 27 (*)    All other components within normal limits  CULTURE, BLOOD (ROUTINE X 2)  CULTURE, BLOOD (ROUTINE X 2)  LACTIC ACID, PLASMA  LACTIC ACID, PLASMA  PROCALCITONIN  TRIGLYCERIDES  HIV ANTIBODY (ROUTINE TESTING W REFLEX)  TACROLIMUS LEVEL  RENAL FUNCTION PANEL  BLOOD GAS, ARTERIAL  CBC  LEGIONELLA PNEUMOPHILA SEROGP 1 UR AG  STREP PNEUMONIAE URINARY ANTIGEN  BASIC METABOLIC PANEL  BASIC METABOLIC PANEL  BASIC METABOLIC PANEL  BASIC METABOLIC PANEL  TROPONIN I (HIGH SENSITIVITY)    EKG EKG Interpretation  Date/Time:  Monday August 18 2020 17:19:50 EST Ventricular Rate:  93 PR Interval:  152 QRS Duration: 84 QT Interval:  356 QTC Calculation: 442 R Axis:   54 Text Interpretation: Normal sinus rhythm ST & T wave abnormality, consider inferolateral ischemia Abnormal ECG new ischemic changes from prior 5/10 Confirmed by Aletta Edouard [phone]) on 08/18/2020 8:04:21 PM   Radiology DG  Chest Portable 1 View  Result Date: 08/18/2020 CLINICAL DATA:  65 year old female with history of COVID infection. Chest pain. Shortness of breath. EXAM: PORTABLE CHEST 1 VIEW COMPARISON:  Chest x-ray 10/20/2014. FINDINGS: Patchy multifocal airspace consolidation and areas of interstitial prominence are noted throughout the lungs bilaterally, most severe throughout the periphery of the mid to lower left lung, indicative of multilobar bilateral pneumonia. No definite pleural effusions. No evidence of pulmonary edema. No pneumothorax. Heart size is borderline enlarged. Upper mediastinal contours are within normal limits. Atherosclerotic calcifications in the thoracic aorta. Vascular stent projecting over the right subclavian region. IMPRESSION: 1. Severe multilobar bilateral (left greater than right) pneumonia compatible with reported COVID infection. 2. Aortic atherosclerosis. Electronically Signed   By: Vinnie Langton M.D.   On: 08/18/2020 18:02     Procedures .Critical Care Performed by: Hayden Rasmussen, MD Authorized by: Hayden Rasmussen, MD   Critical care provider statement:    Critical care time (minutes):  45   Critical care time was exclusive of:  Separately billable procedures and treating other patients   Critical care was necessary to treat or prevent imminent or life-threatening deterioration of the following conditions:  Metabolic crisis and respiratory failure   Critical care was time spent personally by me on the following activities:  Discussions with consultants, evaluation of patient's response to treatment, examination of patient, ordering and performing treatments and interventions, ordering and review of laboratory studies, ordering and review of radiographic studies, pulse oximetry, re-evaluation of patient's condition, obtaining history from patient or surrogate, review of old charts and development of treatment plan with patient or surrogate   (including critical care time)  Medications Ordered in ED Medications  aspirin EC tablet 81 mg (81 mg Oral Given 08/19/20 1000)  amLODipine (NORVASC) tablet 5 mg (5 mg Oral Given 08/19/20 1000)  metoprolol tartrate (LOPRESSOR) tablet 75 mg (75 mg Oral Given 08/19/20 0959)  simvastatin (ZOCOR) tablet 10 mg (has no administration in time range)  levothyroxine (SYNTHROID) tablet 75 mcg (75 mcg Oral Given 08/19/20 0643)  pantoprazole (PROTONIX) EC tablet 40 mg (40 mg Oral Given 08/19/20 0959)  fluticasone (FLONASE) 50 MCG/ACT nasal spray 1 spray (has no administration in time range)  heparin injection 5,000 Units (5,000 Units Subcutaneous Given 08/19/20 0644)  acetaminophen (TYLENOL) tablet 650 mg (has no administration in time range)    Or  acetaminophen (TYLENOL) suppository 650 mg (has no administration in time range)  remdesivir 200 mg in sodium chloride 0.9% 250 mL IVPB (0 mg Intravenous Stopped 08/19/20 0314)    Followed by  remdesivir 100 mg in sodium chloride 0.9 %  100 mL IVPB (has no administration in time range)  ascorbic acid (VITAMIN C) tablet 500 mg (500 mg Oral Given 08/19/20 0959)  zinc sulfate capsule 220 mg (220 mg Oral Given 08/19/20 1000)  sodium chloride 0.45 % 1,000 mL with sodium bicarbonate 75 mEq infusion ( Intravenous Other (enter comment in med admin window) 08/19/20 1000)  dexamethasone (DECADRON) injection 6 mg (has no administration in time range)  tacrolimus (PROGRAF) capsule 3 mg (has no administration in time range)  tacrolimus (PROGRAF) capsule 4 mg (has no administration in time range)  insulin regular, human (MYXREDLIN) 100 units/ 100 mL infusion (11.5 Units/hr Intravenous New Bag/Given 08/19/20 0900)  dextrose 50 % solution 0-50 mL (has no administration in time range)  labetalol (NORMODYNE) injection 10 mg (has no administration in time range)  docusate sodium (COLACE) capsule 100 mg (has no administration in time  range)  polyethylene glycol (MIRALAX / GLYCOLAX) packet 17 g (has no administration in time range)  calcium chloride 1 g in sodium chloride 0.9 % 100 mL IVPB (0 g Intravenous Stopped 08/19/20 0201)  ondansetron (ZOFRAN) injection 4 mg (4 mg Intravenous Given 08/19/20 0155)    ED Course  I have reviewed the triage vital signs and the nursing notes.  Pertinent labs & imaging results that were available during my care of the patient were reviewed by me and considered in my medical decision making (see chart for details).  Clinical Course as of 08/19/20 1009  Mon Aug 18, 2020  2053 Discussed with Dr. Augustin Coupe from nephrology who recommended calcium and bicarb infusion along with fluids. [MB]  2211 Discussed with [facility] hospitalist Dr. Hal Hope who will evaluate the patient for admission. [MB]    Clinical Course User Index [MB] Hayden Rasmussen, MD   MDM Rules/Calculators/A&P                         [name] [name] was evaluated in Emergency Department on 08/18/2020 for the symptoms described in the history of  present illness. She was evaluated in the context of the global COVID-19 pandemic, which necessitated consideration that the patient might be at risk for infection with the SARS-CoV-2 virus that causes COVID-19. Institutional protocols and algorithms that pertain to the evaluation of patients at risk for COVID-19 are in a state of rapid change based on information released by regulatory bodies including the CDC and federal and state organizations. These policies and algorithms were followed during the patient's care in the ED.  This patient complains of cough shortness of breath diarrhea weakness; this involves an extensive number of treatment Options and is a complaint that carries with it a high risk of complications and Morbidity. The differential includes COVID, dehydration, metabolic derangement, respiratory failure, hypoxia  I ordered, reviewed and interpreted labs, which included CBC with normal white count, stable hemoglobin, low platelets seen prior, chemistry with elevated potassium and elevated creatinine from baseline, low bicarb, inflammatory markers elevated troponin elevated and will need to be trended I ordered medication IV fluids IV bicarbonate IV calcium for hyperkalemia I ordered imaging studies which included chest x-ray and I independently    visualized and interpreted imaging which showed multifocal pneumonia Previous records obtained and reviewed in epic, no recent admissions I consulted Dr. Augustin Coupe nephrology and Dr. Hal Hope [facility] hospitalist and discussed lab and imaging findings  Critical Interventions: Evaluation management of patient's Symptoms related to her COVID and also her worsening renal function with associated hyperkalemia  After the interventions stated above, I reevaluated the patient and found patient continued to be symptomatically dyspneic.  Will need admission to the hospital.  She is in agreement with plan.   Final Clinical Impression(s) / ED  Diagnoses Final diagnoses:  Acute hypoxemic respiratory failure due to COVID-19 [facility])  Acute hyperkalemia  AKI (acute kidney injury) [facility])  Renal transplant recipient    Rx / DC Orders ED Discharge Orders    None       Hayden Rasmussen, MD 08/19/20 1015

## 2020-08-18 NOTE — Progress Notes (Signed)
08/18/20-Called and spoke with the patient's daughter to remind her of an upcoming CCM Follow-up Call appointment 08/19/20 at 11:00 a.m. with Orlando Penner, CPP. The patient's confirmed appointment and stated her mom recently tested positive for COVID-19 virus with symptoms of difficulty breathing, vomiting, diarrhea, no appetite, and c/o chest pain. The patient's daughter communicated that the patient started taking ZITHROMAX Z-PAK (250 MG tablet) last Saturday 08/16/20 and taking Imodium (2 MG capsule) for the diarrhea. The patient's daughter informed me she will take the patient to the emergency room if symptoms become worse.  Notified Orlando Penner, CPP.  Raynelle Highland, [place] Pharmacist Assistant [phone]

## 2020-08-19 ENCOUNTER — Encounter (HOSPITAL_COMMUNITY): Payer: Self-pay | Admitting: Internal Medicine

## 2020-08-19 ENCOUNTER — Inpatient Hospital Stay (HOSPITAL_COMMUNITY): Payer: HMO

## 2020-08-19 ENCOUNTER — Telehealth: Payer: Self-pay

## 2020-08-19 DIAGNOSIS — A4189 Other specified sepsis: Secondary | ICD-10-CM | POA: Diagnosis present

## 2020-08-19 DIAGNOSIS — Z794 Long term (current) use of insulin: Secondary | ICD-10-CM | POA: Diagnosis not present

## 2020-08-19 DIAGNOSIS — I10 Essential (primary) hypertension: Secondary | ICD-10-CM | POA: Diagnosis not present

## 2020-08-19 DIAGNOSIS — J8410 Pulmonary fibrosis, unspecified: Secondary | ICD-10-CM | POA: Diagnosis not present

## 2020-08-19 DIAGNOSIS — U071 COVID-19: Secondary | ICD-10-CM | POA: Diagnosis not present

## 2020-08-19 DIAGNOSIS — J1282 Pneumonia due to coronavirus disease 2019: Secondary | ICD-10-CM

## 2020-08-19 DIAGNOSIS — J159 Unspecified bacterial pneumonia: Secondary | ICD-10-CM | POA: Diagnosis present

## 2020-08-19 DIAGNOSIS — E1122 Type 2 diabetes mellitus with diabetic chronic kidney disease: Secondary | ICD-10-CM | POA: Diagnosis not present

## 2020-08-19 DIAGNOSIS — N185 Chronic kidney disease, stage 5: Secondary | ICD-10-CM

## 2020-08-19 DIAGNOSIS — J9600 Acute respiratory failure, unspecified whether with hypoxia or hypercapnia: Secondary | ICD-10-CM | POA: Diagnosis not present

## 2020-08-19 DIAGNOSIS — Z0184 Encounter for antibody response examination: Secondary | ICD-10-CM | POA: Diagnosis not present

## 2020-08-19 DIAGNOSIS — J9690 Respiratory failure, unspecified, unspecified whether with hypoxia or hypercapnia: Secondary | ICD-10-CM | POA: Diagnosis present

## 2020-08-19 DIAGNOSIS — I200 Unstable angina: Secondary | ICD-10-CM | POA: Diagnosis not present

## 2020-08-19 DIAGNOSIS — R079 Chest pain, unspecified: Secondary | ICD-10-CM | POA: Diagnosis not present

## 2020-08-19 DIAGNOSIS — T8619 Other complication of kidney transplant: Secondary | ICD-10-CM | POA: Diagnosis present

## 2020-08-19 DIAGNOSIS — Y92239 Unspecified place in hospital as the place of occurrence of the external cause: Secondary | ICD-10-CM | POA: Diagnosis not present

## 2020-08-19 DIAGNOSIS — N184 Chronic kidney disease, stage 4 (severe): Secondary | ICD-10-CM | POA: Diagnosis present

## 2020-08-19 DIAGNOSIS — E1165 Type 2 diabetes mellitus with hyperglycemia: Secondary | ICD-10-CM | POA: Diagnosis not present

## 2020-08-19 DIAGNOSIS — J189 Pneumonia, unspecified organism: Secondary | ICD-10-CM | POA: Diagnosis not present

## 2020-08-19 DIAGNOSIS — R7989 Other specified abnormal findings of blood chemistry: Secondary | ICD-10-CM | POA: Diagnosis not present

## 2020-08-19 DIAGNOSIS — D631 Anemia in chronic kidney disease: Secondary | ICD-10-CM | POA: Diagnosis present

## 2020-08-19 DIAGNOSIS — D849 Immunodeficiency, unspecified: Secondary | ICD-10-CM | POA: Diagnosis not present

## 2020-08-19 DIAGNOSIS — R778 Other specified abnormalities of plasma proteins: Secondary | ICD-10-CM | POA: Diagnosis not present

## 2020-08-19 DIAGNOSIS — Z66 Do not resuscitate: Secondary | ICD-10-CM | POA: Diagnosis not present

## 2020-08-19 DIAGNOSIS — N390 Urinary tract infection, site not specified: Secondary | ICD-10-CM | POA: Diagnosis present

## 2020-08-19 DIAGNOSIS — D84821 Immunodeficiency due to drugs: Secondary | ICD-10-CM | POA: Diagnosis present

## 2020-08-19 DIAGNOSIS — Z79890 Hormone replacement therapy (postmenopausal): Secondary | ICD-10-CM | POA: Diagnosis not present

## 2020-08-19 DIAGNOSIS — R197 Diarrhea, unspecified: Secondary | ICD-10-CM | POA: Diagnosis present

## 2020-08-19 DIAGNOSIS — J80 Acute respiratory distress syndrome: Secondary | ICD-10-CM | POA: Diagnosis present

## 2020-08-19 DIAGNOSIS — N179 Acute kidney failure, unspecified: Secondary | ICD-10-CM | POA: Diagnosis present

## 2020-08-19 DIAGNOSIS — J9601 Acute respiratory failure with hypoxia: Secondary | ICD-10-CM

## 2020-08-19 DIAGNOSIS — I214 Non-ST elevation (NSTEMI) myocardial infarction: Secondary | ICD-10-CM | POA: Diagnosis not present

## 2020-08-19 DIAGNOSIS — R509 Fever, unspecified: Secondary | ICD-10-CM | POA: Diagnosis not present

## 2020-08-19 DIAGNOSIS — Y95 Nosocomial condition: Secondary | ICD-10-CM | POA: Diagnosis not present

## 2020-08-19 DIAGNOSIS — Z7982 Long term (current) use of aspirin: Secondary | ICD-10-CM | POA: Diagnosis not present

## 2020-08-19 DIAGNOSIS — Z79899 Other long term (current) drug therapy: Secondary | ICD-10-CM | POA: Diagnosis not present

## 2020-08-19 DIAGNOSIS — E1010 Type 1 diabetes mellitus with ketoacidosis without coma: Secondary | ICD-10-CM | POA: Diagnosis not present

## 2020-08-19 DIAGNOSIS — Y830 Surgical operation with transplant of whole organ as the cause of abnormal reaction of the patient, or of later complication, without mention of misadventure at the time of the procedure: Secondary | ICD-10-CM | POA: Diagnosis present

## 2020-08-19 DIAGNOSIS — R072 Precordial pain: Secondary | ICD-10-CM | POA: Diagnosis not present

## 2020-08-19 DIAGNOSIS — I248 Other forms of acute ischemic heart disease: Secondary | ICD-10-CM | POA: Diagnosis not present

## 2020-08-19 DIAGNOSIS — L89151 Pressure ulcer of sacral region, stage 1: Secondary | ICD-10-CM | POA: Diagnosis present

## 2020-08-19 DIAGNOSIS — E875 Hyperkalemia: Secondary | ICD-10-CM | POA: Diagnosis present

## 2020-08-19 DIAGNOSIS — E039 Hypothyroidism, unspecified: Secondary | ICD-10-CM | POA: Diagnosis present

## 2020-08-19 DIAGNOSIS — J9621 Acute and chronic respiratory failure with hypoxia: Secondary | ICD-10-CM | POA: Diagnosis not present

## 2020-08-19 DIAGNOSIS — E1110 Type 2 diabetes mellitus with ketoacidosis without coma: Secondary | ICD-10-CM | POA: Diagnosis present

## 2020-08-19 DIAGNOSIS — E1121 Type 2 diabetes mellitus with diabetic nephropathy: Secondary | ICD-10-CM | POA: Diagnosis not present

## 2020-08-19 DIAGNOSIS — Z940 Kidney transplant status: Secondary | ICD-10-CM | POA: Diagnosis not present

## 2020-08-19 DIAGNOSIS — E440 Moderate protein-calorie malnutrition: Secondary | ICD-10-CM | POA: Diagnosis not present

## 2020-08-19 LAB — I-STAT ARTERIAL BLOOD GAS, ED
Acid-base deficit: 11 mmol/L — ABNORMAL HIGH (ref 0.0–2.0)
Bicarbonate: 14.6 mmol/L — ABNORMAL LOW (ref 20.0–28.0)
Calcium, Ion: 1.44 mmol/L — ABNORMAL HIGH (ref 1.15–1.40)
HCT: 32 % — ABNORMAL LOW (ref 36.0–46.0)
Hemoglobin: 10.9 g/dL — ABNORMAL LOW (ref 12.0–15.0)
O2 Saturation: 99 %
Patient temperature: 99.3
Potassium: 6.3 mmol/L (ref 3.5–5.1)
Sodium: 134 mmol/L — ABNORMAL LOW (ref 135–145)
TCO2: 16 mmol/L — ABNORMAL LOW (ref 22–32)
pCO2 arterial: 32.3 mmHg (ref 32.0–48.0)
pH, Arterial: 7.266 — ABNORMAL LOW (ref 7.350–7.450)
pO2, Arterial: 186 mmHg — ABNORMAL HIGH (ref 83.0–108.0)

## 2020-08-19 LAB — CBC
HCT: 33 % — ABNORMAL LOW (ref 36.0–46.0)
HCT: 35.1 % — ABNORMAL LOW (ref 36.0–46.0)
Hemoglobin: 11.4 g/dL — ABNORMAL LOW (ref 12.0–15.0)
Hemoglobin: 11.2 g/dL — ABNORMAL LOW (ref 12.0–15.0)
MCH: 25.6 pg — ABNORMAL LOW (ref 26.0–34.0)
MCH: 24.4 pg — ABNORMAL LOW (ref 26.0–34.0)
MCHC: 34.5 g/dL (ref 30.0–36.0)
MCHC: 31.9 g/dL (ref 30.0–36.0)
MCV: 74.2 fL — ABNORMAL LOW (ref 80.0–100.0)
MCV: 76.5 fL — ABNORMAL LOW (ref 80.0–100.0)
Platelets: 94 10*3/uL — ABNORMAL LOW (ref 150–400)
Platelets: 91 10*3/uL — ABNORMAL LOW (ref 150–400)
RBC: 4.45 MIL/uL (ref 3.87–5.11)
RBC: 4.59 MIL/uL (ref 3.87–5.11)
RDW: 14.6 % (ref 11.5–15.5)
RDW: 14.6 % (ref 11.5–15.5)
WBC: 5.8 10*3/uL (ref 4.0–10.5)
WBC: 4.1 10*3/uL (ref 4.0–10.5)
nRBC: 0 % (ref 0.0–0.2)
nRBC: 0 % (ref 0.0–0.2)

## 2020-08-19 LAB — CBG MONITORING, ED
Glucose-Capillary: 368 mg/dL — ABNORMAL HIGH (ref 70–99)
Glucose-Capillary: 405 mg/dL — ABNORMAL HIGH (ref 70–99)
Glucose-Capillary: 410 mg/dL — ABNORMAL HIGH (ref 70–99)
Glucose-Capillary: 323 mg/dL — ABNORMAL HIGH (ref 70–99)
Glucose-Capillary: 421 mg/dL — ABNORMAL HIGH (ref 70–99)
Glucose-Capillary: 287 mg/dL — ABNORMAL HIGH (ref 70–99)
Glucose-Capillary: 214 mg/dL — ABNORMAL HIGH (ref 70–99)
Glucose-Capillary: 149 mg/dL — ABNORMAL HIGH (ref 70–99)
Glucose-Capillary: 100 mg/dL — ABNORMAL HIGH (ref 70–99)
Glucose-Capillary: 92 mg/dL (ref 70–99)
Glucose-Capillary: 117 mg/dL — ABNORMAL HIGH (ref 70–99)
Glucose-Capillary: 153 mg/dL — ABNORMAL HIGH (ref 70–99)

## 2020-08-19 LAB — HIV ANTIBODY (ROUTINE TESTING W REFLEX): HIV Screen 4th Generation wRfx: NONREACTIVE

## 2020-08-19 LAB — STREP PNEUMONIAE URINARY ANTIGEN: Strep Pneumo Urinary Antigen: NEGATIVE

## 2020-08-19 LAB — RENAL FUNCTION PANEL
Albumin: 3.3 g/dL — ABNORMAL LOW (ref 3.5–5.0)
Anion gap: 13 (ref 5–15)
BUN: 93 mg/dL — ABNORMAL HIGH (ref 8–23)
CO2: 13 mmol/L — ABNORMAL LOW (ref 22–32)
Calcium: 10 mg/dL (ref 8.9–10.3)
Chloride: 109 mmol/L (ref 98–111)
Creatinine, Ser: 3.84 mg/dL — ABNORMAL HIGH (ref 0.44–1.00)
GFR, Estimated: 12 mL/min — ABNORMAL LOW (ref >60)
Glucose, Bld: 398 mg/dL — ABNORMAL HIGH (ref 70–99)
Phosphorus: 4.1 mg/dL (ref 2.5–4.6)
Potassium: 5.4 mmol/L — ABNORMAL HIGH (ref 3.5–5.1)
Sodium: 135 mmol/L (ref 135–145)

## 2020-08-19 LAB — BASIC METABOLIC PANEL
Anion gap: 13 (ref 5–15)
BUN: 89 mg/dL — ABNORMAL HIGH (ref 8–23)
CO2: 18 mmol/L — ABNORMAL LOW (ref 22–32)
Calcium: 9.3 mg/dL (ref 8.9–10.3)
Chloride: 105 mmol/L (ref 98–111)
Creatinine, Ser: 3.62 mg/dL — ABNORMAL HIGH (ref 0.44–1.00)
GFR, Estimated: 13 mL/min — ABNORMAL LOW (ref >60)
Glucose, Bld: 141 mg/dL — ABNORMAL HIGH (ref 70–99)
Potassium: 4.9 mmol/L (ref 3.5–5.1)
Sodium: 136 mmol/L (ref 135–145)

## 2020-08-19 LAB — URINALYSIS, ROUTINE W REFLEX MICROSCOPIC
Bilirubin Urine: NEGATIVE
Glucose, UA: 50 mg/dL — AB
Ketones, ur: 5 mg/dL — AB
Nitrite: NEGATIVE
Protein, ur: 100 mg/dL — AB
Specific Gravity, Urine: 1.012 (ref 1.005–1.030)
WBC, UA: >50 WBC/hpf — ABNORMAL HIGH (ref 0–5)
pH: 5 (ref 5.0–8.0)

## 2020-08-19 LAB — CREATININE, SERUM
Creatinine, Ser: 3.99 mg/dL — ABNORMAL HIGH (ref 0.44–1.00)
GFR, Estimated: 12 mL/min — ABNORMAL LOW (ref >60)

## 2020-08-19 LAB — TROPONIN I (HIGH SENSITIVITY): Troponin I (High Sensitivity): 27 ng/L — ABNORMAL HIGH (ref <18)

## 2020-08-19 LAB — HEMOGLOBIN A1C
Hgb A1c MFr Bld: 8.2 % — ABNORMAL HIGH (ref 4.8–5.6)
Mean Plasma Glucose: 188.64 mg/dL

## 2020-08-19 MED ORDER — METHYLPREDNISOLONE SODIUM SUCC 125 MG IJ SOLR
0.5000 mg/kg | Freq: Two times a day (BID) | INTRAMUSCULAR | Status: DC
  Administered 2020-08-19: 46.25 mg via INTRAVENOUS
  Filled 2020-08-19: qty 2

## 2020-08-19 MED ORDER — TACROLIMUS 1 MG PO CAPS: 3.0000 mg | ORAL_CAPSULE | Freq: Every day | ORAL | Status: DC

## 2020-08-19 MED ORDER — ACETAMINOPHEN 650 MG RE SUPP: 650.0000 mg | Freq: Four times a day (QID) | RECTAL | Status: DC | PRN

## 2020-08-19 MED ORDER — FLUTICASONE PROPIONATE 50 MCG/ACT NA SUSP
1.0000 | Freq: Every day | NASAL | Status: DC
  Administered 2020-08-20 – 2020-10-13 (×53): 1 via NASAL
  Filled 2020-08-19 (×3): qty 16

## 2020-08-19 MED ORDER — HEPARIN SODIUM (PORCINE) 5000 UNIT/ML IJ SOLN
5000.0000 [IU] | Freq: Three times a day (TID) | INTRAMUSCULAR | Status: DC
  Administered 2020-08-19 – 2020-08-25 (×19): 5000 [IU] via SUBCUTANEOUS
  Filled 2020-08-19 (×19): qty 1

## 2020-08-19 MED ORDER — INSULIN DETEMIR 100 UNIT/ML ~~LOC~~ SOLN
20.0000 [IU] | Freq: Two times a day (BID) | SUBCUTANEOUS | Status: DC
  Administered 2020-08-19 (×2): 20 [IU] via SUBCUTANEOUS
  Filled 2020-08-19 (×3): qty 0.2

## 2020-08-19 MED ORDER — POLYETHYLENE GLYCOL 3350 17 G PO PACK
17.0000 g | PACK | Freq: Every day | ORAL | Status: DC | PRN
Start: 2020-08-19 — End: 2020-10-13
  Filled 2020-08-19 (×2): qty 1

## 2020-08-19 MED ORDER — INSULIN REGULAR(HUMAN) IN NACL 100-0.9 UT/100ML-% IV SOLN
INTRAVENOUS | Status: DC
  Administered 2020-08-19: 0.9 [IU]/h via INTRAVENOUS
  Administered 2020-08-19: 11.5 [IU]/h via INTRAVENOUS
  Filled 2020-08-19: qty 100

## 2020-08-19 MED ORDER — SODIUM CHLORIDE 0.9 % IV SOLN
200.0000 mg | Freq: Once | INTRAVENOUS | Status: AC
  Administered 2020-08-19: 200 mg via INTRAVENOUS
  Filled 2020-08-19: qty 40

## 2020-08-19 MED ORDER — SODIUM CHLORIDE 0.45 % IV SOLN
INTRAVENOUS | Status: DC
  Filled 2020-08-19 (×6): qty 1000

## 2020-08-19 MED ORDER — PREDNISONE 20 MG PO TABS: 50.0000 mg | ORAL_TABLET | Freq: Every day | ORAL | Status: DC

## 2020-08-19 MED ORDER — LEVOTHYROXINE SODIUM 75 MCG PO TABS
75.0000 ug | ORAL_TABLET | Freq: Every day | ORAL | Status: DC
  Administered 2020-08-19 – 2020-08-29 (×11): 75 ug via ORAL
  Filled 2020-08-19 (×11): qty 1

## 2020-08-19 MED ORDER — SODIUM CHLORIDE 0.9 % IV SOLN
100.0000 mg | Freq: Every day | INTRAVENOUS | Status: AC
  Administered 2020-08-20 – 2020-08-23 (×4): 100 mg via INTRAVENOUS
  Filled 2020-08-19: qty 2.5
  Filled 2020-08-19 (×2): qty 20
  Filled 2020-08-19: qty 100

## 2020-08-19 MED ORDER — INSULIN ASPART 100 UNIT/ML ~~LOC~~ SOLN
3.0000 [IU] | SUBCUTANEOUS | Status: DC
  Administered 2020-08-19: 6 [IU] via SUBCUTANEOUS

## 2020-08-19 MED ORDER — AMLODIPINE BESYLATE 5 MG PO TABS
5.0000 mg | ORAL_TABLET | Freq: Every day | ORAL | Status: DC
  Administered 2020-08-19 – 2020-08-24 (×6): 5 mg via ORAL
  Filled 2020-08-19 (×6): qty 1

## 2020-08-19 MED ORDER — ASPIRIN EC 81 MG PO TBEC
81.0000 mg | DELAYED_RELEASE_TABLET | Freq: Every day | ORAL | Status: DC
  Administered 2020-08-19 – 2020-08-31 (×13): 81 mg via ORAL
  Filled 2020-08-19 (×13): qty 1

## 2020-08-19 MED ORDER — MYCOPHENOLATE SODIUM 180 MG PO TBEC
360.0000 mg | DELAYED_RELEASE_TABLET | Freq: Two times a day (BID) | ORAL | Status: DC
  Administered 2020-08-19: 360 mg via ORAL
  Filled 2020-08-19 (×2): qty 2

## 2020-08-19 MED ORDER — DOCUSATE SODIUM 100 MG PO CAPS: 100.0000 mg | ORAL_CAPSULE | Freq: Two times a day (BID) | ORAL | Status: DC | PRN

## 2020-08-19 MED ORDER — LABETALOL HCL 5 MG/ML IV SOLN
10.0000 mg | INTRAVENOUS | Status: DC | PRN
  Administered 2020-08-19: 10 mg via INTRAVENOUS
  Filled 2020-08-19 (×2): qty 4

## 2020-08-19 MED ORDER — SIMVASTATIN 20 MG PO TABS
10.0000 mg | ORAL_TABLET | Freq: Every day | ORAL | Status: DC
  Administered 2020-08-19 – 2020-08-30 (×12): 10 mg via ORAL
  Filled 2020-08-19 (×12): qty 1

## 2020-08-19 MED ORDER — DEXAMETHASONE SODIUM PHOSPHATE 10 MG/ML IJ SOLN
6.0000 mg | INTRAMUSCULAR | Status: DC
Start: 2020-08-19 — End: 2020-08-22
  Administered 2020-08-19 – 2020-08-21 (×3): 6 mg via INTRAVENOUS
  Filled 2020-08-19 (×3): qty 0.6
  Filled 2020-08-19: qty 1

## 2020-08-19 MED ORDER — DEXTROSE 50 % IV SOLN: 0.0000 mL | INTRAVENOUS | Status: DC | PRN

## 2020-08-19 MED ORDER — ACETAMINOPHEN 325 MG PO TABS
650.0000 mg | ORAL_TABLET | Freq: Four times a day (QID) | ORAL | Status: DC | PRN
  Administered 2020-08-20 – 2020-10-11 (×7): 650 mg via ORAL
  Filled 2020-08-19 (×10): qty 2

## 2020-08-19 MED ORDER — TACROLIMUS 1 MG PO CAPS
4.0000 mg | ORAL_CAPSULE | Freq: Every day | ORAL | Status: DC
Start: 2020-08-20 — End: 2020-09-07
  Administered 2020-08-20 – 2020-09-07 (×19): 4 mg via ORAL
  Filled 2020-08-19 (×19): qty 4

## 2020-08-19 MED ORDER — TACROLIMUS 1 MG PO CAPS
3.0000 mg | ORAL_CAPSULE | Freq: Every day | ORAL | Status: DC
  Administered 2020-08-20 – 2020-09-06 (×18): 3 mg via ORAL
  Filled 2020-08-19 (×19): qty 3

## 2020-08-19 MED ORDER — ZINC SULFATE 220 (50 ZN) MG PO CAPS
220.0000 mg | ORAL_CAPSULE | Freq: Every day | ORAL | Status: DC
  Administered 2020-08-19 – 2020-10-13 (×56): 220 mg via ORAL
  Filled 2020-08-19 (×56): qty 1

## 2020-08-19 MED ORDER — METOPROLOL TARTRATE 50 MG PO TABS
75.0000 mg | ORAL_TABLET | Freq: Two times a day (BID) | ORAL | Status: DC
  Administered 2020-08-19 – 2020-08-28 (×19): 75 mg via ORAL
  Filled 2020-08-19 (×9): qty 1
  Filled 2020-08-19: qty 3
  Filled 2020-08-19 (×7): qty 1
  Filled 2020-08-19 (×2): qty 3

## 2020-08-19 MED ORDER — ONDANSETRON HCL 4 MG/2ML IJ SOLN
INTRAMUSCULAR | Status: AC
  Filled 2020-08-19: qty 2

## 2020-08-19 MED ORDER — TACROLIMUS 1 MG PO CAPS
7.0000 mg | ORAL_CAPSULE | Freq: Every day | ORAL | Status: DC
  Filled 2020-08-19: qty 7

## 2020-08-19 MED ORDER — INSULIN ASPART 100 UNIT/ML ~~LOC~~ SOLN
0.0000 [IU] | Freq: Three times a day (TID) | SUBCUTANEOUS | Status: DC
  Administered 2020-08-19: 10 [IU] via SUBCUTANEOUS

## 2020-08-19 MED ORDER — ONDANSETRON HCL 4 MG/2ML IJ SOLN
4.0000 mg | Freq: Once | INTRAMUSCULAR | Status: AC
  Administered 2020-08-19: 4 mg via INTRAVENOUS

## 2020-08-19 MED ORDER — PANTOPRAZOLE SODIUM 40 MG PO TBEC
40.0000 mg | DELAYED_RELEASE_TABLET | Freq: Every day | ORAL | Status: DC
  Administered 2020-08-19 – 2020-09-02 (×15): 40 mg via ORAL
  Filled 2020-08-19 (×16): qty 1

## 2020-08-19 MED ORDER — INSULIN DETEMIR 100 UNIT/ML ~~LOC~~ SOLN
28.0000 [IU] | Freq: Two times a day (BID) | SUBCUTANEOUS | Status: DC
  Administered 2020-08-19: 28 [IU] via SUBCUTANEOUS
  Filled 2020-08-19 (×3): qty 0.28

## 2020-08-19 MED ORDER — TACROLIMUS 1 MG PO CAPS: 4.0000 mg | ORAL_CAPSULE | Freq: Every day | ORAL | Status: DC

## 2020-08-19 MED ORDER — TACROLIMUS 1 MG PO CAPS
6.0000 mg | ORAL_CAPSULE | Freq: Every day | ORAL | Status: DC
  Administered 2020-08-19: 6 mg via ORAL
  Filled 2020-08-19: qty 6

## 2020-08-19 MED ORDER — ASCORBIC ACID 500 MG PO TABS
500.0000 mg | ORAL_TABLET | Freq: Every day | ORAL | Status: DC
  Administered 2020-08-19 – 2020-10-13 (×56): 500 mg via ORAL
  Filled 2020-08-19 (×56): qty 1

## 2020-08-19 MED ORDER — HEPARIN SODIUM (PORCINE) 5000 UNIT/ML IJ SOLN: 5000.0000 [IU] | Freq: Three times a day (TID) | INTRAMUSCULAR | Status: DC

## 2020-08-19 NOTE — H&P (Signed)
History and Physical    [name] [name] NBZ:[id number] DOB: Nov 23, 1955 DOA: 08/18/2020  PCP: Glendale Chard, MD  Patient coming from: Home.  Chief Complaint: Shortness of breath chest pain generalized body ache and fatigue.  HPI: [name] [name] is a 65 y.o. female with with history of renal transplant on immunosuppressants, diabetes mellitus type 2, hypothyroidism, anemia has been tested positive for COVID about 10 days ago and has been gradually having worsening generalized body ache fatigue shortness of breath cough and chest pain.  Patient states she also has been having diarrhea last few days.  Has been a poor appetite with nausea vomiting.  ED Course: In the ER patient was easily desaturating on minimal exertion at this time on 2 L oxygen with chest x-ray showing multifocal pneumonia.  COVID test was positive.  Labs are significant for worsening renal function creatinine on September 2021 was around 2.2 which is around 3.8 with bicarb of 12 anion gap of 15.  CRP is 12.2.  ER physician discussed with on-call nephrologist Dr. Augustin Coupe no at this time advised to stop patting patient on bicarbonate drip.  At the time my exam patient is tachypneic and given the easily desaturation with COVID infection I started patient on steroids and remdesivir.  Review of Systems: As per HPI, rest all negative.   Past Medical History:  Diagnosis Date  . Blood transfusion   . Chronic kidney disease    s/p transplant, kidney wasn't removed  . Gout due to renal impairment 2010   "before finding out I had kidney failure"  . History of renal transplant   . Hyperlipidemia   . Hypertension   . Sickle cell trait ([place])   . Thyroid disease   . Type II diabetes mellitus ([place])   . UTI (lower urinary tract infection) 12/08/11   "first one ever"    Past Surgical History:  Procedure Laterality Date  . BREAST BIOPSY Left 08/07/2010   benign stereo  . kidney transplant  02/2009   August 2016 was  second transplant  . PARATHYROIDECTOMY    . TUBAL LIGATION  1980's  . VAGINAL HYSTERECTOMY  2006     reports that she quit smoking about 31 years ago. Her smoking use included cigarettes. She has a 5.00 pack-year smoking history. She has never used smokeless tobacco. She reports that she does not drink alcohol and does not use drugs.  Allergies  Allergen Reactions  . Codeine Hives  . Acetaminophen-Codeine Rash    Family History  Problem Relation Age of Onset  . Heart disease Father   . Hypertension Mother   . Breast cancer Cousin   . Hypertension Sister   . Hypertension Daughter   . Colon cancer Neg Hx   . Esophageal cancer Neg Hx   . Rectal cancer Neg Hx   . Stomach cancer Neg Hx     Prior to Admission medications   Medication Sig Start Date End Date Taking? Authorizing Provider  Alcohol Swabs (ALCOHOL PREP) PADS Use as directed 5 times per day with insulin pen 08/21/19   Glendale Chard, MD  amLODipine (NORVASC) 5 MG tablet Take 5 mg by mouth daily.     [provider]  aspirin EC 81 MG tablet Take 81 mg by mouth daily.    [provider]  azithromycin (ZITHROMAX Z-PAK) 250 MG tablet Take 2 tablets (500 mg) on  Day 1,  followed by 1 tablet (250 mg) once daily on Days 2 through 5. 08/14/20  08/19/20  Bary Castilla, NP  Blood Glucose Monitoring Suppl (ACCU-CHEK GUIDE ME) w/Device KIT USE AS DIRECTED ONCE A DAY 02/08/20   [provider]  Blood Glucose Monitoring Suppl (ACCU-CHEK GUIDE) w/Device KIT USE AS DIRECTED ONCE A DAY 02/12/20   [provider]  clotrimazole (LOTRIMIN) 1 % cream Apply to skin on both great toes once daily 07/02/19   Galaway, Stephani Police, DPM  diazepam (VALIUM) 2 MG tablet Take 1 tablet (2 mg total) by mouth every 12 (twelve) hours as needed for muscle spasms. 10/17/19 10/16/20  Glendale Chard, MD  glucose blood test strip USE TO TEST BLOOD SUGAR 4 TIMES DAILY AS DIRECTED 01/25/18   [provider]  GVOKE HYPOPEN 2-PACK  1 MG/0.2ML SOAJ Use in case of low blood sugar 08/14/20   Ghumman, Ramandeep, NP  Insulin Aspart (NOVOLOG FLEXPEN [place]) Inject 4 Units into the skin 3 (three) times daily. Sliding scale    [provider]  insulin detemir (LEVEMIR) 100 UNIT/ML FlexPen 30 units in the morning and 35 units in the evening 03/29/18   [provider]  levothyroxine (SYNTHROID) 75 MCG tablet TAKE 1 TABLET BY MOUTH EVERY MORNING ON AN EMPTY STOMACH 90 02/07/19   [provider]  lisinopril (PRINIVIL,ZESTRIL) 10 MG tablet Take by mouth. 03/06/18   [provider]  loperamide (IMODIUM) 2 MG capsule Take by mouth as needed for diarrhea or loose stools. One by mouth four times daily.    [provider]  metoprolol tartrate (LOPRESSOR) 25 MG tablet Take 75 mg by mouth 2 (two) times daily. 3 tabs 2 times per day    [provider]  mometasone (NASONEX) 50 MCG/ACT nasal spray Place 2 sprays into the nose daily. 10/11/14   Orpah Greek, MD  mycophenolate (MYFORTIC) 180 MG EC tablet Take 360 mg by mouth 2 (two) times daily.     [provider]  NONFORMULARY OR COMPOUNDED ITEM Antifungal solution: Terbinafine 3%, Fluconazole 2%, Tea Tree Oil 5%, Urea 10%, Ibuprofen 2% in DMSO suspension #15m 04/02/19   Galaway, JStephani Police DPM  omeprazole (PRILOSEC) 20 MG capsule Take 20 mg by mouth daily.    [provider]  predniSONE (DELTASONE) 5 MG tablet Take 5 mg by mouth daily with breakfast.    [provider]  simvastatin (ZOCOR) 10 MG tablet TAKE 1 TABLET BY MOUTH EVERY DAY IN THE EVENING 10/29/19   SGlendale Chard MD  tacrolimus (PROGRAF) 1 MG capsule 7 capsules in the morning, 6 capsules in the evening    [provider]    Physical Exam: Constitutional: Moderately built and nourished. Vitals:   08/18/20 2200 08/18/20 2230 08/18/20 2245 08/19/20 0015  BP: (!) 150/96 (!) 163/77 (!) 163/80   Pulse: 99 97 93 (!) 102  Resp: 10 (!) 34  12  Temp:       TempSrc:      SpO2: 90% 92% (!) 89% 94%  Height:       Eyes: Anicteric no pallor. ENMT: No discharge from the ears eyes nose and mouth. Neck: No mass felt.  No neck rigidity. Respiratory: No rhonchi or crepitations. Cardiovascular: S1-S2 heard. Abdomen: Soft nontender bowel sounds present. Musculoskeletal: No edema. Skin: No rash. Neurologic: Alert awake oriented place and person moving all extremities. Psychiatric: Oriented to name and place.   Labs on Admission: I have personally reviewed following labs and imaging studies  CBC: Recent Labs  Lab 08/18/20 1716  WBC 7.6  HGB 12.2  HCT  38.6  MCV 76.4*  PLT 275*   Basic Metabolic Panel: Recent Labs  Lab 08/18/20 1716  NA 132*  K 5.9*  CL 105  CO2 12*  GLUCOSE 326*  BUN 88*  CREATININE 3.83*  CALCIUM 9.7   GFR: CrCl cannot be calculated (Unknown ideal weight.). Liver Function Tests: No results for input(s): AST, ALT, ALKPHOS, BILITOT, PROT, ALBUMIN in the last 168 hours. No results for input(s): LIPASE, AMYLASE in the last 168 hours. No results for input(s): AMMONIA in the last 168 hours. Coagulation Profile: No results for input(s): INR, PROTIME in the last 168 hours. Cardiac Enzymes: No results for input(s): CKTOTAL, CKMB, CKMBINDEX, TROPONINI in the last 168 hours. BNP (last 3 results) No results for input(s): PROBNP in the last 8760 hours. HbA1C: No results for input(s): HGBA1C in the last 72 hours. CBG: No results for input(s): GLUCAP in the last 168 hours. Lipid Profile: Recent Labs    08/18/20 2128  TRIG 143   Thyroid Function Tests: No results for input(s): TSH, T4TOTAL, FREET4, T3FREE, THYROIDAB in the last 72 hours. Anemia Panel: Recent Labs    08/18/20 2030  FERRITIN 4,039*   Urine analysis:    Component Value Date/Time   COLORURINE AMBER (A) 08/18/2020 2337   APPEARANCEUR TURBID (A) 08/18/2020 2337   LABSPEC 1.012 08/18/2020 2337   PHURINE 5.0 08/18/2020 2337   GLUCOSEU 50 (A)  08/18/2020 2337   HGBUR SMALL (A) 08/18/2020 2337   BILIRUBINUR NEGATIVE 08/18/2020 2337   BILIRUBINUR negative 05/14/2020 1729   KETONESUR 5 (A) 08/18/2020 2337   PROTEINUR 100 (A) 08/18/2020 2337   UROBILINOGEN 0.2 05/14/2020 1729   UROBILINOGEN Normal 03/17/2017 0000   NITRITE NEGATIVE 08/18/2020 2337   LEUKOCYTESUR LARGE (A) 08/18/2020 2337   Sepsis Labs: '@LABRCNTIP' (procalcitonin:4,lacticidven:4) )No results found for this or any previous visit (from the past 240 hour(s)).   Radiological Exams on Admission: DG Chest Portable 1 View  Result Date: 08/18/2020 CLINICAL DATA:  65 year old female with history of COVID infection. Chest pain. Shortness of breath. EXAM: PORTABLE CHEST 1 VIEW COMPARISON:  Chest x-ray 10/20/2014. FINDINGS: Patchy multifocal airspace consolidation and areas of interstitial prominence are noted throughout the lungs bilaterally, most severe throughout the periphery of the mid to lower left lung, indicative of multilobar bilateral pneumonia. No definite pleural effusions. No evidence of pulmonary edema. No pneumothorax. Heart size is borderline enlarged. Upper mediastinal contours are within normal limits. Atherosclerotic calcifications in the thoracic aorta. Vascular stent projecting over the right subclavian region. IMPRESSION: 1. Severe multilobar bilateral (left greater than right) pneumonia compatible with reported COVID infection. 2. Aortic atherosclerosis. Electronically Signed   By: Vinnie Langton M.D.   On: 08/18/2020 18:02    EKG: Independently reviewed.  Normal sinus rhythm with diffuse T wave inversions and ST-T changes.  Assessment/Plan Principal Problem:   ARF (acute renal failure) (HCC) Active Problems:   History of renal transplant   Immunocompromised ([place])   DM (diabetes mellitus), type 2 with renal complications ([place])   Essential hypertension with goal blood pressure less than 140/90   Pneumonia due to COVID-19 virus    1. Acute respiratory  failure with hypoxia secondary to COVID-pneumonia presently on 2 L oxygen at the time of my exam we will keep patient on IV Solu-Medrol and remdesivir.  Closely monitor respiratory status and inflammatory markers.  If patient's oxygen requirements go up then patient may be a candidate for baricitinib only if patient's GFR improved as per the pharmacy. 2. Acute on chronic  kidney disease on a renal transplant patient for which Dr. Orene Desanctis on-call nephrologist advised keeping patient on bicarbonate drip.  Which has been placed.  Follow metabolic panel intake output.  Patient is on immunosuppressants mycophenolate and tacrolimus and IV steroids now.  Tacrolimus levels have been sent. 3. Diabetes mellitus type 2 on Levemir 28 units twice daily.  We will closely follow CBG since patient is on steroids now. 4. Hypothyroidism on Synthroid. 5. Anemia likely from renal disease follow CBC. 6. Hypertension on metoprolol and amlodipine.  Patient is acute respiratory failure with COVID infection with worsening renal function will need close monitoring for any further worsening and inpatient status.  I tried to reach patient's daughter multiple times with the number provided unable to reach.   DVT prophylaxis: Heparin. Code Status: Full code. Family Communication: Unable to reach patient's daughter. Disposition Plan: Home when stable. Consults called: ER physician discussed with nephrologist. Admission status: Inpatient.   Rise Patience MD [facility] Pager [phone].  If 7PM-7AM, please contact night-coverage [email or web address] Password [facility]  08/19/2020, 12:21 AM

## 2020-08-19 NOTE — ED Notes (Signed)
Lunch Tray Ordered @ [id number].

## 2020-08-19 NOTE — ED Notes (Signed)
Checked patient blood sugar it was 323 notified RN of blood sugar patient is resting with call bell in reach

## 2020-08-19 NOTE — Consult Note (Signed)
[name] [name] Admit Date: 08/18/2020 08/19/2020 [name] [name]  Requesting Physician:  Kipp Brood, MD  Reason for Consult:  Acute on chronic kidney disease, hx of renal transplant x2  HPI:   [name] [name] is a 65 year old female with history of renal transplant x2 (last one in 2016) on myfortic, prograf, and steroids, T2DM, hypothyroidism, and anemia who tested positive for COVID about 10 days ago. She was brought in by EMS for progressively worsening SHOB, cough, chest pain, and generalized body aches.   History was limited as patient was somnolent on exam.  Per chart review, patient diagnosed with COVID about on 1/5 and has been having worsening dyspnea, cough, chest pain, and myalgia. Also had nausea and diarrhea, but both of these seem to have resolved. She is vaccinated x2 along with booster shot in November 2021. On admission, CXR showing multifocal pneumonia consistent with COVID-19 PNA. Care for this as per primary team.   Nephrology consulted due to worsening renal function along with metabolic acidosis and hyperkalemia in the setting of kidney transplant history (on myfortic, prograf, and chronic low dose prednisone for immunosuppression).  During my encounter, patient was able to provide some information with frequent reorientation. She states that she does still produce urine and that she usually goes every 20-30 minutes. States that she has not had any flank/abdominal/back pain, changes in urination, dysuria, or hematuria in the days prior to arrival. Patient reports that she has had two kidney transplants in the past with the second one in 2016. States that her kidneys have been doing fine since and that she has been following up with Dr. Arty Baumgartner for this. States that she was supposed to go for routine labs last week but was unable to make it due to COVID symptoms. She states she has an outpatient appointment scheduled with nephrology on 08/22/2020.  Per chart  review, appears that patient's ESRD was presumed to be 2/2 DM and HTN. She received a second kidney transplant on 03/29/15 (CD DDRT with KDPI 90% and PRA 24%).    PMH Incudes: Past Medical History:  Diagnosis Date  . Blood transfusion   . Chronic kidney disease    s/p transplant, kidney wasn't removed  . Gout due to renal impairment 2010   "before finding out I had kidney failure"  . History of renal transplant   . Hyperlipidemia   . Hypertension   . Sickle cell trait ([place])   . Thyroid disease   . Type II diabetes mellitus ([employer])   . UTI (lower urinary tract infection) 12/08/11   "first one ever"       Creatinine  Date Value  03/17/2017 1.7 (A)  12/15/2016 1.4 mg/dL (A)   Creatinine, Ser (mg/dL)  Date Value  08/19/2020 3.99 (H)  08/18/2020 3.83 (H)  04/27/2020 2.26 (H)  08/21/2018 1.62 (H)  12/07/2012 11.43 (H)  11/07/2012 8.17 (H)  10/22/2012 6.74 (H)  09/29/2012 6.15 (H)  12/09/2011 2.88 (H)  12/08/2011 3.13 (H)    ROS: Balance of 12 systems is negative w/ exceptions as listed in HPI.  PMH  Past Medical History:  Diagnosis Date  . Blood transfusion   . Chronic kidney disease    s/p transplant, kidney wasn't removed  . Gout due to renal impairment 2010   "before finding out I had kidney failure"  . History of renal transplant   . Hyperlipidemia   . Hypertension   . Sickle cell trait ([place])   . Thyroid disease   .  Type II diabetes mellitus ([place])   . UTI (lower urinary tract infection) 12/08/11   "first one ever"   PSH  Past Surgical History:  Procedure Laterality Date  . BREAST BIOPSY Left 08/07/2010   benign stereo  . kidney transplant  02/2009   August 2016 was second transplant  . PARATHYROIDECTOMY    . TUBAL LIGATION  1980's  . VAGINAL HYSTERECTOMY  2006   FH  Family History  Problem Relation Age of Onset  . Heart disease Father   . Hypertension Mother   . Breast cancer Cousin   . Hypertension Sister   . Hypertension Daughter   . Colon  cancer Neg Hx   . Esophageal cancer Neg Hx   . Rectal cancer Neg Hx   . Stomach cancer Neg Hx    SH  reports that she quit smoking about 31 years ago. Her smoking use included cigarettes. She has a 5.00 pack-year smoking history. She has never used smokeless tobacco. She reports that she does not drink alcohol and does not use drugs.   Allergies  Allergies  Allergen Reactions  . Codeine Hives  . Acetaminophen-Codeine Rash   Home medications Prior to Admission medications   Medication Sig Start Date End Date Taking? Authorizing Provider  Alcohol Swabs (ALCOHOL PREP) PADS Use as directed 5 times per day with insulin pen 08/21/19  Yes Glendale Chard, MD  amLODipine (NORVASC) 5 MG tablet Take 5 mg by mouth daily.    Yes [provider]  aspirin EC 81 MG tablet Take 81 mg by mouth daily.   Yes [provider]  Blood Glucose Monitoring Suppl (ACCU-CHEK GUIDE ME) w/Device KIT USE AS DIRECTED ONCE A DAY 02/08/20  Yes [provider]  Blood Glucose Monitoring Suppl (ACCU-CHEK GUIDE) w/Device KIT USE AS DIRECTED ONCE A DAY 02/12/20  Yes [provider]  cinacalcet (SENSIPAR) 30 MG tablet Take 30 mg by mouth daily.   Yes [provider]  diazepam (VALIUM) 2 MG tablet Take 1 tablet (2 mg total) by mouth every 12 (twelve) hours as needed for muscle spasms. 10/17/19 10/16/20 Yes Glendale Chard, MD  glucose blood test strip USE TO TEST BLOOD SUGAR 4 TIMES DAILY AS DIRECTED 01/25/18  Yes [provider]  GVOKE HYPOPEN 2-PACK 1 MG/0.2ML SOAJ Use in case of low blood sugar 08/14/20  Yes Ghumman, Ramandeep, NP  Insulin Aspart (NOVOLOG FLEXPEN [place]) Inject 4 Units into the skin 3 (three) times daily. Sliding scale   Yes [provider]  insulin detemir (LEVEMIR) 100 UNIT/ML FlexPen Inject 30-35 Units into the skin See admin instructions. 30 units in the morning and 35 units in the evening 03/29/18  Yes [provider]  levothyroxine (SYNTHROID) 75 MCG  tablet Take 75 mcg by mouth daily before breakfast. 02/07/19  Yes [provider]  lisinopril (PRINIVIL,ZESTRIL) 10 MG tablet Take 10 mg by mouth daily. 03/06/18  Yes [provider]  loperamide (IMODIUM) 2 MG capsule Take by mouth as needed for diarrhea or loose stools. One by mouth four times daily.   Yes [provider]  metoprolol tartrate (LOPRESSOR) 25 MG tablet Take 75 mg by mouth 2 (two) times daily. 3 tabs 2 times per day   Yes [provider]  mometasone (NASONEX) 50 MCG/ACT nasal spray Place 2 sprays into the nose daily. 10/11/14  Yes Orpah Greek, MD  mycophenolate (MYFORTIC) 180 MG EC tablet Take 360 mg by mouth 2 (two) times daily.    Yes  [provider]  omeprazole (PRILOSEC) 20 MG capsule Take 20 mg by mouth daily.   Yes [provider]  predniSONE (DELTASONE) 5 MG tablet Take 5 mg by mouth daily with breakfast.   Yes [provider]  simvastatin (ZOCOR) 10 MG tablet TAKE 1 TABLET BY MOUTH EVERY DAY IN THE EVENING Patient taking differently: Take 10 mg by mouth daily at 6 PM. 10/29/19  Yes Glendale Chard, MD  tacrolimus (PROGRAF) 1 MG capsule 7 capsules in the morning, 6 capsules in the evening   Yes [provider]  azithromycin (ZITHROMAX Z-PAK) 250 MG tablet Take 2 tablets (500 mg) on  Day 1,  followed by 1 tablet (250 mg) once daily on Days 2 through 5. Patient not taking: Reported on 08/19/2020 08/14/20 08/19/20  Bary Castilla, NP    Current Medications Scheduled Meds: . amLODipine  5 mg Oral Daily  . vitamin C  500 mg Oral Daily  . aspirin EC  81 mg Oral Daily  . dexamethasone (DECADRON) injection  6 mg Intravenous Q24H  . fluticasone  1 spray Each Nare Daily  . heparin  5,000 Units Subcutaneous Q8H  . levothyroxine  75 mcg Oral Q0600  . metoprolol tartrate  75 mg Oral BID  . pantoprazole  40 mg Oral Daily  . simvastatin  10 mg Oral q1800  . [START ON 08/20/2020] tacrolimus  3 mg Oral QHS  .  [START ON 08/20/2020] tacrolimus  4 mg Oral Daily  . zinc sulfate  220 mg Oral Daily   Continuous Infusions: . insulin 11.5 Units/hr (08/19/20 0900)  . [START ON 08/20/2020] remdesivir 100 mg in NS 100 mL    . sodium chloride 0.45 % 1,000 mL with sodium bicarbonate 75 mEq infusion 100 mL/hr at 08/19/20 0124   PRN Meds:.acetaminophen **OR** acetaminophen, dextrose, labetalol  CBC Recent Labs  Lab 08/18/20 1716 08/19/20 0216 08/19/20 0808  WBC 7.6 5.8  --   HGB 12.2 11.4* 10.9*  HCT 38.6 33.0* 32.0*  MCV 76.4* 74.2*  --   PLT 104* 94*  --    Basic Metabolic Panel Recent Labs  Lab 08/18/20 1716 08/19/20 0216 08/19/20 0808  NA 132*  --  134*  K 5.9*  --  6.3*  CL 105  --   --   CO2 12*  --   --   GLUCOSE 326*  --   --   BUN 88*  --   --   CREATININE 3.83* 3.99*  --   CALCIUM 9.7  --   --     Physical Exam  Blood pressure (!) 189/89, pulse 92, temperature 98.3 F (36.8 C), temperature source Oral, resp. rate (!) 37, height _0  (1.575 m), weight 92.5 kg, SpO2 100 %. GEN: ill-appearing obese elderly female, somnolent on exam, in respiratory distress. HENT: moist mucous membranes, no nasal discharge. EYES: PERRL, EOMI. CV: normal rate and regular rhythm, no m/r/g. PULM: Tachypneic with respiratory distress and accessory muscle usage. No wheezing, stridor, or rales heard on exam. ABD: soft, nontender, nondistended abdomen. +BS. No suprapubic tenderness/fullness noted. SKIN: no rashes or jaundice EXT: mild non-pitting edema in bilateral lower extremities. NEURO: Somnolent on exam, but does respond to reorientation.   Assessment  [name] [name] is a 65 yo female with history of renal transplant x2 (last one in 2016) on prograf, myfortic, and low-dose prednisone, T2DM, hypothyroidism, anemia, and recent diagnosis of COVID-19 on 08/13/2020 here with symptoms consistent with COVID-19 PNA. Also found to have worsening  AoCKD and hyperkalemia on labs.    Plan:  Acute hypoxic  respiratory failure 2/2 COVID-19 PNA As per chart review, diagnosed on 08/13/2020 but symptoms present several days prior to diagnosis.  - O2 sats >90% on heated high flow [place] - on remdesivir and decadron - ICS and flutter valve - care per primary team/PCCM - somnolent on exam and still appearing to be in respiratory distress; may benefit from BiPAP   Acute on chronic kidney disease Metabolic acidosis Hyperkalemia Patient with history of renal transplant x2 (with second transplant in 2016) on immunosuppression with myfortic, prograf, and low-dose prednisone). Unsure what patient's baseline creatinine is. Baseline Cr about a year ago was around 1.6, but a few months ago Cr was 2.26. On admission, Cr 3.83. BMP on admission showing K 5.9 and bicarb 12. Patient's acutely worsening renal function is likely multifactorial in etiology stemming from COVID-19 infection, dehydration from DKA, and possible increased tacrolimus levels in the setting of recent diarrhea. Patient appears to be routinely following with nephrology in regards to kidney transplant with no issues so lower suspicion for delayed transplant rejection at this time. Patient does have a functional fistula in place, but she may require CRT if no improvement in kidney function. -holding myfortic to allow for patient to mount a response against viral infection -holding tacrolimus while awaiting tacrolimus level  -bicarb drip started -renal function panel showing stable Cr and K back down to 5.4, so will hold off on urgent HD at this time -continue insulin infusion per [employer] as per primary team -trend renal indices, UOP, strict I/O's  Mild DKA  Patient presented with glucose 326, sodium 132, bicarb 12, potassium 5.9, and AG 15. Mild ketonuria seen on urinalysis. Although lactate has been wnl x2, so it is possible that this is from starvation ketosis as patient has not been eating well since onset of COVID-19 infection. Nonetheless, patient's  clinical condition has improved since beginning insulin infusion per [employer],  -CBGs 410 > 421 > 323 > 287 > 214 > 149 -HbA1c 8.2 -can switch to subcutaneous insulin as per primary team -bicarb drip started -renal/carb modified diet with fluid restriction to 1.2L   Plans communicated to the primary team.   [name] [name]  Pager: [phone]  08/19/2020, 9:32 AM

## 2020-08-19 NOTE — ED Notes (Signed)
PT RR sustained in upper 30's to low 40's. Pt placed on 6 L [place].

## 2020-08-19 NOTE — ED Notes (Signed)
This RN clean and changed pt gown, linen, brief and reposition pt in bed

## 2020-08-19 NOTE — ED Notes (Signed)
Multiple attempts to obtain BMP will wait until morning labs

## 2020-08-19 NOTE — ED Notes (Signed)
Per MD Hal Hope, RN to initiate HFNC

## 2020-08-19 NOTE — ED Notes (Signed)
This RN updated pt daughter on pt progress and plan of care

## 2020-08-19 NOTE — ED Notes (Signed)
+  tele Breakfast Ordered 

## 2020-08-19 NOTE — Progress Notes (Signed)
NAME:  [name] [name], MRN:  [id number], DOB:  12/22/1955, LOS: 0 ADMISSION DATE:  08/18/2020, CONSULTATION DATE:  08/18/2020 REFERRING MD:  Jamse Arn - TRH, CHIEF COMPLAINT: Hypoxic respiratory failure  HPI/course in hospital  Limited history as patient is in respiratory distress and somnolent.  Per the chart patient developed symptoms of chills cough fatigue on 12/29 and tested positive 1/5.  She is fully vaccinated with her booster in November.  Positive family contacts.  Initially had nausea and diarrhea which have both subsided.  Was referred for monoclonal antibody but was deemed too sick for outpatient treatment and emergency department referral was recommended at that time.  Brought in yesterday evening to emergency department via EMS for worsening chest pain shortness of breath and generalized weakness.  Initially oxygen saturations 96% on 2 L nasal cannula.  Admitted to hospitalist service.  Found to have multifocal pneumonia on chest x-ray compatible with COVID-19 pneumonia but also worsening renal function with significant metabolic acidosis.  Worsening tachypnea this morning with escalation in oxygen requirements.  Started on steroids and remdesivir.   Past Medical History   Past Medical History:  Diagnosis Date  . Blood transfusion   . Chronic kidney disease    s/p transplant, kidney wasn't removed  . Gout due to renal impairment 2010   "before finding out I had kidney failure"  . History of renal transplant   . Hyperlipidemia   . Hypertension   . Sickle cell trait ([place])   . Thyroid disease   . Type II diabetes mellitus ([place])   . UTI (lower urinary tract infection) 12/08/11   "first one ever"     Past Surgical History:  Procedure Laterality Date  . BREAST BIOPSY Left 08/07/2010   benign stereo  . kidney transplant  02/2009   August 2016 was second transplant  . PARATHYROIDECTOMY    . TUBAL LIGATION  1980's  . VAGINAL HYSTERECTOMY  2006     Review of  Systems:   Review of Systems  Constitutional: Positive for chills and fever.  HENT: Negative.   Eyes: Negative.   Respiratory: Positive for cough and shortness of breath.   Cardiovascular: Positive for chest pain.  Gastrointestinal: Positive for nausea and vomiting.  Genitourinary: Negative.   Musculoskeletal: Positive for myalgias.  Skin: Negative.   Neurological: Negative.   Endo/Heme/Allergies: Negative.   Psychiatric/Behavioral: Negative.     Social History   reports that she quit smoking about 31 years ago. Her smoking use included cigarettes. She has a 5.00 pack-year smoking history. She has never used smokeless tobacco. She reports that she does not drink alcohol and does not use drugs.   Family History   Her family history includes Breast cancer in her cousin; Heart disease in her father; Hypertension in her daughter, mother, and sister. There is no history of Colon cancer, Esophageal cancer, Rectal cancer, or Stomach cancer.   Allergies Allergies  Allergen Reactions  . Codeine Hives  . Acetaminophen-Codeine Rash     Home Medications  Prior to Admission medications   Medication Sig Start Date End Date Taking? Authorizing Provider  Alcohol Swabs (ALCOHOL PREP) PADS Use as directed 5 times per day with insulin pen 08/21/19  Yes Glendale Chard, MD  amLODipine (NORVASC) 5 MG tablet Take 5 mg by mouth daily.    Yes [provider]  aspirin EC 81 MG tablet Take 81 mg by mouth daily.   Yes [provider]  Blood Glucose Monitoring Suppl ([place])  w/Device KIT USE AS DIRECTED ONCE A DAY 02/08/20  Yes [provider]  Blood Glucose Monitoring Suppl (ACCU-CHEK GUIDE) w/Device KIT USE AS DIRECTED ONCE A DAY 02/12/20  Yes [provider]  cinacalcet (SENSIPAR) 30 MG tablet Take 30 mg by mouth daily.   Yes [provider]  diazepam (VALIUM) 2 MG tablet Take 1 tablet (2 mg total) by mouth every 12 (twelve) hours as needed for muscle  spasms. 10/17/19 10/16/20 Yes Glendale Chard, MD  glucose blood test strip USE TO TEST BLOOD SUGAR 4 TIMES DAILY AS DIRECTED 01/25/18  Yes [provider]  GVOKE HYPOPEN 2-PACK 1 MG/0.2ML SOAJ Use in case of low blood sugar 08/14/20  Yes Ghumman, Ramandeep, NP  Insulin Aspart (NOVOLOG FLEXPEN [place]) Inject 4 Units into the skin 3 (three) times daily. Sliding scale   Yes [provider]  insulin detemir (LEVEMIR) 100 UNIT/ML FlexPen Inject 30-35 Units into the skin See admin instructions. 30 units in the morning and 35 units in the evening 03/29/18  Yes [provider]  levothyroxine (SYNTHROID) 75 MCG tablet Take 75 mcg by mouth daily before breakfast. 02/07/19  Yes [provider]  lisinopril (PRINIVIL,ZESTRIL) 10 MG tablet Take 10 mg by mouth daily. 03/06/18  Yes [provider]  loperamide (IMODIUM) 2 MG capsule Take by mouth as needed for diarrhea or loose stools. One by mouth four times daily.   Yes [provider]  metoprolol tartrate (LOPRESSOR) 25 MG tablet Take 75 mg by mouth 2 (two) times daily. 3 tabs 2 times per day   Yes [provider]  mometasone (NASONEX) 50 MCG/ACT nasal spray Place 2 sprays into the nose daily. 10/11/14  Yes Orpah Greek, MD  mycophenolate (MYFORTIC) 180 MG EC tablet Take 360 mg by mouth 2 (two) times daily.    Yes [provider]  omeprazole (PRILOSEC) 20 MG capsule Take 20 mg by mouth daily.   Yes [provider]  predniSONE (DELTASONE) 5 MG tablet Take 5 mg by mouth daily with breakfast.   Yes [provider]  simvastatin (ZOCOR) 10 MG tablet TAKE 1 TABLET BY MOUTH EVERY DAY IN THE EVENING Patient taking differently: Take 10 mg by mouth daily at 6 PM. 10/29/19  Yes Glendale Chard, MD  tacrolimus (PROGRAF) 1 MG capsule 7 capsules in the morning, 6 capsules in the evening   Yes [provider]  azithromycin (ZITHROMAX Z-PAK) 250 MG tablet Take 2 tablets (500 mg) on  Day 1,   followed by 1 tablet (250 mg) once daily on Days 2 through 5. Patient not taking: Reported on 08/19/2020 08/14/20 08/19/20  Bary Castilla, NP     Interim history/subjective:  Patient states that breathing has improved on high flow nasal cannula.  Objective   Blood pressure (!) 189/89, pulse 92, temperature 98.3 F (36.8 C), temperature source Oral, resp. rate (!) 37, height _0  (1.575 m), weight 92.5 kg, SpO2 100 %.    FiO2 (%):  [100 %] 100 %   Intake/Output Summary (Last 24 hours) at 08/19/2020 0936 Last data filed at 08/19/2020 0314 Gross per 24 hour  Intake 250 ml  Output --  Net 250 ml   Filed Weights   08/19/20 0017  Weight: 92.5 kg    Examination:  Physical Exam Constitutional:      General: She is in acute distress.     Appearance: She is obese. She is ill-appearing.  Eyes:     Extraocular Movements: Extraocular movements  intact.  Neck:     Thyroid: No thyromegaly.     Vascular: JVD present.     Trachea: No tracheal deviation.  Cardiovascular:     Rate and Rhythm: Normal rate and regular rhythm.     Heart sounds: Normal heart sounds.  Pulmonary:     Effort: Tachypnea, accessory muscle usage and respiratory distress present.     Breath sounds: No stridor. Examination of the right-lower field reveals rales. Examination of the left-lower field reveals rales. Rales present. No wheezing or rhonchi.  Abdominal:     Palpations: Abdomen is soft.  Musculoskeletal:     Right lower leg: No edema.     Left lower leg: Edema present.     Right upper arm fistula with palpable thrill.  Skin:    General: Skin is dry.  Neurological:     General: No focal deficit present.     Mental Status: She is alert and oriented to person, place, and time.     Ancillary tests (personally reviewed)  CBC: Recent Labs  Lab 08/18/20 1716 08/19/20 0216 08/19/20 0808  WBC 7.6 5.8  --   HGB 12.2 11.4* 10.9*  HCT 38.6 33.0* 32.0*  MCV 76.4* 74.2*  --   PLT 104* 94*  --      Basic Metabolic Panel: Recent Labs  Lab 08/18/20 1716 08/19/20 0216 08/19/20 0808  NA 132*  --  134*  K 5.9*  --  6.3*  CL 105  --   --   CO2 12*  --   --   GLUCOSE 326*  --   --   BUN 88*  --   --   CREATININE 3.83* 3.99*  --   CALCIUM 9.7  --   --    GFR: Estimated Creatinine Clearance: 14.9 mL/min (A) (by C-G formula based on SCr of 3.99 mg/dL (H)). Recent Labs  Lab 08/18/20 1716 08/18/20 2030 08/18/20 2128 08/18/20 2230 08/19/20 0216  PROCALCITON  --  0.46  --   --   --   WBC 7.6  --   --   --  5.8  LATICACIDVEN  --   --  1.7 1.6  --     Liver Function Tests: No results for input(s): AST, ALT, ALKPHOS, BILITOT, PROT, ALBUMIN in the last 168 hours. No results for input(s): LIPASE, AMYLASE in the last 168 hours. No results for input(s): AMMONIA in the last 168 hours.  ABG    Component Value Date/Time   PHART 7.266 (L) 08/19/2020 0808   PCO2ART 32.3 08/19/2020 0808   PO2ART 186 (H) 08/19/2020 0808   HCO3 14.6 (L) 08/19/2020 0808   TCO2 16 (L) 08/19/2020 0808   ACIDBASEDEF 11.0 (H) 08/19/2020 0808   O2SAT 99.0 08/19/2020 0808     Coagulation Profile: No results for input(s): INR, PROTIME in the last 168 hours.  Cardiac Enzymes: No results for input(s): CKTOTAL, CKMB, CKMBINDEX, TROPONINI in the last 168 hours.  HbA1C: Hemoglobin A1C  Date/Time Value Ref Range Status  03/17/2017 12:00 AM 8.3  Final   Hgb A1c MFr Bld  Date/Time Value Ref Range Status  08/19/2020 02:17 AM 8.2 (H) 4.8 - 5.6 % Final    Comment:    REPEATED TO VERIFY (NOTE) Pre diabetes:          5.7%-6.4%  Diabetes:              >6.4%  Glycemic control for   <7.0% adults with diabetes   12/08/2011 05:49 PM  6.6 (H) <5.7 % Final    Comment:    (NOTE)                                                                       According to the ADA Clinical Practice Recommendations for 2011, when HbA1c is used as a screening test:  >=6.5%   Diagnostic of Diabetes Mellitus            (if abnormal result is confirmed) 5.7-6.4%   Increased risk of developing Diabetes Mellitus References:Diagnosis and Classification of Diabetes Mellitus,Diabetes DXIP,3825,05(LZJQB 1):S62-S69 and Standards of Medical Care in         Diabetes - 2011,Diabetes HALP,3790,24 (Suppl 1):S11-S61.    CBG: Recent Labs  Lab 08/19/20 0128 08/19/20 0731 08/19/20 0821  GLUCAP 368* 405* 410*   Chest x-ray 1/11 (personally reviewed): Bibasilar interstitial infiltrates with normal vascular pedicle.  Picture most compatible with viral pneumonia.  Assessment & Plan:   Critically ill due to acute hypoxic respiratory failure secondary to COVID-19 pneumonia requiring heated high flow nasal cannula. -Titrate heated high flow to keep saturations greater than 87% and respiratory rate less than 30. -High risk for intubation. -Trial of BiPAP if necessary  Critically ill due to acute kidney injury with severe metabolic acidosis and hyperkalemia.  Severely oliguric at this time. Likely combination of hypovolemia and tacrolimus related kidney failure -Continue bicarbonate infusion for now -Initiate insulin infusion to correct hyperglycemia, will also help with hyperkalemia. -Patient will need hemodialysis acutely - have spoken to Dr Candiss Norse who will arrange for IHD.  Critically ill due to hyperosmolar hyperglycemic state without ketosis -Initiate phase 2 glycemic protocol: IV insulin with Endo tool.  COVID-19 pneumonia -Dexamethasone 6 mg for 10-day -Continue remdesivir  Status post renal transplantation -Continue mycophenolate -Hold tacrolimus today as likely in toxic range.  Obtain tacrolimus level, not available in real-time.  Restart at half dose tomorrow.   Type 2 diabetes mellitus on insulin -Resume home subcutaneous insulin once hypoglycemia and hyperkalemia resolved.  Hypertension -As needed labetalol -Resume oral antihypertensives as respiratory status allows  Hypothyroidism -Continue home  dose levothyroxine.  Daily Goals Checklist  Pain/Anxiety/Delirium protocol (if indicated): None required VAP protocol (if indicated): Not intubated Respiratory support goals: Heated high flow to keep saturations greater than 88%, BiPAP as needed Blood pressure target: Keep systolic less than 097 DVT prophylaxis: Heparin 3 times daily Nutritional status and feeding goals: Moderate nutritional risk.  Encourage oral intake GI prophylaxis: not indicated.  Fluid status goals: Continue bicarbonate infusion Urinary catheter: Assessment of intravascular volume Central lines:  PIV only at this time. Glucose control: IV insulin Mobility/therapy needs: BR privileges Antibiotic de-escalation: remdesivir and steroids.  Home medication reconciliation: anti-rejection medications ordered Daily labs: BMP every 4h for now Code Status: Full code  Family Communication: will update family Disposition: ICU  Critical care time: 61 minutes    Kipp Brood, MD [facility] ICU Physician [place]  Pager: [phone] Or Epic Secure Chat After hours: [phone].  08/19/2020, 9:36 AM

## 2020-08-19 NOTE — ED Notes (Addendum)
PT RR continues to remain in upper 30's to low 40's. MD Nichola Sizer notified for HFNC.

## 2020-08-19 NOTE — ED Notes (Addendum)
Per MD Sighn, wait to administer D5LR as per hyperglycemia protocol. MD will consult with CC, Agarwala about additional maintenance fluids.

## 2020-08-19 NOTE — ED Notes (Signed)
[name] [name], [phone], daughter called for update.

## 2020-08-19 NOTE — Progress Notes (Signed)
Patient placed on 10L HFNC at this time. Patient stated her breathing felt better on HFNC. Patient's RR is anywhere between 41s and 63s. RN made aware.

## 2020-08-19 NOTE — ED Notes (Signed)
MD Jamse Arn paged for pt CGB 405 and RR sustained between upper 30's-40s while on 15 L [place]. NRB placed.

## 2020-08-19 NOTE — ED Notes (Signed)
Lunch Tray Ordered @ 1715. 

## 2020-08-19 NOTE — Progress Notes (Signed)
Pharmacy Consult - transplant medication dosing  89 yof with history of renal transplant presenting COVID-19+. Patient on immunosuppressants tacrolimus and mycophenolate PTA. Tacrolimus level from presentation is in process. Noted AKI - SCr up to 3.99 today. CCM reduced tacrolimus dose to 4mg  qAM/3mg  QHS (previously 7mg  qAM/6mg  QHS). Pharmacy consulted to monitor transplant medication dosing.  Plan: Continue current transplant medication dosing for now (tacrolimus dose reduced per CCM on 1/11) and f/u tacrolimus level, renal function trend   [name] [name], PharmD, BCPS Please check AMION for all [facility] contact numbers Clinical Pharmacist 08/19/2020 8:35 AM'

## 2020-08-20 DIAGNOSIS — U071 COVID-19: Secondary | ICD-10-CM | POA: Diagnosis not present

## 2020-08-20 DIAGNOSIS — J9601 Acute respiratory failure with hypoxia: Secondary | ICD-10-CM | POA: Diagnosis not present

## 2020-08-20 DIAGNOSIS — E875 Hyperkalemia: Secondary | ICD-10-CM | POA: Diagnosis present

## 2020-08-20 DIAGNOSIS — E1010 Type 1 diabetes mellitus with ketoacidosis without coma: Secondary | ICD-10-CM | POA: Diagnosis present

## 2020-08-20 LAB — D-DIMER, QUANTITATIVE: D-Dimer, Quant: 1.61 ug/mL-FEU — ABNORMAL HIGH (ref 0.00–0.50)

## 2020-08-20 LAB — BLOOD GAS, VENOUS
Acid-base deficit: 6.4 mmol/L — ABNORMAL HIGH (ref 0.0–2.0)
Bicarbonate: 19 mmol/L — ABNORMAL LOW (ref 20.0–28.0)
FIO2: 100
O2 Saturation: 85.1 %
Patient temperature: 37
pCO2, Ven: 40.5 mmHg — ABNORMAL LOW (ref 44.0–60.0)
pH, Ven: 7.293 (ref 7.250–7.430)
pO2, Ven: 54.5 mmHg — ABNORMAL HIGH (ref 32.0–45.0)

## 2020-08-20 LAB — CBC WITH DIFFERENTIAL/PLATELET
Abs Immature Granulocytes: 0.04 10*3/uL (ref 0.00–0.07)
Basophils Absolute: 0 10*3/uL (ref 0.0–0.1)
Basophils Relative: 0 %
Eosinophils Absolute: 0 10*3/uL (ref 0.0–0.5)
Eosinophils Relative: 0 %
HCT: 33.4 % — ABNORMAL LOW (ref 36.0–46.0)
Hemoglobin: 11.1 g/dL — ABNORMAL LOW (ref 12.0–15.0)
Immature Granulocytes: 1 %
Lymphocytes Relative: 5 %
Lymphs Abs: 0.3 10*3/uL — ABNORMAL LOW (ref 0.7–4.0)
MCH: 24.6 pg — ABNORMAL LOW (ref 26.0–34.0)
MCHC: 33.2 g/dL (ref 30.0–36.0)
MCV: 73.9 fL — ABNORMAL LOW (ref 80.0–100.0)
Monocytes Absolute: 0.2 10*3/uL (ref 0.1–1.0)
Monocytes Relative: 4 %
Neutro Abs: 5.5 10*3/uL (ref 1.7–7.7)
Neutrophils Relative %: 90 %
Platelets: 96 10*3/uL — ABNORMAL LOW (ref 150–400)
RBC: 4.52 MIL/uL (ref 3.87–5.11)
RDW: 14.4 % (ref 11.5–15.5)
WBC: 6.1 10*3/uL (ref 4.0–10.5)
nRBC: 0 % (ref 0.0–0.2)

## 2020-08-20 LAB — GLUCOSE, CAPILLARY
Glucose-Capillary: 106 mg/dL — ABNORMAL HIGH (ref 70–99)
Glucose-Capillary: 112 mg/dL — ABNORMAL HIGH (ref 70–99)
Glucose-Capillary: 114 mg/dL — ABNORMAL HIGH (ref 70–99)
Glucose-Capillary: 114 mg/dL — ABNORMAL HIGH (ref 70–99)
Glucose-Capillary: 152 mg/dL — ABNORMAL HIGH (ref 70–99)
Glucose-Capillary: 180 mg/dL — ABNORMAL HIGH (ref 70–99)
Glucose-Capillary: 185 mg/dL — ABNORMAL HIGH (ref 70–99)
Glucose-Capillary: 218 mg/dL — ABNORMAL HIGH (ref 70–99)
Glucose-Capillary: 226 mg/dL — ABNORMAL HIGH (ref 70–99)
Glucose-Capillary: 261 mg/dL — ABNORMAL HIGH (ref 70–99)

## 2020-08-20 LAB — C-REACTIVE PROTEIN: CRP: 17 mg/dL — ABNORMAL HIGH (ref <1.0)

## 2020-08-20 LAB — COMPREHENSIVE METABOLIC PANEL
ALT: 14 U/L (ref 0–44)
AST: 19 U/L (ref 15–41)
Albumin: 3 g/dL — ABNORMAL LOW (ref 3.5–5.0)
Alkaline Phosphatase: 51 U/L (ref 38–126)
Anion gap: 14 (ref 5–15)
BUN: 100 mg/dL — ABNORMAL HIGH (ref 8–23)
CO2: 16 mmol/L — ABNORMAL LOW (ref 22–32)
Calcium: 10.1 mg/dL (ref 8.9–10.3)
Chloride: 108 mmol/L (ref 98–111)
Creatinine, Ser: 3.82 mg/dL — ABNORMAL HIGH (ref 0.44–1.00)
GFR, Estimated: 13 mL/min — ABNORMAL LOW (ref >60)
Glucose, Bld: 281 mg/dL — ABNORMAL HIGH (ref 70–99)
Potassium: 5.7 mmol/L — ABNORMAL HIGH (ref 3.5–5.1)
Sodium: 138 mmol/L (ref 135–145)
Total Bilirubin: 0.5 mg/dL (ref 0.3–1.2)
Total Protein: 7 g/dL (ref 6.5–8.1)

## 2020-08-20 LAB — BASIC METABOLIC PANEL
Anion gap: 14 (ref 5–15)
BUN: 102 mg/dL — ABNORMAL HIGH (ref 8–23)
CO2: 17 mmol/L — ABNORMAL LOW (ref 22–32)
Calcium: 9.8 mg/dL (ref 8.9–10.3)
Chloride: 106 mmol/L (ref 98–111)
Creatinine, Ser: 3.43 mg/dL — ABNORMAL HIGH (ref 0.44–1.00)
GFR, Estimated: 14 mL/min — ABNORMAL LOW (ref >60)
Glucose, Bld: 160 mg/dL — ABNORMAL HIGH (ref 70–99)
Potassium: 5.3 mmol/L — ABNORMAL HIGH (ref 3.5–5.1)
Sodium: 137 mmol/L (ref 135–145)

## 2020-08-20 LAB — LEGIONELLA PNEUMOPHILA SEROGP 1 UR AG: L. pneumophila Serogp 1 Ur Ag: NEGATIVE

## 2020-08-20 LAB — MRSA PCR SCREENING: MRSA by PCR: NEGATIVE

## 2020-08-20 MED ORDER — INSULIN REGULAR(HUMAN) IN NACL 100-0.9 UT/100ML-% IV SOLN
INTRAVENOUS | Status: DC
  Administered 2020-08-20: 8 [IU]/h via INTRAVENOUS
  Filled 2020-08-20 (×2): qty 100

## 2020-08-20 MED ORDER — STERILE WATER FOR INJECTION IV SOLN
INTRAVENOUS | Status: DC
  Filled 2020-08-20 (×2): qty 850

## 2020-08-20 MED ORDER — INSULIN DETEMIR 100 UNIT/ML ~~LOC~~ SOLN
20.0000 [IU] | Freq: Two times a day (BID) | SUBCUTANEOUS | Status: DC
  Administered 2020-08-20 – 2020-08-22 (×5): 20 [IU] via SUBCUTANEOUS
  Filled 2020-08-20 (×7): qty 0.2

## 2020-08-20 MED ORDER — SODIUM ZIRCONIUM CYCLOSILICATE 10 G PO PACK
10.0000 g | PACK | Freq: Two times a day (BID) | ORAL | Status: DC
  Administered 2020-08-20 – 2020-08-22 (×3): 10 g via ORAL
  Filled 2020-08-20 (×5): qty 1

## 2020-08-20 MED ORDER — SODIUM ZIRCONIUM CYCLOSILICATE 10 G PO PACK
10.0000 g | PACK | Freq: Once | ORAL | Status: AC
  Administered 2020-08-20: 10 g via ORAL
  Filled 2020-08-20: qty 1

## 2020-08-20 MED ORDER — CEFTRIAXONE SODIUM 1 G IJ SOLR
1.0000 g | INTRAMUSCULAR | Status: AC
  Administered 2020-08-20 – 2020-08-26 (×8): 1 g via INTRAVENOUS
  Filled 2020-08-20: qty 1
  Filled 2020-08-20 (×9): qty 10

## 2020-08-20 MED ORDER — SODIUM BICARBONATE 650 MG PO TABS
650.0000 mg | ORAL_TABLET | Freq: Three times a day (TID) | ORAL | Status: DC
  Administered 2020-08-20 – 2020-09-04 (×44): 650 mg via ORAL
  Filled 2020-08-20 (×44): qty 1

## 2020-08-20 MED ORDER — CHLORHEXIDINE GLUCONATE CLOTH 2 % EX PADS
6.0000 | MEDICATED_PAD | Freq: Every day | CUTANEOUS | Status: DC
  Administered 2020-08-20 – 2020-08-27 (×8): 6 via TOPICAL

## 2020-08-20 MED ORDER — GUAIFENESIN 100 MG/5ML PO SOLN
5.0000 mL | ORAL | Status: DC | PRN
  Administered 2020-08-20 – 2020-09-19 (×6): 100 mg via ORAL
  Filled 2020-08-20 (×8): qty 5

## 2020-08-20 MED ORDER — SODIUM ZIRCONIUM CYCLOSILICATE 10 G PO PACK
10.0000 g | PACK | Freq: Two times a day (BID) | ORAL | Status: DC
  Filled 2020-08-20: qty 1

## 2020-08-20 MED ORDER — ORAL CARE MOUTH RINSE
15.0000 mL | Freq: Two times a day (BID) | OROMUCOSAL | Status: DC
  Administered 2020-08-20 – 2020-10-13 (×101): 15 mL via OROMUCOSAL

## 2020-08-20 MED ORDER — DEXTROSE 50 % IV SOLN: 0.0000 mL | INTRAVENOUS | Status: DC | PRN

## 2020-08-20 MED ORDER — HYDRALAZINE HCL 20 MG/ML IJ SOLN
10.0000 mg | INTRAMUSCULAR | Status: DC | PRN
  Administered 2020-08-20 – 2020-08-21 (×3): 10 mg via INTRAVENOUS
  Filled 2020-08-20 (×2): qty 1

## 2020-08-20 MED ORDER — INSULIN ASPART 100 UNIT/ML ~~LOC~~ SOLN
0.0000 [IU] | Freq: Three times a day (TID) | SUBCUTANEOUS | Status: DC
  Administered 2020-08-20 (×2): 3 [IU] via SUBCUTANEOUS
  Administered 2020-08-21: 2 [IU] via SUBCUTANEOUS
  Administered 2020-08-21 (×2): 3 [IU] via SUBCUTANEOUS
  Administered 2020-08-23 (×2): 8 [IU] via SUBCUTANEOUS
  Administered 2020-08-23 – 2020-08-25 (×3): 5 [IU] via SUBCUTANEOUS
  Administered 2020-08-25: 3 [IU] via SUBCUTANEOUS
  Administered 2020-08-25: 5 [IU] via SUBCUTANEOUS
  Administered 2020-08-26: 11 [IU] via SUBCUTANEOUS
  Administered 2020-08-26: 8 [IU] via SUBCUTANEOUS
  Administered 2020-08-26: 11 [IU] via SUBCUTANEOUS
  Administered 2020-08-27 (×2): 5 [IU] via SUBCUTANEOUS
  Administered 2020-08-27 – 2020-08-28 (×2): 11 [IU] via SUBCUTANEOUS
  Administered 2020-08-28: 15 [IU] via SUBCUTANEOUS
  Administered 2020-08-28: 3 [IU] via SUBCUTANEOUS
  Administered 2020-08-29 (×2): 5 [IU] via SUBCUTANEOUS
  Administered 2020-08-29: 2 [IU] via SUBCUTANEOUS
  Administered 2020-08-30 (×2): 3 [IU] via SUBCUTANEOUS
  Administered 2020-08-30: 5 [IU] via SUBCUTANEOUS
  Administered 2020-08-31: 3 [IU] via SUBCUTANEOUS
  Administered 2020-08-31: 8 [IU] via SUBCUTANEOUS
  Administered 2020-08-31 – 2020-09-01 (×2): 5 [IU] via SUBCUTANEOUS
  Administered 2020-09-01: 11 [IU] via SUBCUTANEOUS
  Administered 2020-09-02: 8 [IU] via SUBCUTANEOUS
  Administered 2020-09-02: 2 [IU] via SUBCUTANEOUS
  Administered 2020-09-03: 3 [IU] via SUBCUTANEOUS
  Administered 2020-09-03: 8 [IU] via SUBCUTANEOUS
  Administered 2020-09-04: 2 [IU] via SUBCUTANEOUS
  Administered 2020-09-04 – 2020-09-05 (×2): 3 [IU] via SUBCUTANEOUS
  Administered 2020-09-05: 5 [IU] via SUBCUTANEOUS
  Administered 2020-09-06 (×3): 3 [IU] via SUBCUTANEOUS
  Administered 2020-09-07: 5 [IU] via SUBCUTANEOUS
  Administered 2020-09-07 (×2): 3 [IU] via SUBCUTANEOUS
  Administered 2020-09-08 (×2): 5 [IU] via SUBCUTANEOUS
  Administered 2020-09-08: 11 [IU] via SUBCUTANEOUS
  Administered 2020-09-09: 2 [IU] via SUBCUTANEOUS
  Administered 2020-09-09: 3 [IU] via SUBCUTANEOUS
  Administered 2020-09-09: 2 [IU] via SUBCUTANEOUS
  Administered 2020-09-10: 3 [IU] via SUBCUTANEOUS
  Administered 2020-09-10: 11 [IU] via SUBCUTANEOUS
  Administered 2020-09-11: 2 [IU] via SUBCUTANEOUS
  Administered 2020-09-11: 8 [IU] via SUBCUTANEOUS
  Administered 2020-09-11: 5 [IU] via SUBCUTANEOUS
  Administered 2020-09-12: 8 [IU] via SUBCUTANEOUS
  Administered 2020-09-12: 5 [IU] via SUBCUTANEOUS
  Administered 2020-09-13: 8 [IU] via SUBCUTANEOUS
  Administered 2020-09-13: 5 [IU] via SUBCUTANEOUS
  Administered 2020-09-14: 3 [IU] via SUBCUTANEOUS
  Administered 2020-09-14: 2 [IU] via SUBCUTANEOUS
  Administered 2020-09-14: 3 [IU] via SUBCUTANEOUS
  Administered 2020-09-15 (×2): 5 [IU] via SUBCUTANEOUS
  Administered 2020-09-16 (×2): 3 [IU] via SUBCUTANEOUS
  Administered 2020-09-16: 8 [IU] via SUBCUTANEOUS
  Administered 2020-09-17: 15 [IU] via SUBCUTANEOUS
  Administered 2020-09-17: 3 [IU] via SUBCUTANEOUS
  Administered 2020-09-18 – 2020-09-19 (×4): 5 [IU] via SUBCUTANEOUS
  Administered 2020-09-20 (×2): 3 [IU] via SUBCUTANEOUS
  Administered 2020-09-20: 2 [IU] via SUBCUTANEOUS
  Administered 2020-09-21: 8 [IU] via SUBCUTANEOUS
  Administered 2020-09-21: 3 [IU] via SUBCUTANEOUS
  Administered 2020-09-21: 2 [IU] via SUBCUTANEOUS
  Administered 2020-09-22: 3 [IU] via SUBCUTANEOUS
  Administered 2020-09-22: 5 [IU] via SUBCUTANEOUS

## 2020-08-20 NOTE — Progress Notes (Signed)
[facility] Progress Note Patient Name: [name] [name] DOB: 07-06-1956 MRN: [id number]   Date of Service  08/20/2020  HPI/Events of Note  Hyperglycemia - Blood glucose = 261.   eICU Interventions  Plan: 1. Restart Insulin IV infusion. 2. D/C Levemir and Novolog SSI.     Intervention Category Major Interventions: Hyperglycemia - active titration of insulin therapy  Bilal Manzer [place] 08/20/2020, 1:20 AM

## 2020-08-20 NOTE — Progress Notes (Signed)
Admit: 08/18/2020 LOS: 1   [name] [name] is a 65 yo F with history of renal transplant x2 (latest in 2016) on myfortic, prograf, and chronic steroids, T2DM, hypothyroidism, and anemia who presented for progressively worsening SHOB, cough, chest pain, and generalized body aches after a recent positive COVID test (on 1/5).  Nephrology consulted due to worsening renal function along with metabolic acidosis and hyperkalemia in the setting of kidney transplant history. As per patient, she follows Dr. Arty Baumgartner as an outpatient.    Subjective:   Overnight: Patient became hyperkalemic to 5.7, given lokelma. Repeat BMP still pending. Also became hyperglycemic to 260s (steroid therapy likely contributing to this), so transitioned back to insulin gtt.  Evaluated patient at bedside. She was awake, alert, and eating breakfast. Her mentation is much improved from yesterday, which is reassuring.  Discussed with patient about what is likely causing her worsened renal function. Also discussed our plan going forward. She agrees with plan and all questions and concerns were addressed.   01/11 0701 - 01/12 0700 In: 2690.6 [I.V.:2690.6] Out: 460 [Urine:460]  Filed Weights   08/19/20 0017 08/20/20 0405  Weight: 92.5 kg 88.5 kg    Scheduled Meds: . amLODipine  5 mg Oral Daily  . vitamin C  500 mg Oral Daily  . aspirin EC  81 mg Oral Daily  . Chlorhexidine Gluconate Cloth  6 each Topical Daily  . dexamethasone (DECADRON) injection  6 mg Intravenous Q24H  . fluticasone  1 spray Each Nare Daily  . heparin  5,000 Units Subcutaneous Q8H  . levothyroxine  75 mcg Oral Q0600  . mouth rinse  15 mL Mouth Rinse BID  . metoprolol tartrate  75 mg Oral BID  . pantoprazole  40 mg Oral Daily  . simvastatin  10 mg Oral q1800  . tacrolimus  3 mg Oral QHS  . tacrolimus  4 mg Oral Daily  . zinc sulfate  220 mg Oral Daily   Continuous Infusions: . insulin 4.6 mL/hr at 08/20/20 0500  . remdesivir 100 mg  in NS 100 mL 100 mg (08/20/20 0934)  . sodium chloride 0.45 % 1,000 mL with sodium bicarbonate 75 mEq infusion 100 mL/hr at 08/20/20 0744   PRN Meds:.acetaminophen **OR** acetaminophen, dextrose, docusate sodium, labetalol, polyethylene glycol   Physical Exam:  Blood pressure (!) 167/54, pulse 68, temperature 98.3 F (36.8 C), resp. rate (!) 26, height 5\' 2"  (1.575 m), weight 88.5 kg, SpO2 92 %. GEN: Obese elderly female sitting in bed eating breakfast, NAD. ENT: moist mucous membranes EYES: PERRL, EOMI, no scleral icterus CV: normal rate and regular rhythm, no m/r/g PULM: mild expiratory wheezing noted b/l, still having some labored breathing but no accessory muscle usage noted.  ABD: soft, nontender, nondistended. Normoactive bowel sounds. SKIN: fistula in right arm. No rashes or jaundice. EXT: warm and well perfused. No edema noted.    Recent Labs  Lab 08/19/20 1019 08/19/20 1336 08/20/20 0135  NA 135 136 138  K 5.4* 4.9 5.7*  CL 109 105 108  CO2 13* 18* 16*  GLUCOSE 398* 141* 281*  BUN 93* 89* 100*  CREATININE 3.84* 3.62* 3.82*  CALCIUM 10.0 9.3 10.1  PHOS 4.1  --   --    Recent Labs  Lab 08/19/20 0216 08/19/20 0808 08/19/20 1019 08/20/20 0135  WBC 5.8  --  4.1 6.1  NEUTROABS  --   --   --  5.5  HGB 11.4* 10.9* 11.2* 11.1*  HCT 33.0* 32.0* 35.1* 33.4*  MCV 74.2*  --  76.5* 73.9*  PLT 94*  --  91* 96*    Assessment:  [name] [name] is a 65 yo F with hx of DDRT x2 (latest 2016) on prograf, myfortic, and low-dose prednisone, T2DM, hypothyroidism, and recent diagnosis of COVID-19 presenting with acute hypoxic respiratory failure 2/2 COVID PNA. Also found to be in DKA with worsening AoCKD and hyperkalemia on labs.  1. AKI on CKD 2. Hx of DDRT x2 (latest 2016). KDPI 90%, PRA 24%. Course complicated by acute pyelonephritis in 2016. 3. Chronic immunosuppression (myfortic, prograf, low-dose steroids) 4. Acute hypoxic respiratory failure 2/2 COVID-19 PNA 5. DKA, hx  of T2DM 6. Hyperkalemia 7. Anion Gap Metabolic Acidosis  8. HTN 9. Hypothyroidism  Plan: -continue holding mycophenolate -restarted tacrolimus at lower dose today, still awaiting tacrolimus trough level (target FK goal 5-7) -continue IV steroids -renal U/S showing no hydronephrosis or blockages/clots -urine culture pending -trending renal indices, UOP, strict I/O's -daily BMP -If no significant improvement in kidney function, may need to consider transferring to her transplant center to expedite her work up -at this time, no indication for renal replacement therapy -still on bicarb drip; will obtain VBG to recheck pH -DKA management per primary service w/serial labs -AHRF and COVID PNA management per PCCM   Virl Axe, MD PGY-1 08/20/2020, 9:59 AM

## 2020-08-20 NOTE — Progress Notes (Signed)
[facility] Progress Note Patient Name: [name] [name] DOB: 1956-07-23 MRN: [id number]   Date of Service  08/20/2020  HPI/Events of Note  Hyperkalemia - K+ = 5.7.   eICU Interventions  Plan: 1. Lokelma 10 gm PO now.  2. Repeat BMP at 9 AM.     Intervention Category Major Interventions: Electrolyte abnormality - evaluation and management  [name] 08/20/2020, 3:03 AM

## 2020-08-20 NOTE — Progress Notes (Addendum)
Inpatient Diabetes Program Recommendations  AACE/ADA: New Consensus Statement on Inpatient Glycemic Control (2015)  Target Ranges:  Prepandial:   less than 140 mg/dL      Peak postprandial:   less than 180 mg/dL (1-2 hours)      Critically ill patients:  140 - 180 mg/dL   Lab Results  Component Value Date   GLUCAP 180 (H) 08/20/2020   HGBA1C 8.2 (H) 08/19/2020    Review of Glycemic Control Results for [name] [name], [name] [name] (MRN [id number]) as of 08/20/2020 11:55  Ref. Range 08/20/2020 04:03 08/20/2020 05:32 08/20/2020 06:17 08/20/2020 07:42 08/20/2020 09:13  Glucose-Capillary Latest Ref Range: 70 - 99 mg/dL 185 (H) 114 (H) 114 (H) 112 (H) 106 (H)   Diabetes history:  DM Outpatient Diabetes medications:  Levemir 30 units q AM and Levemir 35 units q HS Novolog 4 units tid with meals Current orders for Inpatient glycemic control:  Novolog moderate tid with meals  Inpatient Diabetes Program Recommendations:    Please add Levemir 20 units bid.  Also consider adding Novolog meal coverage 4 units tid with meals (hold if patient eats less than 50% or NPO).   Thank s Adah Perl, RN, BC-ADM Inpatient Diabetes Coordinator Pager [phone] Per RN, patient is eating very little.  Consider q 4 hour correction since patient not eating (sensitive)

## 2020-08-20 NOTE — Progress Notes (Signed)
NAME:  [name] [name], MRN:  [id number], DOB:  12-Jun-1956, LOS: 1 ADMISSION DATE:  08/18/2020, CONSULTATION DATE:  08/18/2020 REFERRING MD:  Jamse Arn - TRH, CHIEF COMPLAINT: Hypoxic respiratory failure  HPI/course in hospital  Limited history as patient is in respiratory distress and somnolent.  Per the chart patient developed symptoms of chills cough fatigue on 12/29 and tested positive 1/5.  She is fully vaccinated with her booster in November.  Positive family contacts.  Initially had nausea and diarrhea which have both subsided.  Was referred for monoclonal antibody but was deemed too sick for outpatient treatment and emergency department referral was recommended at that time.  Brought in yesterday evening to emergency department via EMS for worsening chest pain shortness of breath and generalized weakness.  Initially oxygen saturations 96% on 2 L nasal cannula.  Admitted to hospitalist service.  Found to have multifocal pneumonia on chest x-ray compatible with COVID-19 pneumonia but also worsening renal function with significant metabolic acidosis.  Worsening tachypnea this morning with escalation in oxygen requirements.  Started on steroids and remdesivir.   Past Medical History   Past Medical History:  Diagnosis Date  . Blood transfusion   . Chronic kidney disease    s/p transplant, kidney wasn't removed  . Gout due to renal impairment 2010   "before finding out I had kidney failure"  . History of renal transplant   . Hyperlipidemia   . Hypertension   . Sickle cell trait ([place])   . Thyroid disease   . Type II diabetes mellitus ([place])   . UTI (lower urinary tract infection) 12/08/11   "first one ever"      Interim history/subjective:  On 40L HFNC, vitals stable. She is comfortable, eating breakfast.  Objective   Blood pressure (!) 151/57, pulse 68, temperature 98.2 F (36.8 C), temperature source Axillary, resp. rate (!) 26, height 5\' 2"  (1.575 m), weight 88.5  kg, SpO2 91 %.    FiO2 (%):  [100 %] 100 %   Intake/Output Summary (Last 24 hours) at 08/20/2020 0827 Last data filed at 08/20/2020 0500 Gross per 24 hour  Intake 2690.57 ml  Output 360 ml  Net 2330.57 ml   Filed Weights   08/19/20 0017 08/20/20 0405  Weight: 92.5 kg 88.5 kg    Examination: General: Adult female, eating breakfast, in NAD. Neuro: A&O x 3, no deficits. HEENT: [place]/AT. Sclerae anicteric. EOMI. Cardiovascular: RRR, no M/R/G.  Lungs: Respirations even and unlabored.  Coarse. Abdomen: BS x 4, soft, NT/ND.  Musculoskeletal: No gross deformities, no edema.  Skin: Intact, warm, no rashes.   Assessment & Plan:   Critically ill due to acute hypoxic respiratory failure secondary to COVID-19 pneumonia requiring heated high flow nasal cannula. -Titrate heated high flow to keep saturations greater than 88% and respiratory rate less than 30. -Trial of BiPAP if necessary -Continue decadron x 10 days, remdesivir x 5 days  Critically ill due to acute kidney injury with severe metabolic acidosis and hyperkalemia.  Severely oliguric at this time.  S/p bicarb, insulin, lokelma. Likely combination of hypovolemia and tacrolimus related kidney failure Hx renal transplant x 2 (last 2016) - on mycophenolate, prograf, steroids. -Continue bicarbonate infusion for now -Renal following, will likely require iHD vs CRRT if no improvement - Continue Tacrolimus at 1/2 home dose (levels pending). - Holding Mycophenolate - Repeat BMP pending  Critically ill due to DKA - glucose improved on insulin gtt but last labs still show persistent acidosis. - Continue insulin gtt  per DKA protocol for now, likely transition off by AM 1/13 (unless this mornings BMP shows improvement - still pending).  Hx type 2 diabetes mellitus on insulin -DKA protocol for now - Resume home regimen once stabilized  Hypertension -Continue Amlodipine, Lopressor -As needed labetalol -Resume oral antihypertensives as  respiratory status allows  Hypothyroidism -Continue home dose levothyroxine.  Daily Goals Checklist  Pain/Anxiety/Delirium protocol (if indicated): None required VAP protocol (if indicated): Not intubated Respiratory support goals: Heated high flow to keep saturations greater than 88%, BiPAP as needed Blood pressure target: Keep systolic less than 338 DVT prophylaxis: Heparin 3 times daily Nutritional status and feeding goals: Moderate nutritional risk.  Encourage oral intake GI prophylaxis: not indicated.  Fluid status goals: Continue bicarbonate infusion Urinary catheter: Assessment of intravascular volume Central lines:  PIV only at this time. Glucose control: IV insulin Mobility/therapy needs: BR privileges Antibiotic de-escalation: remdesivir and steroids.  Home medication reconciliation: anti-rejection medications ordered Daily labs: BMP every 4h for now Code Status: Full code  Family Communication: will update family Disposition: ICU  Critical care time: 40 min.   Montey Hora, [facility] For pager details, please see AMION 08/20/2020, 8:49 AM

## 2020-08-21 DIAGNOSIS — J9601 Acute respiratory failure with hypoxia: Secondary | ICD-10-CM | POA: Diagnosis not present

## 2020-08-21 DIAGNOSIS — U071 COVID-19: Secondary | ICD-10-CM | POA: Diagnosis not present

## 2020-08-21 LAB — COMPREHENSIVE METABOLIC PANEL
ALT: 11 U/L (ref 0–44)
AST: 21 U/L (ref 15–41)
Albumin: 2.8 g/dL — ABNORMAL LOW (ref 3.5–5.0)
Alkaline Phosphatase: 49 U/L (ref 38–126)
Anion gap: 13 (ref 5–15)
BUN: 97 mg/dL — ABNORMAL HIGH (ref 8–23)
CO2: 22 mmol/L (ref 22–32)
Calcium: 9.5 mg/dL (ref 8.9–10.3)
Chloride: 105 mmol/L (ref 98–111)
Creatinine, Ser: 3.05 mg/dL — ABNORMAL HIGH (ref 0.44–1.00)
GFR, Estimated: 16 mL/min — ABNORMAL LOW (ref >60)
Glucose, Bld: 220 mg/dL — ABNORMAL HIGH (ref 70–99)
Potassium: 4.8 mmol/L (ref 3.5–5.1)
Sodium: 140 mmol/L (ref 135–145)
Total Bilirubin: 0.7 mg/dL (ref 0.3–1.2)
Total Protein: 6.6 g/dL (ref 6.5–8.1)

## 2020-08-21 LAB — CBC WITH DIFFERENTIAL/PLATELET
Abs Immature Granulocytes: 0.09 10*3/uL — ABNORMAL HIGH (ref 0.00–0.07)
Basophils Absolute: 0 10*3/uL (ref 0.0–0.1)
Basophils Relative: 0 %
Eosinophils Absolute: 0 10*3/uL (ref 0.0–0.5)
Eosinophils Relative: 0 %
HCT: 35.2 % — ABNORMAL LOW (ref 36.0–46.0)
Hemoglobin: 11.6 g/dL — ABNORMAL LOW (ref 12.0–15.0)
Immature Granulocytes: 1 %
Lymphocytes Relative: 4 %
Lymphs Abs: 0.4 10*3/uL — ABNORMAL LOW (ref 0.7–4.0)
MCH: 24.6 pg — ABNORMAL LOW (ref 26.0–34.0)
MCHC: 33 g/dL (ref 30.0–36.0)
MCV: 74.7 fL — ABNORMAL LOW (ref 80.0–100.0)
Monocytes Absolute: 0.5 10*3/uL (ref 0.1–1.0)
Monocytes Relative: 5 %
Neutro Abs: 8.4 10*3/uL — ABNORMAL HIGH (ref 1.7–7.7)
Neutrophils Relative %: 90 %
Platelets: 121 10*3/uL — ABNORMAL LOW (ref 150–400)
RBC: 4.71 MIL/uL (ref 3.87–5.11)
RDW: 14.4 % (ref 11.5–15.5)
WBC: 9.4 10*3/uL (ref 4.0–10.5)
nRBC: 0 % (ref 0.0–0.2)

## 2020-08-21 LAB — GLUCOSE, CAPILLARY
Glucose-Capillary: 199 mg/dL — ABNORMAL HIGH (ref 70–99)
Glucose-Capillary: 196 mg/dL — ABNORMAL HIGH (ref 70–99)
Glucose-Capillary: 152 mg/dL — ABNORMAL HIGH (ref 70–99)
Glucose-Capillary: 123 mg/dL — ABNORMAL HIGH (ref 70–99)
Glucose-Capillary: 92 mg/dL (ref 70–99)
Glucose-Capillary: 190 mg/dL — ABNORMAL HIGH (ref 70–99)

## 2020-08-21 LAB — URINE CULTURE: Culture: >=100000 — AB

## 2020-08-21 LAB — D-DIMER, QUANTITATIVE: D-Dimer, Quant: 1.31 ug/mL-FEU — ABNORMAL HIGH (ref 0.00–0.50)

## 2020-08-21 LAB — C-REACTIVE PROTEIN: CRP: 10.5 mg/dL — ABNORMAL HIGH (ref <1.0)

## 2020-08-21 LAB — TACROLIMUS LEVEL: Tacrolimus (FK506) - LabCorp: 9.4 ng/mL (ref 2.0–20.0)

## 2020-08-21 MED ORDER — GERHARDT'S BUTT CREAM
TOPICAL_CREAM | Freq: Every day | CUTANEOUS | Status: AC
  Filled 2020-08-21 (×2): qty 1

## 2020-08-21 MED ORDER — SODIUM CHLORIDE 0.9 % IV SOLN: INTRAVENOUS | Status: DC | PRN

## 2020-08-21 MED ORDER — FENTANYL CITRATE (PF) 100 MCG/2ML IJ SOLN
25.0000 ug | Freq: Once | INTRAMUSCULAR | Status: AC
  Administered 2020-08-21: 25 ug via INTRAVENOUS
  Filled 2020-08-21: qty 2

## 2020-08-21 MED ORDER — INSULIN ASPART 100 UNIT/ML ~~LOC~~ SOLN
4.0000 [IU] | Freq: Three times a day (TID) | SUBCUTANEOUS | Status: DC
  Administered 2020-08-23 – 2020-08-27 (×11): 4 [IU] via SUBCUTANEOUS

## 2020-08-21 NOTE — Progress Notes (Addendum)
Admit: 08/18/2020 LOS: 2   [name] [name] is a 65 yo F with history of renal transplant x2 (latest in 2016) on myfortic, prograf, and chronic steroids, T2DM, hypothyroidism, and anemia who presented for progressively worsening SHOB, cough, chest pain, and generalized body aches after a recent positive COVID test (on 1/5).  Nephrology consulted due to worsening renal function along with metabolic acidosis and hyperkalemia in the setting of kidney transplant history. As per patient, she follows Dr. Arty Baumgartner as an outpatient.    Subjective:   Overnight: Urine cultures showing klebsiella pneumoniae UTI, started on rocephin for coverage.  Evaluated patient at bedside. Reports feeling about the same as yesterday. States she feels her breathing is a little improved compared to yesterday, however it is labored after speaking.  Discussed improvement in renal function, although it still has not reached her presumed baseline. She has foley catheter in place, produced about 1.3L UOP over past 24 hours.  All questions and concerns addressed.   01/12 0701 - 01/13 0700 In: 2958.3 [P.O.:200; I.V.:1829.5; IV Piggyback:918.8] Out: 1315 [Urine:1215; Stool:100]  Filed Weights   08/19/20 0017 08/20/20 0405 08/21/20 0453  Weight: 92.5 kg 88.5 kg 88.6 kg    Scheduled Meds: . amLODipine  5 mg Oral Daily  . vitamin C  500 mg Oral Daily  . aspirin EC  81 mg Oral Daily  . Chlorhexidine Gluconate Cloth  6 each Topical Daily  . dexamethasone (DECADRON) injection  6 mg Intravenous Q24H  . fluticasone  1 spray Each Nare Daily  . Gerhardt's butt cream   Topical Daily  . heparin  5,000 Units Subcutaneous Q8H  . insulin aspart  0-15 Units Subcutaneous TID WC  . insulin aspart  4 Units Subcutaneous TID WC  . insulin detemir  20 Units Subcutaneous BID  . levothyroxine  75 mcg Oral Q0600  . mouth rinse  15 mL Mouth Rinse BID  . metoprolol tartrate  75 mg Oral BID  . pantoprazole  40 mg Oral Daily   . simvastatin  10 mg Oral q1800  . sodium bicarbonate  650 mg Oral TID  . sodium zirconium cyclosilicate  10 g Oral BID  . tacrolimus  3 mg Oral QHS  . tacrolimus  4 mg Oral Daily  . zinc sulfate  220 mg Oral Daily   Continuous Infusions: . cefTRIAXone (ROCEPHIN)  IV 200 mL/hr at 08/21/20 0800  . remdesivir 100 mg in NS 100 mL Stopped (08/20/20 1008)  .  sodium bicarbonate (isotonic) infusion in sterile water 75 mL/hr at 08/21/20 0800   PRN Meds:.acetaminophen **OR** acetaminophen, docusate sodium, guaiFENesin, hydrALAZINE, labetalol, polyethylene glycol   Physical Exam:  Blood pressure (!) 158/69, pulse 68, temperature 97.6 F (36.4 C), temperature source Axillary, resp. rate (!) 25, height 5\' 2"  (1.575 m), weight 88.6 kg, SpO2 91 %. GEN: Obese elderly female, lying in bed, on HHFNC, NAD. ENT: moist mucous membranes EYES: PERRL, EOMI CV: normal rate and regular rhythm, no m/r/g PULM: mild expiratory wheezing b/l ABD: soft, nontender, nondistended, +BS GU: foley catheter in place SKIN: fistula in right arm EXT: warm and well perfused, no edema.    Recent Labs  Lab 08/19/20 1019 08/19/20 1336 08/20/20 0135 08/20/20 1101 08/21/20 0034  NA 135   < > 138 137 140  K 5.4*   < > 5.7* 5.3* 4.8  CL 109   < > 108 106 105  CO2 13*   < > 16* 17* 22  GLUCOSE 398*   < >  281* 160* 220*  BUN 93*   < > 100* 102* 97*  CREATININE 3.84*   < > 3.82* 3.43* 3.05*  CALCIUM 10.0   < > 10.1 9.8 9.5  PHOS 4.1  --   --   --   --    < > = values in this interval not displayed.   Recent Labs  Lab 08/19/20 1019 08/20/20 0135 08/21/20 0118  WBC 4.1 6.1 9.4  NEUTROABS  --  5.5 8.4*  HGB 11.2* 11.1* 11.6*  HCT 35.1* 33.4* 35.2*  MCV 76.5* 73.9* 74.7*  PLT 91* 96* 121*    Assessment:  [name] [name] is a 65 yo F with hx of DDRT x2 (latest 2016) on prograf, myfortic, and low-dose prednisone, T2DM, hypothyroidism, and recent diagnosis of COVID-19 presenting with acute hypoxic respiratory  failure 2/2 COVID PNA. Also found to be in DKA with worsening AoCKD and hyperkalemia on labs.  1. AKI on CKD 2. Hx of DDRT x2 (latest 2016). KDPI 90%, PRA 24%. Course complicated by acute pyelonephritis in 2016. 3. Chronic immunosuppression (myfortic, prograf, low-dose steroids) 4. Acute hypoxic respiratory failure 2/2 COVID-19 PNA 5. Klebsiella pneumonia UTI 6. DKA, hx of T2DM 7. Hyperkalemia 8. Anion Gap Metabolic Acidosis  9. HTN 10. Hypothyroidism  Plan: -continue holding mycophenolate -continue tacrolimus at half dose -still awaiting tacrolimus trough level (sent on 1/10) -will need to continue IV steroids despite Klebsiella UTI as patient was on chronic prednisone at home for immunosuppression, and also due to current COVID PNA. Furthermore, mycophenolate is being held at this time, so IV steroids may provide benefit in terms of post-transplant immunosuppression. -continue rocephin for UTI coverage -trending renal indices, UOP, strict I/O's -- renal function improving; patient was likely volume depleted and oliguric but now appears to be producing good UOP after IV fluid therapy and foley cath placement -daily BMP -If no significant improvement in kidney function, may need to consider transferring to her transplant center to expedite her work up -at this time, no indication for renal replacement therapy or IHD as renal function is improving -VBG yesterday showing improvement in acidosis and electrolytes have normalized on CMP this AM, so stopped bicarb drip at this time -DKA management per primary service w/serial labs - off insulin gtt, now on levemir 20u BID and meal time SSI -AHRF and COVID PNA management per PCCM   Virl Axe, MD  [facility]  PGY-1 08/21/2020, 9:16 AM

## 2020-08-21 NOTE — Progress Notes (Signed)
[facility] Progress Note Patient Name: [name] [name] DOB: 06-17-1956 MRN: [id number]   Date of Service  08/21/2020  HPI/Events of Note  Patient c/o generalized pain. No relief with Tylenol. Allergy to Codiene complicates choice of treatment.   eICU Interventions  Plan: 1. Fentanyl 25 mcg IV X 1 now.      Intervention Category Major Interventions: Other:  Lysle Dingwall 08/21/2020, 3:53 AM

## 2020-08-21 NOTE — Progress Notes (Addendum)
NAME:  [name] [name], MRN:  [id number], DOB:  05/22/1956, LOS: 2 ADMISSION DATE:  08/18/2020, CONSULTATION DATE:  08/18/2020 REFERRING MD:  Jamse Arn - TRH, CHIEF COMPLAINT: Hypoxic respiratory failure  HPI/course in hospital  Limited history as patient is in respiratory distress and somnolent.  Per the chart patient developed symptoms of chills cough fatigue on 12/29 and tested positive 1/5.  She is fully vaccinated with her booster in November.  Positive family contacts.  Initially had nausea and diarrhea which have both subsided.  Was referred for monoclonal antibody but was deemed too sick for outpatient treatment and emergency department referral was recommended at that time.  Brought in yesterday evening to emergency department via EMS for worsening chest pain shortness of breath and generalized weakness.  Initially oxygen saturations 96% on 2 L nasal cannula.  Admitted to hospitalist service.  Found to have multifocal pneumonia on chest x-ray compatible with COVID-19 pneumonia but also worsening renal function with significant metabolic acidosis.  Nephrology consulted.   Worsening tachypnea 1/11 with escalation in oxygen requirements.  Started on steroids and remdesivir; tx to ICU.    Interval History:   1/12:   pt weak and minimally conversant but does not appear to be in any distress or tachypneic. On hhfnc 40L and states she thinks her breathing is improved.      Past Medical History   Past Medical History:  Diagnosis Date  . Blood transfusion   . Chronic kidney disease    s/p transplant, kidney wasn't removed  . Gout due to renal impairment 2010   "before finding out I had kidney failure"  . History of renal transplant   . Hyperlipidemia   . Hypertension   . Sickle cell trait ([place])   . Thyroid disease   . Type II diabetes mellitus ([place])   . UTI (lower urinary tract infection) 12/08/11   "first one ever"      Significant Hospital Events:  1/10 admitted  [facility] 1/11 ICU  Consults:  Nephrology   Procedures:   Significant Diagnostic Tests:  1/11 renal US >> Elevated resistive indices within the left lower quadrant transplant kidney.   No hydronephrosis.  Micro Data:  1/12 MRSA >> neg 1/10 BCx2 >> 1/11 UC >> klebsiella pna  Antimicrobials:  1/12 ceftriaxone >>  1/10 remdesivir; 1/12;  1/11 dexamethasone >>  Interim history/subjective:  On HHFNC 85%/ 35L Afebrile Improved UOP ~0.6 ml/kg/hr, sCr 3.43-> 3.05 No complaints except some soreness on her bottom, better with repositioning.  Feels like her breathing is better today   Objective   Blood pressure (!) 182/76, pulse 68, temperature 97.6 F (36.4 C), temperature source Axillary, resp. rate (!) 25, height 5\' 2"  (1.575 m), weight 88.6 kg, SpO2 91 %.    FiO2 (%):  [85 %-100 %] 85 %   Intake/Output Summary (Last 24 hours) at 08/21/2020 0751 Last data filed at 08/21/2020 0600 Gross per 24 hour  Intake 2958.29 ml  Output 1315 ml  Net 1643.29 ml   Filed Weights   08/19/20 0017 08/20/20 0405 08/21/20 0453  Weight: 92.5 kg 88.5 kg 88.6 kg    Examination: General: Adult female, eating breakfast, in NAD. Neuro: A&O x 3, no deficits. HEENT: [place]/AT. Sclerae anicteric. EOMI. Cardiovascular: RRR, no M/R/G.  Lungs: Respirations even and unlabored.  Coarse. Abdomen: BS x 4, soft, NT/ND.  Musculoskeletal: No gross deformities, no edema.  Skin: Intact, warm, no rashes.  General:  Adult female sitting upright in bed in NAD HEENT:  MM pink/moist, pupils 3/reactive, anicteric  Neuro: Awake, oriented x 3, slow responses, MAE, generalized weakness CV: rr, NSR, no murmur PULM:  Non labored on HHFNC, clear anteriorly, diminished in bases GI: obese, soft, bs + Extremities: warm/dry, no LE edema, AVF RUE Skin: no rashes   +1.6/ net +4.1 Weights stable 88.5-> 88.6  Assessment & Plan:   Critically ill due to acute hypoxic respiratory failure secondary to COVID-19 pneumonia requiring  heated high flow nasal cannula. - Currently in no distress and happy hypoxic with acceptable saturations on HHFNC   - Maintain O2 saturations for goal SpO2 > 88% - Monitor closely for signs of ventilatory failure - Self proning as tolerated - Trend inflammatory markers - Ongoing aggressive pulmonary hygiene, progress activity as tolerated, add PT today, encourage IS  - Continue decadron 6mg  daily for 10 days and remdesivir 5 days per pharmacy   Critically ill due to acute kidney injury with severe metabolic acidosis and hyperkalemia.  Severely oliguric at this time.  S/p bicarb, insulin, lokelma. Likely combination of hypovolemia and tacrolimus related kidney failure Hx renal transplant x 2 (last 2016) - on mycophenolate, prograf, steroids. - appreciate Nephrology assistance,  still may need iHD at some point this admit, remains hemodynamically stable otherwise and electrolytes improved - May be able to stop bicarb gtt today  - continue lokelma 10mg  BID - continue foley today  - improving UOP/ renal indices - continue half dose of home tacrolimus; tacrolimus level from 1/10 still pending - holding mycophenolate - trend renal indices / strict I/Os - [place] as above- elevated RI but no obstruction  DKA, possible - resolved Hx type 2 diabetes mellitus on insulin, exacerbated by steroids - resolved; off insulin gtt 1/12 - continue SSI  Moderate and levemir 20 units BID - will add meal coverage 4 units   Hypertension - Continue Amlodipine, Lopressor - prn labetalol   Hypothyroidism -Continue home levothyroxine.  Klebsiella UTI  - continue ceftriaxone  Daily Goals Checklist  Pain/Anxiety/Delirium protocol (if indicated): None required VAP protocol (if indicated): Not intubated Respiratory support goals: Heated high flow to keep saturations greater than 88%, BiPAP as needed DVT prophylaxis: Heparin 3 times daily Nutritional status and feeding goals: Moderate nutritional risk.   Encourage oral intake GI prophylaxis: not indicated.  Fluid status goals: Continue bicarbonate infusion Urinary catheter: Assessment of intravascular volume Central lines:  PIV only at this time. Glucose control: IV insulin Mobility/therapy needs: PT Antibiotic de-escalation: remdesivir and steroids.  Home medication reconciliation: anti-rejection medications ordered Daily labs: CBC/ BMET Code Status: Full code  Family Communication: pending family.  Patient updated at bedside.  Disposition: ICU  Critical care time: 35 min.     Kennieth Rad, ACNP [facility] 08/21/2020, 7:51 AM

## 2020-08-22 DIAGNOSIS — N179 Acute kidney failure, unspecified: Secondary | ICD-10-CM | POA: Diagnosis not present

## 2020-08-22 DIAGNOSIS — E1010 Type 1 diabetes mellitus with ketoacidosis without coma: Secondary | ICD-10-CM | POA: Diagnosis not present

## 2020-08-22 DIAGNOSIS — D849 Immunodeficiency, unspecified: Secondary | ICD-10-CM | POA: Diagnosis not present

## 2020-08-22 DIAGNOSIS — J9601 Acute respiratory failure with hypoxia: Secondary | ICD-10-CM | POA: Diagnosis not present

## 2020-08-22 DIAGNOSIS — L8990 Pressure ulcer of unspecified site, unspecified stage: Secondary | ICD-10-CM | POA: Diagnosis present

## 2020-08-22 LAB — CBC WITH DIFFERENTIAL/PLATELET
Abs Immature Granulocytes: 0.07 10*3/uL (ref 0.00–0.07)
Basophils Absolute: 0 10*3/uL (ref 0.0–0.1)
Basophils Relative: 0 %
Eosinophils Absolute: 0 10*3/uL (ref 0.0–0.5)
Eosinophils Relative: 0 %
HCT: 33.6 % — ABNORMAL LOW (ref 36.0–46.0)
Hemoglobin: 10.9 g/dL — ABNORMAL LOW (ref 12.0–15.0)
Immature Granulocytes: 1 %
Lymphocytes Relative: 4 %
Lymphs Abs: 0.3 10*3/uL — ABNORMAL LOW (ref 0.7–4.0)
MCH: 24.2 pg — ABNORMAL LOW (ref 26.0–34.0)
MCHC: 32.4 g/dL (ref 30.0–36.0)
MCV: 74.7 fL — ABNORMAL LOW (ref 80.0–100.0)
Monocytes Absolute: 0.5 10*3/uL (ref 0.1–1.0)
Monocytes Relative: 6 %
Neutro Abs: 6.7 10*3/uL (ref 1.7–7.7)
Neutrophils Relative %: 89 %
Platelets: 120 10*3/uL — ABNORMAL LOW (ref 150–400)
RBC: 4.5 MIL/uL (ref 3.87–5.11)
RDW: 14.5 % (ref 11.5–15.5)
WBC: 7.6 10*3/uL (ref 4.0–10.5)
nRBC: 0 % (ref 0.0–0.2)

## 2020-08-22 LAB — COMPREHENSIVE METABOLIC PANEL
ALT: 15 U/L (ref 0–44)
AST: 24 U/L (ref 15–41)
Albumin: 2.8 g/dL — ABNORMAL LOW (ref 3.5–5.0)
Alkaline Phosphatase: 57 U/L (ref 38–126)
Anion gap: 14 (ref 5–15)
BUN: 88 mg/dL — ABNORMAL HIGH (ref 8–23)
CO2: 25 mmol/L (ref 22–32)
Calcium: 9.7 mg/dL (ref 8.9–10.3)
Chloride: 106 mmol/L (ref 98–111)
Creatinine, Ser: 2.44 mg/dL — ABNORMAL HIGH (ref 0.44–1.00)
GFR, Estimated: 21 mL/min — ABNORMAL LOW (ref >60)
Glucose, Bld: 122 mg/dL — ABNORMAL HIGH (ref 70–99)
Potassium: 4.5 mmol/L (ref 3.5–5.1)
Sodium: 145 mmol/L (ref 135–145)
Total Bilirubin: 0.5 mg/dL (ref 0.3–1.2)
Total Protein: 6.6 g/dL (ref 6.5–8.1)

## 2020-08-22 LAB — GLUCOSE, CAPILLARY
Glucose-Capillary: 73 mg/dL (ref 70–99)
Glucose-Capillary: 71 mg/dL (ref 70–99)
Glucose-Capillary: 60 mg/dL — ABNORMAL LOW (ref 70–99)
Glucose-Capillary: 70 mg/dL (ref 70–99)
Glucose-Capillary: 186 mg/dL — ABNORMAL HIGH (ref 70–99)

## 2020-08-22 LAB — D-DIMER, QUANTITATIVE: D-Dimer, Quant: 1.11 ug/mL-FEU — ABNORMAL HIGH (ref 0.00–0.50)

## 2020-08-22 LAB — C-REACTIVE PROTEIN: CRP: 11.5 mg/dL — ABNORMAL HIGH (ref <1.0)

## 2020-08-22 MED ORDER — INSULIN DETEMIR 100 UNIT/ML ~~LOC~~ SOLN
15.0000 [IU] | Freq: Two times a day (BID) | SUBCUTANEOUS | Status: DC
  Administered 2020-08-22 – 2020-08-23 (×3): 15 [IU] via SUBCUTANEOUS
  Filled 2020-08-22 (×5): qty 0.15

## 2020-08-22 MED ORDER — METHYLPREDNISOLONE SODIUM SUCC 125 MG IJ SOLR
60.0000 mg | Freq: Two times a day (BID) | INTRAMUSCULAR | Status: DC
  Administered 2020-08-22 – 2020-08-29 (×15): 60 mg via INTRAVENOUS
  Filled 2020-08-22 (×15): qty 2

## 2020-08-22 MED ORDER — LOPERAMIDE HCL 2 MG PO CAPS
2.0000 mg | ORAL_CAPSULE | ORAL | Status: DC | PRN
  Administered 2020-08-24 – 2020-08-28 (×5): 2 mg via ORAL
  Filled 2020-08-22 (×5): qty 1

## 2020-08-22 NOTE — Progress Notes (Signed)
Hypoglycemic Event  CBG: 60  Treatment: 8 oz juice/soda  Symptoms: None  Follow-up CBG: Time:1800 CBG Result:70  Possible Reasons for Event: Unknown  Comments/MD notified: Dr. Waldron [facility] notified.    [name] [name]

## 2020-08-22 NOTE — Evaluation (Signed)
Physical Therapy Evaluation Patient Details Name: [name] [name] MRN: [id number] DOB: 12-31-1955 Today's Date: 08/22/2020   History of Present Illness  Pt is a 65 y.o. female who tested (+) COVID-19 on 08/13/20, now admitted 08/18/20 with worsening SOB, cough, chest pain and body aches. Workup for acute hypoxic respiratory failure due to COVID-19 PNA, DKA, AKI on CKD, UTI. PMH includes HTN, DM2, chronic steroid use, s/p renal transplant x2.    Clinical Impression  Pt presents with an overall decrease in functional mobility secondary to above. PTA, pt independent, lives alone, retired from work. Initiated education re: current condition, O2 needs, activity recommendations, therex, energy conservation, and importance of mobility. Today, pt able to initiate short bouts of seated and standing activity with RW and minA; pt limited by significant DOE and desaturation with minimal activity. SpO2 down to 79% on 20L O2 HHFNC at 100% FiO2; up to 87-92% with prolonged seated rest. Pt would benefit from continued acute PT services to maximize functional mobility and independence prior to d/c with SNF-level therapies; pt hopeful for d/c home, but willing to consider SNF if needed once medically stable for d/c.    Follow Up Recommendations SNF;Supervision/Assistance - 24 hour (vs. HH pending progress)    Equipment Recommendations   (TBD)    Recommendations for Other Services       Precautions / Restrictions Precautions Precautions: Fall;Other (comment) Precaution Comments: Watch SpO2 on HHFNC Restrictions Weight Bearing Restrictions: No      Mobility  Bed Mobility               General bed mobility comments: Received sitting in recliner    Transfers Overall transfer level: Needs assistance Equipment used: Rolling walker (2 wheeled) Transfers: Sit to/from Stand Sit to Stand: Min assist;Min guard         General transfer comment: Multiple sit<>stands from recliner to RW,  initial minA for stability, progressing to min guard on subsequent trials; cues for hand placement  Ambulation/Gait Ambulation/Gait assistance: Min guard Gait Distance (Feet): 4 Feet Assistive device: Rolling walker (2 wheeled)   Gait velocity: Decreased   General Gait Details: Slow, labored steps 2' forwards and 2' backwards with RW and min guard, limited by confines of HHFNC lines, pt also limited by fatigue, desaturation and DOE 4/4.  Stairs            Wheelchair Mobility    Modified Rankin (Stroke Patients Only)       Balance Overall balance assessment: Needs assistance   Sitting balance-Leahy Scale: Fair       Standing balance-Leahy Scale: Poor Standing balance comment: Reliant on UE support                             Pertinent Vitals/Pain Pain Assessment: No/denies pain    Home Living Family/patient expects to be discharged to:: Private residence Living Arrangements: Alone Available Help at Discharge: Family;Available PRN/intermittently Type of Home: House Home Access: Level entry     Home Layout: Two level;Bed/bath upstairs Home Equipment: None Additional Comments: 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 Husband passed away in 03-13-23. Daughters live nearby but one works and taking classes, the other works and has young Occupational hygienist Level of Independence: Independent         Comments: Retired from work. Has had harder time managing household since husband passed away. Enjoys time with daughters and grandkids     Engineer, manufacturing  [place]        Extremity/Trunk Assessment   Upper Extremity Assessment Upper Extremity Assessment: Generalized weakness    Lower Extremity Assessment Lower Extremity Assessment: Generalized weakness       Communication   Communication: No difficulties  Cognition Arousal/Alertness: Awake/alert Behavior During Therapy: WFL for tasks assessed/performed;Flat affect Overall Cognitive  Status: Impaired/Different from baseline Area of Impairment: Problem solving                             Problem Solving: Slow processing;Requires verbal cues General Comments: Attribute apparent cognitive impairment to likely fatigue and "covid fog"      General Comments General comments (skin integrity, edema, etc.): SpO2 87-92% on 20L O2 HHFNC at 100% FiO2, down to 79% with standing activity    Exercises Other Exercises Other Exercises: Seated ankle pumps, LAQ   Assessment/Plan    PT Assessment Patient needs continued PT services  PT Problem List Decreased strength;Decreased activity tolerance;Decreased balance;Decreased mobility;Decreased knowledge of use of DME;Cardiopulmonary status limiting activity;Decreased knowledge of precautions       PT Treatment Interventions DME instruction;Gait training;Stair training;Functional mobility training;Therapeutic activities;Therapeutic exercise;Balance training;Patient/family education    PT Goals (Current goals can be found in the Care Plan section)  Acute Rehab PT Goals Patient Stated Goal: Hopeful for return home, but willing to consider post-acute rehab at SNF PT Goal Formulation: With patient Time For Goal Achievement: 09/05/20 Potential to Achieve Goals: Good    Frequency Min 3X/week   Barriers to discharge Decreased caregiver support      Co-evaluation               AM-PAC PT "6 Clicks" Mobility  Outcome Measure Help needed turning from your back to your side while in a flat bed without using bedrails?: A Little Help needed moving from lying on your back to sitting on the side of a flat bed without using bedrails?: A Little Help needed moving to and from a bed to a chair (including a wheelchair)?: A Little Help needed standing up from a chair using your arms (e.g., wheelchair or bedside chair)?: A Little Help needed to walk in hospital room?: A Little Help needed climbing 3-5 steps with a railing? : A  Lot 6 Click Score: 17    End of Session Equipment Utilized During Treatment: Oxygen Activity Tolerance: Patient limited by fatigue;Treatment limited secondary to medical complications (Comment) Patient left: in chair;with call bell/phone within reach;with chair alarm set Nurse Communication: Mobility status PT Visit Diagnosis: Other abnormalities of gait and mobility (R26.89);Muscle weakness (generalized) (M62.81)    Time: 3335-4562 PT Time Calculation (min) (ACUTE ONLY): 25 min   Charges:   PT Evaluation $PT Eval Moderate Complexity: 1 Mod PT Treatments $Therapeutic Exercise: 8-22 mins   Mabeline Caras, PT, DPT Acute Rehabilitation Services  Pager [phone] Office [place] 08/22/2020, 5:15 PM

## 2020-08-22 NOTE — Progress Notes (Signed)
Nephrology Consult Follow Up Note [facility] Admit: 08/18/2020 LOS: 3  Subjective:   Seen and examined bedside. No acute events, reports that her breathing is still rough but better. uop 1.2L   01/13 0701 - 01/14 0700 In: 1224.1 [P.O.:797; I.V.:327.2; IV Piggyback:99.9] Out: 1275 [Urine:1275]  Filed Weights   08/20/20 0405 08/21/20 0453 08/21/20 1716  Weight: 88.5 kg 88.6 kg 88.6 kg    Scheduled Meds: . amLODipine  5 mg Oral Daily  . vitamin C  500 mg Oral Daily  . aspirin EC  81 mg Oral Daily  . Chlorhexidine Gluconate Cloth  6 each Topical Daily  . fluticasone  1 spray Each Nare Daily  . Gerhardt's butt cream   Topical Daily  . heparin  5,000 Units Subcutaneous Q8H  . insulin aspart  0-15 Units Subcutaneous TID WC  . insulin aspart  4 Units Subcutaneous TID WC  . insulin detemir  15 Units Subcutaneous BID  . levothyroxine  75 mcg Oral Q0600  . mouth rinse  15 mL Mouth Rinse BID  . methylPREDNISolone (SOLU-MEDROL) injection  60 mg Intravenous Q12H  . metoprolol tartrate  75 mg Oral BID  . pantoprazole  40 mg Oral Daily  . simvastatin  10 mg Oral q1800  . sodium bicarbonate  650 mg Oral TID  . tacrolimus  3 mg Oral QHS  . tacrolimus  4 mg Oral Daily  . zinc sulfate  220 mg Oral Daily   Continuous Infusions: . sodium chloride    . cefTRIAXone (ROCEPHIN)  IV 1 g (08/21/20 2114)  . remdesivir 100 mg in NS 100 mL 100 mg (08/22/20 1032)   PRN Meds:.sodium chloride, acetaminophen **OR** acetaminophen, docusate sodium, guaiFENesin, hydrALAZINE, labetalol, loperamide, polyethylene glycol   Physical Exam:  Blood pressure (!) 153/67, pulse 67, temperature 99 F (37.2 C), temperature source Oral, resp. rate 17, height 5\' 2"  (1.575 m), weight 88.6 kg, SpO2 93 %. GEN: nad, chronically ill appearing CV: reg rate PULM: unlabored, bl chest expansion ABD: soft, nontender, nondistended, +BS EXT: no edema Neuro: awake, follows commands    Recent Labs  Lab  08/19/20 1019 08/19/20 1336 08/20/20 1101 08/21/20 0034 08/22/20 0129  NA 135   < > 137 140 145  K 5.4*   < > 5.3* 4.8 4.5  CL 109   < > 106 105 106  CO2 13*   < > 17* 22 25  GLUCOSE 398*   < > 160* 220* 122*  BUN 93*   < > 102* 97* 88*  CREATININE 3.84*   < > 3.43* 3.05* 2.44*  CALCIUM 10.0   < > 9.8 9.5 9.7  PHOS 4.1  --   --   --   --    < > = values in this interval not displayed.   Recent Labs  Lab 08/20/20 0135 08/21/20 0118 08/22/20 0129  WBC 6.1 9.4 7.6  NEUTROABS 5.5 8.4* 6.7  HGB 11.1* 11.6* 10.9*  HCT 33.4* 35.2* 33.6*  MCV 73.9* 74.7* 74.7*  PLT 96* 121* 120*    Assessment:  [name] [name] is a 65 yo F with hx of DDRT x2 (latest 2016) on prograf, myfortic, and low-dose prednisone, T2DM, hypothyroidism, and recent diagnosis of COVID-19 presenting with acute hypoxic respiratory failure 2/2 COVID PNA. Also found to be in DKA with worsening AoCKD and hyperkalemia on labs.  Assessment:  1. AKI on CKD, improving. Non-oliguric  2. H/O DDKT x 2 (latest 2016). KDPI 90%, PRA 24%. Course complicated  by acute pyelonephritis 2016. Baseline Cr 1.3-1.7.Follows w/ Dr. Marval Regal ([facility]) & [facility]  3. Chronic immunosuppression  4. AHRF secondary COVID-19 PNA  5. DKA, h/o uncontrolled DM2w/ hyperglycemia, resolved  6. Hyperkalemia, resolved  7. Anion Gap Metabolic acidosis, resolved 8. HTN  9. Klebsiella UTI, on rocephin  Plan:  -Cr down to 2.4, great urine output, continue to monitor labs -will continue to hold MMF, has added benefit/protection w/ steroids -supratherpeutic trough on 1/10 (9.4), repeat trough ordered for this AM  -targeting tac goal 5-7 -continue with reduced dose tacrolimus, trough from presentation still pending  -stop lokelma -no indication for renal replacement therapy  Gean Quint, MD  [facility] 08/22/2020, 1:29 PM

## 2020-08-22 NOTE — Progress Notes (Signed)
PROGRESS NOTE                                                                             PROGRESS NOTE                                                                                                                                                                                                             Patient Demographics:    [name] [name], is a 65 y.o. female, DOB - 08-Jun-1956, DDU:[id number]  Outpatient Primary MD for the patient is Glendale Chard, MD    LOS - 3  Admit date - 08/18/2020    Chief Complaint  Patient presents with  . Covid Positive  . Chest Pain  . Weakness       Brief Narrative    [name] [name] is a 65 yo F with history of renal transplant x2 (latest in 2016) on chronic immunosuppression including Myfortic, Prograf and steroids, type 2 diabetes mellitus , hypothyroidism, and anemia. -Patient presents to ED due to worsening dyspnea, cough, generalized body ache and fatigue, patient with recent positive test on 1/5, she is with significant hypoxia, admitted initially to ICU on heated high flow nasal cannula, she was transferred to triage service 1/14, she is being followed by nephrology service due to worsening renal function with metabolic acidosis and hyperkalemia.    Subjective:    [name] [name] today for generalized weakness, fatigue, complains of pain at Flexi-Seal site, reports dyspnea and cough.      Assessment  & Plan :    Principal Problem:   ARF (acute renal failure) (HCC) Active Problems:   AKI (acute kidney injury) ([place])   Renal transplant recipient   Immunocompromised ([place])   DM (diabetes mellitus), type 2 with renal complications ([place])   Essential hypertension with goal blood pressure less than 140/90   Acute hypoxemic respiratory failure due to COVID-19 ([place])   Respiratory failure (HCC)   Acute hyperkalemia   Diabetic ketoacidosis without coma associated with type 1 diabetes mellitus  ([place])  Pressure injury of skin  Acute Hypoxic Resp. Failure due to Acute Covid 19 Viral Pneumonitis during the ongoing 2020 Covid 19 Pandemic  -Fully vaccinated with [employer] x2, and booster. -Unfortunately she did develop severe COVID-19 pneumonia given her chronic immune suppressed status due to her renal transplant. -Continue with IV steroids, will change IV Decadron to IV Solu-Medrol given severe hypoxia. -Continue with remdesivir. -Continue to trend inflammatory markers  Encouraged the patient to sit up in chair in the daytime use I-S and flutter valve for pulmonary toiletry and then prone in bed when at night.  Will advance activity and titrate down oxygen as possible.     SpO2: 93 % O2 Flow Rate (L/min): 25 L/min FiO2 (%): 90 %  Recent Labs  Lab 08/18/20 2030 08/18/20 2128 08/18/20 2230 08/19/20 0216 08/19/20 1019 08/20/20 0135 08/21/20 0034 08/21/20 0118 08/22/20 0129  WBC  --   --   --  5.8 4.1 6.1  --  9.4 7.6  PLT  --   --   --  94* 91* 96*  --  121* 120*  CRP 12.2*  --   --   --   --  17.0* 10.5*  --  11.5*  DDIMER 1.81*  --   --   --   --  1.61* 1.31*  --  1.11*  PROCALCITON 0.46  --   --   --   --   --   --   --   --   AST  --   --   --   --   --  19 21  --  24  ALT  --   --   --   --   --  14 11  --  15  ALKPHOS  --   --   --   --   --  51 49  --  57  BILITOT  --   --   --   --   --  0.5 0.7  --  0.5  ALBUMIN  --   --   --   --  3.3* 3.0* 2.8*  --  2.8*  LATICACIDVEN  --  1.7 1.6  --   --   --   --   --   --     FiO2 (%):  [85 %-100 %] 90 %  ABG     Component Value Date/Time   PHART 7.266 (L) 08/19/2020 0808   PCO2ART 32.3 08/19/2020 0808   PO2ART 186 (H) 08/19/2020 0808   HCO3 19.0 (L) 08/20/2020 1101   TCO2 16 (L) 08/19/2020 0808   ACIDBASEDEF 6.4 (H) 08/20/2020 1101   O2SAT 85.1 08/20/2020 1101     AKI on CKD,  -Renal input greatly appreciated. -Currently off bicarb drip. -Patient with history renal transplant, immunosuppression therapy  management per renal. -With hyperkalemia, improved with Lokelma Likely combination of hypovolemia and tacrolimus related kidney failure  Hx renal transplant x 2 (last 2016)  - on mycophenolate, prograf, steroids. -Immunosuppression management per renal  Diabetes mellitus, type II, poorly controlled with DKA  -Resolved, off insulin drip  -CBG on the lower side today, will lower her Levemir to from 20 units twice daily to 15 units twice daily, continue with insulin sliding scale, continue with meal coverage.  Hypertension - Continue Amlodipine, Lopressor - prn labetalol   Hypothyroidism -Continue home levothyroxine.  Klebsiella UTI  - continue ceftriaxone   Condition - Extremely Guarded  Code Status :  Full  Consults  :  Renal,PCCm  Procedures  :  none  Disposition Plan  :    Status is: Inpatient  Remains inpatient appropriate because:IV treatments appropriate due to intensity of illness or inability to take PO   Dispo: The patient is from: Home              Anticipated d/c is to: Home              Anticipated d/c date is: > 3 days              Patient currently is not medically stable to d/c.      DVT Prophylaxis  :   Heparin  Lab Results  Component Value Date   PLT 120 (L) 08/22/2020    Diet :  Diet Order            Diet renal/carb modified with fluid restriction Diet-HS Snack? Nothing; Fluid restriction: 1200 mL Fluid; Room service appropriate? Yes; Fluid consistency: Thin  Diet effective now                  Inpatient Medications  Scheduled Meds: . amLODipine  5 mg Oral Daily  . vitamin C  500 mg Oral Daily  . aspirin EC  81 mg Oral Daily  . Chlorhexidine Gluconate Cloth  6 each Topical Daily  . dexamethasone (DECADRON) injection  6 mg Intravenous Q24H  . fluticasone  1 spray Each Nare Daily  . Gerhardt's butt cream   Topical Daily  . heparin  5,000 Units Subcutaneous Q8H  . insulin aspart  0-15 Units Subcutaneous TID WC  . insulin  aspart  4 Units Subcutaneous TID WC  . insulin detemir  20 Units Subcutaneous BID  . levothyroxine  75 mcg Oral Q0600  . mouth rinse  15 mL Mouth Rinse BID  . metoprolol tartrate  75 mg Oral BID  . pantoprazole  40 mg Oral Daily  . simvastatin  10 mg Oral q1800  . sodium bicarbonate  650 mg Oral TID  . tacrolimus  3 mg Oral QHS  . tacrolimus  4 mg Oral Daily  . zinc sulfate  220 mg Oral Daily   Continuous Infusions: . sodium chloride    . cefTRIAXone (ROCEPHIN)  IV 1 g (08/21/20 2114)  . remdesivir 100 mg in NS 100 mL 100 mg (08/22/20 1032)   PRN Meds:.sodium chloride, acetaminophen **OR** acetaminophen, docusate sodium, guaiFENesin, hydrALAZINE, labetalol, loperamide, polyethylene glycol  Antibiotics  :    Anti-infectives (From admission, onward)   Start     Dose/Rate Route Frequency Ordered Stop   08/20/20 1445  cefTRIAXone (ROCEPHIN) 1 g in sodium chloride 0.9 % 100 mL IVPB        1 g 200 mL/hr over 30 Minutes Intravenous Every 24 hours 08/20/20 1348 08/27/20 2159   08/20/20 1000  remdesivir 100 mg in sodium chloride 0.9 % 100 mL IVPB       "Followed by" Linked Group Details   100 mg 200 mL/hr over 30 Minutes Intravenous Daily 08/19/20 0021 08/24/20 0959   08/19/20 0130  remdesivir 200 mg in sodium chloride 0.9% 250 mL IVPB       "Followed by" Linked Group Details   200 mg 580 mL/hr over 30 Minutes Intravenous Once 08/19/20 0021 08/19/20 0314        Emeline Gins Jospeh Mangel M.D on 08/22/2020 at 1:08 PM  To page go to [email or web address] -  [facility] -  Office  [phone]  Objective:   Vitals:   08/22/20 0248 08/22/20 0411 08/22/20 0800 08/22/20 1208  BP:  105/70 110/82 (!) 153/67  Pulse:  68 69 67  Resp:  18 19 17   Temp:  98.1 F (36.7 C) 98 F (36.7 C) 99 F (37.2 C)  TempSrc:  Oral Oral Oral  SpO2: 100% 93% 100% 93%  Weight:      Height:        Wt Readings from Last 3 Encounters:  08/21/20 88.6 kg  05/14/20 92.5 kg  05/14/20 92.9 kg      Intake/Output Summary (Last 24 hours) at 08/22/2020 1308 Last data filed at 08/22/2020 1100 Gross per 24 hour  Intake 717 ml  Output 725 ml  Net -8 ml     Physical Exam  Awake Alert, frail,No new F.N deficits, Normal affect Symmetrical Chest wall movement, Good air movement bilaterally, CTAB RRR,No Gallops,Rubs or new Murmurs, No Parasternal Heave +ve B.Sounds, Abd Soft, No tendernes No Cyanosis, Clubbing or edema, No new Rash or bruise      Data Review:    CBC Recent Labs  Lab 08/19/20 0216 08/19/20 0808 08/19/20 1019 08/20/20 0135 08/21/20 0118 08/22/20 0129  WBC 5.8  --  4.1 6.1 9.4 7.6  HGB 11.4* 10.9* 11.2* 11.1* 11.6* 10.9*  HCT 33.0* 32.0* 35.1* 33.4* 35.2* 33.6*  PLT 94*  --  91* 96* 121* 120*  MCV 74.2*  --  76.5* 73.9* 74.7* 74.7*  MCH 25.6*  --  24.4* 24.6* 24.6* 24.2*  MCHC 34.5  --  31.9 33.2 33.0 32.4  RDW 14.6  --  14.6 14.4 14.4 14.5  LYMPHSABS  --   --   --  0.3* 0.4* 0.3*  MONOABS  --   --   --  0.2 0.5 0.5  EOSABS  --   --   --  0.0 0.0 0.0  BASOSABS  --   --   --  0.0 0.0 0.0    Recent Labs  Lab 08/18/20 2030 08/18/20 2128 08/18/20 2230 08/19/20 0216 08/19/20 0217 08/19/20 0808 08/19/20 1019 08/19/20 1336 08/20/20 0135 08/20/20 1101 08/21/20 0034 08/22/20 0129  NA  --   --   --   --   --    < > 135 136 138 137 140 145  K  --   --   --   --   --    < > 5.4* 4.9 5.7* 5.3* 4.8 4.5  CL  --   --   --   --   --   --  109 105 108 106 105 106  CO2  --   --   --   --   --   --  13* 18* 16* 17* 22 25  GLUCOSE  --   --   --   --   --   --  398* 141* 281* 160* 220* 122*  BUN  --   --   --   --   --   --  93* 89* 100* 102* 97* 88*  CREATININE  --   --   --    < >  --   --  3.84* 3.62* 3.82* 3.43* 3.05* 2.44*  CALCIUM  --   --   --   --   --   --  10.0 9.3 10.1 9.8 9.5 9.7  AST  --   --   --   --   --   --   --   --  19  --  21 24  ALT  --   --   --   --   --   --   --   --  14  --  11 15  ALKPHOS  --   --   --   --   --   --   --   --   51  --  49 57  BILITOT  --   --   --   --   --   --   --   --  0.5  --  0.7 0.5  ALBUMIN  --   --   --   --   --   --  3.3*  --  3.0*  --  2.8* 2.8*  CRP 12.2*  --   --   --   --   --   --   --  17.0*  --  10.5* 11.5*  DDIMER 1.81*  --   --   --   --   --   --   --  1.61*  --  1.31* 1.11*  PROCALCITON 0.46  --   --   --   --   --   --   --   --   --   --   --   LATICACIDVEN  --  1.7 1.6  --   --   --   --   --   --   --   --   --   HGBA1C  --   --   --   --  8.2*  --   --   --   --   --   --   --    < > = values in this interval not displayed.    ------------------------------------------------------------------------------------------------------------------ No results for input(s): CHOL, HDL, LDLCALC, TRIG, CHOLHDL, LDLDIRECT in the last 72 hours.  Lab Results  Component Value Date   HGBA1C 8.2 (H) 08/19/2020   ------------------------------------------------------------------------------------------------------------------ No results for input(s): TSH, T4TOTAL, T3FREE, THYROIDAB in the last 72 hours.  Invalid input(s): FREET3  Cardiac Enzymes No results for input(s): CKMB, TROPONINI, MYOGLOBIN in the last 168 hours.  Invalid input(s): CK ------------------------------------------------------------------------------------------------------------------ No results found for: BNP  Micro Results Recent Results (from the past 240 hour(s))  Blood Culture (routine x 2)     Status: None (Preliminary result)   Collection Time: 08/18/20  8:56 PM   Specimen: BLOOD  Result Value Ref Range Status   Specimen Description BLOOD SITE NOT SPECIFIED  Final   Special Requests   Final    BOTTLES DRAWN AEROBIC ONLY Blood Culture results may not be optimal due to an inadequate volume of blood received in culture bottles   Culture   Final    NO GROWTH 4 DAYS Performed at [facility], [place] [address], [place], [place] [address]    Report Status PENDING  Incomplete  Blood Culture (routine x  2)     Status: None (Preliminary result)   Collection Time: 08/18/20  8:56 PM   Specimen: BLOOD  Result Value Ref Range Status   Specimen Description BLOOD SITE NOT SPECIFIED  Final   Special Requests   Final    BOTTLES DRAWN AEROBIC AND ANAEROBIC Blood Culture results may not be optimal due to an inadequate volume of blood received in culture bottles   Culture   Final    NO GROWTH 4 DAYS Performed at [facility],  [address], [place], [place] [address]    Report Status PENDING  Incomplete  Culture, Urine     Status: Abnormal   Collection Time: 08/19/20 10:07 AM   Specimen: Urine, Random  Result Value Ref Range Status   Specimen Description URINE, RANDOM  Final   Special Requests   Final    NONE Performed at [facility], [place] [address] [place], [place] [address]    Culture >=100,000 COLONIES/mL KLEBSIELLA PNEUMONIAE (A)  Final   Report Status 08/21/2020 FINAL  Final   Organism ID, Bacteria KLEBSIELLA PNEUMONIAE (A)  Final      Susceptibility   Klebsiella pneumoniae - MIC*    AMPICILLIN >=32 RESISTANT Resistant     CEFAZOLIN <=4 SENSITIVE Sensitive     CEFEPIME <=0.12 SENSITIVE Sensitive     CEFTRIAXONE <=0.25 SENSITIVE Sensitive     CIPROFLOXACIN <=0.25 SENSITIVE Sensitive     GENTAMICIN <=1 SENSITIVE Sensitive     IMIPENEM <=0.25 SENSITIVE Sensitive     NITROFURANTOIN 32 SENSITIVE Sensitive     TRIMETH/SULFA <=20 SENSITIVE Sensitive     AMPICILLIN/SULBACTAM 8 SENSITIVE Sensitive     PIP/TAZO <=4 SENSITIVE Sensitive     * >=100,000 COLONIES/mL KLEBSIELLA PNEUMONIAE  MRSA PCR Screening     Status: None   Collection Time: 08/20/20 12:22 AM   Specimen: Nasopharyngeal  Result Value Ref Range Status   MRSA by PCR NEGATIVE NEGATIVE Final    Comment:        The GeneXpert MRSA Assay (FDA approved for NASAL specimens only), is one component of a comprehensive MRSA colonization surveillance program. It is not intended to diagnose MRSA infection nor to guide  or monitor treatment for MRSA infections. Performed at [facility], [place] [address], [place], [place] [address]     Radiology Reports US Renal Transplant w/Doppler  Result Date: 08/19/2020 CLINICAL DATA:  Acute kidney injury EXAM: ULTRASOUND OF RENAL TRANSPLANT WITH RENAL DOPPLER ULTRASOUND TECHNIQUE: Ultrasound examination of the renal transplant was performed with gray-scale, color and duplex doppler evaluation. COMPARISON:  None. FINDINGS: Transplant kidney location: Left lower quadrant Transplant Kidney: Renal measurements: 10.8 x 6.2 x 6.5 cm = volume: 227mL. Normal in size and parenchymal echogenicity. No evidence of mass or hydronephrosis. No peri-transplant fluid collection seen. Color flow in the main renal artery:  Yes Color flow in the main renal vein:  Yes Duplex Doppler Evaluation: Main Renal Artery Velocity:  cm/sec Main Renal Artery Resistive Index: 0.99 Venous waveform in main renal vein:  Present Intrarenal resistive index in upper pole:  0.98 (normal 0.6-0.8; equivocal 0.8-0.9; abnormal >= 0.9) Intrarenal resistive index in lower pole: 0.98 (normal 0.6-0.8; equivocal 0.8-0.9; abnormal >= 0.9) Bladder: Normal for degree of bladder distention. Other findings:  None. IMPRESSION: Elevated resistive indices within the left lower quadrant transplant kidney. No hydronephrosis. Electronically Signed   By: Rolm Baptise M.D.   On: 08/19/2020 19:15   DG Chest Portable 1 View  Result Date: 08/18/2020 CLINICAL DATA:  65 year old female with history of COVID infection. Chest pain. Shortness of breath. EXAM: PORTABLE CHEST 1 VIEW COMPARISON:  Chest x-ray 10/20/2014. FINDINGS: Patchy multifocal airspace consolidation and areas of interstitial prominence are noted throughout the lungs bilaterally, most severe throughout the periphery of the mid to lower left lung, indicative of multilobar bilateral pneumonia. No definite pleural effusions. No evidence of pulmonary edema. No pneumothorax. Heart  size is borderline enlarged. Upper mediastinal contours are within normal limits. Atherosclerotic calcifications in the thoracic aorta.  Vascular stent projecting over the right subclavian region. IMPRESSION: 1. Severe multilobar bilateral (left greater than right) pneumonia compatible with reported COVID infection. 2. Aortic atherosclerosis. Electronically Signed   By: Vinnie Langton M.D.   On: 08/18/2020 18:02

## 2020-08-23 DIAGNOSIS — N179 Acute kidney failure, unspecified: Secondary | ICD-10-CM | POA: Diagnosis not present

## 2020-08-23 DIAGNOSIS — J9600 Acute respiratory failure, unspecified whether with hypoxia or hypercapnia: Secondary | ICD-10-CM

## 2020-08-23 DIAGNOSIS — U071 COVID-19: Secondary | ICD-10-CM | POA: Diagnosis not present

## 2020-08-23 DIAGNOSIS — E1010 Type 1 diabetes mellitus with ketoacidosis without coma: Secondary | ICD-10-CM | POA: Diagnosis not present

## 2020-08-23 LAB — CULTURE, BLOOD (ROUTINE X 2)
Culture: NO GROWTH
Culture: NO GROWTH

## 2020-08-23 LAB — PROCALCITONIN: Procalcitonin: 0.26 ng/mL

## 2020-08-23 LAB — CBC WITH DIFFERENTIAL/PLATELET
Abs Immature Granulocytes: 0.06 10*3/uL (ref 0.00–0.07)
Basophils Absolute: 0 10*3/uL (ref 0.0–0.1)
Basophils Relative: 0 %
Eosinophils Absolute: 0 10*3/uL (ref 0.0–0.5)
Eosinophils Relative: 0 %
HCT: 33.2 % — ABNORMAL LOW (ref 36.0–46.0)
Hemoglobin: 10.8 g/dL — ABNORMAL LOW (ref 12.0–15.0)
Immature Granulocytes: 1 %
Lymphocytes Relative: 6 %
Lymphs Abs: 0.3 10*3/uL — ABNORMAL LOW (ref 0.7–4.0)
MCH: 24.5 pg — ABNORMAL LOW (ref 26.0–34.0)
MCHC: 32.5 g/dL (ref 30.0–36.0)
MCV: 75.5 fL — ABNORMAL LOW (ref 80.0–100.0)
Monocytes Absolute: 0.2 10*3/uL (ref 0.1–1.0)
Monocytes Relative: 4 %
Neutro Abs: 5.1 10*3/uL (ref 1.7–7.7)
Neutrophils Relative %: 89 %
Platelets: 145 10*3/uL — ABNORMAL LOW (ref 150–400)
RBC: 4.4 MIL/uL (ref 3.87–5.11)
RDW: 14.3 % (ref 11.5–15.5)
WBC: 5.7 10*3/uL (ref 4.0–10.5)
nRBC: 0 % (ref 0.0–0.2)

## 2020-08-23 LAB — COMPREHENSIVE METABOLIC PANEL
ALT: 12 U/L (ref 0–44)
AST: 27 U/L (ref 15–41)
Albumin: 2.8 g/dL — ABNORMAL LOW (ref 3.5–5.0)
Alkaline Phosphatase: 67 U/L (ref 38–126)
Anion gap: 15 (ref 5–15)
BUN: 83 mg/dL — ABNORMAL HIGH (ref 8–23)
CO2: 21 mmol/L — ABNORMAL LOW (ref 22–32)
Calcium: 9.6 mg/dL (ref 8.9–10.3)
Chloride: 104 mmol/L (ref 98–111)
Creatinine, Ser: 2.25 mg/dL — ABNORMAL HIGH (ref 0.44–1.00)
GFR, Estimated: 24 mL/min — ABNORMAL LOW (ref >60)
Glucose, Bld: 177 mg/dL — ABNORMAL HIGH (ref 70–99)
Potassium: 4.8 mmol/L (ref 3.5–5.1)
Sodium: 140 mmol/L (ref 135–145)
Total Bilirubin: 0.7 mg/dL (ref 0.3–1.2)
Total Protein: 6.6 g/dL (ref 6.5–8.1)

## 2020-08-23 LAB — C-REACTIVE PROTEIN: CRP: 10.2 mg/dL — ABNORMAL HIGH (ref <1.0)

## 2020-08-23 LAB — D-DIMER, QUANTITATIVE: D-Dimer, Quant: 1.39 ug/mL-FEU — ABNORMAL HIGH (ref 0.00–0.50)

## 2020-08-23 LAB — GLUCOSE, CAPILLARY
Glucose-Capillary: 208 mg/dL — ABNORMAL HIGH (ref 70–99)
Glucose-Capillary: 261 mg/dL — ABNORMAL HIGH (ref 70–99)
Glucose-Capillary: 290 mg/dL — ABNORMAL HIGH (ref 70–99)
Glucose-Capillary: 181 mg/dL — ABNORMAL HIGH (ref 70–99)

## 2020-08-23 NOTE — Progress Notes (Signed)
Nephrology Consult Follow Up Note [facility] Admit: 08/18/2020 LOS: 4  Subjective:   Due to the [place] pandemic, in attempts to limit transmission and over-utilization of resources (e.g. PPE), the patient was seen virtually today by means of chart review, discussion with staff, and virtual discussion with patient as needed.  No acute events. uop 0.8L + 1 unmeasured void. Cr stable 2.3   01/14 0701 - 01/15 0700 In: 460 [P.O.:360; IV Piggyback:100] Out: 299 [Urine:800; Stool:1]  Filed Weights   08/20/20 0405 08/21/20 0453 08/21/20 1716  Weight: 88.5 kg 88.6 kg 88.6 kg    Scheduled Meds: . amLODipine  5 mg Oral Daily  . vitamin C  500 mg Oral Daily  . aspirin EC  81 mg Oral Daily  . Chlorhexidine Gluconate Cloth  6 each Topical Daily  . fluticasone  1 spray Each Nare Daily  . Gerhardt's butt cream   Topical Daily  . heparin  5,000 Units Subcutaneous Q8H  . insulin aspart  0-15 Units Subcutaneous TID WC  . insulin aspart  4 Units Subcutaneous TID WC  . insulin detemir  15 Units Subcutaneous BID  . levothyroxine  75 mcg Oral Q0600  . mouth rinse  15 mL Mouth Rinse BID  . methylPREDNISolone (SOLU-MEDROL) injection  60 mg Intravenous Q12H  . metoprolol tartrate  75 mg Oral BID  . pantoprazole  40 mg Oral Daily  . simvastatin  10 mg Oral q1800  . sodium bicarbonate  650 mg Oral TID  . tacrolimus  3 mg Oral QHS  . tacrolimus  4 mg Oral Daily  . zinc sulfate  220 mg Oral Daily   Continuous Infusions: . sodium chloride    . cefTRIAXone (ROCEPHIN)  IV 1 g (08/22/20 2233)   PRN Meds:.sodium chloride, acetaminophen **OR** acetaminophen, docusate sodium, guaiFENesin, hydrALAZINE, labetalol, loperamide, polyethylene glycol   Physical Exam:  Blood pressure (!) 183/83, pulse 75, temperature 97.6 F (36.4 C), temperature source Oral, resp. rate 20, height 5\' 2"  (1.575 m), weight 88.6 kg, SpO2 92 %. Not examined    Recent Labs  Lab 08/19/20 1019 08/19/20 1336  08/21/20 0034 08/22/20 0129 08/23/20 0314  NA 135   < > 140 145 140  K 5.4*   < > 4.8 4.5 4.8  CL 109   < > 105 106 104  CO2 13*   < > 22 25 21*  GLUCOSE 398*   < > 220* 122* 177*  BUN 93*   < > 97* 88* 83*  CREATININE 3.84*   < > 3.05* 2.44* 2.25*  CALCIUM 10.0   < > 9.5 9.7 9.6  PHOS 4.1  --   --   --   --    < > = values in this interval not displayed.   Recent Labs  Lab 08/21/20 0118 08/22/20 0129 08/23/20 0609  WBC 9.4 7.6 5.7  NEUTROABS 8.4* 6.7 5.1  HGB 11.6* 10.9* 10.8*  HCT 35.2* 33.6* 33.2*  MCV 74.7* 74.7* 75.5*  PLT 121* 120* 145*    Assessment:  [name] [name] is a 65 yo F with hx of DDRT x2 (latest 2016) on prograf, myfortic, and low-dose prednisone, T2DM, hypothyroidism, and recent diagnosis of COVID-19 presenting with acute hypoxic respiratory failure 2/2 COVID PNA. Also found to be in DKA with worsening AoCKD and hyperkalemia on labs.  Assessment:  1. AKI on CKD, improving. Non-oliguric  2. H/O DDKT x 2 (latest 2016). KDPI 90%, PRA 24%. Course complicated by acute pyelonephritis 2016.  Baseline Cr 1.3-1.7.Follows w/ Dr. Marval Regal ([facility]) & [facility]  3. Chronic immunosuppression  4. AHRF secondary COVID-19 PNA  5. DKA, h/o uncontrolled DM2w/ hyperglycemia, resolved  6. Hyperkalemia, resolved. Off lokelma  7. Anion Gap Metabolic acidosis, resolved/stable 8. HTN  9. Klebsiella UTI, on rocephin  Plan:  -Cr down to 2.3, great urine output, continue to monitor labs -will continue to hold MMF, has added benefit/protection w/ steroids -supratherpeutic trough on 1/10 (9.4), repeat trough ordered for this AM  -targeting tac goal 5-7 -continue with reduced dose tacrolimus, trough from presentation still pending  -watch bicarb, if trending down then please check lactate/sugars. Can increase nahco3 to 1300mg  tid if needed. No changes for today -no indication for renal replacement therapy and hopefully will avoid this   Gean Quint, MD   [facility] 08/23/2020, 1:41 PM

## 2020-08-23 NOTE — Progress Notes (Signed)
PROGRESS NOTE                                                                             PROGRESS NOTE                                                                                                                                                                                                             Patient Demographics:    [name] [name], is a 66 y.o. female, DOB - 1955-11-17, XBM:[id number]  Outpatient Primary MD for the patient is Glendale Chard, MD    LOS - 4  Admit date - 08/18/2020    Chief Complaint  Patient presents with  . Covid Positive  . Chest Pain  . Weakness       Brief Narrative    [name] [name] is a 65 yo F with history of renal transplant x2 (latest in 2016) on chronic immunosuppression including Myfortic, Prograf and steroids, type 2 diabetes mellitus , hypothyroidism, and anemia. -Patient presents to ED due to worsening dyspnea, cough, generalized body ache and fatigue, patient with recent positive test on 1/5, she is with significant hypoxia, admitted initially to ICU on heated high flow nasal cannula, she was transferred to triage service 1/14, she is being followed by nephrology service due to worsening renal function with metabolic acidosis and hyperkalemia.    Subjective:    [name] [name] today ports her diarrhea has improved, still report generalized weakness, fatigue, as well reports dyspnea and cough.    Assessment  & Plan :    Principal Problem:   ARF (acute renal failure) (HCC) Active Problems:   AKI (acute kidney injury) ([place])   Renal transplant recipient   Immunocompromised ([place])   DM (diabetes mellitus), type 2 with renal complications ([place])   Essential hypertension with goal blood pressure less than 140/90   Acute hypoxemic respiratory failure due to COVID-19 ([place])   Respiratory failure (HCC)   Acute hyperkalemia   Diabetic ketoacidosis without coma associated with type 1 diabetes  mellitus ([place])  Pressure injury of skin  Acute Hypoxic Resp. Failure due to Acute Covid 19 Viral Pneumonitis during the ongoing 2020 Covid 19 Pandemic  -Fully vaccinated with [employer] x2, and booster. -Unfortunately she did develop severe COVID-19 pneumonia given her chronic immune suppressed status due to her renal transplant. -Continue with IV steroids, will change IV Decadron to IV Solu-Medrol given severe hypoxia. -Continue with remdesivir. -Continue to trend inflammatory markers -Procalcitonin trending down which is reassuring  Encouraged the patient to sit up in chair in the daytime use I-S and flutter valve for pulmonary toiletry and then prone in bed when at night.  Will advance activity and titrate down oxygen as possible.     SpO2: 92 % (Simultaneous filing. User may not have seen previous data.) O2 Flow Rate (L/min): 25 L/min FiO2 (%): 70 %  Recent Labs  Lab 08/18/20 2030 08/18/20 2128 08/18/20 2230 08/19/20 0216 08/19/20 1019 08/20/20 0135 08/21/20 0034 08/21/20 0118 08/22/20 0129 08/23/20 0314 08/23/20 0609 08/23/20 1039  WBC  --   --   --    < > 4.1 6.1  --  9.4 7.6  --  5.7  --   PLT  --   --   --    < > 91* 96*  --  121* 120*  --  145*  --   CRP 12.2*  --   --   --   --  17.0* 10.5*  --  11.5* 10.2*  --   --   DDIMER 1.81*  --   --   --   --  1.61* 1.31*  --  1.11*  --  1.39*  --   PROCALCITON 0.46  --   --   --   --   --   --   --   --   --   --  0.26  AST  --   --   --   --   --  19 21  --  24 27  --   --   ALT  --   --   --   --   --  14 11  --  15 12  --   --   ALKPHOS  --   --   --   --   --  51 49  --  57 67  --   --   BILITOT  --   --   --   --   --  0.5 0.7  --  0.5 0.7  --   --   ALBUMIN  --   --   --   --  3.3* 3.0* 2.8*  --  2.8* 2.8*  --   --   LATICACIDVEN  --  1.7 1.6  --   --   --   --   --   --   --   --   --    < > = values in this interval not displayed.    FiO2 (%):  [70 %-75 %] 70 %  ABG     Component Value Date/Time   PHART  7.266 (L) 08/19/2020 0808   PCO2ART 32.3 08/19/2020 0808   PO2ART 186 (H) 08/19/2020 0808   HCO3 19.0 (L) 08/20/2020 1101   TCO2 16 (L) 08/19/2020 0808   ACIDBASEDEF 6.4 (H) 08/20/2020 1101   O2SAT 85.1 08/20/2020 1101     AKI on CKD,  -Renal input greatly appreciated. -Currently off bicarb drip.  Bicarb is 21 this  morning, she remains on p.o. sodium bicarb -Patient with history renal transplant, immunosuppression therapy management per renal. -No further hyperkalemia, Lokelma stopped  Hx renal transplant x 2 (last 2016)  - on mycophenolate, prograf, steroids. -Immunosuppression medication management per renal  Diabetes mellitus, type II, poorly controlled with DKA  -Resolved, off insulin drip  -She had some elevated CBGs, but I will keep her on current dose of Levemir 15 units twice daily to avoid hypoglycemia, continue with aspirin sliding scale as well .  Hypertension - Continue Amlodipine, Lopressor - prn labetalol   Hypothyroidism -Continue home levothyroxine.  Klebsiella UTI  - continue ceftriaxone   Condition - Extremely Guarded  Family communication:  D/W daughter by phone  Code Status :  Full  Consults  :  Renal,PCCM  Procedures  :  none  Disposition Plan  :    Status is: Inpatient  Remains inpatient appropriate because:IV treatments appropriate due to intensity of illness or inability to take PO   Dispo: The patient is from: Home              Anticipated d/c is to: Home              Anticipated d/c date is: > 3 days              Patient currently is not medically stable to d/c.      DVT Prophylaxis  :   Heparin  Lab Results  Component Value Date   PLT 145 (L) 08/23/2020    Diet :  Diet Order            Diet renal/carb modified with fluid restriction Diet-HS Snack? Nothing; Fluid restriction: 1200 mL Fluid; Room service appropriate? Yes; Fluid consistency: Thin  Diet effective now                  Inpatient  Medications  Scheduled Meds: . amLODipine  5 mg Oral Daily  . vitamin C  500 mg Oral Daily  . aspirin EC  81 mg Oral Daily  . Chlorhexidine Gluconate Cloth  6 each Topical Daily  . fluticasone  1 spray Each Nare Daily  . Gerhardt's butt cream   Topical Daily  . heparin  5,000 Units Subcutaneous Q8H  . insulin aspart  0-15 Units Subcutaneous TID WC  . insulin aspart  4 Units Subcutaneous TID WC  . insulin detemir  15 Units Subcutaneous BID  . levothyroxine  75 mcg Oral Q0600  . mouth rinse  15 mL Mouth Rinse BID  . methylPREDNISolone (SOLU-MEDROL) injection  60 mg Intravenous Q12H  . metoprolol tartrate  75 mg Oral BID  . pantoprazole  40 mg Oral Daily  . simvastatin  10 mg Oral q1800  . sodium bicarbonate  650 mg Oral TID  . tacrolimus  3 mg Oral QHS  . tacrolimus  4 mg Oral Daily  . zinc sulfate  220 mg Oral Daily   Continuous Infusions: . sodium chloride    . cefTRIAXone (ROCEPHIN)  IV 1 g (08/22/20 2233)   PRN Meds:.sodium chloride, acetaminophen **OR** acetaminophen, docusate sodium, guaiFENesin, hydrALAZINE, labetalol, loperamide, polyethylene glycol  Antibiotics  :    Anti-infectives (From admission, onward)   Start     Dose/Rate Route Frequency Ordered Stop   08/20/20 1445  cefTRIAXone (ROCEPHIN) 1 g in sodium chloride 0.9 % 100 mL IVPB        1 g 200 mL/hr over 30 Minutes Intravenous Every 24 hours 08/20/20 1348 08/27/20  2159   08/20/20 1000  remdesivir 100 mg in sodium chloride 0.9 % 100 mL IVPB       "Followed by" Linked Group Details   100 mg 200 mL/hr over 30 Minutes Intravenous Daily 08/19/20 0021 08/23/20 1113   08/19/20 0130  remdesivir 200 mg in sodium chloride 0.9% 250 mL IVPB       "Followed by" Linked Group Details   200 mg 580 mL/hr over 30 Minutes Intravenous Once 08/19/20 0021 08/19/20 0314        Emeline Gins Barack Nicodemus M.D on 08/23/2020 at 12:05 PM  To page go to [email or web address] -  [facility] -  Office  [phone]       Objective:    Vitals:   08/22/20 2000 08/23/20 0110 08/23/20 0633 08/23/20 0800  BP: (!) 158/77  139/66 (!) 183/83  Pulse: 76  72 75  Resp: 20  18 20   Temp: (!) 97.5 F (36.4 C)  98.3 F (36.8 C) 97.6 F (36.4 C)  TempSrc: Oral  Oral Oral  SpO2: 100% 94% (!) 86% 92%  Weight:      Height:        Wt Readings from Last 3 Encounters:  08/21/20 88.6 kg  05/14/20 92.5 kg  05/14/20 92.9 kg     Intake/Output Summary (Last 24 hours) at 08/23/2020 1205 Last data filed at 08/23/2020 0916 Gross per 24 hour  Intake 340 ml  Output 801 ml  Net -461 ml     Physical Exam  Awake Alert, Oriented X 3, No new F.N deficits, Normal affect Symmetrical Chest wall movement, Good air movement bilaterally, CTAB RRR,No Gallops,Rubs or new Murmurs, No Parasternal Heave +ve B.Sounds, Abd Soft, No tenderness, No rebound - guarding or rigidity. No Cyanosis, Clubbing or edema, No new Rash or bruise       Data Review:    CBC Recent Labs  Lab 08/19/20 1019 08/20/20 0135 08/21/20 0118 08/22/20 0129 08/23/20 0609  WBC 4.1 6.1 9.4 7.6 5.7  HGB 11.2* 11.1* 11.6* 10.9* 10.8*  HCT 35.1* 33.4* 35.2* 33.6* 33.2*  PLT 91* 96* 121* 120* 145*  MCV 76.5* 73.9* 74.7* 74.7* 75.5*  MCH 24.4* 24.6* 24.6* 24.2* 24.5*  MCHC 31.9 33.2 33.0 32.4 32.5  RDW 14.6 14.4 14.4 14.5 14.3  LYMPHSABS  --  0.3* 0.4* 0.3* 0.3*  MONOABS  --  0.2 0.5 0.5 0.2  EOSABS  --  0.0 0.0 0.0 0.0  BASOSABS  --  0.0 0.0 0.0 0.0    Recent Labs  Lab 08/18/20 2030 08/18/20 2128 08/18/20 2230 08/19/20 0216 08/19/20 0217 08/19/20 0808 08/19/20 1019 08/19/20 1336 08/20/20 0135 08/20/20 1101 08/21/20 0034 08/22/20 0129 08/23/20 0314 08/23/20 0609 08/23/20 1039  NA  --   --   --   --   --    < > 135   < > 138 137 140 145 140  --   --   K  --   --   --   --   --    < > 5.4*   < > 5.7* 5.3* 4.8 4.5 4.8  --   --   CL  --   --   --   --   --   --  109   < > 108 106 105 106 104  --   --   CO2  --   --   --   --   --   --  13*   < > 16*  17* 22 25 21*  --   --   GLUCOSE  --   --   --   --   --   --  398*   < > 281* 160* 220* 122* 177*  --   --   BUN  --   --   --   --   --   --  93*   < > 100* 102* 97* 88* 83*  --   --   CREATININE  --   --   --    < >  --   --  3.84*   < > 3.82* 3.43* 3.05* 2.44* 2.25*  --   --   CALCIUM  --   --   --   --   --   --  10.0   < > 10.1 9.8 9.5 9.7 9.6  --   --   AST  --   --   --   --   --   --   --   --  19  --  21 24 27   --   --   ALT  --   --   --   --   --   --   --   --  14  --  11 15 12   --   --   ALKPHOS  --   --   --   --   --   --   --   --  51  --  49 57 67  --   --   BILITOT  --   --   --   --   --   --   --   --  0.5  --  0.7 0.5 0.7  --   --   ALBUMIN  --   --   --   --   --   --  3.3*  --  3.0*  --  2.8* 2.8* 2.8*  --   --   CRP 12.2*  --   --   --   --   --   --   --  17.0*  --  10.5* 11.5* 10.2*  --   --   DDIMER 1.81*  --   --   --   --   --   --   --  1.61*  --  1.31* 1.11*  --  1.39*  --   PROCALCITON 0.46  --   --   --   --   --   --   --   --   --   --   --   --   --  0.26  LATICACIDVEN  --  1.7 1.6  --   --   --   --   --   --   --   --   --   --   --   --   HGBA1C  --   --   --   --  8.2*  --   --   --   --   --   --   --   --   --   --    < > = values in this interval not displayed.    ------------------------------------------------------------------------------------------------------------------ No results for input(s): CHOL, HDL, LDLCALC, TRIG, CHOLHDL, LDLDIRECT in the last 72 hours.  Lab Results  Component Value Date   HGBA1C 8.2 (H) 08/19/2020   ------------------------------------------------------------------------------------------------------------------ No  results for input(s): TSH, T4TOTAL, T3FREE, THYROIDAB in the last 72 hours.  Invalid input(s): FREET3  Cardiac Enzymes No results for input(s): CKMB, TROPONINI, MYOGLOBIN in the last 168 hours.  Invalid input(s):  CK ------------------------------------------------------------------------------------------------------------------ No results found for: BNP  Micro Results Recent Results (from the past 240 hour(s))  Blood Culture (routine x 2)     Status: None (Preliminary result)   Collection Time: 08/18/20  8:56 PM   Specimen: BLOOD  Result Value Ref Range Status   Specimen Description BLOOD SITE NOT SPECIFIED  Final   Special Requests   Final    BOTTLES DRAWN AEROBIC ONLY Blood Culture results may not be optimal due to an inadequate volume of blood received in culture bottles   Culture   Final    NO GROWTH 4 DAYS Performed at [facility], [place] [address] [place], [place] [address]    Report Status PENDING  Incomplete  Blood Culture (routine x 2)     Status: None (Preliminary result)   Collection Time: 08/18/20  8:56 PM   Specimen: BLOOD  Result Value Ref Range Status   Specimen Description BLOOD SITE NOT SPECIFIED  Final   Special Requests   Final    BOTTLES DRAWN AEROBIC AND ANAEROBIC Blood Culture results may not be optimal due to an inadequate volume of blood received in culture bottles   Culture   Final    NO GROWTH 4 DAYS Performed at [facility], [place] [address], [place], [place] [address]    Report Status PENDING  Incomplete  Culture, Urine     Status: Abnormal   Collection Time: 08/19/20 10:07 AM   Specimen: Urine, Random  Result Value Ref Range Status   Specimen Description URINE, RANDOM  Final   Special Requests   Final    NONE Performed at [facility], [place] [address] [place], [place] [address]    Culture >=100,000 COLONIES/mL KLEBSIELLA PNEUMONIAE (A)  Final   Report Status 08/21/2020 FINAL  Final   Organism ID, Bacteria KLEBSIELLA PNEUMONIAE (A)  Final      Susceptibility   Klebsiella pneumoniae - MIC*    AMPICILLIN >=32 RESISTANT Resistant     CEFAZOLIN <=4 SENSITIVE Sensitive     CEFEPIME <=0.12 SENSITIVE Sensitive     CEFTRIAXONE <=0.25  SENSITIVE Sensitive     CIPROFLOXACIN <=0.25 SENSITIVE Sensitive     GENTAMICIN <=1 SENSITIVE Sensitive     IMIPENEM <=0.25 SENSITIVE Sensitive     NITROFURANTOIN 32 SENSITIVE Sensitive     TRIMETH/SULFA <=20 SENSITIVE Sensitive     AMPICILLIN/SULBACTAM 8 SENSITIVE Sensitive     PIP/TAZO <=4 SENSITIVE Sensitive     * >=100,000 COLONIES/mL KLEBSIELLA PNEUMONIAE  MRSA PCR Screening     Status: None   Collection Time: 08/20/20 12:22 AM   Specimen: Nasopharyngeal  Result Value Ref Range Status   MRSA by PCR NEGATIVE NEGATIVE Final    Comment:        The GeneXpert MRSA Assay (FDA approved for NASAL specimens only), is one component of a comprehensive MRSA colonization surveillance program. It is not intended to diagnose MRSA infection nor to guide or monitor treatment for MRSA infections. Performed at [facility], [place] [address], [place], [place] [address]     Radiology Reports US Renal Transplant w/Doppler  Result Date: 08/19/2020 CLINICAL DATA:  Acute kidney injury EXAM: ULTRASOUND OF RENAL TRANSPLANT WITH RENAL DOPPLER ULTRASOUND TECHNIQUE: Ultrasound examination of the renal transplant was performed  with gray-scale, color and duplex doppler evaluation. COMPARISON:  None. FINDINGS: Transplant kidney location: Left lower quadrant Transplant Kidney: Renal measurements: 10.8 x 6.2 x 6.5 cm = volume: 282mL. Normal in size and parenchymal echogenicity. No evidence of mass or hydronephrosis. No peri-transplant fluid collection seen. Color flow in the main renal artery:  Yes Color flow in the main renal vein:  Yes Duplex Doppler Evaluation: Main Renal Artery Velocity:  cm/sec Main Renal Artery Resistive Index: 0.99 Venous waveform in main renal vein:  Present Intrarenal resistive index in upper pole:  0.98 (normal 0.6-0.8; equivocal 0.8-0.9; abnormal >= 0.9) Intrarenal resistive index in lower pole: 0.98 (normal 0.6-0.8; equivocal 0.8-0.9; abnormal >= 0.9) Bladder: Normal for degree of  bladder distention. Other findings:  None. IMPRESSION: Elevated resistive indices within the left lower quadrant transplant kidney. No hydronephrosis. Electronically Signed   By: Rolm Baptise M.D.   On: 08/19/2020 19:15   DG Chest Portable 1 View  Result Date: 08/18/2020 CLINICAL DATA:  65 year old female with history of COVID infection. Chest pain. Shortness of breath. EXAM: PORTABLE CHEST 1 VIEW COMPARISON:  Chest x-ray 10/20/2014. FINDINGS: Patchy multifocal airspace consolidation and areas of interstitial prominence are noted throughout the lungs bilaterally, most severe throughout the periphery of the mid to lower left lung, indicative of multilobar bilateral pneumonia. No definite pleural effusions. No evidence of pulmonary edema. No pneumothorax. Heart size is borderline enlarged. Upper mediastinal contours are within normal limits. Atherosclerotic calcifications in the thoracic aorta. Vascular stent projecting over the right subclavian region. IMPRESSION: 1. Severe multilobar bilateral (left greater than right) pneumonia compatible with reported COVID infection. 2. Aortic atherosclerosis. Electronically Signed   By: Vinnie Langton M.D.   On: 08/18/2020 18:02

## 2020-08-23 NOTE — Evaluation (Addendum)
Occupational Therapy Evaluation Patient Details Name: [name] [name] MRN: [id number] DOB: 09/29/1955 Today's Date: 08/23/2020    History of Present Illness Pt is a 65 y.o. female who tested (+) COVID-19 on 08/13/20, now admitted 08/18/20 with worsening SOB, cough, chest pain and body aches. Workup for acute hypoxic respiratory failure due to COVID-19 PNA, DKA, AKI on CKD, UTI. PMH includes HTN, DM2, chronic steroid use, s/p renal transplant x2.   Clinical Impression   This 65 y/o female presents with the above. PTA pt reports living alone and performing ADL and functional mobility independently. Pt currently presenting with the above and below listed deficits. She currently requires minA for functional transfers (limited to stand pivot and few steps to recliner today), requiring up to maxA for toileting ADL. Pt on HHFNC during session (25L, 70%), difficult to maintain good wave pleth during much of session, however lowest noted SpO2 in the low 80s, given seated rest and increased time SpO2 returning to the mid 90s-100% (rebounds within approx 2 min). Pt notably fatigued with minimal activity but is motivated to return to her PLOF. She will benefit from continued acute OT services and currently recommend follow up therapy services at SNF setting to maximize her overall safety and independence with ADL and mobility (pending progress, may progress to home with [facility]). Pt did report her one daughter could likely arrange to stay with her initially at time of return home. Acute OT to follow.     Follow Up Recommendations  SNF;Supervision/Assistance - 24 hour;Other (comment) (pending progress, may can go home with [facility])    Equipment Recommendations  3 in 1 bedside commode           Precautions / Restrictions Precautions Precautions: Fall;Other (comment) Precaution Comments: Watch SpO2 on HHFNC Restrictions Weight Bearing Restrictions: No      Mobility Bed Mobility Overal bed mobility: Needs  Assistance Bed Mobility: Supine to Sit     Supine to sit: Min assist     General bed mobility comments: assist for scooting hips with pt demonstrating good use of UEs to self assist    Transfers Overall transfer level: Needs assistance Equipment used: 1 person hand held assist Transfers: Sit to/from Stand;Stand Pivot Transfers Sit to Stand: Min assist Stand pivot transfers: Min assist       General transfer comment: VCs for hand placement with assist to boost to standing and to steady once upright. pt with preference for UE support. stand pivot to BSC from EOB and then taking few steps to recliner    Balance Overall balance assessment: Needs assistance Sitting-balance support: Feet supported Sitting balance-Leahy Scale: Fair     Standing balance support: Single extremity supported;Bilateral upper extremity supported Standing balance-Leahy Scale: Poor Standing balance comment: Reliant on UE support                           ADL either performed or assessed with clinical judgement   ADL Overall ADL's : Needs assistance/impaired Eating/Feeding: Modified independent;Sitting   Grooming: Set up;Supervision/safety;Sitting   Upper Body Bathing: Supervision/ safety;Set up;Sitting   Lower Body Bathing: Moderate assistance;Sit to/from stand   Upper Body Dressing : Set up;Sitting   Lower Body Dressing: Moderate assistance;Sit to/from stand   Toilet Transfer: Minimal assistance;Stand-pivot;BSC   Toileting- Clothing Manipulation and Hygiene: Maximal assistance;Sit to/from stand Toileting - Clothing Manipulation Details (indicate cue type and reason): assist for posterior pericare after BM     Functional mobility during ADLs:  Minimal assistance       Vision         Perception     Praxis      Pertinent Vitals/Pain Pain Assessment: Faces Faces Pain Scale: Hurts little more Pain Location: generalized, with coughing Pain Descriptors / Indicators:  Discomfort Pain Intervention(s): Monitored during session     Hand Dominance     Extremity/Trunk Assessment Upper Extremity Assessment Upper Extremity Assessment: Generalized weakness   Lower Extremity Assessment Lower Extremity Assessment: Defer to PT evaluation       Communication Communication Communication: No difficulties   Cognition Arousal/Alertness: Awake/alert Behavior During Therapy: WFL for tasks assessed/performed;Flat affect Overall Cognitive Status: Impaired/Different from baseline Area of Impairment: Problem solving                             Problem Solving: Slow processing;Requires verbal cues General Comments: Attribute apparent cognitive impairment to likely fatigue and "covid fog"   General Comments       Exercises     Shoulder Instructions      Home Living Family/patient expects to be discharged to:: Private residence Living Arrangements: Alone Available Help at Discharge: Family;Available PRN/intermittently Type of Home: House Home Access: Level entry     Home Layout: Two level;Bed/[name] upstairs Alternate Level Stairs-Number of Steps: Flight Alternate Level Stairs-Rails: Right Bathroom Shower/Tub: Teacher, early years/pre: Standard     Home Equipment: None   Additional Comments: 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 Husband passed away in 2023/03/09. Daughters live nearby but one works and taking classes, the other works and has young Optician, dispensing      Prior Functioning/Environment Level of Independence: Independent        Comments: Retired from work. Has had harder time managing household since husband passed away. Enjoys time with daughters and grandkids        OT Problem List: Decreased strength;Decreased range of motion;Decreased activity tolerance;Impaired balance (sitting and/or standing);Decreased knowledge of use of DME or AE;Cardiopulmonary status limiting activity      OT  Treatment/Interventions: Self-care/ADL training;Therapeutic exercise;Energy conservation;DME and/or AE instruction;Therapeutic activities;Cognitive remediation/compensation;Patient/family education;Balance training    OT Goals(Current goals can be found in the care plan section) Acute Rehab OT Goals Patient Stated Goal: Hopeful for return home, but willing to consider post-acute rehab at SNF OT Goal Formulation: With patient Time For Goal Achievement: 09/06/20 Potential to Achieve Goals: Good  OT Frequency: Min 2X/week   Barriers to D/C:            Co-evaluation              AM-PAC OT "6 Clicks" Daily Activity     Outcome Measure Help from another person eating meals?: None Help from another person taking care of personal grooming?: A Little Help from another person toileting, which includes using toliet, bedpan, or urinal?: A Lot Help from another person bathing (including washing, rinsing, drying)?: A Lot Help from another person to put on and taking off regular upper body clothing?: A Little Help from another person to put on and taking off regular lower body clothing?: A Lot 6 Click Score: 16   End of Session Equipment Utilized During Treatment: Oxygen Nurse Communication: Mobility status  Activity Tolerance: Patient tolerated treatment well Patient left: in chair;with call bell/phone within reach  OT Visit Diagnosis: Muscle weakness (generalized) (M62.81);Unsteadiness on feet (R26.81)  Time: 2257-5051 OT Time Calculation (min): 38 min Charges:  OT General Charges $OT Visit: 1 Visit OT Evaluation $OT Eval Moderate Complexity: 1 Mod OT Treatments $Self Care/Home Management : 23-37 mins  Lou Cal, OT Acute Rehabilitation Services Pager [phone] Office [place] 08/23/2020, 1:42 PM

## 2020-08-23 NOTE — Progress Notes (Signed)
Pt o2 saturation 72% on 20L/60% Heated HFNC. Pt laying on right side.  Repositioned patient to prone position. Respiratory present and increased oxygen to 25liters and 75% FIO2 Heated HFNC. Oxygen saturation now at 94%. Pt stating she can breath better. Will continue to monitor.

## 2020-08-24 ENCOUNTER — Inpatient Hospital Stay (HOSPITAL_COMMUNITY): Payer: HMO

## 2020-08-24 DIAGNOSIS — N179 Acute kidney failure, unspecified: Secondary | ICD-10-CM | POA: Diagnosis not present

## 2020-08-24 DIAGNOSIS — J9601 Acute respiratory failure with hypoxia: Secondary | ICD-10-CM | POA: Diagnosis not present

## 2020-08-24 DIAGNOSIS — U071 COVID-19: Secondary | ICD-10-CM | POA: Diagnosis not present

## 2020-08-24 DIAGNOSIS — E1010 Type 1 diabetes mellitus with ketoacidosis without coma: Secondary | ICD-10-CM | POA: Diagnosis not present

## 2020-08-24 LAB — COMPREHENSIVE METABOLIC PANEL
ALT: 16 U/L (ref 0–44)
AST: 24 U/L (ref 15–41)
Albumin: 2.8 g/dL — ABNORMAL LOW (ref 3.5–5.0)
Alkaline Phosphatase: 66 U/L (ref 38–126)
Anion gap: 12 (ref 5–15)
BUN: 75 mg/dL — ABNORMAL HIGH (ref 8–23)
CO2: 26 mmol/L (ref 22–32)
Calcium: 9.9 mg/dL (ref 8.9–10.3)
Chloride: 103 mmol/L (ref 98–111)
Creatinine, Ser: 2.08 mg/dL — ABNORMAL HIGH (ref 0.44–1.00)
GFR, Estimated: 26 mL/min — ABNORMAL LOW (ref >60)
Glucose, Bld: 64 mg/dL — ABNORMAL LOW (ref 70–99)
Potassium: 4.4 mmol/L (ref 3.5–5.1)
Sodium: 141 mmol/L (ref 135–145)
Total Bilirubin: 0.9 mg/dL (ref 0.3–1.2)
Total Protein: 6.8 g/dL (ref 6.5–8.1)

## 2020-08-24 LAB — CBC WITH DIFFERENTIAL/PLATELET
Abs Immature Granulocytes: 0.16 10*3/uL — ABNORMAL HIGH (ref 0.00–0.07)
Basophils Absolute: 0 10*3/uL (ref 0.0–0.1)
Basophils Relative: 0 %
Eosinophils Absolute: 0 10*3/uL (ref 0.0–0.5)
Eosinophils Relative: 0 %
HCT: 33.9 % — ABNORMAL LOW (ref 36.0–46.0)
Hemoglobin: 11.4 g/dL — ABNORMAL LOW (ref 12.0–15.0)
Immature Granulocytes: 1 %
Lymphocytes Relative: 3 %
Lymphs Abs: 0.3 10*3/uL — ABNORMAL LOW (ref 0.7–4.0)
MCH: 25.1 pg — ABNORMAL LOW (ref 26.0–34.0)
MCHC: 33.6 g/dL (ref 30.0–36.0)
MCV: 74.7 fL — ABNORMAL LOW (ref 80.0–100.0)
Monocytes Absolute: 0.5 10*3/uL (ref 0.1–1.0)
Monocytes Relative: 4 %
Neutro Abs: 10.4 10*3/uL — ABNORMAL HIGH (ref 1.7–7.7)
Neutrophils Relative %: 92 %
Platelets: 144 10*3/uL — ABNORMAL LOW (ref 150–400)
RBC: 4.54 MIL/uL (ref 3.87–5.11)
RDW: 14.4 % (ref 11.5–15.5)
WBC: 11.3 10*3/uL — ABNORMAL HIGH (ref 4.0–10.5)
nRBC: 0 % (ref 0.0–0.2)

## 2020-08-24 LAB — GLUCOSE, CAPILLARY
Glucose-Capillary: 82 mg/dL (ref 70–99)
Glucose-Capillary: 113 mg/dL — ABNORMAL HIGH (ref 70–99)
Glucose-Capillary: 202 mg/dL — ABNORMAL HIGH (ref 70–99)
Glucose-Capillary: 265 mg/dL — ABNORMAL HIGH (ref 70–99)

## 2020-08-24 LAB — D-DIMER, QUANTITATIVE: D-Dimer, Quant: 2.34 ug/mL-FEU — ABNORMAL HIGH (ref 0.00–0.50)

## 2020-08-24 LAB — C-REACTIVE PROTEIN: CRP: 7.4 mg/dL — ABNORMAL HIGH (ref <1.0)

## 2020-08-24 MED ORDER — BARICITINIB 1 MG PO TABS
1.0000 mg | ORAL_TABLET | Freq: Every day | ORAL | Status: AC
  Administered 2020-08-24 – 2020-09-06 (×14): 1 mg via ORAL
  Filled 2020-08-24 (×14): qty 1

## 2020-08-24 MED ORDER — INSULIN DETEMIR 100 UNIT/ML ~~LOC~~ SOLN
10.0000 [IU] | Freq: Every day | SUBCUTANEOUS | Status: DC
  Administered 2020-08-24 – 2020-08-25 (×2): 10 [IU] via SUBCUTANEOUS
  Filled 2020-08-24 (×3): qty 0.1

## 2020-08-24 MED ORDER — INSULIN DETEMIR 100 UNIT/ML ~~LOC~~ SOLN
15.0000 [IU] | Freq: Every day | SUBCUTANEOUS | Status: DC
  Administered 2020-08-24 – 2020-08-26 (×3): 15 [IU] via SUBCUTANEOUS
  Filled 2020-08-24 (×3): qty 0.15

## 2020-08-24 MED ORDER — NEPRO/CARBSTEADY PO LIQD
237.0000 mL | Freq: Two times a day (BID) | ORAL | Status: DC
  Administered 2020-08-24: 120 mL via ORAL
  Administered 2020-08-24 – 2020-10-10 (×49): 237 mL via ORAL
  Filled 2020-08-24: qty 237

## 2020-08-24 MED ORDER — AMLODIPINE BESYLATE 5 MG PO TABS
5.0000 mg | ORAL_TABLET | Freq: Once | ORAL | Status: AC
  Administered 2020-08-24: 5 mg via ORAL
  Filled 2020-08-24: qty 1

## 2020-08-24 MED ORDER — AMLODIPINE BESYLATE 10 MG PO TABS
10.0000 mg | ORAL_TABLET | Freq: Every day | ORAL | Status: DC
  Administered 2020-08-25 – 2020-10-13 (×50): 10 mg via ORAL
  Filled 2020-08-24 (×25): qty 1
  Filled 2020-08-24: qty 2
  Filled 2020-08-24 (×24): qty 1

## 2020-08-24 MED ORDER — GLUCERNA SHAKE PO LIQD
237.0000 mL | Freq: Three times a day (TID) | ORAL | Status: DC
  Administered 2020-08-24 – 2020-08-27 (×8): 237 mL via ORAL

## 2020-08-24 NOTE — Progress Notes (Signed)
PROGRESS NOTE                                                                             PROGRESS NOTE                                                                                                                                                                                                             Patient Demographics:    [name] [name], is a 65 y.o. female, DOB - 05-17-1956, ZDG:[id number]  Outpatient Primary MD for the patient is Glendale Chard, MD    LOS - 5  Admit date - 08/18/2020    Chief Complaint  Patient presents with  . Covid Positive  . Chest Pain  . Weakness       Brief Narrative    [name] [name] is a 65 yo F with history of renal transplant x2 (latest in 2016) on chronic immunosuppression including Myfortic, Prograf and steroids, type 2 diabetes mellitus , hypothyroidism, and anemia. -Patient presents to ED due to worsening dyspnea, cough, generalized body ache and fatigue, patient with recent positive test on 1/5, she is with significant hypoxia, admitted initially to ICU on heated high flow nasal cannula, she was transferred to triage service 1/14, she is being followed by nephrology service due to worsening renal function with metabolic acidosis and hyperkalemia.    Subjective:    [name] [name] today ports her diarrhea has improved, still report generalized weakness, fatigue, as well reports dyspnea and cough.    Assessment  & Plan :    Principal Problem:   ARF (acute renal failure) (HCC) Active Problems:   AKI (acute kidney injury) ([place])   Renal transplant recipient   Immunocompromised ([place])   DM (diabetes mellitus), type 2 with renal complications ([place])   Essential hypertension with goal blood pressure less than 140/90   Acute hypoxemic respiratory failure due to COVID-19 ([place])   Respiratory failure (HCC)   Acute hyperkalemia   Diabetic ketoacidosis without coma associated with type 1 diabetes  mellitus ([place])  Pressure injury of skin  Acute Hypoxic Resp. Failure due to Acute Covid 19 Viral Pneumonitis during the ongoing 2020 Covid 19 Pandemic  -Fully vaccinated with [employer] x2, and booster. -Unfortunately she did develop severe COVID-19 pneumonia given her chronic immune suppressed status due to her renal transplant. -CT chest without contrast obtained 1/16 given persistent severe hypoxia, significant for diffuse groundglass multifocal opacity -Continue with IV steroids, currently on IV Solu-Medrol. -Continue with remdesivir. -I have discussed baricitinib with the patient, as well discussed with renal, at this point no contraindication of starting it especially we are holding Myfortic , patient denies any history of tuberculosis, diverticulitis or viral hepatitis . -Continue to trend inflammatory markers -Procalcitonin trending down which is reassuring  Encouraged the patient to sit up in chair in the daytime use I-S and flutter valve for pulmonary toiletry and then prone in bed when at night.  Will advance activity and titrate down oxygen as possible.     SpO2: 96 % O2 Flow Rate (L/min): 30 L/min FiO2 (%): 80 %  Recent Labs  Lab 08/18/20 2030 08/18/20 2128 08/18/20 2230 08/19/20 0216 08/20/20 0135 08/21/20 0034 08/21/20 0118 08/22/20 0129 08/23/20 0314 08/23/20 0609 08/23/20 1039 08/24/20 0246  WBC  --   --   --    < > 6.1  --  9.4 7.6  --  5.7  --  11.3*  PLT  --   --   --    < > 96*  --  121* 120*  --  145*  --  144*  CRP 12.2*  --   --   --  17.0* 10.5*  --  11.5* 10.2*  --   --  7.4*  DDIMER 1.81*  --   --   --  1.61* 1.31*  --  1.11*  --  1.39*  --  2.34*  PROCALCITON 0.46  --   --   --   --   --   --   --   --   --  0.26  --   AST  --   --   --   --  19 21  --  24 27  --   --  24  ALT  --   --   --   --  14 11  --  15 12  --   --  16  ALKPHOS  --   --   --   --  51 49  --  57 67  --   --  66  BILITOT  --   --   --   --  0.5 0.7  --  0.5 0.7  --   --  0.9   ALBUMIN  --   --   --    < > 3.0* 2.8*  --  2.8* 2.8*  --   --  2.8*  LATICACIDVEN  --  1.7 1.6  --   --   --   --   --   --   --   --   --    < > = values in this interval not displayed.    FiO2 (%):  [80 %] 80 %  ABG     Component Value Date/Time   PHART 7.266 (L) 08/19/2020 0808   PCO2ART 32.3 08/19/2020 0808   PO2ART 186 (H) 08/19/2020 0808   HCO3 19.0 (L) 08/20/2020 1101   TCO2 16 (L) 08/19/2020 0808   ACIDBASEDEF 6.4 (H) 08/20/2020 1101  O2SAT 85.1 08/20/2020 1101     AKI on CKD,  -Renal input greatly appreciated. -Currently off bicarb drip.  Bicarb is 21 this morning, she remains on p.o. sodium bicarb -Patient with history renal transplant, immunosuppression therapy management per renal. -No further hyperkalemia, Lokelma stopped  Hx renal transplant x 2 (last 2016)  - on mycophenolate, prograf, steroids. -Immunosuppression medication management per renal, Myfortic currently on hold  Diabetes mellitus, type II, poorly controlled with DKA  -Resolved, off insulin drip  -Mild, with low CBGs, so we will decrease her Levemir evening time to 10 units in order to avoid hypoglycemia, will have to tolerate some elevated reading, continue with insulin sliding scale.    Hypertension - Continue Amlodipine, Lopressor, remains elevated will increase Norvasc to 10 mg p.o. daily. - prn labetalol   Hypothyroidism -Continue home levothyroxine.  Klebsiella UTI  - continue ceftriaxone   Condition - Extremely Guarded  Family communication:  D/W daughter by phone daily  Code Status :  Full  Consults  :  Renal,PCCM  Procedures  :  none  Disposition Plan  :    Status is: Inpatient  Remains inpatient appropriate because:IV treatments appropriate due to intensity of illness or inability to take PO   Dispo: The patient is from: Home              Anticipated d/c is to: Home              Anticipated d/c date is: > 3 days              Patient currently is not medically  stable to d/c.      DVT Prophylaxis  :   Heparin  Lab Results  Component Value Date   PLT 144 (L) 08/24/2020    Diet :  Diet Order            Diet renal/carb modified with fluid restriction Diet-HS Snack? Nothing; Fluid restriction: 1200 mL Fluid; Room service appropriate? Yes; Fluid consistency: Thin  Diet effective now                  Inpatient Medications  Scheduled Meds: . amLODipine  5 mg Oral Daily  . vitamin C  500 mg Oral Daily  . aspirin EC  81 mg Oral Daily  . baricitinib  1 mg Oral Daily  . Chlorhexidine Gluconate Cloth  6 each Topical Daily  . feeding supplement (NEPRO CARB STEADY)  237 mL Oral BID BM  . fluticasone  1 spray Each Nare Daily  . Gerhardt's butt cream   Topical Daily  . heparin  5,000 Units Subcutaneous Q8H  . insulin aspart  0-15 Units Subcutaneous TID WC  . insulin aspart  4 Units Subcutaneous TID WC  . insulin detemir  10 Units Subcutaneous QHS  . insulin detemir  15 Units Subcutaneous Daily  . levothyroxine  75 mcg Oral Q0600  . mouth rinse  15 mL Mouth Rinse BID  . methylPREDNISolone (SOLU-MEDROL) injection  60 mg Intravenous Q12H  . metoprolol tartrate  75 mg Oral BID  . pantoprazole  40 mg Oral Daily  . simvastatin  10 mg Oral q1800  . sodium bicarbonate  650 mg Oral TID  . tacrolimus  3 mg Oral QHS  . tacrolimus  4 mg Oral Daily  . zinc sulfate  220 mg Oral Daily   Continuous Infusions: . sodium chloride    . cefTRIAXone (ROCEPHIN)  IV 1 g (08/23/20 2130)   PRN  Meds:.sodium chloride, acetaminophen **OR** acetaminophen, docusate sodium, guaiFENesin, hydrALAZINE, labetalol, loperamide, polyethylene glycol  Antibiotics  :    Anti-infectives (From admission, onward)   Start     Dose/Rate Route Frequency Ordered Stop   08/20/20 1445  cefTRIAXone (ROCEPHIN) 1 g in sodium chloride 0.9 % 100 mL IVPB        1 g 200 mL/hr over 30 Minutes Intravenous Every 24 hours 08/20/20 1348 08/27/20 2159   08/20/20 1000  remdesivir 100 mg in  sodium chloride 0.9 % 100 mL IVPB       "Followed by" Linked Group Details   100 mg 200 mL/hr over 30 Minutes Intravenous Daily 08/19/20 0021 08/23/20 1113   08/19/20 0130  remdesivir 200 mg in sodium chloride 0.9% 250 mL IVPB       "Followed by" Linked Group Details   200 mg 580 mL/hr over 30 Minutes Intravenous Once 08/19/20 0021 08/19/20 0314        Emeline Gins Elgergawy M.D on 08/24/2020 at 3:56 PM  To page go to [email or web address] -  [facility] -  Office  [phone]       Objective:   Vitals:   08/24/20 0819 08/24/20 0914 08/24/20 1240 08/24/20 1300  BP:   (!) 184/87   Pulse:  76 72   Resp:   20   Temp:   98.1 F (36.7 C)   TempSrc:   Oral   SpO2: 90%  98% 96%  Weight:      Height:        Wt Readings from Last 3 Encounters:  08/21/20 88.6 kg  05/14/20 92.5 kg  05/14/20 92.9 kg     Intake/Output Summary (Last 24 hours) at 08/24/2020 1556 Last data filed at 08/24/2020 1520 Gross per 24 hour  Intake 480 ml  Output 1600 ml  Net -1120 ml     Physical Exam  Awake Alert, Oriented X 3, frail, no new F.N deficits, Normal affect Symmetrical Chest wall movement, Good air movement bilaterally,scattered rales RRR,No Gallops,Rubs or new Murmurs, No Parasternal Heave +ve B.Sounds, Abd Soft, No tenderness, No rebound - guarding or rigidity. No Cyanosis, Clubbing or edema, No new Rash or bruise        Data Review:    CBC Recent Labs  Lab 08/20/20 0135 08/21/20 0118 08/22/20 0129 08/23/20 0609 08/24/20 0246  WBC 6.1 9.4 7.6 5.7 11.3*  HGB 11.1* 11.6* 10.9* 10.8* 11.4*  HCT 33.4* 35.2* 33.6* 33.2* 33.9*  PLT 96* 121* 120* 145* 144*  MCV 73.9* 74.7* 74.7* 75.5* 74.7*  MCH 24.6* 24.6* 24.2* 24.5* 25.1*  MCHC 33.2 33.0 32.4 32.5 33.6  RDW 14.4 14.4 14.5 14.3 14.4  LYMPHSABS 0.3* 0.4* 0.3* 0.3* 0.3*  MONOABS 0.2 0.5 0.5 0.2 0.5  EOSABS 0.0 0.0 0.0 0.0 0.0  BASOSABS 0.0 0.0 0.0 0.0 0.0    Recent Labs  Lab 08/18/20 2030 08/18/20 2128  08/18/20 2230 08/19/20 0216 08/19/20 0217 08/19/20 0808 08/20/20 0135 08/20/20 1101 08/21/20 0034 08/22/20 0129 08/23/20 0314 08/23/20 0609 08/23/20 1039 08/24/20 0246  NA  --   --   --   --   --    < > 138 137 140 145 140  --   --  141  K  --   --   --   --   --    < > 5.7* 5.3* 4.8 4.5 4.8  --   --  4.4  CL  --   --   --   --   --    < >  108 106 105 106 104  --   --  103  CO2  --   --   --   --   --    < > 16* 17* 22 25 21*  --   --  26  GLUCOSE  --   --   --   --   --    < > 281* 160* 220* 122* 177*  --   --  64*  BUN  --   --   --   --   --    < > 100* 102* 97* 88* 83*  --   --  75*  CREATININE  --   --   --    < >  --    < > 3.82* 3.43* 3.05* 2.44* 2.25*  --   --  2.08*  CALCIUM  --   --   --   --   --    < > 10.1 9.8 9.5 9.7 9.6  --   --  9.9  AST  --   --   --   --   --   --  19  --  21 24 27   --   --  24  ALT  --   --   --   --   --   --  14  --  11 15 12   --   --  16  ALKPHOS  --   --   --   --   --   --  51  --  49 57 67  --   --  66  BILITOT  --   --   --   --   --   --  0.5  --  0.7 0.5 0.7  --   --  0.9  ALBUMIN  --   --   --   --   --    < > 3.0*  --  2.8* 2.8* 2.8*  --   --  2.8*  CRP 12.2*  --   --   --   --   --  17.0*  --  10.5* 11.5* 10.2*  --   --  7.4*  DDIMER 1.81*  --   --   --   --   --  1.61*  --  1.31* 1.11*  --  1.39*  --  2.34*  PROCALCITON 0.46  --   --   --   --   --   --   --   --   --   --   --  0.26  --   LATICACIDVEN  --  1.7 1.6  --   --   --   --   --   --   --   --   --   --   --   HGBA1C  --   --   --   --  8.2*  --   --   --   --   --   --   --   --   --    < > = values in this interval not displayed.    ------------------------------------------------------------------------------------------------------------------ No results for input(s): CHOL, HDL, LDLCALC, TRIG, CHOLHDL, LDLDIRECT in the last 72 hours.  Lab Results  Component Value Date   HGBA1C 8.2 (H) 08/19/2020    ------------------------------------------------------------------------------------------------------------------ No results for input(s): TSH, T4TOTAL, T3FREE, THYROIDAB in the last 72 hours.  Invalid input(s): FREET3  Cardiac Enzymes No results for input(s): CKMB, TROPONINI, MYOGLOBIN in the last 168 hours.  Invalid input(s): CK ------------------------------------------------------------------------------------------------------------------ No results found for: BNP  Micro Results Recent Results (from the past 240 hour(s))  Blood Culture (routine x 2)     Status: None   Collection Time: 08/18/20  8:56 PM   Specimen: BLOOD  Result Value Ref Range Status   Specimen Description BLOOD SITE NOT SPECIFIED  Final   Special Requests   Final    BOTTLES DRAWN AEROBIC ONLY Blood Culture results may not be optimal due to an inadequate volume of blood received in culture bottles   Culture   Final    NO GROWTH 5 DAYS Performed at [facility], [place] [address] [place], [place] [address]    Report Status 08/23/2020 FINAL  Final  Blood Culture (routine x 2)     Status: None   Collection Time: 08/18/20  8:56 PM   Specimen: BLOOD  Result Value Ref Range Status   Specimen Description BLOOD SITE NOT SPECIFIED  Final   Special Requests   Final    BOTTLES DRAWN AEROBIC AND ANAEROBIC Blood Culture results may not be optimal due to an inadequate volume of blood received in culture bottles   Culture   Final    NO GROWTH 5 DAYS Performed at [facility], [place] [address] [place], [place] [address]    Report Status 08/23/2020 FINAL  Final  Culture, Urine     Status: Abnormal   Collection Time: 08/19/20 10:07 AM   Specimen: Urine, Random  Result Value Ref Range Status   Specimen Description URINE, RANDOM  Final   Special Requests   Final    NONE Performed at [facility], [place] [address], [place], [place] [address]    Culture >=100,000 COLONIES/mL KLEBSIELLA PNEUMONIAE (A)   Final   Report Status 08/21/2020 FINAL  Final   Organism ID, Bacteria KLEBSIELLA PNEUMONIAE (A)  Final      Susceptibility   Klebsiella pneumoniae - MIC*    AMPICILLIN >=32 RESISTANT Resistant     CEFAZOLIN <=4 SENSITIVE Sensitive     CEFEPIME <=0.12 SENSITIVE Sensitive     CEFTRIAXONE <=0.25 SENSITIVE Sensitive     CIPROFLOXACIN <=0.25 SENSITIVE Sensitive     GENTAMICIN <=1 SENSITIVE Sensitive     IMIPENEM <=0.25 SENSITIVE Sensitive     NITROFURANTOIN 32 SENSITIVE Sensitive     TRIMETH/SULFA <=20 SENSITIVE Sensitive     AMPICILLIN/SULBACTAM 8 SENSITIVE Sensitive     PIP/TAZO <=4 SENSITIVE Sensitive     * >=100,000 COLONIES/mL KLEBSIELLA PNEUMONIAE  MRSA PCR Screening     Status: None   Collection Time: 08/20/20 12:22 AM   Specimen: Nasopharyngeal  Result Value Ref Range Status   MRSA by PCR NEGATIVE NEGATIVE Final    Comment:        The GeneXpert MRSA Assay (FDA approved for NASAL specimens only), is one component of a comprehensive MRSA colonization surveillance program. It is not intended to diagnose MRSA infection nor to guide or monitor treatment for MRSA infections. Performed at [facility], [place] [address], [place], [place] [address]     Radiology Reports CT CHEST WO CONTRAST  Result Date: 08/24/2020 CLINICAL DATA:  Severe hypoxia and respiratory failure with COVID-19 infection. EXAM: CT CHEST WITHOUT CONTRAST TECHNIQUE: Multidetector CT imaging of the chest was performed following the standard protocol without IV contrast. COMPARISON:  Chest x-ray 08/18/2020 FINDINGS: Cardiovascular: Mild cardiomegaly. Mild calcification over  the mitral valve annulus. Thoracic aorta is normal caliber. Stent over the right subclavian/axillary region. Remaining vascular structures are unremarkable. Mediastinum/Nodes: No evidence of mediastinal or hilar adenopathy. Remaining mediastinal structures are unremarkable. Lungs/Pleura: Lungs are adequately inflated demonstrate moderate  bilateral hazy airspace process likely multifocal viral pneumonia in this COVID-19 positive patient. No evidence of effusion. Airways are otherwise unremarkable. Upper Abdomen: No acute findings. Musculoskeletal: Degenerative change of the spine. Mild anterior wedging of a midthoracic vertebral body likely chronic. IMPRESSION: 1. Moderate bilateral hazy airspace process likely multifocal viral pneumonia in this COVID-19 positive patient. 2. Mild cardiomegaly. 3. Mild anterior wedging of a midthoracic vertebral body likely chronic. Electronically Signed   By: Marin Olp M.D.   On: 08/24/2020 12:37   US Renal Transplant w/Doppler  Result Date: 08/19/2020 CLINICAL DATA:  Acute kidney injury EXAM: ULTRASOUND OF RENAL TRANSPLANT WITH RENAL DOPPLER ULTRASOUND TECHNIQUE: Ultrasound examination of the renal transplant was performed with gray-scale, color and duplex doppler evaluation. COMPARISON:  None. FINDINGS: Transplant kidney location: Left lower quadrant Transplant Kidney: Renal measurements: 10.8 x 6.2 x 6.5 cm = volume: 235mL. Normal in size and parenchymal echogenicity. No evidence of mass or hydronephrosis. No peri-transplant fluid collection seen. Color flow in the main renal artery:  Yes Color flow in the main renal vein:  Yes Duplex Doppler Evaluation: Main Renal Artery Velocity:  cm/sec Main Renal Artery Resistive Index: 0.99 Venous waveform in main renal vein:  Present Intrarenal resistive index in upper pole:  0.98 (normal 0.6-0.8; equivocal 0.8-0.9; abnormal >= 0.9) Intrarenal resistive index in lower pole: 0.98 (normal 0.6-0.8; equivocal 0.8-0.9; abnormal >= 0.9) Bladder: Normal for degree of bladder distention. Other findings:  None. IMPRESSION: Elevated resistive indices within the left lower quadrant transplant kidney. No hydronephrosis. Electronically Signed   By: Rolm Baptise M.D.   On: 08/19/2020 19:15   DG Chest Portable 1 View  Result Date: 08/18/2020 CLINICAL DATA:  65 year old female  with history of COVID infection. Chest pain. Shortness of breath. EXAM: PORTABLE CHEST 1 VIEW COMPARISON:  Chest x-ray 10/20/2014. FINDINGS: Patchy multifocal airspace consolidation and areas of interstitial prominence are noted throughout the lungs bilaterally, most severe throughout the periphery of the mid to lower left lung, indicative of multilobar bilateral pneumonia. No definite pleural effusions. No evidence of pulmonary edema. No pneumothorax. Heart size is borderline enlarged. Upper mediastinal contours are within normal limits. Atherosclerotic calcifications in the thoracic aorta. Vascular stent projecting over the right subclavian region. IMPRESSION: 1. Severe multilobar bilateral (left greater than right) pneumonia compatible with reported COVID infection. 2. Aortic atherosclerosis. Electronically Signed   By: Vinnie Langton M.D.   On: 08/18/2020 18:02

## 2020-08-24 NOTE — Progress Notes (Addendum)
S: HR Sinus tach 120's to 150's for about 30 minutes on the heart monitor and auscultated apical. Resolved with valsava maneuver and reclining in chair since patient says she felt tired. Other VS stable  R: MD notified. EKG showing NSR.

## 2020-08-24 NOTE — Progress Notes (Signed)
Nephrology Consult Follow Up Note [facility] Admit: 08/18/2020 LOS: 5  Subjective:   Due to the [place] pandemic, in attempts to limit transmission and over-utilization of resources (e.g. PPE), the patient was seen virtually today by means of chart review, discussion with staff, and virtual discussion with patient as needed.  No acute events. uop not measured   01/15 0701 - 01/16 0700 In: 360 [P.O.:360] Out: -   Filed Weights   08/20/20 0405 08/21/20 0453 08/21/20 1716  Weight: 88.5 kg 88.6 kg 88.6 kg    Scheduled Meds: . amLODipine  5 mg Oral Daily  . vitamin C  500 mg Oral Daily  . aspirin EC  81 mg Oral Daily  . Chlorhexidine Gluconate Cloth  6 each Topical Daily  . feeding supplement (NEPRO CARB STEADY)  237 mL Oral BID BM  . fluticasone  1 spray Each Nare Daily  . Gerhardt's butt cream   Topical Daily  . heparin  5,000 Units Subcutaneous Q8H  . insulin aspart  0-15 Units Subcutaneous TID WC  . insulin aspart  4 Units Subcutaneous TID WC  . insulin detemir  10 Units Subcutaneous QHS  . insulin detemir  15 Units Subcutaneous Daily  . levothyroxine  75 mcg Oral Q0600  . mouth rinse  15 mL Mouth Rinse BID  . methylPREDNISolone (SOLU-MEDROL) injection  60 mg Intravenous Q12H  . metoprolol tartrate  75 mg Oral BID  . pantoprazole  40 mg Oral Daily  . simvastatin  10 mg Oral q1800  . sodium bicarbonate  650 mg Oral TID  . tacrolimus  3 mg Oral QHS  . tacrolimus  4 mg Oral Daily  . zinc sulfate  220 mg Oral Daily   Continuous Infusions: . sodium chloride    . cefTRIAXone (ROCEPHIN)  IV 1 g (08/23/20 2130)   PRN Meds:.sodium chloride, acetaminophen **OR** acetaminophen, docusate sodium, guaiFENesin, hydrALAZINE, labetalol, loperamide, polyethylene glycol   Physical Exam:  Blood pressure (!) 142/74, pulse 76, temperature 97.7 F (36.5 C), temperature source Oral, resp. rate 20, height 5\' 2"  (1.575 m), weight 88.6 kg, SpO2 90 %. Not examined    Recent  Labs  Lab 08/19/20 1019 08/19/20 1336 08/22/20 0129 08/23/20 0314 08/24/20 0246  NA 135   < > 145 140 141  K 5.4*   < > 4.5 4.8 4.4  CL 109   < > 106 104 103  CO2 13*   < > 25 21* 26  GLUCOSE 398*   < > 122* 177* 64*  BUN 93*   < > 88* 83* 75*  CREATININE 3.84*   < > 2.44* 2.25* 2.08*  CALCIUM 10.0   < > 9.7 9.6 9.9  PHOS 4.1  --   --   --   --    < > = values in this interval not displayed.   Recent Labs  Lab 08/22/20 0129 08/23/20 0609 08/24/20 0246  WBC 7.6 5.7 11.3*  NEUTROABS 6.7 5.1 10.4*  HGB 10.9* 10.8* 11.4*  HCT 33.6* 33.2* 33.9*  MCV 74.7* 75.5* 74.7*  PLT 120* 145* 144*    Assessment:  [name] [name] is a 65 yo F with hx of DDRT x2 (latest 2016) on prograf, myfortic, and low-dose prednisone, T2DM, hypothyroidism, and recent diagnosis of COVID-19 presenting with acute hypoxic respiratory failure 2/2 COVID PNA. Also found to be in DKA with worsening AoCKD and hyperkalemia on labs.  Assessment:  1. AKI on CKD, improving. Non-oliguric. Peak Cr 4  2.  H/O DDKT x 2 (latest 2016). KDPI 90%, PRA 24%. Course complicated by acute pyelonephritis 2016. Baseline Cr 1.3-1.7.Follows w/ Dr. Marval Regal ([facility]) & [facility]  3. Chronic immunosuppression  4. AHRF secondary COVID-19 PNA  5. DKA, h/o uncontrolled DM2w/ hyperglycemia, resolved  6. Hyperkalemia, resolved. Off lokelma  7. Anion Gap Metabolic acidosis, resolved/stable 8. HTN  9. Klebsiella UTI, on rocephin  Plan:  -Cr down to 2, need better uop monitoring, continue to monitor labs -will continue to hold MMF, is otherwise having added benefit/protection w/ steroids -supratherpeutic trough on 1/10 (9.4), repeat trough ordered 1/14, pending -targeting tac goal 5-7 -continue with reduced dose tacrolimus -ensure she is hydrating to thirst -has turned a corner from a renal aspect, fortunately did not require renal replacement therapy thus far  Gean Quint, MD  [facility] 08/24/2020, 12:07 PM

## 2020-08-25 DIAGNOSIS — J9601 Acute respiratory failure with hypoxia: Secondary | ICD-10-CM | POA: Diagnosis not present

## 2020-08-25 DIAGNOSIS — U071 COVID-19: Secondary | ICD-10-CM | POA: Diagnosis not present

## 2020-08-25 DIAGNOSIS — N179 Acute kidney failure, unspecified: Secondary | ICD-10-CM | POA: Diagnosis not present

## 2020-08-25 LAB — CBC WITH DIFFERENTIAL/PLATELET
Abs Immature Granulocytes: 0.22 10*3/uL — ABNORMAL HIGH (ref 0.00–0.07)
Basophils Absolute: 0 10*3/uL (ref 0.0–0.1)
Basophils Relative: 0 %
Eosinophils Absolute: 0 10*3/uL (ref 0.0–0.5)
Eosinophils Relative: 0 %
HCT: 33.8 % — ABNORMAL LOW (ref 36.0–46.0)
Hemoglobin: 11.3 g/dL — ABNORMAL LOW (ref 12.0–15.0)
Immature Granulocytes: 3 %
Lymphocytes Relative: 4 %
Lymphs Abs: 0.4 10*3/uL — ABNORMAL LOW (ref 0.7–4.0)
MCH: 25.8 pg — ABNORMAL LOW (ref 26.0–34.0)
MCHC: 33.4 g/dL (ref 30.0–36.0)
MCV: 77.2 fL — ABNORMAL LOW (ref 80.0–100.0)
Monocytes Absolute: 0.4 10*3/uL (ref 0.1–1.0)
Monocytes Relative: 5 %
Neutro Abs: 7.6 10*3/uL (ref 1.7–7.7)
Neutrophils Relative %: 88 %
Platelets: 152 10*3/uL (ref 150–400)
RBC: 4.38 MIL/uL (ref 3.87–5.11)
RDW: 14.5 % (ref 11.5–15.5)
WBC: 8.6 10*3/uL (ref 4.0–10.5)
nRBC: 0 % (ref 0.0–0.2)

## 2020-08-25 LAB — COMPREHENSIVE METABOLIC PANEL
ALT: 15 U/L (ref 0–44)
AST: 17 U/L (ref 15–41)
Albumin: 2.7 g/dL — ABNORMAL LOW (ref 3.5–5.0)
Alkaline Phosphatase: 74 U/L (ref 38–126)
Anion gap: 13 (ref 5–15)
BUN: 69 mg/dL — ABNORMAL HIGH (ref 8–23)
CO2: 24 mmol/L (ref 22–32)
Calcium: 9.5 mg/dL (ref 8.9–10.3)
Chloride: 99 mmol/L (ref 98–111)
Creatinine, Ser: 1.88 mg/dL — ABNORMAL HIGH (ref 0.44–1.00)
GFR, Estimated: 29 mL/min — ABNORMAL LOW (ref >60)
Glucose, Bld: 316 mg/dL — ABNORMAL HIGH (ref 70–99)
Potassium: 4.3 mmol/L (ref 3.5–5.1)
Sodium: 136 mmol/L (ref 135–145)
Total Bilirubin: 0.8 mg/dL (ref 0.3–1.2)
Total Protein: 6.2 g/dL — ABNORMAL LOW (ref 6.5–8.1)

## 2020-08-25 LAB — D-DIMER, QUANTITATIVE: D-Dimer, Quant: 1.88 ug/mL-FEU — ABNORMAL HIGH (ref 0.00–0.50)

## 2020-08-25 LAB — PROCALCITONIN: Procalcitonin: 0.12 ng/mL

## 2020-08-25 LAB — GLUCOSE, CAPILLARY
Glucose-Capillary: 236 mg/dL — ABNORMAL HIGH (ref 70–99)
Glucose-Capillary: 178 mg/dL — ABNORMAL HIGH (ref 70–99)
Glucose-Capillary: 208 mg/dL — ABNORMAL HIGH (ref 70–99)
Glucose-Capillary: 242 mg/dL — ABNORMAL HIGH (ref 70–99)

## 2020-08-25 MED ORDER — HYDRALAZINE HCL 25 MG PO TABS
25.0000 mg | ORAL_TABLET | Freq: Three times a day (TID) | ORAL | Status: DC
  Administered 2020-08-25 – 2020-09-28 (×100): 25 mg via ORAL
  Filled 2020-08-25 (×102): qty 1

## 2020-08-25 MED ORDER — HEPARIN SODIUM (PORCINE) 5000 UNIT/ML IJ SOLN
7500.0000 [IU] | Freq: Three times a day (TID) | INTRAMUSCULAR | Status: DC
  Administered 2020-08-25 – 2020-08-31 (×19): 7500 [IU] via SUBCUTANEOUS
  Filled 2020-08-25 (×20): qty 2

## 2020-08-25 NOTE — Progress Notes (Signed)
[facility] Progress Note   65 yo F with hx of DDRT x2 (latest 2016) on prograf, myfortic, and low-dose prednisone, T2DM, hypothyroidism, and recent diagnosis of COVID-19 presenting with acute hypoxic respiratory failure 2/2 COVID PNA. Also found to be in DKA with worsening AoCKD and hyperkalemia on labs.  Assessment:    1. AKI on CKD, improving. Non-oliguric. Peak Cr 4 2. H/O DDKT x 2 (latest 2016). KDPI 90%, PRA 24%. Course complicated by acute pyelonephritis 2016. Baseline Cr 1.3-1.7.Follows w/ Dr. Marval Regal ([facility]) & [facility] 3. Chronic immunosuppression 4. AHRF secondary COVID-19 PNA 5. DKA, h/o uncontrolled DM2w/ hyperglycemia, resolved 6. Hyperkalemia, resolved. Off lokelma 7. Anion Gap Metabolic acidosis, resolved/stable 8. HTN 9. Klebsiella UTI, on rocephin  Plan: -Cr down to 1.88, great  Uop,  continue to monitor labs -will continue to hold MMF, is otherwise having added benefit/protection w/ steroids and also on O2. Will add Myfortic back once she's on lower O2 and off high dose steroids. -supratherpeutic trough on 1/10 (9.4), repeat trough ordered 1/14, pending -targeting tac goal 5-7 -continue with reduced dose tacrolimus (on 4mg /3mg ) -ensure she is hydrating to thirst -has turned a corner from a renal aspect, fortunately did not require renal replacement therapy thus far  Subjective:   On high flow and sats 92-94% but drops to high 80's with exertion. She still is dyspneic but better than at admission. +productive cough but denies f/c/n/v.   Objective:   BP (!) 149/74   Pulse 65   Temp 98 F (36.7 C) (Axillary)   Resp 20   Ht 5\' 2"  (1.575 m)   Wt 88 kg   SpO2 90%   BMI 35.48 kg/m   Intake/Output Summary (Last 24 hours) at 08/25/2020 1126 Last data filed at 08/25/2020 1004 Gross per 24 hour  Intake 1200 ml  Output 2500 ml  Net -1300 ml   Weight change:   Physical Exam: GEN: NAD, NCAT HEENT: No conjunctival  pallor, EOMI NECK: Supple, no thyromegaly LUNGS: B/L rhonchi CV: RRR, No M/R/G ABD: SNDNT +BS  EXT: No lower extremity edema ACCESS: rt BCF aneurysmal pulsatile   Imaging: CT CHEST WO CONTRAST  Result Date: 08/24/2020 CLINICAL DATA:  Severe hypoxia and respiratory failure with COVID-19 infection. EXAM: CT CHEST WITHOUT CONTRAST TECHNIQUE: Multidetector CT imaging of the chest was performed following the standard protocol without IV contrast. COMPARISON:  Chest x-ray 08/18/2020 FINDINGS: Cardiovascular: Mild cardiomegaly. Mild calcification over the mitral valve annulus. Thoracic aorta is normal caliber. Stent over the right subclavian/axillary region. Remaining vascular structures are unremarkable. Mediastinum/Nodes: No evidence of mediastinal or hilar adenopathy. Remaining mediastinal structures are unremarkable. Lungs/Pleura: Lungs are adequately inflated demonstrate moderate bilateral hazy airspace process likely multifocal viral pneumonia in this COVID-19 positive patient. No evidence of effusion. Airways are otherwise unremarkable. Upper Abdomen: No acute findings. Musculoskeletal: Degenerative change of the spine. Mild anterior wedging of a midthoracic vertebral body likely chronic. IMPRESSION: 1. Moderate bilateral hazy airspace process likely multifocal viral pneumonia in this COVID-19 positive patient. 2. Mild cardiomegaly. 3. Mild anterior wedging of a midthoracic vertebral body likely chronic. Electronically Signed   By: Marin Olp M.D.   On: 08/24/2020 12:37    Labs: BMET Recent Labs  Lab 08/19/20 1019 08/19/20 1336 08/20/20 0135 08/20/20 1101 08/21/20 0034 08/22/20 0129 08/23/20 0314 08/24/20 0246 08/25/20 0117  NA 135   < > 138 137 140 145 140 141 136  K 5.4*   < > 5.7* 5.3* 4.8 4.5 4.8 4.4 4.3  CL 109   < > 108 106 105 106 104 103 99  CO2 13*   < > 16* 17* 22 25 21* 26 24  GLUCOSE 398*   < > 281* 160* 220* 122* 177* 64* 316*  BUN 93*   < > 100* 102* 97* 88* 83* 75*  69*  CREATININE 3.84*   < > 3.82* 3.43* 3.05* 2.44* 2.25* 2.08* 1.88*  CALCIUM 10.0   < > 10.1 9.8 9.5 9.7 9.6 9.9 9.5  PHOS 4.1  --   --   --   --   --   --   --   --    < > = values in this interval not displayed.   CBC Recent Labs  Lab 08/22/20 0129 08/23/20 0609 08/24/20 0246 08/25/20 0117  WBC 7.6 5.7 11.3* 8.6  NEUTROABS 6.7 5.1 10.4* 7.6  HGB 10.9* 10.8* 11.4* 11.3*  HCT 33.6* 33.2* 33.9* 33.8*  MCV 74.7* 75.5* 74.7* 77.2*  PLT 120* 145* 144* 152    Medications:    . amLODipine  10 mg Oral Daily  . vitamin C  500 mg Oral Daily  . aspirin EC  81 mg Oral Daily  . baricitinib  1 mg Oral Daily  . Chlorhexidine Gluconate Cloth  6 each Topical Daily  . feeding supplement (GLUCERNA SHAKE)  237 mL Oral TID BM  . feeding supplement (NEPRO CARB STEADY)  237 mL Oral BID BM  . fluticasone  1 spray Each Nare Daily  . Gerhardt's butt cream   Topical Daily  . heparin  7,500 Units Subcutaneous Q8H  . insulin aspart  0-15 Units Subcutaneous TID WC  . insulin aspart  4 Units Subcutaneous TID WC  . insulin detemir  10 Units Subcutaneous QHS  . insulin detemir  15 Units Subcutaneous Daily  . levothyroxine  75 mcg Oral Q0600  . mouth rinse  15 mL Mouth Rinse BID  . methylPREDNISolone (SOLU-MEDROL) injection  60 mg Intravenous Q12H  . metoprolol tartrate  75 mg Oral BID  . pantoprazole  40 mg Oral Daily  . simvastatin  10 mg Oral q1800  . sodium bicarbonate  650 mg Oral TID  . tacrolimus  3 mg Oral QHS  . tacrolimus  4 mg Oral Daily  . zinc sulfate  220 mg Oral Daily      Otelia Santee, MD 08/25/2020, 11:26 AM

## 2020-08-25 NOTE — Progress Notes (Signed)
Due to BMI 35, ok to increase heparin dose to 7500 units per Dr. Waldron Labs.  Onnie Boer, PharmD, BCIDP, AAHIVP, CPP Infectious Disease Pharmacist 08/25/2020 11:23 AM

## 2020-08-25 NOTE — Progress Notes (Signed)
PROGRESS NOTE                                                                             PROGRESS NOTE                                                                                                                                                                                                             Patient Demographics:    [name] [name], is a 65 y.o. female, DOB - 02-26-1956, ZOX:[id number]  Outpatient Primary MD for the patient is Glendale Chard, MD    LOS - 6  Admit date - 08/18/2020    Chief Complaint  Patient presents with  . Covid Positive  . Chest Pain  . Weakness       Brief Narrative    [name] [name] is a 65 yo F with history of renal transplant x2 (latest in 2016) on chronic immunosuppression including Myfortic, Prograf and steroids, type 2 diabetes mellitus , hypothyroidism, and anemia. -Patient presents to ED due to worsening dyspnea, cough, generalized body ache and fatigue, patient with recent positive test on 1/5, she is with significant hypoxia, admitted initially to ICU on heated high flow nasal cannula, she was transferred to triage service 1/14, she is being followed by nephrology service due to worsening renal function with metabolic acidosis and hyperkalemia.    Subjective:    [name] [name] today still with diarrhea (small amount but frequent bowel movements), report dyspnea at baseline, still reports generalized weakness and fatigue.    Assessment  & Plan :    Principal Problem:   ARF (acute renal failure) (HCC) Active Problems:   AKI (acute kidney injury) ([place])   Renal transplant recipient   Immunocompromised ([place])   DM (diabetes mellitus), type 2 with renal complications ([place])   Essential hypertension with goal blood pressure less than 140/90   Acute hypoxemic respiratory failure due to COVID-19 ([place])   Respiratory failure (HCC)   Acute hyperkalemia   Diabetic ketoacidosis without coma  associated with type 1 diabetes  mellitus ([place])   Pressure injury of skin  Acute Hypoxic Resp. Failure due to Acute Covid 19 Viral Pneumonitis during the ongoing 2020 Covid 19 Pandemic  -Fully vaccinated with [employer] x2, and booster as well . -Unfortunately she did develop severe COVID-19 pneumonia despite being fully vaccinated given her chronic immune suppressed status due to her renal transplant. -CT chest without contrast obtained 1/16 given persistent severe hypoxia, significant for diffuse groundglass multifocal opacity -Continue with IV steroids, currently on IV Solu-Medrol. -Continue with remdesivir. -I continue with baricitinib -Continue to trend inflammatory markers -Procalcitonin trending down which is reassuring  Encouraged the patient to sit up in chair in the daytime use I-S and flutter valve for pulmonary toiletry and then prone in bed when at night.  Will advance activity and titrate down oxygen as possible.     SpO2: 94 % O2 Flow Rate (L/min): 25 L/min FiO2 (%): 70 %  Recent Labs  Lab 08/18/20 2030 08/18/20 2128 08/18/20 2230 08/19/20 0216 08/20/20 0135 08/21/20 0034 08/21/20 0118 08/22/20 0129 08/23/20 0314 08/23/20 0609 08/23/20 1039 08/24/20 0246 08/25/20 0117  WBC  --   --   --    < > 6.1  --  9.4 7.6  --  5.7  --  11.3* 8.6  PLT  --   --   --    < > 96*  --  121* 120*  --  145*  --  144* 152  CRP 12.2*  --   --   --  17.0* 10.5*  --  11.5* 10.2*  --   --  7.4*  --   DDIMER 1.81*  --   --   --  1.61* 1.31*  --  1.11*  --  1.39*  --  2.34* 1.88*  PROCALCITON 0.46  --   --   --   --   --   --   --   --   --  0.26  --  0.12  AST  --   --   --    < > 19 21  --  24 27  --   --  24 17  ALT  --   --   --    < > 14 11  --  15 12  --   --  16 15  ALKPHOS  --   --   --    < > 51 49  --  57 67  --   --  66 74  BILITOT  --   --   --    < > 0.5 0.7  --  0.5 0.7  --   --  0.9 0.8  ALBUMIN  --   --   --    < > 3.0* 2.8*  --  2.8* 2.8*  --   --  2.8* 2.7*   LATICACIDVEN  --  1.7 1.6  --   --   --   --   --   --   --   --   --   --    < > = values in this interval not displayed.    FiO2 (%):  [70 %-80 %] 70 %  ABG     Component Value Date/Time   PHART 7.266 (L) 08/19/2020 0808   PCO2ART 32.3 08/19/2020 0808   PO2ART 186 (H) 08/19/2020 0808   HCO3 19.0 (L) 08/20/2020 1101   TCO2 16 (L) 08/19/2020 0808   ACIDBASEDEF 6.4 (H) 08/20/2020 1101  O2SAT 85.1 08/20/2020 1101     AKI on CKD,  -Renal input greatly appreciated. -Currently off bicarb drip.she remains on p.o. sodium bicarb -Patient with history renal transplant, immunosuppression therapy management per renal. -No further hyperkalemia, Lokelma stopped  Hx renal transplant x 2 (last 2016)  - on mycophenolate, prograf, steroids. -Immunosuppression medication management per renal, Myfortic currently on hold  Diabetes mellitus, type II, poorly controlled with DKA  -Resolved, off insulin drip  -Mild, with low CBGs, so we will decrease her Levemir evening time to 10 units in order to avoid hypoglycemia, will have to tolerate some elevated reading, continue with insulin sliding scale.    Hypertension - Continue Amlodipine, Lopressor, remains elevated will start on hydralazine. - prn labetalol   Hypothyroidism -Continue home levothyroxine.  Klebsiella UTI  - continue ceftriaxone x7 days, procalcitonin trending down which is reassuring   Condition - Extremely Guarded  Family communication:  D/W daughter by phone daily  Code Status :  Full  Consults  :  Renal,PCCM  Procedures  :  none  Disposition Plan  :    Status is: Inpatient  Remains inpatient appropriate because:IV treatments appropriate due to intensity of illness or inability to take PO   Dispo: The patient is from: Home              Anticipated d/c is to: Home              Anticipated d/c date is: > 3 days              Patient currently is not medically stable to d/c.      DVT Prophylaxis  :    Heparin  Lab Results  Component Value Date   PLT 152 08/25/2020    Diet :  Diet Order            Diet Carb Modified Fluid consistency: Thin; Room service appropriate? Yes  Diet effective now                  Inpatient Medications  Scheduled Meds: . amLODipine  10 mg Oral Daily  . vitamin C  500 mg Oral Daily  . aspirin EC  81 mg Oral Daily  . baricitinib  1 mg Oral Daily  . Chlorhexidine Gluconate Cloth  6 each Topical Daily  . feeding supplement (GLUCERNA SHAKE)  237 mL Oral TID BM  . feeding supplement (NEPRO CARB STEADY)  237 mL Oral BID BM  . fluticasone  1 spray Each Nare Daily  . Gerhardt's butt cream   Topical Daily  . heparin  7,500 Units Subcutaneous Q8H  . insulin aspart  0-15 Units Subcutaneous TID WC  . insulin aspart  4 Units Subcutaneous TID WC  . insulin detemir  10 Units Subcutaneous QHS  . insulin detemir  15 Units Subcutaneous Daily  . levothyroxine  75 mcg Oral Q0600  . mouth rinse  15 mL Mouth Rinse BID  . methylPREDNISolone (SOLU-MEDROL) injection  60 mg Intravenous Q12H  . metoprolol tartrate  75 mg Oral BID  . pantoprazole  40 mg Oral Daily  . simvastatin  10 mg Oral q1800  . sodium bicarbonate  650 mg Oral TID  . tacrolimus  3 mg Oral QHS  . tacrolimus  4 mg Oral Daily  . zinc sulfate  220 mg Oral Daily   Continuous Infusions: . sodium chloride    . cefTRIAXone (ROCEPHIN)  IV Stopped (08/24/20 2119)   PRN Meds:.sodium chloride, acetaminophen **OR**  acetaminophen, docusate sodium, guaiFENesin, hydrALAZINE, labetalol, loperamide, polyethylene glycol  Antibiotics  :    Anti-infectives (From admission, onward)   Start     Dose/Rate Route Frequency Ordered Stop   08/20/20 1445  cefTRIAXone (ROCEPHIN) 1 g in sodium chloride 0.9 % 100 mL IVPB        1 g 200 mL/hr over 30 Minutes Intravenous Every 24 hours 08/20/20 1348 08/27/20 2159   08/20/20 1000  remdesivir 100 mg in sodium chloride 0.9 % 100 mL IVPB       "Followed by" Linked Group  Details   100 mg 200 mL/hr over 30 Minutes Intravenous Daily 08/19/20 0021 08/23/20 1113   08/19/20 0130  remdesivir 200 mg in sodium chloride 0.9% 250 mL IVPB       "Followed by" Linked Group Details   200 mg 580 mL/hr over 30 Minutes Intravenous Once 08/19/20 0021 08/19/20 0314        Emeline Gins Kennth Vanbenschoten M.D on 08/25/2020 at 2:45 PM  To page go to [email or web address] -  [facility] -  Office  [phone]       Objective:   Vitals:   08/25/20 0515 08/25/20 0719 08/25/20 0844 08/25/20 1441  BP:   (!) 149/74   Pulse:   65   Resp:   20   Temp:      TempSrc:      SpO2: 96% 97% 90% 94%  Weight:      Height:        Wt Readings from Last 3 Encounters:  08/25/20 88 kg  05/14/20 92.5 kg  05/14/20 92.9 kg     Intake/Output Summary (Last 24 hours) at 08/25/2020 1445 Last data filed at 08/25/2020 1004 Gross per 24 hour  Intake 1080 ml  Output 2500 ml  Net -1420 ml     Physical Exam  Awake Alert, Oriented X 3,frail,  No new F.N deficits, Normal affect Symmetrical Chest wall movement, Good air movement bilaterally, scattered rales. RRR,No Gallops,Rubs or new Murmurs, No Parasternal Heave +ve B.Sounds, Abd Soft, No tenderness, No rebound - guarding or rigidity. No Cyanosis, Clubbing or edema, No new Rash or bruise      Data Review:    CBC Recent Labs  Lab 08/21/20 0118 08/22/20 0129 08/23/20 0609 08/24/20 0246 08/25/20 0117  WBC 9.4 7.6 5.7 11.3* 8.6  HGB 11.6* 10.9* 10.8* 11.4* 11.3*  HCT 35.2* 33.6* 33.2* 33.9* 33.8*  PLT 121* 120* 145* 144* 152  MCV 74.7* 74.7* 75.5* 74.7* 77.2*  MCH 24.6* 24.2* 24.5* 25.1* 25.8*  MCHC 33.0 32.4 32.5 33.6 33.4  RDW 14.4 14.5 14.3 14.4 14.5  LYMPHSABS 0.4* 0.3* 0.3* 0.3* 0.4*  MONOABS 0.5 0.5 0.2 0.5 0.4  EOSABS 0.0 0.0 0.0 0.0 0.0  BASOSABS 0.0 0.0 0.0 0.0 0.0    Recent Labs  Lab 08/18/20 2030 08/18/20 2128 08/18/20 2230 08/19/20 0216 08/19/20 0217 08/19/20 0808 08/20/20 0135 08/20/20 1101 08/21/20 0034  08/22/20 0129 08/23/20 0314 08/23/20 0609 08/23/20 1039 08/24/20 0246 08/25/20 0117  NA  --   --   --   --   --    < > 138   < > 140 145 140  --   --  141 136  K  --   --   --   --   --    < > 5.7*   < > 4.8 4.5 4.8  --   --  4.4 4.3  CL  --   --   --   --   --    < >  108   < > 105 106 104  --   --  103 99  CO2  --   --   --   --   --    < > 16*   < > 22 25 21*  --   --  26 24  GLUCOSE  --   --   --   --   --    < > 281*   < > 220* 122* 177*  --   --  64* 316*  BUN  --   --   --   --   --    < > 100*   < > 97* 88* 83*  --   --  75* 69*  CREATININE  --   --   --    < >  --    < > 3.82*   < > 3.05* 2.44* 2.25*  --   --  2.08* 1.88*  CALCIUM  --   --   --   --   --    < > 10.1   < > 9.5 9.7 9.6  --   --  9.9 9.5  AST  --   --   --   --   --    < > 19  --  21 24 27   --   --  24 17  ALT  --   --   --   --   --    < > 14  --  11 15 12   --   --  16 15  ALKPHOS  --   --   --   --   --    < > 51  --  49 57 67  --   --  66 74  BILITOT  --   --   --   --   --    < > 0.5  --  0.7 0.5 0.7  --   --  0.9 0.8  ALBUMIN  --   --   --   --   --    < > 3.0*  --  2.8* 2.8* 2.8*  --   --  2.8* 2.7*  CRP 12.2*  --   --   --   --   --  17.0*  --  10.5* 11.5* 10.2*  --   --  7.4*  --   DDIMER 1.81*  --   --   --   --   --  1.61*  --  1.31* 1.11*  --  1.39*  --  2.34* 1.88*  PROCALCITON 0.46  --   --   --   --   --   --   --   --   --   --   --  0.26  --  0.12  LATICACIDVEN  --  1.7 1.6  --   --   --   --   --   --   --   --   --   --   --   --   HGBA1C  --   --   --   --  8.2*  --   --   --   --   --   --   --   --   --   --    < > = values in this interval not displayed.    ------------------------------------------------------------------------------------------------------------------ No results for input(s): CHOL, HDL,  LDLCALC, TRIG, CHOLHDL, LDLDIRECT in the last 72 hours.  Lab Results  Component Value Date   HGBA1C 8.2 (H) 08/19/2020    ------------------------------------------------------------------------------------------------------------------ No results for input(s): TSH, T4TOTAL, T3FREE, THYROIDAB in the last 72 hours.  Invalid input(s): FREET3  Cardiac Enzymes No results for input(s): CKMB, TROPONINI, MYOGLOBIN in the last 168 hours.  Invalid input(s): CK ------------------------------------------------------------------------------------------------------------------ No results found for: BNP  Micro Results Recent Results (from the past 240 hour(s))  Blood Culture (routine x 2)     Status: None   Collection Time: 08/18/20  8:56 PM   Specimen: BLOOD  Result Value Ref Range Status   Specimen Description BLOOD SITE NOT SPECIFIED  Final   Special Requests   Final    BOTTLES DRAWN AEROBIC ONLY Blood Culture results may not be optimal due to an inadequate volume of blood received in culture bottles   Culture   Final    NO GROWTH 5 DAYS Performed at [facility], [place] [address], [place], [place] [address]    Report Status 08/23/2020 FINAL  Final  Blood Culture (routine x 2)     Status: None   Collection Time: 08/18/20  8:56 PM   Specimen: BLOOD  Result Value Ref Range Status   Specimen Description BLOOD SITE NOT SPECIFIED  Final   Special Requests   Final    BOTTLES DRAWN AEROBIC AND ANAEROBIC Blood Culture results may not be optimal due to an inadequate volume of blood received in culture bottles   Culture   Final    NO GROWTH 5 DAYS Performed at [facility], [place] [address], [place], [place] [address]    Report Status 08/23/2020 FINAL  Final  Culture, Urine     Status: Abnormal   Collection Time: 08/19/20 10:07 AM   Specimen: Urine, Random  Result Value Ref Range Status   Specimen Description URINE, RANDOM  Final   Special Requests   Final    NONE Performed at [facility], [place] [address] [place], [place] [address]    Culture >=100,000 COLONIES/mL KLEBSIELLA PNEUMONIAE (A)   Final   Report Status 08/21/2020 FINAL  Final   Organism ID, Bacteria KLEBSIELLA PNEUMONIAE (A)  Final      Susceptibility   Klebsiella pneumoniae - MIC*    AMPICILLIN >=32 RESISTANT Resistant     CEFAZOLIN <=4 SENSITIVE Sensitive     CEFEPIME <=0.12 SENSITIVE Sensitive     CEFTRIAXONE <=0.25 SENSITIVE Sensitive     CIPROFLOXACIN <=0.25 SENSITIVE Sensitive     GENTAMICIN <=1 SENSITIVE Sensitive     IMIPENEM <=0.25 SENSITIVE Sensitive     NITROFURANTOIN 32 SENSITIVE Sensitive     TRIMETH/SULFA <=20 SENSITIVE Sensitive     AMPICILLIN/SULBACTAM 8 SENSITIVE Sensitive     PIP/TAZO <=4 SENSITIVE Sensitive     * >=100,000 COLONIES/mL KLEBSIELLA PNEUMONIAE  MRSA PCR Screening     Status: None   Collection Time: 08/20/20 12:22 AM   Specimen: Nasopharyngeal  Result Value Ref Range Status   MRSA by PCR NEGATIVE NEGATIVE Final    Comment:        The GeneXpert MRSA Assay (FDA approved for NASAL specimens only), is one component of a comprehensive MRSA colonization surveillance program. It is not intended to diagnose MRSA infection nor to guide or monitor treatment for MRSA infections. Performed at [facility], [place] [address] [place], [place] [address]     Radiology Reports CT CHEST WO CONTRAST  Result Date: 08/24/2020  CLINICAL DATA:  Severe hypoxia and respiratory failure with COVID-19 infection. EXAM: CT CHEST WITHOUT CONTRAST TECHNIQUE: Multidetector CT imaging of the chest was performed following the standard protocol without IV contrast. COMPARISON:  Chest x-ray 08/18/2020 FINDINGS: Cardiovascular: Mild cardiomegaly. Mild calcification over the mitral valve annulus. Thoracic aorta is normal caliber. Stent over the right subclavian/axillary region. Remaining vascular structures are unremarkable. Mediastinum/Nodes: No evidence of mediastinal or hilar adenopathy. Remaining mediastinal structures are unremarkable. Lungs/Pleura: Lungs are adequately inflated demonstrate moderate  bilateral hazy airspace process likely multifocal viral pneumonia in this COVID-19 positive patient. No evidence of effusion. Airways are otherwise unremarkable. Upper Abdomen: No acute findings. Musculoskeletal: Degenerative change of the spine. Mild anterior wedging of a midthoracic vertebral body likely chronic. IMPRESSION: 1. Moderate bilateral hazy airspace process likely multifocal viral pneumonia in this COVID-19 positive patient. 2. Mild cardiomegaly. 3. Mild anterior wedging of a midthoracic vertebral body likely chronic. Electronically Signed   By: Marin Olp M.D.   On: 08/24/2020 12:37   US Renal Transplant w/Doppler  Result Date: 08/19/2020 CLINICAL DATA:  Acute kidney injury EXAM: ULTRASOUND OF RENAL TRANSPLANT WITH RENAL DOPPLER ULTRASOUND TECHNIQUE: Ultrasound examination of the renal transplant was performed with gray-scale, color and duplex doppler evaluation. COMPARISON:  None. FINDINGS: Transplant kidney location: Left lower quadrant Transplant Kidney: Renal measurements: 10.8 x 6.2 x 6.5 cm = volume: 250mL. Normal in size and parenchymal echogenicity. No evidence of mass or hydronephrosis. No peri-transplant fluid collection seen. Color flow in the main renal artery:  Yes Color flow in the main renal vein:  Yes Duplex Doppler Evaluation: Main Renal Artery Velocity:  cm/sec Main Renal Artery Resistive Index: 0.99 Venous waveform in main renal vein:  Present Intrarenal resistive index in upper pole:  0.98 (normal 0.6-0.8; equivocal 0.8-0.9; abnormal >= 0.9) Intrarenal resistive index in lower pole: 0.98 (normal 0.6-0.8; equivocal 0.8-0.9; abnormal >= 0.9) Bladder: Normal for degree of bladder distention. Other findings:  None. IMPRESSION: Elevated resistive indices within the left lower quadrant transplant kidney. No hydronephrosis. Electronically Signed   By: Rolm Baptise M.D.   On: 08/19/2020 19:15   DG Chest Portable 1 View  Result Date: 08/18/2020 CLINICAL DATA:  65 year old female  with history of COVID infection. Chest pain. Shortness of breath. EXAM: PORTABLE CHEST 1 VIEW COMPARISON:  Chest x-ray 10/20/2014. FINDINGS: Patchy multifocal airspace consolidation and areas of interstitial prominence are noted throughout the lungs bilaterally, most severe throughout the periphery of the mid to lower left lung, indicative of multilobar bilateral pneumonia. No definite pleural effusions. No evidence of pulmonary edema. No pneumothorax. Heart size is borderline enlarged. Upper mediastinal contours are within normal limits. Atherosclerotic calcifications in the thoracic aorta. Vascular stent projecting over the right subclavian region. IMPRESSION: 1. Severe multilobar bilateral (left greater than right) pneumonia compatible with reported COVID infection. 2. Aortic atherosclerosis. Electronically Signed   By: Vinnie Langton M.D.   On: 08/18/2020 18:02

## 2020-08-25 NOTE — Progress Notes (Signed)
Occupational Therapy Treatment Patient Details Name: [name] [name] MRN: [id number] DOB: [birth year] Today's Date: 08/25/2020    History of present illness Pt is a 65 y.o. female who tested (+) COVID-19 on 08/13/20, now admitted 08/18/20 with worsening SOB, cough, chest pain and body aches. Workup for acute hypoxic respiratory failure due to COVID-19 PNA, DKA, AKI on CKD, UTI. PMH includes HTN, DM2, chronic steroid use, s/p renal transplant x2.   OT comments  Pt making gradual progress towards OT goals. She remains motivated to return to her PLOF and to work with therapies. Pt tolerating x3 bouts of short standing activity, requiring seated rest throughout completion of mobility tasks  given pt fatigues easily. Pt toelrated taking few steps to recliner and taking few steps forward/back from recliner using RW, overall with minA. Pt did have x1 instance of bil knee buckling while statically standing and requiring min-modA to recover. Session completed on HHFNC (25L, 70%) with SpO2 >/=84%. Continue to recommend SNF level therapies at time of discharge (would be a great candidate for CIR pending when she is able to get out of covid precaution window). Acute OT to follow.   Follow Up Recommendations  SNF;Supervision/Assistance - 24 hour;Other (comment) (pending progress, may can go home with [facility])    Equipment Recommendations  3 in 1 bedside commode          Precautions / Restrictions Precautions Precautions: Fall;Other (comment) Precaution Comments: Watch SpO2 on HHFNC Restrictions Weight Bearing Restrictions: No       Mobility Bed Mobility Overal bed mobility: Needs Assistance Bed Mobility: Supine to Sit     Supine to sit: Min guard     General bed mobility comments: pt elevated HOB to assist, minguard for safety but no assist required  Transfers Overall transfer level: Needs assistance Equipment used: 1 person hand held assist;Rolling walker (2 wheeled) Transfers: Sit  to/from Stand Sit to Stand: Min assist         General transfer comment: VCs for hand placement initially with assist to boost to standing and to steady once upright. completed x1 from EOB and x2 from recliner    Balance Overall balance assessment: Needs assistance Sitting-balance support: Feet supported Sitting balance-Leahy Scale: Fair     Standing balance support: Single extremity supported;Bilateral upper extremity supported Standing balance-Leahy Scale: Poor Standing balance comment: Reliant on UE support and/or external assist                           ADL either performed or assessed with clinical judgement   ADL Overall ADL's : Needs assistance/impaired       Grooming Details (indicate cue type and reason): appyling lotion to LEs with setup/supervision, increased time/effort to perform                             Functional mobility during ADLs: Minimal assistance;Rolling walker General ADL Comments: pt requiring seated rest breaks throughout given fatigue                       Cognition Arousal/Alertness: Awake/alert Behavior During Therapy: WFL for tasks assessed/performed;Flat affect Overall Cognitive Status: Impaired/Different from baseline Area of Impairment: Problem solving                             Problem Solving: Slow processing;Requires verbal cues General Comments: Attribute  apparent cognitive impairment to likely fatigue and "covid fog"        Exercises     Shoulder Instructions       General Comments      Pertinent Vitals/ Pain       Pain Assessment: Faces Faces Pain Scale: Hurts little more Pain Location: bottom Pain Descriptors / Indicators: Discomfort;Sore Pain Intervention(s): Monitored during session;Repositioned;Other (comment) (built up recliner seat with blankets)  Home Living                                          Prior Functioning/Environment               Frequency  Min 2X/week        Progress Toward Goals  OT Goals(current goals can now be found in the care plan section)  Progress towards OT goals: Progressing toward goals  Acute Rehab OT Goals Patient Stated Goal: Hopeful for return home, but willing to consider post-acute rehab at SNF OT Goal Formulation: With patient Time For Goal Achievement: 09/06/20 Potential to Achieve Goals: Good ADL Goals Pt Will Perform Grooming: with supervision;sitting;standing Pt Will Perform Lower Body Bathing: with supervision;sitting/lateral leans;sit to/from stand Pt Will Perform Upper Body Dressing: with modified independence;sitting Pt Will Perform Lower Body Dressing: with supervision;sit to/from stand Pt Will Transfer to Toilet: with supervision;ambulating Pt Will Perform Toileting - Clothing Manipulation and hygiene: with supervision;sit to/from stand;sitting/lateral leans Pt/caregiver will Perform Home Exercise Program: Increased strength;Both right and left upper extremity;With written HEP provided;Independently;With theraband Additional ADL Goal #1: Pt will independently demonstrate carryover of energy conservation techniques during ADL/functional task.  Plan Discharge plan remains appropriate    Co-evaluation                 AM-PAC OT "6 Clicks" Daily Activity     Outcome Measure   Help from another person eating meals?: None Help from another person taking care of personal grooming?: A Little Help from another person toileting, which includes using toliet, bedpan, or urinal?: A Lot Help from another person bathing (including washing, rinsing, drying)?: A Lot Help from another person to put on and taking off regular upper body clothing?: A Little Help from another person to put on and taking off regular lower body clothing?: A Lot 6 Click Score: 16    End of Session Equipment Utilized During Treatment: Oxygen;Rolling walker  OT Visit Diagnosis: Muscle weakness  (generalized) (M62.81);Unsteadiness on feet (R26.81)   Activity Tolerance Patient tolerated treatment well   Patient Left in chair;with call bell/phone within reach;with chair alarm set   Nurse Communication Mobility status        Time: 4270-6237 OT Time Calculation (min): 46 min  Charges: OT General Charges $OT Visit: 1 Visit OT Treatments $Self Care/Home Management : 8-22 mins $Therapeutic Activity: 23-37 mins  Lou Cal, OT Acute Rehabilitation Services Pager [phone] Office [place] 08/25/2020, 1:17 PM

## 2020-08-26 DIAGNOSIS — U071 COVID-19: Secondary | ICD-10-CM | POA: Diagnosis not present

## 2020-08-26 DIAGNOSIS — J9601 Acute respiratory failure with hypoxia: Secondary | ICD-10-CM | POA: Diagnosis not present

## 2020-08-26 DIAGNOSIS — E1010 Type 1 diabetes mellitus with ketoacidosis without coma: Secondary | ICD-10-CM | POA: Diagnosis not present

## 2020-08-26 DIAGNOSIS — N179 Acute kidney failure, unspecified: Secondary | ICD-10-CM | POA: Diagnosis not present

## 2020-08-26 LAB — CBC WITH DIFFERENTIAL/PLATELET
Abs Immature Granulocytes: 0.32 10*3/uL — ABNORMAL HIGH (ref 0.00–0.07)
Basophils Absolute: 0 10*3/uL (ref 0.0–0.1)
Basophils Relative: 0 %
Eosinophils Absolute: 0 10*3/uL (ref 0.0–0.5)
Eosinophils Relative: 0 %
HCT: 31 % — ABNORMAL LOW (ref 36.0–46.0)
Hemoglobin: 10.4 g/dL — ABNORMAL LOW (ref 12.0–15.0)
Immature Granulocytes: 3 %
Lymphocytes Relative: 4 %
Lymphs Abs: 0.4 10*3/uL — ABNORMAL LOW (ref 0.7–4.0)
MCH: 25.1 pg — ABNORMAL LOW (ref 26.0–34.0)
MCHC: 33.5 g/dL (ref 30.0–36.0)
MCV: 74.9 fL — ABNORMAL LOW (ref 80.0–100.0)
Monocytes Absolute: 0.5 10*3/uL (ref 0.1–1.0)
Monocytes Relative: 5 %
Neutro Abs: 8.8 10*3/uL — ABNORMAL HIGH (ref 1.7–7.7)
Neutrophils Relative %: 88 %
Platelets: 132 10*3/uL — ABNORMAL LOW (ref 150–400)
RBC: 4.14 MIL/uL (ref 3.87–5.11)
RDW: 14.1 % (ref 11.5–15.5)
WBC: 10.1 10*3/uL (ref 4.0–10.5)
nRBC: 0 % (ref 0.0–0.2)

## 2020-08-26 LAB — GLUCOSE, CAPILLARY
Glucose-Capillary: 263 mg/dL — ABNORMAL HIGH (ref 70–99)
Glucose-Capillary: 315 mg/dL — ABNORMAL HIGH (ref 70–99)
Glucose-Capillary: 338 mg/dL — ABNORMAL HIGH (ref 70–99)
Glucose-Capillary: 321 mg/dL — ABNORMAL HIGH (ref 70–99)

## 2020-08-26 LAB — COMPREHENSIVE METABOLIC PANEL
ALT: 14 U/L (ref 0–44)
AST: 16 U/L (ref 15–41)
Albumin: 2.5 g/dL — ABNORMAL LOW (ref 3.5–5.0)
Alkaline Phosphatase: 66 U/L (ref 38–126)
Anion gap: 13 (ref 5–15)
BUN: 68 mg/dL — ABNORMAL HIGH (ref 8–23)
CO2: 24 mmol/L (ref 22–32)
Calcium: 9.3 mg/dL (ref 8.9–10.3)
Chloride: 98 mmol/L (ref 98–111)
Creatinine, Ser: 1.96 mg/dL — ABNORMAL HIGH (ref 0.44–1.00)
GFR, Estimated: 28 mL/min — ABNORMAL LOW (ref >60)
Glucose, Bld: 298 mg/dL — ABNORMAL HIGH (ref 70–99)
Potassium: 4.1 mmol/L (ref 3.5–5.1)
Sodium: 135 mmol/L (ref 135–145)
Total Bilirubin: 0.8 mg/dL (ref 0.3–1.2)
Total Protein: 5.9 g/dL — ABNORMAL LOW (ref 6.5–8.1)

## 2020-08-26 LAB — MAGNESIUM: Magnesium: 2.1 mg/dL (ref 1.7–2.4)

## 2020-08-26 LAB — D-DIMER, QUANTITATIVE: D-Dimer, Quant: 1.39 ug/mL-FEU — ABNORMAL HIGH (ref 0.00–0.50)

## 2020-08-26 LAB — TACROLIMUS LEVEL: Tacrolimus (FK506) - LabCorp: 5.9 ng/mL (ref 2.0–20.0)

## 2020-08-26 LAB — PROCALCITONIN: Procalcitonin: <0.1 ng/mL

## 2020-08-26 MED ORDER — INSULIN DETEMIR 100 UNIT/ML ~~LOC~~ SOLN
16.0000 [IU] | Freq: Two times a day (BID) | SUBCUTANEOUS | Status: DC
  Administered 2020-08-26 – 2020-08-27 (×2): 16 [IU] via SUBCUTANEOUS
  Filled 2020-08-26 (×4): qty 0.16

## 2020-08-26 NOTE — Progress Notes (Signed)
[facility] Progress Note   65 yo F with hx of DDRT x2 (latest 2016) on prograf, myfortic, and low-dose prednisone, T2DM, hypothyroidism, and recent diagnosis of COVID-19 presenting with acute hypoxic respiratory failure 2/2 COVID PNA. Also found to be in DKA with worsening AoCKD and hyperkalemia on labs.  Assessment:    1. AKI on CKD, improving. Non-oliguric. Peak Cr 4 2. H/O DDKT x 2 (latest 2016). Baseline Cr ?1.7.Follows w/ Dr. Marval Regal ([facility]) & [facility] 3. Chronic immunosuppressionTac/MMF/Pred 4. AHRF secondary COVID-19 PNA, stable HF [place]; solumedrol 5. DKA, h/o uncontrolled DM2w/ hyperglycemia, resolved 6. Hyperkalemia, resolved. Off lokelma 7. Anion Gap Metabolic acidosis, resolved/stable on NaHCO3 8. HTN 9. Klebsiella UTI, on rocephin  Plan: Stable Cr and UOP, Cont to hold MMF for now ,cont Tac/Pred Tac 1/14 5.9, cont current dosing No further suggestions, resume MMF once off directed COVID therapies and resp status is sig improved WIll make sure has close f/u in our office. Please call with any questions, will follow from distance given high census.    Subjective:     Remains HFNC  1.2L UOP recorded  Stable SCr, K  On tac/solumedrol, MMF held  No c/o from patient    Objective:   BP 135/74   Pulse 78   Temp 98 F (36.7 C) (Oral)   Resp (!) 21   Ht 5\' 2"  (1.575 m)   Wt 90.6 kg   SpO2 99%   BMI 36.53 kg/m   Intake/Output Summary (Last 24 hours) at 08/26/2020 1327 Last data filed at 08/26/2020 0600 Gross per 24 hour  Intake 415 ml  Output 1225 ml  Net -810 ml   Weight change: 0.9 kg  Physical Exam: GEN: NAD, NCAT HEENT: No conjunctival pallor, EOMI NECK: Supple, no thyromegaly LUNGS: B/L rhonchi CV: RRR, No M/R/G ABD: SNDNT +BS  EXT: No lower extremity edema ACCESS: rt BCF aneurysmal pulsatile   Imaging: No results found.  Labs: BMET Recent Labs  Lab 08/20/20 1101 08/21/20 0034 08/22/20 0129  08/23/20 0314 08/24/20 0246 08/25/20 0117 08/26/20 0111  NA 137 140 145 140 141 136 135  K 5.3* 4.8 4.5 4.8 4.4 4.3 4.1  CL 106 105 106 104 103 99 98  CO2 17* 22 25 21* 26 24 24   GLUCOSE 160* 220* 122* 177* 64* 316* 298*  BUN 102* 97* 88* 83* 75* 69* 68*  CREATININE 3.43* 3.05* 2.44* 2.25* 2.08* 1.88* 1.96*  CALCIUM 9.8 9.5 9.7 9.6 9.9 9.5 9.3   CBC Recent Labs  Lab 08/23/20 0609 08/24/20 0246 08/25/20 0117 08/26/20 0111  WBC 5.7 11.3* 8.6 10.1  NEUTROABS 5.1 10.4* 7.6 8.8*  HGB 10.8* 11.4* 11.3* 10.4*  HCT 33.2* 33.9* 33.8* 31.0*  MCV 75.5* 74.7* 77.2* 74.9*  PLT 145* 144* 152 132*    Medications:    . amLODipine  10 mg Oral Daily  . vitamin C  500 mg Oral Daily  . aspirin EC  81 mg Oral Daily  . baricitinib  1 mg Oral Daily  . Chlorhexidine Gluconate Cloth  6 each Topical Daily  . feeding supplement (GLUCERNA SHAKE)  237 mL Oral TID BM  . feeding supplement (NEPRO CARB STEADY)  237 mL Oral BID BM  . fluticasone  1 spray Each Nare Daily  . heparin  7,500 Units Subcutaneous Q8H  . hydrALAZINE  25 mg Oral Q8H  . insulin aspart  0-15 Units Subcutaneous TID WC  . insulin aspart  4 Units Subcutaneous TID WC  .  insulin detemir  16 Units Subcutaneous BID  . levothyroxine  75 mcg Oral Q0600  . mouth rinse  15 mL Mouth Rinse BID  . methylPREDNISolone (SOLU-MEDROL) injection  60 mg Intravenous Q12H  . metoprolol tartrate  75 mg Oral BID  . pantoprazole  40 mg Oral Daily  . simvastatin  10 mg Oral q1800  . sodium bicarbonate  650 mg Oral TID  . tacrolimus  3 mg Oral QHS  . tacrolimus  4 mg Oral Daily  . zinc sulfate  220 mg Oral Daily    Rexene Agent, MD  08/26/2020, 1:27 PM

## 2020-08-26 NOTE — Progress Notes (Signed)
Physical Therapy Treatment Patient Details Name: [name] [name] MRN: [id number] DOB: 10/03/1955 Today's Date: 08/26/2020    History of Present Illness Pt is a 65 y.o. female who tested (+) COVID-19 on 08/13/20, now admitted 08/18/20 with worsening SOB, cough, chest pain and body aches. Workup for acute hypoxic respiratory failure due to COVID-19 PNA, DKA, AKI on CKD, UTI. PMH includes HTN, DM2, chronic steroid use, s/p renal transplant x2.   PT Comments    Pt slowly progressing with mobility. Motivated to participate, but remains limited by significant fatigue and DOE with minimal activity on 25L O2 HHFNC at 60% FiO2. Pt much more clear and alert once seated in recliner compared to when in bed at beginning of session (RN aware). Will continue to follow acutely.    Follow Up Recommendations  SNF;Supervision/Assistance - 24 hour (vs. HH pending progress)     Equipment Recommendations   (TBD)    Recommendations for Other Services       Precautions / Restrictions Precautions Precautions: Fall;Other (comment) Precaution Comments: Watch SpO2 on HHFNC Restrictions Weight Bearing Restrictions: No    Mobility  Bed Mobility Overal bed mobility: Needs Assistance Bed Mobility: Supine to Sit     Supine to sit: Mod assist;HOB elevated     General bed mobility comments: Encouraged pt to attempt sitting up without raising HOB all the way up; scooting hips to EOB well, modA to assist trunk elevation with BUE support  Transfers Overall transfer level: Needs assistance Equipment used: Rolling walker (2 wheeled) Transfers: Sit to/from Stand Sit to Stand: Mod assist;Min assist         General transfer comment: Initial modA to assist trunk elevation standing from EOB to RW; 2x standing from recliner with minA when cued for correct hand placement; increased use of momentum with fatigue  Ambulation/Gait Ambulation/Gait assistance: Min guard Gait Distance (Feet): 2 Feet Assistive  device: Rolling walker (2 wheeled) Gait Pattern/deviations: Step-to pattern;Trunk flexed Gait velocity: Decreased   General Gait Details: Slow, labored steps to recliner with RW and close min guard; SpO2 down to 79% with unreliable pleth, pt with 4/4 DOE requiring prolonged seated rest; further distance limited by this and fatigue   Stairs             Wheelchair Mobility    Modified Rankin (Stroke Patients Only)       Balance Overall balance assessment: Needs assistance Sitting-balance support: Feet supported Sitting balance-Leahy Scale: Fair     Standing balance support: Single extremity supported;Bilateral upper extremity supported Standing balance-Leahy Scale: Poor Standing balance comment: Reliant on UE support, dependent for posterior pericare while standing                            Cognition Arousal/Alertness: Awake/alert Behavior During Therapy: WFL for tasks assessed/performed;Flat affect Overall Cognitive Status: Impaired/Different from baseline Area of Impairment: Problem solving;Following commands                       Following Commands: Follows multi-step commands with increased time     Problem Solving: Slow processing;Requires verbal cues General Comments: Attribute apparent cognitive impairment to likely fatigue and "covid fog"; much more alert and improved processing once out of bed and seated in recliner      Exercises      General Comments General comments (skin integrity, edema, etc.): SpO2 down to 79% (unreliable pleth) on 25L O2 HHFNC at 60% FiO2, back up  to 92% with seated rest and kink in [place] line fixed; with additional standing activity from recliner, down to 86% with quicker recovery during seated rest to 92%. Pt looks much clearer once seated in recliner compared to laying in bed (RN aware); assist for food set-up and pt able to feed self      Pertinent Vitals/Pain Pain Assessment: Faces Faces Pain Scale: Hurts  a little bit Pain Location: bottom Pain Descriptors / Indicators: Discomfort;Sore Pain Intervention(s): Monitored during session;Repositioned;Other (comment) (pillow under buttocks in recliner)    Home Living                      Prior Function            PT Goals (current goals can now be found in the care plan section) Progress towards PT goals: Progressing toward goals    Frequency    Min 3X/week      PT Plan Current plan remains appropriate    Co-evaluation              AM-PAC PT "6 Clicks" Mobility   Outcome Measure  Help needed turning from your back to your side while in a flat bed without using bedrails?: A Little Help needed moving from lying on your back to sitting on the side of a flat bed without using bedrails?: A Lot Help needed moving to and from a bed to a chair (including a wheelchair)?: A Little Help needed standing up from a chair using your arms (e.g., wheelchair or bedside chair)?: A Lot Help needed to walk in hospital room?: A Little Help needed climbing 3-5 steps with a railing? : A Lot 6 Click Score: 15    End of Session Equipment Utilized During Treatment: Oxygen Activity Tolerance: Patient tolerated treatment well;Patient limited by fatigue Patient left: in chair;with call bell/phone within reach;with chair alarm set Nurse Communication: Mobility status PT Visit Diagnosis: Other abnormalities of gait and mobility (R26.89);Muscle weakness (generalized) (M62.81)     Time: 3716-9678 PT Time Calculation (min) (ACUTE ONLY): 24 min  Charges:  $Therapeutic Activity: 23-37 mins                     Mabeline Caras, PT, DPT Acute Rehabilitation Services  Pager [phone] Office [phone]  Derry Lory 08/26/2020, 11:05 AM

## 2020-08-26 NOTE — Progress Notes (Signed)
During foley/peri care, patient found to half pea-sized nodule at the lower portion of the left labia (approximately 4 o'clock position). Charted & foley care completed.

## 2020-08-26 NOTE — Progress Notes (Signed)
PROGRESS NOTE                                                                             PROGRESS NOTE                                                                                                                                                                                                             Patient Demographics:    [name] [name], is a 65 y.o. female, DOB - Apr 26, 1956, FXJ:[id number]  Outpatient Primary MD for the patient is Glendale Chard, MD    LOS - 7  Admit date - 08/18/2020    Chief Complaint  Patient presents with  . Covid Positive  . Chest Pain  . Weakness       Brief Narrative    [name] [name] is a 65 yo F with history of renal transplant x2 (latest in 2016) on chronic immunosuppression including Myfortic, Prograf and steroids, type 2 diabetes mellitus , hypothyroidism, and anemia. -Patient presents to ED due to worsening dyspnea, cough, generalized body ache and fatigue, patient with recent positive test on 1/5, she is with significant hypoxia, admitted initially to ICU on heated high flow nasal cannula, she was transferred to triage service 1/14, she is being followed by nephrology service due to worsening renal function with metabolic acidosis and hyperkalemia.    Subjective:    [name] [name] today has improved, reports dyspnea at baseline, she does report some cough.     Assessment  & Plan :    Principal Problem:   ARF (acute renal failure) (HCC) Active Problems:   AKI (acute kidney injury) ([place])   Renal transplant recipient   Immunocompromised ([place])   DM (diabetes mellitus), type 2 with renal complications ([place])   Essential hypertension with goal blood pressure less than 140/90   Acute hypoxemic respiratory failure due to COVID-19 ([place])   Respiratory failure (HCC)   Acute hyperkalemia   Diabetic ketoacidosis without coma associated with type 1 diabetes mellitus ([place])   Pressure injury of  skin  Acute Hypoxic Resp. Failure due to Acute Covid 19 Viral Pneumonitis during the ongoing 2020 Covid 19 Pandemic  -Fully vaccinated with [employer] x2, and booster as well . -Unfortunately she did develop severe COVID-19 pneumonia despite being fully vaccinated given her chronic immune suppressed status due to her renal transplant. -CT chest without contrast obtained 1/16 given persistent severe hypoxia, significant for diffuse groundglass multifocal opacity -Continue with IV steroids, currently on IV Solu-Medrol. -treated with remdesivir. -I continue with baricitinib -Continue to trend inflammatory markers -Procalcitonin trending down which is reassuring -Management significant hypoxia and oxygen requirement, she is on 25 L heated high flow nasal cannula at 60%.  Encouraged the patient to sit up in chair in the daytime use I-S and flutter valve for pulmonary toiletry and then prone in bed when at night.  Will advance activity and titrate down oxygen as possible.     SpO2: 99 % O2 Flow Rate (L/min): 25 L/min FiO2 (%): 60 %  Recent Labs  Lab 08/20/20 0135 08/21/20 0034 08/21/20 0118 08/22/20 0129 08/23/20 0314 08/23/20 0609 08/23/20 1039 08/24/20 0246 08/25/20 0117 08/26/20 0111  WBC 6.1  --    < > 7.6  --  5.7  --  11.3* 8.6 10.1  PLT 96*  --    < > 120*  --  145*  --  144* 152 132*  CRP 17.0* 10.5*  --  11.5* 10.2*  --   --  7.4*  --   --   DDIMER 1.61* 1.31*  --  1.11*  --  1.39*  --  2.34* 1.88* 1.39*  PROCALCITON  --   --   --   --   --   --  0.26  --  0.12 <0.10  AST 19 21  --  24 27  --   --  24 17 16   ALT 14 11  --  15 12  --   --  16 15 14   ALKPHOS 51 49  --  57 67  --   --  66 74 66  BILITOT 0.5 0.7  --  0.5 0.7  --   --  0.9 0.8 0.8  ALBUMIN 3.0* 2.8*  --  2.8* 2.8*  --   --  2.8* 2.7* 2.5*   < > = values in this interval not displayed.    FiO2 (%):  [60 %-70 %] 60 %  ABG     Component Value Date/Time   PHART 7.266 (L) 08/19/2020 0808   PCO2ART 32.3  08/19/2020 0808   PO2ART 186 (H) 08/19/2020 0808   HCO3 19.0 (L) 08/20/2020 1101   TCO2 16 (L) 08/19/2020 0808   ACIDBASEDEF 6.4 (H) 08/20/2020 1101   O2SAT 85.1 08/20/2020 1101     AKI on CKD,  -Renal input greatly appreciated. -Currently off bicarb drip.she remains on p.o. sodium bicarb -Patient with history renal transplant, immunosuppression therapy management per renal. -No further hyperkalemia, Lokelma stopped  Hx renal transplant x 2 (last 2016)  - on mycophenolate, prograf, steroids. -Immunosuppression medication management per renal, Myfortic currently on hold  Diabetes mellitus, type II, poorly controlled with DKA  -Resolved, off insulin drip  -CBG is labile, will increase Levemir to 16 units twice daily . -Continue with insulin sliding scale .  Hypertension - Continue Amlodipine, Lopressor, remains elevated will start on hydralazine. - prn labetalol   Hypothyroidism -Continue home levothyroxine.  Klebsiella UTI  - continue ceftriaxone x7 days, procalcitonin trending down which is reassuring  Condition - Extremely Guarded  Family communication:  D/W daughter [name] [name] [phone]  by phone daily  Code Status :  Full  Consults  :  Renal,PCCM  Procedures  :  none  Disposition Plan  :    Status is: Inpatient  Remains inpatient appropriate because:IV treatments appropriate due to intensity of illness or inability to take PO   Dispo: The patient is from: Home              Anticipated d/c is to: Home              Anticipated d/c date is: > 3 days              Patient currently is not medically stable to d/c.      DVT Prophylaxis  :   Heparin  Lab Results  Component Value Date   PLT 132 (L) 08/26/2020    Diet :  Diet Order            Diet Carb Modified Fluid consistency: Thin; Room service appropriate? Yes  Diet effective now                  Inpatient Medications  Scheduled Meds: . amLODipine  10 mg Oral Daily  . vitamin C  500  mg Oral Daily  . aspirin EC  81 mg Oral Daily  . baricitinib  1 mg Oral Daily  . Chlorhexidine Gluconate Cloth  6 each Topical Daily  . feeding supplement (GLUCERNA SHAKE)  237 mL Oral TID BM  . feeding supplement (NEPRO CARB STEADY)  237 mL Oral BID BM  . fluticasone  1 spray Each Nare Daily  . heparin  7,500 Units Subcutaneous Q8H  . hydrALAZINE  25 mg Oral Q8H  . insulin aspart  0-15 Units Subcutaneous TID WC  . insulin aspart  4 Units Subcutaneous TID WC  . insulin detemir  10 Units Subcutaneous QHS  . insulin detemir  15 Units Subcutaneous Daily  . levothyroxine  75 mcg Oral Q0600  . mouth rinse  15 mL Mouth Rinse BID  . methylPREDNISolone (SOLU-MEDROL) injection  60 mg Intravenous Q12H  . metoprolol tartrate  75 mg Oral BID  . pantoprazole  40 mg Oral Daily  . simvastatin  10 mg Oral q1800  . sodium bicarbonate  650 mg Oral TID  . tacrolimus  3 mg Oral QHS  . tacrolimus  4 mg Oral Daily  . zinc sulfate  220 mg Oral Daily   Continuous Infusions: . sodium chloride    . cefTRIAXone (ROCEPHIN)  IV 1 g (08/25/20 2229)   PRN Meds:.sodium chloride, acetaminophen **OR** acetaminophen, docusate sodium, guaiFENesin, hydrALAZINE, labetalol, loperamide, polyethylene glycol  Antibiotics  :    Anti-infectives (From admission, onward)   Start     Dose/Rate Route Frequency Ordered Stop   08/20/20 1445  cefTRIAXone (ROCEPHIN) 1 g in sodium chloride 0.9 % 100 mL IVPB        1 g 200 mL/hr over 30 Minutes Intravenous Every 24 hours 08/20/20 1348 08/27/20 2159   08/20/20 1000  remdesivir 100 mg in sodium chloride 0.9 % 100 mL IVPB       "Followed by" Linked Group Details   100 mg 200 mL/hr over 30 Minutes Intravenous Daily 08/19/20 0021 08/23/20 1113   08/19/20 0130  remdesivir 200 mg in sodium chloride 0.9% 250 mL IVPB       "Followed by" Linked Group Details   200 mg 580 mL/hr over  30 Minutes Intravenous Once 08/19/20 0021 08/19/20 0314        Phillips Climes M.D on 08/26/2020 at  12:10 PM  To page go to [email or web address] -  [facility] -  Office  [phone]       Objective:   Vitals:   08/26/20 0349 08/26/20 0400 08/26/20 0621 08/26/20 0713  BP:  (!) 155/76 135/74   Pulse: 74 66 78   Resp: (!) 27 (!) 21    Temp:  98 F (36.7 C)    TempSrc:  Oral    SpO2: 91% 91%  99%  Weight:  90.6 kg    Height:        Wt Readings from Last 3 Encounters:  08/26/20 90.6 kg  05/14/20 92.5 kg  05/14/20 92.9 kg     Intake/Output Summary (Last 24 hours) at 08/26/2020 1210 Last data filed at 08/26/2020 0600 Gross per 24 hour  Intake 415 ml  Output 1225 ml  Net -810 ml     Physical Exam  Awake Alert, Oriented X 3, frail, easily distracted no new F.N deficits, Normal affect Symmetrical Chest wall movement, Good air movement bilaterally, scattered rales RRR,No Gallops,Rubs or new Murmurs, No Parasternal Heave +ve B.Sounds, Abd Soft, No tenderness, No rebound - guarding or rigidity. No Cyanosis, Clubbing or edema, No new Rash or bruise       Data Review:    CBC Recent Labs  Lab 08/22/20 0129 08/23/20 0609 08/24/20 0246 08/25/20 0117 08/26/20 0111  WBC 7.6 5.7 11.3* 8.6 10.1  HGB 10.9* 10.8* 11.4* 11.3* 10.4*  HCT 33.6* 33.2* 33.9* 33.8* 31.0*  PLT 120* 145* 144* 152 132*  MCV 74.7* 75.5* 74.7* 77.2* 74.9*  MCH 24.2* 24.5* 25.1* 25.8* 25.1*  MCHC 32.4 32.5 33.6 33.4 33.5  RDW 14.5 14.3 14.4 14.5 14.1  LYMPHSABS 0.3* 0.3* 0.3* 0.4* 0.4*  MONOABS 0.5 0.2 0.5 0.4 0.5  EOSABS 0.0 0.0 0.0 0.0 0.0  BASOSABS 0.0 0.0 0.0 0.0 0.0    Recent Labs  Lab 08/20/20 0135 08/20/20 1101 08/21/20 0034 08/22/20 0129 08/23/20 0314 08/23/20 0609 08/23/20 1039 08/24/20 0246 08/25/20 0117 08/26/20 0111 08/26/20 0526  NA 138   < > 140 145 140  --   --  141 136 135  --   K 5.7*   < > 4.8 4.5 4.8  --   --  4.4 4.3 4.1  --   CL 108   < > 105 106 104  --   --  103 99 98  --   CO2 16*   < > 22 25 21*  --   --  26 24 24   --   GLUCOSE 281*   < > 220* 122*  177*  --   --  64* 316* 298*  --   BUN 100*   < > 97* 88* 83*  --   --  75* 69* 68*  --   CREATININE 3.82*   < > 3.05* 2.44* 2.25*  --   --  2.08* 1.88* 1.96*  --   CALCIUM 10.1   < > 9.5 9.7 9.6  --   --  9.9 9.5 9.3  --   AST 19  --  21 24 27   --   --  24 17 16   --   ALT 14  --  11 15 12   --   --  16 15 14   --   ALKPHOS 51  --  49 57 67  --   --  66 74 66  --   BILITOT 0.5  --  0.7 0.5 0.7  --   --  0.9 0.8 0.8  --   ALBUMIN 3.0*  --  2.8* 2.8* 2.8*  --   --  2.8* 2.7* 2.5*  --   MG  --   --   --   --   --   --   --   --   --   --  2.1  CRP 17.0*  --  10.5* 11.5* 10.2*  --   --  7.4*  --   --   --   DDIMER 1.61*  --  1.31* 1.11*  --  1.39*  --  2.34* 1.88* 1.39*  --   PROCALCITON  --   --   --   --   --   --  0.26  --  0.12 <0.10  --    < > = values in this interval not displayed.    ------------------------------------------------------------------------------------------------------------------ No results for input(s): CHOL, HDL, LDLCALC, TRIG, CHOLHDL, LDLDIRECT in the last 72 hours.  Lab Results  Component Value Date   HGBA1C 8.2 (H) 08/19/2020   ------------------------------------------------------------------------------------------------------------------ No results for input(s): TSH, T4TOTAL, T3FREE, THYROIDAB in the last 72 hours.  Invalid input(s): FREET3  Cardiac Enzymes No results for input(s): CKMB, TROPONINI, MYOGLOBIN in the last 168 hours.  Invalid input(s): CK ------------------------------------------------------------------------------------------------------------------ No results found for: BNP  Micro Results Recent Results (from the past 240 hour(s))  Blood Culture (routine x 2)     Status: None   Collection Time: 08/18/20  8:56 PM   Specimen: BLOOD  Result Value Ref Range Status   Specimen Description BLOOD SITE NOT SPECIFIED  Final   Special Requests   Final    BOTTLES DRAWN AEROBIC ONLY Blood Culture results may not be optimal due to an  inadequate volume of blood received in culture bottles   Culture   Final    NO GROWTH 5 DAYS Performed at [facility], [place] [address], [place], [place] [address]    Report Status 08/23/2020 FINAL  Final  Blood Culture (routine x 2)     Status: None   Collection Time: 08/18/20  8:56 PM   Specimen: BLOOD  Result Value Ref Range Status   Specimen Description BLOOD SITE NOT SPECIFIED  Final   Special Requests   Final    BOTTLES DRAWN AEROBIC AND ANAEROBIC Blood Culture results may not be optimal due to an inadequate volume of blood received in culture bottles   Culture   Final    NO GROWTH 5 DAYS Performed at [facility], [place] [address], [place], [place] [address]    Report Status 08/23/2020 FINAL  Final  Culture, Urine     Status: Abnormal   Collection Time: 08/19/20 10:07 AM   Specimen: Urine, Random  Result Value Ref Range Status   Specimen Description URINE, RANDOM  Final   Special Requests   Final    NONE Performed at [facility], [place] [address] [place],  [address]    Culture >=100,000 COLONIES/mL KLEBSIELLA PNEUMONIAE (A)  Final   Report Status 08/21/2020 FINAL  Final   Organism ID, Bacteria KLEBSIELLA PNEUMONIAE (A)  Final      Susceptibility   Klebsiella pneumoniae - MIC*    AMPICILLIN >=32 RESISTANT Resistant     CEFAZOLIN <=4 SENSITIVE Sensitive     CEFEPIME <=0.12 SENSITIVE Sensitive     CEFTRIAXONE <=0.25 SENSITIVE  Sensitive     CIPROFLOXACIN <=0.25 SENSITIVE Sensitive     GENTAMICIN <=1 SENSITIVE Sensitive     IMIPENEM <=0.25 SENSITIVE Sensitive     NITROFURANTOIN 32 SENSITIVE Sensitive     TRIMETH/SULFA <=20 SENSITIVE Sensitive     AMPICILLIN/SULBACTAM 8 SENSITIVE Sensitive     PIP/TAZO <=4 SENSITIVE Sensitive     * >=100,000 COLONIES/mL KLEBSIELLA PNEUMONIAE  MRSA PCR Screening     Status: None   Collection Time: 08/20/20 12:22 AM   Specimen: Nasopharyngeal  Result Value Ref Range Status   MRSA by PCR NEGATIVE NEGATIVE Final     Comment:        The GeneXpert MRSA Assay (FDA approved for NASAL specimens only), is one component of a comprehensive MRSA colonization surveillance program. It is not intended to diagnose MRSA infection nor to guide or monitor treatment for MRSA infections. Performed at [facility], [place] [address] [place], [place] [address]     Radiology Reports CT CHEST WO CONTRAST  Result Date: 08/24/2020 CLINICAL DATA:  Severe hypoxia and respiratory failure with COVID-19 infection. EXAM: CT CHEST WITHOUT CONTRAST TECHNIQUE: Multidetector CT imaging of the chest was performed following the standard protocol without IV contrast. COMPARISON:  Chest x-ray 08/18/2020 FINDINGS: Cardiovascular: Mild cardiomegaly. Mild calcification over the mitral valve annulus. Thoracic aorta is normal caliber. Stent over the right subclavian/axillary region. Remaining vascular structures are unremarkable. Mediastinum/Nodes: No evidence of mediastinal or hilar adenopathy. Remaining mediastinal structures are unremarkable. Lungs/Pleura: Lungs are adequately inflated demonstrate moderate bilateral hazy airspace process likely multifocal viral pneumonia in this COVID-19 positive patient. No evidence of effusion. Airways are otherwise unremarkable. Upper Abdomen: No acute findings. Musculoskeletal: Degenerative change of the spine. Mild anterior wedging of a midthoracic vertebral body likely chronic. IMPRESSION: 1. Moderate bilateral hazy airspace process likely multifocal viral pneumonia in this COVID-19 positive patient. 2. Mild cardiomegaly. 3. Mild anterior wedging of a midthoracic vertebral body likely chronic. Electronically Signed   By: Marin Olp M.D.   On: 08/24/2020 12:37   US Renal Transplant w/Doppler  Result Date: 08/19/2020 CLINICAL DATA:  Acute kidney injury EXAM: ULTRASOUND OF RENAL TRANSPLANT WITH RENAL DOPPLER ULTRASOUND TECHNIQUE: Ultrasound examination of the renal transplant was performed with  gray-scale, color and duplex doppler evaluation. COMPARISON:  None. FINDINGS: Transplant kidney location: Left lower quadrant Transplant Kidney: Renal measurements: 10.8 x 6.2 x 6.5 cm = volume: 216mL. Normal in size and parenchymal echogenicity. No evidence of mass or hydronephrosis. No peri-transplant fluid collection seen. Color flow in the main renal artery:  Yes Color flow in the main renal vein:  Yes Duplex Doppler Evaluation: Main Renal Artery Velocity:  cm/sec Main Renal Artery Resistive Index: 0.99 Venous waveform in main renal vein:  Present Intrarenal resistive index in upper pole:  0.98 (normal 0.6-0.8; equivocal 0.8-0.9; abnormal >= 0.9) Intrarenal resistive index in lower pole: 0.98 (normal 0.6-0.8; equivocal 0.8-0.9; abnormal >= 0.9) Bladder: Normal for degree of bladder distention. Other findings:  None. IMPRESSION: Elevated resistive indices within the left lower quadrant transplant kidney. No hydronephrosis. Electronically Signed   By: Rolm Baptise M.D.   On: 08/19/2020 19:15   DG Chest Portable 1 View  Result Date: 08/18/2020 CLINICAL DATA:  64 year old female with history of COVID infection. Chest pain. Shortness of breath. EXAM: PORTABLE CHEST 1 VIEW COMPARISON:  Chest x-ray 10/20/2014. FINDINGS: Patchy multifocal airspace consolidation and areas of interstitial prominence are noted throughout the lungs bilaterally, most severe throughout the periphery of the mid to lower left  lung, indicative of multilobar bilateral pneumonia. No definite pleural effusions. No evidence of pulmonary edema. No pneumothorax. Heart size is borderline enlarged. Upper mediastinal contours are within normal limits. Atherosclerotic calcifications in the thoracic aorta. Vascular stent projecting over the right subclavian region. IMPRESSION: 1. Severe multilobar bilateral (left greater than right) pneumonia compatible with reported COVID infection. 2. Aortic atherosclerosis. Electronically Signed   By: Vinnie Langton M.D.   On: 08/18/2020 18:02

## 2020-08-26 NOTE — Progress Notes (Signed)
Inpatient Diabetes Program Recommendations  AACE/ADA: New Consensus Statement on Inpatient Glycemic Control (2015)  Target Ranges:  Prepandial:   less than 140 mg/dL      Peak postprandial:   less than 180 mg/dL (1-2 hours)      Critically ill patients:  140 - 180 mg/dL   Lab Results  Component Value Date   GLUCAP 263 (H) 08/26/2020   HGBA1C 8.2 (H) 08/19/2020    Review of Glycemic Control Results for [name] [name], [name] [name] (MRN [id number]) as of 08/26/2020 09:50  Ref. Range 08/25/2020 08:10 08/25/2020 12:13 08/25/2020 17:12 08/25/2020 20:22 08/26/2020 07:29  Glucose-Capillary Latest Ref Range: 70 - 99 mg/dL 236 (H) 178 (H) 208 (H) 242 (H) 263 (H)   Diabetes history:  DM Outpatient Diabetes medications:  Levemir 30 units q AM and Levemir 35 units q HS Novolog 4 units tid with meals Current orders for Inpatient glycemic control:  Levemir 15 qam & 10 qpm Novolog moderate tid with meals Novolog 4 units TID with meals Solumedrol 60 Q12H   Inpatient Diabetes Program Recommendations:    Levemir 20 units BID Novolog 0-5 units QHS  Will continue to follow while inpatient.  Thank you, Reche Dixon, RN, BSN Diabetes Coordinator Inpatient Diabetes Program [phone] (team pager from 8a-5p)

## 2020-08-27 DIAGNOSIS — E875 Hyperkalemia: Secondary | ICD-10-CM | POA: Diagnosis not present

## 2020-08-27 DIAGNOSIS — I10 Essential (primary) hypertension: Secondary | ICD-10-CM

## 2020-08-27 DIAGNOSIS — U071 COVID-19: Secondary | ICD-10-CM | POA: Diagnosis not present

## 2020-08-27 DIAGNOSIS — N179 Acute kidney failure, unspecified: Secondary | ICD-10-CM

## 2020-08-27 LAB — GLUCOSE, CAPILLARY
Glucose-Capillary: 221 mg/dL — ABNORMAL HIGH (ref 70–99)
Glucose-Capillary: 331 mg/dL — ABNORMAL HIGH (ref 70–99)
Glucose-Capillary: 247 mg/dL — ABNORMAL HIGH (ref 70–99)
Glucose-Capillary: 280 mg/dL — ABNORMAL HIGH (ref 70–99)

## 2020-08-27 LAB — D-DIMER, QUANTITATIVE: D-Dimer, Quant: 1.64 ug/mL-FEU — ABNORMAL HIGH (ref 0.00–0.50)

## 2020-08-27 MED ORDER — INSULIN DETEMIR 100 UNIT/ML ~~LOC~~ SOLN
18.0000 [IU] | Freq: Two times a day (BID) | SUBCUTANEOUS | Status: DC
  Administered 2020-08-27 – 2020-09-03 (×14): 18 [IU] via SUBCUTANEOUS
  Filled 2020-08-27 (×15): qty 0.18

## 2020-08-27 MED ORDER — INSULIN ASPART 100 UNIT/ML ~~LOC~~ SOLN
6.0000 [IU] | Freq: Three times a day (TID) | SUBCUTANEOUS | Status: DC
  Administered 2020-08-27 – 2020-09-03 (×20): 6 [IU] via SUBCUTANEOUS

## 2020-08-27 NOTE — Progress Notes (Signed)
PROGRESS NOTE                                                                                                                                                                                                             Patient Demographics:    [name] [name], is a 65 y.o. female, DOB - Jul 01, 1956, BJS:[id number]  Outpatient Primary MD for the patient is Glendale Chard, MD   Admit date - 08/18/2020   LOS - 8  Chief Complaint  Patient presents with  . Covid Positive  . Chest Pain  . Weakness       Brief Narrative: Patient is a 65 y.o. female with PMHx of renal transplant x2 (latest in 2016) on chronic immunosuppressive agents, DM-2, hypothyroidism-who presented with shortness of breath-found to have acute hypoxic respiratory failure due to COVID-19 pneumonia.  COVID-19 vaccinated status: Vaccinated including booster  Significant Events: 1/10>> Admit to [facility] for acute hypoxic respiratory failure due to COVID-19 pneumonia 1/11>> transfer to ICU due to worsening hypoxemia, AKI/metabolic acidosis/hyperkalemia. 1/14>> transferred to [facility]  Significant studies: 1/10>> chest x-ray: Severe multilobar bilateral pneumonia. 1/11>> ultrasound renal transplant: No hydronephrosis 1/16>> CT chest: Multifocal viral pneumonia  COVID-19 medications: Steroids: 1/10>> Remdesivir: 1/10>> Baricitinib: 1/16>>  Antibiotics: Rocephin: 1/12>> 1/18  Microbiology data: 1/10 >>blood culture: No growth 1/11>> urine culture: Klebsiella pneumoniae  Procedures: None  Consults: PCCM, nephrology  DVT prophylaxis: heparin injection 7,500 Units Start: 08/25/20 1400 SCDs Start: 08/19/20 6073    Subjective:    Brookelyn Gaynor today remains essentially unchanged-no major issues overnight.   Assessment  & Plan :   Acute Hypoxic Resp Failure due to Covid 19 Viral pneumonia: Continues to have severe hypoxemia-remains on steroids and  baricitinib.  Continue supportive care-continue attempts to slowly titrate down FiO2.  Fever: afebrile O2 requirements:  SpO2: 91 % O2 Flow Rate (L/min): 25 L/min FiO2 (%): 50 %   COVID-19 Labs: Recent Labs    08/25/20 0117 08/26/20 0111 08/27/20 0239  DDIMER 1.88* 1.39* 1.64*    No results found for: BNP  Recent Labs  Lab 08/23/20 1039 08/25/20 0117 08/26/20 0111  PROCALCITON 0.26 0.12 <0.10    Lab Results  Component Value Date   SARSCOV2NAA NEGATIVE 04/27/2020   Arena Not Detected 01/30/2019     Prone/Incentive Spirometry: encouraged  incentive spirometry use 3-4/hour.  AKI on  CKD stage IV-transplanted kidney: AKI hemodynamically mediated-slowly improving-creatinine slowly trending down to usual baseline.  Nephrology following and directing care.  History of renal transplant (second renal transplant-01/2015): On Prograf-Myfortic on hold.  Hyperkalemia/anion gap metabolic acidosis: Secondary to AKI-resolved.  Complicated UTI: Completed a course of Rocephin.  Afebrile and nontoxic-appearing.  DKA: Resolved-no longer on insulin drip.  DM-2 (A1c 8.2 on 1/11) with uncontrolled hyperglycemia due to steroids: CBGs remain uncontrolled-increase Levemir to 18 units twice daily, increase Premeal NovoLog to 6 units-continue SSI and follow.  Recent Labs    08/26/20 2131 08/27/20 0743 08/27/20 1155  GLUCAP 321* 221* 331*    Hypothyroidism: Continue levothyroxine  HTN: BP stable-continue manidipine, Lopressor and hydralazine  HLD: Continue statin  Obesity: Estimated body mass index is 36.53 kg/m as calculated from the following:   Height as of this encounter: 5\' 2"  (1.575 m).   Weight as of this encounter: 90.6 kg.   RN pressure injury documentation: Pressure Injury 08/20/20 Coccyx Mid Stage 1 -  Intact skin with non-blanchable redness of a localized area usually over a bony prominence. (Active)  08/20/20 0800  Location: Coccyx  Location Orientation: Mid   Staging: Stage 1 -  Intact skin with non-blanchable redness of a localized area usually over a bony prominence.  Wound Description (Comments):   Present on Admission: Yes    GI prophylaxis: PPI  ABG:    Component Value Date/Time   PHART 7.266 (L) 08/19/2020 0808   PCO2ART 32.3 08/19/2020 0808   PO2ART 186 (H) 08/19/2020 0808   HCO3 19.0 (L) 08/20/2020 1101   TCO2 16 (L) 08/19/2020 0808   ACIDBASEDEF 6.4 (H) 08/20/2020 1101   O2SAT 85.1 08/20/2020 1101    Vent Settings: N/A FiO2 (%):  [40 %-50 %] 50 %  Condition - Extremely Guarded  Family Communication  :  daughter Mendel Ryder [phone] updated on 1/19  Code Status :  Full Code  Diet :  Diet Order            Diet Carb Modified Fluid consistency: Thin; Room service appropriate? Yes  Diet effective now                  Disposition Plan  :   Status is: Inpatient  Remains inpatient appropriate because:Inpatient level of care appropriate due to severity of illness   Dispo: The patient is from: Home              Anticipated d/c is to: SNF              Anticipated d/c date is: > 3 days              Patient currently is not medically stable to d/c.   Barriers to discharge: Hypoxia requiring O2 supplementation  Antimicorbials  :    Anti-infectives (From admission, onward)   Start     Dose/Rate Route Frequency Ordered Stop   08/20/20 1445  cefTRIAXone (ROCEPHIN) 1 g in sodium chloride 0.9 % 100 mL IVPB        1 g 200 mL/hr over 30 Minutes Intravenous Every 24 hours 08/20/20 1348 08/26/20 2147   08/20/20 1000  remdesivir 100 mg in sodium chloride 0.9 % 100 mL IVPB       "Followed by" Linked Group Details   100 mg 200 mL/hr over 30 Minutes Intravenous Daily 08/19/20 0021 08/23/20 1113   08/19/20 0130  remdesivir 200 mg in sodium chloride 0.9% 250 mL IVPB       "  Followed by" Linked Group Details   200 mg 580 mL/hr over 30 Minutes Intravenous Once 08/19/20 0021 08/19/20 0314      Inpatient  Medications  Scheduled Meds: . amLODipine  10 mg Oral Daily  . vitamin C  500 mg Oral Daily  . aspirin EC  81 mg Oral Daily  . baricitinib  1 mg Oral Daily  . Chlorhexidine Gluconate Cloth  6 each Topical Daily  . feeding supplement (GLUCERNA SHAKE)  237 mL Oral TID BM  . feeding supplement (NEPRO CARB STEADY)  237 mL Oral BID BM  . fluticasone  1 spray Each Nare Daily  . heparin  7,500 Units Subcutaneous Q8H  . hydrALAZINE  25 mg Oral Q8H  . insulin aspart  0-15 Units Subcutaneous TID WC  . insulin aspart  4 Units Subcutaneous TID WC  . insulin detemir  16 Units Subcutaneous BID  . levothyroxine  75 mcg Oral Q0600  . mouth rinse  15 mL Mouth Rinse BID  . methylPREDNISolone (SOLU-MEDROL) injection  60 mg Intravenous Q12H  . metoprolol tartrate  75 mg Oral BID  . pantoprazole  40 mg Oral Daily  . simvastatin  10 mg Oral q1800  . sodium bicarbonate  650 mg Oral TID  . tacrolimus  3 mg Oral QHS  . tacrolimus  4 mg Oral Daily  . zinc sulfate  220 mg Oral Daily   Continuous Infusions: . sodium chloride     PRN Meds:.sodium chloride, acetaminophen **OR** acetaminophen, docusate sodium, guaiFENesin, hydrALAZINE, labetalol, loperamide, polyethylene glycol   Time Spent in minutes  35    See all Orders from today for further details   Oren Binet M.D on 08/27/2020 at 2:45 PM  To page go to [email or web address] - use universal password  [facility] -  Office  [phone]    Objective:   Vitals:   08/27/20 0351 08/27/20 0414 08/27/20 0836 08/27/20 1416  BP:  (!) 140/58    Pulse:  73    Resp:  20    Temp:  97.6 F (36.4 C)    TempSrc:  Oral    SpO2: (!) 88% 94% 91% 91%  Weight:      Height:        Wt Readings from Last 3 Encounters:  08/26/20 90.6 kg  05/14/20 92.5 kg  05/14/20 92.9 kg     Intake/Output Summary (Last 24 hours) at 08/27/2020 1445 Last data filed at 08/27/2020 1349 Gross per 24 hour  Intake 600 ml  Output -  Net 600 ml     Physical  Exam Gen Exam:Alert awake-not in any distress HEENT:atraumatic, normocephalic Chest: B/L clear to auscultation anteriorly CVS:S1S2 regular Abdomen:soft non tender, non distended Extremities:no edema Neurology: Non focal Skin: no rash   Data Review:    CBC Recent Labs  Lab 08/22/20 0129 08/23/20 0609 08/24/20 0246 08/25/20 0117 08/26/20 0111  WBC 7.6 5.7 11.3* 8.6 10.1  HGB 10.9* 10.8* 11.4* 11.3* 10.4*  HCT 33.6* 33.2* 33.9* 33.8* 31.0*  PLT 120* 145* 144* 152 132*  MCV 74.7* 75.5* 74.7* 77.2* 74.9*  MCH 24.2* 24.5* 25.1* 25.8* 25.1*  MCHC 32.4 32.5 33.6 33.4 33.5  RDW 14.5 14.3 14.4 14.5 14.1  LYMPHSABS 0.3* 0.3* 0.3* 0.4* 0.4*  MONOABS 0.5 0.2 0.5 0.4 0.5  EOSABS 0.0 0.0 0.0 0.0 0.0  BASOSABS 0.0 0.0 0.0 0.0 0.0    Chemistries  Recent Labs  Lab 08/22/20 0129 08/23/20 0314 08/24/20 0246 08/25/20 0117 08/26/20 0111 08/26/20 0526  NA 145 140 141 136 135  --   K 4.5 4.8 4.4 4.3 4.1  --   CL 106 104 103 99 98  --   CO2 25 21* 26 24 24   --   GLUCOSE 122* 177* 64* 316* 298*  --   BUN 88* 83* 75* 69* 68*  --   CREATININE 2.44* 2.25* 2.08* 1.88* 1.96*  --   CALCIUM 9.7 9.6 9.9 9.5 9.3  --   MG  --   --   --   --   --  2.1  AST 24 27 24 17 16   --   ALT 15 12 16 15 14   --   ALKPHOS 57 67 66 74 66  --   BILITOT 0.5 0.7 0.9 0.8 0.8  --    ------------------------------------------------------------------------------------------------------------------ No results for input(s): CHOL, HDL, LDLCALC, TRIG, CHOLHDL, LDLDIRECT in the last 72 hours.  Lab Results  Component Value Date   HGBA1C 8.2 (H) 08/19/2020   ------------------------------------------------------------------------------------------------------------------ No results for input(s): TSH, T4TOTAL, T3FREE, THYROIDAB in the last 72 hours.  Invalid input(s): FREET3 ------------------------------------------------------------------------------------------------------------------ No results for input(s):  VITAMINB12, FOLATE, FERRITIN, TIBC, IRON, RETICCTPCT in the last 72 hours.  Coagulation profile No results for input(s): INR, PROTIME in the last 168 hours.  Recent Labs    08/26/20 0111 08/27/20 0239  DDIMER 1.39* 1.64*    Cardiac Enzymes No results for input(s): CKMB, TROPONINI, MYOGLOBIN in the last 168 hours.  Invalid input(s): CK ------------------------------------------------------------------------------------------------------------------ No results found for: BNP  Micro Results Recent Results (from the past 240 hour(s))  Blood Culture (routine x 2)     Status: None   Collection Time: 08/18/20  8:56 PM   Specimen: BLOOD  Result Value Ref Range Status   Specimen Description BLOOD SITE NOT SPECIFIED  Final   Special Requests   Final    BOTTLES DRAWN AEROBIC ONLY Blood Culture results may not be optimal due to an inadequate volume of blood received in culture bottles   Culture   Final    NO GROWTH 5 DAYS Performed at [facility], [place] [address] [place], [place] [address]    Report Status 08/23/2020 FINAL  Final  Blood Culture (routine x 2)     Status: None   Collection Time: 08/18/20  8:56 PM   Specimen: BLOOD  Result Value Ref Range Status   Specimen Description BLOOD SITE NOT SPECIFIED  Final   Special Requests   Final    BOTTLES DRAWN AEROBIC AND ANAEROBIC Blood Culture results may not be optimal due to an inadequate volume of blood received in culture bottles   Culture   Final    NO GROWTH 5 DAYS Performed at [facility], [place] [address] [place], [place] [address]    Report Status 08/23/2020 FINAL  Final  Culture, Urine     Status: Abnormal   Collection Time: 08/19/20 10:07 AM   Specimen: Urine, Random  Result Value Ref Range Status   Specimen Description URINE, RANDOM  Final   Special Requests   Final    NONE Performed at [facility], [place] [address], [place], [place] [address]    Culture >=100,000 COLONIES/mL KLEBSIELLA PNEUMONIAE  (A)  Final   Report Status 08/21/2020 FINAL  Final   Organism ID, Bacteria KLEBSIELLA PNEUMONIAE (A)  Final      Susceptibility   Klebsiella pneumoniae - MIC*    AMPICILLIN >=32 RESISTANT Resistant     CEFAZOLIN <=4 SENSITIVE  Sensitive     CEFEPIME <=0.12 SENSITIVE Sensitive     CEFTRIAXONE <=0.25 SENSITIVE Sensitive     CIPROFLOXACIN <=0.25 SENSITIVE Sensitive     GENTAMICIN <=1 SENSITIVE Sensitive     IMIPENEM <=0.25 SENSITIVE Sensitive     NITROFURANTOIN 32 SENSITIVE Sensitive     TRIMETH/SULFA <=20 SENSITIVE Sensitive     AMPICILLIN/SULBACTAM 8 SENSITIVE Sensitive     PIP/TAZO <=4 SENSITIVE Sensitive     * >=100,000 COLONIES/mL KLEBSIELLA PNEUMONIAE  MRSA PCR Screening     Status: None   Collection Time: 08/20/20 12:22 AM   Specimen: Nasopharyngeal  Result Value Ref Range Status   MRSA by PCR NEGATIVE NEGATIVE Final    Comment:        The GeneXpert MRSA Assay (FDA approved for NASAL specimens only), is one component of a comprehensive MRSA colonization surveillance program. It is not intended to diagnose MRSA infection nor to guide or monitor treatment for MRSA infections. Performed at [facility], [place] [address] [place], [place] [address]     Radiology Reports CT CHEST WO CONTRAST  Result Date: 08/24/2020 CLINICAL DATA:  Severe hypoxia and respiratory failure with COVID-19 infection. EXAM: CT CHEST WITHOUT CONTRAST TECHNIQUE: Multidetector CT imaging of the chest was performed following the standard protocol without IV contrast. COMPARISON:  Chest x-ray 08/18/2020 FINDINGS: Cardiovascular: Mild cardiomegaly. Mild calcification over the mitral valve annulus. Thoracic aorta is normal caliber. Stent over the right subclavian/axillary region. Remaining vascular structures are unremarkable. Mediastinum/Nodes: No evidence of mediastinal or hilar adenopathy. Remaining mediastinal structures are unremarkable. Lungs/Pleura: Lungs are adequately inflated demonstrate moderate  bilateral hazy airspace process likely multifocal viral pneumonia in this COVID-19 positive patient. No evidence of effusion. Airways are otherwise unremarkable. Upper Abdomen: No acute findings. Musculoskeletal: Degenerative change of the spine. Mild anterior wedging of a midthoracic vertebral body likely chronic. IMPRESSION: 1. Moderate bilateral hazy airspace process likely multifocal viral pneumonia in this COVID-19 positive patient. 2. Mild cardiomegaly. 3. Mild anterior wedging of a midthoracic vertebral body likely chronic. Electronically Signed   By: Marin Olp M.D.   On: 08/24/2020 12:37   US Renal Transplant w/Doppler  Result Date: 08/19/2020 CLINICAL DATA:  Acute kidney injury EXAM: ULTRASOUND OF RENAL TRANSPLANT WITH RENAL DOPPLER ULTRASOUND TECHNIQUE: Ultrasound examination of the renal transplant was performed with gray-scale, color and duplex doppler evaluation. COMPARISON:  None. FINDINGS: Transplant kidney location: Left lower quadrant Transplant Kidney: Renal measurements: 10.8 x 6.2 x 6.5 cm = volume: 267mL. Normal in size and parenchymal echogenicity. No evidence of mass or hydronephrosis. No peri-transplant fluid collection seen. Color flow in the main renal artery:  Yes Color flow in the main renal vein:  Yes Duplex Doppler Evaluation: Main Renal Artery Velocity:  cm/sec Main Renal Artery Resistive Index: 0.99 Venous waveform in main renal vein:  Present Intrarenal resistive index in upper pole:  0.98 (normal 0.6-0.8; equivocal 0.8-0.9; abnormal >= 0.9) Intrarenal resistive index in lower pole: 0.98 (normal 0.6-0.8; equivocal 0.8-0.9; abnormal >= 0.9) Bladder: Normal for degree of bladder distention. Other findings:  None. IMPRESSION: Elevated resistive indices within the left lower quadrant transplant kidney. No hydronephrosis. Electronically Signed   By: Rolm Baptise M.D.   On: 08/19/2020 19:15   DG Chest Portable 1 View  Result Date: 08/18/2020 CLINICAL DATA:  65 year old female  with history of COVID infection. Chest pain. Shortness of breath. EXAM: PORTABLE CHEST 1 VIEW COMPARISON:  Chest x-ray 10/20/2014. FINDINGS: Patchy multifocal airspace consolidation and areas of interstitial prominence are  noted throughout the lungs bilaterally, most severe throughout the periphery of the mid to lower left lung, indicative of multilobar bilateral pneumonia. No definite pleural effusions. No evidence of pulmonary edema. No pneumothorax. Heart size is borderline enlarged. Upper mediastinal contours are within normal limits. Atherosclerotic calcifications in the thoracic aorta. Vascular stent projecting over the right subclavian region. IMPRESSION: 1. Severe multilobar bilateral (left greater than right) pneumonia compatible with reported COVID infection. 2. Aortic atherosclerosis. Electronically Signed   By: Vinnie Langton M.D.   On: 08/18/2020 18:02

## 2020-08-27 NOTE — Progress Notes (Signed)
Occupational Therapy Treatment Patient Details Name: [name] [name] MRN: [id number] DOB: 1955/09/16 Today's Date: 08/27/2020    History of present illness Pt is a 65 y.o. female who tested (+) COVID-19 on 08/13/20, now admitted 08/18/20 with worsening SOB, cough, chest pain and body aches. Workup for acute hypoxic respiratory failure due to COVID-19 PNA, DKA, AKI on CKD, UTI. PMH includes HTN, DM2, chronic steroid use, s/p renal transplant x2.   OT comments  Pt is making slow, but steady progress with OT.  She was able to perform bed mobility with min guard to mod A (as she fatigued).  Bil. UE exercise EOB with rest breaks, transferred to [facility] with min A, and performed peri care with A.  Encouraged IS and flutter valve.  Pt on 25L 50% Fi02 via heated hiflo.  Sp02 decreased to 84% with activity, but rebounded to 95% with pursed lip breathing and short rest break.  Recommend SNF  Vs LTACH (depending on 02 requirements.  Will continue to follow.   Follow Up Recommendations  SNF;LTACH;Supervision/Assistance - 24 hour    Equipment Recommendations  3 in 1 bedside commode    Recommendations for Other Services      Precautions / Restrictions Precautions Precautions: Fall;Other (comment) Precaution Comments: Watch SpO2 on HHFNC       Mobility Bed Mobility Overal bed mobility: Needs Assistance Bed Mobility: Supine to Sit;Sit to Supine     Supine to sit: HOB elevated;Min guard Sit to supine: HOB elevated;Mod assist   General bed mobility comments: assist to lift LEs into the bed due to fatigue  Transfers Overall transfer level: Needs assistance Equipment used: Rolling walker (2 wheeled) Transfers: Sit to/from [facility] Sit to Stand: Min assist Stand pivot transfers: Min guard       General transfer comment: min A to power up into standing    Balance Overall balance assessment: Needs assistance Sitting-balance support: Feet supported Sitting balance-Leahy  Scale: Fair     Standing balance support: Single extremity supported;Bilateral upper extremity supported Standing balance-Leahy Scale: Poor Standing balance comment: Reliant on UE support, dependent for posterior pericare while standing                           ADL either performed or assessed with clinical judgement   ADL Overall ADL's : Needs assistance/impaired                         Toilet Transfer: Minimal assistance;Stand-pivot;BSC;RW Toilet Transfer Details (indicate cue type and reason): assist to power up into standing and verbal cues for walker safety Toileting- Clothing Manipulation and Hygiene: Minimal assistance;Sit to/from stand       Functional mobility during ADLs: Minimal assistance;Rolling walker       Vision       Perception     Praxis      Cognition Arousal/Alertness: Awake/alert Behavior During Therapy: WFL for tasks assessed/performed;Flat affect Overall Cognitive Status: Impaired/Different from baseline Area of Impairment: Following commands;Problem solving                       Following Commands: Follows multi-step commands with increased time     Problem Solving: Slow processing;Requires verbal cues          Exercises Exercises: General Upper Extremity;Other exercises General Exercises - Upper Extremity Shoulder Flexion: AROM;Right;Left;Seated;20 reps Other Exercises Other Exercises: Performed IS 600ML x 10 with encouragement, and  flutter x 10.   Shoulder Instructions       General Comments Pt on 25L Heated HFNC 50% Fi02 with Sp02 95%, Sp02 decreased to 84% with activity, but rebounded with pursed lip breathing    Pertinent Vitals/ Pain       Pain Assessment: No/denies pain  Home Living                                          Prior Functioning/Environment              Frequency  Min 2X/week        Progress Toward Goals  OT Goals(current goals can now be found in  the care plan section)  Progress towards OT goals: Progressing toward goals (slowly)     Plan Discharge plan remains appropriate;Discharge plan needs to be updated    Co-evaluation                 AM-PAC OT "6 Clicks" Daily Activity     Outcome Measure   Help from another person eating meals?: None Help from another person taking care of personal grooming?: A Little Help from another person toileting, which includes using toliet, bedpan, or urinal?: A Lot Help from another person bathing (including washing, rinsing, drying)?: A Lot Help from another person to put on and taking off regular upper body clothing?: A Little Help from another person to put on and taking off regular lower body clothing?: A Lot 6 Click Score: 16    End of Session Equipment Utilized During Treatment: Rolling walker;Oxygen  OT Visit Diagnosis: Muscle weakness (generalized) (M62.81);Unsteadiness on feet (R26.81)   Activity Tolerance Patient limited by fatigue   Patient Left in bed;with call bell/phone within reach   Nurse Communication Mobility status        Time: 7048-8891 OT Time Calculation (min): 38 min  Charges: OT General Charges $OT Visit: 1 Visit OT Treatments $Self Care/Home Management : 8-22 mins $Therapeutic Activity: 23-37 mins  Nilsa Nutting., OTR/L Acute Rehabilitation Services Pager [phone] Office [place], [place] 08/27/2020, 5:00 PM

## 2020-08-27 NOTE — Progress Notes (Signed)
Inpatient Diabetes Program Recommendations  AACE/ADA: New Consensus Statement on Inpatient Glycemic Control (2015)  Target Ranges:  Prepandial:   less than 140 mg/dL      Peak postprandial:   less than 180 mg/dL (1-2 hours)      Critically ill patients:  140 - 180 mg/dL   Lab Results  Component Value Date   GLUCAP 331 (H) 08/27/2020   HGBA1C 8.2 (H) 08/19/2020    Review of Glycemic Control  Diabetes history: DM2 Outpatient Diabetes medications: Levemir 30 units QAM and 35 units QHS, Novolog 4 units TID with meals Current orders for Inpatient glycemic control: Levemir 16 units BID, Novolog 0-15 units TID with meals + 4 units TID for meal coverage  Inpatient Diabetes Program Recommendations:     Increase Levemir to 18 units BID Increase Novolog meal coverage to 6 units TID for meal coverage. Add Novolog 0-5 units HS.  Continue to follow glucose trends.  Thank you. Lorenda Peck, RD, LDN, CDE Inpatient Diabetes Coordinator [phone]

## 2020-08-28 DIAGNOSIS — I10 Essential (primary) hypertension: Secondary | ICD-10-CM | POA: Diagnosis not present

## 2020-08-28 DIAGNOSIS — E875 Hyperkalemia: Secondary | ICD-10-CM | POA: Diagnosis not present

## 2020-08-28 DIAGNOSIS — N179 Acute kidney failure, unspecified: Secondary | ICD-10-CM | POA: Diagnosis not present

## 2020-08-28 DIAGNOSIS — U071 COVID-19: Secondary | ICD-10-CM | POA: Diagnosis not present

## 2020-08-28 LAB — CBC WITH DIFFERENTIAL/PLATELET
Abs Immature Granulocytes: 0.84 10*3/uL — ABNORMAL HIGH (ref 0.00–0.07)
Basophils Absolute: 0 10*3/uL (ref 0.0–0.1)
Basophils Relative: 0 %
Eosinophils Absolute: 0 10*3/uL (ref 0.0–0.5)
Eosinophils Relative: 0 %
HCT: 33 % — ABNORMAL LOW (ref 36.0–46.0)
Hemoglobin: 10.8 g/dL — ABNORMAL LOW (ref 12.0–15.0)
Immature Granulocytes: 7 %
Lymphocytes Relative: 3 %
Lymphs Abs: 0.4 10*3/uL — ABNORMAL LOW (ref 0.7–4.0)
MCH: 24.5 pg — ABNORMAL LOW (ref 26.0–34.0)
MCHC: 32.7 g/dL (ref 30.0–36.0)
MCV: 75 fL — ABNORMAL LOW (ref 80.0–100.0)
Monocytes Absolute: 0.5 10*3/uL (ref 0.1–1.0)
Monocytes Relative: 4 %
Neutro Abs: 10.1 10*3/uL — ABNORMAL HIGH (ref 1.7–7.7)
Neutrophils Relative %: 86 %
Platelets: 138 10*3/uL — ABNORMAL LOW (ref 150–400)
RBC: 4.4 MIL/uL (ref 3.87–5.11)
RDW: 14.2 % (ref 11.5–15.5)
WBC: 11.8 10*3/uL — ABNORMAL HIGH (ref 4.0–10.5)
nRBC: 0 % (ref 0.0–0.2)

## 2020-08-28 LAB — COMPREHENSIVE METABOLIC PANEL
ALT: 24 U/L (ref 0–44)
AST: 18 U/L (ref 15–41)
Albumin: 2.7 g/dL — ABNORMAL LOW (ref 3.5–5.0)
Alkaline Phosphatase: 66 U/L (ref 38–126)
Anion gap: 12 (ref 5–15)
BUN: 67 mg/dL — ABNORMAL HIGH (ref 8–23)
CO2: 25 mmol/L (ref 22–32)
Calcium: 9.9 mg/dL (ref 8.9–10.3)
Chloride: 101 mmol/L (ref 98–111)
Creatinine, Ser: 1.91 mg/dL — ABNORMAL HIGH (ref 0.44–1.00)
GFR, Estimated: 29 mL/min — ABNORMAL LOW (ref >60)
Glucose, Bld: 208 mg/dL — ABNORMAL HIGH (ref 70–99)
Potassium: 4.1 mmol/L (ref 3.5–5.1)
Sodium: 138 mmol/L (ref 135–145)
Total Bilirubin: 0.8 mg/dL (ref 0.3–1.2)
Total Protein: 6.4 g/dL — ABNORMAL LOW (ref 6.5–8.1)

## 2020-08-28 LAB — D-DIMER, QUANTITATIVE: D-Dimer, Quant: 1.55 ug/mL-FEU — ABNORMAL HIGH (ref 0.00–0.50)

## 2020-08-28 LAB — GLUCOSE, CAPILLARY
Glucose-Capillary: 174 mg/dL — ABNORMAL HIGH (ref 70–99)
Glucose-Capillary: 321 mg/dL — ABNORMAL HIGH (ref 70–99)
Glucose-Capillary: 469 mg/dL — ABNORMAL HIGH (ref 70–99)
Glucose-Capillary: 384 mg/dL — ABNORMAL HIGH (ref 70–99)
Glucose-Capillary: 315 mg/dL — ABNORMAL HIGH (ref 70–99)

## 2020-08-28 NOTE — Progress Notes (Signed)
Physical Therapy Treatment Patient Details Name: [name] [name] MRN: [id number] DOB: 04/23/1956 Today's Date: 08/28/2020    History of Present Illness Pt is a 65 y.o. female who tested (+) COVID-19 on 08/13/20, now admitted 08/18/20 with worsening SOB, cough, chest pain and body aches. Workup for acute hypoxic respiratory failure due to COVID-19 PNA, DKA, AKI on CKD, UTI. PMH includes HTN, DM2, chronic steroid use, s/p renal transplant x2.   PT Comments    Pt slowly progressing with mobility. Remains motivated to participate, but continues to be limited by significant impaired activity tolerance and cardiorespiratory status, as well as generalized weakness. Pt able to perform short bouts of activity with RW and minA, but quick to fatigue with DOE 4/4, requiring prolonged seated rest to recover. Recommend SNF vs. LTACH-level therapies pending O2 needs at d/c.   SpO2 down to 78% on 25L O2 HHFNC at 40% FiO2, rebounding well with seated rest and pursed lip breathing    Follow Up Recommendations  SNF;LTACH;Supervision/Assistance - 24 hour     Equipment Recommendations   (TBD)    Recommendations for Other Services       Precautions / Restrictions Precautions Precautions: Fall;Other (comment) Precaution Comments: Watch SpO2 - quick to desaturate on HHFNC Restrictions Weight Bearing Restrictions: No    Mobility  Bed Mobility Overal bed mobility: Needs Assistance Bed Mobility: Supine to Sit     Supine to sit: Supervision;HOB elevated     General bed mobility comments: Increased time and effort with use of bed rail; SpO2 down to 85% on 25L HHFNC  Transfers Overall transfer level: Needs assistance Equipment used: Rolling walker (2 wheeled) Transfers: Sit to/from Stand Sit to Stand: Min assist         General transfer comment: Multiple sit<>stands to RW, 1x from EOB, 2x from [facility] and 1x from recliner, consistent minA for trunk elevation and stability, cues for hand  placement; poor eccentric control into sitting, very fatigued  Ambulation/Gait Ambulation/Gait assistance: Min assist Gait Distance (Feet): 4 Feet Assistive device: Rolling walker (2 wheeled) Gait Pattern/deviations: Step-to pattern;Trunk flexed Gait velocity: Decreased   General Gait Details: Slow, labored steps from bed to [facility], then further distance from [facility] to recliner with RW and minA for stability; pt limited by fatigue and DOE 4/4 requiring seated rest; SpO2 down to 78%   Stairs             Wheelchair Mobility    Modified Rankin (Stroke Patients Only)       Balance Overall balance assessment: Needs assistance Sitting-balance support: Feet supported Sitting balance-Leahy Scale: Fair     Standing balance support: Single extremity supported;Bilateral upper extremity supported Standing balance-Leahy Scale: Poor Standing balance comment: Reliant on UE support, dependent for posterior pericare while standing                            Cognition Arousal/Alertness: Awake/alert Behavior During Therapy: WFL for tasks assessed/performed;Flat affect Overall Cognitive Status: Within Functional Limits for tasks assessed Area of Impairment: Following commands;Problem solving;Awareness;Attention                   Current Attention Level: Selective   Following Commands: Follows one step commands consistently;Follows one step commands with increased time   Awareness: Emergent Problem Solving: Slow processing;Requires verbal cues        Exercises      General Comments General comments (skin integrity, edema, etc.): SpO2 down to 78% with  activity on 25L O2 HHFNC at 40% FiO2, rebounding well with seated rest and pursed lip breathing      Pertinent Vitals/Pain Pain Assessment: No/denies pain Pain Intervention(s): Monitored during session    Home Living                      Prior Function            PT Goals (current goals can now be  found in the care plan section) Progress towards PT goals: Progressing toward goals (slowly)    Frequency    Min 2X/week      PT Plan Frequency needs to be updated    Co-evaluation              AM-PAC PT "6 Clicks" Mobility   Outcome Measure  Help needed turning from your back to your side while in a flat bed without using bedrails?: A Little Help needed moving from lying on your back to sitting on the side of a flat bed without using bedrails?: A Little Help needed moving to and from a bed to a chair (including a wheelchair)?: A Little Help needed standing up from a chair using your arms (e.g., wheelchair or bedside chair)?: A Little Help needed to walk in hospital room?: A Lot Help needed climbing 3-5 steps with a railing? : A Lot 6 Click Score: 16    End of Session Equipment Utilized During Treatment: Oxygen Activity Tolerance: Patient limited by fatigue;Patient tolerated treatment well;Treatment limited secondary to medical complications (Comment) (desaturations/DOE) Patient left: in chair;with call bell/phone within reach;with chair alarm set Nurse Communication: Mobility status PT Visit Diagnosis: Other abnormalities of gait and mobility (R26.89);Muscle weakness (generalized) (M62.81)     Time: 9326-7124 PT Time Calculation (min) (ACUTE ONLY): 28 min  Charges:  $Therapeutic Activity: 23-37 mins                     Mabeline Caras, PT, DPT Acute Rehabilitation Services  Pager [phone] Office [phone]  Derry Lory 08/28/2020, 2:05 PM

## 2020-08-28 NOTE — Progress Notes (Signed)
Informed by RN-but patient having sinus pauses-up to 4.25 seconds-she is asymptomatic-per RN-this occurred when she was sleeping.  Reviewed telemetry-confirmed sinus pauses.  I stopped her metoprolol.  We will check TSH with morning labs.  Case briefly discussed with cardiologist-Dr. Vira Blanco that we continue to monitor her closely-and post discharge to consider outpatient cardiac monitoring.  We will continue to monitor closely

## 2020-08-28 NOTE — Progress Notes (Signed)
PROGRESS NOTE                                                                                                                                                                                                             Patient Demographics:    [name] [name], is a 65 y.o. female, DOB - 1956-01-06, TDD:[id number]  Outpatient Primary MD for the patient is Glendale Chard, MD   Admit date - 08/18/2020   LOS - 9  Chief Complaint  Patient presents with  . Covid Positive  . Chest Pain  . Weakness       Brief Narrative: Patient is a 65 y.o. female with PMHx of renal transplant x2 (latest in 2016) on chronic immunosuppressive agents, DM-2, hypothyroidism-who presented with shortness of breath-found to have acute hypoxic respiratory failure due to COVID-19 pneumonia.  COVID-19 vaccinated status: Vaccinated including booster  Significant Events: 1/10>> Admit to [facility] for acute hypoxic respiratory failure due to COVID-19 pneumonia 1/11>> transfer to ICU due to worsening hypoxemia, AKI/metabolic acidosis/hyperkalemia. 1/14>> transferred to [facility]  Significant studies: 1/10>> chest x-ray: Severe multilobar bilateral pneumonia. 1/11>> ultrasound renal transplant: No hydronephrosis 1/16>> CT chest: Multifocal viral pneumonia  COVID-19 medications: Steroids: 1/10>> Remdesivir: 1/10>> Baricitinib: 1/16>>  Antibiotics: Rocephin: 1/12>> 1/18  Microbiology data: 1/10 >>blood culture: No growth 1/11>> urine culture: Klebsiella pneumoniae  Procedures: None  Consults: PCCM, nephrology  DVT prophylaxis: heparin injection 7,500 Units Start: 08/25/20 1400 SCDs Start: 08/19/20 0933    Subjective:   No major issues overnight-remains unchanged-on heated high flow.   Assessment  & Plan :   Acute Hypoxic Resp Failure due to Covid 19 Viral pneumonia: Continues to have severe hypoxemia on heated high flow-on  steroids/baricitinib.  Volume status appears stable.  Continue supportive care and attempt to slowly titrate down FiO2.   Fever: afebrile O2 requirements:  SpO2: 92 % O2 Flow Rate (L/min): 225 L/min FiO2 (%): 40 %   COVID-19 Labs: Recent Labs    08/26/20 0111 08/27/20 0239 08/28/20 0551  DDIMER 1.39* 1.64* 1.55*    No results found for: BNP  Recent Labs  Lab 08/23/20 1039 08/25/20 0117 08/26/20 0111  PROCALCITON 0.26 0.12 <0.10    Lab Results  Component Value Date   SARSCOV2NAA NEGATIVE 04/27/2020   [place] Not Detected 01/30/2019     Prone/Incentive Spirometry: encouraged  incentive  spirometry use 3-4/hour.  AKI on CKD stage IV-transplanted kidney: AKI hemodynamically mediated-creatinine improved but seems to have plateaued lately.  Nephrology following and directing care.   History of renal transplant (second renal transplant-01/2015): Remains on Prograf-on steroids-Myfortic on hold.  Nephrology following.    Hyperkalemia/anion gap metabolic acidosis: Secondary to AKI-resolved.  Complicated UTI: Completed a course of Rocephin.  Afebrile and nontoxic-appearing.  DKA: Resolved-no longer on insulin drip.  DM-2 (A1c 8.2 on 1/11) with uncontrolled hyperglycemia due to steroids: CBGs controlled slowly improving-regimen just adjusted yesterday-continue Levemir 18 units twice daily, NovoLog 6 units with meals and SSI.  Follow and adjust.    Recent Labs    08/27/20 2216 08/28/20 0830 08/28/20 1251  GLUCAP 280* 174* 321*    Hypothyroidism: Continue levothyroxine  HTN: BP stable-continue amlodipine, Lopressor and hydralazine.    HLD: Continue statin  Obesity: Estimated body mass index is 35.81 kg/m as calculated from the following:   Height as of this encounter: 5\' 2"  (1.575 m).   Weight as of this encounter: 88.8 kg.   RN pressure injury documentation: Pressure Injury 08/20/20 Coccyx Mid Stage 1 -  Intact skin with non-blanchable redness of a localized  area usually over a bony prominence. (Active)  08/20/20 0800  Location: Coccyx  Location Orientation: Mid  Staging: Stage 1 -  Intact skin with non-blanchable redness of a localized area usually over a bony prominence.  Wound Description (Comments):   Present on Admission: Yes    GI prophylaxis: PPI  ABG:    Component Value Date/Time   PHART 7.266 (L) 08/19/2020 0808   PCO2ART 32.3 08/19/2020 0808   PO2ART 186 (H) 08/19/2020 0808   HCO3 19.0 (L) 08/20/2020 1101   TCO2 16 (L) 08/19/2020 0808   ACIDBASEDEF 6.4 (H) 08/20/2020 1101   O2SAT 85.1 08/20/2020 1101    Vent Settings: N/A FiO2 (%):  [40 %] 40 %  Condition - Extremely Guarded  Family Communication  :  daughter Mendel Ryder [phone] updated on 1/20  Code Status :  Full Code  Diet :  Diet Order            Diet Carb Modified Fluid consistency: Thin; Room service appropriate? Yes  Diet effective now                  Disposition Plan  :   Status is: Inpatient  Remains inpatient appropriate because:Inpatient level of care appropriate due to severity of illness   Dispo: The patient is from: Home              Anticipated d/c is to: SNF              Anticipated d/c date is: > 3 days              Patient currently is not medically stable to d/c.   Barriers to discharge: Hypoxia requiring O2 supplementation  Antimicorbials  :    Anti-infectives (From admission, onward)   Start     Dose/Rate Route Frequency Ordered Stop   08/20/20 1445  cefTRIAXone (ROCEPHIN) 1 g in sodium chloride 0.9 % 100 mL IVPB        1 g 200 mL/hr over 30 Minutes Intravenous Every 24 hours 08/20/20 1348 08/26/20 2147   08/20/20 1000  remdesivir 100 mg in sodium chloride 0.9 % 100 mL IVPB       "Followed by" Linked Group Details   100 mg 200 mL/hr over 30 Minutes Intravenous Daily 08/19/20 0021 08/23/20  1113   08/19/20 0130  remdesivir 200 mg in sodium chloride 0.9% 250 mL IVPB       "Followed by" Linked Group Details   200 mg 580  mL/hr over 30 Minutes Intravenous Once 08/19/20 0021 08/19/20 0314      Inpatient Medications  Scheduled Meds: . amLODipine  10 mg Oral Daily  . vitamin C  500 mg Oral Daily  . aspirin EC  81 mg Oral Daily  . baricitinib  1 mg Oral Daily  . feeding supplement (NEPRO CARB STEADY)  237 mL Oral BID BM  . fluticasone  1 spray Each Nare Daily  . heparin  7,500 Units Subcutaneous Q8H  . hydrALAZINE  25 mg Oral Q8H  . insulin aspart  0-15 Units Subcutaneous TID WC  . insulin aspart  6 Units Subcutaneous TID WC  . insulin detemir  18 Units Subcutaneous BID  . levothyroxine  75 mcg Oral Q0600  . mouth rinse  15 mL Mouth Rinse BID  . methylPREDNISolone (SOLU-MEDROL) injection  60 mg Intravenous Q12H  . metoprolol tartrate  75 mg Oral BID  . pantoprazole  40 mg Oral Daily  . simvastatin  10 mg Oral q1800  . sodium bicarbonate  650 mg Oral TID  . tacrolimus  3 mg Oral QHS  . tacrolimus  4 mg Oral Daily  . zinc sulfate  220 mg Oral Daily   Continuous Infusions: . sodium chloride     PRN Meds:.sodium chloride, acetaminophen **OR** acetaminophen, guaiFENesin, hydrALAZINE, labetalol, loperamide, polyethylene glycol   Time Spent in minutes  35    See all Orders from today for further details   Oren Binet M.D on 08/28/2020 at 3:24 PM  To page go to [email or web address] - use universal password  [facility] -  Office  [phone]    Objective:   Vitals:   08/27/20 2139 08/28/20 0237 08/28/20 0559 08/28/20 0904  BP: (!) 157/71     Pulse: 74     Resp: 20 (!) 22    Temp: 98 F (36.7 C)  (!) 97.5 F (36.4 C)   TempSrc: Oral  Oral   SpO2: 94% 93%  92%  Weight:   88.8 kg   Height:        Wt Readings from Last 3 Encounters:  08/28/20 88.8 kg  05/14/20 92.5 kg  05/14/20 92.9 kg     Intake/Output Summary (Last 24 hours) at 08/28/2020 1524 Last data filed at 08/28/2020 0505 Gross per 24 hour  Intake 240 ml  Output 3 ml  Net 237 ml     Physical Exam Gen Exam:Alert  awake-not in any distress HEENT:atraumatic, normocephalic Chest: B/L clear to auscultation anteriorly CVS:S1S2 regular Abdomen:soft non tender, non distended Extremities:no edema Neurology: Non focal Skin: no rash   Data Review:    CBC Recent Labs  Lab 08/23/20 0609 08/24/20 0246 08/25/20 0117 08/26/20 0111 08/28/20 0551  WBC 5.7 11.3* 8.6 10.1 11.8*  HGB 10.8* 11.4* 11.3* 10.4* 10.8*  HCT 33.2* 33.9* 33.8* 31.0* 33.0*  PLT 145* 144* 152 132* 138*  MCV 75.5* 74.7* 77.2* 74.9* 75.0*  MCH 24.5* 25.1* 25.8* 25.1* 24.5*  MCHC 32.5 33.6 33.4 33.5 32.7  RDW 14.3 14.4 14.5 14.1 14.2  LYMPHSABS 0.3* 0.3* 0.4* 0.4* 0.4*  MONOABS 0.2 0.5 0.4 0.5 0.5  EOSABS 0.0 0.0 0.0 0.0 0.0  BASOSABS 0.0 0.0 0.0 0.0 0.0    Chemistries  Recent Labs  Lab 08/23/20 0314 08/24/20 0246 08/25/20 0117 08/26/20 0111 08/26/20  4481 08/28/20 0551  NA 140 141 136 135  --  138  K 4.8 4.4 4.3 4.1  --  4.1  CL 104 103 99 98  --  101  CO2 21* 26 24 24   --  25  GLUCOSE 177* 64* 316* 298*  --  208*  BUN 83* 75* 69* 68*  --  67*  CREATININE 2.25* 2.08* 1.88* 1.96*  --  1.91*  CALCIUM 9.6 9.9 9.5 9.3  --  9.9  MG  --   --   --   --  2.1  --   AST 27 24 17 16   --  18  ALT 12 16 15 14   --  24  ALKPHOS 67 66 74 66  --  66  BILITOT 0.7 0.9 0.8 0.8  --  0.8   ------------------------------------------------------------------------------------------------------------------ No results for input(s): CHOL, HDL, LDLCALC, TRIG, CHOLHDL, LDLDIRECT in the last 72 hours.  Lab Results  Component Value Date   HGBA1C 8.2 (H) 08/19/2020   ------------------------------------------------------------------------------------------------------------------ No results for input(s): TSH, T4TOTAL, T3FREE, THYROIDAB in the last 72 hours.  Invalid input(s): FREET3 ------------------------------------------------------------------------------------------------------------------ No results for input(s): VITAMINB12,  FOLATE, FERRITIN, TIBC, IRON, RETICCTPCT in the last 72 hours.  Coagulation profile No results for input(s): INR, PROTIME in the last 168 hours.  Recent Labs    08/27/20 0239 08/28/20 0551  DDIMER 1.64* 1.55*    Cardiac Enzymes No results for input(s): CKMB, TROPONINI, MYOGLOBIN in the last 168 hours.  Invalid input(s): CK ------------------------------------------------------------------------------------------------------------------ No results found for: BNP  Micro Results Recent Results (from the past 240 hour(s))  Blood Culture (routine x 2)     Status: None   Collection Time: 08/18/20  8:56 PM   Specimen: BLOOD  Result Value Ref Range Status   Specimen Description BLOOD SITE NOT SPECIFIED  Final   Special Requests   Final    BOTTLES DRAWN AEROBIC ONLY Blood Culture results may not be optimal due to an inadequate volume of blood received in culture bottles   Culture   Final    NO GROWTH 5 DAYS Performed at [facility], [place] [address] [place], [place] [address]    Report Status 08/23/2020 FINAL  Final  Blood Culture (routine x 2)     Status: None   Collection Time: 08/18/20  8:56 PM   Specimen: BLOOD  Result Value Ref Range Status   Specimen Description BLOOD SITE NOT SPECIFIED  Final   Special Requests   Final    BOTTLES DRAWN AEROBIC AND ANAEROBIC Blood Culture results may not be optimal due to an inadequate volume of blood received in culture bottles   Culture   Final    NO GROWTH 5 DAYS Performed at [facility], [place] [address], [place], [place] [address]    Report Status 08/23/2020 FINAL  Final  Culture, Urine     Status: Abnormal   Collection Time: 08/19/20 10:07 AM   Specimen: Urine, Random  Result Value Ref Range Status   Specimen Description URINE, RANDOM  Final   Special Requests   Final    NONE Performed at [facility], [place] [address] [place], [place] [address]    Culture >=100,000 COLONIES/mL KLEBSIELLA PNEUMONIAE (A)  Final    Report Status 08/21/2020 FINAL  Final   Organism ID, Bacteria KLEBSIELLA PNEUMONIAE (A)  Final      Susceptibility   Klebsiella pneumoniae - MIC*    AMPICILLIN >=32 RESISTANT Resistant  CEFAZOLIN <=4 SENSITIVE Sensitive     CEFEPIME <=0.12 SENSITIVE Sensitive     CEFTRIAXONE <=0.25 SENSITIVE Sensitive     CIPROFLOXACIN <=0.25 SENSITIVE Sensitive     GENTAMICIN <=1 SENSITIVE Sensitive     IMIPENEM <=0.25 SENSITIVE Sensitive     NITROFURANTOIN 32 SENSITIVE Sensitive     TRIMETH/SULFA <=20 SENSITIVE Sensitive     AMPICILLIN/SULBACTAM 8 SENSITIVE Sensitive     PIP/TAZO <=4 SENSITIVE Sensitive     * >=100,000 COLONIES/mL KLEBSIELLA PNEUMONIAE  MRSA PCR Screening     Status: None   Collection Time: 08/20/20 12:22 AM   Specimen: Nasopharyngeal  Result Value Ref Range Status   MRSA by PCR NEGATIVE NEGATIVE Final    Comment:        The GeneXpert MRSA Assay (FDA approved for NASAL specimens only), is one component of a comprehensive MRSA colonization surveillance program. It is not intended to diagnose MRSA infection nor to guide or monitor treatment for MRSA infections. Performed at [facility], [place] [address] [place], [place] [address]     Radiology Reports CT CHEST WO CONTRAST  Result Date: 08/24/2020 CLINICAL DATA:  Severe hypoxia and respiratory failure with COVID-19 infection. EXAM: CT CHEST WITHOUT CONTRAST TECHNIQUE: Multidetector CT imaging of the chest was performed following the standard protocol without IV contrast. COMPARISON:  Chest x-ray 08/18/2020 FINDINGS: Cardiovascular: Mild cardiomegaly. Mild calcification over the mitral valve annulus. Thoracic aorta is normal caliber. Stent over the right subclavian/axillary region. Remaining vascular structures are unremarkable. Mediastinum/Nodes: No evidence of mediastinal or hilar adenopathy. Remaining mediastinal structures are unremarkable. Lungs/Pleura: Lungs are adequately inflated demonstrate moderate bilateral  hazy airspace process likely multifocal viral pneumonia in this COVID-19 positive patient. No evidence of effusion. Airways are otherwise unremarkable. Upper Abdomen: No acute findings. Musculoskeletal: Degenerative change of the spine. Mild anterior wedging of a midthoracic vertebral body likely chronic. IMPRESSION: 1. Moderate bilateral hazy airspace process likely multifocal viral pneumonia in this COVID-19 positive patient. 2. Mild cardiomegaly. 3. Mild anterior wedging of a midthoracic vertebral body likely chronic. Electronically Signed   By: Marin Olp M.D.   On: 08/24/2020 12:37   US Renal Transplant w/Doppler  Result Date: 08/19/2020 CLINICAL DATA:  Acute kidney injury EXAM: ULTRASOUND OF RENAL TRANSPLANT WITH RENAL DOPPLER ULTRASOUND TECHNIQUE: Ultrasound examination of the renal transplant was performed with gray-scale, color and duplex doppler evaluation. COMPARISON:  None. FINDINGS: Transplant kidney location: Left lower quadrant Transplant Kidney: Renal measurements: 10.8 x 6.2 x 6.5 cm = volume: 21mL. Normal in size and parenchymal echogenicity. No evidence of mass or hydronephrosis. No peri-transplant fluid collection seen. Color flow in the main renal artery:  Yes Color flow in the main renal vein:  Yes Duplex Doppler Evaluation: Main Renal Artery Velocity:  cm/sec Main Renal Artery Resistive Index: 0.99 Venous waveform in main renal vein:  Present Intrarenal resistive index in upper pole:  0.98 (normal 0.6-0.8; equivocal 0.8-0.9; abnormal >= 0.9) Intrarenal resistive index in lower pole: 0.98 (normal 0.6-0.8; equivocal 0.8-0.9; abnormal >= 0.9) Bladder: Normal for degree of bladder distention. Other findings:  None. IMPRESSION: Elevated resistive indices within the left lower quadrant transplant kidney. No hydronephrosis. Electronically Signed   By: Rolm Baptise M.D.   On: 08/19/2020 19:15   DG Chest Portable 1 View  Result Date: 08/18/2020 CLINICAL DATA:  65 year old female with  history of COVID infection. Chest pain. Shortness of breath. EXAM: PORTABLE CHEST 1 VIEW COMPARISON:  Chest x-ray 10/20/2014. FINDINGS: Patchy multifocal airspace consolidation and areas of  interstitial prominence are noted throughout the lungs bilaterally, most severe throughout the periphery of the mid to lower left lung, indicative of multilobar bilateral pneumonia. No definite pleural effusions. No evidence of pulmonary edema. No pneumothorax. Heart size is borderline enlarged. Upper mediastinal contours are within normal limits. Atherosclerotic calcifications in the thoracic aorta. Vascular stent projecting over the right subclavian region. IMPRESSION: 1. Severe multilobar bilateral (left greater than right) pneumonia compatible with reported COVID infection. 2. Aortic atherosclerosis. Electronically Signed   By: Vinnie Langton M.D.   On: 08/18/2020 18:02

## 2020-08-29 ENCOUNTER — Telehealth: Payer: Self-pay

## 2020-08-29 DIAGNOSIS — I10 Essential (primary) hypertension: Secondary | ICD-10-CM | POA: Diagnosis not present

## 2020-08-29 DIAGNOSIS — E875 Hyperkalemia: Secondary | ICD-10-CM | POA: Diagnosis not present

## 2020-08-29 DIAGNOSIS — U071 COVID-19: Secondary | ICD-10-CM | POA: Diagnosis not present

## 2020-08-29 DIAGNOSIS — N179 Acute kidney failure, unspecified: Secondary | ICD-10-CM | POA: Diagnosis not present

## 2020-08-29 LAB — GLUCOSE, CAPILLARY
Glucose-Capillary: 133 mg/dL — ABNORMAL HIGH (ref 70–99)
Glucose-Capillary: 224 mg/dL — ABNORMAL HIGH (ref 70–99)
Glucose-Capillary: 222 mg/dL — ABNORMAL HIGH (ref 70–99)
Glucose-Capillary: 201 mg/dL — ABNORMAL HIGH (ref 70–99)

## 2020-08-29 LAB — COMPREHENSIVE METABOLIC PANEL
ALT: 60 U/L — ABNORMAL HIGH (ref 0–44)
AST: 30 U/L (ref 15–41)
Albumin: 2.8 g/dL — ABNORMAL LOW (ref 3.5–5.0)
Alkaline Phosphatase: 75 U/L (ref 38–126)
Anion gap: 10 (ref 5–15)
BUN: 75 mg/dL — ABNORMAL HIGH (ref 8–23)
CO2: 24 mmol/L (ref 22–32)
Calcium: 9.8 mg/dL (ref 8.9–10.3)
Chloride: 101 mmol/L (ref 98–111)
Creatinine, Ser: 1.94 mg/dL — ABNORMAL HIGH (ref 0.44–1.00)
GFR, Estimated: 28 mL/min — ABNORMAL LOW (ref >60)
Glucose, Bld: 177 mg/dL — ABNORMAL HIGH (ref 70–99)
Potassium: 4 mmol/L (ref 3.5–5.1)
Sodium: 135 mmol/L (ref 135–145)
Total Bilirubin: 0.8 mg/dL (ref 0.3–1.2)
Total Protein: 6.2 g/dL — ABNORMAL LOW (ref 6.5–8.1)

## 2020-08-29 LAB — CBC WITH DIFFERENTIAL/PLATELET
Abs Immature Granulocytes: 0.94 10*3/uL — ABNORMAL HIGH (ref 0.00–0.07)
Basophils Absolute: 0.1 10*3/uL (ref 0.0–0.1)
Basophils Relative: 0 %
Eosinophils Absolute: 0 10*3/uL (ref 0.0–0.5)
Eosinophils Relative: 0 %
HCT: 31.7 % — ABNORMAL LOW (ref 36.0–46.0)
Hemoglobin: 10.9 g/dL — ABNORMAL LOW (ref 12.0–15.0)
Immature Granulocytes: 6 %
Lymphocytes Relative: 3 %
Lymphs Abs: 0.4 10*3/uL — ABNORMAL LOW (ref 0.7–4.0)
MCH: 25.5 pg — ABNORMAL LOW (ref 26.0–34.0)
MCHC: 34.4 g/dL (ref 30.0–36.0)
MCV: 74.1 fL — ABNORMAL LOW (ref 80.0–100.0)
Monocytes Absolute: 0.8 10*3/uL (ref 0.1–1.0)
Monocytes Relative: 5 %
Neutro Abs: 13.8 10*3/uL — ABNORMAL HIGH (ref 1.7–7.7)
Neutrophils Relative %: 86 %
Platelets: 146 10*3/uL — ABNORMAL LOW (ref 150–400)
RBC: 4.28 MIL/uL (ref 3.87–5.11)
RDW: 14.6 % (ref 11.5–15.5)
WBC: 16 10*3/uL — ABNORMAL HIGH (ref 4.0–10.5)
nRBC: 0.1 % (ref 0.0–0.2)

## 2020-08-29 LAB — TSH: TSH: 0.114 u[IU]/mL — ABNORMAL LOW (ref 0.350–4.500)

## 2020-08-29 LAB — D-DIMER, QUANTITATIVE: D-Dimer, Quant: 1.07 ug/mL-FEU — ABNORMAL HIGH (ref 0.00–0.50)

## 2020-08-29 MED ORDER — METHYLPREDNISOLONE SODIUM SUCC 40 MG IJ SOLR
40.0000 mg | Freq: Two times a day (BID) | INTRAMUSCULAR | Status: DC
  Administered 2020-08-29 – 2020-08-31 (×4): 40 mg via INTRAVENOUS
  Filled 2020-08-29 (×4): qty 1

## 2020-08-29 MED ORDER — LEVOTHYROXINE SODIUM 50 MCG PO TABS
50.0000 ug | ORAL_TABLET | Freq: Every day | ORAL | Status: DC
  Administered 2020-08-30 – 2020-10-13 (×45): 50 ug via ORAL
  Filled 2020-08-29 (×45): qty 1

## 2020-08-29 NOTE — Progress Notes (Signed)
PROGRESS NOTE                                                                                                                                                                                                             Patient Demographics:    [name] [name], is a 65 y.o. female, DOB - 03-May-1956, TDH:[id number]  Outpatient Primary MD for the patient is Glendale Chard, MD   Admit date - 08/18/2020   LOS - 21  Chief Complaint  Patient presents with   Covid Positive   Chest Pain   Weakness       Brief Narrative: Patient is a 65 y.o. female with PMHx of renal transplant x2 (latest in 2016) on chronic immunosuppressive agents, DM-2, hypothyroidism-who presented with shortness of breath-found to have acute hypoxic respiratory failure due to COVID-19 pneumonia.  COVID-19 vaccinated status: Vaccinated including booster  Significant Events: 1/10>> Admit to [facility] for acute hypoxic respiratory failure due to COVID-19 pneumonia 1/11>> transfer to ICU due to worsening hypoxemia, AKI/metabolic acidosis/hyperkalemia. 1/14>> transferred to [facility]  Significant studies: 1/10>> chest x-ray: Severe multilobar bilateral pneumonia. 1/11>> ultrasound renal transplant: No hydronephrosis 1/16>> CT chest: Multifocal viral pneumonia  COVID-19 medications: Steroids: 1/10>> Remdesivir: 1/10>> Baricitinib: 1/16>>  Antibiotics: Rocephin: 1/12>> 1/18  Microbiology data: 1/10 >>blood culture: No growth 1/11>> urine culture: Klebsiella pneumoniae  Procedures: None  Consults: PCCM, nephrology  DVT prophylaxis: heparin injection 7,500 Units Start: 08/25/20 1400 SCDs Start: 08/19/20 0933    Subjective:   Remains unchanged-no major issues overnight.  She still has shortness of breath with minimal ambulation but comfortable at rest.   Assessment  & Plan :   Acute Hypoxic Resp Failure due to Covid 19 Viral pneumonia: Continues  to have severe hypoxemia-remains on heated high flow-slightly decrease steroids today-continue baricitinib.  Volume status remains stable.  Continue supportive care and continued attempts to slowly titrate down FiO2.    Fever: afebrile O2 requirements:  SpO2: 95 % O2 Flow Rate (L/min): 20 L/min FiO2 (%): 40 %   COVID-19 Labs: Recent Labs    08/27/20 0239 08/28/20 0551 08/29/20 0128  DDIMER 1.64* 1.55* 1.07*    No results found for: BNP  Recent Labs  Lab 08/23/20 1039 08/25/20 0117 08/26/20 0111  PROCALCITON 0.26 0.12 <0.10    Lab Results  Component Value Date   SARSCOV2NAA NEGATIVE  04/27/2020   [place] Not Detected 01/30/2019     Prone/Incentive Spirometry: encouraged  incentive spirometry use 3-4/hour.  Sinus pause: Occurred on 1/20-approximately 4.25 seconds-this was while she was sleeping-stopped beta-blocker on 1/20.  Case discussed with cardiology-since this occurred sleeping-and patient was without symptoms-no further work-up required.  Recommendations were for outpatient heart monitoring.  Continue telemetry monitoring.  AKI on CKD stage IV-transplanted kidney: AKI hemodynamically mediated-creatinine improved but seems to have plateaued lately.  Nephrology following and directing care.   History of renal transplant (second renal transplant-01/2015): Remains on Prograf-on steroids-Myfortic on hold.  Nephrology following.    Hyperkalemia/anion gap metabolic acidosis: Secondary to AKI-resolved.  Complicated UTI: Completed a course of Rocephin.  Afebrile and nontoxic-appearing.  DKA: Resolved-no longer on insulin drip.  DM-2 (A1c 8.2 on 1/11) with uncontrolled hyperglycemia due to steroids: CBGs relatively stable-still on the higher side-have started to titrate down steroid dosage-hence we will continue with Levemir 18 units twice daily, 6 units of NovoLog with meals and SSI-and reassess tomorrow.  Recent Labs    08/28/20 2044 08/29/20 0724 08/29/20 1228   GLUCAP 315* 133* 224*    Hypothyroidism: Continue levothyroxine-given suppressed TSH-I have decreased dosage to 50 mcg-repeat TSH in 3 months.  HTN: BP stable-continue amlodipine, Lopressor and hydralazine.    HLD: Continue statin  Obesity: Estimated body mass index is 35.85 kg/m as calculated from the following:   Height as of this encounter: 5\' 2"  (1.575 m).   Weight as of this encounter: 88.9 kg.   RN pressure injury documentation: Pressure Injury 08/20/20 Coccyx Mid Stage 1 -  Intact skin with non-blanchable redness of a localized area usually over a bony prominence. (Active)  08/20/20 0800  Location: Coccyx  Location Orientation: Mid  Staging: Stage 1 -  Intact skin with non-blanchable redness of a localized area usually over a bony prominence.  Wound Description (Comments):   Present on Admission: Yes    GI prophylaxis: PPI  ABG:    Component Value Date/Time   PHART 7.266 (L) 08/19/2020 0808   PCO2ART 32.3 08/19/2020 0808   PO2ART 186 (H) 08/19/2020 0808   HCO3 19.0 (L) 08/20/2020 1101   TCO2 16 (L) 08/19/2020 0808   ACIDBASEDEF 6.4 (H) 08/20/2020 1101   O2SAT 85.1 08/20/2020 1101    Vent Settings: N/A FiO2 (%):  [40 %] 40 %  Condition - Extremely Guarded  Family Communication  :  daughter Mendel Ryder [phone] updated on 1/21  Code Status :  Full Code  Diet :  Diet Order            Diet Carb Modified Fluid consistency: Thin; Room service appropriate? Yes  Diet effective now                  Disposition Plan  :   Status is: Inpatient  Remains inpatient appropriate because:Inpatient level of care appropriate due to severity of illness   Dispo: The patient is from: Home              Anticipated d/c is to: SNF              Anticipated d/c date is: > 3 days              Patient currently is not medically stable to d/c.   Barriers to discharge: Hypoxia requiring O2 supplementation  Antimicorbials  :    Anti-infectives (From admission,  onward)   Start     Dose/Rate Route Frequency Ordered Stop  08/20/20 1445  cefTRIAXone (ROCEPHIN) 1 g in sodium chloride 0.9 % 100 mL IVPB        1 g 200 mL/hr over 30 Minutes Intravenous Every 24 hours 08/20/20 1348 08/26/20 2147   08/20/20 1000  remdesivir 100 mg in sodium chloride 0.9 % 100 mL IVPB       "Followed by" Linked Group Details   100 mg 200 mL/hr over 30 Minutes Intravenous Daily 08/19/20 0021 08/23/20 1113   08/19/20 0130  remdesivir 200 mg in sodium chloride 0.9% 250 mL IVPB       "Followed by" Linked Group Details   200 mg 580 mL/hr over 30 Minutes Intravenous Once 08/19/20 0021 08/19/20 0314      Inpatient Medications  Scheduled Meds:  amLODipine  10 mg Oral Daily   vitamin C  500 mg Oral Daily   aspirin EC  81 mg Oral Daily   baricitinib  1 mg Oral Daily   feeding supplement (NEPRO CARB STEADY)  237 mL Oral BID BM   fluticasone  1 spray Each Nare Daily   heparin  7,500 Units Subcutaneous Q8H   hydrALAZINE  25 mg Oral Q8H   insulin aspart  0-15 Units Subcutaneous TID WC   insulin aspart  6 Units Subcutaneous TID WC   insulin detemir  18 Units Subcutaneous BID   [START ON 08/30/2020] levothyroxine  50 mcg Oral Q0600   mouth rinse  15 mL Mouth Rinse BID   methylPREDNISolone (SOLU-MEDROL) injection  60 mg Intravenous Q12H   pantoprazole  40 mg Oral Daily   simvastatin  10 mg Oral q1800   sodium bicarbonate  650 mg Oral TID   tacrolimus  3 mg Oral QHS   tacrolimus  4 mg Oral Daily   zinc sulfate  220 mg Oral Daily   Continuous Infusions:  sodium chloride     PRN Meds:.sodium chloride, acetaminophen **OR** acetaminophen, guaiFENesin, hydrALAZINE, labetalol, loperamide, polyethylene glycol   Time Spent in minutes  35    See all Orders from today for further details   Oren Binet M.D on 08/29/2020 at 3:42 PM  To page go to [email or web address] - use universal password  [facility] -  Office  [phone]    Objective:    Vitals:   08/29/20 0549 08/29/20 0748 08/29/20 1222 08/29/20 1237  BP:   (!) 142/73 (!) 142/73  Pulse:   85 85  Resp:   (!) 21 (!) 24  Temp:   97.6 F (36.4 C)   TempSrc:   Oral   SpO2:  97% 96% 95%  Weight: 88.9 kg     Height:        Wt Readings from Last 3 Encounters:  08/29/20 88.9 kg  05/14/20 92.5 kg  05/14/20 92.9 kg     Intake/Output Summary (Last 24 hours) at 08/29/2020 1542 Last data filed at 08/29/2020 0906 Gross per 24 hour  Intake 370 ml  Output --  Net 370 ml     Physical Exam Gen Exam:Alert awake-not in any distress HEENT:atraumatic, normocephalic Chest: B/L clear to auscultation anteriorly CVS:S1S2 regular Abdomen:soft non tender, non distended Extremities:no edema Neurology: Non focal Skin: no rash   Data Review:    CBC Recent Labs  Lab 08/24/20 0246 08/25/20 0117 08/26/20 0111 08/28/20 0551 08/29/20 0128  WBC 11.3* 8.6 10.1 11.8* 16.0*  HGB 11.4* 11.3* 10.4* 10.8* 10.9*  HCT 33.9* 33.8* 31.0* 33.0* 31.7*  PLT 144* 152 132* 138* 146*  MCV 74.7* 77.2* 74.9* 75.0*  74.1*  MCH 25.1* 25.8* 25.1* 24.5* 25.5*  MCHC 33.6 33.4 33.5 32.7 34.4  RDW 14.4 14.5 14.1 14.2 14.6  LYMPHSABS 0.3* 0.4* 0.4* 0.4* 0.4*  MONOABS 0.5 0.4 0.5 0.5 0.8  EOSABS 0.0 0.0 0.0 0.0 0.0  BASOSABS 0.0 0.0 0.0 0.0 0.1    Chemistries  Recent Labs  Lab 08/24/20 0246 08/25/20 0117 08/26/20 0111 08/26/20 0526 08/28/20 0551 08/29/20 0128  NA 141 136 135  --  138 135  K 4.4 4.3 4.1  --  4.1 4.0  CL 103 99 98  --  101 101  CO2 26 24 24   --  25 24  GLUCOSE 64* 316* 298*  --  208* 177*  BUN 75* 69* 68*  --  67* 75*  CREATININE 2.08* 1.88* 1.96*  --  1.91* 1.94*  CALCIUM 9.9 9.5 9.3  --  9.9 9.8  MG  --   --   --  2.1  --   --   AST 24 17 16   --  18 30  ALT 16 15 14   --  24 60*  ALKPHOS 66 74 66  --  66 75  BILITOT 0.9 0.8 0.8  --  0.8 0.8   ------------------------------------------------------------------------------------------------------------------ No  results for input(s): CHOL, HDL, LDLCALC, TRIG, CHOLHDL, LDLDIRECT in the last 72 hours.  Lab Results  Component Value Date   HGBA1C 8.2 (H) 08/19/2020   ------------------------------------------------------------------------------------------------------------------ Recent Labs    08/29/20 0128  TSH 0.114*   ------------------------------------------------------------------------------------------------------------------ No results for input(s): VITAMINB12, FOLATE, FERRITIN, TIBC, IRON, RETICCTPCT in the last 72 hours.  Coagulation profile No results for input(s): INR, PROTIME in the last 168 hours.  Recent Labs    08/28/20 0551 08/29/20 0128  DDIMER 1.55* 1.07*    Cardiac Enzymes No results for input(s): CKMB, TROPONINI, MYOGLOBIN in the last 168 hours.  Invalid input(s): CK ------------------------------------------------------------------------------------------------------------------ No results found for: BNP  Micro Results Recent Results (from the past 240 hour(s))  MRSA PCR Screening     Status: None   Collection Time: 08/20/20 12:22 AM   Specimen: Nasopharyngeal  Result Value Ref Range Status   MRSA by PCR NEGATIVE NEGATIVE Final    Comment:        The GeneXpert MRSA Assay (FDA approved for NASAL specimens only), is one component of a comprehensive MRSA colonization surveillance program. It is not intended to diagnose MRSA infection nor to guide or monitor treatment for MRSA infections. Performed at [facility], [place] [address] [place], [place] [address]     Radiology Reports CT CHEST WO CONTRAST  Result Date: 08/24/2020 CLINICAL DATA:  Severe hypoxia and respiratory failure with COVID-19 infection. EXAM: CT CHEST WITHOUT CONTRAST TECHNIQUE: Multidetector CT imaging of the chest was performed following the standard protocol without IV contrast. COMPARISON:  Chest x-ray 08/18/2020 FINDINGS: Cardiovascular: Mild cardiomegaly. Mild calcification  over the mitral valve annulus. Thoracic aorta is normal caliber. Stent over the right subclavian/axillary region. Remaining vascular structures are unremarkable. Mediastinum/Nodes: No evidence of mediastinal or hilar adenopathy. Remaining mediastinal structures are unremarkable. Lungs/Pleura: Lungs are adequately inflated demonstrate moderate bilateral hazy airspace process likely multifocal viral pneumonia in this COVID-19 positive patient. No evidence of effusion. Airways are otherwise unremarkable. Upper Abdomen: No acute findings. Musculoskeletal: Degenerative change of the spine. Mild anterior wedging of a midthoracic vertebral body likely chronic. IMPRESSION: 1. Moderate bilateral hazy airspace process likely multifocal viral pneumonia in this COVID-19 positive patient. 2. Mild cardiomegaly. 3. Mild anterior wedging of a midthoracic  vertebral body likely chronic. Electronically Signed   By: Marin Olp M.D.   On: 08/24/2020 12:37   US Renal Transplant w/Doppler  Result Date: 08/19/2020 CLINICAL DATA:  Acute kidney injury EXAM: ULTRASOUND OF RENAL TRANSPLANT WITH RENAL DOPPLER ULTRASOUND TECHNIQUE: Ultrasound examination of the renal transplant was performed with gray-scale, color and duplex doppler evaluation. COMPARISON:  None. FINDINGS: Transplant kidney location: Left lower quadrant Transplant Kidney: Renal measurements: 10.8 x 6.2 x 6.5 cm = volume: 220mL. Normal in size and parenchymal echogenicity. No evidence of mass or hydronephrosis. No peri-transplant fluid collection seen. Color flow in the main renal artery:  Yes Color flow in the main renal vein:  Yes Duplex Doppler Evaluation: Main Renal Artery Velocity:  cm/sec Main Renal Artery Resistive Index: 0.99 Venous waveform in main renal vein:  Present Intrarenal resistive index in upper pole:  0.98 (normal 0.6-0.8; equivocal 0.8-0.9; abnormal >= 0.9) Intrarenal resistive index in lower pole: 0.98 (normal 0.6-0.8; equivocal 0.8-0.9; abnormal >=  0.9) Bladder: Normal for degree of bladder distention. Other findings:  None. IMPRESSION: Elevated resistive indices within the left lower quadrant transplant kidney. No hydronephrosis. Electronically Signed   By: Rolm Baptise M.D.   On: 08/19/2020 19:15   DG Chest Portable 1 View  Result Date: 08/18/2020 CLINICAL DATA:  65 year old female with history of COVID infection. Chest pain. Shortness of breath. EXAM: PORTABLE CHEST 1 VIEW COMPARISON:  Chest x-ray 10/20/2014. FINDINGS: Patchy multifocal airspace consolidation and areas of interstitial prominence are noted throughout the lungs bilaterally, most severe throughout the periphery of the mid to lower left lung, indicative of multilobar bilateral pneumonia. No definite pleural effusions. No evidence of pulmonary edema. No pneumothorax. Heart size is borderline enlarged. Upper mediastinal contours are within normal limits. Atherosclerotic calcifications in the thoracic aorta. Vascular stent projecting over the right subclavian region. IMPRESSION: 1. Severe multilobar bilateral (left greater than right) pneumonia compatible with reported COVID infection. 2. Aortic atherosclerosis. Electronically Signed   By: Vinnie Langton M.D.   On: 08/18/2020 18:02

## 2020-08-29 NOTE — Chronic Care Management (AMB) (Signed)
Chronic Care Management Pharmacy Assistant   Name: [name] [name]  MRN: [id number] DOB: 04-01-1956  Reason for Encounter: Patient Assistance Coordination  08/29/20- Per ED Visit; patient is currently admitted since 08/18/20.  PCP : Glendale Chard, MD  Allergies:   Allergies  Allergen Reactions   Codeine Hives   Acetaminophen-Codeine Rash    Medications: Facility-Administered Encounter Medications as of 08/29/2020  Medication   0.9 %  sodium chloride infusion   acetaminophen (TYLENOL) tablet 650 mg   Or   acetaminophen (TYLENOL) suppository 650 mg   amLODipine (NORVASC) tablet 10 mg   ascorbic acid (VITAMIN C) tablet 500 mg   aspirin EC tablet 81 mg   baricitinib (OLUMIANT) tablet 1 mg   feeding supplement (NEPRO CARB STEADY) liquid 237 mL   fluticasone (FLONASE) 50 MCG/ACT nasal spray 1 spray   guaiFENesin (ROBITUSSIN) 100 MG/5ML solution 100 mg   heparin injection 7,500 Units   hydrALAZINE (APRESOLINE) injection 10 mg   hydrALAZINE (APRESOLINE) tablet 25 mg   insulin aspart (novoLOG) injection 0-15 Units   insulin aspart (novoLOG) injection 6 Units   insulin detemir (LEVEMIR) injection 18 Units   labetalol (NORMODYNE) injection 10 mg   [START ON 08/30/2020] levothyroxine (SYNTHROID) tablet 50 mcg   loperamide (IMODIUM) capsule 2 mg   MEDLINE mouth rinse   methylPREDNISolone sodium succinate (SOLU-MEDROL) 125 mg/2 mL injection 60 mg   pantoprazole (PROTONIX) EC tablet 40 mg   polyethylene glycol (MIRALAX / GLYCOLAX) packet 17 g   simvastatin (ZOCOR) tablet 10 mg   sodium bicarbonate tablet 650 mg   tacrolimus (PROGRAF) capsule 3 mg   tacrolimus (PROGRAF) capsule 4 mg   zinc sulfate capsule 220 mg   Outpatient Encounter Medications as of 08/29/2020  Medication Sig Note   Alcohol Swabs (ALCOHOL PREP) PADS Use as directed 5 times per day with insulin pen    amLODipine (NORVASC) 5 MG tablet Take 5 mg by mouth daily.      aspirin EC 81 MG tablet Take 81 mg by mouth daily.    Blood Glucose Monitoring Suppl (ACCU-CHEK GUIDE ME) w/Device KIT USE AS DIRECTED ONCE A DAY    Blood Glucose Monitoring Suppl (ACCU-CHEK GUIDE) w/Device KIT USE AS DIRECTED ONCE A DAY    cinacalcet (SENSIPAR) 30 MG tablet Take 30 mg by mouth daily.    diazepam (VALIUM) 2 MG tablet Take 1 tablet (2 mg total) by mouth every 12 (twelve) hours as needed for muscle spasms. 07/09/2020: Not taking as often    glucose blood test strip USE TO TEST BLOOD SUGAR 4 TIMES DAILY AS DIRECTED    GVOKE HYPOPEN 2-PACK 1 MG/0.2ML SOAJ Use in case of low blood sugar    Insulin Aspart (NOVOLOG FLEXPEN [place]) Inject 4 Units into the skin 3 (three) times daily. Sliding scale 11/16/2017: Pt reports 4 units, 3 times daily    insulin detemir (LEVEMIR) 100 UNIT/ML FlexPen Inject 30-35 Units into the skin See admin instructions. 30 units in the morning and 35 units in the evening    levothyroxine (SYNTHROID) 75 MCG tablet Take 75 mcg by mouth daily before breakfast.    lisinopril (PRINIVIL,ZESTRIL) 10 MG tablet Take 10 mg by mouth daily.    loperamide (IMODIUM) 2 MG capsule Take by mouth as needed for diarrhea or loose stools. One by mouth four times daily.    metoprolol tartrate (LOPRESSOR) 25 MG tablet Take 75 mg by mouth 2 (two) times daily. 3 tabs 2 times per day  mometasone (NASONEX) 50 MCG/ACT nasal spray Place 2 sprays into the nose daily.    mycophenolate (MYFORTIC) 180 MG EC tablet Take 360 mg by mouth 2 (two) times daily.     omeprazole (PRILOSEC) 20 MG capsule Take 20 mg by mouth daily.    predniSONE (DELTASONE) 5 MG tablet Take 5 mg by mouth daily with breakfast.    simvastatin (ZOCOR) 10 MG tablet TAKE 1 TABLET BY MOUTH EVERY DAY IN THE EVENING (Patient taking differently: Take 10 mg by mouth daily at 6 PM.)    tacrolimus (PROGRAF) 1 MG capsule 7 capsules in the morning, 6 capsules in the evening 11/16/2017: Pt reports taking 7 in the morning and  6 at night.     Current Diagnosis: Patient Active Problem List   Diagnosis Date Noted   Pressure injury of skin 08/22/2020   Acute hyperkalemia    Diabetic ketoacidosis without coma associated with type 1 diabetes mellitus ([place])    Acute hypoxemic respiratory failure due to COVID-19 ([place]) 08/19/2020   Respiratory failure ([place]) 08/19/2020   ARF (acute renal failure) ([place]) 08/18/2020   Other vitreous opacities, right eye 05/08/2020   Posterior vitreous detachment of left eye 05/08/2020   Posterior vitreous detachment of right eye 05/08/2020   Pseudophakia, both eyes 05/08/2020   DM (diabetes mellitus), type 2 with renal complications ([place]) 65/53/7482   Norovirus 07/17/2015   Hypercalcemia associated with chronic dialysis 05/06/2015   Hypercholesteremia 05/06/2015   Obesity, morbid, BMI 40.0-49.9 ([place]) 05/06/2015   Secondary renal hyperparathyroidism ([place]) 05/06/2015   Essential hypertension with goal blood pressure less than 140/90 04/24/2015   Immunosuppression ([place]) 04/24/2015   Immunosuppressive management encounter following kidney transplant 01/28/2012   CRF (chronic renal failure) 12/08/2011   AKI (acute kidney injury) ([place]) 12/08/2011   Renal transplant recipient 12/08/2011   Immunocompromised ([place]) 12/08/2011     Follow-Up:  Patient Assistance Coordination-Notified Orlando Penner, CPP.  Raynelle Highland, [place] Pharmacist Assistant [phone]

## 2020-08-29 NOTE — Progress Notes (Signed)
Results for DELITA, CHIQUITO WHITE (MRN [id number]) as of 08/29/2020 13:31  Ref. Range 08/28/2020 18:34 08/28/2020 20:44 08/29/2020 07:24 08/29/2020 12:28  Glucose-Capillary Latest Ref Range: 70 - 99 mg/dL 384 (H) 315 (H) 133 (H) 224 (H)  Noted that postprandial blood sugars are still greater than 180 mg/dl.  Recommend increasing Novolog meal coverage to 8 units TID. May need to increase Levemir to 20 units BID if blood sugars continue to be elevated.   Harvel Ricks RN BSN CDE Diabetes Coordinator Pager: [phone]  8am-5pm

## 2020-08-30 DIAGNOSIS — N179 Acute kidney failure, unspecified: Secondary | ICD-10-CM | POA: Diagnosis not present

## 2020-08-30 DIAGNOSIS — E875 Hyperkalemia: Secondary | ICD-10-CM | POA: Diagnosis not present

## 2020-08-30 DIAGNOSIS — I10 Essential (primary) hypertension: Secondary | ICD-10-CM | POA: Diagnosis not present

## 2020-08-30 DIAGNOSIS — U071 COVID-19: Secondary | ICD-10-CM | POA: Diagnosis not present

## 2020-08-30 LAB — GLUCOSE, CAPILLARY
Glucose-Capillary: 153 mg/dL — ABNORMAL HIGH (ref 70–99)
Glucose-Capillary: 193 mg/dL — ABNORMAL HIGH (ref 70–99)
Glucose-Capillary: 207 mg/dL — ABNORMAL HIGH (ref 70–99)
Glucose-Capillary: 224 mg/dL — ABNORMAL HIGH (ref 70–99)

## 2020-08-30 LAB — COMPREHENSIVE METABOLIC PANEL
ALT: 90 U/L — ABNORMAL HIGH (ref 0–44)
AST: 35 U/L (ref 15–41)
Albumin: 2.9 g/dL — ABNORMAL LOW (ref 3.5–5.0)
Alkaline Phosphatase: 82 U/L (ref 38–126)
Anion gap: 12 (ref 5–15)
BUN: 71 mg/dL — ABNORMAL HIGH (ref 8–23)
CO2: 25 mmol/L (ref 22–32)
Calcium: 9.5 mg/dL (ref 8.9–10.3)
Chloride: 104 mmol/L (ref 98–111)
Creatinine, Ser: 1.96 mg/dL — ABNORMAL HIGH (ref 0.44–1.00)
GFR, Estimated: 28 mL/min — ABNORMAL LOW (ref >60)
Glucose, Bld: 144 mg/dL — ABNORMAL HIGH (ref 70–99)
Potassium: 4.7 mmol/L (ref 3.5–5.1)
Sodium: 141 mmol/L (ref 135–145)
Total Bilirubin: 0.7 mg/dL (ref 0.3–1.2)
Total Protein: 6.1 g/dL — ABNORMAL LOW (ref 6.5–8.1)

## 2020-08-30 LAB — C-REACTIVE PROTEIN: CRP: 0.5 mg/dL (ref <1.0)

## 2020-08-30 LAB — CBC
HCT: 34.2 % — ABNORMAL LOW (ref 36.0–46.0)
Hemoglobin: 11.2 g/dL — ABNORMAL LOW (ref 12.0–15.0)
MCH: 25 pg — ABNORMAL LOW (ref 26.0–34.0)
MCHC: 32.7 g/dL (ref 30.0–36.0)
MCV: 76.3 fL — ABNORMAL LOW (ref 80.0–100.0)
Platelets: 126 10*3/uL — ABNORMAL LOW (ref 150–400)
RBC: 4.48 MIL/uL (ref 3.87–5.11)
RDW: 15 % (ref 11.5–15.5)
WBC: 15.5 10*3/uL — ABNORMAL HIGH (ref 4.0–10.5)
nRBC: 0.1 % (ref 0.0–0.2)

## 2020-08-30 LAB — D-DIMER, QUANTITATIVE: D-Dimer, Quant: 1.27 ug/mL-FEU — ABNORMAL HIGH (ref 0.00–0.50)

## 2020-08-30 NOTE — Progress Notes (Signed)
PROGRESS NOTE                                                                                                                                                                                                             Patient Demographics:    [name] [name], is a 65 y.o. female, DOB - 1955-10-17, HER:[id number]  Outpatient Primary MD for the patient is Glendale Chard, MD   Admit date - 08/18/2020   LOS - 22  Chief Complaint  Patient presents with  . Covid Positive  . Chest Pain  . Weakness       Brief Narrative: Patient is a 65 y.o. female with PMHx of renal transplant x2 (latest in 2016) on chronic immunosuppressive agents, DM-2, hypothyroidism-who presented with shortness of breath-found to have acute hypoxic respiratory failure due to COVID-19 pneumonia.  COVID-19 vaccinated status: Vaccinated including booster  Significant Events: 1/10>> Admit to [facility] for acute hypoxic respiratory failure due to COVID-19 pneumonia 1/11>> transfer to ICU due to worsening hypoxemia, AKI/metabolic acidosis/hyperkalemia. 1/14>> transferred to [facility]  Significant studies: 1/10>> chest x-ray: Severe multilobar bilateral pneumonia. 1/11>> ultrasound renal transplant: No hydronephrosis 1/16>> CT chest: Multifocal viral pneumonia  COVID-19 medications: Steroids: 1/10>> Remdesivir: 1/10>>1/15 Baricitinib: 1/16>>  Antibiotics: Rocephin: 1/12>> 1/18  Microbiology data: 1/10 >>blood culture: No growth 1/11>> urine culture: Klebsiella pneumoniae  Procedures: None  Consults: PCCM, nephrology  DVT prophylaxis: heparin injection 7,500 Units Start: 08/25/20 1400 SCDs Start: 08/19/20 0933    Subjective:   Significantly improved-down to 6 L of salter high flow.   Assessment  & Plan :   Acute Hypoxic Resp Failure due to Covid 19 Viral pneumonia: Had severe hypoxemia on initial presentation-was on heated high flow but has had  remarkable improvement over the past few days-Down to 6 L of salter high flow today.  Continue to taper steroids slowly-remains on baricitinib.  Volume status stable-does not require diuretics.  Continue to attempt to slowly titrate down FiO2.    Fever: afebrile O2 requirements:  SpO2: 96 % O2 Flow Rate (L/min): 6 L/min FiO2 (%): 40 %   COVID-19 Labs: Recent Labs    08/28/20 0551 08/29/20 0128 08/30/20 0502  DDIMER 1.55* 1.07* 1.27*  CRP  --   --  0.5    No results found for: BNP  Recent Labs  Lab 08/25/20 0117 08/26/20  0111  PROCALCITON 0.12 <0.10    Lab Results  Component Value Date   SARSCOV2NAA NEGATIVE 04/27/2020   [place] Not Detected 01/30/2019     Prone/Incentive Spirometry: encouraged  incentive spirometry use 3-4/hour.  Sinus pause: Occurred on 1/20-approximately 4.25 seconds-this was while she was sleeping-stopped beta-blocker on 1/20.  Case discussed with cardiology-since this occurred sleeping-and patient was without symptoms-no further work-up required.  Recommendations were for outpatient heart monitoring.  Continue telemetry monitoring.  AKI on CKD stage IV-transplanted kidney: AKI hemodynamically mediated-creatinine improved but seems to have plateaued lately.  Nephrology following and directing care.   History of renal transplant (second renal transplant-01/2015): Remains on Prograf-on steroids-Myfortic on hold.  Nephrology has signed off.  Hyperkalemia/anion gap metabolic acidosis: Secondary to AKI-has resolved.  Complicated UTI: Completed a course of Rocephin.  Afebrile and nontoxic-appearing.  DKA: Resolved-no longer on insulin drip.  DM-2 (A1c 8.2 on 1/11) with uncontrolled hyperglycemia due to steroids: CBG stable-continue Levemir 18 units twice daily, 6 units of NovoLog with meals and SSI.  Follow and adjust.    Recent Labs    08/29/20 2022 08/30/20 0805 08/30/20 1138  GLUCAP 201* 153* 193*    Hypothyroidism: Continue  levothyroxine-given suppressed TSH-I have decreased dosage to 50 mcg-repeat TSH in 3 months.  HTN: BP stable-continue amlodipine, Lopressor and hydralazine.    HLD: Continue statin  Obesity: Estimated body mass index is 34.64 kg/m as calculated from the following:   Height as of this encounter: 5\' 2"  (1.575 m).   Weight as of this encounter: 85.9 kg.   RN pressure injury documentation: Pressure Injury 08/20/20 Coccyx Mid Stage 1 -  Intact skin with non-blanchable redness of a localized area usually over a bony prominence. (Active)  08/20/20 0800  Location: Coccyx  Location Orientation: Mid  Staging: Stage 1 -  Intact skin with non-blanchable redness of a localized area usually over a bony prominence.  Wound Description (Comments):   Present on Admission: Yes    GI prophylaxis: PPI  ABG:    Component Value Date/Time   PHART 7.266 (L) 08/19/2020 0808   PCO2ART 32.3 08/19/2020 0808   PO2ART 186 (H) 08/19/2020 0808   HCO3 19.0 (L) 08/20/2020 1101   TCO2 16 (L) 08/19/2020 0808   ACIDBASEDEF 6.4 (H) 08/20/2020 1101   O2SAT 85.1 08/20/2020 1101    Vent Settings: N/A FiO2 (%):  [40 %] 40 %  Condition - Extremely Guarded  Family Communication  :  daughter Mendel Ryder [phone] updated on 1/21  Code Status :  Full Code  Diet :  Diet Order            Diet Carb Modified Fluid consistency: Thin; Room service appropriate? Yes  Diet effective now                  Disposition Plan  :   Status is: Inpatient  Remains inpatient appropriate because:Inpatient level of care appropriate due to severity of illness   Dispo: The patient is from: Home              Anticipated d/c is to: SNF              Anticipated d/c date is: > 3 days              Patient currently is not medically stable to d/c.   Barriers to discharge: Hypoxia requiring O2 supplementation  Antimicorbials  :    Anti-infectives (From admission, onward)   Start  Dose/Rate Route Frequency Ordered Stop    08/20/20 1445  cefTRIAXone (ROCEPHIN) 1 g in sodium chloride 0.9 % 100 mL IVPB        1 g 200 mL/hr over 30 Minutes Intravenous Every 24 hours 08/20/20 1348 08/26/20 2147   08/20/20 1000  remdesivir 100 mg in sodium chloride 0.9 % 100 mL IVPB       "Followed by" Linked Group Details   100 mg 200 mL/hr over 30 Minutes Intravenous Daily 08/19/20 0021 08/23/20 1113   08/19/20 0130  remdesivir 200 mg in sodium chloride 0.9% 250 mL IVPB       "Followed by" Linked Group Details   200 mg 580 mL/hr over 30 Minutes Intravenous Once 08/19/20 0021 08/19/20 0314      Inpatient Medications  Scheduled Meds: . amLODipine  10 mg Oral Daily  . vitamin C  500 mg Oral Daily  . aspirin EC  81 mg Oral Daily  . baricitinib  1 mg Oral Daily  . feeding supplement (NEPRO CARB STEADY)  237 mL Oral BID BM  . fluticasone  1 spray Each Nare Daily  . heparin  7,500 Units Subcutaneous Q8H  . hydrALAZINE  25 mg Oral Q8H  . insulin aspart  0-15 Units Subcutaneous TID WC  . insulin aspart  6 Units Subcutaneous TID WC  . insulin detemir  18 Units Subcutaneous BID  . levothyroxine  50 mcg Oral Q0600  . mouth rinse  15 mL Mouth Rinse BID  . methylPREDNISolone (SOLU-MEDROL) injection  40 mg Intravenous Q12H  . pantoprazole  40 mg Oral Daily  . simvastatin  10 mg Oral q1800  . sodium bicarbonate  650 mg Oral TID  . tacrolimus  3 mg Oral QHS  . tacrolimus  4 mg Oral Daily  . zinc sulfate  220 mg Oral Daily   Continuous Infusions: . sodium chloride     PRN Meds:.sodium chloride, acetaminophen **OR** acetaminophen, guaiFENesin, hydrALAZINE, labetalol, loperamide, polyethylene glycol   Time Spent in minutes  25    See all Orders from today for further details   Oren Binet M.D on 08/30/2020 at 12:25 PM  To page go to [email or web address] - use universal password  [facility] -  Office  [phone]    Objective:   Vitals:   08/30/20 0412 08/30/20 0800 08/30/20 1045 08/30/20 1135  BP: (!)  149/68   (!) 157/68  Pulse: 96  88 91  Resp: 20  (!) 30 20  Temp: 97.8 F (36.6 C)   97.8 F (36.6 C)  TempSrc: Axillary   Oral  SpO2: 96% 99% 97% 96%  Weight: 85.9 kg     Height:        Wt Readings from Last 3 Encounters:  08/30/20 85.9 kg  05/14/20 92.5 kg  05/14/20 92.9 kg     Intake/Output Summary (Last 24 hours) at 08/30/2020 1225 Last data filed at 08/29/2020 1700 Gross per 24 hour  Intake 120 ml  Output --  Net 120 ml     Physical Exam Gen Exam:Alert awake-not in any distress HEENT:atraumatic, normocephalic Chest: B/L clear to auscultation anteriorly CVS:S1S2 regular Abdomen:soft non tender, non distended Extremities:no edema Neurology: Non focal Skin: no rash   Data Review:    CBC Recent Labs  Lab 08/24/20 0246 08/25/20 0117 08/26/20 0111 08/28/20 0551 08/29/20 0128 08/30/20 0502  WBC 11.3* 8.6 10.1 11.8* 16.0* 15.5*  HGB 11.4* 11.3* 10.4* 10.8* 10.9* 11.2*  HCT 33.9* 33.8* 31.0* 33.0* 31.7* 34.2*  PLT 144* 152 132* 138* 146* 126*  MCV 74.7* 77.2* 74.9* 75.0* 74.1* 76.3*  MCH 25.1* 25.8* 25.1* 24.5* 25.5* 25.0*  MCHC 33.6 33.4 33.5 32.7 34.4 32.7  RDW 14.4 14.5 14.1 14.2 14.6 15.0  LYMPHSABS 0.3* 0.4* 0.4* 0.4* 0.4*  --   MONOABS 0.5 0.4 0.5 0.5 0.8  --   EOSABS 0.0 0.0 0.0 0.0 0.0  --   BASOSABS 0.0 0.0 0.0 0.0 0.1  --     Chemistries  Recent Labs  Lab 08/25/20 0117 08/26/20 0111 08/26/20 0526 08/28/20 0551 08/29/20 0128 08/30/20 0502  NA 136 135  --  138 135 141  K 4.3 4.1  --  4.1 4.0 4.7  CL 99 98  --  101 101 104  CO2 24 24  --  25 24 25   GLUCOSE 316* 298*  --  208* 177* 144*  BUN 69* 68*  --  67* 75* 71*  CREATININE 1.88* 1.96*  --  1.91* 1.94* 1.96*  CALCIUM 9.5 9.3  --  9.9 9.8 9.5  MG  --   --  2.1  --   --   --   AST 17 16  --  18 30 35  ALT 15 14  --  24 60* 90*  ALKPHOS 74 66  --  66 75 82  BILITOT 0.8 0.8  --  0.8 0.8 0.7    ------------------------------------------------------------------------------------------------------------------ No results for input(s): CHOL, HDL, LDLCALC, TRIG, CHOLHDL, LDLDIRECT in the last 72 hours.  Lab Results  Component Value Date   HGBA1C 8.2 (H) 08/19/2020   ------------------------------------------------------------------------------------------------------------------ Recent Labs    08/29/20 0128  TSH 0.114*   ------------------------------------------------------------------------------------------------------------------ No results for input(s): VITAMINB12, FOLATE, FERRITIN, TIBC, IRON, RETICCTPCT in the last 72 hours.  Coagulation profile No results for input(s): INR, PROTIME in the last 168 hours.  Recent Labs    08/29/20 0128 08/30/20 0502  DDIMER 1.07* 1.27*    Cardiac Enzymes No results for input(s): CKMB, TROPONINI, MYOGLOBIN in the last 168 hours.  Invalid input(s): CK ------------------------------------------------------------------------------------------------------------------ No results found for: BNP  Micro Results No results found for this or any previous visit (from the past 240 hour(s)).  Radiology Reports CT CHEST WO CONTRAST  Result Date: 08/24/2020 CLINICAL DATA:  Severe hypoxia and respiratory failure with COVID-19 infection. EXAM: CT CHEST WITHOUT CONTRAST TECHNIQUE: Multidetector CT imaging of the chest was performed following the standard protocol without IV contrast. COMPARISON:  Chest x-ray 08/18/2020 FINDINGS: Cardiovascular: Mild cardiomegaly. Mild calcification over the mitral valve annulus. Thoracic aorta is normal caliber. Stent over the right subclavian/axillary region. Remaining vascular structures are unremarkable. Mediastinum/Nodes: No evidence of mediastinal or hilar adenopathy. Remaining mediastinal structures are unremarkable. Lungs/Pleura: Lungs are adequately inflated demonstrate moderate bilateral hazy airspace  process likely multifocal viral pneumonia in this COVID-19 positive patient. No evidence of effusion. Airways are otherwise unremarkable. Upper Abdomen: No acute findings. Musculoskeletal: Degenerative change of the spine. Mild anterior wedging of a midthoracic vertebral body likely chronic. IMPRESSION: 1. Moderate bilateral hazy airspace process likely multifocal viral pneumonia in this COVID-19 positive patient. 2. Mild cardiomegaly. 3. Mild anterior wedging of a midthoracic vertebral body likely chronic. Electronically Signed   By: Marin Olp M.D.   On: 08/24/2020 12:37   US Renal Transplant w/Doppler  Result Date: 08/19/2020 CLINICAL DATA:  Acute kidney injury EXAM: ULTRASOUND OF RENAL TRANSPLANT WITH RENAL DOPPLER ULTRASOUND TECHNIQUE: Ultrasound examination of the renal transplant was performed with gray-scale, color and duplex doppler evaluation. COMPARISON:  None. FINDINGS:  Transplant kidney location: Left lower quadrant Transplant Kidney: Renal measurements: 10.8 x 6.2 x 6.5 cm = volume: 220mL. Normal in size and parenchymal echogenicity. No evidence of mass or hydronephrosis. No peri-transplant fluid collection seen. Color flow in the main renal artery:  Yes Color flow in the main renal vein:  Yes Duplex Doppler Evaluation: Main Renal Artery Velocity:  cm/sec Main Renal Artery Resistive Index: 0.99 Venous waveform in main renal vein:  Present Intrarenal resistive index in upper pole:  0.98 (normal 0.6-0.8; equivocal 0.8-0.9; abnormal >= 0.9) Intrarenal resistive index in lower pole: 0.98 (normal 0.6-0.8; equivocal 0.8-0.9; abnormal >= 0.9) Bladder: Normal for degree of bladder distention. Other findings:  None. IMPRESSION: Elevated resistive indices within the left lower quadrant transplant kidney. No hydronephrosis. Electronically Signed   By: Rolm Baptise M.D.   On: 08/19/2020 19:15   DG Chest Portable 1 View  Result Date: 08/18/2020 CLINICAL DATA:  65 year old female with history of COVID  infection. Chest pain. Shortness of breath. EXAM: PORTABLE CHEST 1 VIEW COMPARISON:  Chest x-ray 10/20/2014. FINDINGS: Patchy multifocal airspace consolidation and areas of interstitial prominence are noted throughout the lungs bilaterally, most severe throughout the periphery of the mid to lower left lung, indicative of multilobar bilateral pneumonia. No definite pleural effusions. No evidence of pulmonary edema. No pneumothorax. Heart size is borderline enlarged. Upper mediastinal contours are within normal limits. Atherosclerotic calcifications in the thoracic aorta. Vascular stent projecting over the right subclavian region. IMPRESSION: 1. Severe multilobar bilateral (left greater than right) pneumonia compatible with reported COVID infection. 2. Aortic atherosclerosis. Electronically Signed   By: Vinnie Langton M.D.   On: 08/18/2020 18:02

## 2020-08-31 ENCOUNTER — Inpatient Hospital Stay (HOSPITAL_COMMUNITY): Payer: HMO

## 2020-08-31 DIAGNOSIS — N179 Acute kidney failure, unspecified: Secondary | ICD-10-CM | POA: Diagnosis not present

## 2020-08-31 DIAGNOSIS — I200 Unstable angina: Secondary | ICD-10-CM | POA: Diagnosis not present

## 2020-08-31 DIAGNOSIS — U071 COVID-19: Secondary | ICD-10-CM | POA: Diagnosis not present

## 2020-08-31 DIAGNOSIS — E875 Hyperkalemia: Secondary | ICD-10-CM | POA: Diagnosis not present

## 2020-08-31 DIAGNOSIS — I10 Essential (primary) hypertension: Secondary | ICD-10-CM | POA: Diagnosis not present

## 2020-08-31 LAB — CBC
HCT: 35 % — ABNORMAL LOW (ref 36.0–46.0)
Hemoglobin: 11.2 g/dL — ABNORMAL LOW (ref 12.0–15.0)
MCH: 24.5 pg — ABNORMAL LOW (ref 26.0–34.0)
MCHC: 32 g/dL (ref 30.0–36.0)
MCV: 76.6 fL — ABNORMAL LOW (ref 80.0–100.0)
Platelets: 128 10*3/uL — ABNORMAL LOW (ref 150–400)
RBC: 4.57 MIL/uL (ref 3.87–5.11)
RDW: 15.5 % (ref 11.5–15.5)
WBC: 17.3 10*3/uL — ABNORMAL HIGH (ref 4.0–10.5)
nRBC: 0 % (ref 0.0–0.2)

## 2020-08-31 LAB — COMPREHENSIVE METABOLIC PANEL
ALT: 118 U/L — ABNORMAL HIGH (ref 0–44)
AST: 47 U/L — ABNORMAL HIGH (ref 15–41)
Albumin: 2.9 g/dL — ABNORMAL LOW (ref 3.5–5.0)
Alkaline Phosphatase: 92 U/L (ref 38–126)
Anion gap: 10 (ref 5–15)
BUN: 66 mg/dL — ABNORMAL HIGH (ref 8–23)
CO2: 25 mmol/L (ref 22–32)
Calcium: 9.8 mg/dL (ref 8.9–10.3)
Chloride: 105 mmol/L (ref 98–111)
Creatinine, Ser: 1.87 mg/dL — ABNORMAL HIGH (ref 0.44–1.00)
GFR, Estimated: 29 mL/min — ABNORMAL LOW (ref >60)
Glucose, Bld: 162 mg/dL — ABNORMAL HIGH (ref 70–99)
Potassium: 4.5 mmol/L (ref 3.5–5.1)
Sodium: 140 mmol/L (ref 135–145)
Total Bilirubin: 0.9 mg/dL (ref 0.3–1.2)
Total Protein: 6.3 g/dL — ABNORMAL LOW (ref 6.5–8.1)

## 2020-08-31 LAB — C-REACTIVE PROTEIN: CRP: 0.6 mg/dL (ref <1.0)

## 2020-08-31 LAB — GLUCOSE, CAPILLARY
Glucose-Capillary: 205 mg/dL — ABNORMAL HIGH (ref 70–99)
Glucose-Capillary: 198 mg/dL — ABNORMAL HIGH (ref 70–99)
Glucose-Capillary: 255 mg/dL — ABNORMAL HIGH (ref 70–99)
Glucose-Capillary: 269 mg/dL — ABNORMAL HIGH (ref 70–99)

## 2020-08-31 LAB — TROPONIN I (HIGH SENSITIVITY)
Troponin I (High Sensitivity): 117 ng/L (ref <18)
Troponin I (High Sensitivity): 123 ng/L (ref <18)
Troponin I (High Sensitivity): 140 ng/L (ref <18)

## 2020-08-31 LAB — BRAIN NATRIURETIC PEPTIDE: B Natriuretic Peptide: 235.3 pg/mL — ABNORMAL HIGH (ref 0.0–100.0)

## 2020-08-31 LAB — D-DIMER, QUANTITATIVE: D-Dimer, Quant: 0.88 ug/mL-FEU — ABNORMAL HIGH (ref 0.00–0.50)

## 2020-08-31 MED ORDER — NITROGLYCERIN 2 % TD OINT
0.5000 [in_us] | TOPICAL_OINTMENT | Freq: Four times a day (QID) | TRANSDERMAL | Status: DC
  Administered 2020-08-31 – 2020-09-09 (×35): 0.5 [in_us] via TOPICAL
  Filled 2020-08-31 (×4): qty 30

## 2020-08-31 MED ORDER — ATORVASTATIN CALCIUM 80 MG PO TABS: 80.0000 mg | ORAL_TABLET | Freq: Every day | ORAL | Status: DC

## 2020-08-31 MED ORDER — NITROGLYCERIN IN D5W 200-5 MCG/ML-% IV SOLN
0.0000 ug/min | INTRAVENOUS | Status: DC
  Filled 2020-08-31: qty 250

## 2020-08-31 MED ORDER — HEPARIN (PORCINE) 25000 UT/250ML-% IV SOLN
800.0000 [IU]/h | INTRAVENOUS | Status: DC
  Administered 2020-08-31: 950 [IU]/h via INTRAVENOUS
  Filled 2020-08-31 (×2): qty 250

## 2020-08-31 MED ORDER — ONDANSETRON HCL 4 MG/2ML IJ SOLN
4.0000 mg | Freq: Four times a day (QID) | INTRAMUSCULAR | Status: DC | PRN
  Administered 2020-08-31: 4 mg via INTRAVENOUS
  Filled 2020-08-31: qty 2

## 2020-08-31 MED ORDER — ASPIRIN EC 325 MG PO TBEC
325.0000 mg | DELAYED_RELEASE_TABLET | Freq: Once | ORAL | Status: AC
  Administered 2020-08-31: 325 mg via ORAL
  Filled 2020-08-31: qty 1

## 2020-08-31 MED ORDER — NITROGLYCERIN 0.4 MG SL SUBL
0.4000 mg | SUBLINGUAL_TABLET | SUBLINGUAL | Status: DC | PRN
  Administered 2020-08-31 – 2020-09-03 (×4): 0.4 mg via SUBLINGUAL
  Filled 2020-08-31 (×4): qty 1

## 2020-08-31 MED ORDER — MORPHINE SULFATE (PF) 2 MG/ML IV SOLN
1.0000 mg | INTRAVENOUS | Status: DC | PRN
  Administered 2020-08-31: 1 mg via INTRAVENOUS
  Filled 2020-08-31: qty 1

## 2020-08-31 MED ORDER — PREDNISONE 20 MG PO TABS
40.0000 mg | ORAL_TABLET | Freq: Every day | ORAL | Status: DC
  Administered 2020-09-01 – 2020-09-02 (×2): 40 mg via ORAL
  Filled 2020-08-31 (×2): qty 2

## 2020-08-31 MED ORDER — ASPIRIN EC 81 MG PO TBEC: 81.0000 mg | DELAYED_RELEASE_TABLET | Freq: Every day | ORAL | Status: DC

## 2020-08-31 MED ORDER — SODIUM CHLORIDE 0.9 % IV BOLUS
1000.0000 mL | Freq: Every day | INTRAVENOUS | Status: DC | PRN
  Administered 2020-08-31: 1000 mL via INTRAVENOUS

## 2020-08-31 MED ORDER — ATORVASTATIN CALCIUM 40 MG PO TABS
40.0000 mg | ORAL_TABLET | Freq: Every day | ORAL | Status: DC
  Administered 2020-08-31 – 2020-09-09 (×10): 40 mg via ORAL
  Filled 2020-08-31 (×10): qty 1

## 2020-08-31 MED ORDER — NITROGLYCERIN 0.4 MG SL SUBL
SUBLINGUAL_TABLET | SUBLINGUAL | Status: AC
  Administered 2020-08-31: 0.4 mg via SUBLINGUAL
  Filled 2020-08-31: qty 1

## 2020-08-31 MED ORDER — ASPIRIN EC 81 MG PO TBEC
81.0000 mg | DELAYED_RELEASE_TABLET | Freq: Every day | ORAL | Status: DC
  Administered 2020-09-01 – 2020-10-13 (×43): 81 mg via ORAL
  Filled 2020-08-31 (×43): qty 1

## 2020-08-31 NOTE — Progress Notes (Incomplete)
Patient seen and discussed with PA Sarajane Jews, I agree with her documentation. 65 yo female history of prior renal transplant on immunosuppressants, DM2, admitted with COVID 19 pneumonia with SOB, body aches, fatigue on Jan 11. Managed by primary team this admission,as well as AKI on CKD of transplanted kidney followed by nephrology. High O2 demand inititally, has improved signifcantly over admission.   Sinus pause on Jan 20 whle sleeping 4.25 sec, beta blocker stopped.    Cardiology consulted today due to chest pain and EKG changes   K 4.5 Cr 1.87 GFR 29 WBC 17.3 Hgb 11.2 Plt 128 Ddimer 0.88  Jan 16 CT chest: moderate bilateral airspace disease Echo pending EKG SR, diffuse ST depressions, aVR elevation   COVID patient with chest pain symptoms. Prior EKGs have had some intermittent ST/T changes, EKGs today with diffuse ST depressions and some aVR elevation suggesting possible global ischemia. Trops and echo are pending. Factors affecting management include AKI on CKD of transplanted kidney with GFR29, COVID+with ongoing symptoms and hypoxia.   Findings and history concerning for unstable angina, perhaps NSTEMI once we get more data back.  Medical therapy with ASA 81, atorva 40, hep gtt. Had sinus pause 4 sec on beta blocker, avoiding use. F/u trop trend and echo. Ideally would medically  manage initially given the renal failure and COVID + status still symptomatic and hypoxic, perhaps cath when these medical issues have further improved. If severe refractory symptoms or high risk findings may have to consider cath earlier. Currently chest pain has resolved.      Carlyle Dolly MD

## 2020-08-31 NOTE — Progress Notes (Signed)
CRITICAL VALUE ALERT  Critical Value:  Troponin 123  Date & Time Notied:  08/31/20  1948  Provider Notified: Hal Hope, MD  Orders Received/Actions taken: Called at 62. Troponin ordered.

## 2020-08-31 NOTE — Progress Notes (Signed)
ANTICOAGULATION CONSULT NOTE - Initial Consult  Pharmacy Consult for IV heparin Indication: chest pain/ACS  Allergies  Allergen Reactions  . Codeine Hives  . Acetaminophen-Codeine Rash    Patient Measurements: Height: 5\' 2"  (157.5 cm) Weight: 86.2 kg (190 lb 0.6 oz) IBW/kg (Calculated) : 50.1 Heparin Dosing Weight: 70 kg  Vital Signs: Temp: 97.7 F (36.5 C) (01/23 1211) Temp Source: Oral (01/23 1211) BP: 101/73 (01/23 1417) Pulse Rate: 99 (01/23 1417)  Labs: Recent Labs    08/29/20 0128 08/30/20 0502 08/31/20 0135  HGB 10.9* 11.2* 11.2*  HCT 31.7* 34.2* 35.0*  PLT 146* 126* 128*  CREATININE 1.94* 1.96* 1.87*    Estimated Creatinine Clearance: 30.5 mL/min (A) (by C-G formula based on SCr of 1.87 mg/dL (H)).   Medical History: Past Medical History:  Diagnosis Date  . Blood transfusion   . Chronic kidney disease    s/p transplant, kidney wasn't removed  . Gout due to renal impairment 2010   "before finding out I had kidney failure"  . History of renal transplant   . Hyperlipidemia   . Hypertension   . Sickle cell trait ([place])   . Thyroid disease   . Type II diabetes mellitus ([place])   . UTI (lower urinary tract infection) 12/08/11   "first one ever"    Medications:  Infusions:  . sodium chloride    . sodium chloride 1,000 mL (08/31/20 1412)    Assessment: 65 yo female admitted with COVID-19 PNA, hx renal transplant, now with chest pain.  Receiving SQ heparin 7500 units TID for prophylaxis - last dose given at 1330 PM.  Pharmacy asked to start IV heparin for r/o ACS.  CBC stable, no overt bleeding or complications noted.  Goal of Therapy:  Heparin level 0.3-0.7 units/ml Monitor platelets by anticoagulation protocol: Yes   Plan:  Start IV Heparin at 950 units/hr, no bolus given recent SQ heparin. Check heparin level 8 hrs after gtt started. Daily heparin level and CBC. F/u plans for further cards workup.  [place] Crane, Roylene Reason,  [facility] Clinical Pharmacist  08/31/2020 2:57 PM   [facility] pharmacy phone numbers are listed on [email or web address]

## 2020-08-31 NOTE — Progress Notes (Signed)
PROGRESS NOTE                                                                                                                                                                                                             Patient Demographics:    [name] [name], is a 65 y.o. female, DOB - [birth year], BWL:[id number]  Outpatient Primary MD for the patient is Glendale Chard, MD   Admit date - 08/18/2020   LOS - 12  Chief Complaint  Patient presents with  . Covid Positive  . Chest Pain  . Weakness       Brief Narrative: Patient is a 65 y.o. female with PMHx of renal transplant x2 (latest in 2016) on chronic immunosuppressive agents, DM-2, hypothyroidism-who presented with shortness of breath-found to have acute hypoxic respiratory failure due to COVID-19 pneumonia.  COVID-19 vaccinated status: Vaccinated including booster  Significant Events: 1/10>> Admit to [facility] for acute hypoxic respiratory failure due to COVID-19 pneumonia 1/11>> transfer to ICU due to worsening hypoxemia, AKI/metabolic acidosis/hyperkalemia. 1/14>> transferred to [facility]  Significant studies: 1/10>> chest x-ray: Severe multilobar bilateral pneumonia. 1/11>> ultrasound renal transplant: No hydronephrosis 1/16>> CT chest: Multifocal viral pneumonia  COVID-19 medications: Steroids: 1/10>> Remdesivir: 1/10>>1/15 Baricitinib: 1/16>>  Antibiotics: Rocephin: 1/12>> 1/18  Microbiology data: 1/10 >>blood culture: No growth 1/11>> urine culture: Klebsiella pneumoniae  Procedures: None  Consults: PCCM, nephrology  DVT prophylaxis: heparin injection 7,500 Units Start: 08/25/20 1400 SCDs Start: 08/19/20 0933    Subjective:   Continues to improve-titrated down to 3-4 L of oxygen this morning.  Looks very debilitated-Per nursing staff-desaturates significantly with minimal movement.   Assessment  & Plan :   Acute Hypoxic Resp Failure due to  Covid 19 Viral pneumonia: Had severe hypoxemia on initial presentation-was on heated high flow but has shown remarkable improvement over the past few days-Down to 3-4 L of oxygen this morning.  Continue to taper steroids-remains on baricitinib.  Volume status stable.  Continue to attempt to titrate down FiO2.   Fever: afebrile O2 requirements:  SpO2: 98 % O2 Flow Rate (L/min): 6 L/min FiO2 (%): 40 %   COVID-19 Labs: Recent Labs    08/29/20 0128 08/30/20 0502 08/31/20 0135  DDIMER 1.07* 1.27* 0.88*  CRP  --  0.5 0.6    No results found for: BNP  Recent Labs  Lab  08/25/20 0117 08/26/20 0111  PROCALCITON 0.12 <0.10    Lab Results  Component Value Date   SARSCOV2NAA NEGATIVE 04/27/2020   [place] Not Detected 01/30/2019     Prone/Incentive Spirometry: encouraged  incentive spirometry use 3-4/hour.  Sinus pause: Occurred on 1/20-approximately 4.25 seconds-this was while she was sleeping-stopped beta-blocker on 1/20.  Case discussed with cardiology-since this occurred sleeping-and patient was without symptoms-no further work-up required.  Recommendations were for outpatient heart monitoring.  Continue telemetry monitoring.  AKI on CKD stage IV-transplanted kidney: AKI hemodynamically mediated-creatinine improved but seems to have plateaued lately.  Nephrology following and directing care.   History of renal transplant (second renal transplant-01/2015): Remains on Prograf-on steroids-Myfortic on hold.  Nephrology has signed off.  Hyperkalemia/anion gap metabolic acidosis: Secondary to AKI-has resolved.  Complicated UTI: Completed a course of Rocephin.  Afebrile and nontoxic-appearing.  DKA: Resolved-no longer on insulin drip.  DM-2 (A1c 8.2 on 1/11) with uncontrolled hyperglycemia due to steroids: CBG stable-continue Levemir 18 units twice daily, 6 units of NovoLog with meals and SSI.  Follow and adjust.    Recent Labs    08/30/20 1749 08/30/20 2118 08/31/20 0740   GLUCAP 207* 224* 205*    Hypothyroidism: Continue levothyroxine-given suppressed TSH-I have decreased dosage to 50 mcg-repeat TSH in 3 months.  HTN: BP stable-continue amlodipine, Lopressor and hydralazine.    HLD: Continue statin  Obesity: Estimated body mass index is 34.76 kg/m as calculated from the following:   Height as of this encounter: 5\' 2"  (1.575 m).   Weight as of this encounter: 86.2 kg.   RN pressure injury documentation: Pressure Injury 08/20/20 Coccyx Mid Stage 1 -  Intact skin with non-blanchable redness of a localized area usually over a bony prominence. (Active)  08/20/20 0800  Location: Coccyx  Location Orientation: Mid  Staging: Stage 1 -  Intact skin with non-blanchable redness of a localized area usually over a bony prominence.  Wound Description (Comments):   Present on Admission: Yes    GI prophylaxis: PPI  ABG:    Component Value Date/Time   PHART 7.266 (L) 08/19/2020 0808   PCO2ART 32.3 08/19/2020 0808   PO2ART 186 (H) 08/19/2020 0808   HCO3 19.0 (L) 08/20/2020 1101   TCO2 16 (L) 08/19/2020 0808   ACIDBASEDEF 6.4 (H) 08/20/2020 1101   O2SAT 85.1 08/20/2020 1101    Vent Settings: N/A    Condition - Extremely Guarded  Family Communication  :  Daughter Mendel Ryder [phone] updated on 1/23  Code Status :  Full Code  Diet :  Diet Order            Diet Carb Modified Fluid consistency: Thin; Room service appropriate? Yes  Diet effective now                  Disposition Plan  :   Status is: Inpatient  Remains inpatient appropriate because:Inpatient level of care appropriate due to severity of illness   Dispo: The patient is from: Home              Anticipated d/c is to: SNF              Anticipated d/c date is: > 3 days              Patient currently is not medically stable to d/c.   Barriers to discharge: Hypoxia requiring O2 supplementation  Antimicorbials  :    Anti-infectives (From admission, onward)   Start      Dose/Rate  Route Frequency Ordered Stop   08/20/20 1445  cefTRIAXone (ROCEPHIN) 1 g in sodium chloride 0.9 % 100 mL IVPB        1 g 200 mL/hr over 30 Minutes Intravenous Every 24 hours 08/20/20 1348 08/26/20 2147   08/20/20 1000  remdesivir 100 mg in sodium chloride 0.9 % 100 mL IVPB       "Followed by" Linked Group Details   100 mg 200 mL/hr over 30 Minutes Intravenous Daily 08/19/20 0021 08/23/20 1113   08/19/20 0130  remdesivir 200 mg in sodium chloride 0.9% 250 mL IVPB       "Followed by" Linked Group Details   200 mg 580 mL/hr over 30 Minutes Intravenous Once 08/19/20 0021 08/19/20 0314      Inpatient Medications  Scheduled Meds: . amLODipine  10 mg Oral Daily  . vitamin C  500 mg Oral Daily  . aspirin EC  81 mg Oral Daily  . baricitinib  1 mg Oral Daily  . feeding supplement (NEPRO CARB STEADY)  237 mL Oral BID BM  . fluticasone  1 spray Each Nare Daily  . heparin  7,500 Units Subcutaneous Q8H  . hydrALAZINE  25 mg Oral Q8H  . insulin aspart  0-15 Units Subcutaneous TID WC  . insulin aspart  6 Units Subcutaneous TID WC  . insulin detemir  18 Units Subcutaneous BID  . levothyroxine  50 mcg Oral Q0600  . mouth rinse  15 mL Mouth Rinse BID  . methylPREDNISolone (SOLU-MEDROL) injection  40 mg Intravenous Q12H  . pantoprazole  40 mg Oral Daily  . simvastatin  10 mg Oral q1800  . sodium bicarbonate  650 mg Oral TID  . tacrolimus  3 mg Oral QHS  . tacrolimus  4 mg Oral Daily  . zinc sulfate  220 mg Oral Daily   Continuous Infusions: . sodium chloride     PRN Meds:.sodium chloride, acetaminophen **OR** acetaminophen, guaiFENesin, hydrALAZINE, labetalol, loperamide, polyethylene glycol   Time Spent in minutes  25    See all Orders from today for further details   Oren Binet M.D on 08/31/2020 at 11:22 AM  To page go to [email or web address] - use universal password  [facility] -  Office  [phone]    Objective:   Vitals:   08/30/20 1135 08/30/20 2116  08/31/20 0400 08/31/20 0820  BP: (!) 157/68 (!) 152/66 137/61 (!) 152/76  Pulse: 91 98 92   Resp: 20 18 (!) 22   Temp: 97.8 F (36.6 C) 98.4 F (36.9 C) 98.6 F (37 C)   TempSrc: Oral Oral Axillary   SpO2: 96% 90% 98%   Weight:   86.2 kg   Height:        Wt Readings from Last 3 Encounters:  08/31/20 86.2 kg  05/14/20 92.5 kg  05/14/20 92.9 kg     Intake/Output Summary (Last 24 hours) at 08/31/2020 1122 Last data filed at 08/30/2020 1417 Gross per 24 hour  Intake --  Output 350 ml  Net -350 ml     Physical Exam Gen Exam:Alert awake-not in any distress HEENT:atraumatic, normocephalic Chest: B/L clear to auscultation anteriorly CVS:S1S2 regular Abdomen:soft non tender, non distended Extremities:no edema Neurology: Non focal Skin: no rash   Data Review:    CBC Recent Labs  Lab 08/25/20 0117 08/26/20 0111 08/28/20 0551 08/29/20 0128 08/30/20 0502 08/31/20 0135  WBC 8.6 10.1 11.8* 16.0* 15.5* 17.3*  HGB 11.3* 10.4* 10.8* 10.9* 11.2* 11.2*  HCT 33.8* 31.0* 33.0* 31.7* 34.2*  35.0*  PLT 152 132* 138* 146* 126* 128*  MCV 77.2* 74.9* 75.0* 74.1* 76.3* 76.6*  MCH 25.8* 25.1* 24.5* 25.5* 25.0* 24.5*  MCHC 33.4 33.5 32.7 34.4 32.7 32.0  RDW 14.5 14.1 14.2 14.6 15.0 15.5  LYMPHSABS 0.4* 0.4* 0.4* 0.4*  --   --   MONOABS 0.4 0.5 0.5 0.8  --   --   EOSABS 0.0 0.0 0.0 0.0  --   --   BASOSABS 0.0 0.0 0.0 0.1  --   --     Chemistries  Recent Labs  Lab 08/26/20 0111 08/26/20 0526 08/28/20 0551 08/29/20 0128 08/30/20 0502 08/31/20 0135  NA 135  --  138 135 141 140  K 4.1  --  4.1 4.0 4.7 4.5  CL 98  --  101 101 104 105  CO2 24  --  25 24 25 25   GLUCOSE 298*  --  208* 177* 144* 162*  BUN 68*  --  67* 75* 71* 66*  CREATININE 1.96*  --  1.91* 1.94* 1.96* 1.87*  CALCIUM 9.3  --  9.9 9.8 9.5 9.8  MG  --  2.1  --   --   --   --   AST 16  --  18 30 35 47*  ALT 14  --  24 60* 90* 118*  ALKPHOS 66  --  66 75 82 92  BILITOT 0.8  --  0.8 0.8 0.7 0.9    ------------------------------------------------------------------------------------------------------------------ No results for input(s): CHOL, HDL, LDLCALC, TRIG, CHOLHDL, LDLDIRECT in the last 72 hours.  Lab Results  Component Value Date   HGBA1C 8.2 (H) 08/19/2020   ------------------------------------------------------------------------------------------------------------------ Recent Labs    08/29/20 0128  TSH 0.114*   ------------------------------------------------------------------------------------------------------------------ No results for input(s): VITAMINB12, FOLATE, FERRITIN, TIBC, IRON, RETICCTPCT in the last 72 hours.  Coagulation profile No results for input(s): INR, PROTIME in the last 168 hours.  Recent Labs    08/30/20 0502 08/31/20 0135  DDIMER 1.27* 0.88*    Cardiac Enzymes No results for input(s): CKMB, TROPONINI, MYOGLOBIN in the last 168 hours.  Invalid input(s): CK ------------------------------------------------------------------------------------------------------------------ No results found for: BNP  Micro Results No results found for this or any previous visit (from the past 240 hour(s)).  Radiology Reports CT CHEST WO CONTRAST  Result Date: 08/24/2020 CLINICAL DATA:  Severe hypoxia and respiratory failure with COVID-19 infection. EXAM: CT CHEST WITHOUT CONTRAST TECHNIQUE: Multidetector CT imaging of the chest was performed following the standard protocol without IV contrast. COMPARISON:  Chest x-ray 08/18/2020 FINDINGS: Cardiovascular: Mild cardiomegaly. Mild calcification over the mitral valve annulus. Thoracic aorta is normal caliber. Stent over the right subclavian/axillary region. Remaining vascular structures are unremarkable. Mediastinum/Nodes: No evidence of mediastinal or hilar adenopathy. Remaining mediastinal structures are unremarkable. Lungs/Pleura: Lungs are adequately inflated demonstrate moderate bilateral hazy airspace  process likely multifocal viral pneumonia in this COVID-19 positive patient. No evidence of effusion. Airways are otherwise unremarkable. Upper Abdomen: No acute findings. Musculoskeletal: Degenerative change of the spine. Mild anterior wedging of a midthoracic vertebral body likely chronic. IMPRESSION: 1. Moderate bilateral hazy airspace process likely multifocal viral pneumonia in this COVID-19 positive patient. 2. Mild cardiomegaly. 3. Mild anterior wedging of a midthoracic vertebral body likely chronic. Electronically Signed   By: Marin Olp M.D.   On: 08/24/2020 12:37   US Renal Transplant w/Doppler  Result Date: 08/19/2020 CLINICAL DATA:  Acute kidney injury EXAM: ULTRASOUND OF RENAL TRANSPLANT WITH RENAL DOPPLER ULTRASOUND TECHNIQUE: Ultrasound examination of the renal transplant was performed with  gray-scale, color and duplex doppler evaluation. COMPARISON:  None. FINDINGS: Transplant kidney location: Left lower quadrant Transplant Kidney: Renal measurements: 10.8 x 6.2 x 6.5 cm = volume: 236mL. Normal in size and parenchymal echogenicity. No evidence of mass or hydronephrosis. No peri-transplant fluid collection seen. Color flow in the main renal artery:  Yes Color flow in the main renal vein:  Yes Duplex Doppler Evaluation: Main Renal Artery Velocity:  cm/sec Main Renal Artery Resistive Index: 0.99 Venous waveform in main renal vein:  Present Intrarenal resistive index in upper pole:  0.98 (normal 0.6-0.8; equivocal 0.8-0.9; abnormal >= 0.9) Intrarenal resistive index in lower pole: 0.98 (normal 0.6-0.8; equivocal 0.8-0.9; abnormal >= 0.9) Bladder: Normal for degree of bladder distention. Other findings:  None. IMPRESSION: Elevated resistive indices within the left lower quadrant transplant kidney. No hydronephrosis. Electronically Signed   By: Rolm Baptise M.D.   On: 08/19/2020 19:15   DG Chest Portable 1 View  Result Date: 08/18/2020 CLINICAL DATA:  65 year old female with history of COVID  infection. Chest pain. Shortness of breath. EXAM: PORTABLE CHEST 1 VIEW COMPARISON:  Chest x-ray 10/20/2014. FINDINGS: Patchy multifocal airspace consolidation and areas of interstitial prominence are noted throughout the lungs bilaterally, most severe throughout the periphery of the mid to lower left lung, indicative of multilobar bilateral pneumonia. No definite pleural effusions. No evidence of pulmonary edema. No pneumothorax. Heart size is borderline enlarged. Upper mediastinal contours are within normal limits. Atherosclerotic calcifications in the thoracic aorta. Vascular stent projecting over the right subclavian region. IMPRESSION: 1. Severe multilobar bilateral (left greater than right) pneumonia compatible with reported COVID infection. 2. Aortic atherosclerosis. Electronically Signed   By: Vinnie Langton M.D.   On: 08/18/2020 18:02

## 2020-08-31 NOTE — Progress Notes (Signed)
Informed by nurse earlier this afternoon that patient having left-sided chest pain.  On arrival-pain 8/10-left side/central area-pressure-like-not radiating to chest/left shoulder/back. But associated with some dizziness.  EKG: Diffuse T wave inversions  Troponins: Pending  Given 3 sublingual nitroglycerin-with minimal resolution-subsequently given IV morphine/Zofran-with gradual improvement.  Pain down to 2-3/10.  However blood pressure transiently dropped to 70 systolic-started IV fluid bolus with normal saline-with rapid improvement in blood pressure-last check in the low 902 systolic range.  Discussed x2 with Dr. Daune Perch reviewed-recommends we continue to manage medically-starting full dose aspirin/statin/IV heparin-if BP tolerates will start Imdur.  Given more than 4-second pause a couple of days ago-avoiding beta-blocker as much as possible.  Cardiology will formally evaluate and provide recommendations.

## 2020-08-31 NOTE — Progress Notes (Signed)
CRITICAL VALUE ALERT  Critical Value:  Troponin 140, BNP 235.3  Date & Time Notied:  08/31/20  2324  Provider Notified:  Hal Hope, MD, [place] back 2325.  Orders Received/Actions taken:  No new orders

## 2020-08-31 NOTE — Consult Note (Signed)
Cardiology Consultation:   Patient ID: [name] [name] MRN: [id number]; DOB: Oct 26, 1955  Admit date: 08/18/2020 Date of Consult: 08/31/2020  Primary Care Provider: Glendale Chard, MD [facility] Cardiologist: [facility] Electrophysiologist:  None    Patient Profile:   [name] [name] is a 65 y.o. female with a history of hypertension, hyperlipidemia, type 2 diabetes mellitus, hypothyroidism, and s/p renal transplant x2 most recently in 2016 who is being seen today for the evaluation of chest pain in setting of COVID at the request of Dr. Sloan Leiter.  History of Present Illness:   [name] [name] is a 65 year old female with the above history. No known cardiac history.   Patient was admitted on 08/18/2020 with acute hypoxic respiratory failure secondary to COVID pneumonia.  Chest x-ray showed severe multilobar bilateral pneumonia.  He was transferred to the ICU the following day for worsening hypoxemia as well as AKI/metabolic acidosis/hyperkalemia and DKA. He has been treated with Remdesivir, steroids, baricitinib for his COVID. Supplemental O2 has been able to be weaned and she is currently on 3 to 4 L of O2.  Creatinine peaked at 3.99 on 08/19/2020.  Given prior renal transplant, nephrology was consulted and it was felt that AKI was hemodynamically mediated.  Creatinine has improved and is back down to 1.87 today. Patient also found to have a UTI this admission and has been treated with Rocephin. She was noted to have a 4.25 second pause while sleeping on  08/28/2020. Beta-blocker was stopped outpatient cardiac monitor was recommended.   Cardiology consulted today after patient developed chest pain.    Past Medical History:  Diagnosis Date  . Blood transfusion   . Chronic kidney disease    s/p transplant, kidney wasn't removed  . Gout due to renal impairment 2010   "before finding out I had kidney failure"  . History of renal transplant   . Hyperlipidemia   .  Hypertension   . Sickle cell trait ([place])   . Thyroid disease   . Type II diabetes mellitus ([place])   . UTI (lower urinary tract infection) 12/08/11   "first one ever"    Past Surgical History:  Procedure Laterality Date  . BREAST BIOPSY Left 08/07/2010   benign stereo  . kidney transplant  02/2009   August 2016 was second transplant  . PARATHYROIDECTOMY    . TUBAL LIGATION  1980's  . VAGINAL HYSTERECTOMY  2006     Home Medications:  Prior to Admission medications   Medication Sig Start Date End Date Taking? Authorizing Provider  Alcohol Swabs (ALCOHOL PREP) PADS Use as directed 5 times per day with insulin pen 08/21/19  Yes Glendale Chard, MD  amLODipine (NORVASC) 5 MG tablet Take 5 mg by mouth daily.    Yes [provider]  aspirin EC 81 MG tablet Take 81 mg by mouth daily.   Yes [provider]  Blood Glucose Monitoring Suppl (ACCU-CHEK GUIDE ME) w/Device KIT USE AS DIRECTED ONCE A DAY 02/08/20  Yes [provider]  Blood Glucose Monitoring Suppl (ACCU-CHEK GUIDE) w/Device KIT USE AS DIRECTED ONCE A DAY 02/12/20  Yes [provider]  cinacalcet (SENSIPAR) 30 MG tablet Take 30 mg by mouth daily.   Yes [provider]  diazepam (VALIUM) 2 MG tablet Take 1 tablet (2 mg total) by mouth every 12 (twelve) hours as needed for muscle spasms. 10/17/19 10/16/20 Yes Glendale Chard, MD  glucose blood test strip USE TO TEST BLOOD SUGAR 4 TIMES DAILY AS  DIRECTED 01/25/18  Yes [provider]  GVOKE HYPOPEN 2-PACK 1 MG/0.2ML SOAJ Use in case of low blood sugar 08/14/20  Yes Ghumman, Ramandeep, NP  Insulin Aspart (NOVOLOG FLEXPEN [place]) Inject 4 Units into the skin 3 (three) times daily. Sliding scale   Yes [provider]  insulin detemir (LEVEMIR) 100 UNIT/ML FlexPen Inject 30-35 Units into the skin See admin instructions. 30 units in the morning and 35 units in the evening 03/29/18  Yes [provider]  levothyroxine (SYNTHROID) 75 MCG  tablet Take 75 mcg by mouth daily before breakfast. 02/07/19  Yes [provider]  lisinopril (PRINIVIL,ZESTRIL) 10 MG tablet Take 10 mg by mouth daily. 03/06/18  Yes [provider]  loperamide (IMODIUM) 2 MG capsule Take by mouth as needed for diarrhea or loose stools. One by mouth four times daily.   Yes [provider]  metoprolol tartrate (LOPRESSOR) 25 MG tablet Take 75 mg by mouth 2 (two) times daily. 3 tabs 2 times per day   Yes [provider]  mometasone (NASONEX) 50 MCG/ACT nasal spray Place 2 sprays into the nose daily. 10/11/14  Yes Orpah Greek, MD  mycophenolate (MYFORTIC) 180 MG EC tablet Take 360 mg by mouth 2 (two) times daily.    Yes [provider]  omeprazole (PRILOSEC) 20 MG capsule Take 20 mg by mouth daily.   Yes [provider]  predniSONE (DELTASONE) 5 MG tablet Take 5 mg by mouth daily with breakfast.   Yes [provider]  simvastatin (ZOCOR) 10 MG tablet TAKE 1 TABLET BY MOUTH EVERY DAY IN THE EVENING Patient taking differently: Take 10 mg by mouth daily at 6 PM. 10/29/19  Yes Glendale Chard, MD  tacrolimus (PROGRAF) 1 MG capsule 7 capsules in the morning, 6 capsules in the evening   Yes [provider]    Inpatient Medications: Scheduled Meds: . amLODipine  10 mg Oral Daily  . vitamin C  500 mg Oral Daily  . [START ON 09/01/2020] aspirin EC  81 mg Oral Daily  . atorvastatin  40 mg Oral Daily  . baricitinib  1 mg Oral Daily  . feeding supplement (NEPRO CARB STEADY)  237 mL Oral BID BM  . fluticasone  1 spray Each Nare Daily  . hydrALAZINE  25 mg Oral Q8H  . insulin aspart  0-15 Units Subcutaneous TID WC  . insulin aspart  6 Units Subcutaneous TID WC  . insulin detemir  18 Units Subcutaneous BID  . levothyroxine  50 mcg Oral Q0600  . mouth rinse  15 mL Mouth Rinse BID  . pantoprazole  40 mg Oral Daily  . [START ON 09/01/2020] predniSONE  40 mg Oral Q breakfast  . sodium bicarbonate  650  mg Oral TID  . tacrolimus  3 mg Oral QHS  . tacrolimus  4 mg Oral Daily  . zinc sulfate  220 mg Oral Daily   Continuous Infusions: . sodium chloride    . heparin    . sodium chloride Stopped (08/31/20 1545)   PRN Meds: sodium chloride, acetaminophen **OR** acetaminophen, guaiFENesin, hydrALAZINE, labetalol, loperamide, morphine injection, nitroGLYCERIN, ondansetron (ZOFRAN) IV, polyethylene glycol, sodium chloride  Allergies:    Allergies  Allergen Reactions  . Codeine Hives  . Acetaminophen-Codeine Rash    Social History:   Social History   Socioeconomic History  . Marital status: Married    Spouse name: Not on file  . Number of children: Not on file  . Years of education:  Not on file  . Highest education level: Not on file  Occupational History  . Occupation: retired  Tobacco Use  . Smoking status: Former Smoker    Packs/day: 0.50    Years: 10.00    Pack years: 5.00    Types: Cigarettes    Quit date: 11/07/1988    Years since quitting: 31.8  . Smokeless tobacco: Never Used  Vaping Use  . Vaping Use: Never used  Substance and Sexual Activity  . Alcohol use: No  . Drug use: No  . Sexual activity: Yes  Other Topics Concern  . Not on file  Social History Narrative  . Not on file   Social Determinants of Health   Financial Resource Strain: Low Risk   . Difficulty of Paying Living Expenses: Not hard at all  Food Insecurity: No Food Insecurity  . Worried About Charity fundraiser in the Last Year: Never true  . Ran Out of Food in the Last Year: Never true  Transportation Needs: No Transportation Needs  . Lack of Transportation (Medical): No  . Lack of Transportation (Non-Medical): No  Physical Activity: Insufficiently Active  . Days of Exercise per Week: 1 day  . Minutes of Exercise per Session: 20 min  Stress: No Stress Concern Present  . Feeling of Stress : Not at all  Social Connections: Moderately Integrated  . Frequency of Communication with Friends  and Family: More than three times a week  . Frequency of Social Gatherings with Friends and Family: More than three times a week  . Attends Religious Services: More than 4 times per year  . Active Member of Clubs or Organizations: Not on file  . Attends Archivist Meetings: More than 4 times per year  . Marital Status: Widowed  Intimate Partner Violence: Not on file    Family History:   Family History  Problem Relation Age of Onset  . Heart disease Father   . Hypertension Mother   . Breast cancer Cousin   . Hypertension Sister   . Hypertension Daughter   . Colon cancer Neg Hx   . Esophageal cancer Neg Hx   . Rectal cancer Neg Hx   . Stomach cancer Neg Hx      ROS:  Please see the history of present illness.  All other ROS reviewed and negative.     Physical Exam/Data:   Vitals:   08/31/20 1417 08/31/20 1441 08/31/20 1512 08/31/20 1549  BP: 101/73  (!) 147/74 (!) 151/74  Pulse: 99 (!) 110 99 (!) 104  Resp: 12 (!) 22 (!) 25 20  Temp:      TempSrc:      SpO2: 100%  100% 97%  Weight:  86.2 kg    Height:  _0  (1.575 m)      Intake/Output Summary (Last 24 hours) at 08/31/2020 1553 Last data filed at 08/31/2020 0814 Gross per 24 hour  Intake 120 ml  Output --  Net 120 ml   Last 3 Weights 08/31/2020 08/31/2020 08/30/2020  Weight (lbs) 190 lb 0.6 oz 190 lb 0.6 oz 189 lb 6 oz  Weight (kg) 86.2 kg 86.2 kg 85.9 kg     Body mass index is 34.76 kg/m.  General:  Well nourished, well developed, in no acute distress HEENT: normal Lymph: no adenopathy Neck: no JVD Vascular: No carotid bruits; FA pulses 2+ bilaterally without bruits  Cardiac:  normal S1, S2; RRR; no murmur  Lungs:  Coarse breath sounds bilateral bases  Abd: soft, nontender, no hepatomegaly  Ext: no edema Musculoskeletal:  No deformities, BUE and BLE strength normal and equal Skin: warm and dry  Neuro:  CNs 2-12 intact, no focal abnormalities noted Psych:  Normal affect   EKG:  The following EKGs  were personally reviewed and demonstrate: - EKG from 08/28/2020: Normal sinus rhythm, rate 71 bpm, with LVH, T wave inversions in III and aVF, and slight ST depression/biphasic T wave in V5-V6. - EKG from 08/31/2020 at 14:04: Normal sinus rhythm, rate 93 bpm, with PAC, LVH, and diffuse T wave inversions (II, III, aVF, V2-V6).  - EKG from 08/31/2020 at 14:11: Normal sinus rhythm, rate 95 bpm, with LVH, ST depression in leads I, II, and aVL.  Telemetry:  Telemetry was personally reviewed and demonstrates: Sinus rhythm with rates in the 70's to low 100's and occasional PAC/PVC.  Relevant CV Studies:  Echo pending.  Laboratory Data:  High Sensitivity Troponin:   Recent Labs  Lab 08/18/20 1716 08/19/20 0216 08/31/20 1450  TROPONINIHS 13 27* 117*     Chemistry Recent Labs  Lab 08/29/20 0128 08/30/20 0502 08/31/20 0135  NA 135 141 140  K 4.0 4.7 4.5  CL 101 104 105  CO2 _0 GLUCOSE 177* 144* 162*  BUN 75* 71* 66*  CREATININE 1.94* 1.96* 1.87*  CALCIUM 9.8 9.5 9.8  GFRNONAA 28* 28* 29*  ANIONGAP _1 Recent Labs  Lab 08/29/20 0128 08/30/20 0502 08/31/20 0135  PROT 6.2* 6.1* 6.3*  ALBUMIN 2.8* 2.9* 2.9*  AST 30 35 47*  ALT 60* 90* 118*  ALKPHOS 75 82 92  BILITOT 0.8 0.7 0.9   Hematology Recent Labs  Lab 08/29/20 0128 08/30/20 0502 08/31/20 0135  WBC 16.0* 15.5* 17.3*  RBC 4.28 4.48 4.57  HGB 10.9* 11.2* 11.2*  HCT 31.7* 34.2* 35.0*  MCV 74.1* 76.3* 76.6*  MCH 25.5* 25.0* 24.5*  MCHC 34.4 32.7 32.0  RDW 14.6 15.0 15.5  PLT 146* 126* 128*   BNPNo results for input(s): BNP, PROBNP in the last 168 hours.  DDimer  Recent Labs  Lab 08/29/20 0128 08/30/20 0502 08/31/20 0135  DDIMER 1.07* 1.27* 0.88*     Radiology/Studies:  No results found.   Assessment and Plan:   Chest Pain - Patient admitted with acute hypoxic respiratory failure secondary to COVID pneumonia on 08/18/2020. Developed left sided chest pain today. - First EKG today showed  normal sinus rhythm with diffuse T wave inversions. Repeat showed resolution of T wave inversions but ST depression in I, II, and aVL and ST elevations in aVR. - High-sensitivity troponin initially 10 >> 27 on admission. Repeat troponin from today after chest pain pending. - Echo pending. - IV Heparin has been started. - Continue Aspirin and high-intensity statin. No beta-blocker at this time given 4.25 second pause earlier this admission. Will likely continue to medically treat given COVID and recent AKI with history of renal transplant. Consider ischemic evaluation when she recovers from [place].  Sinus Pause - Patient had sinus pause of 4.25 on 1/20 while sleeping. - Beta-blocker stopped.  - No pauses noted in the last 24 hours. - Can get outpatient monitor. Continue to monitor on telemetry while here.  Hypertension - BP mildly elevated. - Continue Amlodipine 36m daily and Hydralazine 233mTID.  Hyperlipidemia - Continue Lipitor 4036maily.  Otherwise, per primary team: - Acute hypoxic respiratory failure secondary to COVID pneumonia.  - Acute on CKD stage IV s/p renal transplant -  UTI - DKA - Type 2 diabetes  - Hypothyroidism    Risk Assessment/Risk Scores:   TIMI Risk Score for Unstable Angina or Non-ST Elevation MI:   The patient's TIMI risk score is 5, which indicates a 26% risk of all cause mortality, new or recurrent myocardial infarction or need for urgent revascularization in the next 14 days.{  For questions or updates, please contact [place] Please consult [email or web address] for contact info under    Signed, Eppie Gibson  08/31/2020 3:53 PM   Patient seen and discussed with PA Sarajane Jews, I agree with her documentation. 65 yo female history of prior renal transplant on immunosuppressants, DM2, admitted with COVID 19 pneumonia with SOB, body aches, fatigue on Jan 11. Managed by primary team this admission,as well as AKI on CKD of transplanted kidney  followed by nephrology. High O2 demand inititally, has improved signifcantly over admission.   Sinus pause on Jan 20 whle sleeping 4.25 sec, beta blocker stopped.    Cardiology consulted today due to chest pain and EKG changes   K 4.5 Cr 1.87 GFR 29 WBC 17.3 Hgb 11.2 Plt 128 Ddimer 0.88  Jan 16 CT chest: moderate bilateral airspace disease Echo pending EKG SR, diffuse ST depressions, aVR elevation   COVID patient with chest pain symptoms. Prior EKGs have had some intermittent ST/T changes, EKGs today with diffuse ST depressions and some aVR elevation suggesting possible global ischemia. Trops and echo are pending. Factors affecting management include AKI on CKD of transplanted kidney with GFR29, COVID+with ongoing symptoms and hypoxia.   Findings and history concerning for unstable angina, perhaps NSTEMI once we get more data back.  Medical therapy with ASA 81, atorva 40, hep gtt. Had sinus pause 4 sec on beta blocker, avoiding use. F/u trop trend and echo. Ideally would medically  manage initially given the renal failure and COVID + status still symptomatic and hypoxic, perhaps cath when these medical issues have further improved. If severe refractory symptoms or high risk findings may have to consider cath earlier. Currently chest pain has resolved.      Carlyle Dolly MD

## 2020-09-01 ENCOUNTER — Other Ambulatory Visit (HOSPITAL_COMMUNITY): Payer: HMO

## 2020-09-01 DIAGNOSIS — R072 Precordial pain: Secondary | ICD-10-CM

## 2020-09-01 DIAGNOSIS — I10 Essential (primary) hypertension: Secondary | ICD-10-CM | POA: Diagnosis not present

## 2020-09-01 DIAGNOSIS — R778 Other specified abnormalities of plasma proteins: Secondary | ICD-10-CM | POA: Diagnosis not present

## 2020-09-01 DIAGNOSIS — U071 COVID-19: Secondary | ICD-10-CM | POA: Diagnosis not present

## 2020-09-01 DIAGNOSIS — N179 Acute kidney failure, unspecified: Secondary | ICD-10-CM | POA: Diagnosis not present

## 2020-09-01 DIAGNOSIS — E875 Hyperkalemia: Secondary | ICD-10-CM | POA: Diagnosis not present

## 2020-09-01 LAB — GLUCOSE, CAPILLARY
Glucose-Capillary: 108 mg/dL — ABNORMAL HIGH (ref 70–99)
Glucose-Capillary: 209 mg/dL — ABNORMAL HIGH (ref 70–99)
Glucose-Capillary: 302 mg/dL — ABNORMAL HIGH (ref 70–99)
Glucose-Capillary: 286 mg/dL — ABNORMAL HIGH (ref 70–99)

## 2020-09-01 LAB — C-REACTIVE PROTEIN: CRP: 0.6 mg/dL (ref <1.0)

## 2020-09-01 LAB — COMPREHENSIVE METABOLIC PANEL
ALT: 102 U/L — ABNORMAL HIGH (ref 0–44)
AST: 40 U/L (ref 15–41)
Albumin: 2.8 g/dL — ABNORMAL LOW (ref 3.5–5.0)
Alkaline Phosphatase: 74 U/L (ref 38–126)
Anion gap: 10 (ref 5–15)
BUN: 59 mg/dL — ABNORMAL HIGH (ref 8–23)
CO2: 22 mmol/L (ref 22–32)
Calcium: 9.7 mg/dL (ref 8.9–10.3)
Chloride: 108 mmol/L (ref 98–111)
Creatinine, Ser: 1.96 mg/dL — ABNORMAL HIGH (ref 0.44–1.00)
GFR, Estimated: 28 mL/min — ABNORMAL LOW (ref >60)
Glucose, Bld: 222 mg/dL — ABNORMAL HIGH (ref 70–99)
Potassium: 4.8 mmol/L (ref 3.5–5.1)
Sodium: 140 mmol/L (ref 135–145)
Total Bilirubin: 0.5 mg/dL (ref 0.3–1.2)
Total Protein: 6 g/dL — ABNORMAL LOW (ref 6.5–8.1)

## 2020-09-01 LAB — CBC
HCT: 32.6 % — ABNORMAL LOW (ref 36.0–46.0)
Hemoglobin: 10.9 g/dL — ABNORMAL LOW (ref 12.0–15.0)
MCH: 25.6 pg — ABNORMAL LOW (ref 26.0–34.0)
MCHC: 33.4 g/dL (ref 30.0–36.0)
MCV: 76.7 fL — ABNORMAL LOW (ref 80.0–100.0)
Platelets: 107 10*3/uL — ABNORMAL LOW (ref 150–400)
RBC: 4.25 MIL/uL (ref 3.87–5.11)
RDW: 16.2 % — ABNORMAL HIGH (ref 11.5–15.5)
WBC: 23 10*3/uL — ABNORMAL HIGH (ref 4.0–10.5)
nRBC: 0 % (ref 0.0–0.2)

## 2020-09-01 LAB — HEPARIN LEVEL (UNFRACTIONATED)
Heparin Unfractionated: 1.56 IU/mL — ABNORMAL HIGH (ref 0.30–0.70)
Heparin Unfractionated: 2.04 IU/mL — ABNORMAL HIGH (ref 0.30–0.70)
Heparin Unfractionated: 1 IU/mL — ABNORMAL HIGH (ref 0.30–0.70)

## 2020-09-01 LAB — D-DIMER, QUANTITATIVE: D-Dimer, Quant: 0.53 ug/mL-FEU — ABNORMAL HIGH (ref 0.00–0.50)

## 2020-09-01 MED ORDER — HEPARIN (PORCINE) 25000 UT/250ML-% IV SOLN
400.0000 [IU]/h | INTRAVENOUS | Status: DC
  Administered 2020-09-01: 500 [IU]/h via INTRAVENOUS
  Filled 2020-09-01: qty 250

## 2020-09-01 MED ORDER — HEPARIN (PORCINE) 25000 UT/250ML-% IV SOLN
600.0000 [IU]/h | INTRAVENOUS | Status: DC
  Administered 2020-09-01: 600 [IU]/h via INTRAVENOUS
  Filled 2020-09-01: qty 250

## 2020-09-01 NOTE — Progress Notes (Addendum)
Progress Note  Patient Name: [name] [name] Date of Encounter: 09/01/2020  Primary Cardiologist: New this admission, will likely be Dr. Harrell Gave since Dr. Harl Bowie covers [facility]  Subjective   Spoke with patient by phone - feeling somewhat better this AM, breathing stable, no chest pain today. By vitals, still requiring HFNC.  Inpatient Medications    Scheduled Meds: . amLODipine  10 mg Oral Daily  . vitamin C  500 mg Oral Daily  . aspirin EC  81 mg Oral Daily  . atorvastatin  40 mg Oral Daily  . baricitinib  1 mg Oral Daily  . feeding supplement (NEPRO CARB STEADY)  237 mL Oral BID BM  . fluticasone  1 spray Each Nare Daily  . hydrALAZINE  25 mg Oral Q8H  . insulin aspart  0-15 Units Subcutaneous TID WC  . insulin aspart  6 Units Subcutaneous TID WC  . insulin detemir  18 Units Subcutaneous BID  . levothyroxine  50 mcg Oral Q0600  . mouth rinse  15 mL Mouth Rinse BID  . nitroGLYCERIN  0.5 inch Topical Q6H  . pantoprazole  40 mg Oral Daily  . predniSONE  40 mg Oral Q breakfast  . sodium bicarbonate  650 mg Oral TID  . tacrolimus  3 mg Oral QHS  . tacrolimus  4 mg Oral Daily  . zinc sulfate  220 mg Oral Daily   Continuous Infusions: . sodium chloride    . heparin 800 Units/hr (09/01/20 0700)  . sodium chloride Stopped (08/31/20 1545)   PRN Meds: sodium chloride, acetaminophen **OR** acetaminophen, guaiFENesin, hydrALAZINE, labetalol, loperamide, morphine injection, nitroGLYCERIN, ondansetron (ZOFRAN) IV, polyethylene glycol, sodium chloride   Vital Signs    Vitals:   09/01/20 0400 09/01/20 0630 09/01/20 0802 09/01/20 0845  BP: 118/60 103/63 122/69   Pulse: 89 92 100   Resp: 20 (!) 22 20   Temp:   97.7 F (36.5 C)   TempSrc:   Oral   SpO2: 97% 96% 91% 92%  Weight:      Height:        Intake/Output Summary (Last 24 hours) at 09/01/2020 0957 Last data filed at 09/01/2020 0700 Gross per 24 hour  Intake 1490.38 ml  Output -  Net 1490.38 ml    Last 3 Weights 09/01/2020 08/31/2020 08/31/2020  Weight (lbs) 193 lb 12.6 oz 190 lb 0.6 oz 190 lb 0.6 oz  Weight (kg) 87.9 kg 86.2 kg 86.2 kg     Telemetry    NSR - Personally Reviewed  ECG    Yesterday NSR 95bpm LVH with repolarization changes/STD/TWI in I, II, avL and subtle ST sagging in avF, V3-V6 - Personally Reviewed  Physical Exam   VS reviewed. General - calm F in no acute distress, some hoarseness Pulm - No labored breathing, no coughing during visit, no audible wheezing, speaking in full sentences Neuro - A+Ox3, no slurred speech, answers questions appropriately Psych - Pleasant affect   Labs    High Sensitivity Troponin:   Recent Labs  Lab 08/18/20 1716 08/19/20 0216 08/31/20 1450 08/31/20 1815 08/31/20 2212  TROPONINIHS 13 27* 117* 123* 140*      Cardiac EnzymesNo results for input(s): TROPONINI in the last 168 hours. No results for input(s): TROPIPOC in the last 168 hours.   Chemistry Recent Labs  Lab 08/30/20 0502 08/31/20 0135 09/01/20 0022  NA 141 140 140  K 4.7 4.5 4.8  CL 104 105 108  CO2 25 25 22   GLUCOSE 144* 162*  222*  BUN 71* 66* 59*  CREATININE 1.96* 1.87* 1.96*  CALCIUM 9.5 9.8 9.7  PROT 6.1* 6.3* 6.0*  ALBUMIN 2.9* 2.9* 2.8*  AST 35 47* 40  ALT 90* 118* 102*  ALKPHOS 82 92 74  BILITOT 0.7 0.9 0.5  GFRNONAA 28* 29* 28*  ANIONGAP 12 10 10      Hematology Recent Labs  Lab 08/30/20 0502 08/31/20 0135 09/01/20 0022  WBC 15.5* 17.3* 23.0*  RBC 4.48 4.57 4.25  HGB 11.2* 11.2* 10.9*  HCT 34.2* 35.0* 32.6*  MCV 76.3* 76.6* 76.7*  MCH 25.0* 24.5* 25.6*  MCHC 32.7 32.0 33.4  RDW 15.0 15.5 16.2*  PLT 126* 128* 107*    BNP Recent Labs  Lab 08/31/20 2212  BNP 235.3*     DDimer  Recent Labs  Lab 08/30/20 0502 08/31/20 0135 09/01/20 0022  DDIMER 1.27* 0.88* 0.53*     Radiology    DG CHEST PORT 1 VIEW  Result Date: 08/31/2020 CLINICAL DATA:  65 year old female with shortness of breath. EXAM: PORTABLE CHEST 1  VIEW COMPARISON:  Chest radiograph dated 08/18/2020. FINDINGS: Bilateral confluent and reticular densities, progressed since the prior radiograph and most consistent with multifocal pneumonia. No pleural effusion pneumothorax. Borderline cardiomegaly. No acute osseous pathology. IMPRESSION: Interval progression of bilateral pulmonary opacities compared to the radiograph of 08/18/2020. Continued follow-up recommended. Electronically Signed   By: Anner Crete M.D.   On: 08/31/2020 21:17    Cardiac Studies   Pending  Patient Profile     65 y.o. vaccinated (+boosted) female with CKD stage IV with prior history of renal transplant x 2 most recently in 2016 on chronic immunosuppressant therapy, HTN, HLD, DM2, hypothyroidism, gout, sickle cell trait developed symptoms concerning for Covid on 08/06/21. She tested positive for Covid officially on 08/12/20 (initial test before that on 12/31 was inconclusive). Seen by primary care and prescribed Z-pak with OP referral to [place] but upon their evaluation, too late in course for symptoms (day 10) and sounded very ill so referred to the ED. Admitted for acute hypoxic respiratory failure due to Covid-19 PNA. On 1/11, transferred to ICU for worsening hypoxemia, AKI (Cr peak 7.12), metabolic acidosis, and hyperkalemia. Other issues include complicated UTI, DKA, sinus pause (4.2sec on 1/20 while sleeping requiring cessation of BB), and chest pain on 08/31/20 with troponin elevation to 140.   Assessment & Plan    1. Acute hypoxic respiratory failure secondary to Covid-19 PNA - management per primary team, on steroid taper, baricitinib, continues to have O2 requirement  2. Chest pain with elevated troponin/possible NSTEMI - 2D echo pending - aspirin, statin, heparin, NTG ointment on board - no BB due to #3 - troponin, EKG and history felt concerning for [place] / possibly NSTEMI - difficult to know in context of multiple medical stressors. Given her recent renal failure  and Covid + status still symptomatic and hypoxic, medical therapy recommended for now with an eye towards possible cath when improved (sooner if symptoms escalate - currently stable)  3. Sinus pause - no recurrence, could be either related to baseline nocturnal bradycardia or disruption in AV conduction as seen in some Covid cases - TSH suppressed at 0.114 -> will repeat with fT4 in AM  4. Essential HTN - BP controlled on present regimen which includes amlodipine, hyralazine, NTG ointment   5. Hyperlipidemia - simvastatin changed to atorvastatin this admission  6. Acute on CKD stage IV s/p renal transplant x2  -prior OP Cr values were 1.6-2.26, remaining steady  around 1.9 range now following initial peak of 3.99  7. Otherwise, per primary team/nephrology - UTI, DKA, hypothyroidism   For questions or updates, please contact [place] Please consult [email or web address] for contact info under Cardiology/STEMI.  Signed, Charlie Pitter, PA-C 09/01/2020, 9:57 AM

## 2020-09-01 NOTE — Progress Notes (Signed)
[place] for IV heparin Indication: chest pain/ACS   Labs: Recent Labs    08/30/20 0502 08/31/20 0135 08/31/20 1450 08/31/20 1815 08/31/20 2212 09/01/20 0022 09/01/20 1045  HGB 11.2* 11.2*  --   --   --  10.9*  --   HCT 34.2* 35.0*  --   --   --  32.6*  --   PLT 126* 128*  --   --   --  107*  --   HEPARINUNFRC  --   --   --   --   --  1.56* 2.04*  CREATININE 1.96* 1.87*  --   --   --  1.96*  --   TROPONINIHS  --   --  117* 123* 140*  --   --      Assessment: 65 yo female admitted with COVID-19 PNA, hx renal transplant, now with chest pain.  Received SQ heparin 7500 units TID for prophylaxis - last dose given at 1330 PM.  Pharmacy asked to start IV heparin for r/o ACS.  CBC stable, large ecchymoses note per MD from previous injections.  Initial heparin level 1.56 units/hr with follow-up 2.04.  Lab unable to draw from opposite arm, therefore high levels likely due to being drawn too close to the heparin infusion. Patient is a difficult lab draw per RN. Have discussed with primary MD and Cardiology to suggest possible change to lovenox for ACS. Decision per cardiology pending ECHO.   Goal of Therapy:  Heparin level 0.3-0.7 units/ml Monitor platelets by anticoagulation protocol: Yes   Plan:  Hold heparin for 1 hour Decrease heparin drip to 600 units/hr Check heparin level 6-8 hrs after rate change Daily heparin level and CBC. F/u plans for [facility]  Fionnuala Hemmerich A. Levada Dy, PharmD, BCPS, FNKF Clinical Pharmacist [place] Please utilize Amion for appropriate phone number to reach the unit pharmacist ([place])

## 2020-09-01 NOTE — Progress Notes (Signed)
PROGRESS NOTE                                                                                                                                                                                                             Patient Demographics:    [name] [name], is a 65 y.o. female, DOB - March 30, 1956, FAO:[id number]  Outpatient Primary MD for the patient is Glendale Chard, MD   Admit date - 08/18/2020   LOS - 21  Chief Complaint  Patient presents with  . Covid Positive  . Chest Pain  . Weakness       Brief Narrative: Patient is a 65 y.o. female with PMHx of renal transplant x2 (latest in 2016) on chronic immunosuppressive agents, DM-2, hypothyroidism-who presented with shortness of breath-found to have acute hypoxic respiratory failure due to COVID-19 pneumonia.  Hospital course complicated by slow improvement from severe hypoxemia, AKI and non-STEMI.  COVID-19 vaccinated status: Vaccinated including booster  Significant Events: 1/10>> Admit to [facility] for acute hypoxic respiratory failure due to COVID-19 pneumonia 1/11>> transfer to ICU due to worsening hypoxemia, AKI/metabolic acidosis/hyperkalemia. 1/14>> transferred to [facility] 1/23>> chest pain-non-STEMI-cardiology consult  Significant studies: 1/10>> chest x-ray: Severe multilobar bilateral pneumonia. 1/11>> ultrasound renal transplant: No hydronephrosis 1/16>> CT chest: Multifocal viral pneumonia   COVID-19 medications: Steroids: 1/10>> Remdesivir: 1/10>>1/15 Baricitinib: 1/16>>  Antibiotics: Rocephin: 1/12>> 1/18  Microbiology data: 1/10 >>blood culture: No growth 1/11>> urine culture: Klebsiella pneumoniae  Procedures: None  Consults: PCCM, nephrology, cardiology  DVT prophylaxis: SCDs Start: 08/19/20 0933    Subjective:   Stable at rest-requiring around 3-5 L of oxygen-Per nursing staff-requires significantly more oxygen with ambulation.    Assessment  & Plan :   Acute Hypoxic Resp Failure due to Covid 19 Viral pneumonia: Had severe hypoxemia on initial presentation-was on heated high flow-but has been gradually improving-Down to 3-4 L of oxygen at rest-but requires significantly more with ambulation.  Volume status stable-continue steroids/baricitinib-continue to attempt to slowly titrate down FiO2.    Fever: afebrile O2 requirements:  SpO2: 92 % O2 Flow Rate (L/min): 5 L/min FiO2 (%): 40 %   COVID-19 Labs: Recent Labs    08/30/20 0502 08/31/20 0135 09/01/20 0022  DDIMER 1.27* 0.88* 0.53*  CRP 0.5 0.6 0.6       Component Value Date/Time   BNP 235.3 (H) 08/31/2020 2212  Recent Labs  Lab 08/26/20 0111  PROCALCITON <0.10    Lab Results  Component Value Date   [place] NEGATIVE 04/27/2020   [place] Not Detected 01/30/2019     Prone/Incentive Spirometry: encouraged  incentive spirometry use 3-4/hour.  Sinus pause: Occurred on 1/20-approximately 4.25 seconds-this occurred while she was sleeping-after discussion with cardiology-beta-blocker discontinued.  Needs outpatient polysomnography.  Non-STEMI: Developed severe chest pain for approximately 30-45 minutes on 1/23-significant EKG changes with diffuse T wave inversions-that subsequently resolved after sublingual nitrogen and other supportive care.  Currently off aspirin/statin/IV heparin-cardiology following.  Await echo.   AKI on CKD stage IV-transplanted kidney: AKI hemodynamically mediated-creatinine improved but seems to have plateaued lately.  Nephrology following and directing care.   History of renal transplant (second renal transplant-01/2015): Remains on Prograf-on steroids-Myfortic on hold.  Nephrology has signed off.  Hyperkalemia/anion gap metabolic acidosis: Secondary to AKI-has resolved.  Complicated UTI: Completed a course of Rocephin.  Afebrile and nontoxic-appearing.  DKA: Resolved-no longer on insulin drip.  DM-2 (A1c 8.2 on  1/11) with uncontrolled hyperglycemia due to steroids: CBG stable-continue Levemir 18 units twice daily, 6 units of NovoLog with meals and SSI.  Follow and adjust.    Recent Labs    08/31/20 1704 08/31/20 2038 09/01/20 0801  GLUCAP 255* 269* 108*    Hypothyroidism: Continue levothyroxine-given suppressed TSH-I have decreased dosage to 50 mcg-repeat TSH in 3 months.  HTN: BP stable-continue amlodipine, Lopressor and hydralazine.    HLD: Continue statin  Obesity: Estimated body mass index is 35.44 kg/m as calculated from the following:   Height as of this encounter: 5\' 2"  (1.575 m).   Weight as of this encounter: 87.9 kg.   RN pressure injury documentation: Pressure Injury 08/20/20 Coccyx Mid Stage 1 -  Intact skin with non-blanchable redness of a localized area usually over a bony prominence. (Active)  08/20/20 0800  Location: Coccyx  Location Orientation: Mid  Staging: Stage 1 -  Intact skin with non-blanchable redness of a localized area usually over a bony prominence.  Wound Description (Comments):   Present on Admission: Yes    GI prophylaxis: PPI  ABG:    Component Value Date/Time   PHART 7.266 (L) 08/19/2020 0808   PCO2ART 32.3 08/19/2020 0808   PO2ART 186 (H) 08/19/2020 0808   HCO3 19.0 (L) 08/20/2020 1101   TCO2 16 (L) 08/19/2020 0808   ACIDBASEDEF 6.4 (H) 08/20/2020 1101   O2SAT 85.1 08/20/2020 1101    Vent Settings: N/A    Condition - Extremely Guarded  Family Communication  :  Daughter Mendel Ryder [phone] updated on 1/24  Code Status :  Full Code  Diet :  Diet Order            Diet Carb Modified Fluid consistency: Thin; Room service appropriate? Yes  Diet effective now                  Disposition Plan  :   Status is: Inpatient  Remains inpatient appropriate because:Inpatient level of care appropriate due to severity of illness   Dispo: The patient is from: Home              Anticipated d/c is to: SNF              Anticipated d/c  date is: > 3 days              Patient currently is not medically stable to d/c.   Barriers to discharge: Hypoxia requiring O2 supplementation  Antimicorbials  :    Anti-infectives (From admission, onward)   Start     Dose/Rate Route Frequency Ordered Stop   08/20/20 1445  cefTRIAXone (ROCEPHIN) 1 g in sodium chloride 0.9 % 100 mL IVPB        1 g 200 mL/hr over 30 Minutes Intravenous Every 24 hours 08/20/20 1348 08/26/20 2147   08/20/20 1000  remdesivir 100 mg in sodium chloride 0.9 % 100 mL IVPB       "Followed by" Linked Group Details   100 mg 200 mL/hr over 30 Minutes Intravenous Daily 08/19/20 0021 08/23/20 1113   08/19/20 0130  remdesivir 200 mg in sodium chloride 0.9% 250 mL IVPB       "Followed by" Linked Group Details   200 mg 580 mL/hr over 30 Minutes Intravenous Once 08/19/20 0021 08/19/20 0314      Inpatient Medications  Scheduled Meds: . amLODipine  10 mg Oral Daily  . vitamin C  500 mg Oral Daily  . aspirin EC  81 mg Oral Daily  . atorvastatin  40 mg Oral Daily  . baricitinib  1 mg Oral Daily  . feeding supplement (NEPRO CARB STEADY)  237 mL Oral BID BM  . fluticasone  1 spray Each Nare Daily  . hydrALAZINE  25 mg Oral Q8H  . insulin aspart  0-15 Units Subcutaneous TID WC  . insulin aspart  6 Units Subcutaneous TID WC  . insulin detemir  18 Units Subcutaneous BID  . levothyroxine  50 mcg Oral Q0600  . mouth rinse  15 mL Mouth Rinse BID  . nitroGLYCERIN  0.5 inch Topical Q6H  . pantoprazole  40 mg Oral Daily  . predniSONE  40 mg Oral Q breakfast  . sodium bicarbonate  650 mg Oral TID  . tacrolimus  3 mg Oral QHS  . tacrolimus  4 mg Oral Daily  . zinc sulfate  220 mg Oral Daily   Continuous Infusions: . sodium chloride    . heparin 800 Units/hr (09/01/20 0700)  . sodium chloride Stopped (08/31/20 1545)   PRN Meds:.sodium chloride, acetaminophen **OR** acetaminophen, guaiFENesin, hydrALAZINE, labetalol, loperamide, morphine injection, nitroGLYCERIN,  ondansetron (ZOFRAN) IV, polyethylene glycol, sodium chloride   Time Spent in minutes  25    See all Orders from today for further details   Oren Binet M.D on 09/01/2020 at 11:36 AM  To page go to [email or web address] - use universal password  [facility] -  Office  [phone]    Objective:   Vitals:   09/01/20 0400 09/01/20 0630 09/01/20 0802 09/01/20 0845  BP: 118/60 103/63 122/69   Pulse: 89 92 100   Resp: 20 (!) 22 20   Temp:   97.7 F (36.5 C)   TempSrc:   Oral   SpO2: 97% 96% 91% 92%  Weight:      Height:        Wt Readings from Last 3 Encounters:  09/01/20 87.9 kg  05/14/20 92.5 kg  05/14/20 92.9 kg     Intake/Output Summary (Last 24 hours) at 09/01/2020 1136 Last data filed at 09/01/2020 1010 Gross per 24 hour  Intake 1730.38 ml  Output --  Net 1730.38 ml     Physical Exam Gen Exam:Alert awake-not in any distress HEENT:atraumatic, normocephalic Chest: B/L clear to auscultation anteriorly CVS:S1S2 regular Abdomen:soft non tender, non distended Extremities:no edema Neurology: Non focal Skin: no rash   Data Review:    CBC Recent Labs  Lab 08/26/20 0111 08/28/20 0551 08/29/20 0128  08/30/20 0502 08/31/20 0135 09/01/20 0022  WBC 10.1 11.8* 16.0* 15.5* 17.3* 23.0*  HGB 10.4* 10.8* 10.9* 11.2* 11.2* 10.9*  HCT 31.0* 33.0* 31.7* 34.2* 35.0* 32.6*  PLT 132* 138* 146* 126* 128* 107*  MCV 74.9* 75.0* 74.1* 76.3* 76.6* 76.7*  MCH 25.1* 24.5* 25.5* 25.0* 24.5* 25.6*  MCHC 33.5 32.7 34.4 32.7 32.0 33.4  RDW 14.1 14.2 14.6 15.0 15.5 16.2*  LYMPHSABS 0.4* 0.4* 0.4*  --   --   --   MONOABS 0.5 0.5 0.8  --   --   --   EOSABS 0.0 0.0 0.0  --   --   --   BASOSABS 0.0 0.0 0.1  --   --   --     Chemistries  Recent Labs  Lab 08/26/20 0526 08/28/20 0551 08/29/20 0128 08/30/20 0502 08/31/20 0135 09/01/20 0022  NA  --  138 135 141 140 140  K  --  4.1 4.0 4.7 4.5 4.8  CL  --  101 101 104 105 108  CO2  --  25 24 25 25 22   GLUCOSE  --  208*  177* 144* 162* 222*  BUN  --  67* 75* 71* 66* 59*  CREATININE  --  1.91* 1.94* 1.96* 1.87* 1.96*  CALCIUM  --  9.9 9.8 9.5 9.8 9.7  MG 2.1  --   --   --   --   --   AST  --  18 30 35 47* 40  ALT  --  24 60* 90* 118* 102*  ALKPHOS  --  66 75 82 92 74  BILITOT  --  0.8 0.8 0.7 0.9 0.5   ------------------------------------------------------------------------------------------------------------------ No results for input(s): CHOL, HDL, LDLCALC, TRIG, CHOLHDL, LDLDIRECT in the last 72 hours.  Lab Results  Component Value Date   HGBA1C 8.2 (H) 08/19/2020   ------------------------------------------------------------------------------------------------------------------ No results for input(s): TSH, T4TOTAL, T3FREE, THYROIDAB in the last 72 hours.  Invalid input(s): FREET3 ------------------------------------------------------------------------------------------------------------------ No results for input(s): VITAMINB12, FOLATE, FERRITIN, TIBC, IRON, RETICCTPCT in the last 72 hours.  Coagulation profile No results for input(s): INR, PROTIME in the last 168 hours.  Recent Labs    08/31/20 0135 09/01/20 0022  DDIMER 0.88* 0.53*    Cardiac Enzymes No results for input(s): CKMB, TROPONINI, MYOGLOBIN in the last 168 hours.  Invalid input(s): CK ------------------------------------------------------------------------------------------------------------------    Component Value Date/Time   BNP 235.3 (H) 08/31/2020 2212    Micro Results No results found for this or any previous visit (from the past 240 hour(s)).  Radiology Reports CT CHEST WO CONTRAST  Result Date: 08/24/2020 CLINICAL DATA:  Severe hypoxia and respiratory failure with COVID-19 infection. EXAM: CT CHEST WITHOUT CONTRAST TECHNIQUE: Multidetector CT imaging of the chest was performed following the standard protocol without IV contrast. COMPARISON:  Chest x-ray 08/18/2020 FINDINGS: Cardiovascular: Mild cardiomegaly.  Mild calcification over the mitral valve annulus. Thoracic aorta is normal caliber. Stent over the right subclavian/axillary region. Remaining vascular structures are unremarkable. Mediastinum/Nodes: No evidence of mediastinal or hilar adenopathy. Remaining mediastinal structures are unremarkable. Lungs/Pleura: Lungs are adequately inflated demonstrate moderate bilateral hazy airspace process likely multifocal viral pneumonia in this COVID-19 positive patient. No evidence of effusion. Airways are otherwise unremarkable. Upper Abdomen: No acute findings. Musculoskeletal: Degenerative change of the spine. Mild anterior wedging of a midthoracic vertebral body likely chronic. IMPRESSION: 1. Moderate bilateral hazy airspace process likely multifocal viral pneumonia in this COVID-19 positive patient. 2. Mild cardiomegaly. 3. Mild anterior wedging of a midthoracic vertebral  body likely chronic. Electronically Signed   By: Marin Olp M.D.   On: 08/24/2020 12:37   US Renal Transplant w/Doppler  Result Date: 08/19/2020 CLINICAL DATA:  Acute kidney injury EXAM: ULTRASOUND OF RENAL TRANSPLANT WITH RENAL DOPPLER ULTRASOUND TECHNIQUE: Ultrasound examination of the renal transplant was performed with gray-scale, color and duplex doppler evaluation. COMPARISON:  None. FINDINGS: Transplant kidney location: Left lower quadrant Transplant Kidney: Renal measurements: 10.8 x 6.2 x 6.5 cm = volume: 271mL. Normal in size and parenchymal echogenicity. No evidence of mass or hydronephrosis. No peri-transplant fluid collection seen. Color flow in the main renal artery:  Yes Color flow in the main renal vein:  Yes Duplex Doppler Evaluation: Main Renal Artery Velocity:  cm/sec Main Renal Artery Resistive Index: 0.99 Venous waveform in main renal vein:  Present Intrarenal resistive index in upper pole:  0.98 (normal 0.6-0.8; equivocal 0.8-0.9; abnormal >= 0.9) Intrarenal resistive index in lower pole: 0.98 (normal 0.6-0.8; equivocal  0.8-0.9; abnormal >= 0.9) Bladder: Normal for degree of bladder distention. Other findings:  None. IMPRESSION: Elevated resistive indices within the left lower quadrant transplant kidney. No hydronephrosis. Electronically Signed   By: Rolm Baptise M.D.   On: 08/19/2020 19:15   DG CHEST PORT 1 VIEW  Result Date: 08/31/2020 CLINICAL DATA:  65 year old female with shortness of breath. EXAM: PORTABLE CHEST 1 VIEW COMPARISON:  Chest radiograph dated 08/18/2020. FINDINGS: Bilateral confluent and reticular densities, progressed since the prior radiograph and most consistent with multifocal pneumonia. No pleural effusion pneumothorax. Borderline cardiomegaly. No acute osseous pathology. IMPRESSION: Interval progression of bilateral pulmonary opacities compared to the radiograph of 08/18/2020. Continued follow-up recommended. Electronically Signed   By: Anner Crete M.D.   On: 08/31/2020 21:17   DG Chest Portable 1 View  Result Date: 08/18/2020 CLINICAL DATA:  65 year old female with history of COVID infection. Chest pain. Shortness of breath. EXAM: PORTABLE CHEST 1 VIEW COMPARISON:  Chest x-ray 10/20/2014. FINDINGS: Patchy multifocal airspace consolidation and areas of interstitial prominence are noted throughout the lungs bilaterally, most severe throughout the periphery of the mid to lower left lung, indicative of multilobar bilateral pneumonia. No definite pleural effusions. No evidence of pulmonary edema. No pneumothorax. Heart size is borderline enlarged. Upper mediastinal contours are within normal limits. Atherosclerotic calcifications in the thoracic aorta. Vascular stent projecting over the right subclavian region. IMPRESSION: 1. Severe multilobar bilateral (left greater than right) pneumonia compatible with reported COVID infection. 2. Aortic atherosclerosis. Electronically Signed   By: Vinnie Langton M.D.   On: 08/18/2020 18:02

## 2020-09-01 NOTE — Progress Notes (Signed)
Physical Therapy Treatment Patient Details Name: [name] [name] MRN: [id number] DOB: 21-Feb-1956 Today's Date: 09/01/2020    History of Present Illness Pt is a 65 y.o. female who tested (+) COVID-19 on 08/13/20, now admitted 08/18/20 with worsening SOB, cough, chest pain and body aches. Workup for acute hypoxic respiratory failure due to COVID-19 PNA, DKA, AKI on CKD, UTI. PMH includes HTN, DM2, chronic steroid use, s/p renal transplant x2.Episode of CP 1/22; per Cardiology: troponin, EKG and history felt concerning for [place] / possibly NSTEMI - difficult to know in context of multiple medical stressors    PT Comments    Continuing work on functional mobility and activity tolerance;  Notable that pt was able to get EOB and perform bed to chair transfer with min assist and considerably less need for supplemental O2 in comparison to last PT session; stood from bed with min assist to steady; took pivotal steps to recliner; O2 sats down to 77% (observed lowest) on 11 L HFNC; required over 10 minutes of seated rest with focused breathing to recover back to low 90s, but able to turn O2 back down to 7L HFNC  Follow Up Recommendations  SNF     Equipment Recommendations  Rolling walker with 5" wheels;3in1 (PT)    Recommendations for Other Services       Precautions / Restrictions Precautions Precaution Comments: Watch SpO2 - quick to desaturate on HHFNC    Mobility  Bed Mobility Overal bed mobility: Needs Assistance Bed Mobility: Supine to Sit     Supine to sit: Supervision;HOB elevated     General bed mobility comments: Increased time and effort with use of bed rail; O2 sats down to 86% on 11L HFNC  Transfers Overall transfer level: Needs assistance Equipment used: 1 person hand held assist Transfers: Sit to/from [facility] Sit to Stand: Min assist Stand pivot transfers: Min guard       General transfer comment: stood from bed with min assist to steady; took  pivotal steps to recliner; O2 sats down to 77% (observed lowest) on 11 L HFNC; required over 10 minutes of seated rest with focused breathing to recover back to low 90s  Ambulation/Gait                 Stairs             Wheelchair Mobility    Modified Rankin (Stroke Patients Only)       Balance                                            Cognition Arousal/Alertness: Awake/alert Behavior During Therapy: WFL for tasks assessed/performed;Flat affect Overall Cognitive Status: Within Functional Limits for tasks assessed                                        Exercises      General Comments General comments (skin integrity, edema, etc.): O2 sats down to 77% observed lowest on 11 L HFNC; REcovered with seated rest, focused breathing; used visualization/relaxation techniques      Pertinent Vitals/Pain Pain Assessment: No/denies pain    Home Living                      Prior Function  PT Goals (current goals can now be found in the care plan section) Acute Rehab PT Goals Patient Stated Goal: Hopeful for return home, but willing to consider post-acute rehab at SNF PT Goal Formulation: With patient Time For Goal Achievement: 09/05/20 Potential to Achieve Goals: Good Progress towards PT goals: Progressing toward goals (slowly)    Frequency    Min 2X/week      PT Plan Current plan remains appropriate    Co-evaluation              AM-PAC PT "6 Clicks" Mobility   Outcome Measure  Help needed turning from your back to your side while in a flat bed without using bedrails?: A Little Help needed moving from lying on your back to sitting on the side of a flat bed without using bedrails?: A Little Help needed moving to and from a bed to a chair (including a wheelchair)?: A Little Help needed standing up from a chair using your arms (e.g., wheelchair or bedside chair)?: A Little Help needed to walk  in hospital room?: A Lot Help needed climbing 3-5 steps with a railing? : A Lot 6 Click Score: 16    End of Session Equipment Utilized During Treatment: Oxygen Activity Tolerance: Patient limited by fatigue Patient left: in chair;with call bell/phone within reach;with chair alarm set Nurse Communication: Mobility status PT Visit Diagnosis: Other abnormalities of gait and mobility (R26.89);Muscle weakness (generalized) (M62.81)     Time: 3491-7915 PT Time Calculation (min) (ACUTE ONLY): 26 min  Charges:  $Therapeutic Activity: 23-37 mins                     Roney Marion, [place]  Acute Rehabilitation Services Pager [phone] Office [place] 09/01/2020, 7:38 PM

## 2020-09-01 NOTE — Progress Notes (Signed)
[place] for IV heparin Indication: chest pain/ACS   Labs: Recent Labs    08/30/20 0502 08/31/20 0135 08/31/20 1450 08/31/20 1815 08/31/20 2212 09/01/20 0022  HGB 11.2* 11.2*  --   --   --   --   HCT 34.2* 35.0*  --   --   --   --   PLT 126* 128*  --   --   --   --   HEPARINUNFRC  --   --   --   --   --  1.56*  CREATININE 1.96* 1.87*  --   --   --  1.96*  TROPONINIHS  --   --  117* 123* 140*  --      Assessment: 65 yo female admitted with COVID-19 PNA, hx renal transplant, now with chest pain.  Receiving SQ heparin 7500 units TID for prophylaxis - last dose given at 1330 PM.  Pharmacy asked to start IV heparin for r/o ACS.  CBC stable, no overt bleeding or complications noted. Initial heparin level 1.56 units/hr.  Not sure if level drawn correctly but she was receiving sq heparin previously.  Goal of Therapy:  Heparin level 0.3-0.7 units/ml Monitor platelets by anticoagulation protocol: Yes   Plan:  Decrease heparin to 800 units/hr Check heparin level 6-8 hrs after rate change Daily heparin level and CBC. F/u plans for further cards workup.  Thanks for allowing pharmacy to be a part of this patient's care.  Excell Seltzer, PharmD Clinical Pharmacist

## 2020-09-01 NOTE — Progress Notes (Signed)
[place] for IV heparin Indication: chest pain/ACS   Labs: Recent Labs    08/30/20 0502 08/31/20 0135 08/31/20 1450 08/31/20 1815 08/31/20 2212 09/01/20 0022 09/01/20 1045 09/01/20 2108  HGB 11.2* 11.2*  --   --   --  10.9*  --   --   HCT 34.2* 35.0*  --   --   --  32.6*  --   --   PLT 126* 128*  --   --   --  107*  --   --   HEPARINUNFRC  --   --   --   --   --  1.56* 2.04* 1.00*  CREATININE 1.96* 1.87*  --   --   --  1.96*  --   --   TROPONINIHS  --   --  117* 123* 140*  --   --   --      Assessment: 65 yo female admitted with COVID-19 PNA, hx renal transplant, now with chest pain.  Received SQ heparin 7500 units TID for prophylaxis - last dose given at 1330 PM.  Pharmacy asked to start IV heparin for r/o ACS.  CBC stable, large ecchymoses note per MD from previous injections.  Initial heparin level 1.56 units/hr with follow-up 2.04.  Lab unable to draw from opposite arm, therefore high levels likely due to being drawn too close to the heparin infusion. Patient is a difficult lab draw per RN. Have discussed with primary MD and Cardiology to suggest possible change to lovenox for ACS. Decision per cardiology, continue heparin for 48 hours.   Heparin level elevated at 1.0 units/ml  Goal of Therapy:  Heparin level 0.3-0.7 units/ml Monitor platelets by anticoagulation protocol: Yes   Plan:  Hold heparin for 30 min Decrease heparin drip to 500 units/hr Check heparin level with am labs Daily heparin level and CBC. F/u plans for [facility]  Alanda Slim, PharmD, [facility] Clinical Pharmacist Please see AMION for all Pharmacists' Contact Phone Numbers 09/01/2020, 10:39 PM

## 2020-09-01 NOTE — Progress Notes (Signed)
   08/31/20 2005  Assess: MEWS Score  Temp 98.1 F (36.7 C)  BP (!) 153/70  Pulse Rate (!) 112  ECG Heart Rate (!) 112  Resp (!) 30  Level of Consciousness Alert  SpO2 98 %  O2 Device HFNC  Patient Activity (if Appropriate) In bed  O2 Flow Rate (L/min) 5 L/min  Assess: MEWS Score  MEWS Temp 0  MEWS Systolic 0  MEWS Pulse 2  MEWS RR 2  MEWS LOC 0  MEWS Score 4  MEWS Score Color Red  Assess: if the MEWS score is Yellow or Red  Were vital signs taken at a resting state? Yes  Focused Assessment No change from prior assessment  Early Detection of Sepsis Score *See Row Information* High  MEWS guidelines implemented *See Row Information* Yes  Treat  MEWS Interventions Escalated (See documentation below)  Pain Scale 0-10  Pain Score 0  Take Vital Signs  Increase Vital Sign Frequency  Red: Q 1hr X 4 then Q 4hr X 4, if remains red, continue Q 4hrs  Escalate  MEWS: Escalate Red: discuss with charge nurse/RN and provider, consider discussing with RRT  Notify: Charge Nurse/RN  Name of Charge Nurse/RN Notified Nikki, RN  Date Charge Nurse/RN Notified 08/31/20  Time Charge Nurse/RN Notified 2120  Notify: Provider  Provider Name/Title Hal Hope, MD  Date Provider Notified 08/31/20  Time Provider Notified 2054  Notification Type Page  Notification Reason Change in status  Response See new orders  Date of Provider Response 08/31/20  Time of Provider Response 2056 (MD called.)  Document  Patient Outcome Stabilized after interventions   Patient c/o SOB after moving in bed, denies CP. Crackles auscultated to bilateral lungs, anterior and posterior. MD aware. CXR done. MD aware of results. Patient denies SOB at this time, denies CP. No distress noted at this time. Repeat BP 128/64, HR 98, Resp 23. Will continue to monitor.

## 2020-09-02 ENCOUNTER — Inpatient Hospital Stay (HOSPITAL_COMMUNITY): Payer: HMO

## 2020-09-02 DIAGNOSIS — E875 Hyperkalemia: Secondary | ICD-10-CM | POA: Diagnosis not present

## 2020-09-02 DIAGNOSIS — R079 Chest pain, unspecified: Secondary | ICD-10-CM | POA: Diagnosis not present

## 2020-09-02 DIAGNOSIS — U071 COVID-19: Secondary | ICD-10-CM | POA: Diagnosis not present

## 2020-09-02 DIAGNOSIS — I10 Essential (primary) hypertension: Secondary | ICD-10-CM | POA: Diagnosis not present

## 2020-09-02 DIAGNOSIS — N179 Acute kidney failure, unspecified: Secondary | ICD-10-CM | POA: Diagnosis not present

## 2020-09-02 DIAGNOSIS — J9601 Acute respiratory failure with hypoxia: Secondary | ICD-10-CM | POA: Diagnosis not present

## 2020-09-02 DIAGNOSIS — R072 Precordial pain: Secondary | ICD-10-CM | POA: Diagnosis not present

## 2020-09-02 LAB — CBC
HCT: 29.7 % — ABNORMAL LOW (ref 36.0–46.0)
Hemoglobin: 9.8 g/dL — ABNORMAL LOW (ref 12.0–15.0)
MCH: 25.9 pg — ABNORMAL LOW (ref 26.0–34.0)
MCHC: 33 g/dL (ref 30.0–36.0)
MCV: 78.6 fL — ABNORMAL LOW (ref 80.0–100.0)
Platelets: 115 10*3/uL — ABNORMAL LOW (ref 150–400)
RBC: 3.78 MIL/uL — ABNORMAL LOW (ref 3.87–5.11)
RDW: 16.5 % — ABNORMAL HIGH (ref 11.5–15.5)
WBC: 19.3 10*3/uL — ABNORMAL HIGH (ref 4.0–10.5)
nRBC: 0.1 % (ref 0.0–0.2)

## 2020-09-02 LAB — GLUCOSE, CAPILLARY
Glucose-Capillary: 97 mg/dL (ref 70–99)
Glucose-Capillary: 124 mg/dL — ABNORMAL HIGH (ref 70–99)
Glucose-Capillary: 272 mg/dL — ABNORMAL HIGH (ref 70–99)
Glucose-Capillary: 244 mg/dL — ABNORMAL HIGH (ref 70–99)

## 2020-09-02 LAB — COMPREHENSIVE METABOLIC PANEL
ALT: 249 U/L — ABNORMAL HIGH (ref 0–44)
AST: 97 U/L — ABNORMAL HIGH (ref 15–41)
Albumin: 2.8 g/dL — ABNORMAL LOW (ref 3.5–5.0)
Alkaline Phosphatase: 102 U/L (ref 38–126)
Anion gap: 10 (ref 5–15)
BUN: 63 mg/dL — ABNORMAL HIGH (ref 8–23)
CO2: 23 mmol/L (ref 22–32)
Calcium: 9.7 mg/dL (ref 8.9–10.3)
Chloride: 107 mmol/L (ref 98–111)
Creatinine, Ser: 1.96 mg/dL — ABNORMAL HIGH (ref 0.44–1.00)
GFR, Estimated: 28 mL/min — ABNORMAL LOW (ref >60)
Glucose, Bld: 185 mg/dL — ABNORMAL HIGH (ref 70–99)
Potassium: 5.2 mmol/L — ABNORMAL HIGH (ref 3.5–5.1)
Sodium: 140 mmol/L (ref 135–145)
Total Bilirubin: 0.6 mg/dL (ref 0.3–1.2)
Total Protein: 5.7 g/dL — ABNORMAL LOW (ref 6.5–8.1)

## 2020-09-02 LAB — TROPONIN I (HIGH SENSITIVITY)
Troponin I (High Sensitivity): 130 ng/L (ref <18)
Troponin I (High Sensitivity): 99 ng/L — ABNORMAL HIGH (ref <18)

## 2020-09-02 LAB — ECHOCARDIOGRAM LIMITED
Height: 62 in
S' Lateral: 2.5 cm
Weight: 3111.13 oz

## 2020-09-02 LAB — C-REACTIVE PROTEIN: CRP: 0.9 mg/dL (ref <1.0)

## 2020-09-02 LAB — HEPARIN LEVEL (UNFRACTIONATED): Heparin Unfractionated: 0.84 IU/mL — ABNORMAL HIGH (ref 0.30–0.70)

## 2020-09-02 LAB — D-DIMER, QUANTITATIVE: D-Dimer, Quant: 0.52 ug/mL-FEU — ABNORMAL HIGH (ref 0.00–0.50)

## 2020-09-02 LAB — T4, FREE: Free T4: 1.08 ng/dL (ref 0.61–1.12)

## 2020-09-02 LAB — TSH: TSH: 0.304 u[IU]/mL — ABNORMAL LOW (ref 0.350–4.500)

## 2020-09-02 MED ORDER — SODIUM ZIRCONIUM CYCLOSILICATE 10 G PO PACK
10.0000 g | PACK | Freq: Two times a day (BID) | ORAL | Status: DC
  Administered 2020-09-02 – 2020-09-03 (×3): 10 g via ORAL
  Filled 2020-09-02 (×3): qty 1

## 2020-09-02 MED ORDER — ENOXAPARIN SODIUM 100 MG/ML ~~LOC~~ SOLN
90.0000 mg | SUBCUTANEOUS | Status: DC
  Administered 2020-09-02: 90 mg via SUBCUTANEOUS
  Filled 2020-09-02: qty 1

## 2020-09-02 MED ORDER — PANTOPRAZOLE SODIUM 40 MG PO TBEC
40.0000 mg | DELAYED_RELEASE_TABLET | Freq: Two times a day (BID) | ORAL | Status: DC
  Administered 2020-09-02 – 2020-10-13 (×82): 40 mg via ORAL
  Filled 2020-09-02 (×53): qty 1
  Filled 2020-09-02: qty 2
  Filled 2020-09-02 (×28): qty 1

## 2020-09-02 MED ORDER — ENOXAPARIN SODIUM 100 MG/ML ~~LOC~~ SOLN: 1.0000 mg/kg | SUBCUTANEOUS | Status: DC

## 2020-09-02 MED ORDER — PREDNISONE 5 MG PO TABS
30.0000 mg | ORAL_TABLET | Freq: Every day | ORAL | Status: DC
  Administered 2020-09-03 – 2020-09-05 (×3): 30 mg via ORAL
  Filled 2020-09-02 (×3): qty 1

## 2020-09-02 NOTE — Progress Notes (Signed)
PROGRESS NOTE                                                                                                                                                                                                             Patient Demographics:    [name] [name], is a 65 y.o. female, DOB - 1956-01-16, EQA:[id number]  Outpatient Primary MD for the patient is Glendale Chard, MD   Admit date - 08/18/2020   LOS - 79  Chief Complaint  Patient presents with  . Covid Positive  . Chest Pain  . Weakness       Brief Narrative: Patient is a 65 y.o. female with PMHx of renal transplant x2 (latest in 2016) on chronic immunosuppressive agents, DM-2, hypothyroidism-who presented with shortness of breath-found to have acute hypoxic respiratory failure due to COVID-19 pneumonia.  Hospital course complicated by slow improvement from severe hypoxemia, AKI and non-STEMI.  COVID-19 vaccinated status: Vaccinated including booster  Significant Events: 1/10>> Admit to [facility] for acute hypoxic respiratory failure due to COVID-19 pneumonia 1/11>> transfer to ICU due to worsening hypoxemia, AKI/metabolic acidosis/hyperkalemia. 1/14>> transferred to [facility] 1/23>> chest pain-non-STEMI-cardiology consult 1/25>> chest pain-resolved by SL nitroglycerin x1  Significant studies: 1/10>> chest x-ray: Severe multilobar bilateral pneumonia. 1/11>> ultrasound renal transplant: No hydronephrosis 1/16>> CT chest: Multifocal viral pneumonia   COVID-19 medications: Steroids: 1/10>> Remdesivir: 1/10>>1/15 Baricitinib: 1/16>>  Antibiotics: Rocephin: 1/12>> 1/18  Microbiology data: 1/10 >>blood culture: No growth 1/11>> urine culture: Klebsiella pneumoniae  Procedures: None  Consults: PCCM, nephrology, cardiology  DVT prophylaxis: SCDs Start: 08/19/20 0933    Subjective:   Still on 5 L-chest pain again this  morning-left-sided-pressure-like-resolved after 1 sublingual nitroglycerin tablet.   Assessment  & Plan :   Acute Hypoxic Resp Failure due to Covid 19 Viral pneumonia: Hypoxia improving-had severe hypoxemia on oxygen presentation and required heated high flow-down to 3-5 L at rest but requires significantly more with ambulation.  No signs of volume overload.  Continue tapering steroids-remains on baricitinib.    Fever: afebrile O2 requirements:  SpO2: 94 % O2 Flow Rate (L/min): 5 L/min FiO2 (%): 40 %   COVID-19 Labs: Recent Labs    08/31/20 0135 09/01/20 0022 09/02/20 0202  DDIMER 0.88* 0.53* 0.52*  CRP 0.6 0.6 0.9       Component Value Date/Time   BNP 235.3 (H)  08/31/2020 2212    No results for input(s): PROCALCITON in the last 168 hours.  Lab Results  Component Value Date   [place] NEGATIVE 04/27/2020   [place] Not Detected 01/30/2019     Prone/Incentive Spirometry: encouraged  incentive spirometry use 3-4/hour.  Sinus pause: Occurred on 1/20-approximately 4.25 seconds-this occurred while she was sleeping-after discussion with cardiology-beta-blocker discontinued.  Needs outpatient polysomnography.  Non-STEMI: Developed severe chest pain on 1/23 with EKG changes-relieved with nitroglycerin and morphine infusion-chest pain reoccurred on 1/25.  On aspirin/statin and IV heparin-awaiting echo-await further recommendations from cardiology.   AKI on CKD stage IV-transplanted kidney: AKI hemodynamically mediated-creatinine improved but seems to have plateaued lately.  Continue supportive care-nephrology did follow but has subsequently signed off.  History of renal transplant (second renal transplant-01/2015): Remains on Prograf-on steroids-Myfortic on hold.  Nephrology has signed off.  Hyperkalemia/anion gap metabolic acidosis: Secondary to AKI-has resolved.  Complicated UTI: Completed a course of Rocephin.  Afebrile and nontoxic-appearing.  DKA: Resolved-no longer  on insulin drip.  DM-2 (A1c 8.2 on 1/11) with uncontrolled hyperglycemia due to steroids: CBG stable-continue Levemir 18 units twice daily, 6 units of NovoLog with meals and SSI.  Follow and adjust.    Recent Labs    09/01/20 2044 09/02/20 0742 09/02/20 1209  GLUCAP 286* 97 124*    Hypothyroidism: Continue levothyroxine-given suppressed TSH-I have decreased dosage to 50 mcg-repeat TSH in 3 months.  HTN: BP stable-continue manidipine and hydralazine.   HLD: Continue statin.  Obesity: Estimated body mass index is 35.56 kg/m as calculated from the following:   Height as of this encounter: 5\' 2"  (1.575 m).   Weight as of this encounter: 88.2 kg.   RN pressure injury documentation: Pressure Injury 08/20/20 Coccyx Mid Stage 1 -  Intact skin with non-blanchable redness of a localized area usually over a bony prominence. (Active)  08/20/20 0800  Location: Coccyx  Location Orientation: Mid  Staging: Stage 1 -  Intact skin with non-blanchable redness of a localized area usually over a bony prominence.  Wound Description (Comments):   Present on Admission: Yes    GI prophylaxis: PPI  ABG:    Component Value Date/Time   PHART 7.266 (L) 08/19/2020 0808   PCO2ART 32.3 08/19/2020 0808   PO2ART 186 (H) 08/19/2020 0808   HCO3 19.0 (L) 08/20/2020 1101   TCO2 16 (L) 08/19/2020 0808   ACIDBASEDEF 6.4 (H) 08/20/2020 1101   O2SAT 85.1 08/20/2020 1101    Vent Settings: N/A    Condition - Extremely Guarded  Family Communication  :  Daughter Mendel Ryder [phone] updated on 1/25  Code Status :  Full Code  Diet :  Diet Order            Diet Carb Modified Fluid consistency: Thin; Room service appropriate? Yes  Diet effective now                  Disposition Plan  :   Status is: Inpatient  Remains inpatient appropriate because:Inpatient level of care appropriate due to severity of illness   Dispo: The patient is from: Home              Anticipated d/c is to: SNF               Anticipated d/c date is: > 3 days              Patient currently is not medically stable to d/c.   Barriers to discharge: Hypoxia requiring O2 supplementation  Antimicorbials  :    Anti-infectives (From admission, onward)   Start     Dose/Rate Route Frequency Ordered Stop   08/20/20 1445  cefTRIAXone (ROCEPHIN) 1 g in sodium chloride 0.9 % 100 mL IVPB        1 g 200 mL/hr over 30 Minutes Intravenous Every 24 hours 08/20/20 1348 08/26/20 2147   08/20/20 1000  remdesivir 100 mg in sodium chloride 0.9 % 100 mL IVPB       "Followed by" Linked Group Details   100 mg 200 mL/hr over 30 Minutes Intravenous Daily 08/19/20 0021 08/23/20 1113   08/19/20 0130  remdesivir 200 mg in sodium chloride 0.9% 250 mL IVPB       "Followed by" Linked Group Details   200 mg 580 mL/hr over 30 Minutes Intravenous Once 08/19/20 0021 08/19/20 0314      Inpatient Medications  Scheduled Meds: . amLODipine  10 mg Oral Daily  . vitamin C  500 mg Oral Daily  . aspirin EC  81 mg Oral Daily  . atorvastatin  40 mg Oral Daily  . baricitinib  1 mg Oral Daily  . feeding supplement (NEPRO CARB STEADY)  237 mL Oral BID BM  . fluticasone  1 spray Each Nare Daily  . hydrALAZINE  25 mg Oral Q8H  . insulin aspart  0-15 Units Subcutaneous TID WC  . insulin aspart  6 Units Subcutaneous TID WC  . insulin detemir  18 Units Subcutaneous BID  . levothyroxine  50 mcg Oral Q0600  . mouth rinse  15 mL Mouth Rinse BID  . nitroGLYCERIN  0.5 inch Topical Q6H  . pantoprazole  40 mg Oral BID  . predniSONE  40 mg Oral Q breakfast  . sodium bicarbonate  650 mg Oral TID  . tacrolimus  3 mg Oral QHS  . tacrolimus  4 mg Oral Daily  . zinc sulfate  220 mg Oral Daily   Continuous Infusions: . sodium chloride    . heparin 400 Units/hr (09/02/20 0700)  . sodium chloride Stopped (08/31/20 1545)   PRN Meds:.sodium chloride, acetaminophen **OR** acetaminophen, guaiFENesin, hydrALAZINE, labetalol, loperamide, morphine injection,  nitroGLYCERIN, ondansetron (ZOFRAN) IV, polyethylene glycol, sodium chloride   Time Spent in minutes  25    See all Orders from today for further details   Oren Binet M.D on 09/02/2020 at 12:11 PM  To page go to [email or web address] - use universal password  [facility] -  Office  [phone]    Objective:   Vitals:   09/02/20 0526 09/02/20 0547 09/02/20 0739 09/02/20 1206  BP: 135/67  135/66 118/64  Pulse: (!) 101  99 100  Resp: 20  20 19   Temp:   97.6 F (36.4 C) (!) 97.5 F (36.4 C)  TempSrc:   Oral Oral  SpO2: 90%  95% 94%  Weight:  88.2 kg    Height:        Wt Readings from Last 3 Encounters:  09/02/20 88.2 kg  05/14/20 92.5 kg  05/14/20 92.9 kg     Intake/Output Summary (Last 24 hours) at 09/02/2020 1211 Last data filed at 09/02/2020 0700 Gross per 24 hour  Intake 864.73 ml  Output 975 ml  Net -110.27 ml     Physical Exam Gen Exam:Alert awake-not in any distress HEENT:atraumatic, normocephalic Chest: B/L clear to auscultation anteriorly CVS:S1S2 regular Abdomen:soft non tender, non distended Extremities:no edema Neurology: Non focal Skin: no rash   Data Review:    CBC Recent Labs  Lab  08/28/20 0551 08/29/20 0128 08/30/20 0502 08/31/20 0135 09/01/20 0022 09/02/20 0202  WBC 11.8* 16.0* 15.5* 17.3* 23.0* 19.3*  HGB 10.8* 10.9* 11.2* 11.2* 10.9* 9.8*  HCT 33.0* 31.7* 34.2* 35.0* 32.6* 29.7*  PLT 138* 146* 126* 128* 107* 115*  MCV 75.0* 74.1* 76.3* 76.6* 76.7* 78.6*  MCH 24.5* 25.5* 25.0* 24.5* 25.6* 25.9*  MCHC 32.7 34.4 32.7 32.0 33.4 33.0  RDW 14.2 14.6 15.0 15.5 16.2* 16.5*  LYMPHSABS 0.4* 0.4*  --   --   --   --   MONOABS 0.5 0.8  --   --   --   --   EOSABS 0.0 0.0  --   --   --   --   BASOSABS 0.0 0.1  --   --   --   --     Chemistries  Recent Labs  Lab 08/29/20 0128 08/30/20 0502 08/31/20 0135 09/01/20 0022 09/02/20 0202  NA 135 141 140 140 140  K 4.0 4.7 4.5 4.8 5.2*  CL 101 104 105 108 107  CO2 24 25 25 22 23    GLUCOSE 177* 144* 162* 222* 185*  BUN 75* 71* 66* 59* 63*  CREATININE 1.94* 1.96* 1.87* 1.96* 1.96*  CALCIUM 9.8 9.5 9.8 9.7 9.7  AST 30 35 47* 40 97*  ALT 60* 90* 118* 102* 249*  ALKPHOS 75 82 92 74 102  BILITOT 0.8 0.7 0.9 0.5 0.6   ------------------------------------------------------------------------------------------------------------------ No results for input(s): CHOL, HDL, LDLCALC, TRIG, CHOLHDL, LDLDIRECT in the last 72 hours.  Lab Results  Component Value Date   HGBA1C 8.2 (H) 08/19/2020   ------------------------------------------------------------------------------------------------------------------ Recent Labs    09/02/20 0203  TSH 0.304*   ------------------------------------------------------------------------------------------------------------------ No results for input(s): VITAMINB12, FOLATE, FERRITIN, TIBC, IRON, RETICCTPCT in the last 72 hours.  Coagulation profile No results for input(s): INR, PROTIME in the last 168 hours.  Recent Labs    09/01/20 0022 09/02/20 0202  DDIMER 0.53* 0.52*    Cardiac Enzymes No results for input(s): CKMB, TROPONINI, MYOGLOBIN in the last 168 hours.  Invalid input(s): CK ------------------------------------------------------------------------------------------------------------------    Component Value Date/Time   BNP 235.3 (H) 08/31/2020 2212    Micro Results No results found for this or any previous visit (from the past 240 hour(s)).  Radiology Reports CT CHEST WO CONTRAST  Result Date: 08/24/2020 CLINICAL DATA:  Severe hypoxia and respiratory failure with COVID-19 infection. EXAM: CT CHEST WITHOUT CONTRAST TECHNIQUE: Multidetector CT imaging of the chest was performed following the standard protocol without IV contrast. COMPARISON:  Chest x-ray 08/18/2020 FINDINGS: Cardiovascular: Mild cardiomegaly. Mild calcification over the mitral valve annulus. Thoracic aorta is normal caliber. Stent over the right  subclavian/axillary region. Remaining vascular structures are unremarkable. Mediastinum/Nodes: No evidence of mediastinal or hilar adenopathy. Remaining mediastinal structures are unremarkable. Lungs/Pleura: Lungs are adequately inflated demonstrate moderate bilateral hazy airspace process likely multifocal viral pneumonia in this COVID-19 positive patient. No evidence of effusion. Airways are otherwise unremarkable. Upper Abdomen: No acute findings. Musculoskeletal: Degenerative change of the spine. Mild anterior wedging of a midthoracic vertebral body likely chronic. IMPRESSION: 1. Moderate bilateral hazy airspace process likely multifocal viral pneumonia in this COVID-19 positive patient. 2. Mild cardiomegaly. 3. Mild anterior wedging of a midthoracic vertebral body likely chronic. Electronically Signed   By: Marin Olp M.D.   On: 08/24/2020 12:37   US Renal Transplant w/Doppler  Result Date: 08/19/2020 CLINICAL DATA:  Acute kidney injury EXAM: ULTRASOUND OF RENAL TRANSPLANT WITH RENAL DOPPLER ULTRASOUND TECHNIQUE: Ultrasound examination of the  renal transplant was performed with gray-scale, color and duplex doppler evaluation. COMPARISON:  None. FINDINGS: Transplant kidney location: Left lower quadrant Transplant Kidney: Renal measurements: 10.8 x 6.2 x 6.5 cm = volume: 229mL. Normal in size and parenchymal echogenicity. No evidence of mass or hydronephrosis. No peri-transplant fluid collection seen. Color flow in the main renal artery:  Yes Color flow in the main renal vein:  Yes Duplex Doppler Evaluation: Main Renal Artery Velocity:  cm/sec Main Renal Artery Resistive Index: 0.99 Venous waveform in main renal vein:  Present Intrarenal resistive index in upper pole:  0.98 (normal 0.6-0.8; equivocal 0.8-0.9; abnormal >= 0.9) Intrarenal resistive index in lower pole: 0.98 (normal 0.6-0.8; equivocal 0.8-0.9; abnormal >= 0.9) Bladder: Normal for degree of bladder distention. Other findings:  None.  IMPRESSION: Elevated resistive indices within the left lower quadrant transplant kidney. No hydronephrosis. Electronically Signed   By: Rolm Baptise M.D.   On: 08/19/2020 19:15   DG CHEST PORT 1 VIEW  Result Date: 08/31/2020 CLINICAL DATA:  65 year old female with shortness of breath. EXAM: PORTABLE CHEST 1 VIEW COMPARISON:  Chest radiograph dated 08/18/2020. FINDINGS: Bilateral confluent and reticular densities, progressed since the prior radiograph and most consistent with multifocal pneumonia. No pleural effusion pneumothorax. Borderline cardiomegaly. No acute osseous pathology. IMPRESSION: Interval progression of bilateral pulmonary opacities compared to the radiograph of 08/18/2020. Continued follow-up recommended. Electronically Signed   By: Anner Crete M.D.   On: 08/31/2020 21:17   DG Chest Portable 1 View  Result Date: 08/18/2020 CLINICAL DATA:  66 year old female with history of COVID infection. Chest pain. Shortness of breath. EXAM: PORTABLE CHEST 1 VIEW COMPARISON:  Chest x-ray 10/20/2014. FINDINGS: Patchy multifocal airspace consolidation and areas of interstitial prominence are noted throughout the lungs bilaterally, most severe throughout the periphery of the mid to lower left lung, indicative of multilobar bilateral pneumonia. No definite pleural effusions. No evidence of pulmonary edema. No pneumothorax. Heart size is borderline enlarged. Upper mediastinal contours are within normal limits. Atherosclerotic calcifications in the thoracic aorta. Vascular stent projecting over the right subclavian region. IMPRESSION: 1. Severe multilobar bilateral (left greater than right) pneumonia compatible with reported COVID infection. 2. Aortic atherosclerosis. Electronically Signed   By: Vinnie Langton M.D.   On: 08/18/2020 18:02

## 2020-09-02 NOTE — Progress Notes (Signed)
ANTICOAGULATION CONSULT NOTE - Follow Up Consult  Pharmacy Consult for heparin Indication: chest pain/ACS  Labs: Recent Labs    08/31/20 0135 08/31/20 0135 08/31/20 1450 08/31/20 1815 08/31/20 2212 09/01/20 0022 09/01/20 1045 09/01/20 2108 09/02/20 0202 09/02/20 0203  HGB 11.2*  --   --   --   --  10.9*  --   --  9.8*  --   HCT 35.0*  --   --   --   --  32.6*  --   --  29.7*  --   PLT 128*  --   --   --   --  107*  --   --  115*  --   HEPARINUNFRC  --    < >  --   --   --  1.56* 2.04* 1.00*  --  0.84*  CREATININE 1.87*  --   --   --   --  1.96*  --   --  1.96*  --   TROPONINIHS  --   --  117* 123* 140*  --   --   --   --   --    < > = values in this interval not displayed.    Assessment: 65yo female remains supratherapeutic on heparin though now closer to goal; no gtt issues or signs of bleeding per RN.  Goal of Therapy:  Heparin level 0.3-0.7 units/ml   Plan:  Will decrease heparin gtt by 1 units/kg/hr to 400 units/hr and check level in 8 hours.  Consider foot sticks for labs to improve accuracy.  Wynona Neat, PharmD, BCPS  09/02/2020,4:11 AM

## 2020-09-02 NOTE — Progress Notes (Signed)
   09/01/20 2005  Assess: MEWS Score  Temp 97.7 F (36.5 C)  BP (!) 116/52  Pulse Rate (!) 111  ECG Heart Rate (!) 111  Resp 18  Level of Consciousness Alert  SpO2 90 %  O2 Device HFNC  Patient Activity (if Appropriate) In bed  O2 Flow Rate (L/min) 9 L/min (Increased for patient to use BSC. Patient returned to bed a few minutes ago.)  Assess: MEWS Score  MEWS Temp 0  MEWS Systolic 0  MEWS Pulse 2  MEWS RR 0  MEWS LOC 0  MEWS Score 2  MEWS Score Color Yellow  Assess: if the MEWS score is Yellow or Red  Were vital signs taken at a resting state? No (Patient went from [facility] to bed about 5 minutes prior.)  Focused Assessment No change from prior assessment  Early Detection of Sepsis Score *See Row Information* Low  MEWS guidelines implemented *See Row Information* No, vital signs rechecked  Treat  Pain Scale 0-10  Pain Score 1  Pain Type Acute pain  Pain Location Chest  Pain Orientation Mid;Medial;Right  Pain Descriptors / Indicators Tightness  Pain Frequency Intermittent  Pain Onset With Activity  Patients Stated Pain Goal 0  Pain Intervention(s) Emotional support;Rest   Patient transferred from bed to [facility], while sitting on BSC, SpO2 dropped as low as 79%, HR noted as high as 129. This RN had to keep increasing O2 as high as 11L to sustain SpO2 >87%. Patient SOB. Patient stood for peri-care, but had to sit back down on BSC to rest before transferring back to bed. Patient eventually transferred handheld assist back to bed. O2 decreased down to 9L once patient resting in bed. Now SpO2 97% on 7L, so O2 will be decreased back to 5L to assess patient status. HR below 110. No distress noted. Patient resting in bed, no complaints at this time.

## 2020-09-02 NOTE — Progress Notes (Signed)
Inpatient Diabetes Program Recommendations  AACE/ADA: New Consensus Statement on Inpatient Glycemic Control (2015)  Target Ranges:  Prepandial:   less than 140 mg/dL      Peak postprandial:   less than 180 mg/dL (1-2 hours)      Critically ill patients:  140 - 180 mg/dL   Lab Results  Component Value Date   GLUCAP 97 09/02/2020   HGBA1C 8.2 (H) 08/19/2020    Review of Glycemic Control Results for [name] [name], [name] [name] (MRN [id number]) as of 09/02/2020 12:11  Ref. Range 09/01/2020 08:01 09/01/2020 12:13 09/01/2020 17:25 09/01/2020 20:44 09/02/2020 07:42 09/02/2020 12:09  Glucose-Capillary Latest Ref Range: 70 - 99 mg/dL 108 (H) 209 (H) 302 (H) 286 (H) 97 124 (H)   Diabetes history: DM 2 Outpatient Diabetes medications: Levemir 30 units q AM and Levemir 35 units q HS, Novolog 4 units tid with meals Current orders for Inpatient glycemic control:  Levemir 18 units bid Novolog 0-15 units tid Novolog 6 units tid meal coverage  Nepro bid PO prednisone 40 mg Daily  Inpatient Diabetes Program Recommendations:    Glucose trends increase after meals with Novolog 6 units meal coverage.  -  Increase Novolog meal coverage to 10 units tid.  Thanks,  Tama Headings RN, MSN, BC-ADM Inpatient Diabetes Coordinator Team Pager [phone] (8a-5p)

## 2020-09-02 NOTE — TOC Initial Note (Addendum)
Transition of Care [facility]) - Initial/Assessment Note    Patient Details  Name: [name] [name] MRN: [id number] Date of Birth: [birth year]  Transition of Care [facility]) CM/SW Contact:    Benard Halsted, LCSW Phone Number: 09/02/2020, 1:08 PM  Clinical Narrative:                 1pm-CSW received consult for possible SNF placement at time of discharge. CSW spoke with patient. Patient reported that she lives alone and expressed understanding of PT recommendation and may be agreeable to SNF placement at time of discharge. CSW explained alternative option of home health and that they will not provide 24 hour care. She requested to speak with her family about it before deciding. She requested CSW call her daughter. CSW unable to leave voicemail for daughter, Mendel Ryder. CSW sent HIPPA compliant text requesting a call back.  1:50pm-CSW received return call from daughter. She is thinking that SNF would be better for rehab when she leaves the hospital and then daughter will stay with her after that. CSW will send out referral. Daughter requesting to speak with RN Director to discuss concerns about care received this past weekend. CSW sent message to Soil scientist and Agricultural consultant, Training and development officer.   Expected Discharge Plan: Skilled Nursing Facility Barriers to Discharge: Continued Medical Work up,SNF Pending bed offer,SNF Covid Recovering   Patient Goals and CMS Choice Patient states their goals for this hospitalization and ongoing recovery are:: Rehab CMS Medicare.gov Compare Post Acute Care list provided to:: Patient Choice offered to / list presented to : [place]  Expected Discharge Plan and Services Expected Discharge Plan: [place] In-house Referral: Clinical Social Work   Post Acute Care Choice: [place] Living arrangements for the past 2 months: [place]                                      Prior Living Arrangements/Services Living  arrangements for the past 2 months: Single Family Home Lives with:: Self Patient language and need for interpreter reviewed:: Yes Do you feel safe going back to the place where you live?: Yes      Need for Family Participation in Patient Care: Yes (Comment) Care giver support system in place?: Yes (comment)   Criminal Activity/Legal Involvement Pertinent to Current Situation/Hospitalization: No - Comment as needed  Activities of Daily Living Home Assistive Devices/Equipment: None ADL Screening (condition at time of admission) Patient's cognitive ability adequate to safely complete daily activities?: Yes Is the patient deaf or have difficulty hearing?: No Does the patient have difficulty seeing, even when wearing glasses/contacts?: No Does the patient have difficulty concentrating, remembering, or making decisions?: No Patient able to express need for assistance with ADLs?: Yes Does the patient have difficulty dressing or bathing?: No Independently performs ADLs?: Yes (appropriate for developmental age) Does the patient have difficulty walking or climbing stairs?: No Weakness of Legs: Both ("since I've been sick") Weakness of Arms/Hands: None  Permission Sought/Granted Permission sought to share information with : Facility Contact Representative,Family Supports Permission granted to share information with : Yes, Verbal Permission Granted  Share Information with NAME: Mendel Ryder  Permission granted to share info w AGENCY: SNFs  Permission granted to share info w Relationship: Daughter  Permission granted to share info w Contact Information: [phone]  Emotional Assessment   Attitude/Demeanor/Rapport: Engaged Affect (typically observed): Accepting,Appropriate Orientation: : Oriented to Self,Oriented to Place,Oriented  to  Time,Oriented to Situation Alcohol / Substance Use: Not Applicable Psych Involvement: No (comment)  Admission diagnosis:  Respiratory failure ([place])  [J96.90] Acute hyperkalemia [E87.5] ARF (acute renal failure) (HCC) [N17.9] AKI (acute kidney injury) ([place]) [N17.9] Renal transplant recipient [Z94.0] Acute hypoxemic respiratory failure due to COVID-19 ([place]) [U07.1, J96.01] Patient Active Problem List   Diagnosis Date Noted  . Pressure injury of skin 08/22/2020  . Acute hyperkalemia   . Diabetic ketoacidosis without coma associated with type 1 diabetes mellitus ([place])   . Acute hypoxemic respiratory failure due to COVID-19 ([place]) 08/19/2020  . Respiratory failure ([place]) 08/19/2020  . ARF (acute renal failure) ([place]) 08/18/2020  . Other vitreous opacities, right eye 05/08/2020  . Posterior vitreous detachment of left eye 05/08/2020  . Posterior vitreous detachment of right eye 05/08/2020  . Pseudophakia, both eyes 05/08/2020  . DM (diabetes mellitus), type 2 with renal complications ([place]) 25/00/3704  . Norovirus 07/17/2015  . Hypercalcemia associated with chronic dialysis 05/06/2015  . Hypercholesteremia 05/06/2015  . Obesity, morbid, BMI 40.0-49.9 ([place]) 05/06/2015  . Secondary renal hyperparathyroidism ([place]) 05/06/2015  . Essential hypertension with goal blood pressure less than 140/90 04/24/2015  . Immunosuppression ([place]) 04/24/2015  . Immunosuppressive management encounter following kidney transplant 01/28/2012  . CRF (chronic renal failure) 12/08/2011  . AKI (acute kidney injury) ([place]) 12/08/2011  . Renal transplant recipient 12/08/2011  . Immunocompromised ([place]) 12/08/2011   PCP:  Glendale Chard, MD Pharmacy:   [id number] - [place], [place] [address] Phone: [phone] Fax: [phone]     Social Determinants of Health (SDOH) Interventions    Readmission Risk Interventions No flowsheet data found.

## 2020-09-02 NOTE — Progress Notes (Signed)
  Echocardiogram 2D Echocardiogram has been performed.  [name] [name] 09/02/2020, 10:33 AM

## 2020-09-02 NOTE — Progress Notes (Signed)
Progress Note  Patient Name: [name] [name] Date of Encounter: 09/02/2020  Primary Cardiologist: Dr. Harrell Gave  Subjective   Had an episode of chest pain earlier today. Describes it as an ache. ECG similar, hsTnI similar. She is not sure how long it lasted. She does not have any current chest pain.  Inpatient Medications    Scheduled Meds: . amLODipine  10 mg Oral Daily  . vitamin C  500 mg Oral Daily  . aspirin EC  81 mg Oral Daily  . atorvastatin  40 mg Oral Daily  . baricitinib  1 mg Oral Daily  . enoxaparin (LOVENOX) injection  90 mg Subcutaneous Q24H  . feeding supplement (NEPRO CARB STEADY)  237 mL Oral BID BM  . fluticasone  1 spray Each Nare Daily  . hydrALAZINE  25 mg Oral Q8H  . insulin aspart  0-15 Units Subcutaneous TID WC  . insulin aspart  6 Units Subcutaneous TID WC  . insulin detemir  18 Units Subcutaneous BID  . levothyroxine  50 mcg Oral Q0600  . mouth rinse  15 mL Mouth Rinse BID  . nitroGLYCERIN  0.5 inch Topical Q6H  . pantoprazole  40 mg Oral BID  . [START ON 09/03/2020] predniSONE  30 mg Oral Q breakfast  . sodium bicarbonate  650 mg Oral TID  . sodium zirconium cyclosilicate  10 g Oral BID  . tacrolimus  3 mg Oral QHS  . tacrolimus  4 mg Oral Daily  . zinc sulfate  220 mg Oral Daily   Continuous Infusions: . sodium chloride    . sodium chloride Stopped (08/31/20 1545)   PRN Meds: sodium chloride, acetaminophen **OR** acetaminophen, guaiFENesin, hydrALAZINE, loperamide, morphine injection, nitroGLYCERIN, ondansetron (ZOFRAN) IV, polyethylene glycol, sodium chloride   Vital Signs    Vitals:   09/02/20 0526 09/02/20 0547 09/02/20 0739 09/02/20 1206  BP: 135/67  135/66 118/64  Pulse: (!) 101  99 100  Resp: 20  20 19   Temp:   97.6 F (36.4 C) (!) 97.5 F (36.4 C)  TempSrc:   Oral Oral  SpO2: 90%  95% 94%  Weight:  88.2 kg    Height:        Intake/Output Summary (Last 24 hours) at 09/02/2020 1537 Last data filed at 09/02/2020  1317 Gross per 24 hour  Intake 599.23 ml  Output 975 ml  Net -375.77 ml   Last 3 Weights 09/02/2020 09/01/2020 08/31/2020  Weight (lbs) 194 lb 7.1 oz 193 lb 12.6 oz 190 lb 0.6 oz  Weight (kg) 88.2 kg 87.9 kg 86.2 kg     Telemetry    NSR - Personally Reviewed  ECG    1/25 sinus tachycardia, LVH with repolarization changes/STD/TWI in I, II, avL and subtle ST sagging in avF, V3-V6 - Personally Reviewed  Physical Exam   GEN: Well nourished, well developed in no acute distress. [place] in place NECK: No JVD CARDIAC: regular rhythm, normal S1 and S2, no rubs or gallops. No murmur. VASCULAR: Radial pulses 2+ bilaterally.  RESPIRATORY:  Clear to auscultation without rales, wheezing or rhonchi  ABDOMEN: Soft, non-tender, non-distended MUSCULOSKELETAL:  Moves all 4 limbs independently SKIN: Warm and dry, no edema NEUROLOGIC:  No focal neuro deficits noted. PSYCHIATRIC:  Normal affect   Labs    High Sensitivity Troponin:   Recent Labs  Lab 08/19/20 0216 08/31/20 1450 08/31/20 1815 08/31/20 2212 09/02/20 1127  TROPONINIHS 27* 117* 123* 140* 130*      Cardiac EnzymesNo results for input(s):  TROPONINI in the last 168 hours. No results for input(s): TROPIPOC in the last 168 hours.   Chemistry Recent Labs  Lab 08/31/20 0135 09/01/20 0022 09/02/20 0202  NA 140 140 140  K 4.5 4.8 5.2*  CL 105 108 107  CO2 25 22 23   GLUCOSE 162* 222* 185*  BUN 66* 59* 63*  CREATININE 1.87* 1.96* 1.96*  CALCIUM 9.8 9.7 9.7  PROT 6.3* 6.0* 5.7*  ALBUMIN 2.9* 2.8* 2.8*  AST 47* 40 97*  ALT 118* 102* 249*  ALKPHOS 92 74 102  BILITOT 0.9 0.5 0.6  GFRNONAA 29* 28* 28*  ANIONGAP 10 10 10      Hematology Recent Labs  Lab 08/31/20 0135 09/01/20 0022 09/02/20 0202  WBC 17.3* 23.0* 19.3*  RBC 4.57 4.25 3.78*  HGB 11.2* 10.9* 9.8*  HCT 35.0* 32.6* 29.7*  MCV 76.6* 76.7* 78.6*  MCH 24.5* 25.6* 25.9*  MCHC 32.0 33.4 33.0  RDW 15.5 16.2* 16.5*  PLT 128* 107* 115*    BNP Recent Labs  Lab  08/31/20 2212  BNP 235.3*     DDimer  Recent Labs  Lab 08/31/20 0135 09/01/20 0022 09/02/20 0202  DDIMER 0.88* 0.53* 0.52*     Radiology    DG CHEST PORT 1 VIEW  Result Date: 08/31/2020 CLINICAL DATA:  65 year old female with shortness of breath. EXAM: PORTABLE CHEST 1 VIEW COMPARISON:  Chest radiograph dated 08/18/2020. FINDINGS: Bilateral confluent and reticular densities, progressed since the prior radiograph and most consistent with multifocal pneumonia. No pleural effusion pneumothorax. Borderline cardiomegaly. No acute osseous pathology. IMPRESSION: Interval progression of bilateral pulmonary opacities compared to the radiograph of 08/18/2020. Continued follow-up recommended. Electronically Signed   By: Anner Crete M.D.   On: 08/31/2020 21:17   ECHOCARDIOGRAM LIMITED  Result Date: 09/02/2020    ECHOCARDIOGRAM LIMITED REPORT   Patient [facility]:   [facility] Date of Exam: 09/02/2020 Medical Rec #:  [id number]               Height:       62.0 in Accession #:    [id number]              Weight:       194.4 lb Date of Birth:  01-16-1956              BSA:          1.889 m Patient Age:    66 years                BP:           135/66 mmHg Patient Gender: F                       HR:           100 bpm. Exam Location:  Inpatient Procedure: Limited Echo, Limited Color Doppler and Cardiac Doppler Indications:    chest pain  History:        Patient has prior history of Echocardiogram examinations, most                 recent 09/30/2006. Covid; Risk Factors:Diabetes and Hypertension.  Sonographer:    Johny Chess Referring Phys: [place]  1. Left ventricular ejection fraction, by estimation, is >75%. The left ventricle has hyperdynamic function. The left ventricle has no regional wall motion abnormalities. There is moderate left ventricular hypertrophy. Left ventricular diastolic parameters are consistent with Grade I diastolic dysfunction (impaired  relaxation).   2. Right ventricular systolic function is normal. The right ventricular size is normal.  3. The mitral valve is normal in structure. No evidence of mitral valve regurgitation. No evidence of mitral stenosis. Moderate mitral annular calcification.  4. The aortic valve is tricuspid. Aortic valve regurgitation is not visualized. Mild aortic valve sclerosis is present, with no evidence of aortic valve stenosis.  5. The inferior vena cava is normal in size with greater than 50% respiratory variability, suggesting right atrial pressure of 3 mmHg. FINDINGS  Left Ventricle: Left ventricular ejection fraction, by estimation, is >75%. The left ventricle has hyperdynamic function. The left ventricle has no regional wall motion abnormalities. The left ventricular internal cavity size was normal in size. There is moderate left ventricular hypertrophy. Left ventricular diastolic parameters are consistent with Grade I diastolic dysfunction (impaired relaxation). Right Ventricle: The right ventricular size is normal. Right ventricular systolic function is normal. Left Atrium: Left atrial size was normal in size. Right Atrium: Right atrial size was normal in size. Pericardium: There is no evidence of pericardial effusion. Mitral Valve: The mitral valve is normal in structure. Moderate mitral annular calcification. No evidence of mitral valve stenosis. Tricuspid Valve: The tricuspid valve is normal in structure. Tricuspid valve regurgitation is trivial. No evidence of tricuspid stenosis. Aortic Valve: The aortic valve is tricuspid. Aortic valve regurgitation is not visualized. Mild aortic valve sclerosis is present, with no evidence of aortic valve stenosis. Pulmonic Valve: The pulmonic valve was normal in structure. Pulmonic valve regurgitation is not visualized. No evidence of pulmonic stenosis. Aorta: The aortic root is normal in size and structure. Venous: The inferior vena cava is normal in size with greater than 50%  respiratory variability, suggesting right atrial pressure of 3 mmHg. IAS/Shunts: No atrial level shunt detected by color flow Doppler. LEFT VENTRICLE PLAX 2D LVIDd:         3.70 cm  Diastology LVIDs:         2.50 cm  LV e' medial:    5.33 cm/s LV PW:         1.00 cm  LV E/e' medial:  13.8 LV IVS:        0.90 cm  LV e' lateral:   5.66 cm/s LVOT diam:     1.70 cm  LV E/e' lateral: 13.0 LV SV:         59 LV SV Index:   31 LVOT Area:     2.27 cm  IVC IVC diam: 1.30 cm LEFT ATRIUM         Index LA diam:    3.90 cm 2.06 cm/m  AORTIC VALVE LVOT Vmax:   170.00 cm/s LVOT Vmean:  114.000 cm/s LVOT VTI:    0.258 m  AORTA Ao Asc diam: 2.60 cm MV E velocity: 73.50 cm/s MV A velocity: 121.00 cm/s  SHUNTS MV E/A ratio:  0.61         Systemic VTI:  0.26 m                             Systemic Diam: 1.70 cm Kirk Ruths MD Electronically signed by Kirk Ruths MD Signature Date/Time: 09/02/2020/12:26:37 PM    Final     Cardiac Studies   Echo 09/02/20 1. Left ventricular ejection fraction, by estimation, is >75%. The left  ventricle has hyperdynamic function. The left ventricle has no regional  wall motion abnormalities. There is moderate left ventricular hypertrophy.  Left ventricular diastolic  parameters are consistent with Grade I diastolic dysfunction (impaired  relaxation).  2. Right ventricular systolic function is normal. The right ventricular  size is normal.  3. The mitral valve is normal in structure. No evidence of mitral valve  regurgitation. No evidence of mitral stenosis. Moderate mitral annular  calcification.  4. The aortic valve is tricuspid. Aortic valve regurgitation is not  visualized. Mild aortic valve sclerosis is present, with no evidence of  aortic valve stenosis.  5. The inferior vena cava is normal in size with greater than 50%  respiratory variability, suggesting right atrial pressure of 3 mmHg.   Patient Profile     65 y.o. vaccinated (+boosted) female with CKD stage IV  with prior history of renal transplant x 2 most recently in 2016 on chronic immunosuppressant therapy, HTN, HLD, DM2, hypothyroidism, gout, sickle cell trait developed symptoms concerning for Covid on 08/06/21. Cardiology is following for chest pain episode on 08/31/20 with troponin elevation to 140.   Assessment & Plan    Chest pain with elevated troponin/concern for NSTEMI - 2D echo with hyperdynamic function, no regional wall motion abnormalities - continue aspirin, statin - with prior sinus pause, no beta blocker - echo and troponin pattern more consistent with demand ischemia. hsTnI trend 117, 123, 140, 130. - would stop heparin after 48 hours unless needed for another clinical indication - can use nitro PRN  Essential HTN - well controlled, continue current medications  Hyperlipidemia - simvastatin changed to atorvastatin this admission  Per primary team and nephrology: Acute hypoxic respiratory failure secondary to Covid-19 PNA Acute on CKD stage IV s/p renal transplant x2  UTI DKA Hypothyroidism  For questions or updates, please contact [facility] Please consult [email or web address] for contact info under Cardiology/STEMI.  Signed, Buford Dresser, MD 09/02/2020, 3:37 PM

## 2020-09-02 NOTE — TOC Progression Note (Signed)
Transition of Care [facility]) - Progression Note    Patient Details  Name: [name] [name] MRN: [id number] Date of Birth: 09-05-1955  Transition of Care [facility]) CM/SW [place], LCSW Phone Number: 09/02/2020, 3:09 PM  Clinical Narrative:    CSW received call from patient's other daughter, Lilia Pro. She stated that they had spoken with patient about discharge plan and patient is wanting to return home at dc. She will confer with her sister to make sure they can care for her and let CSW know. Patient would [facility] health, oxygen, rolling walker, and 3in1 BSC. CSW confirmed address.    Expected Discharge Plan: [place] Barriers to Discharge: Continued Medical Work up,SNF Pending bed offer,SNF Covid Recovering  Expected Discharge Plan and Services Expected Discharge Plan: [place] In-house Referral: Clinical Social Work   Post Acute Care Choice: [place] Living arrangements for the past 2 months: Single Family Home                                       Social Determinants of Health (SDOH) Interventions    Readmission Risk Interventions No flowsheet data found.

## 2020-09-02 NOTE — NC FL2 (Signed)
[place] LEVEL OF CARE SCREENING TOOL     IDENTIFICATION  Patient Name: [name] [name] Birthdate: 02/06/1956 Sex: female Admission Date (Current Location): 08/18/2020  [place] and [place] Number:  Herbalist and Address:  [facility], [place] [address] [place], [place] [address]      Provider Number: [id number]  Attending Physician Name and Address:  Jonetta Osgood, MD  Relative Name and Phone Number:  Mendel Ryder, daughter, [phone]    Current Level of Care: Hospital Recommended Level of Care: [place] Prior Approval Number:    Date Approved/Denied:   PASRR Number: [id number]  Discharge Plan: SNF    Current Diagnoses: Patient Active Problem List   Diagnosis Date Noted  . Pressure injury of skin 08/22/2020  . Acute hyperkalemia   . Diabetic ketoacidosis without coma associated with type 1 diabetes mellitus ([place])   . Acute hypoxemic respiratory failure due to COVID-19 ([place]) 08/19/2020  . Respiratory failure ([place]) 08/19/2020  . ARF (acute renal failure) ([place]) 08/18/2020  . Other vitreous opacities, right eye 05/08/2020  . Posterior vitreous detachment of left eye 05/08/2020  . Posterior vitreous detachment of right eye 05/08/2020  . Pseudophakia, both eyes 05/08/2020  . DM (diabetes mellitus), type 2 with renal complications ([place]) 50/35/4656  . Norovirus 07/17/2015  . Hypercalcemia associated with chronic dialysis 05/06/2015  . Hypercholesteremia 05/06/2015  . Obesity, morbid, BMI 40.0-49.9 ([place]) 05/06/2015  . Secondary renal hyperparathyroidism ([place]) 05/06/2015  . Essential hypertension with goal blood pressure less than 140/90 04/24/2015  . Immunosuppression ([place]) 04/24/2015  . Immunosuppressive management encounter following kidney transplant 01/28/2012  . CRF (chronic renal failure) 12/08/2011  . AKI (acute kidney injury) ([place]) 12/08/2011  . Renal transplant recipient 12/08/2011  .  Immunocompromised ([place]) 12/08/2011    Orientation RESPIRATION BLADDER Height & Weight     Self,Time,Situation,Place  O2 (5L nasal cannula) Continent,External catheter Weight: 194 lb 7.1 oz (88.2 kg) Height:  5\' 2"  (157.5 cm)  BEHAVIORAL SYMPTOMS/MOOD NEUROLOGICAL BOWEL NUTRITION STATUS      Incontinent Diet (Please see DC Summary)  AMBULATORY STATUS COMMUNICATION OF NEEDS Skin   Extensive Assist Verbally PU Stage and Appropriate Care (Stage I on coccyx)                       Personal Care Assistance Level of Assistance  Bathing,Feeding,Dressing Bathing Assistance: Limited assistance Feeding assistance: Independent Dressing Assistance: Limited assistance     Functional Limitations Info             SPECIAL CARE FACTORS FREQUENCY  PT (By licensed PT),OT (By licensed OT)     PT Frequency: 5x/week OT Frequency: 5x/week            Contractures Contractures Info: Not present    Additional Factors Info  Code Status,Allergies,Isolation Precautions,Insulin Sliding Scale Code Status Info: Full Allergies Info: Codeine, Acetaminophen-codeine   Insulin Sliding Scale Info: See dc summary Isolation Precautions Info: COVID+ 08/13/20 (14 days past)     Current Medications (09/02/2020):  This is the current hospital active medication list Current Facility-Administered Medications  Medication Dose Route Frequency Provider Last Rate Last Admin  . 0.9 %  sodium chloride infusion   Intravenous PRN Audria Nine, DO      . acetaminophen (TYLENOL) tablet 650 mg  650 mg Oral Q6H PRN Rise Patience, MD   650 mg at 08/22/20 2000   Or  . acetaminophen (TYLENOL) suppository 650 mg  650 mg Rectal Q6H PRN Rise Patience, MD      . amLODipine (NORVASC) tablet 10 mg  10 mg Oral Daily Elgergawy, Silver Huguenin, MD   10 mg at 09/02/20 0804  . ascorbic acid (VITAMIN C) tablet 500 mg  500 mg Oral Daily Rise Patience, MD   500 mg at 09/02/20 8099  . aspirin EC tablet 81 mg  81  mg Oral Daily Arnoldo Lenis, MD   81 mg at 09/02/20 0804  . atorvastatin (LIPITOR) tablet 40 mg  40 mg Oral Daily Jonetta Osgood, MD   40 mg at 09/02/20 0803  . baricitinib (OLUMIANT) tablet 1 mg  1 mg Oral Daily Elgergawy, Silver Huguenin, MD   1 mg at 09/02/20 0803  . enoxaparin (LOVENOX) injection 90 mg  90 mg Subcutaneous Q24H Pierce, Dwayne A, RPH   90 mg at 09/02/20 1318  . feeding supplement (NEPRO CARB STEADY) liquid 237 mL  237 mL Oral BID BM Elgergawy, Silver Huguenin, MD 0 mL/hr at 08/31/20 1900 237 mL at 09/02/20 1317  . fluticasone (FLONASE) 50 MCG/ACT nasal spray 1 spray  1 spray Each Nare Daily Rise Patience, MD   1 spray at 09/02/20 0805  . guaiFENesin (ROBITUSSIN) 100 MG/5ML solution 100 mg  5 mL Oral Q4H PRN Audria Nine, DO   100 mg at 08/26/20 2129  . hydrALAZINE (APRESOLINE) injection 10 mg  10 mg Intravenous Q4H PRN Audria Nine, DO   10 mg at 08/21/20 0653  . hydrALAZINE (APRESOLINE) tablet 25 mg  25 mg Oral Q8H Elgergawy, Silver Huguenin, MD   25 mg at 09/02/20 1317  . insulin aspart (novoLOG) injection 0-15 Units  0-15 Units Subcutaneous TID WC Audria Nine, DO   2 Units at 09/02/20 1251  . insulin aspart (novoLOG) injection 6 Units  6 Units Subcutaneous TID WC Jonetta Osgood, MD   6 Units at 09/02/20 1251  . insulin detemir (LEVEMIR) injection 18 Units  18 Units Subcutaneous BID Jonetta Osgood, MD   18 Units at 09/02/20 0804  . levothyroxine (SYNTHROID) tablet 50 mcg  50 mcg Oral Q0600 Jonetta Osgood, MD   50 mcg at 09/02/20 0537  . loperamide (IMODIUM) capsule 2 mg  2 mg Oral PRN Elgergawy, Silver Huguenin, MD   2 mg at 08/28/20 0335  . MEDLINE mouth rinse  15 mL Mouth Rinse BID Kipp Brood, MD   15 mL at 09/02/20 0805  . morphine 2 MG/ML injection 1 mg  1 mg Intravenous Q4H PRN Jonetta Osgood, MD   1 mg at 08/31/20 1353  . nitroGLYCERIN (NITROGLYN) 2 % ointment 0.5 inch  0.5 inch Topical Q6H Arnoldo Lenis, MD   0.5 inch at 09/02/20 1252  .  nitroGLYCERIN (NITROSTAT) SL tablet 0.4 mg  0.4 mg Sublingual Q5 min PRN Jonetta Osgood, MD   0.4 mg at 09/02/20 0950  . ondansetron (ZOFRAN) injection 4 mg  4 mg Intravenous Q6H PRN Jonetta Osgood, MD   4 mg at 08/31/20 1357  . pantoprazole (PROTONIX) EC tablet 40 mg  40 mg Oral BID Ghimire, Shanker M, MD      . polyethylene glycol (MIRALAX / GLYCOLAX) packet 17 g  17 g Oral Daily PRN Kipp Brood, MD      . Derrill Memo ON 09/03/2020] predniSONE (DELTASONE) tablet 30 mg  30 mg Oral Q breakfast Ghimire, Shanker M, MD      . sodium bicarbonate tablet 650 mg  650  mg Oral TID Audria Nine, DO   650 mg at 09/02/20 0813  . sodium chloride 0.9 % bolus 1,000 mL  1,000 mL Intravenous Daily PRN Jonetta Osgood, MD   Stopped at 08/31/20 1545  . sodium zirconium cyclosilicate (LOKELMA) packet 10 g  10 g Oral BID Jonetta Osgood, MD   10 g at 09/02/20 1323  . tacrolimus (PROGRAF) capsule 3 mg  3 mg Oral QHS Kipp Brood, MD   3 mg at 09/01/20 2213  . tacrolimus (PROGRAF) capsule 4 mg  4 mg Oral Daily Kipp Brood, MD   4 mg at 09/02/20 0803  . zinc sulfate capsule 220 mg  220 mg Oral Daily Rise Patience, MD   220 mg at 09/02/20 8871     Discharge Medications: Please see discharge summary for a list of discharge medications.  Relevant Imaging Results:  Relevant Lab Results:   Additional Information SSN: [id number]. [place] COVID-19 Vaccine 06/14/2020 , 11/15/2019 , 10/25/2019  Benard Halsted, LCSW

## 2020-09-02 NOTE — Progress Notes (Signed)
Occupational Therapy Treatment Patient Details Name: [name] [name] MRN: [id number] DOB: Jul 31, 1956 Today's Date: 09/02/2020    History of present illness Pt is a 65 y.o. female who tested (+) COVID-19 on 08/13/20, now admitted 08/18/20 with worsening SOB, cough, chest pain and body aches. Workup for acute hypoxic respiratory failure due to COVID-19 PNA, DKA, AKI on CKD, UTI. PMH includes HTN, DM2, chronic steroid use, s/p renal transplant x2.Episode of CP 1/22; per Cardiology: troponin, EKG and history felt concerning for [place] / possibly NSTEMI - difficult to know in context of multiple medical stressors   OT comments  Pt with limited participation today. She would not attempt EOB or OOB despite encouragement. Pt with vague/intermittently confused answers but ultimately appears to be declining mobility due to pain (intermittent chest pain and bil shoulders). Pt completing bed mobility with minA for pericare after using bed pan. SpO2 >/=87% on 8L HFNC with activity with HR into the 110s. Continue to recommend post acute rehab services at time of discharge. Acute OT to follow.    Follow Up Recommendations  SNF;LTACH;Supervision/Assistance - 24 hour    Equipment Recommendations  3 in 1 bedside commode          Precautions / Restrictions Precautions Precautions: Fall;Other (comment) Precaution Comments: Watch SpO2 - quick to desaturate Restrictions Weight Bearing Restrictions: No       Mobility Bed Mobility Overal bed mobility: Needs Assistance Bed Mobility: Rolling Rolling: Min guard;Min assist         General bed mobility comments: intermittent minA to rolling towards sidelying, rolling for bedpan and changing bed pad. pt able to bridge hips x3 occasions without significant difficulty  Transfers                 General transfer comment: pt declined attempts today        ADL either performed or assessed with clinical judgement   ADL Overall ADL's : Needs  assistance/impaired                             Toileting- Clothing Manipulation and Hygiene: Maximal assistance;Bed level Toileting - Clothing Manipulation Details (indicate cue type and reason): pt would not attempt OOB to [facility] today (states due to pain), use of bed pan for voiding bladder and assist for posterior pericare                               Cognition Arousal/Alertness: Awake/alert Behavior During Therapy: Flat affect Overall Cognitive Status: No family/caregiver present to determine baseline cognitive functioning                                 General Comments: pt with flat affect and minimally engaging today        Exercises     Shoulder Instructions       General Comments      Pertinent Vitals/ Pain       Pain Assessment: Faces Faces Pain Scale: Hurts little more Pain Location: chest intermittently, bil shoulders Pain Descriptors / Indicators: Discomfort Pain Intervention(s): Monitored during session  Home Living  Prior Functioning/Environment              Frequency  Min 2X/week        Progress Toward Goals  OT Goals(current goals can now be found in the care plan section)  Progress towards OT goals: OT to reassess next treatment  Acute Rehab OT Goals Patient Stated Goal: Hopeful for return home, but willing to consider post-acute rehab at SNF OT Goal Formulation: With patient Time For Goal Achievement: 09/06/20 Potential to Achieve Goals: Good ADL Goals Pt Will Perform Grooming: with supervision;sitting;standing Pt Will Perform Lower Body Bathing: with supervision;sitting/lateral leans;sit to/from stand Pt Will Perform Upper Body Dressing: with modified independence;sitting Pt Will Perform Lower Body Dressing: with supervision;sit to/from stand Pt Will Transfer to Toilet: with supervision;ambulating Pt Will Perform Toileting - Clothing  Manipulation and hygiene: with supervision;sit to/from stand;sitting/lateral leans Pt/caregiver will Perform Home Exercise Program: Increased strength;Both right and left upper extremity;With written HEP provided;Independently;With theraband Additional ADL Goal #1: Pt will independently demonstrate carryover of energy conservation techniques during ADL/functional task.  Plan Discharge plan remains appropriate    Co-evaluation                 AM-PAC OT "6 Clicks" Daily Activity     Outcome Measure   Help from another person eating meals?: None Help from another person taking care of personal grooming?: A Little Help from another person toileting, which includes using toliet, bedpan, or urinal?: A Lot Help from another person bathing (including washing, rinsing, drying)?: A Lot Help from another person to put on and taking off regular upper body clothing?: A Little Help from another person to put on and taking off regular lower body clothing?: A Lot 6 Click Score: 16    End of Session Equipment Utilized During Treatment: Oxygen  OT Visit Diagnosis: Muscle weakness (generalized) (M62.81);Unsteadiness on feet (R26.81)   Activity Tolerance Patient tolerated treatment well;Patient limited by pain   Patient Left in bed;with call bell/phone within reach;with bed alarm set   Nurse Communication Mobility status        Time: 8675-4492 OT Time Calculation (min): 24 min  Charges: OT General Charges $OT Visit: 1 Visit OT Treatments $Self Care/Home Management : 23-37 mins  Lou Cal, OT Acute Rehabilitation Services Pager [phone] Office [phone]    Raymondo Band 09/02/2020, 5:23 PM

## 2020-09-02 NOTE — Progress Notes (Signed)
[place] for IV heparin Indication: chest pain/ACS   Labs: Recent Labs    08/31/20 0135 08/31/20 0135 08/31/20 1450 08/31/20 1815 08/31/20 2212 09/01/20 0022 09/01/20 1045 09/01/20 2108 09/02/20 0202 09/02/20 0203 09/02/20 1127  HGB 11.2*  --   --   --   --  10.9*  --   --  9.8*  --   --   HCT 35.0*  --   --   --   --  32.6*  --   --  29.7*  --   --   PLT 128*  --   --   --   --  107*  --   --  115*  --   --   HEPARINUNFRC  --    < >  --   --   --  1.56* 2.04* 1.00*  --  0.84*  --   CREATININE 1.87*  --   --   --   --  1.96*  --   --  1.96*  --   --   TROPONINIHS  --   --    < > 123* 140*  --   --   --   --   --  130*   < > = values in this interval not displayed.     Assessment: 65 yo female admitted with COVID-19 PNA, hx renal transplant, now with chest pain.  Received SQ heparin 7500 units TID for prophylaxis - last dose given at 1330 PM.  Pharmacy asked to dose IV heparin for r/o ACS.  CBC stable, large ecchymoses noted per MD from previous injections.  Initial heparin level 1.56 units/hr with follow-up 2.04.  Lab unable to draw from opposite arm, therefore high levels likely due to being drawn too close to the heparin infusion. Patient is a difficult lab draw per RN. Have discussed with primary MD and Cardiology to suggest possible change to lovenox for ACS. Will change heparin to enoxaparin per Dr. Sloan Leiter pending d/c of anitcoagulation per Cardiology as we are unable to get accurate blood draws.    Goal of Therapy:  Heparin level 0.3-0.7 units/ml Monitor platelets by anticoagulation protocol: Yes   Plan:  D/C heparin Start enoxaparin 90mg  SQ q24h (CrCl <30) F/u plans for [facility]  Nancyjo Givhan A. Levada Dy, PharmD, BCPS, [facility] Clinical Pharmacist [place] Please utilize Amion for appropriate phone number to reach the unit pharmacist ([place])   09/02/2020, 12:49 PM

## 2020-09-03 DIAGNOSIS — I248 Other forms of acute ischemic heart disease: Secondary | ICD-10-CM | POA: Diagnosis not present

## 2020-09-03 DIAGNOSIS — U071 COVID-19: Secondary | ICD-10-CM | POA: Diagnosis not present

## 2020-09-03 DIAGNOSIS — R072 Precordial pain: Secondary | ICD-10-CM | POA: Diagnosis not present

## 2020-09-03 DIAGNOSIS — J9601 Acute respiratory failure with hypoxia: Secondary | ICD-10-CM | POA: Diagnosis not present

## 2020-09-03 LAB — C-REACTIVE PROTEIN: CRP: 0.7 mg/dL (ref <1.0)

## 2020-09-03 LAB — COMPREHENSIVE METABOLIC PANEL
ALT: 285 U/L — ABNORMAL HIGH (ref 0–44)
AST: 111 U/L — ABNORMAL HIGH (ref 15–41)
Albumin: 2.6 g/dL — ABNORMAL LOW (ref 3.5–5.0)
Alkaline Phosphatase: 117 U/L (ref 38–126)
Anion gap: 10 (ref 5–15)
BUN: 61 mg/dL — ABNORMAL HIGH (ref 8–23)
CO2: 25 mmol/L (ref 22–32)
Calcium: 9.7 mg/dL (ref 8.9–10.3)
Chloride: 106 mmol/L (ref 98–111)
Creatinine, Ser: 2.04 mg/dL — ABNORMAL HIGH (ref 0.44–1.00)
GFR, Estimated: 27 mL/min — ABNORMAL LOW (ref >60)
Glucose, Bld: 143 mg/dL — ABNORMAL HIGH (ref 70–99)
Potassium: 4.4 mmol/L (ref 3.5–5.1)
Sodium: 141 mmol/L (ref 135–145)
Total Bilirubin: 0.7 mg/dL (ref 0.3–1.2)
Total Protein: 5 g/dL — ABNORMAL LOW (ref 6.5–8.1)

## 2020-09-03 LAB — CBC
HCT: 24.6 % — ABNORMAL LOW (ref 36.0–46.0)
Hemoglobin: 7.9 g/dL — ABNORMAL LOW (ref 12.0–15.0)
MCH: 25.2 pg — ABNORMAL LOW (ref 26.0–34.0)
MCHC: 32.1 g/dL (ref 30.0–36.0)
MCV: 78.3 fL — ABNORMAL LOW (ref 80.0–100.0)
Platelets: 103 10*3/uL — ABNORMAL LOW (ref 150–400)
RBC: 3.14 MIL/uL — ABNORMAL LOW (ref 3.87–5.11)
RDW: 16.3 % — ABNORMAL HIGH (ref 11.5–15.5)
WBC: 14 10*3/uL — ABNORMAL HIGH (ref 4.0–10.5)
nRBC: 0 % (ref 0.0–0.2)

## 2020-09-03 LAB — GLUCOSE, CAPILLARY
Glucose-Capillary: 57 mg/dL — ABNORMAL LOW (ref 70–99)
Glucose-Capillary: 73 mg/dL (ref 70–99)
Glucose-Capillary: 155 mg/dL — ABNORMAL HIGH (ref 70–99)
Glucose-Capillary: 255 mg/dL — ABNORMAL HIGH (ref 70–99)
Glucose-Capillary: 218 mg/dL — ABNORMAL HIGH (ref 70–99)

## 2020-09-03 LAB — D-DIMER, QUANTITATIVE: D-Dimer, Quant: 0.35 ug/mL-FEU (ref 0.00–0.50)

## 2020-09-03 MED ORDER — INSULIN DETEMIR 100 UNIT/ML ~~LOC~~ SOLN
14.0000 [IU] | Freq: Two times a day (BID) | SUBCUTANEOUS | Status: DC
  Administered 2020-09-03: 14 [IU] via SUBCUTANEOUS
  Filled 2020-09-03 (×3): qty 0.14

## 2020-09-03 MED ORDER — ENOXAPARIN SODIUM 30 MG/0.3ML ~~LOC~~ SOLN
30.0000 mg | SUBCUTANEOUS | Status: DC
  Administered 2020-09-03 – 2020-09-16 (×14): 30 mg via SUBCUTANEOUS
  Filled 2020-09-03 (×14): qty 0.3

## 2020-09-03 MED ORDER — INSULIN ASPART 100 UNIT/ML ~~LOC~~ SOLN
4.0000 [IU] | Freq: Three times a day (TID) | SUBCUTANEOUS | Status: DC
  Administered 2020-09-03 – 2020-09-09 (×12): 4 [IU] via SUBCUTANEOUS

## 2020-09-03 NOTE — Progress Notes (Addendum)
PROGRESS NOTE                                                                                                                                                                                                             Patient Demographics:    [name] [name], is a 64 y.o. female, DOB - February 15, 1956, NTZ:[id number]  Outpatient Primary MD for the patient is Glendale Chard, MD   Admit date - 08/18/2020   LOS - 62  Chief Complaint  Patient presents with  . Covid Positive  . Chest Pain  . Weakness       Brief Narrative: Patient is a 65 y.o. female with PMHx of renal transplant x2 (latest in 2016) on chronic immunosuppressive agents, DM-2, hypothyroidism-who presented with shortness of breath-found to have acute hypoxic respiratory failure due to COVID-19 pneumonia.  Hospital course complicated by slow improvement from severe hypoxemia, AKI and non-STEMI.  COVID-19 vaccinated status: Vaccinated including booster  Significant Events: 1/10>> Admit to [facility] for acute hypoxic respiratory failure due to COVID-19 pneumonia 1/11>> transfer to ICU due to worsening hypoxemia, AKI/metabolic acidosis/hyperkalemia. 1/14>> transferred to [facility] 1/23>> chest pain-non-STEMI-cardiology consult 1/25>> chest pain-resolved by SL nitroglycerin x1  Significant studies: 1/10>> chest x-ray: Severe multilobar bilateral pneumonia. 1/11>> ultrasound renal transplant: No hydronephrosis 1/16>> CT chest: Multifocal viral pneumonia 1/26>>Echo: EF> 70%, LVH, grade 1 diastolic dysfunction   [id number] medications: Steroids: 1/10>> Remdesivir: 1/10>>1/15 Baricitinib: 1/16>>  Antibiotics: Rocephin: 1/12>> 1/18  Microbiology data: 1/10 >>blood culture: No growth 1/11>> urine culture: Klebsiella pneumoniae  Procedures: None  Consults: PCCM, nephrology, cardiology  DVT prophylaxis: SCDs Start: 08/19/20 0933    Subjective:   Appears  unchanged-no further chest pain this morning.   Assessment  & Plan :   Acute Hypoxic Resp Failure due to Covid 19 Viral pneumonia: Overall much improved-at one point she was on heated high flow-she is on 3-4 L of oxygen at rest but requires significantly more with minimal ambulation.  Remains on tapering steroids and baricitinib.    Fever: afebrile O2 requirements:  SpO2: 96 % O2 Flow Rate (L/min): 5 L/min FiO2 (%): 40 %   COVID-19 Labs: Recent Labs    09/01/20 0022 09/02/20 0202 09/03/20 0059  DDIMER 0.53* 0.52* 0.35  CRP 0.6 0.9 0.7       Component Value Date/Time   BNP 235.3 (H)  08/31/2020 2212    No results for input(s): PROCALCITON in the last 168 hours.  Lab Results  Component Value Date   [place] NEGATIVE 04/27/2020   [place] Not Detected 01/30/2019     Prone/Incentive Spirometry: encouraged  incentive spirometry use 3-4/hour.  Sinus pause: Occurred on 1/20-approximately 4.25 seconds-this occurred while she was sleeping-after discussion with cardiology-beta-blocker discontinued.  Needs outpatient polysomnography.  Non-STEMI: Developed severe chest pain on 1/23 with EKG changes-relieved with nitroglycerin and morphine infusion-chest pain reoccurred on 1/25.  Echo is reassuring with preserved EF.  On aspirin/statin-has completed the 48 hours of IV heparin.  Cardiology following.    AKI on CKD stage IV-transplanted kidney: AKI hemodynamically mediated-creatinine improved but seems to have plateaued lately.  Creatinine slightly elevated today-watch closely-May need to reconsult nephrology.    History of renal transplant (second renal transplant-01/2015): Remains on Prograf-on steroids-Myfortic on hold.  Nephrology has signed off.  Hyperkalemia/anion gap metabolic acidosis: Secondary to AKI-has resolved.  Transaminitis: LFTs gradually rising-benign exam-May need to hold statins.  Anemia: Due to critical illness and CKD-watch closely-no evidence of blood  loss.  Thrombocytopenia: Mild-watch closely-no longer on heparin.  Complicated UTI: Completed a course of Rocephin.  Afebrile and nontoxic-appearing.  DKA: Resolved-no longer on insulin drip.  DM-2 (A1c 8.2 on 1/11) with uncontrolled hyperglycemia due to steroids: CBG with hypoglycemia-steroids also being tapered down-decrease Levemir to 14 units twice daily, 4 units of NovoLog with meals and SSI.     Recent Labs    09/03/20 0750 09/03/20 0832 09/03/20 1142  GLUCAP 57* 73 155*    Hypothyroidism: Continue levothyroxine-given suppressed TSH-I have decreased dosage to 50 mcg-repeat TSH in 3 months.  HTN: BP stable-continue manidipine and hydralazine.   HLD: Continue statin.  Obesity: Estimated body mass index is 35.56 kg/m as calculated from the following:   Height as of this encounter: 5\' 2"  (1.575 m).   Weight as of this encounter: 88.2 kg.   RN pressure injury documentation: Pressure Injury 08/20/20 Coccyx Mid Stage 1 -  Intact skin with non-blanchable redness of a localized area usually over a bony prominence. (Active)  08/20/20 0800  Location: Coccyx  Location Orientation: Mid  Staging: Stage 1 -  Intact skin with non-blanchable redness of a localized area usually over a bony prominence.  Wound Description (Comments):   Present on Admission: Yes    GI prophylaxis: PPI  ABG:    Component Value Date/Time   PHART 7.266 (L) 08/19/2020 0808   PCO2ART 32.3 08/19/2020 0808   PO2ART 186 (H) 08/19/2020 0808   HCO3 19.0 (L) 08/20/2020 1101   TCO2 16 (L) 08/19/2020 0808   ACIDBASEDEF 6.4 (H) 08/20/2020 1101   O2SAT 85.1 08/20/2020 1101    Vent Settings: N/A    Condition - Extremely Guarded  Family Communication  :  Daughter Mendel Ryder [phone] updated on 1/26  Code Status :  Full Code  Diet :  Diet Order            Diet Carb Modified Fluid consistency: Thin; Room service appropriate? Yes  Diet effective now                  Disposition Plan  :    Status is: Inpatient  Remains inpatient appropriate because:Inpatient level of care appropriate due to severity of illness   Dispo: The patient is from: Home              Anticipated d/c is to: SNF  Anticipated d/c date is: > 3 days              Patient currently is not medically stable to d/c.   Barriers to discharge: Hypoxia requiring O2 supplementation  Antimicorbials  :    Anti-infectives (From admission, onward)   Start     Dose/Rate Route Frequency Ordered Stop   08/20/20 1445  cefTRIAXone (ROCEPHIN) 1 g in sodium chloride 0.9 % 100 mL IVPB        1 g 200 mL/hr over 30 Minutes Intravenous Every 24 hours 08/20/20 1348 08/26/20 2147   08/20/20 1000  remdesivir 100 mg in sodium chloride 0.9 % 100 mL IVPB       "Followed by" Linked Group Details   100 mg 200 mL/hr over 30 Minutes Intravenous Daily 08/19/20 0021 08/23/20 1113   08/19/20 0130  remdesivir 200 mg in sodium chloride 0.9% 250 mL IVPB       "Followed by" Linked Group Details   200 mg 580 mL/hr over 30 Minutes Intravenous Once 08/19/20 0021 08/19/20 0314      Inpatient Medications  Scheduled Meds: . amLODipine  10 mg Oral Daily  . vitamin C  500 mg Oral Daily  . aspirin EC  81 mg Oral Daily  . atorvastatin  40 mg Oral Daily  . baricitinib  1 mg Oral Daily  . feeding supplement (NEPRO CARB STEADY)  237 mL Oral BID BM  . fluticasone  1 spray Each Nare Daily  . hydrALAZINE  25 mg Oral Q8H  . insulin aspart  0-15 Units Subcutaneous TID WC  . insulin aspart  6 Units Subcutaneous TID WC  . insulin detemir  18 Units Subcutaneous BID  . levothyroxine  50 mcg Oral Q0600  . mouth rinse  15 mL Mouth Rinse BID  . nitroGLYCERIN  0.5 inch Topical Q6H  . pantoprazole  40 mg Oral BID  . predniSONE  30 mg Oral Q breakfast  . sodium bicarbonate  650 mg Oral TID  . sodium zirconium cyclosilicate  10 g Oral BID  . tacrolimus  3 mg Oral QHS  . tacrolimus  4 mg Oral Daily  . zinc sulfate  220 mg Oral Daily    Continuous Infusions: . sodium chloride    . sodium chloride Stopped (08/31/20 1545)   PRN Meds:.sodium chloride, acetaminophen **OR** acetaminophen, guaiFENesin, hydrALAZINE, loperamide, morphine injection, nitroGLYCERIN, ondansetron (ZOFRAN) IV, polyethylene glycol, sodium chloride   Time Spent in minutes  25    See all Orders from today for further details   Oren Binet M.D on 09/03/2020 at 12:04 PM  To page go to [email or web address] - use universal password  [facility] -  Office  [phone]    Objective:   Vitals:   09/02/20 1958 09/02/20 2351 09/03/20 0402 09/03/20 0748  BP: (!) 119/56 (!) 102/59 129/64 133/66  Pulse: 100 100 100 100  Resp: (!) 21 20 20 20   Temp: 98.2 F (36.8 C) 97.6 F (36.4 C) (!) 97.2 F (36.2 C) (!) 97.4 F (36.3 C)  TempSrc: Oral Oral Axillary Oral  SpO2: 91% 96% 99% 96%  Weight:      Height:        Wt Readings from Last 3 Encounters:  09/02/20 88.2 kg  05/14/20 92.5 kg  05/14/20 92.9 kg     Intake/Output Summary (Last 24 hours) at 09/03/2020 1204 Last data filed at 09/03/2020 0300 Gross per 24 hour  Intake 120.33 ml  Output 500 ml  Net -379.67  ml     Physical Exam Gen Exam:Alert awake-not in any distress HEENT:atraumatic, normocephalic Chest: B/L clear to auscultation anteriorly CVS:S1S2 regular Abdomen:soft non tender, non distended Extremities:no edema Neurology: Non focal Skin: no rash  Data Review:    CBC Recent Labs  Lab 08/28/20 0551 08/29/20 0128 08/30/20 0502 08/31/20 0135 09/01/20 0022 09/02/20 0202 09/03/20 0059  WBC 11.8* 16.0* 15.5* 17.3* 23.0* 19.3* 14.0*  HGB 10.8* 10.9* 11.2* 11.2* 10.9* 9.8* 7.9*  HCT 33.0* 31.7* 34.2* 35.0* 32.6* 29.7* 24.6*  PLT 138* 146* 126* 128* 107* 115* 103*  MCV 75.0* 74.1* 76.3* 76.6* 76.7* 78.6* 78.3*  MCH 24.5* 25.5* 25.0* 24.5* 25.6* 25.9* 25.2*  MCHC 32.7 34.4 32.7 32.0 33.4 33.0 32.1  RDW 14.2 14.6 15.0 15.5 16.2* 16.5* 16.3*  LYMPHSABS 0.4* 0.4*  --    --   --   --   --   MONOABS 0.5 0.8  --   --   --   --   --   EOSABS 0.0 0.0  --   --   --   --   --   BASOSABS 0.0 0.1  --   --   --   --   --     Chemistries  Recent Labs  Lab 08/30/20 0502 08/31/20 0135 09/01/20 0022 09/02/20 0202 09/03/20 0059  NA 141 140 140 140 141  K 4.7 4.5 4.8 5.2* 4.4  CL 104 105 108 107 106  CO2 25 25 22 23 25   GLUCOSE 144* 162* 222* 185* 143*  BUN 71* 66* 59* 63* 61*  CREATININE 1.96* 1.87* 1.96* 1.96* 2.04*  CALCIUM 9.5 9.8 9.7 9.7 9.7  AST 35 47* 40 97* 111*  ALT 90* 118* 102* 249* 285*  ALKPHOS 82 92 74 102 117  BILITOT 0.7 0.9 0.5 0.6 0.7   ------------------------------------------------------------------------------------------------------------------ No results for input(s): CHOL, HDL, LDLCALC, TRIG, CHOLHDL, LDLDIRECT in the last 72 hours.  Lab Results  Component Value Date   HGBA1C 8.2 (H) 08/19/2020   ------------------------------------------------------------------------------------------------------------------ Recent Labs    09/02/20 0203  TSH 0.304*   ------------------------------------------------------------------------------------------------------------------ No results for input(s): VITAMINB12, FOLATE, FERRITIN, TIBC, IRON, RETICCTPCT in the last 72 hours.  Coagulation profile No results for input(s): INR, PROTIME in the last 168 hours.  Recent Labs    09/02/20 0202 09/03/20 0059  DDIMER 0.52* 0.35    Cardiac Enzymes No results for input(s): CKMB, TROPONINI, MYOGLOBIN in the last 168 hours.  Invalid input(s): CK ------------------------------------------------------------------------------------------------------------------    Component Value Date/Time   BNP 235.3 (H) 08/31/2020 2212    Micro Results No results found for this or any previous visit (from the past 240 hour(s)).  Radiology Reports CT CHEST WO CONTRAST  Result Date: 08/24/2020 CLINICAL DATA:  Severe hypoxia and respiratory failure  with COVID-19 infection. EXAM: CT CHEST WITHOUT CONTRAST TECHNIQUE: Multidetector CT imaging of the chest was performed following the standard protocol without IV contrast. COMPARISON:  Chest x-ray 08/18/2020 FINDINGS: Cardiovascular: Mild cardiomegaly. Mild calcification over the mitral valve annulus. Thoracic aorta is normal caliber. Stent over the right subclavian/axillary region. Remaining vascular structures are unremarkable. Mediastinum/Nodes: No evidence of mediastinal or hilar adenopathy. Remaining mediastinal structures are unremarkable. Lungs/Pleura: Lungs are adequately inflated demonstrate moderate bilateral hazy airspace process likely multifocal viral pneumonia in this COVID-19 positive patient. No evidence of effusion. Airways are otherwise unremarkable. Upper Abdomen: No acute findings. Musculoskeletal: Degenerative change of the spine. Mild anterior wedging of a midthoracic vertebral body likely chronic. IMPRESSION: 1. Moderate bilateral hazy  airspace process likely multifocal viral pneumonia in this COVID-19 positive patient. 2. Mild cardiomegaly. 3. Mild anterior wedging of a midthoracic vertebral body likely chronic. Electronically Signed   By: Marin Olp M.D.   On: 08/24/2020 12:37   US Renal Transplant w/Doppler  Result Date: 08/19/2020 CLINICAL DATA:  Acute kidney injury EXAM: ULTRASOUND OF RENAL TRANSPLANT WITH RENAL DOPPLER ULTRASOUND TECHNIQUE: Ultrasound examination of the renal transplant was performed with gray-scale, color and duplex doppler evaluation. COMPARISON:  None. FINDINGS: Transplant kidney location: Left lower quadrant Transplant Kidney: Renal measurements: 10.8 x 6.2 x 6.5 cm = volume: 291mL. Normal in size and parenchymal echogenicity. No evidence of mass or hydronephrosis. No peri-transplant fluid collection seen. Color flow in the main renal artery:  Yes Color flow in the main renal vein:  Yes Duplex Doppler Evaluation: Main Renal Artery Velocity:  cm/sec Main Renal  Artery Resistive Index: 0.99 Venous waveform in main renal vein:  Present Intrarenal resistive index in upper pole:  0.98 (normal 0.6-0.8; equivocal 0.8-0.9; abnormal >= 0.9) Intrarenal resistive index in lower pole: 0.98 (normal 0.6-0.8; equivocal 0.8-0.9; abnormal >= 0.9) Bladder: Normal for degree of bladder distention. Other findings:  None. IMPRESSION: Elevated resistive indices within the left lower quadrant transplant kidney. No hydronephrosis. Electronically Signed   By: Rolm Baptise M.D.   On: 08/19/2020 19:15   DG CHEST PORT 1 VIEW  Result Date: 08/31/2020 CLINICAL DATA:  64 year old female with shortness of breath. EXAM: PORTABLE CHEST 1 VIEW COMPARISON:  Chest radiograph dated 08/18/2020. FINDINGS: Bilateral confluent and reticular densities, progressed since the prior radiograph and most consistent with multifocal pneumonia. No pleural effusion pneumothorax. Borderline cardiomegaly. No acute osseous pathology. IMPRESSION: Interval progression of bilateral pulmonary opacities compared to the radiograph of 08/18/2020. Continued follow-up recommended. Electronically Signed   By: Anner Crete M.D.   On: 08/31/2020 21:17   DG Chest Portable 1 View  Result Date: 08/18/2020 CLINICAL DATA:  66 year old female with history of COVID infection. Chest pain. Shortness of breath. EXAM: PORTABLE CHEST 1 VIEW COMPARISON:  Chest x-ray 10/20/2014. FINDINGS: Patchy multifocal airspace consolidation and areas of interstitial prominence are noted throughout the lungs bilaterally, most severe throughout the periphery of the mid to lower left lung, indicative of multilobar bilateral pneumonia. No definite pleural effusions. No evidence of pulmonary edema. No pneumothorax. Heart size is borderline enlarged. Upper mediastinal contours are within normal limits. Atherosclerotic calcifications in the thoracic aorta. Vascular stent projecting over the right subclavian region. IMPRESSION: 1. Severe multilobar bilateral  (left greater than right) pneumonia compatible with reported COVID infection. 2. Aortic atherosclerosis. Electronically Signed   By: Vinnie Langton M.D.   On: 08/18/2020 18:02   ECHOCARDIOGRAM LIMITED  Result Date: 09/02/2020    ECHOCARDIOGRAM LIMITED REPORT   Patient Name:   [facility] Date of Exam: 09/02/2020 Medical Rec #:  [id number]               Height:       62.0 in Accession #:    [id number]              Weight:       194.4 lb Date of Birth:  14-Sep-1955              BSA:          1.889 m Patient Age:    1 years                BP:  135/66 mmHg Patient Gender: F                       HR:           100 bpm. Exam Location:  Inpatient Procedure: Limited Echo, Limited Color Doppler and Cardiac Doppler Indications:    chest pain  History:        Patient has prior history of Echocardiogram examinations, most                 recent 09/30/2006. Covid; Risk Factors:Diabetes and Hypertension.  Sonographer:    Johny Chess Referring Phys: [place]  1. Left ventricular ejection fraction, by estimation, is >75%. The left ventricle has hyperdynamic function. The left ventricle has no regional wall motion abnormalities. There is moderate left ventricular hypertrophy. Left ventricular diastolic parameters are consistent with Grade I diastolic dysfunction (impaired relaxation).  2. Right ventricular systolic function is normal. The right ventricular size is normal.  3. The mitral valve is normal in structure. No evidence of mitral valve regurgitation. No evidence of mitral stenosis. Moderate mitral annular calcification.  4. The aortic valve is tricuspid. Aortic valve regurgitation is not visualized. Mild aortic valve sclerosis is present, with no evidence of aortic valve stenosis.  5. The inferior vena cava is normal in size with greater than 50% respiratory variability, suggesting right atrial pressure of 3 mmHg. FINDINGS  Left Ventricle: Left ventricular ejection  fraction, by estimation, is >75%. The left ventricle has hyperdynamic function. The left ventricle has no regional wall motion abnormalities. The left ventricular internal cavity size was normal in size. There is moderate left ventricular hypertrophy. Left ventricular diastolic parameters are consistent with Grade I diastolic dysfunction (impaired relaxation). Right Ventricle: The right ventricular size is normal. Right ventricular systolic function is normal. Left Atrium: Left atrial size was normal in size. Right Atrium: Right atrial size was normal in size. Pericardium: There is no evidence of pericardial effusion. Mitral Valve: The mitral valve is normal in structure. Moderate mitral annular calcification. No evidence of mitral valve stenosis. Tricuspid Valve: The tricuspid valve is normal in structure. Tricuspid valve regurgitation is trivial. No evidence of tricuspid stenosis. Aortic Valve: The aortic valve is tricuspid. Aortic valve regurgitation is not visualized. Mild aortic valve sclerosis is present, with no evidence of aortic valve stenosis. Pulmonic Valve: The pulmonic valve was normal in structure. Pulmonic valve regurgitation is not visualized. No evidence of pulmonic stenosis. Aorta: The aortic root is normal in size and structure. Venous: The inferior vena cava is normal in size with greater than 50% respiratory variability, suggesting right atrial pressure of 3 mmHg. IAS/Shunts: No atrial level shunt detected by color flow Doppler. LEFT VENTRICLE PLAX 2D LVIDd:         3.70 cm  Diastology LVIDs:         2.50 cm  LV e' medial:    5.33 cm/s LV PW:         1.00 cm  LV E/e' medial:  13.8 LV IVS:        0.90 cm  LV e' lateral:   5.66 cm/s LVOT diam:     1.70 cm  LV E/e' lateral: 13.0 LV SV:         59 LV SV Index:   31 LVOT Area:     2.27 cm  IVC IVC diam: 1.30 cm LEFT ATRIUM         Index LA diam:  3.90 cm 2.06 cm/m  AORTIC VALVE LVOT Vmax:   170.00 cm/s LVOT Vmean:  114.000 cm/s LVOT VTI:    0.258  m  AORTA Ao Asc diam: 2.60 cm MV E velocity: 73.50 cm/s MV A velocity: 121.00 cm/s  SHUNTS MV E/A ratio:  0.61         Systemic VTI:  0.26 m                             Systemic Diam: 1.70 cm Kirk Ruths MD Electronically signed by Kirk Ruths MD Signature Date/Time: 09/02/2020/12:26:37 PM    Final

## 2020-09-03 NOTE — Progress Notes (Signed)
Progress Note  Patient Name: [name] [name] Date of Encounter: 09/03/2020  Primary Cardiologist: Dr. Harrell Gave  Subjective   No further chest pain. Gets winded just walking between bed and bathroom. Noted that her renal function and blood counts are worse today. Denies any apparent bleeding.  Inpatient Medications    Scheduled Meds: . amLODipine  10 mg Oral Daily  . vitamin C  500 mg Oral Daily  . aspirin EC  81 mg Oral Daily  . atorvastatin  40 mg Oral Daily  . baricitinib  1 mg Oral Daily  . feeding supplement (NEPRO CARB STEADY)  237 mL Oral BID BM  . fluticasone  1 spray Each Nare Daily  . hydrALAZINE  25 mg Oral Q8H  . insulin aspart  0-15 Units Subcutaneous TID WC  . insulin aspart  6 Units Subcutaneous TID WC  . insulin detemir  18 Units Subcutaneous BID  . levothyroxine  50 mcg Oral Q0600  . mouth rinse  15 mL Mouth Rinse BID  . nitroGLYCERIN  0.5 inch Topical Q6H  . pantoprazole  40 mg Oral BID  . predniSONE  30 mg Oral Q breakfast  . sodium bicarbonate  650 mg Oral TID  . sodium zirconium cyclosilicate  10 g Oral BID  . tacrolimus  3 mg Oral QHS  . tacrolimus  4 mg Oral Daily  . zinc sulfate  220 mg Oral Daily   Continuous Infusions: . sodium chloride    . sodium chloride Stopped (08/31/20 1545)   PRN Meds: sodium chloride, acetaminophen **OR** acetaminophen, guaiFENesin, hydrALAZINE, loperamide, morphine injection, nitroGLYCERIN, ondansetron (ZOFRAN) IV, polyethylene glycol, sodium chloride   Vital Signs    Vitals:   09/02/20 1958 09/02/20 2351 09/03/20 0402 09/03/20 0748  BP: (!) 119/56 (!) 102/59 129/64 133/66  Pulse: 100 100 100 100  Resp: (!) 21 20 20 20   Temp: 98.2 F (36.8 C) 97.6 F (36.4 C) (!) 97.2 F (36.2 C) (!) 97.4 F (36.3 C)  TempSrc: Oral Oral Axillary Oral  SpO2: 91% 96% 99% 96%  Weight:      Height:        Intake/Output Summary (Last 24 hours) at 09/03/2020 1210 Last data filed at 09/03/2020 0300 Gross per 24 hour   Intake 120.33 ml  Output 500 ml  Net -379.67 ml   Last 3 Weights 09/02/2020 09/01/2020 08/31/2020  Weight (lbs) 194 lb 7.1 oz 193 lb 12.6 oz 190 lb 0.6 oz  Weight (kg) 88.2 kg 87.9 kg 86.2 kg     Telemetry    NSR/sinus tach - Personally Reviewed  ECG    1/25 sinus tachycardia, unchanged ST pattern - Personally Reviewed  Physical Exam   GEN: Well nourished, well developed in no acute distress. HFNC in place NECK: No JVD CARDIAC: regular rhythm, normal S1 and S2, no rubs or gallops. No murmur. VASCULAR: Radial pulses 2+ bilaterally.  RESPIRATORY:  Clear to auscultation without rales, wheezing or rhonchi  ABDOMEN: Soft, non-tender, non-distended MUSCULOSKELETAL:  Moves all 4 limbs independently SKIN: Warm and dry, no edema NEUROLOGIC:  No focal neuro deficits noted. PSYCHIATRIC:  Normal affect   Labs    High Sensitivity Troponin:   Recent Labs  Lab 08/31/20 1450 08/31/20 1815 08/31/20 2212 09/02/20 1127 09/02/20 1942  TROPONINIHS 117* 123* 140* 130* 99*      Cardiac EnzymesNo results for input(s): TROPONINI in the last 168 hours. No results for input(s): TROPIPOC in the last 168 hours.   Chemistry Recent Labs  Lab 09/01/20 0022 09/02/20 0202 09/03/20 0059  NA 140 140 141  K 4.8 5.2* 4.4  CL 108 107 106  CO2 22 23 25   GLUCOSE 222* 185* 143*  BUN 59* 63* 61*  CREATININE 1.96* 1.96* 2.04*  CALCIUM 9.7 9.7 9.7  PROT 6.0* 5.7* 5.0*  ALBUMIN 2.8* 2.8* 2.6*  AST 40 97* 111*  ALT 102* 249* 285*  ALKPHOS 74 102 117  BILITOT 0.5 0.6 0.7  GFRNONAA 28* 28* 27*  ANIONGAP 10 10 10      Hematology Recent Labs  Lab 09/01/20 0022 09/02/20 0202 09/03/20 0059  WBC 23.0* 19.3* 14.0*  RBC 4.25 3.78* 3.14*  HGB 10.9* 9.8* 7.9*  HCT 32.6* 29.7* 24.6*  MCV 76.7* 78.6* 78.3*  MCH 25.6* 25.9* 25.2*  MCHC 33.4 33.0 32.1  RDW 16.2* 16.5* 16.3*  PLT 107* 115* 103*    BNP Recent Labs  Lab 08/31/20 2212  BNP 235.3*     DDimer  Recent Labs  Lab 09/01/20 0022  09/02/20 0202 09/03/20 0059  DDIMER 0.53* 0.52* 0.35     Radiology    ECHOCARDIOGRAM LIMITED  Result Date: 09/02/2020    ECHOCARDIOGRAM LIMITED REPORT   Patient Name:   [name] [name] Date of Exam: 09/02/2020 Medical Rec #:  [id number]               Height:       62.0 in Accession #:    [id number]              Weight:       194.4 lb Date of Birth:  [birth year]              BSA:          1.889 m Patient Age:    65 years                BP:           135/66 mmHg Patient Gender: F                       HR:           100 bpm. Exam Location:  Inpatient Procedure: Limited Echo, Limited Color Doppler and Cardiac Doppler Indications:    chest pain  History:        Patient has prior history of Echocardiogram examinations, most                 recent 09/30/2006. Covid; Risk Factors:Diabetes and Hypertension.  Sonographer:    [name] [name] Referring Phys: [place]  1. Left ventricular ejection fraction, by estimation, is >75%. The left ventricle has hyperdynamic function. The left ventricle has no regional wall motion abnormalities. There is moderate left ventricular hypertrophy. Left ventricular diastolic parameters are consistent with Grade I diastolic dysfunction (impaired relaxation).  2. Right ventricular systolic function is normal. The right ventricular size is normal.  3. The mitral valve is normal in structure. No evidence of mitral valve regurgitation. No evidence of mitral stenosis. Moderate mitral annular calcification.  4. The aortic valve is tricuspid. Aortic valve regurgitation is not visualized. Mild aortic valve sclerosis is present, with no evidence of aortic valve stenosis.  5. The inferior vena cava is normal in size with greater than 50% respiratory variability, suggesting right atrial pressure of 3 mmHg. FINDINGS  Left Ventricle: Left ventricular ejection fraction, by estimation, is >75%. The left ventricle has hyperdynamic function. The left ventricle has  no  regional wall motion abnormalities. The left ventricular internal cavity size was normal in size. There is moderate left ventricular hypertrophy. Left ventricular diastolic parameters are consistent with Grade I diastolic dysfunction (impaired relaxation). Right Ventricle: The right ventricular size is normal. Right ventricular systolic function is normal. Left Atrium: Left atrial size was normal in size. Right Atrium: Right atrial size was normal in size. Pericardium: There is no evidence of pericardial effusion. Mitral Valve: The mitral valve is normal in structure. Moderate mitral annular calcification. No evidence of mitral valve stenosis. Tricuspid Valve: The tricuspid valve is normal in structure. Tricuspid valve regurgitation is trivial. No evidence of tricuspid stenosis. Aortic Valve: The aortic valve is tricuspid. Aortic valve regurgitation is not visualized. Mild aortic valve sclerosis is present, with no evidence of aortic valve stenosis. Pulmonic Valve: The pulmonic valve was normal in structure. Pulmonic valve regurgitation is not visualized. No evidence of pulmonic stenosis. Aorta: The aortic root is normal in size and structure. Venous: The inferior vena cava is normal in size with greater than 50% respiratory variability, suggesting right atrial pressure of 3 mmHg. IAS/Shunts: No atrial level shunt detected by color flow Doppler. LEFT VENTRICLE PLAX 2D LVIDd:         3.70 cm  Diastology LVIDs:         2.50 cm  LV e' medial:    5.33 cm/s LV PW:         1.00 cm  LV E/e' medial:  13.8 LV IVS:        0.90 cm  LV e' lateral:   5.66 cm/s LVOT diam:     1.70 cm  LV E/e' lateral: 13.0 LV SV:         59 LV SV Index:   31 LVOT Area:     2.27 cm  IVC IVC diam: 1.30 cm LEFT ATRIUM         Index LA diam:    3.90 cm 2.06 cm/m  AORTIC VALVE LVOT Vmax:   170.00 cm/s LVOT Vmean:  114.000 cm/s LVOT VTI:    0.258 m  AORTA Ao Asc diam: 2.60 cm MV E velocity: 73.50 cm/s MV A velocity: 121.00 cm/s  SHUNTS MV E/A ratio:   0.61         Systemic VTI:  0.26 m                             Systemic Diam: 1.70 cm Kirk Ruths MD Electronically signed by Kirk Ruths MD Signature Date/Time: 09/02/2020/12:26:37 PM    Final     Cardiac Studies   Echo 09/02/20 1. Left ventricular ejection fraction, by estimation, is >75%. The left  ventricle has hyperdynamic function. The left ventricle has no regional  wall motion abnormalities. There is moderate left ventricular hypertrophy.  Left ventricular diastolic  parameters are consistent with Grade I diastolic dysfunction (impaired  relaxation).  2. Right ventricular systolic function is normal. The right ventricular  size is normal.  3. The mitral valve is normal in structure. No evidence of mitral valve  regurgitation. No evidence of mitral stenosis. Moderate mitral annular  calcification.  4. The aortic valve is tricuspid. Aortic valve regurgitation is not  visualized. Mild aortic valve sclerosis is present, with no evidence of  aortic valve stenosis.  5. The inferior vena cava is normal in size with greater than 50%  respiratory variability, suggesting right atrial pressure of 3 mmHg.  Patient Profile     65 y.o. vaccinated (+boosted) female with CKD stage IV with prior history of renal transplant x 2 most recently in 2016 on chronic immunosuppressant therapy, HTN, HLD, DM2, hypothyroidism, gout, sickle cell trait developed symptoms concerning for Covid on 08/06/21. Cardiology is following for chest pain episode on 08/31/20 with troponin elevation to 140.   Assessment & Plan    Chest pain with elevated troponin/concern for NSTEMI - 2D echo with hyperdynamic function, no regional wall motion abnormalities - continue aspirin, statin. LFTs trending up slightly, may need to hold statin if these continue to worsen - with prior sinus pause, no beta blocker - echo and troponin pattern more consistent with demand ischemia. hsTnI trend 117, 123, 140, 130,  99. -completed 48 hours of heparin -no further pain. -will arrange for outpatient follow up  Essential HTN - well controlled, continue current medications  Hyperlipidemia - simvastatin changed to atorvastatin this admission - monitor rising LFTs, may need to hold if they continue to rise  Per primary team and nephrology: Acute hypoxic respiratory failure secondary to Covid-19 PNA Acute on CKD stage IV s/p renal transplant x2 --> Cr worsening today, 2.04 Hypothyroidism Anemia-->drifting down from her chronic anemia baseline, 7.9 today Elevated transaminases--> rising today  [facility] will sign off.   Medication Recommendations:  As above, continue aspirin and atorvastatin. If LFTs continue to rise, may need to hold statin until they normalize Other recommendations (labs, testing, etc):  none Follow up as an outpatient:  We will arrange for her to see me in the office in about 4 weeks.  For questions or updates, please contact [place] Please consult [email or web address] for contact info under Cardiology/STEMI.  Signed, Buford Dresser, MD 09/03/2020, 12:10 PM

## 2020-09-03 NOTE — Progress Notes (Signed)
Inpatient Diabetes Program Recommendations  AACE/ADA: New Consensus Statement on Inpatient Glycemic Control (2015)  Target Ranges:  Prepandial:   less than 140 mg/dL      Peak postprandial:   less than 180 mg/dL (1-2 hours)      Critically ill patients:  140 - 180 mg/dL   Lab Results  Component Value Date   GLUCAP 73 09/03/2020   HGBA1C 8.2 (H) 08/19/2020    Review of Glycemic Control Results for LILLITH, MCNEFF WHITE (MRN [id number]) as of 09/03/2020 10:24  Ref. Range 09/02/2020 20:37 09/03/2020 07:50 09/03/2020 08:32  Glucose-Capillary Latest Ref Range: 70 - 99 mg/dL 244 (H) 57 (L) 73   Diabetes history: DM 2 Outpatient Diabetes medications: Levemir 30 units q AM and Levemir 35 units q HS, Novolog 4 units tid with meals Current orders for Inpatient glycemic control:  Levemir 18 units bid Novolog 0-15 units tid Novolog 6 units tid meal coverage  Nepro bid PO prednisone 30 mg Daily  Inpatient Diabetes Program Recommendations:    Noted hypoglycemia this am of 57 mg/dL; steroids also tapered today.   Consider reducing Levemir to 16 units BID.   Thanks, Bronson Curb, MSN, RNC-OB Diabetes Coordinator [phone] (8a-5p)

## 2020-09-03 NOTE — Progress Notes (Signed)
Hypoglycemic Event  CBG: 57  Treatment: 8oz juice PO  Symptoms: No hypoglycemic symptoms noted  Follow-up CBG: [id number] CBG Result:73  Possible Reasons for Event: Pt stated she did not eat any dinner last night.   Comments/MD notified: Dr.Ghimire notified per protocol.    Jerad Dunlap Luz Lex

## 2020-09-04 ENCOUNTER — Telehealth: Payer: Self-pay | Admitting: *Deleted

## 2020-09-04 DIAGNOSIS — U071 COVID-19: Secondary | ICD-10-CM | POA: Diagnosis not present

## 2020-09-04 DIAGNOSIS — R072 Precordial pain: Secondary | ICD-10-CM | POA: Diagnosis not present

## 2020-09-04 DIAGNOSIS — J9601 Acute respiratory failure with hypoxia: Secondary | ICD-10-CM | POA: Diagnosis not present

## 2020-09-04 DIAGNOSIS — I248 Other forms of acute ischemic heart disease: Secondary | ICD-10-CM | POA: Diagnosis not present

## 2020-09-04 LAB — COMPREHENSIVE METABOLIC PANEL
ALT: 220 U/L — ABNORMAL HIGH (ref 0–44)
AST: 56 U/L — ABNORMAL HIGH (ref 15–41)
Albumin: 2.7 g/dL — ABNORMAL LOW (ref 3.5–5.0)
Alkaline Phosphatase: 111 U/L (ref 38–126)
Anion gap: 9 (ref 5–15)
BUN: 53 mg/dL — ABNORMAL HIGH (ref 8–23)
CO2: 26 mmol/L (ref 22–32)
Calcium: 9.6 mg/dL (ref 8.9–10.3)
Chloride: 105 mmol/L (ref 98–111)
Creatinine, Ser: 2.11 mg/dL — ABNORMAL HIGH (ref 0.44–1.00)
GFR, Estimated: 26 mL/min — ABNORMAL LOW (ref >60)
Glucose, Bld: 66 mg/dL — ABNORMAL LOW (ref 70–99)
Potassium: 4 mmol/L (ref 3.5–5.1)
Sodium: 140 mmol/L (ref 135–145)
Total Bilirubin: 1 mg/dL (ref 0.3–1.2)
Total Protein: 5.2 g/dL — ABNORMAL LOW (ref 6.5–8.1)

## 2020-09-04 LAB — GLUCOSE, CAPILLARY
Glucose-Capillary: 46 mg/dL — ABNORMAL LOW (ref 70–99)
Glucose-Capillary: 77 mg/dL (ref 70–99)
Glucose-Capillary: 140 mg/dL — ABNORMAL HIGH (ref 70–99)
Glucose-Capillary: 152 mg/dL — ABNORMAL HIGH (ref 70–99)
Glucose-Capillary: 166 mg/dL — ABNORMAL HIGH (ref 70–99)

## 2020-09-04 LAB — CBC
HCT: 23.1 % — ABNORMAL LOW (ref 36.0–46.0)
Hemoglobin: 7.6 g/dL — ABNORMAL LOW (ref 12.0–15.0)
MCH: 25.5 pg — ABNORMAL LOW (ref 26.0–34.0)
MCHC: 32.9 g/dL (ref 30.0–36.0)
MCV: 77.5 fL — ABNORMAL LOW (ref 80.0–100.0)
Platelets: 101 10*3/uL — ABNORMAL LOW (ref 150–400)
RBC: 2.98 MIL/uL — ABNORMAL LOW (ref 3.87–5.11)
RDW: 16.4 % — ABNORMAL HIGH (ref 11.5–15.5)
WBC: 11.8 10*3/uL — ABNORMAL HIGH (ref 4.0–10.5)
nRBC: 0.3 % — ABNORMAL HIGH (ref 0.0–0.2)

## 2020-09-04 LAB — D-DIMER, QUANTITATIVE: D-Dimer, Quant: <0.27 ug/mL-FEU (ref 0.00–0.50)

## 2020-09-04 LAB — C-REACTIVE PROTEIN: CRP: 0.6 mg/dL (ref <1.0)

## 2020-09-04 MED ORDER — INSULIN DETEMIR 100 UNIT/ML ~~LOC~~ SOLN
14.0000 [IU] | Freq: Every morning | SUBCUTANEOUS | Status: DC
  Administered 2020-09-05 – 2020-09-09 (×5): 14 [IU] via SUBCUTANEOUS
  Filled 2020-09-04 (×6): qty 0.14

## 2020-09-04 MED ORDER — INSULIN DETEMIR 100 UNIT/ML ~~LOC~~ SOLN
7.0000 [IU] | Freq: Every day | SUBCUTANEOUS | Status: DC
  Administered 2020-09-04 – 2020-09-09 (×6): 7 [IU] via SUBCUTANEOUS
  Filled 2020-09-04 (×8): qty 0.07

## 2020-09-04 NOTE — Telephone Encounter (Signed)
-----   Message from Tommie Raymond, NP sent at 09/03/2020  4:18 PM EST ----- Regarding: appointment This patient is currently hospitalized however Dr. Harrell Gave is asking to schedule her for follow up with her in approximately 4 weeks. Please schedule the patient and call her to let her know the date and time of appointment once scheduled.   Thank you  Kathyrn Drown

## 2020-09-04 NOTE — Progress Notes (Signed)
Hypoglycemic Event  CBG: 46  Treatment: 8oz juice PO  Symptoms: none noted  Follow-up CBG: Time: 0752 CBG Result: 77  Comments/MD notified: Dr.Ghimire notified per protocol    Jenea Dake Luz Lex

## 2020-09-04 NOTE — Telephone Encounter (Signed)
The patient is still currently admitted as of today. To scheduling for appointment.

## 2020-09-04 NOTE — Progress Notes (Signed)
Physical Therapy Treatment Patient Details Name: [name] [name] MRN: [id number] DOB: 02-23-1956 Today's Date: 09/04/2020    History of Present Illness Pt is a 65 y.o. female who tested (+) COVID-19 on 08/13/20, now admitted 08/18/20 with worsening SOB, cough, chest pain and body aches. Workup for acute hypoxic respiratory failure due to COVID-19 PNA, DKA, AKI on CKD, UTI. PMH includes HTN, DM2, chronic steroid use, s/p renal transplant x2.Episode of CP 1/22; per Cardiology: troponin, EKG and history felt concerning for [place] / possibly NSTEMI - difficult to know in context of multiple medical stressors    PT Comments    Pt had just finished her lunch and was lying supine in flattened bed. Pt stated she felt it was easier to breath lying flat. When introduced as therapy to pt her RR increased to 48 bpm, and HR increased to 120s. Pt reports pain in chest and shoulders at 5/10. Pt with obvious anxiety about moving with therapy. PT able to utilized guided relaxation techniques to slow breathing and improve relaxation. Educated pt on various techniques to reduce anxiety with movement. At end of session RR 23 bpm, HR 98, pain 0/10.  Informed pt PT would be back tomorrow to work on mobility. Pt in agreement.     Follow Up Recommendations  SNF     Equipment Recommendations  Rolling walker with 5" wheels;3in1 (PT)       Precautions / Restrictions Precautions Precaution Comments: Watch SpO2 - quick to desaturate on HHFNC Restrictions Weight Bearing Restrictions: No    Mobility  Bed Mobility  Deferred today                                     Cognition Arousal/Alertness: Awake/alert Behavior During Therapy: WFL for tasks assessed/performed;Flat affect Overall Cognitive Status: Within Functional Limits for tasks assessed                                        Exercises Other Exercises Other Exercises: guided deep breathing for reducing RR and relaxing  muscle tension    General Comments General comments (skin integrity, edema, etc.): Pt on 7L O2 via HFNC with SaO2 92-97%O2,      Pertinent Vitals/Pain Pain Assessment: 0-10 Pain Score: 5  Pain Location: chest and shoulders with sitting up in bed Pain Descriptors / Indicators: Discomfort Pain Intervention(s): Monitored during session;Repositioned;Utilized relaxation techniques           PT Goals (current goals can now be found in the care plan section) Acute Rehab PT Goals Patient Stated Goal: Hopeful for return home, but willing to consider post-acute rehab at SNF PT Goal Formulation: With patient Time For Goal Achievement: 09/05/20 Potential to Achieve Goals: Good Progress towards PT goals: Not progressing toward goals - comment (increased RR and anxiety with mention of mobility)    Frequency    Min 2X/week      PT Plan Current plan remains appropriate    Co-evaluation              AM-PAC PT "6 Clicks" Mobility   Outcome Measure  Help needed turning from your back to your side while in a flat bed without using bedrails?: A Little Help needed moving from lying on your back to sitting on the side of a flat bed without using bedrails?: A  Little Help needed moving to and from a bed to a chair (including a wheelchair)?: A Little Help needed standing up from a chair using your arms (e.g., wheelchair or bedside chair)?: A Little Help needed to walk in hospital room?: A Lot Help needed climbing 3-5 steps with a railing? : A Lot 6 Click Score: 16    End of Session Equipment Utilized During Treatment: Oxygen Activity Tolerance: Patient limited by fatigue Patient left: with call bell/phone within reach;in bed;with bed alarm set Nurse Communication: Mobility status PT Visit Diagnosis: Other abnormalities of gait and mobility (R26.89);Muscle weakness (generalized) (M62.81)     Time: 3435-6861 PT Time Calculation (min) (ACUTE ONLY): 16 min  Charges:  $Therapeutic  Exercise: 8-22 mins                     Amarri Satterly B. Migdalia Dk PT, DPT Acute Rehabilitation Services Pager [phone] Office [phone]    [place] 09/04/2020, 2:45 PM

## 2020-09-04 NOTE — Progress Notes (Signed)
PROGRESS NOTE                                                                                                                                                                                                             Patient Demographics:    [name] [name], is a 65 y.o. female, DOB - [birth year], NID:[id number]  Outpatient Primary MD for the patient is Glendale Chard, MD   Admit date - 08/18/2020   LOS - 59  Chief Complaint  Patient presents with  . Covid Positive  . Chest Pain  . Weakness       Brief Narrative: Patient is a 65 y.o. female with PMHx of renal transplant x2 (latest in 2016) on chronic immunosuppressive agents, DM-2, hypothyroidism-who presented with shortness of breath-found to have acute hypoxic respiratory failure due to COVID-19 pneumonia.  Hospital course complicated by slow improvement from severe hypoxemia, AKI and non-STEMI.  COVID-19 vaccinated status: Vaccinated including booster  Significant Events: 1/10>> Admit to [facility] for acute hypoxic respiratory failure due to COVID-19 pneumonia 1/11>> transfer to ICU due to worsening hypoxemia, AKI/metabolic acidosis/hyperkalemia. 1/14>> transferred to [facility] 1/23>> chest pain-non-STEMI-cardiology consult 1/25>> chest pain-resolved by SL nitroglycerin x1  Significant studies: 1/10>> chest x-ray: Severe multilobar bilateral pneumonia. 1/11>> ultrasound renal transplant: No hydronephrosis 1/16>> CT chest: Multifocal viral pneumonia 1/26>>Echo: EF> 70%, LVH, grade 1 diastolic dysfunction   [id number] medications: Steroids: 1/10>> Remdesivir: 1/10>>1/15 Baricitinib: 1/16>>  Antibiotics: Rocephin: 1/12>> 1/18  Microbiology data: 1/10 >>blood culture: No growth 1/11>> urine culture: Klebsiella pneumoniae  Procedures: None  Consults: PCCM, nephrology, cardiology  DVT prophylaxis: enoxaparin (LOVENOX) injection 30 mg Start: 09/03/20 1400 SCDs  Start: 08/19/20 0933    Subjective:   No major acute events overnight-lying comfortably in bed.  Had light brown-colored stools yesterday   Assessment  & Plan :   Acute Hypoxic Resp Failure due to Covid 19 Viral pneumonia: Slowly improving-titrate down to 3 L of oxygen at rest-but requires anywhere from 7-8 L of oxygen with minimal ambulation.  Continue tapering steroids-remains on baricitinib.  No signs of volume overload on my exam.  Fever: afebrile O2 requirements:  SpO2: 95 % O2 Flow Rate (L/min): 7 L/min FiO2 (%): 40 %   COVID-19 Labs: Recent Labs    09/02/20 0202 09/03/20 0059 09/04/20 0131  DDIMER 0.52* 0.35 <0.27  CRP 0.9 0.7  0.6       Component Value Date/Time   BNP 235.3 (H) 08/31/2020 2212    No results for input(s): PROCALCITON in the last 168 hours.  Lab Results  Component Value Date   [place] NEGATIVE 04/27/2020   [place] Not Detected 01/30/2019     Prone/Incentive Spirometry: encouraged  incentive spirometry use 3-4/hour.  Sinus pause: Occurred on 1/20-approximately 4.25 seconds-this occurred while she was sleeping-after discussion with cardiology-beta-blocker discontinued.  Needs outpatient polysomnography.  Non-STEMI: Developed severe chest pain on 1/23 with EKG changes-relieved with nitroglycerin and morphine infusion-chest pain reoccurred on 1/25.  Echo is reassuring with preserved EF.  On aspirin/statin-has completed the 48 hours of IV heparin.  Cardiology following.    AKI on CKD stage IV-transplanted kidney (second renal transplant-01/2015): AKI hemodynamically mediated-creatinine improved but seems to have plateaued lately.  Creatinine slowly uptrending--reconsult nephrology.  On tapering prednisone-remains on Prograf-Myfortic remains on hold.  Hyperkalemia/anion gap metabolic acidosis: Secondary to AKI-has resolved.  Transaminitis: LFTs gradually getting better-continue to monitor closely-watch closely as on statins.    Anemia: Due to  critical illness and CKD-watch closely-no evidence of blood loss-had brown-colored stools yesterday per patient.  Transfuse if less than 7.  Thrombocytopenia: Mild-watch closely-no longer on therapeutic anticoagulation.  Complicated UTI: Completed a course of Rocephin.  Afebrile and nontoxic-appearing.  DKA: Resolved-no longer on insulin drip.  DM-2 (A1c 8.2 on 1/11) with uncontrolled hyperglycemia due to steroids: Hypoglycemic episode reoccurred this morning-decrease a.m. Levemir to 14 units, decrease p.m. to Levemir to 7 units-cautiously continue with avoidance of NovoLog with meals and SSI.    Recent Labs    09/04/20 0730 09/04/20 0752 09/04/20 1229  GLUCAP 46* 77 140*    Hypothyroidism: Continue levothyroxine-given suppressed TSH-I have decreased dosage to 50 mcg-repeat TSH in 3 months.  HTN: BP stable-continue manidipine and hydralazine.   HLD: Continue statin.  Obesity: Estimated body mass index is 35.56 kg/m as calculated from the following:   Height as of this encounter: 5\' 2"  (1.575 m).   Weight as of this encounter: 88.2 kg.   RN pressure injury documentation: Pressure Injury 08/20/20 Coccyx Mid Stage 1 -  Intact skin with non-blanchable redness of a localized area usually over a bony prominence. (Active)  08/20/20 0800  Location: Coccyx  Location Orientation: Mid  Staging: Stage 1 -  Intact skin with non-blanchable redness of a localized area usually over a bony prominence.  Wound Description (Comments):   Present on Admission: Yes    GI prophylaxis: PPI  ABG:    Component Value Date/Time   PHART 7.266 (L) 08/19/2020 0808   PCO2ART 32.3 08/19/2020 0808   PO2ART 186 (H) 08/19/2020 0808   HCO3 19.0 (L) 08/20/2020 1101   TCO2 16 (L) 08/19/2020 0808   ACIDBASEDEF 6.4 (H) 08/20/2020 1101   O2SAT 85.1 08/20/2020 1101    Vent Settings: N/A    Condition - Extremely Guarded  Family Communication  :  Daughter Mendel Ryder [phone] updated on 1/26-since  medical issues are stable-we will update on 1/28  Code Status :  Full Code  Diet :  Diet Order            Diet Carb Modified Fluid consistency: Thin; Room service appropriate? Yes  Diet effective now                  Disposition Plan  :   Status is: Inpatient  Remains inpatient appropriate because:Inpatient level of care appropriate due to severity of illness   Dispo:  The patient is from: Home              Anticipated d/c is to: SNF              Anticipated d/c date is: > 3 days              Patient currently is not medically stable to d/c.   Barriers to discharge: Hypoxia requiring O2 supplementation  Antimicorbials  :    Anti-infectives (From admission, onward)   Start     Dose/Rate Route Frequency Ordered Stop   08/20/20 1445  cefTRIAXone (ROCEPHIN) 1 g in sodium chloride 0.9 % 100 mL IVPB        1 g 200 mL/hr over 30 Minutes Intravenous Every 24 hours 08/20/20 1348 08/26/20 2147   08/20/20 1000  remdesivir 100 mg in sodium chloride 0.9 % 100 mL IVPB       "Followed by" Linked Group Details   100 mg 200 mL/hr over 30 Minutes Intravenous Daily 08/19/20 0021 08/23/20 1113   08/19/20 0130  remdesivir 200 mg in sodium chloride 0.9% 250 mL IVPB       "Followed by" Linked Group Details   200 mg 580 mL/hr over 30 Minutes Intravenous Once 08/19/20 0021 08/19/20 0314      Inpatient Medications  Scheduled Meds: . amLODipine  10 mg Oral Daily  . vitamin C  500 mg Oral Daily  . aspirin EC  81 mg Oral Daily  . atorvastatin  40 mg Oral Daily  . baricitinib  1 mg Oral Daily  . enoxaparin (LOVENOX) injection  30 mg Subcutaneous Q24H  . feeding supplement (NEPRO CARB STEADY)  237 mL Oral BID BM  . fluticasone  1 spray Each Nare Daily  . hydrALAZINE  25 mg Oral Q8H  . insulin aspart  0-15 Units Subcutaneous TID WC  . insulin aspart  4 Units Subcutaneous TID WC  . [START ON 09/05/2020] insulin detemir  14 Units Subcutaneous q morning - 10a  . insulin detemir  7 Units  Subcutaneous QHS  . levothyroxine  50 mcg Oral Q0600  . mouth rinse  15 mL Mouth Rinse BID  . nitroGLYCERIN  0.5 inch Topical Q6H  . pantoprazole  40 mg Oral BID  . predniSONE  30 mg Oral Q breakfast  . tacrolimus  3 mg Oral QHS  . tacrolimus  4 mg Oral Daily  . zinc sulfate  220 mg Oral Daily   Continuous Infusions: . sodium chloride    . sodium chloride Stopped (08/31/20 1545)   PRN Meds:.sodium chloride, acetaminophen **OR** acetaminophen, guaiFENesin, hydrALAZINE, loperamide, morphine injection, nitroGLYCERIN, ondansetron (ZOFRAN) IV, polyethylene glycol, sodium chloride   Time Spent in minutes  25    See all Orders from today for further details   Oren Binet M.D on 09/04/2020 at 2:27 PM  To page go to [email or web address] - use universal password  [facility] -  Office  [phone]    Objective:   Vitals:   09/04/20 0606 09/04/20 0610 09/04/20 0734 09/04/20 1243  BP:  (!) 124/56 132/61 (!) 141/65  Pulse:   99 89  Resp:   20 18  Temp:   98.7 F (37.1 C) 98.7 F (37.1 C)  TempSrc:   Axillary Axillary  SpO2: 91%  93% 95%  Weight:      Height:        Wt Readings from Last 3 Encounters:  09/04/20 88.2 kg  05/14/20 92.5 kg  05/14/20 92.9 kg  Intake/Output Summary (Last 24 hours) at 09/04/2020 1427 Last data filed at 09/04/2020 0900 Gross per 24 hour  Intake 240 ml  Output --  Net 240 ml     Physical Exam Gen Exam:Alert awake-not in any distress HEENT:atraumatic, normocephalic Chest: B/L clear to auscultation anteriorly CVS:S1S2 regular Abdomen:soft non tender, non distended-has some superficial ecchymosis at the Lovenox injection sites. Extremities:no edema Neurology: Non focal Skin: no rash   Data Review:    CBC Recent Labs  Lab 08/29/20 0128 08/30/20 0502 08/31/20 0135 09/01/20 0022 09/02/20 0202 09/03/20 0059 09/04/20 0131  WBC 16.0*   < > 17.3* 23.0* 19.3* 14.0* 11.8*  HGB 10.9*   < > 11.2* 10.9* 9.8* 7.9* 7.6*  HCT 31.7*    < > 35.0* 32.6* 29.7* 24.6* 23.1*  PLT 146*   < > 128* 107* 115* 103* 101*  MCV 74.1*   < > 76.6* 76.7* 78.6* 78.3* 77.5*  MCH 25.5*   < > 24.5* 25.6* 25.9* 25.2* 25.5*  MCHC 34.4   < > 32.0 33.4 33.0 32.1 32.9  RDW 14.6   < > 15.5 16.2* 16.5* 16.3* 16.4*  LYMPHSABS 0.4*  --   --   --   --   --   --   MONOABS 0.8  --   --   --   --   --   --   EOSABS 0.0  --   --   --   --   --   --   BASOSABS 0.1  --   --   --   --   --   --    < > = values in this interval not displayed.    Chemistries  Recent Labs  Lab 08/31/20 0135 09/01/20 0022 09/02/20 0202 09/03/20 0059 09/04/20 0131  NA 140 140 140 141 140  K 4.5 4.8 5.2* 4.4 4.0  CL 105 108 107 106 105  CO2 25 22 23 25 26   GLUCOSE 162* 222* 185* 143* 66*  BUN 66* 59* 63* 61* 53*  CREATININE 1.87* 1.96* 1.96* 2.04* 2.11*  CALCIUM 9.8 9.7 9.7 9.7 9.6  AST 47* 40 97* 111* 56*  ALT 118* 102* 249* 285* 220*  ALKPHOS 92 74 102 117 111  BILITOT 0.9 0.5 0.6 0.7 1.0   ------------------------------------------------------------------------------------------------------------------ No results for input(s): CHOL, HDL, LDLCALC, TRIG, CHOLHDL, LDLDIRECT in the last 72 hours.  Lab Results  Component Value Date   HGBA1C 8.2 (H) 08/19/2020   ------------------------------------------------------------------------------------------------------------------ Recent Labs    09/02/20 0203  TSH 0.304*   ------------------------------------------------------------------------------------------------------------------ No results for input(s): VITAMINB12, FOLATE, FERRITIN, TIBC, IRON, RETICCTPCT in the last 72 hours.  Coagulation profile No results for input(s): INR, PROTIME in the last 168 hours.  Recent Labs    09/03/20 0059 09/04/20 0131  DDIMER 0.35 <0.27    Cardiac Enzymes No results for input(s): CKMB, TROPONINI, MYOGLOBIN in the last 168 hours.  Invalid input(s):  CK ------------------------------------------------------------------------------------------------------------------    Component Value Date/Time   BNP 235.3 (H) 08/31/2020 2212    Micro Results No results found for this or any previous visit (from the past 240 hour(s)).  Radiology Reports CT CHEST WO CONTRAST  Result Date: 08/24/2020 CLINICAL DATA:  Severe hypoxia and respiratory failure with COVID-19 infection. EXAM: CT CHEST WITHOUT CONTRAST TECHNIQUE: Multidetector CT imaging of the chest was performed following the standard protocol without IV contrast. COMPARISON:  Chest x-ray 08/18/2020 FINDINGS: Cardiovascular: Mild cardiomegaly. Mild calcification over the mitral valve annulus. Thoracic aorta is  normal caliber. Stent over the right subclavian/axillary region. Remaining vascular structures are unremarkable. Mediastinum/Nodes: No evidence of mediastinal or hilar adenopathy. Remaining mediastinal structures are unremarkable. Lungs/Pleura: Lungs are adequately inflated demonstrate moderate bilateral hazy airspace process likely multifocal viral pneumonia in this COVID-19 positive patient. No evidence of effusion. Airways are otherwise unremarkable. Upper Abdomen: No acute findings. Musculoskeletal: Degenerative change of the spine. Mild anterior wedging of a midthoracic vertebral body likely chronic. IMPRESSION: 1. Moderate bilateral hazy airspace process likely multifocal viral pneumonia in this COVID-19 positive patient. 2. Mild cardiomegaly. 3. Mild anterior wedging of a midthoracic vertebral body likely chronic. Electronically Signed   By: Marin Olp M.D.   On: 08/24/2020 12:37   US Renal Transplant w/Doppler  Result Date: 08/19/2020 CLINICAL DATA:  Acute kidney injury EXAM: ULTRASOUND OF RENAL TRANSPLANT WITH RENAL DOPPLER ULTRASOUND TECHNIQUE: Ultrasound examination of the renal transplant was performed with gray-scale, color and duplex doppler evaluation. COMPARISON:  None. FINDINGS:  Transplant kidney location: Left lower quadrant Transplant Kidney: Renal measurements: 10.8 x 6.2 x 6.5 cm = volume: 214mL. Normal in size and parenchymal echogenicity. No evidence of mass or hydronephrosis. No peri-transplant fluid collection seen. Color flow in the main renal artery:  Yes Color flow in the main renal vein:  Yes Duplex Doppler Evaluation: Main Renal Artery Velocity:  cm/sec Main Renal Artery Resistive Index: 0.99 Venous waveform in main renal vein:  Present Intrarenal resistive index in upper pole:  0.98 (normal 0.6-0.8; equivocal 0.8-0.9; abnormal >= 0.9) Intrarenal resistive index in lower pole: 0.98 (normal 0.6-0.8; equivocal 0.8-0.9; abnormal >= 0.9) Bladder: Normal for degree of bladder distention. Other findings:  None. IMPRESSION: Elevated resistive indices within the left lower quadrant transplant kidney. No hydronephrosis. Electronically Signed   By: Rolm Baptise M.D.   On: 08/19/2020 19:15   DG CHEST PORT 1 VIEW  Result Date: 08/31/2020 CLINICAL DATA:  65 year old female with shortness of breath. EXAM: PORTABLE CHEST 1 VIEW COMPARISON:  Chest radiograph dated 08/18/2020. FINDINGS: Bilateral confluent and reticular densities, progressed since the prior radiograph and most consistent with multifocal pneumonia. No pleural effusion pneumothorax. Borderline cardiomegaly. No acute osseous pathology. IMPRESSION: Interval progression of bilateral pulmonary opacities compared to the radiograph of 08/18/2020. Continued follow-up recommended. Electronically Signed   By: Anner Crete M.D.   On: 08/31/2020 21:17   DG Chest Portable 1 View  Result Date: 08/18/2020 CLINICAL DATA:  65 year old female with history of COVID infection. Chest pain. Shortness of breath. EXAM: PORTABLE CHEST 1 VIEW COMPARISON:  Chest x-ray 10/20/2014. FINDINGS: Patchy multifocal airspace consolidation and areas of interstitial prominence are noted throughout the lungs bilaterally, most severe throughout the  periphery of the mid to lower left lung, indicative of multilobar bilateral pneumonia. No definite pleural effusions. No evidence of pulmonary edema. No pneumothorax. Heart size is borderline enlarged. Upper mediastinal contours are within normal limits. Atherosclerotic calcifications in the thoracic aorta. Vascular stent projecting over the right subclavian region. IMPRESSION: 1. Severe multilobar bilateral (left greater than right) pneumonia compatible with reported COVID infection. 2. Aortic atherosclerosis. Electronically Signed   By: Vinnie Langton M.D.   On: 08/18/2020 18:02   ECHOCARDIOGRAM LIMITED  Result Date: 09/02/2020    ECHOCARDIOGRAM LIMITED REPORT   Patient Name:   [facility] Date of Exam: 09/02/2020 Medical Rec #:  [id number]               Height:       62.0 in Accession #:    [id number]  Weight:       194.4 lb Date of Birth:  March 05, 1956              BSA:          1.889 m Patient Age:    59 years                BP:           135/66 mmHg Patient Gender: F                       HR:           100 bpm. Exam Location:  Inpatient Procedure: Limited Echo, Limited Color Doppler and Cardiac Doppler Indications:    chest pain  History:        Patient has prior history of Echocardiogram examinations, most                 recent 09/30/2006. Covid; Risk Factors:Diabetes and Hypertension.  Sonographer:    Johny Chess Referring Phys: [place]  1. Left ventricular ejection fraction, by estimation, is >75%. The left ventricle has hyperdynamic function. The left ventricle has no regional wall motion abnormalities. There is moderate left ventricular hypertrophy. Left ventricular diastolic parameters are consistent with Grade I diastolic dysfunction (impaired relaxation).  2. Right ventricular systolic function is normal. The right ventricular size is normal.  3. The mitral valve is normal in structure. No evidence of mitral valve regurgitation. No evidence of  mitral stenosis. Moderate mitral annular calcification.  4. The aortic valve is tricuspid. Aortic valve regurgitation is not visualized. Mild aortic valve sclerosis is present, with no evidence of aortic valve stenosis.  5. The inferior vena cava is normal in size with greater than 50% respiratory variability, suggesting right atrial pressure of 3 mmHg. FINDINGS  Left Ventricle: Left ventricular ejection fraction, by estimation, is >75%. The left ventricle has hyperdynamic function. The left ventricle has no regional wall motion abnormalities. The left ventricular internal cavity size was normal in size. There is moderate left ventricular hypertrophy. Left ventricular diastolic parameters are consistent with Grade I diastolic dysfunction (impaired relaxation). Right Ventricle: The right ventricular size is normal. Right ventricular systolic function is normal. Left Atrium: Left atrial size was normal in size. Right Atrium: Right atrial size was normal in size. Pericardium: There is no evidence of pericardial effusion. Mitral Valve: The mitral valve is normal in structure. Moderate mitral annular calcification. No evidence of mitral valve stenosis. Tricuspid Valve: The tricuspid valve is normal in structure. Tricuspid valve regurgitation is trivial. No evidence of tricuspid stenosis. Aortic Valve: The aortic valve is tricuspid. Aortic valve regurgitation is not visualized. Mild aortic valve sclerosis is present, with no evidence of aortic valve stenosis. Pulmonic Valve: The pulmonic valve was normal in structure. Pulmonic valve regurgitation is not visualized. No evidence of pulmonic stenosis. Aorta: The aortic root is normal in size and structure. Venous: The inferior vena cava is normal in size with greater than 50% respiratory variability, suggesting right atrial pressure of 3 mmHg. IAS/Shunts: No atrial level shunt detected by color flow Doppler. LEFT VENTRICLE PLAX 2D LVIDd:         3.70 cm  Diastology LVIDs:          2.50 cm  LV e' medial:    5.33 cm/s LV PW:         1.00 cm  LV E/e' medial:  13.8 LV IVS:  0.90 cm  LV e' lateral:   5.66 cm/s LVOT diam:     1.70 cm  LV E/e' lateral: 13.0 LV SV:         59 LV SV Index:   31 LVOT Area:     2.27 cm  IVC IVC diam: 1.30 cm LEFT ATRIUM         Index LA diam:    3.90 cm 2.06 cm/m  AORTIC VALVE LVOT Vmax:   170.00 cm/s LVOT Vmean:  114.000 cm/s LVOT VTI:    0.258 m  AORTA Ao Asc diam: 2.60 cm MV E velocity: 73.50 cm/s MV A velocity: 121.00 cm/s  SHUNTS MV E/A ratio:  0.61         Systemic VTI:  0.26 m                             Systemic Diam: 1.70 cm Kirk Ruths MD Electronically signed by Kirk Ruths MD Signature Date/Time: 09/02/2020/12:26:37 PM    Final

## 2020-09-04 NOTE — Progress Notes (Signed)
[facility] Progress Note   65 yo F with hx of DDRT x2 (latest 2016) on prograf, myfortic, and low-dose prednisone, T2DM, hypothyroidism, and recent diagnosis of COVID-19 presenting with acute hypoxic respiratory failure 2/2 COVID PNA. Also found to be in DKA with worsening AoCKD and hyperkalemia on labs.  Electrolyte disturbances improved.  Patient is slowly improving but creatinine remains consistently between 1.9 and 2.1.  Assessment:    1. AKI on CKD; P creatinine for overall improved 2. H/O DDKT x 2 (latest 2016). Baseline Cr ?1.7 but probably around 1.5-2.Follows w/ Dr. Marval Regal ([facility]) & [facility] 3. Chronic immunosuppressionTac/MMF/Pred 4. AHRF secondary COVID-19 PNA, improving, prednisone 5. DKA, h/o uncontrolled DM2w/ hyperglycemia, resolved 6. Hyperkalemia, resolved.  7. Anion Gap Metabolic acidosis, resolved/stable on NaHCO3 8. HTN  Plan: -Creatinine rising for several days but overall improved this admission.  Creatinine may remain around 2 for several weeks -We will check tacrolimus trough to ensure it is not elevated -Continue monitor creatinine daily -New holding MMF and restart once patient has improved further -Stop sodium bicarb given improvement in serum bicarbonate levels -No need for hydration from my standpoint at this time.  Patient appears euvolemic  Subjective:     Asked to evaluate again due to persistently elevated creatinine/slightly elevated creatinine over the past few days.  Patient has persistent hypoxia that is overall improved.  She feels that her symptoms of shortness of breath and cough are improving but do persist.  She admits to having some ongoing difficulties breathing today.  Says that her symptoms are largely intermittent.  No lower extremity edema at this time.  Feels that her urine output is adequate.  No pain over her graft at this time.  Patient states she has diarrhea sometimes.  Only 2 bowel movements a  day.  Urine output not well documented, remains tachycardic, blood pressure overall normal, O2 flow rate today 7 L/min  Creatinine decreased down to 1.87 on 08/31/2020.  Has steadily risen for several days now 2.1.  Patient says baseline is anywhere between 1.3 and 2.    Electrolytes acceptable at this time.  On tac/Pred, MMF held    Objective:   BP (!) 141/65 (BP Location: Left Arm)   Pulse 89   Temp 98.7 F (37.1 C) (Axillary)   Resp 18   Ht 5\' 2"  (1.575 m)   Wt 88.2 kg   SpO2 95%   BMI 35.56 kg/m   Intake/Output Summary (Last 24 hours) at 09/04/2020 1523 Last data filed at 09/04/2020 0900 Gross per 24 hour  Intake 240 ml  Output --  Net 240 ml   Weight change:   Physical Exam: GEN: NAD, NCAT HEENT: No conjunctival pallor, EOMI NECK: Supple, no thyromegaly LUNGS: bilateral chest rise, iwob present CV: normal rate, no audible murmur ABD: SNDNT +BS  EXT: No lower extremity edema, warm and well perfused ACCESS: rt BCF aneurysmal pulsatile   Imaging: No results found.  Labs: BMET Recent Labs  Lab 08/29/20 0128 08/30/20 0502 08/31/20 0135 09/01/20 0022 09/02/20 0202 09/03/20 0059 09/04/20 0131  NA 135 141 140 140 140 141 140  K 4.0 4.7 4.5 4.8 5.2* 4.4 4.0  CL 101 104 105 108 107 106 105  CO2 24 25 25 22 23 25 26   GLUCOSE 177* 144* 162* 222* 185* 143* 66*  BUN 75* 71* 66* 59* 63* 61* 53*  CREATININE 1.94* 1.96* 1.87* 1.96* 1.96* 2.04* 2.11*  CALCIUM 9.8 9.5 9.8 9.7 9.7 9.7 9.6  CBC Recent Labs  Lab 08/29/20 0128 08/30/20 0502 09/01/20 0022 09/02/20 0202 09/03/20 0059 09/04/20 0131  WBC 16.0*   < > 23.0* 19.3* 14.0* 11.8*  NEUTROABS 13.8*  --   --   --   --   --   HGB 10.9*   < > 10.9* 9.8* 7.9* 7.6*  HCT 31.7*   < > 32.6* 29.7* 24.6* 23.1*  MCV 74.1*   < > 76.7* 78.6* 78.3* 77.5*  PLT 146*   < > 107* 115* 103* 101*   < > = values in this interval not displayed.    Medications:    . amLODipine  10 mg Oral Daily  . vitamin C  500 mg Oral  Daily  . aspirin EC  81 mg Oral Daily  . atorvastatin  40 mg Oral Daily  . baricitinib  1 mg Oral Daily  . enoxaparin (LOVENOX) injection  30 mg Subcutaneous Q24H  . feeding supplement (NEPRO CARB STEADY)  237 mL Oral BID BM  . fluticasone  1 spray Each Nare Daily  . hydrALAZINE  25 mg Oral Q8H  . insulin aspart  0-15 Units Subcutaneous TID WC  . insulin aspart  4 Units Subcutaneous TID WC  . [START ON 09/05/2020] insulin detemir  14 Units Subcutaneous q morning - 10a  . insulin detemir  7 Units Subcutaneous QHS  . levothyroxine  50 mcg Oral Q0600  . mouth rinse  15 mL Mouth Rinse BID  . nitroGLYCERIN  0.5 inch Topical Q6H  . pantoprazole  40 mg Oral BID  . predniSONE  30 mg Oral Q breakfast  . tacrolimus  3 mg Oral QHS  . tacrolimus  4 mg Oral Daily  . zinc sulfate  220 mg Oral Daily    Reesa Chew, MD  09/04/2020, 3:23 PM

## 2020-09-05 ENCOUNTER — Inpatient Hospital Stay (HOSPITAL_COMMUNITY): Payer: HMO

## 2020-09-05 DIAGNOSIS — Z940 Kidney transplant status: Secondary | ICD-10-CM

## 2020-09-05 DIAGNOSIS — J9601 Acute respiratory failure with hypoxia: Secondary | ICD-10-CM | POA: Diagnosis not present

## 2020-09-05 DIAGNOSIS — E1122 Type 2 diabetes mellitus with diabetic chronic kidney disease: Secondary | ICD-10-CM | POA: Diagnosis not present

## 2020-09-05 DIAGNOSIS — N179 Acute kidney failure, unspecified: Secondary | ICD-10-CM | POA: Diagnosis not present

## 2020-09-05 DIAGNOSIS — E875 Hyperkalemia: Secondary | ICD-10-CM | POA: Diagnosis not present

## 2020-09-05 DIAGNOSIS — U071 COVID-19: Secondary | ICD-10-CM | POA: Diagnosis not present

## 2020-09-05 DIAGNOSIS — D849 Immunodeficiency, unspecified: Secondary | ICD-10-CM | POA: Diagnosis not present

## 2020-09-05 LAB — URINALYSIS, ROUTINE W REFLEX MICROSCOPIC
Bilirubin Urine: NEGATIVE
Glucose, UA: NEGATIVE mg/dL
Hgb urine dipstick: NEGATIVE
Ketones, ur: NEGATIVE mg/dL
Nitrite: NEGATIVE
Protein, ur: NEGATIVE mg/dL
Specific Gravity, Urine: 1.012 (ref 1.005–1.030)
pH: 7 (ref 5.0–8.0)

## 2020-09-05 LAB — RENAL FUNCTION PANEL
Albumin: 3 g/dL — ABNORMAL LOW (ref 3.5–5.0)
Anion gap: 11 (ref 5–15)
BUN: 48 mg/dL — ABNORMAL HIGH (ref 8–23)
CO2: 22 mmol/L (ref 22–32)
Calcium: 9.6 mg/dL (ref 8.9–10.3)
Chloride: 107 mmol/L (ref 98–111)
Creatinine, Ser: 2.12 mg/dL — ABNORMAL HIGH (ref 0.44–1.00)
GFR, Estimated: 25 mL/min — ABNORMAL LOW (ref >60)
Glucose, Bld: 98 mg/dL (ref 70–99)
Phosphorus: 3.4 mg/dL (ref 2.5–4.6)
Potassium: 4.4 mmol/L (ref 3.5–5.1)
Sodium: 140 mmol/L (ref 135–145)

## 2020-09-05 LAB — GLUCOSE, CAPILLARY
Glucose-Capillary: 89 mg/dL (ref 70–99)
Glucose-Capillary: 66 mg/dL — ABNORMAL LOW (ref 70–99)
Glucose-Capillary: 170 mg/dL — ABNORMAL HIGH (ref 70–99)
Glucose-Capillary: 217 mg/dL — ABNORMAL HIGH (ref 70–99)
Glucose-Capillary: 182 mg/dL — ABNORMAL HIGH (ref 70–99)

## 2020-09-05 LAB — CBC
HCT: 27 % — ABNORMAL LOW (ref 36.0–46.0)
Hemoglobin: 8.6 g/dL — ABNORMAL LOW (ref 12.0–15.0)
MCH: 25.3 pg — ABNORMAL LOW (ref 26.0–34.0)
MCHC: 31.9 g/dL (ref 30.0–36.0)
MCV: 79.4 fL — ABNORMAL LOW (ref 80.0–100.0)
Platelets: 105 10*3/uL — ABNORMAL LOW (ref 150–400)
RBC: 3.4 MIL/uL — ABNORMAL LOW (ref 3.87–5.11)
RDW: 17.4 % — ABNORMAL HIGH (ref 11.5–15.5)
WBC: 12 10*3/uL — ABNORMAL HIGH (ref 4.0–10.5)
nRBC: 0.2 % (ref 0.0–0.2)

## 2020-09-05 LAB — PROCALCITONIN: Procalcitonin: <0.1 ng/mL

## 2020-09-05 LAB — C-REACTIVE PROTEIN: CRP: 0.6 mg/dL (ref <1.0)

## 2020-09-05 LAB — BRAIN NATRIURETIC PEPTIDE: B Natriuretic Peptide: 107 pg/mL — ABNORMAL HIGH (ref 0.0–100.0)

## 2020-09-05 LAB — D-DIMER, QUANTITATIVE: D-Dimer, Quant: 0.4 ug/mL-FEU (ref 0.00–0.50)

## 2020-09-05 MED ORDER — VANCOMYCIN HCL IN DEXTROSE 1-5 GM/200ML-% IV SOLN
1000.0000 mg | INTRAVENOUS | Status: DC
  Filled 2020-09-05: qty 200

## 2020-09-05 MED ORDER — SODIUM CHLORIDE 0.9 % IV SOLN
2.0000 g | INTRAVENOUS | Status: DC
  Administered 2020-09-05 – 2020-09-06 (×2): 2 g via INTRAVENOUS
  Filled 2020-09-05 (×3): qty 2

## 2020-09-05 MED ORDER — VANCOMYCIN HCL 2000 MG/400ML IV SOLN
2000.0000 mg | Freq: Once | INTRAVENOUS | Status: AC
  Administered 2020-09-05: 2000 mg via INTRAVENOUS
  Filled 2020-09-05: qty 400

## 2020-09-05 MED ORDER — FUROSEMIDE 10 MG/ML IJ SOLN
40.0000 mg | Freq: Once | INTRAMUSCULAR | Status: AC
  Administered 2020-09-05: 40 mg via INTRAVENOUS
  Filled 2020-09-05: qty 4

## 2020-09-05 MED ORDER — ALBUTEROL SULFATE HFA 108 (90 BASE) MCG/ACT IN AERS
2.0000 | INHALATION_SPRAY | RESPIRATORY_TRACT | Status: DC | PRN
  Administered 2020-09-05 – 2020-09-07 (×3): 2 via RESPIRATORY_TRACT
  Filled 2020-09-05 (×2): qty 6.7

## 2020-09-05 MED ORDER — FUROSEMIDE 10 MG/ML IJ SOLN
60.0000 mg | Freq: Once | INTRAMUSCULAR | Status: AC
  Administered 2020-09-05: 60 mg via INTRAVENOUS
  Filled 2020-09-05: qty 6

## 2020-09-05 MED ORDER — METHYLPREDNISOLONE SODIUM SUCC 40 MG IJ SOLR
40.0000 mg | Freq: Two times a day (BID) | INTRAMUSCULAR | Status: DC
  Administered 2020-09-05 – 2020-09-08 (×7): 40 mg via INTRAVENOUS
  Filled 2020-09-05 (×7): qty 1

## 2020-09-05 NOTE — Progress Notes (Signed)
NAME:  [name] [name], MRN:  [id number], DOB:  [birth year], LOS: 79 ADMISSION DATE:  08/18/2020, CONSULTATION DATE: Initial consult 08/18/2020, re-consult 09/05/2020 REFERRING MD:  Sloan Leiter - [facility], CHIEF COMPLAINT: Worsening Hypoxic respiratory failure  HPI/course in hospital    Per the chart 65 year old female former smoker ( Quit 1990 with a 5 pack year smoking history) who developed symptoms of chills cough fatigue on 12/29 and tested positive for Covid 1/5.  She is fully vaccinated with her booster in November.  Positive family contacts.  Initially had nausea and diarrhea which have both subsided.  Was referred for monoclonal antibody but was deemed too sick for outpatient treatment and emergency department referral was recommended at that time.  Brought in 08/18/2020 to emergency department via EMS for worsening chest pain shortness of breath and generalized weakness.  Initially oxygen saturations 96% on 2 L nasal cannula.  Admitted to hospitalist service.  Found to have multifocal pneumonia on chest x-ray compatible with COVID-19 pneumonia but also worsening renal function with significant metabolic acidosis.  Nephrology consulted.   Worsening tachypnea 1/11 with escalation in oxygen requirements.  Started on steroids and remdesivir; tx to ICU.    While in the ICU she was treated with HHF, continued tx with decadron x 10 days total and remdesivir x 5 days. Both respiratory function and renal function improved and she was transferred back to the hospitalist  Service on 1/14.  PCCM was re consulted by Dr. Sloan Leiter 09/05/2020 for increasing WOB, increased oxygen requirements and worsening renal function.   She is very immunocompromised. She was given Lasix 60 mg ( 5L +, but  Weight is stable), empirically start  on vancomycin/cefepime (check MRSA PCR) and change from prednisone-back to IV Solu-Medrol. [facility] had a long talk with patient at bedside-and then with daughter over the phone-both  understand the tenuous nature of the situation-explained that if her respiratory failure progresses-recommendations are that she not be placed on a ventilator-as she probably has a significantly high mortality given how late into her illness she is. Recommended that we continue with IV antibiotics, IV steroids and as needed diuretics-follow clinical course-and if she deteriorates-consider hospice care.Upon arrival to the hospital the daughter agreed with this and patient has been made a DNR.       Past Medical History   Past Medical History:  Diagnosis Date  . Blood transfusion   . Chronic kidney disease    s/p transplant, kidney wasn't removed  . Gout due to renal impairment 2010   "before finding out I had kidney failure"  . History of renal transplant   . Hyperlipidemia   . Hypertension   . Sickle cell trait ([place])   . Thyroid disease   . Type II diabetes mellitus ([place])   . UTI (lower urinary tract infection) 12/08/11   "first one ever"      Significant Hospital Events:  1/10 admitted [facility] 1/11 ICU and CCM 1/14 To [facility]>> 5W   Consults:  Nephrology  PCCM  Procedures:   Significant Diagnostic Tests:  1/11 renal US >> Elevated resistive indices within the left lower quadrant transplant kidney.   No hydronephrosis. 09/05/2020 CXR Progression of diffuse bilateral pulmonary opacities  Micro Data:  1/12 MRSA >> neg 1/10 BCx2 >> 1/11 UC >> klebsiella pna  Antimicrobials:  1/12 ceftriaxone >>  1/10 remdesivir; 1/12;  1/11 dexamethasone >>  Interim history/subjective:  Awake and alert on HHF and NRB  States breathing is a little worse Sats  98% RR 33 with mild accessory muscle use noted  Creatinine 2.12 + 5.2 L CXR 1/28 personally reviewed   Progression of diffuse bilateral pulmonary opacities Already received 60 mg Lasix per [facility] Additional 40 mg ordered per CCM Dr. Tacy Learn and Ghimire spoke with daughter regarding patient   Objective   Blood pressure  114/60, pulse (!) 107, temperature 97.8 F (36.6 C), temperature source Oral, resp. rate 20, height 5\' 2"  (1.575 m), weight 87.9 kg, SpO2 93 %.    FiO2 (%):  [100 %] 100 %   Intake/Output Summary (Last 24 hours) at 09/05/2020 1029 Last data filed at 09/04/2020 2200 Gross per 24 hour  Intake 240 ml  Output --  Net 240 ml   Filed Weights   09/03/20 2035 09/04/20 0236 09/05/20 0659  Weight: 88.2 kg 88.2 kg 87.9 kg    Examination: General: Adult female, in mild distress on HHF and NRB Neuro: A&O x 3,MAE x 4,  no deficits. HEENT: [place]/AT. Sclerae anicteric. EOMI. Cardiovascular: RRR, no M/R/G. ST per tele Lungs: Bilateral chest excursion, Respirations even and mildly labored with mild accessory muscle use. Few crackles noted per bases. Abdomen: BS x 4, soft, NT/ND.  Musculoskeletal: No gross deformities, trace edema.  Skin: Intact, warm, no rashes.     Assessment & Plan:   Critically ill due to acute hypoxic respiratory failure secondary to COVID-19 pneumonia requiring heated high flow nasal cannula. - Currently in mild  distress and happy hypoxic with acceptable saturations on HHFNC   - Maintain O2 saturations for goal SpO2 > 88% - Give an additional 40 Lasix - Empirically start her on vancomycin/cefepime per primary team - check MRSA PCR - change from prednisone-back to IV Solu-Medrol - Self proning as tolerated - Trend inflammatory markers - Ongoing aggressive pulmonary hygiene, progress activity as tolerated, add PT    today, encourage IS  -  Made DNR by family , continue current care but  no intubation or escalation of care    Hx renal transplant x 2 (last 2016) - on mycophenolate, prograf, steroids. Worsening renal function 1/28 + 5L - Trend BMET - Additoinal Lasix 40 mg now - Strict I&O - Rest per [facility] and nephrology  DKA, possible - resolved Hx type 2 diabetes mellitus on insulin, exacerbated by steroids - resolved; off insulin gtt 1/12 - continue SSI   Moderate and insulin per primary    Daily Goals Checklist  Pain/Anxiety/Delirium protocol (if indicated): None required VAP protocol (if indicated): Not intubated Respiratory support goals: Heated high flow and NRB  to keep saturations greater than 88%,BiPAP as needed DVT prophylaxis: Heparin 3 times daily Nutritional status and feeding goals: Moderate nutritional risk.  Encourage oral intake GI prophylaxis: not indicated.  Fluid status goals: Euvolemia Urinary catheter:NA Central lines:  PIV only at this time. Glucose control: CBG and SSI Mobility/therapy needs: PT Antibiotic Empirically start her on vancomycin/cefepime per primary team Home medication reconciliation: anti-rejection medications ordered Code Status: Full code  Family Communication: Goals of care discussion 1/28 with daughter , Dr. Sloan Leiter, Dr. Tacy Learn, Made DNR Disposition: 5W  Critical care time: 60 minutes .    Magdalen Spatz, MSN, AGACNP-BC [place] for personal pager PCCM on call pager [phone] 09/05/2020, 10:29 AM

## 2020-09-05 NOTE — Progress Notes (Signed)
PT Cancellation Note  Patient Details Name: [name] [name] MRN: [id number] DOB: July 19, 1956   Cancelled Treatment:    Reason Eval/Treat Not Completed: (P) Medical issues which prohibited therapy Pt with increased O2 demand now on 15L HFNC with 15L 100%O2 NRB. PT will follow back next week for treatment.    [place] 09/05/2020, 10:20 AM

## 2020-09-05 NOTE — Progress Notes (Signed)
HOSPITAL MEDICINE OVERNIGHT EVENT NOTE    Notified by nursing that patient is now exhibiting a red MEWS score.  Patient is still exhibiting significant hypoxia, currently receiving 15 L via high flow nasal cannula combined with nonrebreather mask.  Patient is currently resting however without complaints.  Patient is currently saturating consistently above 90%.  Patient is hemodynamically stable.  Patient is afebrile.  Patient is currently receiving broad-spectrum intravenous antibacterial therapy with cefepime and vancomycin.  Patient is additionally receiving a course of Barcitinib and has completed their course of remdesivir.  Continue current plan of care.  Monitor closely.  [name] Emerald  MD [facility]

## 2020-09-05 NOTE — Telephone Encounter (Signed)
The patient is still currently admitted.   

## 2020-09-05 NOTE — Progress Notes (Signed)
2 IV RN's assessed   L arm with [place], no suitable veins noted.R arm restricted RN aware and recommend if IV access needed to placed central line.

## 2020-09-05 NOTE — Progress Notes (Signed)
[name] Progress Note   65 yo F with hx of DDRT x2 (latest 2016) on prograf, myfortic, and low-dose prednisone, T2DM, hypothyroidism, and recent diagnosis of COVID-19 presenting with acute hypoxic respiratory failure 2/2 COVID PNA. Also found to be in DKA with worsening AoCKD and hyperkalemia on labs.  Electrolyte disturbances improved.  Patient is slowly improving but creatinine remains consistently between 1.9 and 2.1.  Assessment:    1. AKI on CKD;  creatinine for overall improved 2. H/O DDKT x 2 (latest 2016). Baseline Cr ?1.7 but probably around 1.5-2.Follows w/ Dr. Marval Regal ([facility]) & [facility] 3. Chronic immunosuppressionTac/MMF/Pred 4. AHRF secondary COVID-19 PNA, improving but today worse with progression of infiltrates 5. DKA, h/o uncontrolled DM2w/ hyperglycemia, resolved 6. Hyperkalemia, resolved.  7. Anion Gap Metabolic acidosis, resolved/stable on NaHCO3 8. HTN  Plan: -Creatinine rising for several days but overall improved this admission.  Creatinine may remain around 2 for several weeks - pending tacrolimus trough to ensure it is not elevated -Continue monitor creatinine daily -Now holding MMF and restart once patient has improved further -in light of worsening pulmonary status ok with trial of IV diuretic - she's not grossly volume overloaded and her pulmonary status is currently so tenuous it may help; abx also being added  Subjective:    Worsening pulmonary status this AM  No UOP documented    Objective:   BP 123/65 (BP Location: Right Arm)   Pulse 100   Temp 97.7 F (36.5 C) (Axillary)   Resp 20   Ht 5\' 2"  (1.575 m)   Wt 87.9 kg   SpO2 96%   BMI 35.44 kg/m   Intake/Output Summary (Last 24 hours) at 09/05/2020 1707 Last data filed at 09/05/2020 1142 Gross per 24 hour  Intake 360 ml  Output -  Net 360 ml   Weight change: -0.3 kg  Physical Exam: GEN: NAD, NCAT HEENT: No conjunctival pallor, EOMI NECK: Supple, no  thyromegaly LUNGS: bilateral chest rise, iwob present CV: normal rate, no audible murmur ABD: SNDNT +BS  EXT: No lower extremity edema, warm and well perfused ACCESS: rt BCF aneurysmal pulsatile   Imaging: DG Chest Port 1V same Day  Result Date: 09/05/2020 CLINICAL DATA:  Shortness of breath, COVID positive EXAM: PORTABLE CHEST 1 VIEW COMPARISON:  08/31/2020 FINDINGS: Persistent diffuse bilateral pulmonary opacities. Aeration has decreased since the prior study. No significant pleural effusion. No pneumothorax. Stable cardiomediastinal contours. IMPRESSION: Progression of diffuse bilateral pulmonary opacities. Electronically Signed   By: Macy Mis M.D.   On: 09/05/2020 08:05    Labs: BMET Recent Labs  Lab 08/30/20 0502 08/31/20 0135 09/01/20 0022 09/02/20 0202 09/03/20 0059 09/04/20 0131 09/05/20 0129  NA 141 140 140 140 141 140 140  K 4.7 4.5 4.8 5.2* 4.4 4.0 4.4  CL 104 105 108 107 106 105 107  CO2 25 25 22 23 25 26 22   GLUCOSE 144* 162* 222* 185* 143* 66* 98  BUN 71* 66* 59* 63* 61* 53* 48*  CREATININE 1.96* 1.87* 1.96* 1.96* 2.04* 2.11* 2.12*  CALCIUM 9.5 9.8 9.7 9.7 9.7 9.6 9.6  PHOS  --   --   --   --   --   --  3.4   CBC Recent Labs  Lab 09/02/20 0202 09/03/20 0059 09/04/20 0131 09/05/20 0129  WBC 19.3* 14.0* 11.8* 12.0*  HGB 9.8* 7.9* 7.6* 8.6*  HCT 29.7* 24.6* 23.1* 27.0*  MCV 78.6* 78.3* 77.5* 79.4*  PLT 115* 103* 101* 105*  Medications:    . amLODipine  10 mg Oral Daily  . vitamin C  500 mg Oral Daily  . aspirin EC  81 mg Oral Daily  . atorvastatin  40 mg Oral Daily  . baricitinib  1 mg Oral Daily  . enoxaparin (LOVENOX) injection  30 mg Subcutaneous Q24H  . feeding supplement (NEPRO CARB STEADY)  237 mL Oral BID BM  . fluticasone  1 spray Each Nare Daily  . hydrALAZINE  25 mg Oral Q8H  . insulin aspart  0-15 Units Subcutaneous TID WC  . insulin aspart  4 Units Subcutaneous TID WC  . insulin detemir  14 Units Subcutaneous q morning - 10a   . insulin detemir  7 Units Subcutaneous QHS  . levothyroxine  50 mcg Oral Q0600  . mouth rinse  15 mL Mouth Rinse BID  . methylPREDNISolone (SOLU-MEDROL) injection  40 mg Intravenous Q12H  . nitroGLYCERIN  0.5 inch Topical Q6H  . pantoprazole  40 mg Oral BID  . tacrolimus  3 mg Oral QHS  . tacrolimus  4 mg Oral Daily  . zinc sulfate  220 mg Oral Daily    Justin Mend, MD  09/05/2020, 5:07 PM

## 2020-09-05 NOTE — Progress Notes (Signed)
Patient daughter Maud Deed) approved to visit for a few hours. Charge nurse aware.

## 2020-09-05 NOTE — Progress Notes (Signed)
Pharmacy Antibiotic Note  [name] [name] is a 65 y.o. female admitted on 08/18/2020 with pneumonia.  Pharmacy has been consulted for vancomycin and cefepime dosing. Patient with h/o renal transplant  Plan: Cefepime 2gm IV q24 Vancomycin 2000mg  IV x 1 then 1000mg  q48h for projected AUC of 520, Scr 2.12 Monitor clinical course for deescalation  Height: 5\' 2"  (157.5 cm) Weight: 87.9 kg (193 lb 12.6 oz) IBW/kg (Calculated) : 50.1  Temp (24hrs), Avg:98.1 F (36.7 C), Min:97.8 F (36.6 C), Max:98.7 F (37.1 C)  Recent Labs  Lab 09/01/20 0022 09/02/20 0202 09/03/20 0059 09/04/20 0131 09/05/20 0129  WBC 23.0* 19.3* 14.0* 11.8* 12.0*  CREATININE 1.96* 1.96* 2.04* 2.11* 2.12*    Estimated Creatinine Clearance: 27.2 mL/min (A) (by C-G formula based on SCr of 2.12 mg/dL (H)).    Allergies  Allergen Reactions  . Codeine Hives  . Acetaminophen-Codeine Rash    Antimicrobials this admission:  1/28  Cefepime >>  1/28 Vancomycin  >>   Dose adjustments this admission: n/a  Microbiology results: 1/10 BCx: NG 1/11 UCx: >100K Kleb  1/28  MRSA PCR: ordered  Jhon Mallozzi A. Levada Dy, PharmD, BCPS, FNKF Clinical Pharmacist [place] Please utilize Amion for appropriate phone number to reach the unit pharmacist ([place])   09/05/2020 9:27 AM

## 2020-09-05 NOTE — Progress Notes (Signed)
PROGRESS NOTE                                                                                                                                                                                                             Patient Demographics:    [name] [name], is a 65 y.o. female, DOB - 06/05/1956, YME:[id number]  Outpatient Primary MD for the patient is Glendale Chard, MD   Admit date - 08/18/2020   LOS - 30  Chief Complaint  Patient presents with  . Covid Positive  . Chest Pain  . Weakness       Brief Narrative: Patient is a 64 y.o. female with PMHx of renal transplant x2 (latest in 2016) on chronic immunosuppressive agents, DM-2, hypothyroidism-who presented with shortness of breath-found to have acute hypoxic respiratory failure due to COVID-19 pneumonia.  Hospital course complicated by slow improvement from severe hypoxemia, AKI and non-STEMI.  COVID-19 vaccinated status: Vaccinated including booster  Significant Events: 1/10>> Admit to [facility] for acute hypoxic respiratory failure due to COVID-19 pneumonia 1/11>> transfer to ICU due to worsening hypoxemia, AKI/metabolic acidosis/hyperkalemia. 1/14>> transferred to [facility] 1/23>> chest pain-non-STEMI-cardiology consult 1/25>> chest pain-resolved by SL nitroglycerin x1 1/28>> tachypneic- worsening hypoxemia-now on 15 L via NRB and 15 L via HFNC  Significant studies: 1/10>> chest x-ray: Severe multilobar bilateral pneumonia. 1/11>> ultrasound renal transplant: No hydronephrosis 1/16>> CT chest: Multifocal viral pneumonia 1/26>>Echo: EF> 70%, LVH, grade 1 diastolic dysfunction 6/80>> chest x-ray: Progression of diffuse bilateral pulmonary opacitie   COVID-19 medications: Steroids: 1/10>> Remdesivir: 1/10>>1/15 Baricitinib: 1/16>>  Antibiotics: Rocephin: 1/12>> 1/18 Cefepime: 1/28>> Vancomycin: 1/28>>  Microbiology data: 1/10 >>blood culture: No  growth 1/11>> urine culture: Klebsiella pneumoniae  Procedures: None  Consults: PCCM, nephrology, cardiology  DVT prophylaxis: enoxaparin (LOVENOX) injection 30 mg Start: 09/03/20 1400 SCDs Start: 08/19/20 0933    Subjective:   Developed worsening shortness of breath earlier this morning-tachypneic-on 15 L via NRB and 15 L via nonrebreather mask.   Assessment  & Plan :   Acute Hypoxic Resp Failure due to Covid 19 Viral pneumonia: Had gradually improved since admission-but now rapid worsening overnight. Although tachypneic-stable and not in acute distress-on 15 L via NRB and 15 L via HFNC. Chest x-ray with significant diffuse bilateral infiltrates-no pneumothorax. Difficult situation-severely immunocompromised-although no signs of volume overload-we will go ahead and give 1  dose of IV Lasix, empirically start her on vancomycin/cefepime (check MRSA PCR) and change from prednisone-back to IV Solu-Medrol. Long talk with patient at bedside-and then with daughter over the phone-both understand the tenuous nature of the situation-explained that if her respiratory failure progresses-recommendations are that she not be placed on a ventilator-as she probably has a significantly high mortality given how late into her illness she is. Recommended that we continue with IV antibiotics, IV steroids and as needed diuretics-follow clinical course-and if she deteriorates-consider hospice care. Family discussion in progress-daughter is on the way to the hospital-I have reached out to my PCCM colleagues as well.  Fever: afebrile O2 requirements:  SpO2: 93 % O2 Flow Rate (L/min): 15 L/min FiO2 (%): 100 %   COVID-19 Labs: Recent Labs    09/03/20 0059 09/04/20 0131 09/05/20 0129  DDIMER 0.35 <0.27 0.40  CRP 0.7 0.6  --        Component Value Date/Time   BNP 107.0 (H) 09/05/2020 0129    No results for input(s): PROCALCITON in the last 168 hours.  Lab Results  Component Value Date   [place]  NEGATIVE 04/27/2020   [place] Not Detected 01/30/2019     Prone/Incentive Spirometry: encouraged  incentive spirometry use 3-4/hour.  Sinus pause: Occurred on 1/20-approximately 4.25 seconds-this occurred while she was sleeping-after discussion with cardiology-beta-blocker discontinued.  Needs outpatient polysomnography.  Non-STEMI: Developed severe chest pain on 1/23 with EKG changes-relieved with nitroglycerin and morphine infusion-chest pain reoccurred on 1/25.  Echo is reassuring with preserved EF.  On aspirin/statin-has completed the 48 hours of IV heparin.  Cardiology has signed off  AKI on CKD stage IV-transplanted kidney (second renal transplant-01/2015): AKI hemodynamically mediated-creatinine has overall improved-and plateaued to around 1.9 range-but lately has started to trend up. Nephrology reconsulted-Myfortic remains on hold-on Prograf and steroids-Prograf levels pending. Discussed earlier this morning with Dr. Dan Humphreys to try IV diuretics given tenuous respiratory status to see if this improves her tachypnea/hypoxia.  Hyperkalemia/anion gap metabolic acidosis: Secondary to AKI-has resolved.  Transaminitis: LFTs gradually getting better-continue to monitor closely-watch closely as on statins.    Anemia: Due to critical illness and CKD-watch closely-no evidence of blood loss-had brown-colored stools the day before per patient. Transfuse if less than 7. Yesterday per patient.    Thrombocytopenia: Mild-stable-gradually improving-watch closely.   Complicated UTI: Completed a course of Rocephin.  Afebrile and nontoxic-appearing.  DKA: Resolved-no longer on insulin drip.  DM-2 (A1c 8.2 on 1/11) with uncontrolled hyperglycemia due to steroids: Hypoglycemic episode reoccurred this morning-Levemir dosage has been adjusted for the past few days-steroid dosage significantly escalated today-suspect at risk for hypoglycemia. Continue Levemir at current doses-watch closely-if  hypoglycemia reoccurs-may need to adjust further.  Recent Labs    09/04/20 2141 09/05/20 0306 09/05/20 0751  GLUCAP 166* 89 66*    Hypothyroidism: Continue levothyroxine-given suppressed TSH-I have decreased dosage to 50 mcg-repeat TSH in 3 months.  HTN: BP stable-continue manidipine and hydralazine.   HLD: Continue statin.  Obesity: Estimated body mass index is 35.44 kg/m as calculated from the following:   Height as of this encounter: 5\' 2"  (1.575 m).   Weight as of this encounter: 87.9 kg.   RN pressure injury documentation: Pressure Injury 08/20/20 Coccyx Mid Stage 1 -  Intact skin with non-blanchable redness of a localized area usually over a bony prominence. (Active)  08/20/20 0800  Location: Coccyx  Location Orientation: Mid  Staging: Stage 1 -  Intact skin with non-blanchable redness of a localized area usually over  a bony prominence.  Wound Description (Comments):   Present on Admission: Yes    GI prophylaxis: PPI  ABG:    Component Value Date/Time   PHART 7.266 (L) 08/19/2020 0808   PCO2ART 32.3 08/19/2020 0808   PO2ART 186 (H) 08/19/2020 0808   HCO3 19.0 (L) 08/20/2020 1101   TCO2 16 (L) 08/19/2020 0808   ACIDBASEDEF 6.4 (H) 08/20/2020 1101   O2SAT 85.1 08/20/2020 1101    Vent Settings: N/A FiO2 (%):  [100 %] 100 %  Condition - Extremely Guarded  Family Communication  :  Daughter Mendel Ryder [phone] updated on 1/27  Code Status :  Full Code  Diet :  Diet Order            Diet Carb Modified Fluid consistency: Thin; Room service appropriate? Yes  Diet effective now                  Disposition Plan  :   Status is: Inpatient  Remains inpatient appropriate because:Inpatient level of care appropriate due to severity of illness   Dispo: The patient is from: Home              Anticipated d/c is to: SNF              Anticipated d/c date is: > 3 days              Patient currently is not medically stable to d/c.   Barriers to discharge:  Worsening hypoxia requiring O2 supplementation  Antimicorbials  :    Anti-infectives (From admission, onward)   Start     Dose/Rate Route Frequency Ordered Stop   09/07/20 1030  vancomycin (VANCOCIN) IVPB 1000 mg/200 mL premix        1,000 mg 200 mL/hr over 60 Minutes Intravenous Every 48 hours 09/05/20 0933     09/05/20 1030  vancomycin (VANCOREADY) IVPB 2000 mg/400 mL        2,000 mg 200 mL/hr over 120 Minutes Intravenous  Once 09/05/20 0933     09/05/20 1030  ceFEPIme (MAXIPIME) 2 g in sodium chloride 0.9 % 100 mL IVPB        2 g 200 mL/hr over 30 Minutes Intravenous Every 24 hours 09/05/20 0933     08/20/20 1445  cefTRIAXone (ROCEPHIN) 1 g in sodium chloride 0.9 % 100 mL IVPB        1 g 200 mL/hr over 30 Minutes Intravenous Every 24 hours 08/20/20 1348 08/26/20 2147   08/20/20 1000  remdesivir 100 mg in sodium chloride 0.9 % 100 mL IVPB       "Followed by" Linked Group Details   100 mg 200 mL/hr over 30 Minutes Intravenous Daily 08/19/20 0021 08/23/20 1113   08/19/20 0130  remdesivir 200 mg in sodium chloride 0.9% 250 mL IVPB       "Followed by" Linked Group Details   200 mg 580 mL/hr over 30 Minutes Intravenous Once 08/19/20 0021 08/19/20 0314      Inpatient Medications  Scheduled Meds: . amLODipine  10 mg Oral Daily  . vitamin C  500 mg Oral Daily  . aspirin EC  81 mg Oral Daily  . atorvastatin  40 mg Oral Daily  . baricitinib  1 mg Oral Daily  . enoxaparin (LOVENOX) injection  30 mg Subcutaneous Q24H  . feeding supplement (NEPRO CARB STEADY)  237 mL Oral BID BM  . fluticasone  1 spray Each Nare Daily  . furosemide  60 mg Intravenous  Once  . hydrALAZINE  25 mg Oral Q8H  . insulin aspart  0-15 Units Subcutaneous TID WC  . insulin aspart  4 Units Subcutaneous TID WC  . insulin detemir  14 Units Subcutaneous q morning - 10a  . insulin detemir  7 Units Subcutaneous QHS  . levothyroxine  50 mcg Oral Q0600  . mouth rinse  15 mL Mouth Rinse BID  . methylPREDNISolone  (SOLU-MEDROL) injection  40 mg Intravenous Q12H  . nitroGLYCERIN  0.5 inch Topical Q6H  . pantoprazole  40 mg Oral BID  . tacrolimus  3 mg Oral QHS  . tacrolimus  4 mg Oral Daily  . zinc sulfate  220 mg Oral Daily   Continuous Infusions: . sodium chloride    . ceFEPime (MAXIPIME) IV    . sodium chloride Stopped (08/31/20 1545)  . [START ON 09/07/2020] vancomycin    . vancomycin     PRN Meds:.sodium chloride, acetaminophen **OR** acetaminophen, albuterol, guaiFENesin, hydrALAZINE, loperamide, morphine injection, nitroGLYCERIN, ondansetron (ZOFRAN) IV, polyethylene glycol, sodium chloride   Time Spent in minutes  45   The patient is critically ill with multiple organ system failure and requires high complexity decision making for assessment and support, frequent evaluation and titration of therapies, advanced monitoring, review of radiographic studies and interpretation of complex data.    See all Orders from today for further details   Oren Binet M.D on 09/05/2020 at 9:38 AM  To page go to [email or web address] - use universal password  [facility] -  Office  [phone]    Objective:   Vitals:   09/05/20 0615 09/05/20 0616 09/05/20 0659 09/05/20 0752  BP:    114/60  Pulse:    (!) 107  Resp:    20  Temp:    97.8 F (36.6 C)  TempSrc:    Oral  SpO2: 90% 99%  93%  Weight:   87.9 kg   Height:        Wt Readings from Last 3 Encounters:  09/05/20 87.9 kg  05/14/20 92.5 kg  05/14/20 92.9 kg     Intake/Output Summary (Last 24 hours) at 09/05/2020 5176 Last data filed at 09/04/2020 2200 Gross per 24 hour  Intake 240 ml  Output --  Net 240 ml     Physical Exam Gen Exam: Tachypneic-but not in acute distress.  HEENT:atraumatic, normocephalic Chest: Bibasilar rales CVS:S1S2 regular Abdomen:soft non tender, non distended Extremities:no edema Neurology: Non focal Skin: no rash   Data Review:    CBC Recent Labs  Lab 09/01/20 0022 09/02/20 0202  09/03/20 0059 09/04/20 0131 09/05/20 0129  WBC 23.0* 19.3* 14.0* 11.8* 12.0*  HGB 10.9* 9.8* 7.9* 7.6* 8.6*  HCT 32.6* 29.7* 24.6* 23.1* 27.0*  PLT 107* 115* 103* 101* 105*  MCV 76.7* 78.6* 78.3* 77.5* 79.4*  MCH 25.6* 25.9* 25.2* 25.5* 25.3*  MCHC 33.4 33.0 32.1 32.9 31.9  RDW 16.2* 16.5* 16.3* 16.4* 17.4*    Chemistries  Recent Labs  Lab 08/31/20 0135 09/01/20 0022 09/02/20 0202 09/03/20 0059 09/04/20 0131 09/05/20 0129  NA 140 140 140 141 140 140  K 4.5 4.8 5.2* 4.4 4.0 4.4  CL 105 108 107 106 105 107  CO2 25 22 23 25 26 22   GLUCOSE 162* 222* 185* 143* 66* 98  BUN 66* 59* 63* 61* 53* 48*  CREATININE 1.87* 1.96* 1.96* 2.04* 2.11* 2.12*  CALCIUM 9.8 9.7 9.7 9.7 9.6 9.6  AST 47* 40 97* 111* 56*  --   ALT 118* 102*  249* 285* 220*  --   ALKPHOS 92 74 102 117 111  --   BILITOT 0.9 0.5 0.6 0.7 1.0  --    ------------------------------------------------------------------------------------------------------------------ No results for input(s): CHOL, HDL, LDLCALC, TRIG, CHOLHDL, LDLDIRECT in the last 72 hours.  Lab Results  Component Value Date   HGBA1C 8.2 (H) 08/19/2020   ------------------------------------------------------------------------------------------------------------------ No results for input(s): TSH, T4TOTAL, T3FREE, THYROIDAB in the last 72 hours.  Invalid input(s): FREET3 ------------------------------------------------------------------------------------------------------------------ No results for input(s): VITAMINB12, FOLATE, FERRITIN, TIBC, IRON, RETICCTPCT in the last 72 hours.  Coagulation profile No results for input(s): INR, PROTIME in the last 168 hours.  Recent Labs    09/04/20 0131 09/05/20 0129  DDIMER <0.27 0.40    Cardiac Enzymes No results for input(s): CKMB, TROPONINI, MYOGLOBIN in the last 168 hours.  Invalid input(s):  CK ------------------------------------------------------------------------------------------------------------------    Component Value Date/Time   BNP 107.0 (H) 09/05/2020 0129    Micro Results No results found for this or any previous visit (from the past 240 hour(s)).  Radiology Reports CT CHEST WO CONTRAST  Result Date: 08/24/2020 CLINICAL DATA:  Severe hypoxia and respiratory failure with COVID-19 infection. EXAM: CT CHEST WITHOUT CONTRAST TECHNIQUE: Multidetector CT imaging of the chest was performed following the standard protocol without IV contrast. COMPARISON:  Chest x-ray 08/18/2020 FINDINGS: Cardiovascular: Mild cardiomegaly. Mild calcification over the mitral valve annulus. Thoracic aorta is normal caliber. Stent over the right subclavian/axillary region. Remaining vascular structures are unremarkable. Mediastinum/Nodes: No evidence of mediastinal or hilar adenopathy. Remaining mediastinal structures are unremarkable. Lungs/Pleura: Lungs are adequately inflated demonstrate moderate bilateral hazy airspace process likely multifocal viral pneumonia in this COVID-19 positive patient. No evidence of effusion. Airways are otherwise unremarkable. Upper Abdomen: No acute findings. Musculoskeletal: Degenerative change of the spine. Mild anterior wedging of a midthoracic vertebral body likely chronic. IMPRESSION: 1. Moderate bilateral hazy airspace process likely multifocal viral pneumonia in this COVID-19 positive patient. 2. Mild cardiomegaly. 3. Mild anterior wedging of a midthoracic vertebral body likely chronic. Electronically Signed   By: Marin Olp M.D.   On: 08/24/2020 12:37   US Renal Transplant w/Doppler  Result Date: 08/19/2020 CLINICAL DATA:  Acute kidney injury EXAM: ULTRASOUND OF RENAL TRANSPLANT WITH RENAL DOPPLER ULTRASOUND TECHNIQUE: Ultrasound examination of the renal transplant was performed with gray-scale, color and duplex doppler evaluation. COMPARISON:  None. FINDINGS:  Transplant kidney location: Left lower quadrant Transplant Kidney: Renal measurements: 10.8 x 6.2 x 6.5 cm = volume: 228mL. Normal in size and parenchymal echogenicity. No evidence of mass or hydronephrosis. No peri-transplant fluid collection seen. Color flow in the main renal artery:  Yes Color flow in the main renal vein:  Yes Duplex Doppler Evaluation: Main Renal Artery Velocity:  cm/sec Main Renal Artery Resistive Index: 0.99 Venous waveform in main renal vein:  Present Intrarenal resistive index in upper pole:  0.98 (normal 0.6-0.8; equivocal 0.8-0.9; abnormal >= 0.9) Intrarenal resistive index in lower pole: 0.98 (normal 0.6-0.8; equivocal 0.8-0.9; abnormal >= 0.9) Bladder: Normal for degree of bladder distention. Other findings:  None. IMPRESSION: Elevated resistive indices within the left lower quadrant transplant kidney. No hydronephrosis. Electronically Signed   By: Rolm Baptise M.D.   On: 08/19/2020 19:15   DG CHEST PORT 1 VIEW  Result Date: 08/31/2020 CLINICAL DATA:  65 year old female with shortness of breath. EXAM: PORTABLE CHEST 1 VIEW COMPARISON:  Chest radiograph dated 08/18/2020. FINDINGS: Bilateral confluent and reticular densities, progressed since the prior radiograph and most consistent with multifocal pneumonia. No pleural effusion pneumothorax. Borderline  cardiomegaly. No acute osseous pathology. IMPRESSION: Interval progression of bilateral pulmonary opacities compared to the radiograph of 08/18/2020. Continued follow-up recommended. Electronically Signed   By: Anner Crete M.D.   On: 08/31/2020 21:17   DG Chest Portable 1 View  Result Date: 08/18/2020 CLINICAL DATA:  65 year old female with history of COVID infection. Chest pain. Shortness of breath. EXAM: PORTABLE CHEST 1 VIEW COMPARISON:  Chest x-ray 10/20/2014. FINDINGS: Patchy multifocal airspace consolidation and areas of interstitial prominence are noted throughout the lungs bilaterally, most severe throughout the  periphery of the mid to lower left lung, indicative of multilobar bilateral pneumonia. No definite pleural effusions. No evidence of pulmonary edema. No pneumothorax. Heart size is borderline enlarged. Upper mediastinal contours are within normal limits. Atherosclerotic calcifications in the thoracic aorta. Vascular stent projecting over the right subclavian region. IMPRESSION: 1. Severe multilobar bilateral (left greater than right) pneumonia compatible with reported COVID infection. 2. Aortic atherosclerosis. Electronically Signed   By: Vinnie Langton M.D.   On: 08/18/2020 18:02   DG Chest Port 1V same Day  Result Date: 09/05/2020 CLINICAL DATA:  Shortness of breath, COVID positive EXAM: PORTABLE CHEST 1 VIEW COMPARISON:  08/31/2020 FINDINGS: Persistent diffuse bilateral pulmonary opacities. Aeration has decreased since the prior study. No significant pleural effusion. No pneumothorax. Stable cardiomediastinal contours. IMPRESSION: Progression of diffuse bilateral pulmonary opacities. Electronically Signed   By: Macy Mis M.D.   On: 09/05/2020 08:05   ECHOCARDIOGRAM LIMITED  Result Date: 09/02/2020    ECHOCARDIOGRAM LIMITED REPORT   Patient Name:   [facility] Date of Exam: 09/02/2020 Medical Rec #:  [id number]               Height:       62.0 in Accession #:    [id number]              Weight:       194.4 lb Date of Birth:  1956/03/13              BSA:          1.889 m Patient Age:    46 years                BP:           135/66 mmHg Patient Gender: F                       HR:           100 bpm. Exam Location:  Inpatient Procedure: Limited Echo, Limited Color Doppler and Cardiac Doppler Indications:    chest pain  History:        Patient has prior history of Echocardiogram examinations, most                 recent 09/30/2006. Covid; Risk Factors:Diabetes and Hypertension.  Sonographer:    Johny Chess Referring Phys: [place]  1. Left ventricular ejection  fraction, by estimation, is >75%. The left ventricle has hyperdynamic function. The left ventricle has no regional wall motion abnormalities. There is moderate left ventricular hypertrophy. Left ventricular diastolic parameters are consistent with Grade I diastolic dysfunction (impaired relaxation).  2. Right ventricular systolic function is normal. The right ventricular size is normal.  3. The mitral valve is normal in structure. No evidence of mitral valve regurgitation. No evidence of mitral stenosis. Moderate mitral annular calcification.  4. The aortic valve is tricuspid. Aortic valve regurgitation is not visualized.  Mild aortic valve sclerosis is present, with no evidence of aortic valve stenosis.  5. The inferior vena cava is normal in size with greater than 50% respiratory variability, suggesting right atrial pressure of 3 mmHg. FINDINGS  Left Ventricle: Left ventricular ejection fraction, by estimation, is >75%. The left ventricle has hyperdynamic function. The left ventricle has no regional wall motion abnormalities. The left ventricular internal cavity size was normal in size. There is moderate left ventricular hypertrophy. Left ventricular diastolic parameters are consistent with Grade I diastolic dysfunction (impaired relaxation). Right Ventricle: The right ventricular size is normal. Right ventricular systolic function is normal. Left Atrium: Left atrial size was normal in size. Right Atrium: Right atrial size was normal in size. Pericardium: There is no evidence of pericardial effusion. Mitral Valve: The mitral valve is normal in structure. Moderate mitral annular calcification. No evidence of mitral valve stenosis. Tricuspid Valve: The tricuspid valve is normal in structure. Tricuspid valve regurgitation is trivial. No evidence of tricuspid stenosis. Aortic Valve: The aortic valve is tricuspid. Aortic valve regurgitation is not visualized. Mild aortic valve sclerosis is present, with no evidence of  aortic valve stenosis. Pulmonic Valve: The pulmonic valve was normal in structure. Pulmonic valve regurgitation is not visualized. No evidence of pulmonic stenosis. Aorta: The aortic root is normal in size and structure. Venous: The inferior vena cava is normal in size with greater than 50% respiratory variability, suggesting right atrial pressure of 3 mmHg. IAS/Shunts: No atrial level shunt detected by color flow Doppler. LEFT VENTRICLE PLAX 2D LVIDd:         3.70 cm  Diastology LVIDs:         2.50 cm  LV e' medial:    5.33 cm/s LV PW:         1.00 cm  LV E/e' medial:  13.8 LV IVS:        0.90 cm  LV e' lateral:   5.66 cm/s LVOT diam:     1.70 cm  LV E/e' lateral: 13.0 LV SV:         59 LV SV Index:   31 LVOT Area:     2.27 cm  IVC IVC diam: 1.30 cm LEFT ATRIUM         Index LA diam:    3.90 cm 2.06 cm/m  AORTIC VALVE LVOT Vmax:   170.00 cm/s LVOT Vmean:  114.000 cm/s LVOT VTI:    0.258 m  AORTA Ao Asc diam: 2.60 cm MV E velocity: 73.50 cm/s MV A velocity: 121.00 cm/s  SHUNTS MV E/A ratio:  0.61         Systemic VTI:  0.26 m                             Systemic Diam: 1.70 cm Kirk Ruths MD Electronically signed by Kirk Ruths MD Signature Date/Time: 09/02/2020/12:26:37 PM    Final

## 2020-09-06 DIAGNOSIS — U071 COVID-19: Secondary | ICD-10-CM | POA: Diagnosis not present

## 2020-09-06 DIAGNOSIS — J9601 Acute respiratory failure with hypoxia: Secondary | ICD-10-CM | POA: Diagnosis not present

## 2020-09-06 LAB — CBC
HCT: 25.5 % — ABNORMAL LOW (ref 36.0–46.0)
Hemoglobin: 8.3 g/dL — ABNORMAL LOW (ref 12.0–15.0)
MCH: 25.7 pg — ABNORMAL LOW (ref 26.0–34.0)
MCHC: 32.5 g/dL (ref 30.0–36.0)
MCV: 78.9 fL — ABNORMAL LOW (ref 80.0–100.0)
Platelets: 120 10*3/uL — ABNORMAL LOW (ref 150–400)
RBC: 3.23 MIL/uL — ABNORMAL LOW (ref 3.87–5.11)
RDW: 18.1 % — ABNORMAL HIGH (ref 11.5–15.5)
WBC: 14.1 10*3/uL — ABNORMAL HIGH (ref 4.0–10.5)
nRBC: 0 % (ref 0.0–0.2)

## 2020-09-06 LAB — HEPATIC FUNCTION PANEL
ALT: 176 U/L — ABNORMAL HIGH (ref 0–44)
AST: 50 U/L — ABNORMAL HIGH (ref 15–41)
Albumin: 3.1 g/dL — ABNORMAL LOW (ref 3.5–5.0)
Alkaline Phosphatase: 156 U/L — ABNORMAL HIGH (ref 38–126)
Bilirubin, Direct: 0.4 mg/dL — ABNORMAL HIGH (ref 0.0–0.2)
Indirect Bilirubin: 0.9 mg/dL (ref 0.3–0.9)
Total Bilirubin: 1.3 mg/dL — ABNORMAL HIGH (ref 0.3–1.2)
Total Protein: 6.1 g/dL — ABNORMAL LOW (ref 6.5–8.1)

## 2020-09-06 LAB — C-REACTIVE PROTEIN: CRP: <0.5 mg/dL (ref <1.0)

## 2020-09-06 LAB — RENAL FUNCTION PANEL
Albumin: 3.1 g/dL — ABNORMAL LOW (ref 3.5–5.0)
Anion gap: 13 (ref 5–15)
BUN: 48 mg/dL — ABNORMAL HIGH (ref 8–23)
CO2: 23 mmol/L (ref 22–32)
Calcium: 9.9 mg/dL (ref 8.9–10.3)
Chloride: 103 mmol/L (ref 98–111)
Creatinine, Ser: 2.46 mg/dL — ABNORMAL HIGH (ref 0.44–1.00)
GFR, Estimated: 21 mL/min — ABNORMAL LOW (ref >60)
Glucose, Bld: 146 mg/dL — ABNORMAL HIGH (ref 70–99)
Phosphorus: 5.5 mg/dL — ABNORMAL HIGH (ref 2.5–4.6)
Potassium: 4.3 mmol/L (ref 3.5–5.1)
Sodium: 139 mmol/L (ref 135–145)

## 2020-09-06 LAB — D-DIMER, QUANTITATIVE: D-Dimer, Quant: 0.43 ug/mL-FEU (ref 0.00–0.50)

## 2020-09-06 LAB — GLUCOSE, CAPILLARY
Glucose-Capillary: 154 mg/dL — ABNORMAL HIGH (ref 70–99)
Glucose-Capillary: 160 mg/dL — ABNORMAL HIGH (ref 70–99)
Glucose-Capillary: 182 mg/dL — ABNORMAL HIGH (ref 70–99)
Glucose-Capillary: 215 mg/dL — ABNORMAL HIGH (ref 70–99)

## 2020-09-06 NOTE — Progress Notes (Signed)
PROGRESS NOTE                                                                                                                                                                                                             Patient Demographics:    [name] [name], is a 65 y.o. female, DOB - 05-28-1956, FKC:[id number]  Outpatient Primary MD for the patient is Glendale Chard, MD   Admit date - 08/18/2020   LOS - 42  Chief Complaint  Patient presents with  . Covid Positive  . Chest Pain  . Weakness       Brief Narrative: Patient is a 65 y.o. female with PMHx of renal transplant x2 (latest in 2016) on chronic immunosuppressive agents, DM-2, hypothyroidism-who presented with shortness of breath-found to have acute hypoxic respiratory failure due to COVID-19 pneumonia.  Hospital course complicated by slow improvement from severe hypoxemia, AKI and non-STEMI.  COVID-19 vaccinated status: Vaccinated including booster  Significant Events: 1/10>> Admit to [facility] for acute hypoxic respiratory failure due to COVID-19 pneumonia 1/11>> transfer to ICU due to worsening hypoxemia, AKI/metabolic acidosis/hyperkalemia. 1/14>> transferred to [facility] 1/23>> chest pain-non-STEMI-cardiology consult 1/25>> chest pain-resolved by SL nitroglycerin x1 1/28>> tachypneic- worsening hypoxemia-now on 15 L via NRB and 15 L via HFNC  Significant studies: 1/10>> chest x-ray: Severe multilobar bilateral pneumonia. 1/11>> ultrasound renal transplant: No hydronephrosis 1/16>> CT chest: Multifocal viral pneumonia 1/26>>Echo: EF> 70%, LVH, grade 1 diastolic dysfunction 7/49>> chest x-ray: Progression of diffuse bilateral pulmonary opacitie   COVID-19 medications: Steroids: 1/10>> Remdesivir: 1/10>>1/15 Baricitinib: 1/16>>  Antibiotics: Rocephin: 1/12>> 1/18 Cefepime: 1/28>> Vancomycin: 1/28>>  Microbiology data: 1/10 >>blood culture: No  growth 1/11>> urine culture: Klebsiella pneumoniae  Procedures: None  Consults: PCCM, nephrology, cardiology  DVT prophylaxis: enoxaparin (LOVENOX) injection 30 mg Start: 09/03/20 1400 SCDs Start: 08/19/20 0933    Subjective:   Patient in bed, appears comfortable, denies any headache, no fever, no chest pain or pressure, ++ shortness of breath , no abdominal pain. No focal weakness.    Assessment  & Plan :   Acute Hypoxic Resp Failure due to Covid 19 Viral pneumonia: Had gradually improved since admission-but now rapid worsening overnight. Although tachypneic-stable and not in acute distress-on 15 L via NRB and 15 L via HFNC. Chest x-ray with significant diffuse bilateral infiltrates-no pneumothorax. Difficult situation-severely immunocompromised-although no signs  of volume overload-we will go ahead and give 1 dose of IV Lasix, empirically start her on vancomycin/cefepime (check MRSA PCR) and change from prednisone-back to IV Solu-Medrol. Long talk with patient at bedside-and then with daughter over the phone-both understand the tenuous nature of the situation-explained that if her respiratory failure progresses-recommendations are that she not be placed on a ventilator-as she probably has a significantly high mortality given how late into her illness she is. Recommended that we continue with IV antibiotics, IV steroids and as needed diuretics-follow clinical course-and if she deteriorates-consider hospice care.   DW daughter, medical Rx, if worse C.Care.  Prone/Incentive Spirometry: encouraged  incentive spirometry use 3-4/hour.  O2 requirements:   SpO2: 94 % O2 Flow Rate (L/min): 15 L/min FiO2 (%): 100 %     Recent Labs  Lab 08/31/20 2212 09/01/20 0022 09/02/20 0202 09/03/20 0059 09/04/20 0131 09/05/20 0129 09/05/20 0821 09/05/20 1110 09/06/20 0428  WBC  --  23.0* 19.3* 14.0* 11.8* 12.0*  --   --  14.1*  HGB  --  10.9* 9.8* 7.9* 7.6* 8.6*  --   --  8.3*  HCT  --   32.6* 29.7* 24.6* 23.1* 27.0*  --   --  25.5*  PLT  --  107* 115* 103* 101* 105*  --   --  120*  CRP  --  0.6 0.9 0.7 0.6  --  0.6  --  <0.5  BNP 235.3*  --   --   --   --  107.0*  --   --   --   DDIMER  --  0.53* 0.52* 0.35 <0.27 0.40  --   --  0.43  PROCALCITON  --   --   --   --   --   --   --  <0.10  --   AST  --  40 97* 111* 56*  --   --   --  50*  ALT  --  102* 249* 285* 220*  --   --   --  176*  ALKPHOS  --  74 102 117 111  --   --   --  156*  BILITOT  --  0.5 0.6 0.7 1.0  --   --   --  1.3*  ALBUMIN  --  2.8* 2.8* 2.6* 2.7* 3.0*  --   --  3.1*  3.1*     Sinus pause: Occurred on 1/20-approximately 4.25 seconds-this occurred while she was sleeping-after discussion with cardiology-beta-blocker discontinued.  Needs outpatient polysomnography.  Non-STEMI: Developed severe chest pain on 1/23 with EKG changes-relieved with nitroglycerin and morphine infusion-chest pain reoccurred on 1/25.  Echo is reassuring with preserved EF.  On aspirin/statin-has completed the 48 hours of IV heparin.  Cardiology has signed off  AKI on CKD stage IV-transplanted kidney (second renal transplant-01/2015): AKI hemodynamically mediated-creatinine has overall improved-and plateaued to around 1.9 range-but lately has started to trend up. Nephrology reconsulted-Myfortic remains on hold-on Prograf and steroids-Prograf levels pending. Discussed earlier this morning with Dr. Dan Humphreys to try IV diuretics given tenuous respiratory status to see if this improves her tachypnea/hypoxia.  Hyperkalemia/anion gap metabolic acidosis: Secondary to AKI-has resolved.  Transaminitis: LFTs gradually getting better-continue to monitor closely-watch closely as on statins.    Anemia: Due to critical illness and CKD-watch closely-no evidence of blood loss-had brown-colored stools the day before per patient. Transfuse if less than 7. Yesterday per patient.    Thrombocytopenia: Mild-stable-gradually improving-watch closely.    Complicated UTI: Completed a  course of Rocephin.  Afebrile and nontoxic-appearing.  DKA: Resolved-no longer on insulin drip.  DM-2 (A1c 8.2 on 1/11) with uncontrolled hyperglycemia due to steroids: Hypoglycemic episode reoccurred this morning-Levemir dosage has been adjusted for the past few days-steroid dosage significantly escalated today-suspect at risk for hypoglycemia. Continue Levemir at current doses-watch closely-if hypoglycemia reoccurs-may need to adjust further.  Recent Labs    09/05/20 1720 09/05/20 2100 09/06/20 0810  GLUCAP 217* 182* 154*    Hypothyroidism: Continue levothyroxine-given suppressed TSH-I have decreased dosage to 50 mcg-repeat TSH in 3 months.  HTN: BP stable-continue manidipine and hydralazine.   HLD: Continue statin.  Obesity: Estimated body mass index is 35.44 kg/m as calculated from the following:   Height as of this encounter: 5\' 2"  (1.575 m).   Weight as of this encounter: 87.9 kg.   RN pressure injury documentation: Pressure Injury 08/20/20 Coccyx Mid Stage 1 -  Intact skin with non-blanchable redness of a localized area usually over a bony prominence. (Active)  08/20/20 0800  Location: Coccyx  Location Orientation: Mid  Staging: Stage 1 -  Intact skin with non-blanchable redness of a localized area usually over a bony prominence.  Wound Description (Comments):   Present on Admission: Yes    GI prophylaxis: PPI  Condition - Extremely Guarded  Family Communication  :  Daughter Mendel Ryder [phone] updated on 09/04/20, 09/06/20  Code Status :  Full Code  Diet :  Diet Order            Diet Carb Modified Fluid consistency: Thin; Room service appropriate? Yes  Diet effective now                  Disposition Plan  :   Status is: Inpatient  Remains inpatient appropriate because:Inpatient level of care appropriate due to severity of illness   Dispo: The patient is from: Home              Anticipated d/c is to: SNF               Anticipated d/c date is: > 3 days              Patient currently is not medically stable to d/c.   Barriers to discharge: Worsening hypoxia requiring O2 supplementation  Antimicorbials  :    Anti-infectives (From admission, onward)   Start     Dose/Rate Route Frequency Ordered Stop   09/07/20 1030  vancomycin (VANCOCIN) IVPB 1000 mg/200 mL premix        1,000 mg 200 mL/hr over 60 Minutes Intravenous Every 48 hours 09/05/20 0933     09/05/20 1030  vancomycin (VANCOREADY) IVPB 2000 mg/400 mL        2,000 mg 200 mL/hr over 120 Minutes Intravenous  Once 09/05/20 0933 09/05/20 2018   09/05/20 1030  ceFEPIme (MAXIPIME) 2 g in sodium chloride 0.9 % 100 mL IVPB        2 g 200 mL/hr over 30 Minutes Intravenous Every 24 hours 09/05/20 0933     08/20/20 1445  cefTRIAXone (ROCEPHIN) 1 g in sodium chloride 0.9 % 100 mL IVPB        1 g 200 mL/hr over 30 Minutes Intravenous Every 24 hours 08/20/20 1348 08/26/20 2147   08/20/20 1000  remdesivir 100 mg in sodium chloride 0.9 % 100 mL IVPB       "Followed by" Linked Group Details   100 mg 200 mL/hr over 30 Minutes Intravenous Daily 08/19/20 0021 08/23/20 1113  08/19/20 0130  remdesivir 200 mg in sodium chloride 0.9% 250 mL IVPB       "Followed by" Linked Group Details   200 mg 580 mL/hr over 30 Minutes Intravenous Once 08/19/20 0021 08/19/20 0314      Inpatient Medications  Scheduled Meds: . amLODipine  10 mg Oral Daily  . vitamin C  500 mg Oral Daily  . aspirin EC  81 mg Oral Daily  . atorvastatin  40 mg Oral Daily  . enoxaparin (LOVENOX) injection  30 mg Subcutaneous Q24H  . feeding supplement (NEPRO CARB STEADY)  237 mL Oral BID BM  . fluticasone  1 spray Each Nare Daily  . hydrALAZINE  25 mg Oral Q8H  . insulin aspart  0-15 Units Subcutaneous TID WC  . insulin aspart  4 Units Subcutaneous TID WC  . insulin detemir  14 Units Subcutaneous q morning - 10a  . insulin detemir  7 Units Subcutaneous QHS  . levothyroxine  50 mcg Oral Q0600   . mouth rinse  15 mL Mouth Rinse BID  . methylPREDNISolone (SOLU-MEDROL) injection  40 mg Intravenous Q12H  . nitroGLYCERIN  0.5 inch Topical Q6H  . pantoprazole  40 mg Oral BID  . tacrolimus  3 mg Oral QHS  . tacrolimus  4 mg Oral Daily  . zinc sulfate  220 mg Oral Daily   Continuous Infusions: . sodium chloride    . ceFEPime (MAXIPIME) IV 2 g (09/06/20 1115)  . sodium chloride Stopped (08/31/20 1545)  . [START ON 09/07/2020] vancomycin     PRN Meds:.sodium chloride, acetaminophen **OR** acetaminophen, albuterol, guaiFENesin, hydrALAZINE, loperamide, morphine injection, nitroGLYCERIN, ondansetron (ZOFRAN) IV, polyethylene glycol, sodium chloride   Time Spent in minutes  45   The patient is critically ill with multiple organ system failure and requires high complexity decision making for assessment and support, frequent evaluation and titration of therapies, advanced monitoring, review of radiographic studies and interpretation of complex data.    See all Orders from today for further details   Lala Lund M.D on 09/06/2020 at 11:24 AM  To page go to [email or web address] - use universal password  [facility] -  Office  [phone]    Objective:   Vitals:   09/06/20 0807 09/06/20 0915 09/06/20 1006 09/06/20 1107  BP: 130/68 (!) 120/59 137/69 (!) 143/74  Pulse: (!) 119  (!) 110 (!) 113  Resp: (!) 29 (!) 27 (!) 26 (!) 31  Temp: 97.7 F (36.5 C) (!) 97.2 F (36.2 C) 97.7 F (36.5 C) (!) 97 F (36.1 C)  TempSrc: Axillary Axillary Oral Axillary  SpO2: 93% 97% 98% 94%  Weight:      Height:        Wt Readings from Last 3 Encounters:  09/05/20 87.9 kg  05/14/20 92.5 kg  05/14/20 92.9 kg     Intake/Output Summary (Last 24 hours) at 09/06/2020 1124 Last data filed at 09/05/2020 2018 Gross per 24 hour  Intake 355 ml  Output 350 ml  Net 5 ml     Physical Exam  Awake Alert, No new F.N deficits, Normal affect [place].AT,PERRAL Supple Neck,No JVD, No cervical  lymphadenopathy appriciated.  Symmetrical Chest wall movement, Good air movement bilaterally, CTAB RRR,No Gallops, Rubs or new Murmurs, No Parasternal Heave +ve B.Sounds, Abd Soft, No tenderness, No organomegaly appriciated, No rebound - guarding or rigidity. No Cyanosis, Clubbing or edema, No new Rash or bruise    Data Review:    CBC Recent Labs  Lab 09/02/20 0202  09/03/20 0059 09/04/20 0131 09/05/20 0129 09/06/20 0428  WBC 19.3* 14.0* 11.8* 12.0* 14.1*  HGB 9.8* 7.9* 7.6* 8.6* 8.3*  HCT 29.7* 24.6* 23.1* 27.0* 25.5*  PLT 115* 103* 101* 105* 120*  MCV 78.6* 78.3* 77.5* 79.4* 78.9*  MCH 25.9* 25.2* 25.5* 25.3* 25.7*  MCHC 33.0 32.1 32.9 31.9 32.5  RDW 16.5* 16.3* 16.4* 17.4* 18.1*    Chemistries  Recent Labs  Lab 09/01/20 0022 09/02/20 0202 09/03/20 0059 09/04/20 0131 09/05/20 0129 09/06/20 0428  NA 140 140 141 140 140 139  K 4.8 5.2* 4.4 4.0 4.4 4.3  CL 108 107 106 105 107 103  CO2 22 23 25 26 22 23   GLUCOSE 222* 185* 143* 66* 98 146*  BUN 59* 63* 61* 53* 48* 48*  CREATININE 1.96* 1.96* 2.04* 2.11* 2.12* 2.46*  CALCIUM 9.7 9.7 9.7 9.6 9.6 9.9  AST 40 97* 111* 56*  --  50*  ALT 102* 249* 285* 220*  --  176*  ALKPHOS 74 102 117 111  --  156*  BILITOT 0.5 0.6 0.7 1.0  --  1.3*   ------------------------------------------------------------------------------------------------------------------ No results for input(s): CHOL, HDL, LDLCALC, TRIG, CHOLHDL, LDLDIRECT in the last 72 hours.  Lab Results  Component Value Date   HGBA1C 8.2 (H) 08/19/2020   ------------------------------------------------------------------------------------------------------------------ No results for input(s): TSH, T4TOTAL, T3FREE, THYROIDAB in the last 72 hours.  Invalid input(s): FREET3 ------------------------------------------------------------------------------------------------------------------ No results for input(s): VITAMINB12, FOLATE, FERRITIN, TIBC, IRON, RETICCTPCT in the  last 72 hours.  Coagulation profile No results for input(s): INR, PROTIME in the last 168 hours.  Recent Labs    09/05/20 0129 09/06/20 0428  DDIMER 0.40 0.43    Cardiac Enzymes No results for input(s): CKMB, TROPONINI, MYOGLOBIN in the last 168 hours.  Invalid input(s): CK ------------------------------------------------------------------------------------------------------------------    Component Value Date/Time   BNP 107.0 (H) 09/05/2020 0129    Micro Results No results found for this or any previous visit (from the past 240 hour(s)).  Radiology Reports CT CHEST WO CONTRAST  Result Date: 08/24/2020 CLINICAL DATA:  Severe hypoxia and respiratory failure with COVID-19 infection. EXAM: CT CHEST WITHOUT CONTRAST TECHNIQUE: Multidetector CT imaging of the chest was performed following the standard protocol without IV contrast. COMPARISON:  Chest x-ray 08/18/2020 FINDINGS: Cardiovascular: Mild cardiomegaly. Mild calcification over the mitral valve annulus. Thoracic aorta is normal caliber. Stent over the right subclavian/axillary region. Remaining vascular structures are unremarkable. Mediastinum/Nodes: No evidence of mediastinal or hilar adenopathy. Remaining mediastinal structures are unremarkable. Lungs/Pleura: Lungs are adequately inflated demonstrate moderate bilateral hazy airspace process likely multifocal viral pneumonia in this COVID-19 positive patient. No evidence of effusion. Airways are otherwise unremarkable. Upper Abdomen: No acute findings. Musculoskeletal: Degenerative change of the spine. Mild anterior wedging of a midthoracic vertebral body likely chronic. IMPRESSION: 1. Moderate bilateral hazy airspace process likely multifocal viral pneumonia in this COVID-19 positive patient. 2. Mild cardiomegaly. 3. Mild anterior wedging of a midthoracic vertebral body likely chronic. Electronically Signed   By: Marin Olp M.D.   On: 08/24/2020 12:37   US Renal Transplant  w/Doppler  Result Date: 08/19/2020 CLINICAL DATA:  Acute kidney injury EXAM: ULTRASOUND OF RENAL TRANSPLANT WITH RENAL DOPPLER ULTRASOUND TECHNIQUE: Ultrasound examination of the renal transplant was performed with gray-scale, color and duplex doppler evaluation. COMPARISON:  None. FINDINGS: Transplant kidney location: Left lower quadrant Transplant Kidney: Renal measurements: 10.8 x 6.2 x 6.5 cm = volume: 277mL. Normal in size and parenchymal echogenicity. No evidence of mass or hydronephrosis. No peri-transplant fluid  collection seen. Color flow in the main renal artery:  Yes Color flow in the main renal vein:  Yes Duplex Doppler Evaluation: Main Renal Artery Velocity:  cm/sec Main Renal Artery Resistive Index: 0.99 Venous waveform in main renal vein:  Present Intrarenal resistive index in upper pole:  0.98 (normal 0.6-0.8; equivocal 0.8-0.9; abnormal >= 0.9) Intrarenal resistive index in lower pole: 0.98 (normal 0.6-0.8; equivocal 0.8-0.9; abnormal >= 0.9) Bladder: Normal for degree of bladder distention. Other findings:  None. IMPRESSION: Elevated resistive indices within the left lower quadrant transplant kidney. No hydronephrosis. Electronically Signed   By: Rolm Baptise M.D.   On: 08/19/2020 19:15   DG CHEST PORT 1 VIEW  Result Date: 08/31/2020 CLINICAL DATA:  65 year old female with shortness of breath. EXAM: PORTABLE CHEST 1 VIEW COMPARISON:  Chest radiograph dated 08/18/2020. FINDINGS: Bilateral confluent and reticular densities, progressed since the prior radiograph and most consistent with multifocal pneumonia. No pleural effusion pneumothorax. Borderline cardiomegaly. No acute osseous pathology. IMPRESSION: Interval progression of bilateral pulmonary opacities compared to the radiograph of 08/18/2020. Continued follow-up recommended. Electronically Signed   By: Anner Crete M.D.   On: 08/31/2020 21:17   DG Chest Portable 1 View  Result Date: 08/18/2020 CLINICAL DATA:  65 year old female  with history of COVID infection. Chest pain. Shortness of breath. EXAM: PORTABLE CHEST 1 VIEW COMPARISON:  Chest x-ray 10/20/2014. FINDINGS: Patchy multifocal airspace consolidation and areas of interstitial prominence are noted throughout the lungs bilaterally, most severe throughout the periphery of the mid to lower left lung, indicative of multilobar bilateral pneumonia. No definite pleural effusions. No evidence of pulmonary edema. No pneumothorax. Heart size is borderline enlarged. Upper mediastinal contours are within normal limits. Atherosclerotic calcifications in the thoracic aorta. Vascular stent projecting over the right subclavian region. IMPRESSION: 1. Severe multilobar bilateral (left greater than right) pneumonia compatible with reported COVID infection. 2. Aortic atherosclerosis. Electronically Signed   By: Vinnie Langton M.D.   On: 08/18/2020 18:02   DG Chest Port 1V same Day  Result Date: 09/05/2020 CLINICAL DATA:  Shortness of breath, COVID positive EXAM: PORTABLE CHEST 1 VIEW COMPARISON:  08/31/2020 FINDINGS: Persistent diffuse bilateral pulmonary opacities. Aeration has decreased since the prior study. No significant pleural effusion. No pneumothorax. Stable cardiomediastinal contours. IMPRESSION: Progression of diffuse bilateral pulmonary opacities. Electronically Signed   By: Macy Mis M.D.   On: 09/05/2020 08:05   ECHOCARDIOGRAM LIMITED  Result Date: 09/02/2020    ECHOCARDIOGRAM LIMITED REPORT   Patient Name:   [facility] Date of Exam: 09/02/2020 Medical Rec #:  [id number]               Height:       62.0 in Accession #:    [id number]              Weight:       194.4 lb Date of Birth:  [birth year]              BSA:          1.889 m Patient Age:    55 years                BP:           135/66 mmHg Patient Gender: F                       HR:           100 bpm. Exam Location:  Inpatient Procedure: Limited  Echo, Limited Color Doppler and Cardiac Doppler Indications:     chest pain  History:        Patient has prior history of Echocardiogram examinations, most                 recent 09/30/2006. Covid; Risk Factors:Diabetes and Hypertension.  Sonographer:    Johny Chess Referring Phys: [place]  1. Left ventricular ejection fraction, by estimation, is >75%. The left ventricle has hyperdynamic function. The left ventricle has no regional wall motion abnormalities. There is moderate left ventricular hypertrophy. Left ventricular diastolic parameters are consistent with Grade I diastolic dysfunction (impaired relaxation).  2. Right ventricular systolic function is normal. The right ventricular size is normal.  3. The mitral valve is normal in structure. No evidence of mitral valve regurgitation. No evidence of mitral stenosis. Moderate mitral annular calcification.  4. The aortic valve is tricuspid. Aortic valve regurgitation is not visualized. Mild aortic valve sclerosis is present, with no evidence of aortic valve stenosis.  5. The inferior vena cava is normal in size with greater than 50% respiratory variability, suggesting right atrial pressure of 3 mmHg. FINDINGS  Left Ventricle: Left ventricular ejection fraction, by estimation, is >75%. The left ventricle has hyperdynamic function. The left ventricle has no regional wall motion abnormalities. The left ventricular internal cavity size was normal in size. There is moderate left ventricular hypertrophy. Left ventricular diastolic parameters are consistent with Grade I diastolic dysfunction (impaired relaxation). Right Ventricle: The right ventricular size is normal. Right ventricular systolic function is normal. Left Atrium: Left atrial size was normal in size. Right Atrium: Right atrial size was normal in size. Pericardium: There is no evidence of pericardial effusion. Mitral Valve: The mitral valve is normal in structure. Moderate mitral annular calcification. No evidence of mitral valve stenosis.  Tricuspid Valve: The tricuspid valve is normal in structure. Tricuspid valve regurgitation is trivial. No evidence of tricuspid stenosis. Aortic Valve: The aortic valve is tricuspid. Aortic valve regurgitation is not visualized. Mild aortic valve sclerosis is present, with no evidence of aortic valve stenosis. Pulmonic Valve: The pulmonic valve was normal in structure. Pulmonic valve regurgitation is not visualized. No evidence of pulmonic stenosis. Aorta: The aortic root is normal in size and structure. Venous: The inferior vena cava is normal in size with greater than 50% respiratory variability, suggesting right atrial pressure of 3 mmHg. IAS/Shunts: No atrial level shunt detected by color flow Doppler. LEFT VENTRICLE PLAX 2D LVIDd:         3.70 cm  Diastology LVIDs:         2.50 cm  LV e' medial:    5.33 cm/s LV PW:         1.00 cm  LV E/e' medial:  13.8 LV IVS:        0.90 cm  LV e' lateral:   5.66 cm/s LVOT diam:     1.70 cm  LV E/e' lateral: 13.0 LV SV:         59 LV SV Index:   31 LVOT Area:     2.27 cm  IVC IVC diam: 1.30 cm LEFT ATRIUM         Index LA diam:    3.90 cm 2.06 cm/m  AORTIC VALVE LVOT Vmax:   170.00 cm/s LVOT Vmean:  114.000 cm/s LVOT VTI:    0.258 m  AORTA Ao Asc diam: 2.60 cm MV E velocity: 73.50 cm/s MV A velocity: 121.00 cm/s  SHUNTS  MV E/A ratio:  0.61         Systemic VTI:  0.26 m                             Systemic Diam: 1.70 cm Kirk Ruths MD Electronically signed by Kirk Ruths MD Signature Date/Time: 09/02/2020/12:26:37 PM    Final

## 2020-09-06 NOTE — Progress Notes (Signed)
[facility] Progress Note   65 yo F with hx of DDRT x2 (latest 2016) on prograf, myfortic, and low-dose prednisone, T2DM, hypothyroidism, and recent diagnosis of COVID-19 presenting with acute hypoxic respiratory failure 2/2 COVID PNA. Also found to be in DKA with worsening AoCKD and hyperkalemia on labs.  Electrolyte disturbances improved.  Patient is slowly improving but creatinine remains consistently between 1.9 and 2.1.  Assessment:    1. AKI on CKD;  Cr presentation 4, nadir 1.9 1/17 to 1/24 then uptrending to 2.46 1/29 2. H/O DDKT x 2 (latest 2016). Baseline Cr ?1.7 but probably around 1.5-2.Follows w/ Dr. Marval Regal ([facility]) & [facility] 3. Chronic immunosuppressionTac/MMF/Pred 4. Hypoxic respiratory faiolure secondary COVID-19 PNA; acutely worsened 1/28 and broad spectrum antibiotics added 5. DKA, h/o uncontrolled DM2w/ hyperglycemia, resolved 6. Hyperkalemia, resolved.  7. Anion Gap Metabolic acidosis, resolved/stable on NaHCO3 8. HTN  Plan: -Creatinine rising for several days in the setting of worsening pulmonary status; not hugely volume depleted and she seems to be eating/drinking ok right now so I don't think a fluid challenge is indicated particularly in light of tenuous pulmonary status - Hold further diuretic dosing for today unless clinical change, doesn't appear pulmonary edema issue - pending tacrolimus trough to ensure it is not elevated -Continue monitor creatinine daily -Now holding MMF and restart once patient has improved further  Dispo - tenuous; code status changed to DNR yesterday.    Subjective:    Stable pulmonary status this AM compared to yesterday 326mL UOP documented    Objective:   BP (!) 124/58 (BP Location: Left Arm)   Pulse (!) 112   Temp (!) 97.3 F (36.3 C) (Axillary)   Resp (!) 25   Ht 5\' 2"  (1.575 m)   Wt 87.9 kg   SpO2 100%   BMI 35.44 kg/m   Intake/Output Summary (Last 24 hours) at 09/06/2020  1423 Last data filed at 09/05/2020 2018 Gross per 24 hour  Intake 235 ml  Output 350 ml  Net -115 ml   Weight change:   Physical Exam: GEN: NAD, NCAT HEENT: No conjunctival pallor, EOMI NECK: Supple, no thyromegaly LUNGS: bilateral chest rise, iwob present CV: normal rate, no audible murmur; hyperdynamic ABD: SNDNT +BS  EXT: No lower extremity edema, warm and well perfused ACCESS: rt BCF aneurysmal pulsatile   Imaging: DG Chest Port 1V same Day  Result Date: 09/05/2020 CLINICAL DATA:  Shortness of breath, COVID positive EXAM: PORTABLE CHEST 1 VIEW COMPARISON:  08/31/2020 FINDINGS: Persistent diffuse bilateral pulmonary opacities. Aeration has decreased since the prior study. No significant pleural effusion. No pneumothorax. Stable cardiomediastinal contours. IMPRESSION: Progression of diffuse bilateral pulmonary opacities. Electronically Signed   By: Macy Mis M.D.   On: 09/05/2020 08:05    Labs: BMET Recent Labs  Lab 08/31/20 0135 09/01/20 0022 09/02/20 0202 09/03/20 0059 09/04/20 0131 09/05/20 0129 09/06/20 0428  NA 140 140 140 141 140 140 139  K 4.5 4.8 5.2* 4.4 4.0 4.4 4.3  CL 105 108 107 106 105 107 103  CO2 25 22 23 25 26 22 23   GLUCOSE 162* 222* 185* 143* 66* 98 146*  BUN 66* 59* 63* 61* 53* 48* 48*  CREATININE 1.87* 1.96* 1.96* 2.04* 2.11* 2.12* 2.46*  CALCIUM 9.8 9.7 9.7 9.7 9.6 9.6 9.9  PHOS  --   --   --   --   --  3.4 5.5*   CBC Recent Labs  Lab 09/03/20 0059 09/04/20 0131 09/05/20 0129 09/06/20 0428  WBC 14.0* 11.8* 12.0* 14.1*  HGB 7.9* 7.6* 8.6* 8.3*  HCT 24.6* 23.1* 27.0* 25.5*  MCV 78.3* 77.5* 79.4* 78.9*  PLT 103* 101* 105* 120*    Medications:    . amLODipine  10 mg Oral Daily  . vitamin C  500 mg Oral Daily  . aspirin EC  81 mg Oral Daily  . atorvastatin  40 mg Oral Daily  . enoxaparin (LOVENOX) injection  30 mg Subcutaneous Q24H  . feeding supplement (NEPRO CARB STEADY)  237 mL Oral BID BM  . fluticasone  1 spray Each Nare  Daily  . hydrALAZINE  25 mg Oral Q8H  . insulin aspart  0-15 Units Subcutaneous TID WC  . insulin aspart  4 Units Subcutaneous TID WC  . insulin detemir  14 Units Subcutaneous q morning - 10a  . insulin detemir  7 Units Subcutaneous QHS  . levothyroxine  50 mcg Oral Q0600  . mouth rinse  15 mL Mouth Rinse BID  . methylPREDNISolone (SOLU-MEDROL) injection  40 mg Intravenous Q12H  . nitroGLYCERIN  0.5 inch Topical Q6H  . pantoprazole  40 mg Oral BID  . tacrolimus  3 mg Oral QHS  . tacrolimus  4 mg Oral Daily  . zinc sulfate  220 mg Oral Daily    Justin Mend, MD  09/06/2020, 2:23 PM

## 2020-09-07 ENCOUNTER — Inpatient Hospital Stay (HOSPITAL_COMMUNITY): Payer: HMO

## 2020-09-07 DIAGNOSIS — J9601 Acute respiratory failure with hypoxia: Secondary | ICD-10-CM | POA: Diagnosis not present

## 2020-09-07 DIAGNOSIS — U071 COVID-19: Secondary | ICD-10-CM | POA: Diagnosis not present

## 2020-09-07 LAB — COMPREHENSIVE METABOLIC PANEL
ALT: 161 U/L — ABNORMAL HIGH (ref 0–44)
AST: 56 U/L — ABNORMAL HIGH (ref 15–41)
Albumin: 3 g/dL — ABNORMAL LOW (ref 3.5–5.0)
Alkaline Phosphatase: 172 U/L — ABNORMAL HIGH (ref 38–126)
Anion gap: 13 (ref 5–15)
BUN: 49 mg/dL — ABNORMAL HIGH (ref 8–23)
CO2: 24 mmol/L (ref 22–32)
Calcium: 10 mg/dL (ref 8.9–10.3)
Chloride: 103 mmol/L (ref 98–111)
Creatinine, Ser: 2.48 mg/dL — ABNORMAL HIGH (ref 0.44–1.00)
GFR, Estimated: 21 mL/min — ABNORMAL LOW (ref >60)
Glucose, Bld: 182 mg/dL — ABNORMAL HIGH (ref 70–99)
Potassium: 4.7 mmol/L (ref 3.5–5.1)
Sodium: 140 mmol/L (ref 135–145)
Total Bilirubin: 1.2 mg/dL (ref 0.3–1.2)
Total Protein: 5.9 g/dL — ABNORMAL LOW (ref 6.5–8.1)

## 2020-09-07 LAB — CBC WITH DIFFERENTIAL/PLATELET
Abs Immature Granulocytes: 0.25 10*3/uL — ABNORMAL HIGH (ref 0.00–0.07)
Basophils Absolute: 0 10*3/uL (ref 0.0–0.1)
Basophils Relative: 0 %
Eosinophils Absolute: 0 10*3/uL (ref 0.0–0.5)
Eosinophils Relative: 0 %
HCT: 23.9 % — ABNORMAL LOW (ref 36.0–46.0)
Hemoglobin: 8.3 g/dL — ABNORMAL LOW (ref 12.0–15.0)
Immature Granulocytes: 2 %
Lymphocytes Relative: 2 %
Lymphs Abs: 0.3 10*3/uL — ABNORMAL LOW (ref 0.7–4.0)
MCH: 27.1 pg (ref 26.0–34.0)
MCHC: 34.7 g/dL (ref 30.0–36.0)
MCV: 78.1 fL — ABNORMAL LOW (ref 80.0–100.0)
Monocytes Absolute: 0.3 10*3/uL (ref 0.1–1.0)
Monocytes Relative: 2 %
Neutro Abs: 15 10*3/uL — ABNORMAL HIGH (ref 1.7–7.7)
Neutrophils Relative %: 94 %
Platelets: 119 10*3/uL — ABNORMAL LOW (ref 150–400)
RBC: 3.06 MIL/uL — ABNORMAL LOW (ref 3.87–5.11)
RDW: 18.7 % — ABNORMAL HIGH (ref 11.5–15.5)
WBC: 15.8 10*3/uL — ABNORMAL HIGH (ref 4.0–10.5)
nRBC: 0 % (ref 0.0–0.2)

## 2020-09-07 LAB — D-DIMER, QUANTITATIVE: D-Dimer, Quant: 0.32 ug/mL-FEU (ref 0.00–0.50)

## 2020-09-07 LAB — GLUCOSE, CAPILLARY
Glucose-Capillary: 180 mg/dL — ABNORMAL HIGH (ref 70–99)
Glucose-Capillary: 185 mg/dL — ABNORMAL HIGH (ref 70–99)
Glucose-Capillary: 243 mg/dL — ABNORMAL HIGH (ref 70–99)
Glucose-Capillary: 275 mg/dL — ABNORMAL HIGH (ref 70–99)

## 2020-09-07 LAB — BRAIN NATRIURETIC PEPTIDE: B Natriuretic Peptide: 191.7 pg/mL — ABNORMAL HIGH (ref 0.0–100.0)

## 2020-09-07 LAB — C-REACTIVE PROTEIN: CRP: 0.6 mg/dL (ref <1.0)

## 2020-09-07 LAB — MAGNESIUM: Magnesium: 2.3 mg/dL (ref 1.7–2.4)

## 2020-09-07 LAB — PROCALCITONIN: Procalcitonin: <0.1 ng/mL

## 2020-09-07 LAB — MRSA PCR SCREENING: MRSA by PCR: NEGATIVE

## 2020-09-07 MED ORDER — LORAZEPAM 2 MG/ML PO CONC
1.0000 mg | Freq: Two times a day (BID) | ORAL | Status: DC
  Administered 2020-09-07 – 2020-10-13 (×73): 1 mg via ORAL
  Filled 2020-09-07 (×12): qty 1
  Filled 2020-09-07: qty 0.5
  Filled 2020-09-07 (×60): qty 1

## 2020-09-07 NOTE — Progress Notes (Signed)
[facility] Progress Note   65 yo F with hx of DDRT x2 (latest 2016) on prograf, myfortic, and low-dose prednisone, T2DM, hypothyroidism, and recent diagnosis of COVID-19 presenting with acute hypoxic respiratory failure 2/2 COVID PNA. Also found to be in DKA with worsening AoCKD and hyperkalemia on labs.  Electrolyte disturbances improved.  Patient is slowly improving but creatinine remains consistently between 1.9 and 2.1.  Assessment:    1. AKI on CKD;  Cr presentation 4, nadir 1.9 1/17 to 1/24 then uptrending to 2.46 1/29 currently stable 2. H/O DDKT x 2 (latest 2016). Baseline Cr ?1.7 but probably around 1.5-2.Follows w/ Dr. Marval Regal ([facility]) & [facility] 3. Chronic immunosuppressionTac/MMF/Pred 4. Hypoxic respiratory faiolure secondary COVID-19 PNA; acutely worsened 1/28 and broad spectrum antibiotics added.  Continued hypoxia 5. DKA, h/o uncontrolled DM2w/ hyperglycemia, resolved 6. Hyperkalemia, resolved.  7. Anion Gap Metabolic acidosis, resolved/stable on NaHCO3 8. HTN  Plan: -Pilar Plate discussion today re: status and recommended d/c prograf; already mycophenolate on hold.  She understands there is a risk for rejection but in light of worsening pulmonary status it's necessary. - Hold diuretic dosing for today unless clinical change, doesn't appear pulmonary edema issue -Continue monitor creatinine daily -Now holding MMF and restart once patient has improved further  Dispo - tenuous; code status changed to DNR yesterday with plans for comfort care if worse.  Subjective:    Continues to require high levels of O2 and feels worse today.   813mL UOP documented    Objective:   BP (!) 141/73 (BP Location: Left Arm)   Pulse (!) 110   Temp (!) 97.5 F (36.4 C) (Axillary)   Resp (!) 30   Ht 5\' 2"  (1.575 m)   Wt 87.2 kg   SpO2 94%   BMI 35.16 kg/m   Intake/Output Summary (Last 24 hours) at 09/07/2020 1225 Last data filed at 09/07/2020 0600 Gross  per 24 hour  Intake 180 ml  Output 875 ml  Net -695 ml   Weight change:   Physical Exam: GEN: NAD, NCAT HEENT: No conjunctival pallor, EOMI NECK: Supple, no thyromegaly LUNGS: bilateral chest rise, iwob present, pursed lip breathing today, can't speak in full sentences; no rales CV: normal rate, no audible murmur; hyperdynamic ABD: SNDNT +BS  EXT: No lower extremity edema, warm and well perfused ACCESS: rt BCF aneurysmal pulsatile   Imaging: DG Chest Port 1 View  Result Date: 09/07/2020 CLINICAL DATA:  Post renal transplant x 2 most recently 2016, chronic immunosuppressive therapy, type II diabetes mellitus, presents with shortness of breath, acute hypoxic respiratory failure due to COVID-19 pneumonia EXAM: PORTABLE CHEST 1 VIEW COMPARISON:  Portable exam 1005 hours compared to 09/05/2020 FINDINGS: Enlargement of cardiac silhouette. Mediastinal contours and pulmonary vascularity normal. Atherosclerotic calcification aorta. BILATERAL scattered mixed airspace interstitial infiltrates consistent with multifocal pneumonia and COVID-19. No pleural effusion or pneumothorax. Bones demineralized with stent identified at the RIGHT axillary vessels. IMPRESSION: Persistent diffuse BILATERAL pulmonary infiltrates consistent with multifocal pneumonia and COVID-19. Electronically Signed   By: Lavonia Dana M.D.   On: 09/07/2020 10:28    Labs: BMET Recent Labs  Lab 09/01/20 0022 09/02/20 0202 09/03/20 0059 09/04/20 0131 09/05/20 0129 09/06/20 0428 09/07/20 0315  NA 140 140 141 140 140 139 140  K 4.8 5.2* 4.4 4.0 4.4 4.3 4.7  CL 108 107 106 105 107 103 103  CO2 22 23 25 26 22 23 24   GLUCOSE 222* 185* 143* 66* 98 146* 182*  BUN 59* 63*  61* 53* 48* 48* 49*  CREATININE 1.96* 1.96* 2.04* 2.11* 2.12* 2.46* 2.48*  CALCIUM 9.7 9.7 9.7 9.6 9.6 9.9 10.0  PHOS  --   --   --   --  3.4 5.5*  --    CBC Recent Labs  Lab 09/04/20 0131 09/05/20 0129 09/06/20 0428 09/07/20 0315  WBC 11.8* 12.0*  14.1* 15.8*  NEUTROABS  --   --   --  15.0*  HGB 7.6* 8.6* 8.3* 8.3*  HCT 23.1* 27.0* 25.5* 23.9*  MCV 77.5* 79.4* 78.9* 78.1*  PLT 101* 105* 120* 119*    Medications:    . amLODipine  10 mg Oral Daily  . vitamin C  500 mg Oral Daily  . aspirin EC  81 mg Oral Daily  . atorvastatin  40 mg Oral Daily  . enoxaparin (LOVENOX) injection  30 mg Subcutaneous Q24H  . feeding supplement (NEPRO CARB STEADY)  237 mL Oral BID BM  . fluticasone  1 spray Each Nare Daily  . hydrALAZINE  25 mg Oral Q8H  . insulin aspart  0-15 Units Subcutaneous TID WC  . insulin aspart  4 Units Subcutaneous TID WC  . insulin detemir  14 Units Subcutaneous q morning - 10a  . insulin detemir  7 Units Subcutaneous QHS  . levothyroxine  50 mcg Oral Q0600  . LORazepam  1 mg Oral BID  . mouth rinse  15 mL Mouth Rinse BID  . methylPREDNISolone (SOLU-MEDROL) injection  40 mg Intravenous Q12H  . nitroGLYCERIN  0.5 inch Topical Q6H  . pantoprazole  40 mg Oral BID  . tacrolimus  3 mg Oral QHS  . tacrolimus  4 mg Oral Daily  . zinc sulfate  220 mg Oral Daily    Justin Mend, MD  09/07/2020, 12:25 PM

## 2020-09-07 NOTE — Progress Notes (Signed)
PROGRESS NOTE                                                                                                                                                                                                             Patient Demographics:    [name] [name], is a 65 y.o. female, DOB - [birth year], NOB:[id number]  Outpatient Primary MD for the patient is Glendale Chard, MD   Admit date - 08/18/2020   LOS - 69  Chief Complaint  Patient presents with  . Covid Positive  . Chest Pain  . Weakness       Brief Narrative: Patient is a 65 y.o. female with PMHx of renal transplant x2 (latest in 2016) on chronic immunosuppressive agents, DM-2, hypothyroidism-who presented with shortness of breath-found to have acute hypoxic respiratory failure due to COVID-19 pneumonia.  Hospital course complicated by slow improvement from severe hypoxemia, AKI and non-STEMI.  COVID-19 vaccinated status: Vaccinated including booster  Significant Events: 1/10>> Admit to [facility] for acute hypoxic respiratory failure due to COVID-19 pneumonia 1/11>> transfer to ICU due to worsening hypoxemia, AKI/metabolic acidosis/hyperkalemia. 1/14>> transferred to [facility] 1/23>> chest pain-non-STEMI-cardiology consult 1/25>> chest pain-resolved by SL nitroglycerin x1 1/28>> tachypneic- worsening hypoxemia-now on 15 L via NRB and 15 L via HFNC  Significant studies: 1/10>> chest x-ray: Severe multilobar bilateral pneumonia. 1/11>> ultrasound renal transplant: No hydronephrosis 1/16>> CT chest: Multifocal viral pneumonia 1/26>>Echo: EF> 70%, LVH, grade 1 diastolic dysfunction 9/47>> chest x-ray: Progression of diffuse bilateral pulmonary opacitie   COVID-19 medications: Steroids: 1/10>> Remdesivir: 1/10>>1/15 Baricitinib: 1/16>>  Antibiotics: Rocephin: 1/12>> 1/18 Cefepime: 1/28>> Vancomycin: 1/28>>  Microbiology data: 1/10 >>blood culture: No  growth 1/11>> urine culture: Klebsiella pneumoniae  Procedures: None  Consults: PCCM, nephrology, cardiology  DVT prophylaxis: enoxaparin (LOVENOX) injection 30 mg Start: 09/03/20 1400 SCDs Start: 08/19/20 0933    Subjective:   Patient in bed, appears comfortable, denies any headache, no fever, no chest pain or pressure, ++ shortness of breath , no abdominal pain. No focal weakness.   Assessment  & Plan :   Acute Hypoxic Resp Failure due to Covid 19 Viral pneumonia: she has been fully vaccinated however she was immunocompromised due to her underlying history of renal transplant and being on immunosuppressive medications.  She developed severe parenchymal lung injury due to COVID-19 pneumonia/ARDS, has been treated with IV steroids,  Baricitinib and Remdesivir combination along with as needed Lasix.  She has also been given a trial of IV antibiotics for any superimposed bacterial component to her pneumonia  Despite aggressive measures she remains severely hypoxic, detailed discussion between treatment team and family has been done by previous MD and me.  Goal of care is gentle medical treatment to continue, if significant decline or worsening focus on comfort measures.   Although she has leukocytosis I think this is largely due to steroids, her procalcitonin has been undetectable, I will at this time stop antibiotics on 09/07/2020, no productive cough or focal infiltrate on x-ray.  Will continue Solu-Medrol, she has finished Remdesivir and Baricitinib.  Heated high flow and supportive care will continue.  If any clinical worsening or discomfort full comfort care will be initiated, I have discussed this with daughter personally on 09/06/2020.   Prone/Incentive Spirometry: encouraged  incentive spirometry use 3-4/hour.  O2 requirements:   SpO2: 94 % O2 Flow Rate (L/min): 25 L/min FiO2 (%): 90 %     Recent Labs  Lab 08/31/20 2212 09/01/20 0022 09/02/20 0202 09/03/20 0059  09/04/20 0131 09/05/20 0129 09/05/20 0821 09/05/20 1110 09/06/20 0428 09/07/20 0315  WBC  --    < > 19.3* 14.0* 11.8* 12.0*  --   --  14.1* 15.8*  HGB  --    < > 9.8* 7.9* 7.6* 8.6*  --   --  8.3* 8.3*  HCT  --    < > 29.7* 24.6* 23.1* 27.0*  --   --  25.5* 23.9*  PLT  --    < > 115* 103* 101* 105*  --   --  120* 119*  CRP  --    < > 0.9 0.7 0.6  --  0.6  --  <0.5 0.6  BNP 235.3*  --   --   --   --  107.0*  --   --   --  191.7*  DDIMER  --    < > 0.52* 0.35 <0.27 0.40  --   --  0.43 0.32  PROCALCITON  --   --   --   --   --   --   --  <0.10  --   --   AST  --    < > 97* 111* 56*  --   --   --  50* 56*  ALT  --    < > 249* 285* 220*  --   --   --  176* 161*  ALKPHOS  --    < > 102 117 111  --   --   --  156* 172*  BILITOT  --    < > 0.6 0.7 1.0  --   --   --  1.3* 1.2  ALBUMIN  --    < > 2.8* 2.6* 2.7* 3.0*  --   --  3.1*  3.1* 3.0*   < > = values in this interval not displayed.     Sinus pause: Occurred on 1/20-approximately 4.25 seconds-this occurred while she was sleeping-after discussion with cardiology-beta-blocker discontinued.  Needs outpatient polysomnography.  Non-STEMI: Developed severe chest pain on 1/23 with EKG changes-relieved with nitroglycerin and morphine infusion-chest pain reoccurred on 1/25.  Echo is reassuring with preserved EF.  On aspirin/statin-has completed the 48 hours of IV heparin.  Cardiology has signed off.  AKI on CKD stage IV-transplanted kidney (second renal transplant-01/2015): AKI hemodynamically mediated-creatinine has overall improved-and plateaued to around 1.9 range-but lately  has started to trend up. Nephrology reconsulted-Myfortic remains on hold-on Prograf and steroids-Prograf levels pending. Discussed earlier this morning with Dr. Dan Humphreys to try IV diuretics given tenuous respiratory status to see if this improves her tachypnea/hypoxia.  Hyperkalemia/anion gap metabolic acidosis: Secondary to AKI-has resolved.  Transaminitis: LFTs  gradually getting better-continue to monitor closely-watch closely as on statins.    Anemia: Due to critical illness and CKD-watch closely-no evidence of blood loss-had brown-colored stools the day before per patient. Transfuse if less than 7. Yesterday per patient.    Thrombocytopenia: Mild-stable-gradually improving-watch closely.   Complicated UTI: Completed a course of Rocephin.  Afebrile and nontoxic-appearing.  DKA: Resolved-no longer on insulin drip.  DM-2 (A1c 8.2 on 1/11) with uncontrolled hyperglycemia due to steroids: Hypoglycemic episode reoccurred this morning-Levemir dosage has been adjusted for the past few days-steroid dosage significantly escalated today-suspect at risk for hypoglycemia. Continue Levemir at current doses-watch closely-if hypoglycemia reoccurs-may need to adjust further.  Recent Labs    09/06/20 1720 09/06/20 2009 09/07/20 0742  GLUCAP 182* 215* 180*    Hypothyroidism: Continue levothyroxine-given suppressed TSH-I have decreased dosage to 50 mcg-repeat TSH in 3 months.  HTN: BP stable-continue manidipine and hydralazine.   HLD: Continue statin.  Obesity: Estimated body mass index is 35.16 kg/m as calculated from the following:   Height as of this encounter: 5\' 2"  (1.575 m).   Weight as of this encounter: 87.2 kg.   RN pressure injury documentation: Pressure Injury 08/20/20 Coccyx Mid Stage 1 -  Intact skin with non-blanchable redness of a localized area usually over a bony prominence. (Active)  08/20/20 0800  Location: Coccyx  Location Orientation: Mid  Staging: Stage 1 -  Intact skin with non-blanchable redness of a localized area usually over a bony prominence.  Wound Description (Comments):   Present on Admission: Yes    GI prophylaxis: PPI  Condition - Extremely Guarded  Family Communication  :  Daughter Mendel Ryder [phone] updated on 09/04/20, 09/06/20  Code Status :  Full Code  Diet :  Diet Order            Diet Carb Modified  Fluid consistency: Thin; Room service appropriate? Yes  Diet effective now                  Disposition Plan  :   Status is: Inpatient  Remains inpatient appropriate because:Inpatient level of care appropriate due to severity of illness   Dispo: The patient is from: Home              Anticipated d/c is to: SNF              Anticipated d/c date is: > 3 days              Patient currently is not medically stable to d/c.   Barriers to discharge: Worsening hypoxia requiring O2 supplementation  Antimicorbials  :    Anti-infectives (From admission, onward)   Start     Dose/Rate Route Frequency Ordered Stop   09/07/20 1030  vancomycin (VANCOCIN) IVPB 1000 mg/200 mL premix        1,000 mg 200 mL/hr over 60 Minutes Intravenous Every 48 hours 09/05/20 0933     09/05/20 1030  vancomycin (VANCOREADY) IVPB 2000 mg/400 mL        2,000 mg 200 mL/hr over 120 Minutes Intravenous  Once 09/05/20 0933 09/05/20 2018   09/05/20 1030  ceFEPIme (MAXIPIME) 2 g in sodium chloride 0.9 % 100 mL IVPB  2 g 200 mL/hr over 30 Minutes Intravenous Every 24 hours 09/05/20 0933     08/20/20 1445  cefTRIAXone (ROCEPHIN) 1 g in sodium chloride 0.9 % 100 mL IVPB        1 g 200 mL/hr over 30 Minutes Intravenous Every 24 hours 08/20/20 1348 08/26/20 2147   08/20/20 1000  remdesivir 100 mg in sodium chloride 0.9 % 100 mL IVPB       "Followed by" Linked Group Details   100 mg 200 mL/hr over 30 Minutes Intravenous Daily 08/19/20 0021 08/23/20 1113   08/19/20 0130  remdesivir 200 mg in sodium chloride 0.9% 250 mL IVPB       "Followed by" Linked Group Details   200 mg 580 mL/hr over 30 Minutes Intravenous Once 08/19/20 0021 08/19/20 0314      Inpatient Medications  Scheduled Meds: . amLODipine  10 mg Oral Daily  . vitamin C  500 mg Oral Daily  . aspirin EC  81 mg Oral Daily  . atorvastatin  40 mg Oral Daily  . enoxaparin (LOVENOX) injection  30 mg Subcutaneous Q24H  . feeding supplement (NEPRO CARB  STEADY)  237 mL Oral BID BM  . fluticasone  1 spray Each Nare Daily  . hydrALAZINE  25 mg Oral Q8H  . insulin aspart  0-15 Units Subcutaneous TID WC  . insulin aspart  4 Units Subcutaneous TID WC  . insulin detemir  14 Units Subcutaneous q morning - 10a  . insulin detemir  7 Units Subcutaneous QHS  . levothyroxine  50 mcg Oral Q0600  . LORazepam  1 mg Oral BID  . mouth rinse  15 mL Mouth Rinse BID  . methylPREDNISolone (SOLU-MEDROL) injection  40 mg Intravenous Q12H  . nitroGLYCERIN  0.5 inch Topical Q6H  . pantoprazole  40 mg Oral BID  . tacrolimus  3 mg Oral QHS  . tacrolimus  4 mg Oral Daily  . zinc sulfate  220 mg Oral Daily   Continuous Infusions: . sodium chloride    . ceFEPime (MAXIPIME) IV 2 g (09/06/20 1115)  . sodium chloride Stopped (08/31/20 1545)  . vancomycin     PRN Meds:.sodium chloride, acetaminophen **OR** acetaminophen, albuterol, guaiFENesin, hydrALAZINE, loperamide, morphine injection, nitroGLYCERIN, ondansetron (ZOFRAN) IV, polyethylene glycol, sodium chloride   Time Spent in minutes  45   The patient is critically ill with multiple organ system failure and requires high complexity decision making for assessment and support, frequent evaluation and titration of therapies, advanced monitoring, review of radiographic studies and interpretation of complex data.    See all Orders from today for further details   Lala Lund M.D on 09/07/2020 at 9:46 AM  To page go to [email or web address] - use universal password  [facility] -  Office  [phone]    Objective:   Vitals:   09/07/20 0418 09/07/20 0630 09/07/20 0740 09/07/20 0900  BP: 123/64 139/70 (!) 141/73   Pulse: (!) 103 (!) 110 (!) 108 (!) 110  Resp: 20 20 (!) 23 (!) 30  Temp: (!) 97.5 F (36.4 C) 97.7 F (36.5 C) (!) 97.5 F (36.4 C)   TempSrc: Oral Oral Axillary   SpO2: 100% 100% 99% 94%  Weight:      Height:        Wt Readings from Last 3 Encounters:  09/07/20 87.2 kg  05/14/20  92.5 kg  05/14/20 92.9 kg     Intake/Output Summary (Last 24 hours) at 09/07/2020 0946 Last data filed at 09/07/2020 0600  Gross per 24 hour  Intake 280 ml  Output 875 ml  Net -595 ml     Physical Exam  Awake Alert, No new F.N deficits, Normal affect [place].AT,PERRAL Supple Neck,No JVD, No cervical lymphadenopathy appriciated.  Symmetrical Chest wall movement, Good air movement bilaterally, CTAB RRR,No Gallops, Rubs or new Murmurs, No Parasternal Heave +ve B.Sounds, Abd Soft, No tenderness, No organomegaly appriciated, No rebound - guarding or rigidity. No Cyanosis, Clubbing or edema, No new Rash or bruise    Data Review:    CBC Recent Labs  Lab 09/03/20 0059 09/04/20 0131 09/05/20 0129 09/06/20 0428 09/07/20 0315  WBC 14.0* 11.8* 12.0* 14.1* 15.8*  HGB 7.9* 7.6* 8.6* 8.3* 8.3*  HCT 24.6* 23.1* 27.0* 25.5* 23.9*  PLT 103* 101* 105* 120* 119*  MCV 78.3* 77.5* 79.4* 78.9* 78.1*  MCH 25.2* 25.5* 25.3* 25.7* 27.1  MCHC 32.1 32.9 31.9 32.5 34.7  RDW 16.3* 16.4* 17.4* 18.1* 18.7*  LYMPHSABS  --   --   --   --  0.3*  MONOABS  --   --   --   --  0.3  EOSABS  --   --   --   --  0.0  BASOSABS  --   --   --   --  0.0    Chemistries  Recent Labs  Lab 09/02/20 0202 09/03/20 0059 09/04/20 0131 09/05/20 0129 09/06/20 0428 09/07/20 0315  NA 140 141 140 140 139 140  K 5.2* 4.4 4.0 4.4 4.3 4.7  CL 107 106 105 107 103 103  CO2 23 25 26 22 23 24   GLUCOSE 185* 143* 66* 98 146* 182*  BUN 63* 61* 53* 48* 48* 49*  CREATININE 1.96* 2.04* 2.11* 2.12* 2.46* 2.48*  CALCIUM 9.7 9.7 9.6 9.6 9.9 10.0  MG  --   --   --   --   --  2.3  AST 97* 111* 56*  --  50* 56*  ALT 249* 285* 220*  --  176* 161*  ALKPHOS 102 117 111  --  156* 172*  BILITOT 0.6 0.7 1.0  --  1.3* 1.2   ------------------------------------------------------------------------------------------------------------------ No results for input(s): CHOL, HDL, LDLCALC, TRIG, CHOLHDL, LDLDIRECT in the last 72 hours.  Lab  Results  Component Value Date   HGBA1C 8.2 (H) 08/19/2020   ------------------------------------------------------------------------------------------------------------------ No results for input(s): TSH, T4TOTAL, T3FREE, THYROIDAB in the last 72 hours.  Invalid input(s): FREET3 ------------------------------------------------------------------------------------------------------------------ No results for input(s): VITAMINB12, FOLATE, FERRITIN, TIBC, IRON, RETICCTPCT in the last 72 hours.  Coagulation profile No results for input(s): INR, PROTIME in the last 168 hours.  Recent Labs    09/06/20 0428 09/07/20 0315  DDIMER 0.43 0.32    Cardiac Enzymes No results for input(s): CKMB, TROPONINI, MYOGLOBIN in the last 168 hours.  Invalid input(s): CK ------------------------------------------------------------------------------------------------------------------    Component Value Date/Time   BNP 191.7 (H) 09/07/2020 0315    Micro Results No results found for this or any previous visit (from the past 240 hour(s)).  Radiology Reports CT CHEST WO CONTRAST  Result Date: 08/24/2020 CLINICAL DATA:  Severe hypoxia and respiratory failure with COVID-19 infection. EXAM: CT CHEST WITHOUT CONTRAST TECHNIQUE: Multidetector CT imaging of the chest was performed following the standard protocol without IV contrast. COMPARISON:  Chest x-ray 08/18/2020 FINDINGS: Cardiovascular: Mild cardiomegaly. Mild calcification over the mitral valve annulus. Thoracic aorta is normal caliber. Stent over the right subclavian/axillary region. Remaining vascular structures are unremarkable. Mediastinum/Nodes: No evidence of mediastinal or hilar adenopathy. Remaining mediastinal  structures are unremarkable. Lungs/Pleura: Lungs are adequately inflated demonstrate moderate bilateral hazy airspace process likely multifocal viral pneumonia in this COVID-19 positive patient. No evidence of effusion. Airways are otherwise  unremarkable. Upper Abdomen: No acute findings. Musculoskeletal: Degenerative change of the spine. Mild anterior wedging of a midthoracic vertebral body likely chronic. IMPRESSION: 1. Moderate bilateral hazy airspace process likely multifocal viral pneumonia in this COVID-19 positive patient. 2. Mild cardiomegaly. 3. Mild anterior wedging of a midthoracic vertebral body likely chronic. Electronically Signed   By: Marin Olp M.D.   On: 08/24/2020 12:37   US Renal Transplant w/Doppler  Result Date: 08/19/2020 CLINICAL DATA:  Acute kidney injury EXAM: ULTRASOUND OF RENAL TRANSPLANT WITH RENAL DOPPLER ULTRASOUND TECHNIQUE: Ultrasound examination of the renal transplant was performed with gray-scale, color and duplex doppler evaluation. COMPARISON:  None. FINDINGS: Transplant kidney location: Left lower quadrant Transplant Kidney: Renal measurements: 10.8 x 6.2 x 6.5 cm = volume: 266mL. Normal in size and parenchymal echogenicity. No evidence of mass or hydronephrosis. No peri-transplant fluid collection seen. Color flow in the main renal artery:  Yes Color flow in the main renal vein:  Yes Duplex Doppler Evaluation: Main Renal Artery Velocity:  cm/sec Main Renal Artery Resistive Index: 0.99 Venous waveform in main renal vein:  Present Intrarenal resistive index in upper pole:  0.98 (normal 0.6-0.8; equivocal 0.8-0.9; abnormal >= 0.9) Intrarenal resistive index in lower pole: 0.98 (normal 0.6-0.8; equivocal 0.8-0.9; abnormal >= 0.9) Bladder: Normal for degree of bladder distention. Other findings:  None. IMPRESSION: Elevated resistive indices within the left lower quadrant transplant kidney. No hydronephrosis. Electronically Signed   By: Rolm Baptise M.D.   On: 08/19/2020 19:15   DG CHEST PORT 1 VIEW  Result Date: 08/31/2020 CLINICAL DATA:  65 year old female with shortness of breath. EXAM: PORTABLE CHEST 1 VIEW COMPARISON:  Chest radiograph dated 08/18/2020. FINDINGS: Bilateral confluent and reticular  densities, progressed since the prior radiograph and most consistent with multifocal pneumonia. No pleural effusion pneumothorax. Borderline cardiomegaly. No acute osseous pathology. IMPRESSION: Interval progression of bilateral pulmonary opacities compared to the radiograph of 08/18/2020. Continued follow-up recommended. Electronically Signed   By: Anner Crete M.D.   On: 08/31/2020 21:17   DG Chest Portable 1 View  Result Date: 08/18/2020 CLINICAL DATA:  65 year old female with history of COVID infection. Chest pain. Shortness of breath. EXAM: PORTABLE CHEST 1 VIEW COMPARISON:  Chest x-ray 10/20/2014. FINDINGS: Patchy multifocal airspace consolidation and areas of interstitial prominence are noted throughout the lungs bilaterally, most severe throughout the periphery of the mid to lower left lung, indicative of multilobar bilateral pneumonia. No definite pleural effusions. No evidence of pulmonary edema. No pneumothorax. Heart size is borderline enlarged. Upper mediastinal contours are within normal limits. Atherosclerotic calcifications in the thoracic aorta. Vascular stent projecting over the right subclavian region. IMPRESSION: 1. Severe multilobar bilateral (left greater than right) pneumonia compatible with reported COVID infection. 2. Aortic atherosclerosis. Electronically Signed   By: Vinnie Langton M.D.   On: 08/18/2020 18:02   DG Chest Port 1V same Day  Result Date: 09/05/2020 CLINICAL DATA:  Shortness of breath, COVID positive EXAM: PORTABLE CHEST 1 VIEW COMPARISON:  08/31/2020 FINDINGS: Persistent diffuse bilateral pulmonary opacities. Aeration has decreased since the prior study. No significant pleural effusion. No pneumothorax. Stable cardiomediastinal contours. IMPRESSION: Progression of diffuse bilateral pulmonary opacities. Electronically Signed   By: Macy Mis M.D.   On: 09/05/2020 08:05   ECHOCARDIOGRAM LIMITED  Result Date: 09/02/2020    ECHOCARDIOGRAM LIMITED REPORT  Patient Name:   [facility] Date of Exam: 09/02/2020 Medical Rec #:  [id number]               Height:       62.0 in Accession #:    [id number]              Weight:       194.4 lb Date of Birth:  08-Oct-1955              BSA:          1.889 m Patient Age:    55 years                BP:           135/66 mmHg Patient Gender: F                       HR:           100 bpm. Exam Location:  Inpatient Procedure: Limited Echo, Limited Color Doppler and Cardiac Doppler Indications:    chest pain  History:        Patient has prior history of Echocardiogram examinations, most                 recent 09/30/2006. Covid; Risk Factors:Diabetes and Hypertension.  Sonographer:    Johny Chess Referring Phys: [place]  1. Left ventricular ejection fraction, by estimation, is >75%. The left ventricle has hyperdynamic function. The left ventricle has no regional wall motion abnormalities. There is moderate left ventricular hypertrophy. Left ventricular diastolic parameters are consistent with Grade I diastolic dysfunction (impaired relaxation).  2. Right ventricular systolic function is normal. The right ventricular size is normal.  3. The mitral valve is normal in structure. No evidence of mitral valve regurgitation. No evidence of mitral stenosis. Moderate mitral annular calcification.  4. The aortic valve is tricuspid. Aortic valve regurgitation is not visualized. Mild aortic valve sclerosis is present, with no evidence of aortic valve stenosis.  5. The inferior vena cava is normal in size with greater than 50% respiratory variability, suggesting right atrial pressure of 3 mmHg. FINDINGS  Left Ventricle: Left ventricular ejection fraction, by estimation, is >75%. The left ventricle has hyperdynamic function. The left ventricle has no regional wall motion abnormalities. The left ventricular internal cavity size was normal in size. There is moderate left ventricular hypertrophy. Left ventricular  diastolic parameters are consistent with Grade I diastolic dysfunction (impaired relaxation). Right Ventricle: The right ventricular size is normal. Right ventricular systolic function is normal. Left Atrium: Left atrial size was normal in size. Right Atrium: Right atrial size was normal in size. Pericardium: There is no evidence of pericardial effusion. Mitral Valve: The mitral valve is normal in structure. Moderate mitral annular calcification. No evidence of mitral valve stenosis. Tricuspid Valve: The tricuspid valve is normal in structure. Tricuspid valve regurgitation is trivial. No evidence of tricuspid stenosis. Aortic Valve: The aortic valve is tricuspid. Aortic valve regurgitation is not visualized. Mild aortic valve sclerosis is present, with no evidence of aortic valve stenosis. Pulmonic Valve: The pulmonic valve was normal in structure. Pulmonic valve regurgitation is not visualized. No evidence of pulmonic stenosis. Aorta: The aortic root is normal in size and structure. Venous: The inferior vena cava is normal in size with greater than 50% respiratory variability, suggesting right atrial pressure of 3 mmHg. IAS/Shunts: No atrial level shunt detected by color flow Doppler. LEFT VENTRICLE PLAX  2D LVIDd:         3.70 cm  Diastology LVIDs:         2.50 cm  LV e' medial:    5.33 cm/s LV PW:         1.00 cm  LV E/e' medial:  13.8 LV IVS:        0.90 cm  LV e' lateral:   5.66 cm/s LVOT diam:     1.70 cm  LV E/e' lateral: 13.0 LV SV:         59 LV SV Index:   31 LVOT Area:     2.27 cm  IVC IVC diam: 1.30 cm LEFT ATRIUM         Index LA diam:    3.90 cm 2.06 cm/m  AORTIC VALVE LVOT Vmax:   170.00 cm/s LVOT Vmean:  114.000 cm/s LVOT VTI:    0.258 m  AORTA Ao Asc diam: 2.60 cm MV E velocity: 73.50 cm/s MV A velocity: 121.00 cm/s  SHUNTS MV E/A ratio:  0.61         Systemic VTI:  0.26 m                             Systemic Diam: 1.70 cm Kirk Ruths MD Electronically signed by Kirk Ruths MD Signature  Date/Time: 09/02/2020/12:26:37 PM    Final

## 2020-09-07 NOTE — Progress Notes (Signed)
Pt vitals as follows. Pt tachnypneic and low tachycardia. Yellow MEWS protocol implemented    09/07/20 0331  Assess: MEWS Score  Temp 97.6 F (36.4 C)  BP 117/82  Pulse Rate (!) 106  Resp (!) 22  Level of Consciousness Alert  SpO2 100 %  O2 Device HFNC;Non-rebreather Mask  O2 Flow Rate (L/min) 15 L/min  Assess: MEWS Score  MEWS Temp 0  MEWS Systolic 0  MEWS Pulse 1  MEWS RR 1  MEWS LOC 0  MEWS Score 2  MEWS Score Color Yellow  Assess: if the MEWS score is Yellow or Red  Were vital signs taken at a resting state? Yes  Focused Assessment No change from prior assessment  Early Detection of Sepsis Score *See Row Information* High  MEWS guidelines implemented *See Row Information* Yes  Treat  MEWS Interventions Administered scheduled meds/treatments  Take Vital Signs  Increase Vital Sign Frequency  Yellow: Q 2hr X 2 then Q 4hr X 2, if remains yellow, continue Q 4hrs  Escalate  MEWS: Escalate Yellow: discuss with charge nurse/RN and consider discussing with provider and RRT  Notify: Charge Nurse/RN  Name of Charge Nurse/RN Notified [place]  Date Charge Nurse/RN Notified 09/07/20  Time Charge Nurse/RN Notified 0340

## 2020-09-07 NOTE — Progress Notes (Signed)
Yellow MEWS @0740 . Patient was previously yellow, so no interventions necessary at this time. Notified charge nurse. Will continue to monitor patient.

## 2020-09-08 DIAGNOSIS — J9601 Acute respiratory failure with hypoxia: Secondary | ICD-10-CM | POA: Diagnosis not present

## 2020-09-08 DIAGNOSIS — U071 COVID-19: Secondary | ICD-10-CM | POA: Diagnosis not present

## 2020-09-08 LAB — IRON AND TIBC
Iron: 96 ug/dL (ref 28–170)
Saturation Ratios: 32 % — ABNORMAL HIGH (ref 10.4–31.8)
TIBC: 295 ug/dL (ref 250–450)
UIBC: 199 ug/dL

## 2020-09-08 LAB — COMPREHENSIVE METABOLIC PANEL
ALT: 151 U/L — ABNORMAL HIGH (ref 0–44)
AST: 65 U/L — ABNORMAL HIGH (ref 15–41)
Albumin: 2.9 g/dL — ABNORMAL LOW (ref 3.5–5.0)
Alkaline Phosphatase: 185 U/L — ABNORMAL HIGH (ref 38–126)
Anion gap: 10 (ref 5–15)
BUN: 55 mg/dL — ABNORMAL HIGH (ref 8–23)
CO2: 23 mmol/L (ref 22–32)
Calcium: 9.9 mg/dL (ref 8.9–10.3)
Chloride: 102 mmol/L (ref 98–111)
Creatinine, Ser: 2.4 mg/dL — ABNORMAL HIGH (ref 0.44–1.00)
GFR, Estimated: 22 mL/min — ABNORMAL LOW (ref >60)
Glucose, Bld: 250 mg/dL — ABNORMAL HIGH (ref 70–99)
Potassium: 5.3 mmol/L — ABNORMAL HIGH (ref 3.5–5.1)
Sodium: 135 mmol/L (ref 135–145)
Total Bilirubin: 1.1 mg/dL (ref 0.3–1.2)
Total Protein: 5.6 g/dL — ABNORMAL LOW (ref 6.5–8.1)

## 2020-09-08 LAB — CBC WITH DIFFERENTIAL/PLATELET
Abs Immature Granulocytes: 0.25 10*3/uL — ABNORMAL HIGH (ref 0.00–0.07)
Basophils Absolute: 0 10*3/uL (ref 0.0–0.1)
Basophils Relative: 0 %
Eosinophils Absolute: 0 10*3/uL (ref 0.0–0.5)
Eosinophils Relative: 0 %
HCT: 21.8 % — ABNORMAL LOW (ref 36.0–46.0)
Hemoglobin: 7.4 g/dL — ABNORMAL LOW (ref 12.0–15.0)
Immature Granulocytes: 2 %
Lymphocytes Relative: 1 %
Lymphs Abs: 0.2 10*3/uL — ABNORMAL LOW (ref 0.7–4.0)
MCH: 26.5 pg (ref 26.0–34.0)
MCHC: 33.9 g/dL (ref 30.0–36.0)
MCV: 78.1 fL — ABNORMAL LOW (ref 80.0–100.0)
Monocytes Absolute: 0.2 10*3/uL (ref 0.1–1.0)
Monocytes Relative: 2 %
Neutro Abs: 13.5 10*3/uL — ABNORMAL HIGH (ref 1.7–7.7)
Neutrophils Relative %: 95 %
Platelets: 101 10*3/uL — ABNORMAL LOW (ref 150–400)
RBC: 2.79 MIL/uL — ABNORMAL LOW (ref 3.87–5.11)
RDW: 19 % — ABNORMAL HIGH (ref 11.5–15.5)
WBC: 14.1 10*3/uL — ABNORMAL HIGH (ref 4.0–10.5)
nRBC: 0 % (ref 0.0–0.2)

## 2020-09-08 LAB — RETICULOCYTES
Immature Retic Fract: 19.6 % — ABNORMAL HIGH (ref 2.3–15.9)
RBC.: 3.33 MIL/uL — ABNORMAL LOW (ref 3.87–5.11)
Retic Count, Absolute: 107.9 10*3/uL (ref 19.0–186.0)
Retic Ct Pct: 3.2 % — ABNORMAL HIGH (ref 0.4–3.1)

## 2020-09-08 LAB — D-DIMER, QUANTITATIVE: D-Dimer, Quant: 0.3 ug/mL-FEU (ref 0.00–0.50)

## 2020-09-08 LAB — GLUCOSE, CAPILLARY
Glucose-Capillary: 224 mg/dL — ABNORMAL HIGH (ref 70–99)
Glucose-Capillary: 217 mg/dL — ABNORMAL HIGH (ref 70–99)
Glucose-Capillary: 305 mg/dL — ABNORMAL HIGH (ref 70–99)
Glucose-Capillary: 146 mg/dL — ABNORMAL HIGH (ref 70–99)

## 2020-09-08 LAB — PROCALCITONIN: Procalcitonin: <0.1 ng/mL

## 2020-09-08 LAB — MAGNESIUM: Magnesium: 2.3 mg/dL (ref 1.7–2.4)

## 2020-09-08 LAB — BRAIN NATRIURETIC PEPTIDE: B Natriuretic Peptide: 136.7 pg/mL — ABNORMAL HIGH (ref 0.0–100.0)

## 2020-09-08 LAB — C-REACTIVE PROTEIN: CRP: 0.7 mg/dL (ref <1.0)

## 2020-09-08 LAB — VITAMIN B12: Vitamin B-12: 686 pg/mL (ref 180–914)

## 2020-09-08 LAB — FERRITIN: Ferritin: 3647 ng/mL — ABNORMAL HIGH (ref 11–307)

## 2020-09-08 LAB — FOLATE: Folate: 5.6 ng/mL — ABNORMAL LOW (ref >5.9)

## 2020-09-08 MED ORDER — METHYLPREDNISOLONE SODIUM SUCC 40 MG IJ SOLR
40.0000 mg | Freq: Every day | INTRAMUSCULAR | Status: DC
  Administered 2020-09-09 – 2020-09-11 (×3): 40 mg via INTRAVENOUS
  Filled 2020-09-08 (×3): qty 1

## 2020-09-08 MED ORDER — FUROSEMIDE 40 MG PO TABS
40.0000 mg | ORAL_TABLET | Freq: Once | ORAL | Status: AC
  Administered 2020-09-08: 40 mg via ORAL
  Filled 2020-09-08: qty 1

## 2020-09-08 MED ORDER — SODIUM POLYSTYRENE SULFONATE 15 GM/60ML PO SUSP
30.0000 g | Freq: Once | ORAL | Status: AC
  Administered 2020-09-08: 30 g via ORAL
  Filled 2020-09-08: qty 120

## 2020-09-08 NOTE — Progress Notes (Signed)
Physical Therapy Treatment Patient Details Name: [name] [name] MRN: [id number] DOB: 1955-12-15 Today's Date: 09/08/2020    History of Present Illness Pt is a 65 y.o. female who tested (+) COVID-19 on 08/13/20, now admitted 08/18/20 with worsening SOB, cough, chest pain and body aches. Workup for acute hypoxic respiratory failure due to COVID-19 PNA, DKA, AKI on CKD, UTI. PMH includes HTN, DM2, chronic steroid use, s/p renal transplant x2.Episode of CP 1/22; per Cardiology: troponin, EKG and history felt concerning for [place] / possibly NSTEMI - difficult to know in context of multiple medical stressors    PT Comments    Pt continues to be limited in mobility by increased O2 demand, and associated anxiety that movement will cause increased work of breathing. Pt educated on importance of deep breathing instead of rapid breathing pattern she has when asked to move. Pt verbalizes understanding working on visualization techniques with sitting on edge of recliner. Unable to achieve RR below 35 bpm. Pt sat back in chair and continued working on visualization ultimately able to maintain RR at 25 bpm while performing ankle pumps. Pt obviously fatigued by end of session. D/c plans remain appropriate. PT will continue to work to progress mobility.     Follow Up Recommendations  SNF     Equipment Recommendations  Rolling walker with 5" wheels;3in1 (PT)       Precautions / Restrictions Precautions Precaution Comments: Watch SpO2 - quick to desaturate on HHFNC    Mobility  Bed Mobility               General bed mobility comments: OOB in recliner on entry                 Balance Overall balance assessment: Needs assistance Sitting-balance support: Feet supported;No upper extremity supported;Single extremity supported;Bilateral upper extremity supported Sitting balance-Leahy Scale: Fair Sitting balance - Comments: able to sit at the edge of the recliner with increased effort and  4/4 DoE                                    Cognition Arousal/Alertness: Awake/alert Behavior During Therapy: Anxious;Flat affect Overall Cognitive Status: Within Functional Limits for tasks assessed                                 General Comments: pt very flat and anxious about any mobility, HR increasing to 125 bpm with the mention of exercise      Exercises Other Exercises Other Exercises: deep breathing exercise, able to reduce RR from 55 to 25 bpm, worked on keeping RR with simple movement of ankles,pt to practice Other Exercises: seated balance edge of chair 10 min    General Comments General comments (skin integrity, edema, etc.): Pt continues to require increased supplemental O2, 30L O2 via HHFNC, FiO2 60% with 15L O2 via NRB. Pt very anxious with mention of mobilization, send HR to high 120s, RR 55      Pertinent Vitals/Pain Pain Location: chest and shoulders with sitting up in bed Pain Descriptors / Indicators: Discomfort           PT Goals (current goals can now be found in the care plan section) Acute Rehab PT Goals Patient Stated Goal: Hopeful for return home, but willing to consider post-acute rehab at SNF PT Goal Formulation: With patient Time For Goal Achievement:  09/05/20 Potential to Achieve Goals: Good Progress towards PT goals: Not progressing toward goals - comment (increased amxiety with movement due to increased WoB)    Frequency    Min 2X/week      PT Plan Current plan remains appropriate       AM-PAC PT "6 Clicks" Mobility   Outcome Measure  Help needed turning from your back to your side while in a flat bed without using bedrails?: A Little Help needed moving from lying on your back to sitting on the side of a flat bed without using bedrails?: A Little Help needed moving to and from a bed to a chair (including a wheelchair)?: A Little Help needed standing up from a chair using your arms (e.g., wheelchair or  bedside chair)?: A Little Help needed to walk in hospital room?: A Lot Help needed climbing 3-5 steps with a railing? : A Lot 6 Click Score: 16    End of Session Equipment Utilized During Treatment: Oxygen Activity Tolerance: Patient limited by fatigue;Other (comment) (increased O2 demand) Patient left: with call bell/phone within reach;in bed;with bed alarm set Nurse Communication: Mobility status PT Visit Diagnosis: Other abnormalities of gait and mobility (R26.89);Muscle weakness (generalized) (M62.81)     Time: 0223-3612 PT Time Calculation (min) (ACUTE ONLY): 22 min  Charges:  $Therapeutic Exercise: 8-22 mins                     Ulus Hazen B. Migdalia Dk PT, DPT Acute Rehabilitation Services Pager [phone] Office [phone]    [place] 09/08/2020, 4:28 PM

## 2020-09-08 NOTE — Progress Notes (Addendum)
Inpatient Diabetes Program Recommendations  AACE/ADA: New Consensus Statement on Inpatient Glycemic Control (2015)  Target Ranges:  Prepandial:   less than 140 mg/dL      Peak postprandial:   less than 180 mg/dL (1-2 hours)      Critically ill patients:  140 - 180 mg/dL   Results for [name] [name], [name] [name] (MRN [id number]) as of 09/08/2020 10:55  Ref. Range 09/07/2020 07:42 09/07/2020 12:44 09/07/2020 17:15 09/07/2020 20:11  Glucose-Capillary Latest Ref Range: 70 - 99 mg/dL 180 (H) 185 (H) 243 (H) 275 (H)   Results for [name] [name], [name] [name] (MRN [id number]) as of 09/08/2020 10:55  Ref. Range 09/08/2020 07:32  Glucose-Capillary Latest Ref Range: 70 - 99 mg/dL 224 (H)    Home DM Meds: Levemir 30 units q AM and Levemir 35 units q HS      Novolog 4 units tid with meals  Current Orders: Levemir 14 units AM/ 7 units PM        Novolog 0-15 units TID       Novolog 4 units TID with meals    Solumedrol 40 mg BID    MD- Please consider:   1. Increase PM dose of Levemir to 10 units QHS  2. Increase Novolog Meal Coverage to 6 units TID with meals     --Will follow patient during hospitalization--  Wyn Quaker RN, MSN, CDE Diabetes Coordinator Inpatient Glycemic Control Team Team Pager: [phone] (8a-5p)

## 2020-09-08 NOTE — Telephone Encounter (Signed)
Currently admitted.

## 2020-09-08 NOTE — Progress Notes (Addendum)
PROGRESS NOTE                                                                                                                                                                                                             Patient Demographics:    [name] [name], is a 65 y.o. female, DOB - 11-06-1955, JIR:[id number]  Outpatient Primary MD for the patient is Glendale Chard, MD   Admit date - 08/18/2020   LOS - 67  Chief Complaint  Patient presents with  . Covid Positive  . Chest Pain  . Weakness       Brief Narrative: Patient is a 65 y.o. female with PMHx of renal transplant x2 (latest in 2016) on chronic immunosuppressive agents, DM-2, hypothyroidism-who presented with shortness of breath-found to have acute hypoxic respiratory failure due to COVID-19 pneumonia.  Hospital course complicated by slow improvement from severe hypoxemia, AKI and non-STEMI.  COVID-19 vaccinated status: Vaccinated including booster  Significant Events: 1/10>> Admit to [facility] for acute hypoxic respiratory failure due to COVID-19 pneumonia 1/11>> transfer to ICU due to worsening hypoxemia, AKI/metabolic acidosis/hyperkalemia. 1/14>> transferred to [facility] 1/23>> chest pain-non-STEMI-cardiology consult 1/25>> chest pain-resolved by SL nitroglycerin x1 1/28>> tachypneic- worsening hypoxemia-now on 15 L via NRB and 15 L via HFNC  Significant studies: 1/10>> chest x-ray: Severe multilobar bilateral pneumonia. 1/11>> ultrasound renal transplant: No hydronephrosis 1/16>> CT chest: Multifocal viral pneumonia 1/26>>Echo: EF> 70%, LVH, grade 1 diastolic dysfunction 7/51>> chest x-ray: Progression of diffuse bilateral pulmonary opacitie   COVID-19 medications: Steroids: 1/10>> Remdesivir: 1/10>>1/15 Baricitinib: 1/16>>  Antibiotics: Rocephin: 1/12>> 1/18 Cefepime: 1/28>> Vancomycin: 1/28>>  Microbiology data: 1/10 >>blood culture: No  growth 1/11>> urine culture: Klebsiella pneumoniae  Procedures: None  Consults: PCCM, nephrology, cardiology  DVT prophylaxis: enoxaparin (LOVENOX) injection 30 mg Start: 09/03/20 1400 SCDs Start: 08/19/20 0933    Subjective:   Patient in bed, appears comfortable, denies any headache, no fever, no chest pain or pressure, ++ shortness of breath , no abdominal pain. No focal weakness.   Assessment  & Plan :   Acute Hypoxic Resp Failure due to Covid 19 Viral pneumonia: she has been fully vaccinated however she was immunocompromised due to her underlying history of renal transplant and being on immunosuppressive medications.  She developed severe parenchymal lung injury due to COVID-19 pneumonia/ARDS, has been treated with IV steroids,  Baricitinib and Remdesivir combination along with as needed Lasix.  She has also been given a trial of IV antibiotics for any superimposed bacterial component to her pneumonia  Despite aggressive measures she remains severely hypoxic, detailed discussion between treatment team and family has been done by previous MD and me.  Goal of care is gentle medical treatment to continue, if significant decline or worsening focus on comfort measures.   Although she has leukocytosis I think this is largely due to steroids, her procalcitonin has been undetectable, I will at this time stop antibiotics on 09/07/2020, no productive cough or focal infiltrate on x-ray.  Will continue Solu-Medrol taper, she has finished Remdesivir and Baricitinib.  Heated high flow and supportive care will continue.  If any clinical worsening or discomfort full comfort care will be initiated, I have discussed this with daughter personally on 09/06/2020.  Prone/Incentive Spirometry: encouraged  incentive spirometry use 3-4/hour.  O2 requirements:   SpO2: 92 % O2 Flow Rate (L/min): 30 L/min FiO2 (%): 70 %     Recent Labs  Lab 09/03/20 0059 09/04/20 0131 09/05/20 0129 09/05/20 0821  09/05/20 1110 09/06/20 0428 09/07/20 0315 09/08/20 0307  WBC 14.0* 11.8* 12.0*  --   --  14.1* 15.8* 14.1*  HGB 7.9* 7.6* 8.6*  --   --  8.3* 8.3* 7.4*  HCT 24.6* 23.1* 27.0*  --   --  25.5* 23.9* 21.8*  PLT 103* 101* 105*  --   --  120* 119* 101*  CRP 0.7 0.6  --  0.6  --  <0.5 0.6 0.7  BNP  --   --  107.0*  --   --   --  191.7* 136.7*  DDIMER 0.35 <0.27 0.40  --   --  0.43 0.32 0.30  PROCALCITON  --   --   --   --  <0.10  --  <0.10 <0.10  AST 111* 56*  --   --   --  50* 56* 65*  ALT 285* 220*  --   --   --  176* 161* 151*  ALKPHOS 117 111  --   --   --  156* 172* 185*  BILITOT 0.7 1.0  --   --   --  1.3* 1.2 1.1  ALBUMIN 2.6* 2.7* 3.0*  --   --  3.1*  3.1* 3.0* 2.9*     Sinus pause: Occurred on 1/20-approximately 4.25 seconds-this occurred while she was sleeping-after discussion with cardiology-beta-blocker discontinued.  Needs outpatient polysomnography.  Non-STEMI: Developed severe chest pain on 1/23 with EKG changes-relieved with nitroglycerin and morphine infusion-chest pain reoccurred on 1/25.  Echo is reassuring with preserved EF.  On aspirin/statin-has completed the 48 hours of IV heparin.  Cardiology has signed off.  AKI on CKD stage IV-transplanted kidney (second renal transplant-01/2015): AKI hemodynamically mediated-creatinine has overall improved-and plateaued to around 1.9 range-but lately has started to trend up. Nephrology reconsulted-Myfortic remains on hold-on Prograf and steroids-Prograf levels pending.  Nephrology following, has developed some crackles on 09/08/2020 with hyperkalemia, will give low-dose Lasix x1 oral on 09/08/2020.  Hyperkalemia/anion gap metabolic acidosis: Secondary to AKI-has resolved.  Transaminitis: LFTs gradually getting better-continue to monitor closely-watch closely as on statins.    Anemia: Combination of anemia of chronic disease and critical illness.  Transfuse if hemoglobin drops below 7, no signs of active bleeding, will check anemia  panel type and screen and monitor.  Thrombocytopenia: Mild-stable-gradually improving-watch closely.   Complicated UTI: Completed a course of Rocephin.  Afebrile and  nontoxic-appearing.  DKA: Resolved-no longer on insulin drip.  DM-2 (A1c 8.2 on 1/11) with uncontrolled hyperglycemia due to steroids: Hypoglycemic episode reoccurred this morning-Levemir dosage has been adjusted for the past few days-steroid dosage significantly escalated today-suspect at risk for hypoglycemia. Continue Levemir at current doses-watch closely-if hypoglycemia reoccurs-may need to adjust further.  Recent Labs    09/07/20 1715 09/07/20 2011 09/08/20 0732  GLUCAP 243* 275* 224*    Hypothyroidism: Continue levothyroxine-given suppressed TSH-I have decreased dosage to 50 mcg-repeat TSH in 3 months.  HTN: BP stable-continue manidipine and hydralazine.   HLD: Continue statin.  Obesity: Estimated body mass index is 35.52 kg/m as calculated from the following:   Height as of this encounter: 5\' 2"  (1.575 m).   Weight as of this encounter: 88.1 kg.   RN pressure injury documentation: Pressure Injury 08/20/20 Coccyx Mid Stage 1 -  Intact skin with non-blanchable redness of a localized area usually over a bony prominence. (Active)  08/20/20 0800  Location: Coccyx  Location Orientation: Mid  Staging: Stage 1 -  Intact skin with non-blanchable redness of a localized area usually over a bony prominence.  Wound Description (Comments):   Present on Admission: Yes    GI prophylaxis: PPI  Condition - Extremely Guarded  Family Communication  :  Daughter Mendel Ryder [phone] updated on 09/04/20, 09/06/20  Code Status :  Full Code  Diet :  Diet Order            Diet Carb Modified Fluid consistency: Thin; Room service appropriate? Yes  Diet effective now                  Disposition Plan  :   Status is: Inpatient  Remains inpatient appropriate because:Inpatient level of care appropriate due to severity  of illness   Dispo: The patient is from: Home              Anticipated d/c is to: SNF              Anticipated d/c date is: > 3 days              Patient currently is not medically stable to d/c.   Barriers to discharge: Worsening hypoxia requiring O2 supplementation  Antimicorbials  :    Anti-infectives (From admission, onward)   Start     Dose/Rate Route Frequency Ordered Stop   09/07/20 1030  vancomycin (VANCOCIN) IVPB 1000 mg/200 mL premix  Status:  Discontinued        1,000 mg 200 mL/hr over 60 Minutes Intravenous Every 48 hours 09/05/20 0933 09/07/20 0951   09/05/20 1030  vancomycin (VANCOREADY) IVPB 2000 mg/400 mL        2,000 mg 200 mL/hr over 120 Minutes Intravenous  Once 09/05/20 0933 09/05/20 2018   09/05/20 1030  ceFEPIme (MAXIPIME) 2 g in sodium chloride 0.9 % 100 mL IVPB  Status:  Discontinued        2 g 200 mL/hr over 30 Minutes Intravenous Every 24 hours 09/05/20 0933 09/07/20 0951   08/20/20 1445  cefTRIAXone (ROCEPHIN) 1 g in sodium chloride 0.9 % 100 mL IVPB        1 g 200 mL/hr over 30 Minutes Intravenous Every 24 hours 08/20/20 1348 08/26/20 2147   08/20/20 1000  remdesivir 100 mg in sodium chloride 0.9 % 100 mL IVPB       "Followed by" Linked Group Details   100 mg 200 mL/hr over 30 Minutes Intravenous Daily 08/19/20 0021  08/23/20 1113   08/19/20 0130  remdesivir 200 mg in sodium chloride 0.9% 250 mL IVPB       "Followed by" Linked Group Details   200 mg 580 mL/hr over 30 Minutes Intravenous Once 08/19/20 0021 08/19/20 0314      Inpatient Medications  Scheduled Meds: . amLODipine  10 mg Oral Daily  . vitamin C  500 mg Oral Daily  . aspirin EC  81 mg Oral Daily  . atorvastatin  40 mg Oral Daily  . enoxaparin (LOVENOX) injection  30 mg Subcutaneous Q24H  . feeding supplement (NEPRO CARB STEADY)  237 mL Oral BID BM  . fluticasone  1 spray Each Nare Daily  . furosemide  40 mg Oral Once  . hydrALAZINE  25 mg Oral Q8H  . insulin aspart  0-15 Units  Subcutaneous TID WC  . insulin aspart  4 Units Subcutaneous TID WC  . insulin detemir  14 Units Subcutaneous q morning - 10a  . insulin detemir  7 Units Subcutaneous QHS  . levothyroxine  50 mcg Oral Q0600  . LORazepam  1 mg Oral BID  . mouth rinse  15 mL Mouth Rinse BID  . methylPREDNISolone (SOLU-MEDROL) injection  40 mg Intravenous Q12H  . nitroGLYCERIN  0.5 inch Topical Q6H  . pantoprazole  40 mg Oral BID  . zinc sulfate  220 mg Oral Daily   Continuous Infusions: . sodium chloride    . sodium chloride Stopped (08/31/20 1545)   PRN Meds:.sodium chloride, acetaminophen **OR** acetaminophen, albuterol, guaiFENesin, hydrALAZINE, loperamide, morphine injection, nitroGLYCERIN, ondansetron (ZOFRAN) IV, polyethylene glycol, sodium chloride   Time Spent in minutes  45   The patient is critically ill with multiple organ system failure and requires high complexity decision making for assessment and support, frequent evaluation and titration of therapies, advanced monitoring, review of radiographic studies and interpretation of complex data.    See all Orders from today for further details   Lala Lund M.D on 09/08/2020 at 10:52 AM  To page go to [email or web address] - use universal password  [facility] -  Office  [phone]    Objective:   Vitals:   09/08/20 0312 09/08/20 0313 09/08/20 0600 09/08/20 0730  BP: 133/73  135/65 (!) 145/72  Pulse: 98   100  Resp: (!) 27  (!) 28 20  Temp: 98.3 F (36.8 C)  98 F (36.7 C) 97.8 F (36.6 C)  TempSrc: Oral  Oral Oral  SpO2: 100%  94% 92%  Weight:  88.1 kg    Height:        Wt Readings from Last 3 Encounters:  09/08/20 88.1 kg  05/14/20 92.5 kg  05/14/20 92.9 kg     Intake/Output Summary (Last 24 hours) at 09/08/2020 1052 Last data filed at 09/08/2020 0918 Gross per 24 hour  Intake 360 ml  Output 550 ml  Net -190 ml     Physical Exam  Awake Alert, No new F.N deficits, Normal affect [place].AT,PERRAL Supple Neck,No  JVD, No cervical lymphadenopathy appriciated.  Symmetrical Chest wall movement, Good air movement bilaterally, few rales RRR,No Gallops, Rubs or new Murmurs, No Parasternal Heave +ve B.Sounds, Abd Soft, No tenderness, No organomegaly appriciated, No rebound - guarding or rigidity. No Cyanosis, Clubbing or edema, No new Rash or bruise     Data Review:    CBC Recent Labs  Lab 09/04/20 0131 09/05/20 0129 09/06/20 0428 09/07/20 0315 09/08/20 0307  WBC 11.8* 12.0* 14.1* 15.8* 14.1*  HGB 7.6* 8.6* 8.3* 8.3*  7.4*  HCT 23.1* 27.0* 25.5* 23.9* 21.8*  PLT 101* 105* 120* 119* 101*  MCV 77.5* 79.4* 78.9* 78.1* 78.1*  MCH 25.5* 25.3* 25.7* 27.1 26.5  MCHC 32.9 31.9 32.5 34.7 33.9  RDW 16.4* 17.4* 18.1* 18.7* 19.0*  LYMPHSABS  --   --   --  0.3* 0.2*  MONOABS  --   --   --  0.3 0.2  EOSABS  --   --   --  0.0 0.0  BASOSABS  --   --   --  0.0 0.0    Chemistries  Recent Labs  Lab 09/03/20 0059 09/04/20 0131 09/05/20 0129 09/06/20 0428 09/07/20 0315 09/08/20 0307  NA 141 140 140 139 140 135  K 4.4 4.0 4.4 4.3 4.7 5.3*  CL 106 105 107 103 103 102  CO2 25 26 22 23 24 23   GLUCOSE 143* 66* 98 146* 182* 250*  BUN 61* 53* 48* 48* 49* 55*  CREATININE 2.04* 2.11* 2.12* 2.46* 2.48* 2.40*  CALCIUM 9.7 9.6 9.6 9.9 10.0 9.9  MG  --   --   --   --  2.3 2.3  AST 111* 56*  --  50* 56* 65*  ALT 285* 220*  --  176* 161* 151*  ALKPHOS 117 111  --  156* 172* 185*  BILITOT 0.7 1.0  --  1.3* 1.2 1.1   ------------------------------------------------------------------------------------------------------------------ No results for input(s): CHOL, HDL, LDLCALC, TRIG, CHOLHDL, LDLDIRECT in the last 72 hours.  Lab Results  Component Value Date   HGBA1C 8.2 (H) 08/19/2020   ------------------------------------------------------------------------------------------------------------------ No results for input(s): TSH, T4TOTAL, T3FREE, THYROIDAB in the last 72 hours.  Invalid input(s):  FREET3 ------------------------------------------------------------------------------------------------------------------ No results for input(s): VITAMINB12, FOLATE, FERRITIN, TIBC, IRON, RETICCTPCT in the last 72 hours.  Coagulation profile No results for input(s): INR, PROTIME in the last 168 hours.  Recent Labs    09/07/20 0315 09/08/20 0307  DDIMER 0.32 0.30    Cardiac Enzymes No results for input(s): CKMB, TROPONINI, MYOGLOBIN in the last 168 hours.  Invalid input(s): CK ------------------------------------------------------------------------------------------------------------------    Component Value Date/Time   BNP 136.7 (H) 09/08/2020 0307    Micro Results Recent Results (from the past 240 hour(s))  MRSA PCR Screening     Status: None   Collection Time: 09/07/20  9:37 AM   Specimen: Nasopharyngeal  Result Value Ref Range Status   MRSA by PCR NEGATIVE NEGATIVE Final    Comment:        The GeneXpert MRSA Assay (FDA approved for NASAL specimens only), is one component of a comprehensive MRSA colonization surveillance program. It is not intended to diagnose MRSA infection nor to guide or monitor treatment for MRSA infections. Performed at [facility], [place] [address], [place], [place] [address]     Radiology Reports CT CHEST WO CONTRAST  Result Date: 08/24/2020 CLINICAL DATA:  Severe hypoxia and respiratory failure with COVID-19 infection. EXAM: CT CHEST WITHOUT CONTRAST TECHNIQUE: Multidetector CT imaging of the chest was performed following the standard protocol without IV contrast. COMPARISON:  Chest x-ray 08/18/2020 FINDINGS: Cardiovascular: Mild cardiomegaly. Mild calcification over the mitral valve annulus. Thoracic aorta is normal caliber. Stent over the right subclavian/axillary region. Remaining vascular structures are unremarkable. Mediastinum/Nodes: No evidence of mediastinal or hilar adenopathy. Remaining mediastinal structures are unremarkable.  Lungs/Pleura: Lungs are adequately inflated demonstrate moderate bilateral hazy airspace process likely multifocal viral pneumonia in this COVID-19 positive patient. No evidence of effusion. Airways are otherwise unremarkable. Upper Abdomen: No acute  findings. Musculoskeletal: Degenerative change of the spine. Mild anterior wedging of a midthoracic vertebral body likely chronic. IMPRESSION: 1. Moderate bilateral hazy airspace process likely multifocal viral pneumonia in this COVID-19 positive patient. 2. Mild cardiomegaly. 3. Mild anterior wedging of a midthoracic vertebral body likely chronic. Electronically Signed   By: Marin Olp M.D.   On: 08/24/2020 12:37   US Renal Transplant w/Doppler  Result Date: 08/19/2020 CLINICAL DATA:  Acute kidney injury EXAM: ULTRASOUND OF RENAL TRANSPLANT WITH RENAL DOPPLER ULTRASOUND TECHNIQUE: Ultrasound examination of the renal transplant was performed with gray-scale, color and duplex doppler evaluation. COMPARISON:  None. FINDINGS: Transplant kidney location: Left lower quadrant Transplant Kidney: Renal measurements: 10.8 x 6.2 x 6.5 cm = volume: 29mL. Normal in size and parenchymal echogenicity. No evidence of mass or hydronephrosis. No peri-transplant fluid collection seen. Color flow in the main renal artery:  Yes Color flow in the main renal vein:  Yes Duplex Doppler Evaluation: Main Renal Artery Velocity:  cm/sec Main Renal Artery Resistive Index: 0.99 Venous waveform in main renal vein:  Present Intrarenal resistive index in upper pole:  0.98 (normal 0.6-0.8; equivocal 0.8-0.9; abnormal >= 0.9) Intrarenal resistive index in lower pole: 0.98 (normal 0.6-0.8; equivocal 0.8-0.9; abnormal >= 0.9) Bladder: Normal for degree of bladder distention. Other findings:  None. IMPRESSION: Elevated resistive indices within the left lower quadrant transplant kidney. No hydronephrosis. Electronically Signed   By: Rolm Baptise M.D.   On: 08/19/2020 19:15   DG Chest Port 1  View  Result Date: 09/07/2020 CLINICAL DATA:  Post renal transplant x 2 most recently 2016, chronic immunosuppressive therapy, type II diabetes mellitus, presents with shortness of breath, acute hypoxic respiratory failure due to COVID-19 pneumonia EXAM: PORTABLE CHEST 1 VIEW COMPARISON:  Portable exam 1005 hours compared to 09/05/2020 FINDINGS: Enlargement of cardiac silhouette. Mediastinal contours and pulmonary vascularity normal. Atherosclerotic calcification aorta. BILATERAL scattered mixed airspace interstitial infiltrates consistent with multifocal pneumonia and COVID-19. No pleural effusion or pneumothorax. Bones demineralized with stent identified at the RIGHT axillary vessels. IMPRESSION: Persistent diffuse BILATERAL pulmonary infiltrates consistent with multifocal pneumonia and COVID-19. Electronically Signed   By: Lavonia Dana M.D.   On: 09/07/2020 10:28   DG CHEST PORT 1 VIEW  Result Date: 08/31/2020 CLINICAL DATA:  65 year old female with shortness of breath. EXAM: PORTABLE CHEST 1 VIEW COMPARISON:  Chest radiograph dated 08/18/2020. FINDINGS: Bilateral confluent and reticular densities, progressed since the prior radiograph and most consistent with multifocal pneumonia. No pleural effusion pneumothorax. Borderline cardiomegaly. No acute osseous pathology. IMPRESSION: Interval progression of bilateral pulmonary opacities compared to the radiograph of 08/18/2020. Continued follow-up recommended. Electronically Signed   By: Anner Crete M.D.   On: 08/31/2020 21:17   DG Chest Portable 1 View  Result Date: 08/18/2020 CLINICAL DATA:  65 year old female with history of COVID infection. Chest pain. Shortness of breath. EXAM: PORTABLE CHEST 1 VIEW COMPARISON:  Chest x-ray 10/20/2014. FINDINGS: Patchy multifocal airspace consolidation and areas of interstitial prominence are noted throughout the lungs bilaterally, most severe throughout the periphery of the mid to lower left lung, indicative of  multilobar bilateral pneumonia. No definite pleural effusions. No evidence of pulmonary edema. No pneumothorax. Heart size is borderline enlarged. Upper mediastinal contours are within normal limits. Atherosclerotic calcifications in the thoracic aorta. Vascular stent projecting over the right subclavian region. IMPRESSION: 1. Severe multilobar bilateral (left greater than right) pneumonia compatible with reported COVID infection. 2. Aortic atherosclerosis. Electronically Signed   By: Vinnie Langton M.D.   On: 08/18/2020  18:02   DG Chest Port 1V same Day  Result Date: 09/05/2020 CLINICAL DATA:  Shortness of breath, COVID positive EXAM: PORTABLE CHEST 1 VIEW COMPARISON:  08/31/2020 FINDINGS: Persistent diffuse bilateral pulmonary opacities. Aeration has decreased since the prior study. No significant pleural effusion. No pneumothorax. Stable cardiomediastinal contours. IMPRESSION: Progression of diffuse bilateral pulmonary opacities. Electronically Signed   By: Macy Mis M.D.   On: 09/05/2020 08:05   ECHOCARDIOGRAM LIMITED  Result Date: 09/02/2020    ECHOCARDIOGRAM LIMITED REPORT   Patient Name:   [facility] Date of Exam: 09/02/2020 Medical Rec #:  [id number]               Height:       62.0 in Accession #:    [id number]              Weight:       194.4 lb Date of Birth:  1956-07-30              BSA:          1.889 m Patient Age:    15 years                BP:           135/66 mmHg Patient Gender: F                       HR:           100 bpm. Exam Location:  Inpatient Procedure: Limited Echo, Limited Color Doppler and Cardiac Doppler Indications:    chest pain  History:        Patient has prior history of Echocardiogram examinations, most                 recent 09/30/2006. Covid; Risk Factors:Diabetes and Hypertension.  Sonographer:    Johny Chess Referring Phys: [place]  1. Left ventricular ejection fraction, by estimation, is >75%. The left ventricle has  hyperdynamic function. The left ventricle has no regional wall motion abnormalities. There is moderate left ventricular hypertrophy. Left ventricular diastolic parameters are consistent with Grade I diastolic dysfunction (impaired relaxation).  2. Right ventricular systolic function is normal. The right ventricular size is normal.  3. The mitral valve is normal in structure. No evidence of mitral valve regurgitation. No evidence of mitral stenosis. Moderate mitral annular calcification.  4. The aortic valve is tricuspid. Aortic valve regurgitation is not visualized. Mild aortic valve sclerosis is present, with no evidence of aortic valve stenosis.  5. The inferior vena cava is normal in size with greater than 50% respiratory variability, suggesting right atrial pressure of 3 mmHg. FINDINGS  Left Ventricle: Left ventricular ejection fraction, by estimation, is >75%. The left ventricle has hyperdynamic function. The left ventricle has no regional wall motion abnormalities. The left ventricular internal cavity size was normal in size. There is moderate left ventricular hypertrophy. Left ventricular diastolic parameters are consistent with Grade I diastolic dysfunction (impaired relaxation). Right Ventricle: The right ventricular size is normal. Right ventricular systolic function is normal. Left Atrium: Left atrial size was normal in size. Right Atrium: Right atrial size was normal in size. Pericardium: There is no evidence of pericardial effusion. Mitral Valve: The mitral valve is normal in structure. Moderate mitral annular calcification. No evidence of mitral valve stenosis. Tricuspid Valve: The tricuspid valve is normal in structure. Tricuspid valve regurgitation is trivial. No evidence of tricuspid stenosis. Aortic Valve: The  aortic valve is tricuspid. Aortic valve regurgitation is not visualized. Mild aortic valve sclerosis is present, with no evidence of aortic valve stenosis. Pulmonic Valve: The pulmonic valve  was normal in structure. Pulmonic valve regurgitation is not visualized. No evidence of pulmonic stenosis. Aorta: The aortic root is normal in size and structure. Venous: The inferior vena cava is normal in size with greater than 50% respiratory variability, suggesting right atrial pressure of 3 mmHg. IAS/Shunts: No atrial level shunt detected by color flow Doppler. LEFT VENTRICLE PLAX 2D LVIDd:         3.70 cm  Diastology LVIDs:         2.50 cm  LV e' medial:    5.33 cm/s LV PW:         1.00 cm  LV E/e' medial:  13.8 LV IVS:        0.90 cm  LV e' lateral:   5.66 cm/s LVOT diam:     1.70 cm  LV E/e' lateral: 13.0 LV SV:         59 LV SV Index:   31 LVOT Area:     2.27 cm  IVC IVC diam: 1.30 cm LEFT ATRIUM         Index LA diam:    3.90 cm 2.06 cm/m  AORTIC VALVE LVOT Vmax:   170.00 cm/s LVOT Vmean:  114.000 cm/s LVOT VTI:    0.258 m  AORTA Ao Asc diam: 2.60 cm MV E velocity: 73.50 cm/s MV A velocity: 121.00 cm/s  SHUNTS MV E/A ratio:  0.61         Systemic VTI:  0.26 m                             Systemic Diam: 1.70 cm Kirk Ruths MD Electronically signed by Kirk Ruths MD Signature Date/Time: 09/02/2020/12:26:37 PM    Final

## 2020-09-08 NOTE — Progress Notes (Signed)
[facility] Progress Note   65 yo F with hx of DDRT x2 (latest 2016) on prograf, myfortic, and low-dose prednisone, T2DM, hypothyroidism, and recent diagnosis of COVID-19 presenting with acute hypoxic respiratory failure 2/2 COVID PNA. Also found to be in DKA with worsening AoCKD and hyperkalemia on labs.  Electrolyte disturbances improved.  Patient is slowly improving but creatinine remains consistently over 2  Assessment:    1. AKI on CKD;  Cr presentation 4, nadir 1.9 1/17 to 1/24 then uptrending to 2.46 1/29 currently stable and no indications for dialysis  2. H/O DDKT x 2 (latest 2016). Baseline Cr ?1.7 but probably around 1.5-2.Follows w/ Dr. Marval Regal ([facility]) & [facility]-  Needing to hold prograf and cellcept for COVID with worsening status 3. Chronic immunosuppressionTac/MMF/Pred- as above - prograf and cellcept on hold 4. Hypoxic respiratory faiolure secondary COVID-19 PNA; acutely worsened 1/28 and broad spectrum antibiotics added.  Continued hypoxia 5. DKA, h/o uncontrolled DM2w/ hyperglycemia, resolved 6. Hyperkalemia, intermittent. Given a dose of kaexylate yesterday-  No labs today but ordered for tomorrow  7. Anion Gap Metabolic acidosis, resolved/stable on NaHCO3 8. HTN-  Doesn't appear to be a volume overload primary issue  Plan: Pilar Plate discussion  re: status and recommended d/c prograf- done on 1/30.  She understands there is a risk for rejection but in light of worsening pulmonary status it's necessary.  -Continue monitor creatinine daily - holding MMF as well   Dispo - tenuous; code status changed to DNR with plans for comfort care if worse.  Subjective:    Continues to require high levels of O2  565mL UOP documented    Objective:   BP (!) 145/72 (BP Location: Right Arm)   Pulse 100   Temp 97.8 F (36.6 C) (Oral)   Resp 20   Ht 5\' 2"  (1.575 m)   Wt 88.1 kg   SpO2 92%   BMI 35.52 kg/m   Intake/Output Summary (Last 24  hours) at 09/08/2020 1158 Last data filed at 09/08/2020 7253 Gross per 24 hour  Intake 360 ml  Output 550 ml  Net -190 ml   Weight change: 0.9 kg  Physical Exam: pt was not personally examined today-  This is exam from yest GEN: NAD, NCAT HEENT: No conjunctival pallor, EOMI NECK: Supple, no thyromegaly LUNGS: bilateral chest rise, iwob present, pursed lip breathing today, can't speak in full sentences; no rales CV: normal rate, no audible murmur; hyperdynamic ABD: SNDNT +BS  EXT: No lower extremity edema, warm and well perfused ACCESS: rt BCF aneurysmal pulsatile   Imaging: DG Chest Port 1 View  Result Date: 09/07/2020 CLINICAL DATA:  Post renal transplant x 2 most recently 2016, chronic immunosuppressive therapy, type II diabetes mellitus, presents with shortness of breath, acute hypoxic respiratory failure due to COVID-19 pneumonia EXAM: PORTABLE CHEST 1 VIEW COMPARISON:  Portable exam 1005 hours compared to 09/05/2020 FINDINGS: Enlargement of cardiac silhouette. Mediastinal contours and pulmonary vascularity normal. Atherosclerotic calcification aorta. BILATERAL scattered mixed airspace interstitial infiltrates consistent with multifocal pneumonia and COVID-19. No pleural effusion or pneumothorax. Bones demineralized with stent identified at the RIGHT axillary vessels. IMPRESSION: Persistent diffuse BILATERAL pulmonary infiltrates consistent with multifocal pneumonia and COVID-19. Electronically Signed   By: Lavonia Dana M.D.   On: 09/07/2020 10:28    Labs: BMET Recent Labs  Lab 09/02/20 0202 09/03/20 0059 09/04/20 0131 09/05/20 0129 09/06/20 0428 09/07/20 0315 09/08/20 0307  NA 140 141 140 140 139 140 135  K 5.2* 4.4 4.0 4.4  4.3 4.7 5.3*  CL 107 106 105 107 103 103 102  CO2 23 25 26 22 23 24 23   GLUCOSE 185* 143* 66* 98 146* 182* 250*  BUN 63* 61* 53* 48* 48* 49* 55*  CREATININE 1.96* 2.04* 2.11* 2.12* 2.46* 2.48* 2.40*  CALCIUM 9.7 9.7 9.6 9.6 9.9 10.0 9.9  PHOS  --    --   --  3.4 5.5*  --   --    CBC Recent Labs  Lab 09/05/20 0129 09/06/20 0428 09/07/20 0315 09/08/20 0307  WBC 12.0* 14.1* 15.8* 14.1*  NEUTROABS  --   --  15.0* 13.5*  HGB 8.6* 8.3* 8.3* 7.4*  HCT 27.0* 25.5* 23.9* 21.8*  MCV 79.4* 78.9* 78.1* 78.1*  PLT 105* 120* 119* 101*    Medications:    . amLODipine  10 mg Oral Daily  . vitamin C  500 mg Oral Daily  . aspirin EC  81 mg Oral Daily  . atorvastatin  40 mg Oral Daily  . enoxaparin (LOVENOX) injection  30 mg Subcutaneous Q24H  . feeding supplement (NEPRO CARB STEADY)  237 mL Oral BID BM  . fluticasone  1 spray Each Nare Daily  . furosemide  40 mg Oral Once  . hydrALAZINE  25 mg Oral Q8H  . insulin aspart  0-15 Units Subcutaneous TID WC  . insulin aspart  4 Units Subcutaneous TID WC  . insulin detemir  14 Units Subcutaneous q morning - 10a  . insulin detemir  7 Units Subcutaneous QHS  . levothyroxine  50 mcg Oral Q0600  . LORazepam  1 mg Oral BID  . mouth rinse  15 mL Mouth Rinse BID  . [START ON 09/09/2020] methylPREDNISolone (SOLU-MEDROL) injection  40 mg Intravenous Daily  . nitroGLYCERIN  0.5 inch Topical Q6H  . pantoprazole  40 mg Oral BID  . zinc sulfate  220 mg Oral Daily    Louis Meckel, MD  09/08/2020, 11:58 AM

## 2020-09-09 ENCOUNTER — Inpatient Hospital Stay (HOSPITAL_COMMUNITY): Payer: HMO

## 2020-09-09 DIAGNOSIS — J9601 Acute respiratory failure with hypoxia: Secondary | ICD-10-CM | POA: Diagnosis not present

## 2020-09-09 DIAGNOSIS — U071 COVID-19: Secondary | ICD-10-CM | POA: Diagnosis not present

## 2020-09-09 LAB — CBC WITH DIFFERENTIAL/PLATELET
Abs Immature Granulocytes: 0.17 10*3/uL — ABNORMAL HIGH (ref 0.00–0.07)
Basophils Absolute: 0 10*3/uL (ref 0.0–0.1)
Basophils Relative: 0 %
Eosinophils Absolute: 0 10*3/uL (ref 0.0–0.5)
Eosinophils Relative: 0 %
HCT: 21.1 % — ABNORMAL LOW (ref 36.0–46.0)
Hemoglobin: 7.1 g/dL — ABNORMAL LOW (ref 12.0–15.0)
Immature Granulocytes: 1 %
Lymphocytes Relative: 2 %
Lymphs Abs: 0.3 10*3/uL — ABNORMAL LOW (ref 0.7–4.0)
MCH: 26.6 pg (ref 26.0–34.0)
MCHC: 33.6 g/dL (ref 30.0–36.0)
MCV: 79 fL — ABNORMAL LOW (ref 80.0–100.0)
Monocytes Absolute: 0.4 10*3/uL (ref 0.1–1.0)
Monocytes Relative: 3 %
Neutro Abs: 14.5 10*3/uL — ABNORMAL HIGH (ref 1.7–7.7)
Neutrophils Relative %: 94 %
Platelets: 99 10*3/uL — ABNORMAL LOW (ref 150–400)
RBC: 2.67 MIL/uL — ABNORMAL LOW (ref 3.87–5.11)
RDW: 19.4 % — ABNORMAL HIGH (ref 11.5–15.5)
WBC: 15.4 10*3/uL — ABNORMAL HIGH (ref 4.0–10.5)
nRBC: 0 % (ref 0.0–0.2)

## 2020-09-09 LAB — PROCALCITONIN: Procalcitonin: 0.13 ng/mL

## 2020-09-09 LAB — COMPREHENSIVE METABOLIC PANEL
ALT: 245 U/L — ABNORMAL HIGH (ref 0–44)
AST: 110 U/L — ABNORMAL HIGH (ref 15–41)
Albumin: 2.8 g/dL — ABNORMAL LOW (ref 3.5–5.0)
Alkaline Phosphatase: 233 U/L — ABNORMAL HIGH (ref 38–126)
Anion gap: 12 (ref 5–15)
BUN: 59 mg/dL — ABNORMAL HIGH (ref 8–23)
CO2: 24 mmol/L (ref 22–32)
Calcium: 9.8 mg/dL (ref 8.9–10.3)
Chloride: 103 mmol/L (ref 98–111)
Creatinine, Ser: 2.46 mg/dL — ABNORMAL HIGH (ref 0.44–1.00)
GFR, Estimated: 21 mL/min — ABNORMAL LOW (ref >60)
Glucose, Bld: 111 mg/dL — ABNORMAL HIGH (ref 70–99)
Potassium: 4 mmol/L (ref 3.5–5.1)
Sodium: 139 mmol/L (ref 135–145)
Total Bilirubin: 1.3 mg/dL — ABNORMAL HIGH (ref 0.3–1.2)
Total Protein: 5.3 g/dL — ABNORMAL LOW (ref 6.5–8.1)

## 2020-09-09 LAB — HEPATITIS PANEL, ACUTE
HCV Ab: NONREACTIVE
Hep A IgM: NONREACTIVE
Hep B C IgM: NONREACTIVE

## 2020-09-09 LAB — D-DIMER, QUANTITATIVE: D-Dimer, Quant: 0.42 ug/mL-FEU (ref 0.00–0.50)

## 2020-09-09 LAB — GLUCOSE, CAPILLARY
Glucose-Capillary: 125 mg/dL — ABNORMAL HIGH (ref 70–99)
Glucose-Capillary: 147 mg/dL — ABNORMAL HIGH (ref 70–99)
Glucose-Capillary: 165 mg/dL — ABNORMAL HIGH (ref 70–99)
Glucose-Capillary: 122 mg/dL — ABNORMAL HIGH (ref 70–99)

## 2020-09-09 LAB — TACROLIMUS LEVEL: Tacrolimus (FK506) - LabCorp: 3.1 ng/mL (ref 2.0–20.0)

## 2020-09-09 LAB — BRAIN NATRIURETIC PEPTIDE: B Natriuretic Peptide: 113.1 pg/mL — ABNORMAL HIGH (ref 0.0–100.0)

## 2020-09-09 LAB — C-REACTIVE PROTEIN: CRP: 0.6 mg/dL (ref <1.0)

## 2020-09-09 MED ORDER — DARBEPOETIN ALFA 150 MCG/0.3ML IJ SOSY
150.0000 ug | PREFILLED_SYRINGE | INTRAMUSCULAR | Status: DC
  Administered 2020-09-09 – 2020-09-23 (×3): 150 ug via SUBCUTANEOUS
  Filled 2020-09-09 (×4): qty 0.3

## 2020-09-09 MED ORDER — ISOSORBIDE MONONITRATE ER 60 MG PO TB24
60.0000 mg | ORAL_TABLET | Freq: Every day | ORAL | Status: DC
  Administered 2020-09-09 – 2020-10-13 (×35): 60 mg via ORAL
  Filled 2020-09-09 (×34): qty 1

## 2020-09-09 NOTE — Progress Notes (Signed)
Pt tachycardiac and tachypneic.    09/08/20 2000  Assess: MEWS Score  Temp 98 F (36.7 C)  BP (!) 142/69  Pulse Rate (!) 109  ECG Heart Rate (!) 109  Resp (!) 25  Level of Consciousness Alert  SpO2 100 %  Assess: MEWS Score  MEWS Temp 0  MEWS Systolic 0  MEWS Pulse 1  MEWS RR 1  MEWS LOC 0  MEWS Score 2  MEWS Score Color Yellow  Assess: if the MEWS score is Yellow or Red  Were vital signs taken at a resting state? Yes  Focused Assessment No change from prior assessment  Early Detection of Sepsis Score *See Row Information* Medium  MEWS guidelines implemented *See Row Information* Yes  Treat  MEWS Interventions Administered scheduled meds/treatments  Take Vital Signs  Increase Vital Sign Frequency  Yellow: Q 2hr X 2 then Q 4hr X 2, if remains yellow, continue Q 4hrs  Escalate  MEWS: Escalate Yellow: discuss with charge nurse/RN and consider discussing with provider and RRT  Notify: Charge Nurse/RN  Name of Charge Nurse/RN Notified Vicente Males  Date Charge Nurse/RN Notified 09/08/20  Time Charge Nurse/RN Notified 2020

## 2020-09-09 NOTE — Telephone Encounter (Signed)
Currently admitted.

## 2020-09-09 NOTE — Progress Notes (Signed)
PROGRESS NOTE                                                                                                                                                                                                             Patient Demographics:    [name] [name], is a 65 y.o. female, DOB - 15-Feb-1956, FYT:[id number]  Outpatient Primary MD for the patient is Glendale Chard, MD   Admit date - 08/18/2020   LOS - 21  Chief Complaint  Patient presents with  . Covid Positive  . Chest Pain  . Weakness       Brief Narrative: Patient is a 65 y.o. female with PMHx of renal transplant x2 (latest in 2016) on chronic immunosuppressive agents, DM-2, hypothyroidism-who presented with shortness of breath-found to have acute hypoxic respiratory failure due to COVID-19 pneumonia.  Hospital course complicated by slow improvement from severe hypoxemia, AKI and non-STEMI.  COVID-19 vaccinated status: Vaccinated including booster  Significant Events: 1/10>> Admit to [facility] for acute hypoxic respiratory failure due to COVID-19 pneumonia 1/11>> transfer to ICU due to worsening hypoxemia, AKI/metabolic acidosis/hyperkalemia. 1/14>> transferred to [facility] 1/23>> chest pain-non-STEMI-cardiology consult 1/25>> chest pain-resolved by SL nitroglycerin x1 1/28>> tachypneic- worsening hypoxemia-now on 15 L via NRB and 15 L via HFNC  Significant studies: 1/10>> chest x-ray: Severe multilobar bilateral pneumonia. 1/11>> ultrasound renal transplant: No hydronephrosis 1/16>> CT chest: Multifocal viral pneumonia 1/26>>Echo: EF> 70%, LVH, grade 1 diastolic dysfunction 1/77>> chest x-ray: Progression of diffuse bilateral pulmonary opacitie   COVID-19 medications: Steroids: 1/10>> Remdesivir: 1/10>>1/15 Baricitinib: 1/16>>  Antibiotics: Rocephin: 1/12>> 1/18 Cefepime: 1/28>> Vancomycin: 1/28>>  Microbiology data: 1/10 >>blood culture: No  growth 1/11>> urine culture: Klebsiella pneumoniae  Procedures: None  Consults: PCCM, nephrology, cardiology  DVT prophylaxis: enoxaparin (LOVENOX) injection 30 mg Start: 09/03/20 1400 SCDs Start: 08/19/20 0933    Subjective:   Patient sitting in recliner, denies any headache, no chest or abdominal pain, shortness of breath is slightly improved.   Assessment  & Plan :   Acute Hypoxic Resp Failure due to Covid 19 Viral pneumonia: she has been fully vaccinated however she was immunocompromised due to her underlying history of renal transplant and being on immunosuppressive medications.  She developed severe parenchymal lung injury due to COVID-19 pneumonia/ARDS, has been treated with IV steroids, Baricitinib and Remdesivir combination along with as needed  Lasix.  She has also been given a trial of IV antibiotics for any superimposed bacterial component to her pneumonia  Despite aggressive measures she remains severely hypoxic, detailed discussion between treatment team and family has been done by previous MD and me.  Goal of care is gentle medical treatment to continue, if significant decline or worsening focus on comfort measures.   Although she has leukocytosis I think this is largely due to steroids, her procalcitonin has been undetectable, I will at this time stop antibiotics on 09/07/2020, no productive cough or focal infiltrate on x-ray.  Will continue Solu-Medrol taper, she has finished Remdesivir and Baricitinib.  Heated high flow and supportive care will continue.  If any clinical worsening or discomfort full comfort care will be initiated, I have discussed this with daughter personally on 09/06/2020.  Prone/Incentive Spirometry: encouraged  incentive spirometry use 3-4/hour.  O2 requirements:   SpO2: 100 % O2 Flow Rate (L/min): 25 L/min FiO2 (%): 50 %     Recent Labs  Lab 09/04/20 0131 09/05/20 0129 09/05/20 0821 09/05/20 1110 09/06/20 0428 09/07/20 0315 09/08/20 0307  09/09/20 0136  WBC 11.8* 12.0*  --   --  14.1* 15.8* 14.1* 15.4*  HGB 7.6* 8.6*  --   --  8.3* 8.3* 7.4* 7.1*  HCT 23.1* 27.0*  --   --  25.5* 23.9* 21.8* 21.1*  PLT 101* 105*  --   --  120* 119* 101* 99*  CRP 0.6  --  0.6  --  <0.5 0.6 0.7 0.6  BNP  --  107.0*  --   --   --  191.7* 136.7* 113.1*  DDIMER <0.27 0.40  --   --  0.43 0.32 0.30 0.42  PROCALCITON  --   --   --  <0.10  --  <0.10 <0.10 0.13  AST 56*  --   --   --  50* 56* 65* 110*  ALT 220*  --   --   --  176* 161* 151* 245*  ALKPHOS 111  --   --   --  156* 172* 185* 233*  BILITOT 1.0  --   --   --  1.3* 1.2 1.1 1.3*  ALBUMIN 2.7* 3.0*  --   --  3.1*  3.1* 3.0* 2.9* 2.8*     Sinus pause: Occurred on 1/20-approximately 4.25 seconds-this occurred while she was sleeping-after discussion with cardiology-beta-blocker discontinued.  Needs outpatient polysomnography.  Non-STEMI: Developed severe chest pain on 1/23 with EKG changes-relieved with nitroglycerin and morphine infusion-chest pain reoccurred on 1/25.  Echo is reassuring with preserved EF.  On aspirin/statin-has completed the 48 hours of IV heparin.  Cardiology has signed off.  AKI on CKD stage IV-transplanted kidney (second renal transplant-01/2015): AKI hemodynamically mediated-creatinine has overall improved-and plateaued to around 1.9 range-but lately has started to trend up. Nephrology reconsulted-Myfortic remains on hold-on Prograf and steroids-Prograf levels pending.  Nephrology following, has developed some crackles on 09/08/2020 with hyperkalemia, will give low-dose Lasix x1 oral on 09/08/2020.  Hyperkalemia/anion gap metabolic acidosis: Secondary to AKI-has resolved.  Transaminitis: LFTs have spiked a bit on 09/09/2020, hold statin, acute hepatitis panel, INR and right upper quadrant ultrasound.  Anemia: Combination of anemia of chronic disease and critical illness.  Transfuse if hemoglobin drops below 7, no signs of active bleeding, unremarkable anemia  panel.  Thrombocytopenia: Mild-stable-gradually improving-watch closely.   Complicated UTI: Completed a course of Rocephin.  Afebrile and nontoxic-appearing.  DKA: Resolved-no longer on insulin drip.  DM-2 (A1c 8.2 on  1/11) with uncontrolled hyperglycemia due to steroids: Hypoglycemic episode reoccurred this morning-Levemir dosage has been adjusted for the past few days-steroid dosage significantly escalated today-suspect at risk for hypoglycemia. Continue Levemir at current doses-watch closely-if hypoglycemia reoccurs-may need to adjust further.  Recent Labs    09/08/20 1743 09/08/20 2203 09/09/20 0714  GLUCAP 305* 146* 125*    Hypothyroidism: Continue levothyroxine-given suppressed TSH-I have decreased dosage to 50 mcg-repeat TSH in 3 months.  HTN: BP stable-continue manidipine and hydralazine.   HLD: Continue statin.  Obesity: Estimated body mass index is 34.76 kg/m as calculated from the following:   Height as of this encounter: 5\' 2"  (1.575 m).   Weight as of this encounter: 86.2 kg.   RN pressure injury documentation: Pressure Injury 08/20/20 Coccyx Mid Stage 1 -  Intact skin with non-blanchable redness of a localized area usually over a bony prominence. (Active)  08/20/20 0800  Location: Coccyx  Location Orientation: Mid  Staging: Stage 1 -  Intact skin with non-blanchable redness of a localized area usually over a bony prominence.  Wound Description (Comments):   Present on Admission: Yes    GI prophylaxis: PPI  Condition - Extremely Guarded  Family Communication  :  Daughter Mendel Ryder [phone] updated on 09/04/20, 09/06/20, 09/09/2020  Code Status :  Full Code  Diet :  Diet Order            Diet Carb Modified Fluid consistency: Thin; Room service appropriate? Yes  Diet effective now                  Disposition Plan  :   Status is: Inpatient  Remains inpatient appropriate because:Inpatient level of care appropriate due to severity of  illness   Dispo: The patient is from: Home              Anticipated d/c is to: SNF              Anticipated d/c date is: > 3 days              Patient currently is not medically stable to d/c.   Barriers to discharge: Worsening hypoxia requiring O2 supplementation  Antimicorbials  :    Anti-infectives (From admission, onward)   Start     Dose/Rate Route Frequency Ordered Stop   09/07/20 1030  vancomycin (VANCOCIN) IVPB 1000 mg/200 mL premix  Status:  Discontinued        1,000 mg 200 mL/hr over 60 Minutes Intravenous Every 48 hours 09/05/20 0933 09/07/20 0951   09/05/20 1030  vancomycin (VANCOREADY) IVPB 2000 mg/400 mL        2,000 mg 200 mL/hr over 120 Minutes Intravenous  Once 09/05/20 0933 09/05/20 2018   09/05/20 1030  ceFEPIme (MAXIPIME) 2 g in sodium chloride 0.9 % 100 mL IVPB  Status:  Discontinued        2 g 200 mL/hr over 30 Minutes Intravenous Every 24 hours 09/05/20 0933 09/07/20 0951   08/20/20 1445  cefTRIAXone (ROCEPHIN) 1 g in sodium chloride 0.9 % 100 mL IVPB        1 g 200 mL/hr over 30 Minutes Intravenous Every 24 hours 08/20/20 1348 08/26/20 2147   08/20/20 1000  remdesivir 100 mg in sodium chloride 0.9 % 100 mL IVPB       "Followed by" Linked Group Details   100 mg 200 mL/hr over 30 Minutes Intravenous Daily 08/19/20 0021 08/23/20 1113   08/19/20 0130  remdesivir 200 mg in sodium  chloride 0.9% 250 mL IVPB       "Followed by" Linked Group Details   200 mg 580 mL/hr over 30 Minutes Intravenous Once 08/19/20 0021 08/19/20 0314      Inpatient Medications  Scheduled Meds: . amLODipine  10 mg Oral Daily  . vitamin C  500 mg Oral Daily  . aspirin EC  81 mg Oral Daily  . enoxaparin (LOVENOX) injection  30 mg Subcutaneous Q24H  . feeding supplement (NEPRO CARB STEADY)  237 mL Oral BID BM  . fluticasone  1 spray Each Nare Daily  . hydrALAZINE  25 mg Oral Q8H  . insulin aspart  0-15 Units Subcutaneous TID WC  . insulin aspart  4 Units Subcutaneous TID WC  .  insulin detemir  14 Units Subcutaneous q morning - 10a  . insulin detemir  7 Units Subcutaneous QHS  . isosorbide mononitrate  60 mg Oral Daily  . levothyroxine  50 mcg Oral Q0600  . LORazepam  1 mg Oral BID  . mouth rinse  15 mL Mouth Rinse BID  . methylPREDNISolone (SOLU-MEDROL) injection  40 mg Intravenous Daily  . pantoprazole  40 mg Oral BID  . zinc sulfate  220 mg Oral Daily   Continuous Infusions: . sodium chloride    . sodium chloride Stopped (08/31/20 1545)   PRN Meds:.sodium chloride, acetaminophen **OR** acetaminophen, albuterol, guaiFENesin, hydrALAZINE, loperamide, ondansetron (ZOFRAN) IV, polyethylene glycol, sodium chloride   Time Spent in minutes  45   The patient is critically ill with multiple organ system failure and requires high complexity decision making for assessment and support, frequent evaluation and titration of therapies, advanced monitoring, review of radiographic studies and interpretation of complex data.    See all Orders from today for further details   Lala Lund M.D on 09/09/2020 at 9:24 AM  To page go to [email or web address] - use universal password  [facility] -  Office  [phone]    Objective:   Vitals:   09/09/20 0359 09/09/20 0421 09/09/20 0608 09/09/20 0714  BP: 116/60  122/67 129/75  Pulse: (!) 101   100  Resp: (!) 24   (!) 23  Temp: 97.8 F (36.6 C)   98 F (36.7 C)  TempSrc: Oral   Oral  SpO2: 99%   100%  Weight:  86.2 kg    Height:        Wt Readings from Last 3 Encounters:  09/09/20 86.2 kg  05/14/20 92.5 kg  05/14/20 92.9 kg     Intake/Output Summary (Last 24 hours) at 09/09/2020 0924 Last data filed at 09/09/2020 0924 Gross per 24 hour  Intake 300 ml  Output 150 ml  Net 150 ml     Physical Exam  Awake Alert, No new F.N deficits, Normal affect [place].AT,PERRAL Supple Neck,No JVD, No cervical lymphadenopathy appriciated.  Symmetrical Chest wall movement, Good air movement bilaterally, CTAB RRR,No  Gallops, Rubs or new Murmurs, No Parasternal Heave +ve B.Sounds, Abd Soft, No tenderness, No organomegaly appriciated, No rebound - guarding or rigidity. No Cyanosis, Clubbing or edema, No new Rash or bruise    Data Review:    CBC Recent Labs  Lab 09/05/20 0129 09/06/20 0428 09/07/20 0315 09/08/20 0307 09/09/20 0136  WBC 12.0* 14.1* 15.8* 14.1* 15.4*  HGB 8.6* 8.3* 8.3* 7.4* 7.1*  HCT 27.0* 25.5* 23.9* 21.8* 21.1*  PLT 105* 120* 119* 101* 99*  MCV 79.4* 78.9* 78.1* 78.1* 79.0*  MCH 25.3* 25.7* 27.1 26.5 26.6  MCHC 31.9 32.5 34.7 33.9  33.6  RDW 17.4* 18.1* 18.7* 19.0* 19.4*  LYMPHSABS  --   --  0.3* 0.2* 0.3*  MONOABS  --   --  0.3 0.2 0.4  EOSABS  --   --  0.0 0.0 0.0  BASOSABS  --   --  0.0 0.0 0.0    Chemistries  Recent Labs  Lab 09/04/20 0131 09/05/20 0129 09/06/20 0428 09/07/20 0315 09/08/20 0307 09/09/20 0136  NA 140 140 139 140 135 139  K 4.0 4.4 4.3 4.7 5.3* 4.0  CL 105 107 103 103 102 103  CO2 26 22 23 24 23 24   GLUCOSE 66* 98 146* 182* 250* 111*  BUN 53* 48* 48* 49* 55* 59*  CREATININE 2.11* 2.12* 2.46* 2.48* 2.40* 2.46*  CALCIUM 9.6 9.6 9.9 10.0 9.9 9.8  MG  --   --   --  2.3 2.3  --   AST 56*  --  50* 56* 65* 110*  ALT 220*  --  176* 161* 151* 245*  ALKPHOS 111  --  156* 172* 185* 233*  BILITOT 1.0  --  1.3* 1.2 1.1 1.3*   ------------------------------------------------------------------------------------------------------------------ No results for input(s): CHOL, HDL, LDLCALC, TRIG, CHOLHDL, LDLDIRECT in the last 72 hours.  Lab Results  Component Value Date   HGBA1C 8.2 (H) 08/19/2020   ------------------------------------------------------------------------------------------------------------------ No results for input(s): TSH, T4TOTAL, T3FREE, THYROIDAB in the last 72 hours.  Invalid input(s): FREET3 ------------------------------------------------------------------------------------------------------------------ Recent Labs     09/08/20 1115 09/08/20 1116  VITAMINB12 686  --   FOLATE 5.6*  --   FERRITIN 3,647*  --   TIBC 295  --   IRON 96  --   RETICCTPCT  --  3.2*    Coagulation profile No results for input(s): INR, PROTIME in the last 168 hours.  Recent Labs    09/08/20 0307 09/09/20 0136  DDIMER 0.30 0.42    Cardiac Enzymes No results for input(s): CKMB, TROPONINI, MYOGLOBIN in the last 168 hours.  Invalid input(s): CK ------------------------------------------------------------------------------------------------------------------    Component Value Date/Time   BNP 113.1 (H) 09/09/2020 0136    Micro Results Recent Results (from the past 240 hour(s))  MRSA PCR Screening     Status: None   Collection Time: 09/07/20  9:37 AM   Specimen: Nasopharyngeal  Result Value Ref Range Status   MRSA by PCR NEGATIVE NEGATIVE Final    Comment:        The GeneXpert MRSA Assay (FDA approved for NASAL specimens only), is one component of a comprehensive MRSA colonization surveillance program. It is not intended to diagnose MRSA infection nor to guide or monitor treatment for MRSA infections. Performed at [facility], [place] [address] [place], [place] [address]     Radiology Reports CT CHEST WO CONTRAST  Result Date: 08/24/2020 CLINICAL DATA:  Severe hypoxia and respiratory failure with COVID-19 infection. EXAM: CT CHEST WITHOUT CONTRAST TECHNIQUE: Multidetector CT imaging of the chest was performed following the standard protocol without IV contrast. COMPARISON:  Chest x-ray 08/18/2020 FINDINGS: Cardiovascular: Mild cardiomegaly. Mild calcification over the mitral valve annulus. Thoracic aorta is normal caliber. Stent over the right subclavian/axillary region. Remaining vascular structures are unremarkable. Mediastinum/Nodes: No evidence of mediastinal or hilar adenopathy. Remaining mediastinal structures are unremarkable. Lungs/Pleura: Lungs are adequately inflated demonstrate moderate  bilateral hazy airspace process likely multifocal viral pneumonia in this COVID-19 positive patient. No evidence of effusion. Airways are otherwise unremarkable. Upper Abdomen: No acute findings. Musculoskeletal: Degenerative change of the spine. Mild anterior wedging of  a midthoracic vertebral body likely chronic. IMPRESSION: 1. Moderate bilateral hazy airspace process likely multifocal viral pneumonia in this COVID-19 positive patient. 2. Mild cardiomegaly. 3. Mild anterior wedging of a midthoracic vertebral body likely chronic. Electronically Signed   By: Marin Olp M.D.   On: 08/24/2020 12:37   US Renal Transplant w/Doppler  Result Date: 08/19/2020 CLINICAL DATA:  Acute kidney injury EXAM: ULTRASOUND OF RENAL TRANSPLANT WITH RENAL DOPPLER ULTRASOUND TECHNIQUE: Ultrasound examination of the renal transplant was performed with gray-scale, color and duplex doppler evaluation. COMPARISON:  None. FINDINGS: Transplant kidney location: Left lower quadrant Transplant Kidney: Renal measurements: 10.8 x 6.2 x 6.5 cm = volume: 25mL. Normal in size and parenchymal echogenicity. No evidence of mass or hydronephrosis. No peri-transplant fluid collection seen. Color flow in the main renal artery:  Yes Color flow in the main renal vein:  Yes Duplex Doppler Evaluation: Main Renal Artery Velocity:  cm/sec Main Renal Artery Resistive Index: 0.99 Venous waveform in main renal vein:  Present Intrarenal resistive index in upper pole:  0.98 (normal 0.6-0.8; equivocal 0.8-0.9; abnormal >= 0.9) Intrarenal resistive index in lower pole: 0.98 (normal 0.6-0.8; equivocal 0.8-0.9; abnormal >= 0.9) Bladder: Normal for degree of bladder distention. Other findings:  None. IMPRESSION: Elevated resistive indices within the left lower quadrant transplant kidney. No hydronephrosis. Electronically Signed   By: Rolm Baptise M.D.   On: 08/19/2020 19:15   DG Chest Port 1 View  Result Date: 09/07/2020 CLINICAL DATA:  Post renal transplant x  2 most recently 2016, chronic immunosuppressive therapy, type II diabetes mellitus, presents with shortness of breath, acute hypoxic respiratory failure due to COVID-19 pneumonia EXAM: PORTABLE CHEST 1 VIEW COMPARISON:  Portable exam 1005 hours compared to 09/05/2020 FINDINGS: Enlargement of cardiac silhouette. Mediastinal contours and pulmonary vascularity normal. Atherosclerotic calcification aorta. BILATERAL scattered mixed airspace interstitial infiltrates consistent with multifocal pneumonia and COVID-19. No pleural effusion or pneumothorax. Bones demineralized with stent identified at the RIGHT axillary vessels. IMPRESSION: Persistent diffuse BILATERAL pulmonary infiltrates consistent with multifocal pneumonia and COVID-19. Electronically Signed   By: Lavonia Dana M.D.   On: 09/07/2020 10:28   DG CHEST PORT 1 VIEW  Result Date: 08/31/2020 CLINICAL DATA:  65 year old female with shortness of breath. EXAM: PORTABLE CHEST 1 VIEW COMPARISON:  Chest radiograph dated 08/18/2020. FINDINGS: Bilateral confluent and reticular densities, progressed since the prior radiograph and most consistent with multifocal pneumonia. No pleural effusion pneumothorax. Borderline cardiomegaly. No acute osseous pathology. IMPRESSION: Interval progression of bilateral pulmonary opacities compared to the radiograph of 08/18/2020. Continued follow-up recommended. Electronically Signed   By: Anner Crete M.D.   On: 08/31/2020 21:17   DG Chest Portable 1 View  Result Date: 08/18/2020 CLINICAL DATA:  65 year old female with history of COVID infection. Chest pain. Shortness of breath. EXAM: PORTABLE CHEST 1 VIEW COMPARISON:  Chest x-ray 10/20/2014. FINDINGS: Patchy multifocal airspace consolidation and areas of interstitial prominence are noted throughout the lungs bilaterally, most severe throughout the periphery of the mid to lower left lung, indicative of multilobar bilateral pneumonia. No definite pleural effusions. No evidence  of pulmonary edema. No pneumothorax. Heart size is borderline enlarged. Upper mediastinal contours are within normal limits. Atherosclerotic calcifications in the thoracic aorta. Vascular stent projecting over the right subclavian region. IMPRESSION: 1. Severe multilobar bilateral (left greater than right) pneumonia compatible with reported COVID infection. 2. Aortic atherosclerosis. Electronically Signed   By: Vinnie Langton M.D.   On: 08/18/2020 18:02   DG Chest Port 1V same Day  Result  Date: 09/05/2020 CLINICAL DATA:  Shortness of breath, COVID positive EXAM: PORTABLE CHEST 1 VIEW COMPARISON:  08/31/2020 FINDINGS: Persistent diffuse bilateral pulmonary opacities. Aeration has decreased since the prior study. No significant pleural effusion. No pneumothorax. Stable cardiomediastinal contours. IMPRESSION: Progression of diffuse bilateral pulmonary opacities. Electronically Signed   By: Macy Mis M.D.   On: 09/05/2020 08:05   ECHOCARDIOGRAM LIMITED  Result Date: 09/02/2020    ECHOCARDIOGRAM LIMITED REPORT   Patient Name:   [facility] Date of Exam: 09/02/2020 Medical Rec #:  [id number]               Height:       62.0 in Accession #:    [id number]              Weight:       194.4 lb Date of Birth:  Oct 10, 1955              BSA:          1.889 m Patient Age:    82 years                BP:           135/66 mmHg Patient Gender: F                       HR:           100 bpm. Exam Location:  Inpatient Procedure: Limited Echo, Limited Color Doppler and Cardiac Doppler Indications:    chest pain  History:        Patient has prior history of Echocardiogram examinations, most                 recent 09/30/2006. Covid; Risk Factors:Diabetes and Hypertension.  Sonographer:    Johny Chess Referring Phys: [place]  1. Left ventricular ejection fraction, by estimation, is >75%. The left ventricle has hyperdynamic function. The left ventricle has no regional wall motion  abnormalities. There is moderate left ventricular hypertrophy. Left ventricular diastolic parameters are consistent with Grade I diastolic dysfunction (impaired relaxation).  2. Right ventricular systolic function is normal. The right ventricular size is normal.  3. The mitral valve is normal in structure. No evidence of mitral valve regurgitation. No evidence of mitral stenosis. Moderate mitral annular calcification.  4. The aortic valve is tricuspid. Aortic valve regurgitation is not visualized. Mild aortic valve sclerosis is present, with no evidence of aortic valve stenosis.  5. The inferior vena cava is normal in size with greater than 50% respiratory variability, suggesting right atrial pressure of 3 mmHg. FINDINGS  Left Ventricle: Left ventricular ejection fraction, by estimation, is >75%. The left ventricle has hyperdynamic function. The left ventricle has no regional wall motion abnormalities. The left ventricular internal cavity size was normal in size. There is moderate left ventricular hypertrophy. Left ventricular diastolic parameters are consistent with Grade I diastolic dysfunction (impaired relaxation). Right Ventricle: The right ventricular size is normal. Right ventricular systolic function is normal. Left Atrium: Left atrial size was normal in size. Right Atrium: Right atrial size was normal in size. Pericardium: There is no evidence of pericardial effusion. Mitral Valve: The mitral valve is normal in structure. Moderate mitral annular calcification. No evidence of mitral valve stenosis. Tricuspid Valve: The tricuspid valve is normal in structure. Tricuspid valve regurgitation is trivial. No evidence of tricuspid stenosis. Aortic Valve: The aortic valve is tricuspid. Aortic valve regurgitation is not visualized. Mild  aortic valve sclerosis is present, with no evidence of aortic valve stenosis. Pulmonic Valve: The pulmonic valve was normal in structure. Pulmonic valve regurgitation is not  visualized. No evidence of pulmonic stenosis. Aorta: The aortic root is normal in size and structure. Venous: The inferior vena cava is normal in size with greater than 50% respiratory variability, suggesting right atrial pressure of 3 mmHg. IAS/Shunts: No atrial level shunt detected by color flow Doppler. LEFT VENTRICLE PLAX 2D LVIDd:         3.70 cm  Diastology LVIDs:         2.50 cm  LV e' medial:    5.33 cm/s LV PW:         1.00 cm  LV E/e' medial:  13.8 LV IVS:        0.90 cm  LV e' lateral:   5.66 cm/s LVOT diam:     1.70 cm  LV E/e' lateral: 13.0 LV SV:         59 LV SV Index:   31 LVOT Area:     2.27 cm  IVC IVC diam: 1.30 cm LEFT ATRIUM         Index LA diam:    3.90 cm 2.06 cm/m  AORTIC VALVE LVOT Vmax:   170.00 cm/s LVOT Vmean:  114.000 cm/s LVOT VTI:    0.258 m  AORTA Ao Asc diam: 2.60 cm MV E velocity: 73.50 cm/s MV A velocity: 121.00 cm/s  SHUNTS MV E/A ratio:  0.61         Systemic VTI:  0.26 m                             Systemic Diam: 1.70 cm Kirk Ruths MD Electronically signed by Kirk Ruths MD Signature Date/Time: 09/02/2020/12:26:37 PM    Final

## 2020-09-09 NOTE — Progress Notes (Signed)
[facility] Progress Note   65 yo F with hx of DDRT x2 (latest 2016) on prograf, myfortic, and low-dose prednisone, T2DM, hypothyroidism, and recent diagnosis of COVID-19 presenting with acute hypoxic respiratory failure 2/2 COVID PNA. Also found to be in DKA with worsening AoCKD and hyperkalemia on labs.  Electrolyte disturbances improved.  Patient is slowly improving but creatinine remains consistently over 2  Assessment:    1. AKI on CKD;  Cr presentation 4, nadir 1.9 1/17 to 1/24 then uptrending to 2.46 1/29 currently stable and no indications for dialysis  2. H/O DDKT x 2 (latest 2016). Baseline Cr ?1.7 but probably around 1.5-2.Follows w/ Dr. Marval Regal ([facility]) & [facility]-  Needing to hold prograf and cellcept for COVID with worsening status 3. Chronic immunosuppressionTac/MMF/Pred- as above - prograf and cellcept on hold 4. Hypoxic respiratory faiolure secondary COVID-19 PNA; acutely worsened 1/28 and broad spectrum antibiotics added.  Continued hypoxia 5. DKA, h/o uncontrolled DM2w/ hyperglycemia, resolved 6. Hyperkalemia, intermittent. Given a dose of kaexylate yesterday-  No labs today but ordered for tomorrow  7. Anion Gap Metabolic acidosis, resolved/stable on NaHCO3 8. HTN-  Doesn't appear to be a volume overload primary issue 9. Anemia- hgb 7-   iron stores OK - give ESA   Plan: -Pilar Plate discussion  re: status and recommended d/c prograf- done on 1/30.  She understands there is a risk for rejection but in light of worsening pulmonary status it's necessary.  -Continue monitor creatinine daily - holding MMF as well   Dispo - tenuous; code status changed to DNR with plans for comfort care if worse but maybe turing the corner ???.  Subjective:    Continues to require high levels of O2 -  She says she maybe feels a little better-  Up in bedside chair   UOP not well documented    Objective:   BP 129/65 (BP Location: Right Arm)   Pulse 100    Temp 98.9 F (37.2 C) (Oral)   Resp (!) 24   Ht 5\' 2"  (1.575 m)   Wt 86.2 kg   SpO2 100%   BMI 34.76 kg/m   Intake/Output Summary (Last 24 hours) at 09/09/2020 1343 Last data filed at 09/09/2020 7106 Gross per 24 hour  Intake 300 ml  Output 150 ml  Net 150 ml   Weight change: -1.9 kg  Physical Exam:  GEN: NAD, NCAT HEENT: No conjunctival pallor, EOMI NECK: Supple, no thyromegaly LUNGS: CBS, can speak in full sentences; no rales CV: normal rate, no audible murmur; hyperdynamic ABD: SNDNT +BS  EXT: No lower extremity edema, warm and well perfused ACCESS: rt BCF aneurysmal pulsatile   Imaging: No results found.  Labs: BMET Recent Labs  Lab 09/03/20 0059 09/04/20 0131 09/05/20 0129 09/06/20 0428 09/07/20 0315 09/08/20 0307 09/09/20 0136  NA 141 140 140 139 140 135 139  K 4.4 4.0 4.4 4.3 4.7 5.3* 4.0  CL 106 105 107 103 103 102 103  CO2 25 26 22 23 24 23 24   GLUCOSE 143* 66* 98 146* 182* 250* 111*  BUN 61* 53* 48* 48* 49* 55* 59*  CREATININE 2.04* 2.11* 2.12* 2.46* 2.48* 2.40* 2.46*  CALCIUM 9.7 9.6 9.6 9.9 10.0 9.9 9.8  PHOS  --   --  3.4 5.5*  --   --   --    CBC Recent Labs  Lab 09/06/20 0428 09/07/20 0315 09/08/20 0307 09/09/20 0136  WBC 14.1* 15.8* 14.1* 15.4*  NEUTROABS  --  15.0*  13.5* 14.5*  HGB 8.3* 8.3* 7.4* 7.1*  HCT 25.5* 23.9* 21.8* 21.1*  MCV 78.9* 78.1* 78.1* 79.0*  PLT 120* 119* 101* 99*    Medications:    . amLODipine  10 mg Oral Daily  . vitamin C  500 mg Oral Daily  . aspirin EC  81 mg Oral Daily  . enoxaparin (LOVENOX) injection  30 mg Subcutaneous Q24H  . feeding supplement (NEPRO CARB STEADY)  237 mL Oral BID BM  . fluticasone  1 spray Each Nare Daily  . hydrALAZINE  25 mg Oral Q8H  . insulin aspart  0-15 Units Subcutaneous TID WC  . insulin aspart  4 Units Subcutaneous TID WC  . insulin detemir  14 Units Subcutaneous q morning - 10a  . insulin detemir  7 Units Subcutaneous QHS  . isosorbide mononitrate  60 mg Oral Daily   . levothyroxine  50 mcg Oral Q0600  . LORazepam  1 mg Oral BID  . mouth rinse  15 mL Mouth Rinse BID  . methylPREDNISolone (SOLU-MEDROL) injection  40 mg Intravenous Daily  . pantoprazole  40 mg Oral BID  . zinc sulfate  220 mg Oral Daily    Louis Meckel, MD  09/09/2020, 1:43 PM

## 2020-09-10 ENCOUNTER — Inpatient Hospital Stay (HOSPITAL_COMMUNITY): Payer: HMO

## 2020-09-10 DIAGNOSIS — J9601 Acute respiratory failure with hypoxia: Secondary | ICD-10-CM | POA: Diagnosis not present

## 2020-09-10 DIAGNOSIS — U071 COVID-19: Secondary | ICD-10-CM | POA: Diagnosis not present

## 2020-09-10 LAB — CBC WITH DIFFERENTIAL/PLATELET
Abs Immature Granulocytes: 0.1 10*3/uL — ABNORMAL HIGH (ref 0.00–0.07)
Basophils Absolute: 0 10*3/uL (ref 0.0–0.1)
Basophils Relative: 0 %
Eosinophils Absolute: 0 10*3/uL (ref 0.0–0.5)
Eosinophils Relative: 0 %
HCT: 20.9 % — ABNORMAL LOW (ref 36.0–46.0)
Hemoglobin: 7.2 g/dL — ABNORMAL LOW (ref 12.0–15.0)
Immature Granulocytes: 1 %
Lymphocytes Relative: 2 %
Lymphs Abs: 0.2 10*3/uL — ABNORMAL LOW (ref 0.7–4.0)
MCH: 27.6 pg (ref 26.0–34.0)
MCHC: 34.4 g/dL (ref 30.0–36.0)
MCV: 80.1 fL (ref 80.0–100.0)
Monocytes Absolute: 0.3 10*3/uL (ref 0.1–1.0)
Monocytes Relative: 3 %
Neutro Abs: 9.4 10*3/uL — ABNORMAL HIGH (ref 1.7–7.7)
Neutrophils Relative %: 94 %
Platelets: 92 10*3/uL — ABNORMAL LOW (ref 150–400)
RBC: 2.61 MIL/uL — ABNORMAL LOW (ref 3.87–5.11)
RDW: 20.5 % — ABNORMAL HIGH (ref 11.5–15.5)
WBC: 10 10*3/uL (ref 4.0–10.5)
nRBC: 0.2 % (ref 0.0–0.2)

## 2020-09-10 LAB — COMPREHENSIVE METABOLIC PANEL
ALT: 181 U/L — ABNORMAL HIGH (ref 0–44)
AST: 55 U/L — ABNORMAL HIGH (ref 15–41)
Albumin: 2.9 g/dL — ABNORMAL LOW (ref 3.5–5.0)
Alkaline Phosphatase: 215 U/L — ABNORMAL HIGH (ref 38–126)
Anion gap: 10 (ref 5–15)
BUN: 50 mg/dL — ABNORMAL HIGH (ref 8–23)
CO2: 23 mmol/L (ref 22–32)
Calcium: 10 mg/dL (ref 8.9–10.3)
Chloride: 105 mmol/L (ref 98–111)
Creatinine, Ser: 2.15 mg/dL — ABNORMAL HIGH (ref 0.44–1.00)
GFR, Estimated: 25 mL/min — ABNORMAL LOW (ref >60)
Glucose, Bld: 81 mg/dL (ref 70–99)
Potassium: 4.3 mmol/L (ref 3.5–5.1)
Sodium: 138 mmol/L (ref 135–145)
Total Bilirubin: 1.1 mg/dL (ref 0.3–1.2)
Total Protein: 5.6 g/dL — ABNORMAL LOW (ref 6.5–8.1)

## 2020-09-10 LAB — GLUCOSE, CAPILLARY
Glucose-Capillary: 81 mg/dL (ref 70–99)
Glucose-Capillary: 187 mg/dL — ABNORMAL HIGH (ref 70–99)
Glucose-Capillary: 336 mg/dL — ABNORMAL HIGH (ref 70–99)
Glucose-Capillary: 272 mg/dL — ABNORMAL HIGH (ref 70–99)

## 2020-09-10 LAB — BRAIN NATRIURETIC PEPTIDE: B Natriuretic Peptide: 91.9 pg/mL (ref 0.0–100.0)

## 2020-09-10 LAB — C-REACTIVE PROTEIN: CRP: 0.6 mg/dL (ref <1.0)

## 2020-09-10 LAB — PROCALCITONIN: Procalcitonin: <0.1 ng/mL

## 2020-09-10 LAB — D-DIMER, QUANTITATIVE: D-Dimer, Quant: 0.37 ug/mL-FEU (ref 0.00–0.50)

## 2020-09-10 LAB — PROTIME-INR
INR: 1.2 (ref 0.8–1.2)
Prothrombin Time: 14.3 seconds (ref 11.4–15.2)

## 2020-09-10 MED ORDER — INSULIN DETEMIR 100 UNIT/ML ~~LOC~~ SOLN: 10.0000 [IU] | Freq: Every morning | SUBCUTANEOUS | Status: DC

## 2020-09-10 MED ORDER — INSULIN DETEMIR 100 UNIT/ML ~~LOC~~ SOLN
8.0000 [IU] | Freq: Two times a day (BID) | SUBCUTANEOUS | Status: DC
  Administered 2020-09-10 – 2020-09-22 (×26): 8 [IU] via SUBCUTANEOUS
  Filled 2020-09-10 (×28): qty 0.08

## 2020-09-10 NOTE — Progress Notes (Signed)
[name] Progress Note   65 yo F with hx of DDRT x2 (latest 2016) on prograf, myfortic, and low-dose prednisone, T2DM, hypothyroidism, and recent diagnosis of COVID-19 presenting with acute hypoxic respiratory failure 2/2 COVID PNA. Also found to be in DKA with worsening AoCKD and hyperkalemia on labs.  Electrolyte disturbances improved.  Patient is slowly improving but creatinine remains consistently over 2  Assessment:    1. AKI on CKD;  Cr presentation 4, nadir 1.9 1/17 to 1/24 then uptrending to 2.46 1/29 currently stable to improved ?  and no indications for dialysis  2. H/O DDKT x 2 (latest 2016). Baseline Cr ?1.7 but probably around 1.5-2.Follows w/ Dr. Marval Regal ([facility]) & [facility]-  Needing to hold prograf and cellcept for COVID with worsening status.  Still req high flow O2 and dose even inc so need to continue to hold these meds  3. Chronic immunosuppressionTac/MMF/Pred- as above - prograf and cellcept on hold 4. Hypoxic respiratory faiolure secondary COVID-19 PNA; acutely worsened 1/28 and broad spectrum antibiotics added- then stopped.  Continued hypoxia 5. DKA, h/o uncontrolled DM2w/ hyperglycemia, resolved 6. Hyperkalemia, intermittent.   No treatment needed today  7. Anion Gap Metabolic acidosis, resolved/stable on NaHCO3 8. HTN-  Doesn't appear to be a volume overload primary issue 9. Anemia- hgb 7-   iron stores OK - give ESA - transfuse for less than 7   Plan: Pilar Plate discussion  re: status and recommended d/c prograf- done on 1/30.  She understands there is a risk for rejection but in light of worsening pulmonary status it's necessary.  -Continue monitor creatinine daily - holding MMF as well   Dispo - tenuous; code status changed to DNR with plans for comfort care if worse but maybe turning the corner ???.  Subjective:    Continues to require high levels of O2 -  Notes from other providers reviewed    Objective:   BP 120/62 (BP  Location: Left Arm)   Pulse (!) 112   Temp 98.1 F (36.7 C) (Axillary)   Resp 20   Ht 5\' 2"  (1.575 m)   Wt 86.2 kg   SpO2 90%   BMI 34.76 kg/m   Intake/Output Summary (Last 24 hours) at 09/10/2020 1410 Last data filed at 09/10/2020 7619 Gross per 24 hour  Intake 240 ml  Output 275 ml  Net -35 ml   Weight change:   Physical Exam: not examined today, this exam is from 2/1 GEN: NAD, NCAT HEENT: No conjunctival pallor, EOMI NECK: Supple, no thyromegaly LUNGS: CBS, can speak in full sentences; no rales CV: normal rate, no audible murmur; hyperdynamic ABD: SNDNT +BS  EXT: No lower extremity edema, warm and well perfused ACCESS: rt BCF aneurysmal pulsatile   Imaging: DG Chest Port 1 View  Result Date: 09/10/2020 CLINICAL DATA:  COVID positive EXAM: PORTABLE CHEST 1 VIEW COMPARISON:  09/07/2020 FINDINGS: Diffuse bilateral airspace disease unchanged. No significant pleural effusion. No pneumothorax. Cardiac enlargement.  Right axillary stent. IMPRESSION: Diffuse bilateral airspace disease unchanged from the prior study. Electronically Signed   By: Franchot Gallo M.D.   On: 09/10/2020 09:10   US Abdomen Limited RUQ (LIVER/GB)  Result Date: 09/09/2020 CLINICAL DATA:  Transaminitis EXAM: ULTRASOUND ABDOMEN LIMITED RIGHT UPPER QUADRANT COMPARISON:  None. FINDINGS: Gallbladder: Gallbladder contains some echogenic biliary sludge as well as several slightly more echogenic foci which could reflect some small admixed gallstones (8/33). Borderline gallbladder wall thickening at 4 mm. No pericholecystic fluid or inflammation. Sonographic Percell Miller  sign was reportedly negative by sonographer. Common bile duct: Diameter: Poorly visualized. Liver: No focal lesion identified. Within normal limits in parenchymal echogenicity. No intrahepatic biliary ductal dilatation. Challenging visualization of the portal vein. Included portions of the portal vein is patent on color Doppler imaging with normal direction of  blood flow towards the liver. Other: COVID positive patient with a technically challenging exam due to a seated positioning and inability to perform decubitus positioning. IMPRESSION: 1. Technically challenging exam due to patient's inability to perform decubitus positioning. 2. Biliary sludge with possible admixed gallstones and borderline gallbladder wall thickening albeit with a negative sonographic Murphy sign absent pericholecystic fluid. Findings are equivocal for acute cholecystitis though should be considered in the appropriate clinical context. If there is persisting clinical concern, HIDA could be obtained. 3. No intrahepatic biliary ductal dilatation. Challenging visualization of the common bile duct. Electronically Signed   By: Lovena Le M.D.   On: 09/09/2020 21:41    Labs: BMET Recent Labs  Lab 09/04/20 0131 09/05/20 0129 09/06/20 0428 09/07/20 0315 09/08/20 0307 09/09/20 0136 09/10/20 0318  NA 140 140 139 140 135 139 138  K 4.0 4.4 4.3 4.7 5.3* 4.0 4.3  CL 105 107 103 103 102 103 105  CO2 26 22 23 24 23 24 23   GLUCOSE 66* 98 146* 182* 250* 111* 81  BUN 53* 48* 48* 49* 55* 59* 50*  CREATININE 2.11* 2.12* 2.46* 2.48* 2.40* 2.46* 2.15*  CALCIUM 9.6 9.6 9.9 10.0 9.9 9.8 10.0  PHOS  --  3.4 5.5*  --   --   --   --    CBC Recent Labs  Lab 09/07/20 0315 09/08/20 0307 09/09/20 0136 09/10/20 0318  WBC 15.8* 14.1* 15.4* 10.0  NEUTROABS 15.0* 13.5* 14.5* 9.4*  HGB 8.3* 7.4* 7.1* 7.2*  HCT 23.9* 21.8* 21.1* 20.9*  MCV 78.1* 78.1* 79.0* 80.1  PLT 119* 101* 99* 92*    Medications:    . amLODipine  10 mg Oral Daily  . vitamin C  500 mg Oral Daily  . aspirin EC  81 mg Oral Daily  . darbepoetin (ARANESP) injection - NON-DIALYSIS  150 mcg Subcutaneous Q Tue-1800  . enoxaparin (LOVENOX) injection  30 mg Subcutaneous Q24H  . feeding supplement (NEPRO CARB STEADY)  237 mL Oral BID BM  . fluticasone  1 spray Each Nare Daily  . hydrALAZINE  25 mg Oral Q8H  . insulin aspart   0-15 Units Subcutaneous TID WC  . insulin detemir  8 Units Subcutaneous BID  . isosorbide mononitrate  60 mg Oral Daily  . levothyroxine  50 mcg Oral Q0600  . LORazepam  1 mg Oral BID  . mouth rinse  15 mL Mouth Rinse BID  . methylPREDNISolone (SOLU-MEDROL) injection  40 mg Intravenous Daily  . pantoprazole  40 mg Oral BID  . zinc sulfate  220 mg Oral Daily    Louis Meckel, MD  09/10/2020, 2:10 PM

## 2020-09-10 NOTE — Progress Notes (Signed)
Floor coverage overnight event  Notified by RN that patient's vital signs are: Temperature 98.3 F, heart rate 114 (tachycardic in the daytime as well), blood pressure 120/86, respiratory rate in the mid twenties to low thirties (tachypneic in the daytime as well), satting 96% on 30 L heated high flow nasal cannula since the beginning of her shift with no change in oxygen requirement. Per RN, patient reports feeling unwell but has no other specific complaints. Not complaining of chest pain. No wheezing or increased work of breathing/accessory muscle use per Therapist, sports. At rest, respiratory rate in the mid twenties at present.  Chart reviewed, patient admitted for acute hypoxic respiratory failure due to COVID-19 viral pneumonia and AKI on CKD stage IV with history of transplanted kidney. She is currently on Solu-Medrol. Already finished courses of remdesivir, baricitinib, and antibiotics. Chest x-ray done earlier today showing diffuse bilateral airspace disease unchanged from prior study done 1/30. Will continue current management at this time. As stated in daytime MDs note, plan is to initiate full comfort care measures if there is significant clinical deterioration. Daytime MD had discussed this with the patient's daughter on 1/29.

## 2020-09-10 NOTE — Telephone Encounter (Signed)
Patient currently admitted

## 2020-09-10 NOTE — Progress Notes (Signed)
   09/10/20 1218  Assess: MEWS Score  Temp 98.1 F (36.7 C)  BP 120/62  Pulse Rate (!) 112  ECG Heart Rate (!) 111  Resp 20  SpO2 90 %  Assess: MEWS Score  MEWS Temp 0  MEWS Systolic 0  MEWS Pulse 2  MEWS RR 0  MEWS LOC 0  MEWS Score 2  MEWS Score Color Yellow  Assess: if the MEWS score is Yellow or Red  Were vital signs taken at a resting state? Yes  Focused Assessment No change from prior assessment  Early Detection of Sepsis Score *See Row Information* Low  MEWS guidelines implemented *See Row Information* Yes  Treat  MEWS Interventions Escalated (See documentation below)  Pain Scale 0-10  Pain Score 0  Take Vital Signs  Increase Vital Sign Frequency  Yellow: Q 2hr X 2 then Q 4hr X 2, if remains yellow, continue Q 4hrs  Escalate  MEWS: Escalate Yellow: discuss with charge nurse/RN and consider discussing with provider and RRT  Notify: Charge Nurse/RN  Name of Charge Nurse/RN Notified Erin RN  Date Charge Nurse/RN Notified 09/10/20  Time Charge Nurse/RN Notified 1336

## 2020-09-10 NOTE — Progress Notes (Signed)
PROGRESS NOTE                                                                                                                                                                                                             Patient Demographics:    [name] [name], is a 65 y.o. female, DOB - Mar 20, 1956, JOI:[id number]  Outpatient Primary MD for the patient is Glendale Chard, MD   Admit date - 08/18/2020   LOS - 30  Chief Complaint  Patient presents with  . Covid Positive  . Chest Pain  . Weakness       Brief Narrative: Patient is a 65 y.o. female with PMHx of renal transplant x2 (latest in 2016) on chronic immunosuppressive agents, DM-2, hypothyroidism-who presented with shortness of breath-found to have acute hypoxic respiratory failure due to COVID-19 pneumonia.  Hospital course complicated by slow improvement from severe hypoxemia, AKI and non-STEMI.  COVID-19 vaccinated status: Vaccinated including booster  Significant Events: 1/10>> Admit to [facility] for acute hypoxic respiratory failure due to COVID-19 pneumonia 1/11>> transfer to ICU due to worsening hypoxemia, AKI/metabolic acidosis/hyperkalemia. 1/14>> transferred to [facility] 1/23>> chest pain-non-STEMI-cardiology consult 1/25>> chest pain-resolved by SL nitroglycerin x1 1/28>> tachypneic- worsening hypoxemia-now on 15 L via NRB and 15 L via HFNC  Significant studies: 1/10>> chest x-ray: Severe multilobar bilateral pneumonia. 1/11>> ultrasound renal transplant: No hydronephrosis 1/16>> CT chest: Multifocal viral pneumonia 1/26>>Echo: EF> 70%, LVH, grade 1 diastolic dysfunction 4/70>> chest x-ray: Progression of diffuse bilateral pulmonary opacitie   COVID-19 medications: Steroids: 1/10>> Remdesivir: 1/10>>1/15 Baricitinib: 1/16>>  Antibiotics: Rocephin: 1/12>> 1/18 Cefepime: 1/28>> Vancomycin: 1/28>>  Microbiology data: 1/10 >>blood culture: No  growth 1/11>> urine culture: Klebsiella pneumoniae  Procedures: None  Consults: PCCM, nephrology, cardiology  DVT prophylaxis: enoxaparin (LOVENOX) injection 30 mg Start: 09/03/20 1400 SCDs Start: 08/19/20 0933    Subjective:   Patient in bed, appears comfortable, denies any headache, no fever, no chest pain or pressure, no shortness of breath , no abdominal pain. No focal weakness.   Assessment  & Plan :   Acute Hypoxic Resp Failure due to Covid 19 Viral pneumonia: she has been fully vaccinated however she was immunocompromised due to her underlying history of renal transplant and being on immunosuppressive medications.  She developed severe parenchymal lung injury due to COVID-19 pneumonia/ARDS, has been treated with IV steroids,  Baricitinib and Remdesivir combination along with as needed Lasix.  She has also been given a trial of IV antibiotics for any superimposed bacterial component to her pneumonia  Despite aggressive measures she remains severely hypoxic, detailed discussion between treatment team and family has been done by previous MD and me.  Goal of care is gentle medical treatment to continue, if significant decline or worsening focus on comfort measures.   Although she has leukocytosis I think this is largely due to steroids, her procalcitonin has been undetectable, I will at this time stop antibiotics on 09/07/2020, no productive cough or focal infiltrate on x-ray.  Will continue Solu-Medrol taper, she has finished Remdesivir and Baricitinib.    Continue heated high flow with supplemental nonrebreather mask upon increase activity and oxygen demand, will try and keep her on 1 source if possible for majority of the time to know exactly what her oxygen requirements are.  If any clinical worsening or discomfort full comfort care will be initiated, I have discussed this with daughter personally on 09/06/2020.  Prone/Incentive Spirometry: encouraged  incentive spirometry use  3-4/hour.  O2 requirements:   SpO2: (!) 89 % O2 Flow Rate (L/min): 25 L/min FiO2 (%): 60 %     Recent Labs  Lab 09/05/20 0129 09/05/20 0821 09/05/20 1110 09/06/20 0428 09/07/20 0315 09/08/20 0307 09/09/20 0136 09/10/20 0318  WBC 12.0*  --   --  14.1* 15.8* 14.1* 15.4* 10.0  HGB 8.6*  --   --  8.3* 8.3* 7.4* 7.1* 7.2*  HCT 27.0*  --   --  25.5* 23.9* 21.8* 21.1* 20.9*  PLT 105*  --   --  120* 119* 101* 99* 92*  CRP  --    < >  --  <0.5 0.6 0.7 0.6 0.6  BNP 107.0*  --   --   --  191.7* 136.7* 113.1* 91.9  DDIMER 0.40  --   --  0.43 0.32 0.30 0.42 0.37  PROCALCITON  --   --  <0.10  --  <0.10 <0.10 0.13 <0.10  AST  --   --   --  50* 56* 65* 110* 55*  ALT  --   --   --  176* 161* 151* 245* 181*  ALKPHOS  --   --   --  156* 172* 185* 233* 215*  BILITOT  --   --   --  1.3* 1.2 1.1 1.3* 1.1  ALBUMIN 3.0*  --   --  3.1*  3.1* 3.0* 2.9* 2.8* 2.9*  INR  --   --   --   --   --   --   --  1.2   < > = values in this interval not displayed.     Sinus pause: Occurred on 1/20-approximately 4.25 seconds-this occurred while she was sleeping-after discussion with cardiology-beta-blocker discontinued.  Needs outpatient polysomnography.  Non-STEMI: Developed severe chest pain on 1/23 with EKG changes-relieved with nitroglycerin and morphine infusion-chest pain reoccurred on 1/25.  Echo is reassuring with preserved EF.  On aspirin/statin-has completed the 48 hours of IV heparin.  Cardiology has signed off.  AKI on CKD stage IV-transplanted kidney (second renal transplant-01/2015): AKI hemodynamically mediated-creatinine has overall improved-and plateaued to around 1.9 range-but lately has started to trend up. Nephrology reconsulted-Myfortic remains on hold-on Prograf and steroids-Prograf levels pending.  Nephrology following, has developed some crackles on 09/08/2020 with hyperkalemia, will give low-dose Lasix x1 oral on 09/08/2020.  Hyperkalemia/anion gap metabolic acidosis: Secondary to AKI-has  resolved.  Transaminitis:  LFTs have spiked a bit on 09/09/2020, hold statin, right upper quadrant ultrasound is nonacute with some sludge and gallstones but no signs of Murphy sign or BD obstruction, negative acute hepatitis panel.  Patient is asymptomatic, LFT trend is now coming down we will continue to monitor.    Anemia: Combination of anemia of chronic disease and critical illness.  Transfuse if hemoglobin drops below 7, no signs of active bleeding, unremarkable anemia panel.  Thrombocytopenia: Mild-stable-gradually improving-watch closely.   Complicated UTI: Completed a course of Rocephin.  Afebrile and nontoxic-appearing.  DKA: Resolved-no longer on insulin drip.  Hypothyroidism: Continue levothyroxine-given suppressed TSH-I have decreased dosage to 50 mcg-repeat TSH in 3 months.  HTN: BP stable-continue manidipine and hydralazine.   HLD: Continue statin.  Obesity: BMI of 34 follow with PCP post discharge for weight loss.   DM-2 (A1c 8.2 on 1/11) with uncontrolled hyperglycemia due to steroids: Levemir and sliding scale dose adjusted on 09/10/2020 with rapidly decreasing steroids.   Lab Results  Component Value Date   HGBA1C 8.2 (H) 08/19/2020   CBG (last 3)  Recent Labs    09/09/20 1716 09/09/20 2048 09/10/20 0742  GLUCAP 165* 122* 81    RN pressure injury documentation: Pressure Injury 08/20/20 Coccyx Mid Stage 1 -  Intact skin with non-blanchable redness of a localized area usually over a bony prominence. (Active)  08/20/20 0800  Location: Coccyx  Location Orientation: Mid  Staging: Stage 1 -  Intact skin with non-blanchable redness of a localized area usually over a bony prominence.  Wound Description (Comments):   Present on Admission: Yes    GI prophylaxis: PPI  Condition - Extremely Guarded  Family Communication  :  Daughter Mendel Ryder [phone] updated on 09/04/20, 09/06/20, 09/09/2020  Code Status :  Full Code  Diet :  Diet Order            Diet Carb  Modified Fluid consistency: Thin; Room service appropriate? Yes  Diet effective now                  Disposition Plan  :   Status is: Inpatient  Remains inpatient appropriate because:Inpatient level of care appropriate due to severity of illness   Dispo: The patient is from: Home              Anticipated d/c is to: SNF              Anticipated d/c date is: > 3 days              Patient currently is not medically stable to d/c.   Barriers to discharge: Worsening hypoxia requiring O2 supplementation  Antimicorbials  :    Anti-infectives (From admission, onward)   Start     Dose/Rate Route Frequency Ordered Stop   09/07/20 1030  vancomycin (VANCOCIN) IVPB 1000 mg/200 mL premix  Status:  Discontinued        1,000 mg 200 mL/hr over 60 Minutes Intravenous Every 48 hours 09/05/20 0933 09/07/20 0951   09/05/20 1030  vancomycin (VANCOREADY) IVPB 2000 mg/400 mL        2,000 mg 200 mL/hr over 120 Minutes Intravenous  Once 09/05/20 0933 09/05/20 2018   09/05/20 1030  ceFEPIme (MAXIPIME) 2 g in sodium chloride 0.9 % 100 mL IVPB  Status:  Discontinued        2 g 200 mL/hr over 30 Minutes Intravenous Every 24 hours 09/05/20 0933 09/07/20 0951   08/20/20 1445  cefTRIAXone (ROCEPHIN) 1 g  in sodium chloride 0.9 % 100 mL IVPB        1 g 200 mL/hr over 30 Minutes Intravenous Every 24 hours 08/20/20 1348 08/26/20 2147   08/20/20 1000  remdesivir 100 mg in sodium chloride 0.9 % 100 mL IVPB       "Followed by" Linked Group Details   100 mg 200 mL/hr over 30 Minutes Intravenous Daily 08/19/20 0021 08/23/20 1113   08/19/20 0130  remdesivir 200 mg in sodium chloride 0.9% 250 mL IVPB       "Followed by" Linked Group Details   200 mg 580 mL/hr over 30 Minutes Intravenous Once 08/19/20 0021 08/19/20 0314      Inpatient Medications  Scheduled Meds: . amLODipine  10 mg Oral Daily  . vitamin C  500 mg Oral Daily  . aspirin EC  81 mg Oral Daily  . darbepoetin (ARANESP) injection - NON-DIALYSIS   150 mcg Subcutaneous Q Tue-1800  . enoxaparin (LOVENOX) injection  30 mg Subcutaneous Q24H  . feeding supplement (NEPRO CARB STEADY)  237 mL Oral BID BM  . fluticasone  1 spray Each Nare Daily  . hydrALAZINE  25 mg Oral Q8H  . insulin aspart  0-15 Units Subcutaneous TID WC  . insulin detemir  8 Units Subcutaneous BID  . isosorbide mononitrate  60 mg Oral Daily  . levothyroxine  50 mcg Oral Q0600  . LORazepam  1 mg Oral BID  . mouth rinse  15 mL Mouth Rinse BID  . methylPREDNISolone (SOLU-MEDROL) injection  40 mg Intravenous Daily  . pantoprazole  40 mg Oral BID  . zinc sulfate  220 mg Oral Daily   Continuous Infusions: . sodium chloride    . sodium chloride Stopped (08/31/20 1545)   PRN Meds:.sodium chloride, acetaminophen **OR** acetaminophen, albuterol, guaiFENesin, hydrALAZINE, loperamide, ondansetron (ZOFRAN) IV, polyethylene glycol, sodium chloride   Time Spent in minutes  45   The patient is critically ill with multiple organ system failure and requires high complexity decision making for assessment and support, frequent evaluation and titration of therapies, advanced monitoring, review of radiographic studies and interpretation of complex data.    See all Orders from today for further details   Lala Lund M.D on 09/10/2020 at 8:32 AM  To page go to [email or web address] - use universal password  [facility] -  Office  [phone]    Objective:   Vitals:   09/10/20 0256 09/10/20 0427 09/10/20 0632 09/10/20 0705  BP: 120/67  131/66 130/66  Pulse: 93 99  (!) 115  Resp: (!) 24 (!) 33  (!) 25  Temp: 98 F (36.7 C)   97.6 F (36.4 C)  TempSrc: Axillary   Axillary  SpO2: 100% 99%  (!) 89%  Weight:      Height:        Wt Readings from Last 3 Encounters:  09/09/20 86.2 kg  05/14/20 92.5 kg  05/14/20 92.9 kg     Intake/Output Summary (Last 24 hours) at 09/10/2020 0832 Last data filed at 09/09/2020 1804 Gross per 24 hour  Intake 120 ml  Output 275 ml  Net  -155 ml     Physical Exam  Awake Alert, No new F.N deficits, Normal affect [place].AT,PERRAL Supple Neck,No JVD, No cervical lymphadenopathy appriciated.  Symmetrical Chest wall movement, Good air movement bilaterally, CTAB RRR,No Gallops, Rubs or new Murmurs, No Parasternal Heave +ve B.Sounds, Abd Soft, No tenderness, No organomegaly appriciated, No rebound - guarding or rigidity. No Cyanosis, Clubbing or edema, No new  Rash or bruise     Data Review:    CBC Recent Labs  Lab 09/06/20 0428 09/07/20 0315 09/08/20 0307 09/09/20 0136 09/10/20 0318  WBC 14.1* 15.8* 14.1* 15.4* 10.0  HGB 8.3* 8.3* 7.4* 7.1* 7.2*  HCT 25.5* 23.9* 21.8* 21.1* 20.9*  PLT 120* 119* 101* 99* 92*  MCV 78.9* 78.1* 78.1* 79.0* 80.1  MCH 25.7* 27.1 26.5 26.6 27.6  MCHC 32.5 34.7 33.9 33.6 34.4  RDW 18.1* 18.7* 19.0* 19.4* 20.5*  LYMPHSABS  --  0.3* 0.2* 0.3* 0.2*  MONOABS  --  0.3 0.2 0.4 0.3  EOSABS  --  0.0 0.0 0.0 0.0  BASOSABS  --  0.0 0.0 0.0 0.0    Chemistries  Recent Labs  Lab 09/06/20 0428 09/07/20 0315 09/08/20 0307 09/09/20 0136 09/10/20 0318  NA 139 140 135 139 138  K 4.3 4.7 5.3* 4.0 4.3  CL 103 103 102 103 105  CO2 23 24 23 24 23   GLUCOSE 146* 182* 250* 111* 81  BUN 48* 49* 55* 59* 50*  CREATININE 2.46* 2.48* 2.40* 2.46* 2.15*  CALCIUM 9.9 10.0 9.9 9.8 10.0  MG  --  2.3 2.3  --   --   AST 50* 56* 65* 110* 55*  ALT 176* 161* 151* 245* 181*  ALKPHOS 156* 172* 185* 233* 215*  BILITOT 1.3* 1.2 1.1 1.3* 1.1   ------------------------------------------------------------------------------------------------------------------ No results for input(s): CHOL, HDL, LDLCALC, TRIG, CHOLHDL, LDLDIRECT in the last 72 hours.  Lab Results  Component Value Date   HGBA1C 8.2 (H) 08/19/2020   ------------------------------------------------------------------------------------------------------------------ No results for input(s): TSH, T4TOTAL, T3FREE, THYROIDAB in the last 72  hours.  Invalid input(s): FREET3 ------------------------------------------------------------------------------------------------------------------ Recent Labs    09/08/20 1115 09/08/20 1116  VITAMINB12 686  --   FOLATE 5.6*  --   FERRITIN 3,647*  --   TIBC 295  --   IRON 96  --   RETICCTPCT  --  3.2*    Coagulation profile Recent Labs  Lab 09/10/20 0318  INR 1.2    Recent Labs    09/09/20 0136 09/10/20 0318  DDIMER 0.42 0.37    Cardiac Enzymes No results for input(s): CKMB, TROPONINI, MYOGLOBIN in the last 168 hours.  Invalid input(s): CK ------------------------------------------------------------------------------------------------------------------    Component Value Date/Time   BNP 91.9 09/10/2020 0318    Micro Results Recent Results (from the past 240 hour(s))  MRSA PCR Screening     Status: None   Collection Time: 09/07/20  9:37 AM   Specimen: Nasopharyngeal  Result Value Ref Range Status   MRSA by PCR NEGATIVE NEGATIVE Final    Comment:        The GeneXpert MRSA Assay (FDA approved for NASAL specimens only), is one component of a comprehensive MRSA colonization surveillance program. It is not intended to diagnose MRSA infection nor to guide or monitor treatment for MRSA infections. Performed at [facility], [place] [address] [place], [place] [address]     Radiology Reports CT CHEST WO CONTRAST  Result Date: 08/24/2020 CLINICAL DATA:  Severe hypoxia and respiratory failure with COVID-19 infection. EXAM: CT CHEST WITHOUT CONTRAST TECHNIQUE: Multidetector CT imaging of the chest was performed following the standard protocol without IV contrast. COMPARISON:  Chest x-ray 08/18/2020 FINDINGS: Cardiovascular: Mild cardiomegaly. Mild calcification over the mitral valve annulus. Thoracic aorta is normal caliber. Stent over the right subclavian/axillary region. Remaining vascular structures are unremarkable. Mediastinum/Nodes: No evidence of  mediastinal or hilar adenopathy. Remaining mediastinal structures are unremarkable. Lungs/Pleura: Lungs  are adequately inflated demonstrate moderate bilateral hazy airspace process likely multifocal viral pneumonia in this COVID-19 positive patient. No evidence of effusion. Airways are otherwise unremarkable. Upper Abdomen: No acute findings. Musculoskeletal: Degenerative change of the spine. Mild anterior wedging of a midthoracic vertebral body likely chronic. IMPRESSION: 1. Moderate bilateral hazy airspace process likely multifocal viral pneumonia in this COVID-19 positive patient. 2. Mild cardiomegaly. 3. Mild anterior wedging of a midthoracic vertebral body likely chronic. Electronically Signed   By: Marin Olp M.D.   On: 08/24/2020 12:37   US Renal Transplant w/Doppler  Result Date: 08/19/2020 CLINICAL DATA:  Acute kidney injury EXAM: ULTRASOUND OF RENAL TRANSPLANT WITH RENAL DOPPLER ULTRASOUND TECHNIQUE: Ultrasound examination of the renal transplant was performed with gray-scale, color and duplex doppler evaluation. COMPARISON:  None. FINDINGS: Transplant kidney location: Left lower quadrant Transplant Kidney: Renal measurements: 10.8 x 6.2 x 6.5 cm = volume: 268mL. Normal in size and parenchymal echogenicity. No evidence of mass or hydronephrosis. No peri-transplant fluid collection seen. Color flow in the main renal artery:  Yes Color flow in the main renal vein:  Yes Duplex Doppler Evaluation: Main Renal Artery Velocity:  cm/sec Main Renal Artery Resistive Index: 0.99 Venous waveform in main renal vein:  Present Intrarenal resistive index in upper pole:  0.98 (normal 0.6-0.8; equivocal 0.8-0.9; abnormal >= 0.9) Intrarenal resistive index in lower pole: 0.98 (normal 0.6-0.8; equivocal 0.8-0.9; abnormal >= 0.9) Bladder: Normal for degree of bladder distention. Other findings:  None. IMPRESSION: Elevated resistive indices within the left lower quadrant transplant kidney. No hydronephrosis.  Electronically Signed   By: Rolm Baptise M.D.   On: 08/19/2020 19:15   DG Chest Port 1 View  Result Date: 09/07/2020 CLINICAL DATA:  Post renal transplant x 2 most recently 2016, chronic immunosuppressive therapy, type II diabetes mellitus, presents with shortness of breath, acute hypoxic respiratory failure due to COVID-19 pneumonia EXAM: PORTABLE CHEST 1 VIEW COMPARISON:  Portable exam 1005 hours compared to 09/05/2020 FINDINGS: Enlargement of cardiac silhouette. Mediastinal contours and pulmonary vascularity normal. Atherosclerotic calcification aorta. BILATERAL scattered mixed airspace interstitial infiltrates consistent with multifocal pneumonia and COVID-19. No pleural effusion or pneumothorax. Bones demineralized with stent identified at the RIGHT axillary vessels. IMPRESSION: Persistent diffuse BILATERAL pulmonary infiltrates consistent with multifocal pneumonia and COVID-19. Electronically Signed   By: Lavonia Dana M.D.   On: 09/07/2020 10:28   DG CHEST PORT 1 VIEW  Result Date: 08/31/2020 CLINICAL DATA:  65 year old female with shortness of breath. EXAM: PORTABLE CHEST 1 VIEW COMPARISON:  Chest radiograph dated 08/18/2020. FINDINGS: Bilateral confluent and reticular densities, progressed since the prior radiograph and most consistent with multifocal pneumonia. No pleural effusion pneumothorax. Borderline cardiomegaly. No acute osseous pathology. IMPRESSION: Interval progression of bilateral pulmonary opacities compared to the radiograph of 08/18/2020. Continued follow-up recommended. Electronically Signed   By: Anner Crete M.D.   On: 08/31/2020 21:17   DG Chest Portable 1 View  Result Date: 08/18/2020 CLINICAL DATA:  65 year old female with history of COVID infection. Chest pain. Shortness of breath. EXAM: PORTABLE CHEST 1 VIEW COMPARISON:  Chest x-ray 10/20/2014. FINDINGS: Patchy multifocal airspace consolidation and areas of interstitial prominence are noted throughout the lungs  bilaterally, most severe throughout the periphery of the mid to lower left lung, indicative of multilobar bilateral pneumonia. No definite pleural effusions. No evidence of pulmonary edema. No pneumothorax. Heart size is borderline enlarged. Upper mediastinal contours are within normal limits. Atherosclerotic calcifications in the thoracic aorta. Vascular stent projecting over the right subclavian region. IMPRESSION:  1. Severe multilobar bilateral (left greater than right) pneumonia compatible with reported COVID infection. 2. Aortic atherosclerosis. Electronically Signed   By: Vinnie Langton M.D.   On: 08/18/2020 18:02   DG Chest Port 1V same Day  Result Date: 09/05/2020 CLINICAL DATA:  Shortness of breath, COVID positive EXAM: PORTABLE CHEST 1 VIEW COMPARISON:  08/31/2020 FINDINGS: Persistent diffuse bilateral pulmonary opacities. Aeration has decreased since the prior study. No significant pleural effusion. No pneumothorax. Stable cardiomediastinal contours. IMPRESSION: Progression of diffuse bilateral pulmonary opacities. Electronically Signed   By: Macy Mis M.D.   On: 09/05/2020 08:05   ECHOCARDIOGRAM LIMITED  Result Date: 09/02/2020    ECHOCARDIOGRAM LIMITED REPORT   Patient Name:   [facility] Date of Exam: 09/02/2020 Medical Rec #:  [id number]               Height:       62.0 in Accession #:    [id number]              Weight:       194.4 lb Date of Birth:  Nov 28, 1955              BSA:          1.889 m Patient Age:    71 years                BP:           135/66 mmHg Patient Gender: F                       HR:           100 bpm. Exam Location:  Inpatient Procedure: Limited Echo, Limited Color Doppler and Cardiac Doppler Indications:    chest pain  History:        Patient has prior history of Echocardiogram examinations, most                 recent 09/30/2006. Covid; Risk Factors:Diabetes and Hypertension.  Sonographer:    Johny Chess Referring Phys: [place]  1. Left ventricular ejection fraction, by estimation, is >75%. The left ventricle has hyperdynamic function. The left ventricle has no regional wall motion abnormalities. There is moderate left ventricular hypertrophy. Left ventricular diastolic parameters are consistent with Grade I diastolic dysfunction (impaired relaxation).  2. Right ventricular systolic function is normal. The right ventricular size is normal.  3. The mitral valve is normal in structure. No evidence of mitral valve regurgitation. No evidence of mitral stenosis. Moderate mitral annular calcification.  4. The aortic valve is tricuspid. Aortic valve regurgitation is not visualized. Mild aortic valve sclerosis is present, with no evidence of aortic valve stenosis.  5. The inferior vena cava is normal in size with greater than 50% respiratory variability, suggesting right atrial pressure of 3 mmHg. FINDINGS  Left Ventricle: Left ventricular ejection fraction, by estimation, is >75%. The left ventricle has hyperdynamic function. The left ventricle has no regional wall motion abnormalities. The left ventricular internal cavity size was normal in size. There is moderate left ventricular hypertrophy. Left ventricular diastolic parameters are consistent with Grade I diastolic dysfunction (impaired relaxation). Right Ventricle: The right ventricular size is normal. Right ventricular systolic function is normal. Left Atrium: Left atrial size was normal in size. Right Atrium: Right atrial size was normal in size. Pericardium: There is no evidence of pericardial effusion. Mitral Valve: The mitral valve is normal in structure. Moderate mitral  annular calcification. No evidence of mitral valve stenosis. Tricuspid Valve: The tricuspid valve is normal in structure. Tricuspid valve regurgitation is trivial. No evidence of tricuspid stenosis. Aortic Valve: The aortic valve is tricuspid. Aortic valve regurgitation is not visualized. Mild aortic valve  sclerosis is present, with no evidence of aortic valve stenosis. Pulmonic Valve: The pulmonic valve was normal in structure. Pulmonic valve regurgitation is not visualized. No evidence of pulmonic stenosis. Aorta: The aortic root is normal in size and structure. Venous: The inferior vena cava is normal in size with greater than 50% respiratory variability, suggesting right atrial pressure of 3 mmHg. IAS/Shunts: No atrial level shunt detected by color flow Doppler. LEFT VENTRICLE PLAX 2D LVIDd:         3.70 cm  Diastology LVIDs:         2.50 cm  LV e' medial:    5.33 cm/s LV PW:         1.00 cm  LV E/e' medial:  13.8 LV IVS:        0.90 cm  LV e' lateral:   5.66 cm/s LVOT diam:     1.70 cm  LV E/e' lateral: 13.0 LV SV:         59 LV SV Index:   31 LVOT Area:     2.27 cm  IVC IVC diam: 1.30 cm LEFT ATRIUM         Index LA diam:    3.90 cm 2.06 cm/m  AORTIC VALVE LVOT Vmax:   170.00 cm/s LVOT Vmean:  114.000 cm/s LVOT VTI:    0.258 m  AORTA Ao Asc diam: 2.60 cm MV E velocity: 73.50 cm/s MV A velocity: 121.00 cm/s  SHUNTS MV E/A ratio:  0.61         Systemic VTI:  0.26 m                             Systemic Diam: 1.70 cm Kirk Ruths MD Electronically signed by Kirk Ruths MD Signature Date/Time: 09/02/2020/12:26:37 PM    Final    US Abdomen Limited RUQ (LIVER/GB)  Result Date: 09/09/2020 CLINICAL DATA:  Transaminitis EXAM: ULTRASOUND ABDOMEN LIMITED RIGHT UPPER QUADRANT COMPARISON:  None. FINDINGS: Gallbladder: Gallbladder contains some echogenic biliary sludge as well as several slightly more echogenic foci which could reflect some small admixed gallstones (8/33). Borderline gallbladder wall thickening at 4 mm. No pericholecystic fluid or inflammation. Sonographic Percell Miller sign was reportedly negative by sonographer. Common bile duct: Diameter: Poorly visualized. Liver: No focal lesion identified. Within normal limits in parenchymal echogenicity. No intrahepatic biliary ductal dilatation. Challenging  visualization of the portal vein. Included portions of the portal vein is patent on color Doppler imaging with normal direction of blood flow towards the liver. Other: COVID positive patient with a technically challenging exam due to a seated positioning and inability to perform decubitus positioning. IMPRESSION: 1. Technically challenging exam due to patient's inability to perform decubitus positioning. 2. Biliary sludge with possible admixed gallstones and borderline gallbladder wall thickening albeit with a negative sonographic Murphy sign absent pericholecystic fluid. Findings are equivocal for acute cholecystitis though should be considered in the appropriate clinical context. If there is persisting clinical concern, HIDA could be obtained. 3. No intrahepatic biliary ductal dilatation. Challenging visualization of the common bile duct. Electronically Signed   By: Lovena Le M.D.   On: 09/09/2020 21:41

## 2020-09-10 NOTE — Progress Notes (Signed)
Physical Therapy Treatment Patient Details Name: [name] [name] MRN: [id number] DOB: November 17, 1955 Today's Date: 09/10/2020    History of Present Illness Pt is a 65 y.o. female who tested (+) COVID-19 on 08/13/20, now admitted 08/18/20 with worsening SOB, cough, chest pain and body aches. Workup for acute hypoxic respiratory failure due to COVID-19 PNA, DKA, AKI on CKD, UTI. PMH includes HTN, DM2, chronic steroid use, s/p renal transplant x2.Episode of CP 1/22; per Cardiology: troponin, EKG and history felt concerning for [place] / possibly NSTEMI - difficult to know in context of multiple medical stressors    PT Comments    Pt tolerated therapy much better today. Pt is less anxious and HR did not increase with exercise today. Pt reports she is frustrated by how weak her legs have gotten over the last week. Agreeable to working on LE exercises. Able to perform exercises with rest breaks maintaining HR below 120 and SaO2 >87%O2. Assisted pt with lunch set up prior to leaving room. D/c plans remain appropriate. PT will continue to follow acutely.    Follow Up Recommendations  SNF     Equipment Recommendations  Rolling walker with 5" wheels;3in1 (PT)       Precautions / Restrictions Precautions Precaution Comments: Watch SpO2 - quick to desaturate on HHFNC Restrictions Weight Bearing Restrictions: No    Mobility  Bed Mobility               General bed mobility comments: OOB in recliner on entry      Balance Overall balance assessment: Needs assistance Sitting-balance support: Feet supported;No upper extremity supported;Single extremity supported;Bilateral upper extremity supported Sitting balance-Leahy Scale: Fair Sitting balance - Comments: able to sit at the edge of the recliner with increased effort and 4/4 DoE                                    Cognition Arousal/Alertness: Awake/alert Behavior During Therapy: [facility] for tasks assessed/performed Overall  Cognitive Status: Within Functional Limits for tasks assessed                                 General Comments: pt with slight smile today and reduced anxiety when therapy in room      Exercises General Exercises - Lower Extremity Ankle Circles/Pumps: AROM;Both;10 reps;Seated Gluteal Sets: AROM;Both;10 reps;Seated Long Arc Quad: AROM;Both;10 reps;Seated Hip ABduction/ADduction: AROM;Both;10 reps;Seated Hip Flexion/Marching: AROM;Both;10 reps;Seated    General Comments General comments (skin integrity, edema, etc.): Pt with SaO2 87-93%O2 on 30L O@ via [place] at 50%FiO2, HR 108-119 bpm      Pertinent Vitals/Pain Pain Assessment: No/denies pain           PT Goals (current goals can now be found in the care plan section) Acute Rehab PT Goals Patient Stated Goal: Hopeful for return home, but willing to consider post-acute rehab at SNF PT Goal Formulation: With patient Time For Goal Achievement: 09/05/20 Potential to Achieve Goals: Good    Frequency    Min 2X/week      PT Plan Current plan remains appropriate       AM-PAC PT "6 Clicks" Mobility   Outcome Measure  Help needed turning from your back to your side while in a flat bed without using bedrails?: A Little Help needed moving from lying on your back to sitting on the side of a flat  bed without using bedrails?: A Little Help needed moving to and from a bed to a chair (including a wheelchair)?: A Little Help needed standing up from a chair using your arms (e.g., wheelchair or bedside chair)?: A Little Help needed to walk in hospital room?: A Lot Help needed climbing 3-5 steps with a railing? : A Lot 6 Click Score: 16    End of Session Equipment Utilized During Treatment: Oxygen Activity Tolerance: Patient limited by fatigue;Other (comment) (increased O2 demand) Patient left: with call bell/phone within reach;in bed;with bed alarm set Nurse Communication: Mobility status PT Visit Diagnosis: Other  abnormalities of gait and mobility (R26.89);Muscle weakness (generalized) (M62.81)     Time: 9276-3943 PT Time Calculation (min) (ACUTE ONLY): 13 min  Charges:  $Therapeutic Exercise: 8-22 mins                     Ahliyah Nienow B. Migdalia Dk PT, DPT Acute Rehabilitation Services Pager [phone] Office [phone]    [place] 09/10/2020, 2:23 PM

## 2020-09-11 DIAGNOSIS — J9601 Acute respiratory failure with hypoxia: Secondary | ICD-10-CM | POA: Diagnosis not present

## 2020-09-11 DIAGNOSIS — U071 COVID-19: Secondary | ICD-10-CM | POA: Diagnosis not present

## 2020-09-11 LAB — CBC WITH DIFFERENTIAL/PLATELET
Abs Immature Granulocytes: 0.06 10*3/uL (ref 0.00–0.07)
Basophils Absolute: 0 10*3/uL (ref 0.0–0.1)
Basophils Relative: 0 %
Eosinophils Absolute: 0 10*3/uL (ref 0.0–0.5)
Eosinophils Relative: 0 %
HCT: 21 % — ABNORMAL LOW (ref 36.0–46.0)
Hemoglobin: 7.2 g/dL — ABNORMAL LOW (ref 12.0–15.0)
Immature Granulocytes: 1 %
Lymphocytes Relative: 3 %
Lymphs Abs: 0.2 10*3/uL — ABNORMAL LOW (ref 0.7–4.0)
MCH: 27.6 pg (ref 26.0–34.0)
MCHC: 34.3 g/dL (ref 30.0–36.0)
MCV: 80.5 fL (ref 80.0–100.0)
Monocytes Absolute: 0.2 10*3/uL (ref 0.1–1.0)
Monocytes Relative: 3 %
Neutro Abs: 7.7 10*3/uL (ref 1.7–7.7)
Neutrophils Relative %: 93 %
Platelets: 88 10*3/uL — ABNORMAL LOW (ref 150–400)
RBC: 2.61 MIL/uL — ABNORMAL LOW (ref 3.87–5.11)
RDW: 21.1 % — ABNORMAL HIGH (ref 11.5–15.5)
WBC: 8.2 10*3/uL (ref 4.0–10.5)
nRBC: 0 % (ref 0.0–0.2)

## 2020-09-11 LAB — COMPREHENSIVE METABOLIC PANEL
ALT: 149 U/L — ABNORMAL HIGH (ref 0–44)
AST: 38 U/L (ref 15–41)
Albumin: 3 g/dL — ABNORMAL LOW (ref 3.5–5.0)
Alkaline Phosphatase: 208 U/L — ABNORMAL HIGH (ref 38–126)
Anion gap: 7 (ref 5–15)
BUN: 47 mg/dL — ABNORMAL HIGH (ref 8–23)
CO2: 24 mmol/L (ref 22–32)
Calcium: 9.7 mg/dL (ref 8.9–10.3)
Chloride: 106 mmol/L (ref 98–111)
Creatinine, Ser: 2.02 mg/dL — ABNORMAL HIGH (ref 0.44–1.00)
GFR, Estimated: 27 mL/min — ABNORMAL LOW (ref >60)
Glucose, Bld: 188 mg/dL — ABNORMAL HIGH (ref 70–99)
Potassium: 4.6 mmol/L (ref 3.5–5.1)
Sodium: 137 mmol/L (ref 135–145)
Total Bilirubin: 0.8 mg/dL (ref 0.3–1.2)
Total Protein: 5.4 g/dL — ABNORMAL LOW (ref 6.5–8.1)

## 2020-09-11 LAB — PROCALCITONIN: Procalcitonin: <0.1 ng/mL

## 2020-09-11 LAB — GLUCOSE, CAPILLARY
Glucose-Capillary: 130 mg/dL — ABNORMAL HIGH (ref 70–99)
Glucose-Capillary: 223 mg/dL — ABNORMAL HIGH (ref 70–99)
Glucose-Capillary: 266 mg/dL — ABNORMAL HIGH (ref 70–99)
Glucose-Capillary: 188 mg/dL — ABNORMAL HIGH (ref 70–99)

## 2020-09-11 LAB — PROTIME-INR
INR: 1.2 (ref 0.8–1.2)
Prothrombin Time: 14.3 seconds (ref 11.4–15.2)

## 2020-09-11 LAB — BRAIN NATRIURETIC PEPTIDE: B Natriuretic Peptide: 86.8 pg/mL (ref 0.0–100.0)

## 2020-09-11 LAB — D-DIMER, QUANTITATIVE: D-Dimer, Quant: 0.3 ug/mL-FEU (ref 0.00–0.50)

## 2020-09-11 LAB — PREPARE RBC (CROSSMATCH)

## 2020-09-11 LAB — C-REACTIVE PROTEIN: CRP: 0.7 mg/dL (ref <1.0)

## 2020-09-11 MED ORDER — FUROSEMIDE 10 MG/ML IJ SOLN: 60.0000 mg | Freq: Once | INTRAMUSCULAR | Status: DC

## 2020-09-11 MED ORDER — FUROSEMIDE 10 MG/ML IJ SOLN: 10.0000 mg | Freq: Once | INTRAMUSCULAR | Status: DC

## 2020-09-11 MED ORDER — DIPHENHYDRAMINE HCL 50 MG/ML IJ SOLN: 25.0000 mg | Freq: Four times a day (QID) | INTRAMUSCULAR | Status: DC | PRN

## 2020-09-11 MED ORDER — METHYLPREDNISOLONE SODIUM SUCC 40 MG IJ SOLR
20.0000 mg | Freq: Every day | INTRAMUSCULAR | Status: AC
  Administered 2020-09-12 – 2020-09-13 (×2): 20 mg via INTRAVENOUS
  Filled 2020-09-11 (×2): qty 1

## 2020-09-11 MED ORDER — FOLIC ACID 1 MG PO TABS
1.0000 mg | ORAL_TABLET | Freq: Every day | ORAL | Status: DC
  Administered 2020-09-11 – 2020-10-13 (×33): 1 mg via ORAL
  Filled 2020-09-11 (×34): qty 1

## 2020-09-11 MED ORDER — SODIUM CHLORIDE 0.9% IV SOLUTION: Freq: Once | INTRAVENOUS | Status: AC

## 2020-09-11 MED ORDER — FUROSEMIDE 10 MG/ML IJ SOLN
40.0000 mg | Freq: Once | INTRAMUSCULAR | Status: AC
  Administered 2020-09-11: 40 mg via INTRAVENOUS
  Filled 2020-09-11: qty 4

## 2020-09-11 NOTE — Progress Notes (Signed)
Occupational Therapy Treatment Patient Details Name: [name] [name] MRN: [id number] DOB: 03/06/1956 Today's Date: 09/11/2020    History of present illness Pt is a 65 y.o. female who tested (+) COVID-19 on 08/13/20, now admitted 08/18/20 with worsening SOB, cough, chest pain and body aches. Workup for acute hypoxic respiratory failure due to COVID-19 PNA, DKA, AKI on CKD, UTI. PMH includes HTN, DM2, chronic steroid use, s/p renal transplant x2.Episode of CP 1/22; per Cardiology: troponin, EKG and history felt concerning for [place] / possibly NSTEMI - difficult to know in context of multiple medical stressors   OT comments  Pt able to transfer to recliner with min A, but fatigues quite rapidly.  She required 30L heated [place] 100% Fi02 and NRB to maintain Sp02 86% with activity.   She is making very little to no progress with OT due to decreased activity tolerance, therefore goals downgraded.  She was very tearful during session today, voicing frustration with her situation. Recommend chaplain visit - RN notified.   Follow Up Recommendations  SNF;LTACH;Supervision/Assistance - 24 hour    Equipment Recommendations  3 in 1 bedside commode    Recommendations for Other Services      Precautions / Restrictions Precautions Precautions: Fall;Other (comment) Precaution Comments: Watch SpO2 - quick to desaturate on HHFNC       Mobility Bed Mobility Overal bed mobility: Needs Assistance Bed Mobility: Supine to Sit     Supine to sit: Min assist;Min guard     General bed mobility comments: increased time  Transfers Overall transfer level: Needs assistance Equipment used: 1 person hand held assist Transfers: Sit to/from [facility] Sit to Stand: Min assist Stand pivot transfers: Min assist       General transfer comment: assist to steady    Balance Overall balance assessment: Needs assistance Sitting-balance support: Feet supported;No upper extremity  supported;Single extremity supported;Bilateral upper extremity supported Sitting balance-Leahy Scale: Fair     Standing balance support: Single extremity supported;Bilateral upper extremity supported Standing balance-Leahy Scale: Poor Standing balance comment: Reliant on UE support, dependent for posterior pericare while standing                           ADL either performed or assessed with clinical judgement   ADL Overall ADL's : Needs assistance/impaired                         Toilet Transfer: Minimal assistance;Stand-pivot;BSC;RW           Functional mobility during ADLs: Minimal assistance;Rolling walker General ADL Comments: fatigues rapidly with activity     Vision       Perception     Praxis      Cognition Arousal/Alertness: Awake/alert Behavior During Therapy: WFL for tasks assessed/performed Overall Cognitive Status: Within Functional Limits for tasks assessed                                 General Comments: [facility] for basic info.  Pt tearful during session, and voices frustration with situation.  Allowed her to vent and discuss feelings        Exercises General Exercises - Upper Extremity Shoulder Flexion: AROM;Right;Left;Seated;20 reps Elbow Flexion: AROM;Right;Left;10 reps;Supine   Shoulder Instructions       General Comments Pt on 30L heated [place] 02 Fi02 70%.  She moved to EOB with Sp02 decreasing to  78% - added NRB with pt rebounding to 86-88%.  increased Fi02 to 100% per nursing instruction.  Sp02 86% with transfer, but rebounded to 94% at rest and able to return Fi02 to 70%    Pertinent Vitals/ Pain       Pain Assessment: No/denies pain  Home Living                                          Prior Functioning/Environment              Frequency  Min 2X/week        Progress Toward Goals  OT Goals(current goals can now be found in the care plan section)  Progress towards OT  goals: Not progressing toward goals - comment (due to limited activity level)  Acute Rehab OT Goals Patient Stated Goal: to get stronger OT Goal Formulation: With patient Time For Goal Achievement: 09/25/20 Potential to Achieve Goals: Fair ADL Goals Pt Will Perform Grooming: with set-up;sitting Pt Will Perform Lower Body Bathing: with min assist;sit to/from stand Pt Will Perform Upper Body Dressing: with min assist;sitting Pt Will Perform Lower Body Dressing: with mod assist;sit to/from stand Pt Will Transfer to Toilet: with min guard assist;stand pivot transfer;bedside commode Pt Will Perform Toileting - Clothing Manipulation and hygiene: with min assist;sit to/from stand Pt/caregiver will Perform Home Exercise Program: Increased strength;Both right and left upper extremity;With written HEP provided;Independently;With theraband Additional ADL Goal #1: Pt will independently demonstrate carryover of energy conservation techniques during ADL/functional task.  Plan Discharge plan remains appropriate    Co-evaluation                 AM-PAC OT "6 Clicks" Daily Activity     Outcome Measure   Help from another person eating meals?: None Help from another person taking care of personal grooming?: A Little Help from another person toileting, which includes using toliet, bedpan, or urinal?: A Lot Help from another person bathing (including washing, rinsing, drying)?: A Lot Help from another person to put on and taking off regular upper body clothing?: A Lot Help from another person to put on and taking off regular lower body clothing?: A Lot 6 Click Score: 15    End of Session Equipment Utilized During Treatment: Oxygen  OT Visit Diagnosis: Unsteadiness on feet (R26.81)   Activity Tolerance Patient limited by fatigue   Patient Left in chair;with call bell/phone within reach   Nurse Communication Mobility status        Time: 2831-5176 OT Time Calculation (min): 38  min  Charges: OT General Charges $OT Visit: 1 Visit OT Treatments $Therapeutic Activity: 23-37 mins $Therapeutic Exercise: 8-22 mins  Nilsa Nutting., OTR/L Acute Rehabilitation Services Pager [phone] Office [phone]    [name] 09/11/2020, 4:48 PM

## 2020-09-11 NOTE — Telephone Encounter (Signed)
Patient is still admitted so will remove from triage.

## 2020-09-11 NOTE — Progress Notes (Signed)
   09/10/20 2105  Assess: MEWS Score  Temp 98.3 F (36.8 C)  BP 127/64  Pulse Rate (!) 111  ECG Heart Rate (!) 112  Resp (!) 27  Level of Consciousness Alert  O2 Device HFNC  Assess: MEWS Score  MEWS Temp 0  MEWS Systolic 0  MEWS Pulse 2  MEWS RR 2  MEWS LOC 0  MEWS Score 4  MEWS Score Color Red  Assess: if the MEWS score is Yellow or Red  Were vital signs taken at a resting state? Yes  Focused Assessment No change from prior assessment  Early Detection of Sepsis Score *See Row Information* Low  MEWS guidelines implemented *See Row Information* Yes  Treat  MEWS Interventions Escalated (See documentation below)  Pain Scale 0-10  Pain Score 0  Take Vital Signs  Increase Vital Sign Frequency  Red: Q 1hr X 4 then Q 4hr X 4, if remains red, continue Q 4hrs  Escalate  MEWS: Escalate Red: discuss with charge nurse/RN and provider, consider discussing with RRT  Notify: Charge Nurse/RN  Name of Charge Nurse/RN Notified Elmyra Ricks  Date Charge Nurse/RN Notified 09/10/20  Time Charge Nurse/RN Notified 2115  Notify: Provider  Provider Name/Title [place]  Date Provider Notified 09/30/20  Time Provider Notified 2150  Notification Type Page  Notification Reason Change in status (Red mew)  Response No new orders  Date of Provider Response 09/30/20  Time of Provider Response 2238

## 2020-09-11 NOTE — Progress Notes (Addendum)
[facility] Progress Note   65 yo F with hx of renal transplant x2 (latest 2016) on prograf, myfortic, and low-dose prednisone, T2DM, hypothyroidism, and recent diagnosis of COVID-19 presenting with acute hypoxic respiratory failure 2/2 COVID PNA. Also found to be in DKA with worsening AoCKD and hyperkalemia on labs.  Electrolyte disturbances improved.  Patient is slowly improving but creatinine remains consistently over 2.  Assessment:    1. AKI on CKD;  Cr presentation 4, range from 1.9 1/17 to 1/24 then uptrending to 2.46 1/29. Creatinine currently stable at 2.02 with no current indications for dialysis. Hold IVF and continue to monitor creatinine daily. 2. H/O DDKT x 2 (latest 2016). Baseline Cr ?1.7 but probably around 1.5-2.Creatinine today of 2.02. Follows w/ Dr. Marval Regal ([facility]) & [facility]. Holding prograf and cellcept for Covid19 infection with worsening status.  3. Chronic immunosuppressionTac/MMF/Pred- as above - prograf and cellcept on hold 4. Hypoxic respiratory failure secondary COVID-19 PNA; acutely worsened 1/28 and broad spectrum antibiotics added- then stopped. Continued hypoxia requiring 30L HFNC. 5. DKA, h/o uncontrolled DM2w/ hyperglycemia, resolved 6. Hyperkalemia, intermittent.   No treatment needed today  7. Anion Gap Metabolic acidosis, resolved/stable on NaHCO3 8. HTN-  She does not appear volume overloaded this AM. BP range over last 24hours of 107-130/60-74. BP meds include amlodipine 10mg , hydralazine 25mg  q8hr. 9. Anemia- hgb 7.2 today-   iron stores OK - give ESA. Patient was receiving a unit of blood on my encounter this morning.   Plan: -Per documentation there were frank discussions regarding status and recommended discontinuing prograf since 1/30.   Dispo - Code status DNR with plans for comfort care if patient has worsening status per documentation.  Patient seen and examined, agree with above note with above modifications.  Sitting up in chair upon my exam-  Some in WOB-  On very high flow O2-  Fortunately her renal function is holding its own off of myfortic and prograf.  Have not seen enough clinical improvement from a covid standpoint to add prograf back yet   Corliss Parish, MD 09/11/2020     Subjective:   Patient states that she had a bad day yesterday and feels slightly better today. She states her breathing feels about the same. Still requiring so much O2   Objective:   BP 111/64   Pulse 100   Temp 97.8 F (36.6 C) (Axillary)   Resp (!) 22   Ht 5\' 2"  (1.575 m)   Wt 86.2 kg   SpO2 98%   BMI 34.76 kg/m   Intake/Output Summary (Last 24 hours) at 09/11/2020 3428 Last data filed at 09/10/2020 2319 Gross per 24 hour  Intake 480 ml  Output --  Net 480 ml   Weight change:   Physical Exam:  General: Alert and oriented in no apparent distress Heart: Tachycardic rate, no murmur. No pitting edema of lower extremities Lungs: No obvious wheezing or crackles, breath sounds faint but equal, continues on HFNC Skin: Warm and dry  Imaging: DG Chest Port 1 View  Result Date: 09/10/2020 CLINICAL DATA:  COVID positive EXAM: PORTABLE CHEST 1 VIEW COMPARISON:  09/07/2020 FINDINGS: Diffuse bilateral airspace disease unchanged. No significant pleural effusion. No pneumothorax. Cardiac enlargement.  Right axillary stent. IMPRESSION: Diffuse bilateral airspace disease unchanged from the prior study. Electronically Signed   By: Franchot Gallo M.D.   On: 09/10/2020 09:10   US Abdomen Limited RUQ (LIVER/GB)  Result Date: 09/09/2020 CLINICAL DATA:  Transaminitis EXAM: ULTRASOUND ABDOMEN LIMITED RIGHT  UPPER QUADRANT COMPARISON:  None. FINDINGS: Gallbladder: Gallbladder contains some echogenic biliary sludge as well as several slightly more echogenic foci which could reflect some small admixed gallstones (8/33). Borderline gallbladder wall thickening at 4 mm. No pericholecystic fluid or inflammation. Sonographic Percell Miller  sign was reportedly negative by sonographer. Common bile duct: Diameter: Poorly visualized. Liver: No focal lesion identified. Within normal limits in parenchymal echogenicity. No intrahepatic biliary ductal dilatation. Challenging visualization of the portal vein. Included portions of the portal vein is patent on color Doppler imaging with normal direction of blood flow towards the liver. Other: COVID positive patient with a technically challenging exam due to a seated positioning and inability to perform decubitus positioning. IMPRESSION: 1. Technically challenging exam due to patient's inability to perform decubitus positioning. 2. Biliary sludge with possible admixed gallstones and borderline gallbladder wall thickening albeit with a negative sonographic Murphy sign absent pericholecystic fluid. Findings are equivocal for acute cholecystitis though should be considered in the appropriate clinical context. If there is persisting clinical concern, HIDA could be obtained. 3. No intrahepatic biliary ductal dilatation. Challenging visualization of the common bile duct. Electronically Signed   By: Lovena Le M.D.   On: 09/09/2020 21:41    Labs: BMET Recent Labs  Lab 09/05/20 0129 09/06/20 0428 09/07/20 0315 09/08/20 0307 09/09/20 0136 09/10/20 0318 09/11/20 0155  NA 140 139 140 135 139 138 137  K 4.4 4.3 4.7 5.3* 4.0 4.3 4.6  CL 107 103 103 102 103 105 106  CO2 22 23 24 23 24 23 24   GLUCOSE 98 146* 182* 250* 111* 81 188*  BUN 48* 48* 49* 55* 59* 50* 47*  CREATININE 2.12* 2.46* 2.48* 2.40* 2.46* 2.15* 2.02*  CALCIUM 9.6 9.9 10.0 9.9 9.8 10.0 9.7  PHOS 3.4 5.5*  --   --   --   --   --    CBC Recent Labs  Lab 09/08/20 0307 09/09/20 0136 09/10/20 0318 09/11/20 0155  WBC 14.1* 15.4* 10.0 8.2  NEUTROABS 13.5* 14.5* 9.4* 7.7  HGB 7.4* 7.1* 7.2* 7.2*  HCT 21.8* 21.1* 20.9* 21.0*  MCV 78.1* 79.0* 80.1 80.5  PLT 101* 99* 92* 88*    Medications:    . amLODipine  10 mg Oral Daily  .  vitamin C  500 mg Oral Daily  . aspirin EC  81 mg Oral Daily  . darbepoetin (ARANESP) injection - NON-DIALYSIS  150 mcg Subcutaneous Q Tue-1800  . enoxaparin (LOVENOX) injection  30 mg Subcutaneous Q24H  . feeding supplement (NEPRO CARB STEADY)  237 mL Oral BID BM  . fluticasone  1 spray Each Nare Daily  . hydrALAZINE  25 mg Oral Q8H  . insulin aspart  0-15 Units Subcutaneous TID WC  . insulin detemir  8 Units Subcutaneous BID  . isosorbide mononitrate  60 mg Oral Daily  . levothyroxine  50 mcg Oral Q0600  . LORazepam  1 mg Oral BID  . mouth rinse  15 mL Mouth Rinse BID  . methylPREDNISolone (SOLU-MEDROL) injection  40 mg Intravenous Daily  . pantoprazole  40 mg Oral BID  . zinc sulfate  220 mg Oral Daily    Lurline Del, DO  09/11/2020, 6:55 AM

## 2020-09-11 NOTE — Progress Notes (Signed)
Family updated.  All questions answered.

## 2020-09-11 NOTE — Progress Notes (Signed)
PROGRESS NOTE                                                                                                                                                                                                             Patient Demographics:    [name] [name], is a 65 y.o. female, DOB - 1956/02/17, BPZ:[id number]  Outpatient Primary MD for the patient is Glendale Chard, MD   Admit date - 08/18/2020   LOS - 78  Chief Complaint  Patient presents with  . Covid Positive  . Chest Pain  . Weakness       Brief Narrative: Patient is a 65 y.o. female with PMHx of renal transplant x2 (latest in 2016) on chronic immunosuppressive agents, DM-2, hypothyroidism-who presented with shortness of breath-found to have acute hypoxic respiratory failure due to COVID-19 pneumonia.  Hospital course complicated by slow improvement from severe hypoxemia, AKI and non-STEMI.  COVID-19 vaccinated status: Vaccinated including booster  Significant Events: 1/10>> Admit to [facility] for acute hypoxic respiratory failure due to COVID-19 pneumonia 1/11>> transfer to ICU due to worsening hypoxemia, AKI/metabolic acidosis/hyperkalemia. 1/14>> transferred to [facility] 1/23>> chest pain-non-STEMI-cardiology consult 1/25>> chest pain-resolved by SL nitroglycerin x1 1/28>> tachypneic- worsening hypoxemia-now on 15 L via NRB and 15 L via HFNC  Significant studies: 1/10>> chest x-ray: Severe multilobar bilateral pneumonia. 1/11>> ultrasound renal transplant: No hydronephrosis 1/16>> CT chest: Multifocal viral pneumonia 1/26>>Echo: EF> 70%, LVH, grade 1 diastolic dysfunction 2/42>> chest x-ray: Progression of diffuse bilateral pulmonary opacitie   COVID-19 medications: Steroids: 1/10>> Remdesivir: 1/10>>1/15 Baricitinib: 1/16>>  Antibiotics: Rocephin: 1/12>> 1/18 Cefepime: 1/28>> Vancomycin: 1/28>>  Microbiology data: 1/10 >>blood culture: No  growth 1/11>> urine culture: Klebsiella pneumoniae  Procedures: None  Consults: PCCM, nephrology, cardiology  DVT prophylaxis: enoxaparin (LOVENOX) injection 30 mg Start: 09/03/20 1400 SCDs Start: 08/19/20 0933    Subjective:   Patient in bed, appears comfortable, denies any headache, no fever, no chest pain or pressure, no shortness of breath , no abdominal pain. No focal weakness.   Assessment  & Plan :   Acute Hypoxic Resp Failure due to Covid 19 Viral pneumonia: she has been fully vaccinated however she was immunocompromised due to her underlying history of renal transplant and being on immunosuppressive medications.  She developed severe parenchymal lung injury due to COVID -19 pneumonia/ARDS, has been treated with IV  steroids, Baricitinib and Remdesivir combination along with as needed Lasix.  She has also been given a trial of IV antibiotics for any superimposed bacterial component to her pneumonia  Despite aggressive measures she remains severely hypoxic, detailed discussion between treatment team and family has been done by previous MD and me.  Goal of care is gentle medical treatment to continue, if significant decline or worsening focus on comfort measures.   Although she has leukocytosis I think this is largely due to steroids, her procalcitonin has been undetectable, I will at this time stop antibiotics on 09/07/2020, no productive cough or focal infiltrate on x-ray.  Will continue Solu-Medrol taper, she has finished Remdesivir and Baricitinib.    Continue heated high flow with supplemental nonrebreather mask upon increase activity and oxygen demand, will try and keep her on 1 source if possible for majority of the time to know exactly what her oxygen requirements are.  If any clinical worsening or discomfort full comfort care will be initiated, I have discussed this with daughter personally on 09/06/2020.  Prone/Incentive Spirometry: encouraged  incentive spirometry use  3-4/hour.  O2 requirements:   SpO2: (!) 87 % O2 Flow Rate (L/min): 30 L/min FiO2 (%): (S) 70 % (found on 60, increased to 70)     Recent Labs  Lab 09/07/20 0315 09/08/20 0307 09/09/20 0136 09/10/20 0318 09/11/20 0155  WBC 15.8* 14.1* 15.4* 10.0 8.2  HGB 8.3* 7.4* 7.1* 7.2* 7.2*  HCT 23.9* 21.8* 21.1* 20.9* 21.0*  PLT 119* 101* 99* 92* 88*  CRP 0.6 0.7 0.6 0.6 0.7  BNP 191.7* 136.7* 113.1* 91.9 86.8  DDIMER 0.32 0.30 0.42 0.37 0.30  PROCALCITON <0.10 <0.10 0.13 <0.10 <0.10  AST 56* 65* 110* 55* 38  ALT 161* 151* 245* 181* 149*  ALKPHOS 172* 185* 233* 215* 208*  BILITOT 1.2 1.1 1.3* 1.1 0.8  ALBUMIN 3.0* 2.9* 2.8* 2.9* 3.0*  INR  --   --   --  1.2 1.2     Sinus pause: Occurred on 1/20-approximately 4.25 seconds-this occurred while she was sleeping-after discussion with cardiology-beta-blocker discontinued.  Needs outpatient polysomnography.  Non-STEMI: Developed severe chest pain on 1/23 with EKG changes-relieved with nitroglycerin and morphine infusion-chest pain reoccurred on 1/25.  Echo is reassuring with preserved EF.  On aspirin/statin-has completed the 48 hours of IV heparin.  Cardiology has signed off.  AKI on CKD stage IV-transplanted kidney (second renal transplant-01/2015): AKI hemodynamically mediated-creatinine has overall improved-and plateaued to around 1.9 range-but lately has started to trend up. Nephrology reconsulted-Myfortic remains on hold-on Prograf and steroids-Prograf levels pending.  Nephrology following, has developed some crackles on 09/08/2020 with hyperkalemia, will give low-dose Lasix x 1 oral on 09/08/2020.  Hyperkalemia/anion gap metabolic acidosis: Secondary to AKI-has resolved.  Transaminitis: LFTs have spiked a bit on 09/09/2020, hold statin, right upper quadrant ultrasound is nonacute with some sludge and gallstones but no signs of Murphy sign or BD obstruction, negative acute hepatitis panel.  Patient is asymptomatic, LFT trend is now coming down  we will continue to monitor.    Anemia: Combination of anemia of chronic disease and critical illness.  Will transfuse her with 1 unit on 09/11/2020 as she is remaining short of breath with hemoglobin around 7 i.e. making it symptomatic anemia, no signs of active bleeding, unremarkable anemia panel.  Thrombocytopenia: Mild-stable-gradually improving-watch closely.   Complicated UTI: Completed a course of Rocephin.  Afebrile and nontoxic-appearing.  DKA: Resolved-no longer on insulin drip.  Hypothyroidism: Continue levothyroxine-given suppressed TSH-I have decreased  dosage to 50 mcg-repeat TSH in 3 months.  HTN: BP stable-continue manidipine and hydralazine.   HLD: Continue statin.  Obesity: BMI of 34 follow with PCP post discharge for weight loss.   DM-2 (A1c 8.2 on 1/11) with uncontrolled hyperglycemia due to steroids: Levemir and sliding scale dose adjusted on 09/10/2020 with rapidly decreasing steroids.   Lab Results  Component Value Date   HGBA1C 8.2 (H) 08/19/2020   CBG (last 3)  Recent Labs    09/10/20 1715 09/10/20 2047 09/11/20 0757  GLUCAP 336* 272* 130*    RN pressure injury documentation: Pressure Injury 08/20/20 Coccyx Mid Stage 1 -  Intact skin with non-blanchable redness of a localized area usually over a bony prominence. (Active)  08/20/20 0800  Location: Coccyx  Location Orientation: Mid  Staging: Stage 1 -  Intact skin with non-blanchable redness of a localized area usually over a bony prominence.  Wound Description (Comments):   Present on Admission: Yes    GI prophylaxis : PPI  Condition - Extremely Guarded  Family Communication :  Daughter Mendel Ryder [phone] updated on 09/04/20, 09/06/20, 09/09/2020  Code Status :  Full Code  Diet :  Diet Order            Diet Carb Modified Fluid consistency: Thin; Room service appropriate? Yes  Diet effective now                  Disposition Plan  :   Status is: Inpatient  Remains inpatient appropriate  because:Inpatient level of care appropriate due to severity of illness   Dispo: The patient is from: Home              Anticipated d/c is to: SNF              Anticipated d/c date is: > 3 days              Patient currently is not medically stable to d/c.   Barriers to discharge: Worsening hypoxia requiring O2 supplementation  Antimicorbials  :    Anti-infectives (From admission, onward)   Start     Dose/Rate Route Frequency Ordered Stop   09/07/20 1030  vancomycin (VANCOCIN) IVPB 1000 mg/200 mL premix  Status:  Discontinued        1,000 mg 200 mL/hr over 60 Minutes Intravenous Every 48 hours 09/05/20 0933 09/07/20 0951   09/05/20 1030  vancomycin (VANCOREADY) IVPB 2000 mg/400 mL        2,000 mg 200 mL/hr over 120 Minutes Intravenous  Once 09/05/20 0933 09/05/20 2018   09/05/20 1030  ceFEPIme (MAXIPIME) 2 g in sodium chloride 0.9 % 100 mL IVPB  Status:  Discontinued        2 g 200 mL/hr over 30 Minutes Intravenous Every 24 hours 09/05/20 0933 09/07/20 0951   08/20/20 1445  cefTRIAXone (ROCEPHIN) 1 g in sodium chloride 0.9 % 100 mL IVPB        1 g 200 mL/hr over 30 Minutes Intravenous Every 24 hours 08/20/20 1348 08/26/20 2147   08/20/20 1000  remdesivir 100 mg in sodium chloride 0.9 % 100 mL IVPB       "Followed by" Linked Group Details   100 mg 200 mL/hr over 30 Minutes Intravenous Daily 08/19/20 0021 08/23/20 1113   08/19/20 0130  remdesivir 200 mg in sodium chloride 0.9% 250 mL IVPB       "Followed by" Linked Group Details   200 mg 580 mL/hr over 30 Minutes Intravenous Once  08/19/20 0021 08/19/20 0314      Inpatient Medications   Scheduled Meds: . amLODipine  10 mg Oral Daily  . vitamin C  500 mg Oral Daily  . aspirin EC  81 mg Oral Daily  . darbepoetin (ARANESP) injection - NON-DIALYSIS  150 mcg Subcutaneous Q Tue-1800  . enoxaparin (LOVENOX) injection  30 mg Subcutaneous Q24H  . feeding supplement (NEPRO CARB STEADY)  237 mL Oral BID BM  . fluticasone  1 spray Each  Nare Daily  . furosemide  10 mg Intravenous Once  . hydrALAZINE  25 mg Oral Q8H  . insulin aspart  0-15 Units Subcutaneous TID WC  . insulin detemir  8 Units Subcutaneous BID  . isosorbide mononitrate  60 mg Oral Daily  . levothyroxine  50 mcg Oral Q0600  . LORazepam  1 mg Oral BID  . mouth rinse  15 mL Mouth Rinse BID  . methylPREDNISolone (SOLU-MEDROL) injection  40 mg Intravenous Daily  . pantoprazole  40 mg Oral BID  . zinc sulfate  220 mg Oral Daily   Continuous Infusions: . sodium chloride    . sodium chloride Stopped (08/31/20 1545)   PRN Meds:.sodium chloride, acetaminophen **OR** acetaminophen, albuterol, diphenhydrAMINE, guaiFENesin, hydrALAZINE, loperamide, ondansetron (ZOFRAN) IV, polyethylene glycol, sodium chloride   Time Spent in minutes  45   The patient is critically ill with multiple organ system failure and requires high complexity decision making for assessment and support, frequent evaluation and titration of therapies, advanced monitoring, review of radiographic studies and interpretation of complex data.    See all Orders from today for further details   Lala Lund M.D on 09/11/2020 at 10:34 AM  To page go to [email or web address] - use universal password  [facility] -  Office  [phone]    Objective:   Vitals:   09/11/20 0800 09/11/20 0837 09/11/20 0907 09/11/20 0953  BP: 128/66 128/64 125/72 125/72  Pulse: (!) 105 (!) 108 (!) 111 (!) 111  Resp: (!) 23 (!) 29 (!) 29 (!) 36  Temp: 98.3 F (36.8 C) 98.2 F (36.8 C) 98.1 F (36.7 C)   TempSrc: Oral Axillary Oral   SpO2: (!) 89% 93% 97% (!) 87%  Weight:      Height:        Wt Readings from Last 3 Encounters:  09/09/20 86.2 kg  05/14/20 92.5 kg  05/14/20 92.9 kg     Intake/Output Summary (Last 24 hours) at 09/11/2020 1034 Last data filed at 09/11/2020 0907 Gross per 24 hour  Intake 1035 ml  Output -  Net 1035 ml     Physical Exam  Awake Alert, No new F.N deficits, Normal  affect [place].AT,PERRAL Supple Neck,No JVD, No cervical lymphadenopathy appriciated.  Symmetrical Chest wall movement, Good air movement bilaterally, CTAB RRR,No Gallops, Rubs or new Murmurs, No Parasternal Heave +ve B.Sounds, Abd Soft, No tenderness, No organomegaly appriciated, No rebound - guarding or rigidity. No Cyanosis, Clubbing or edema, No new Rash or bruise    Data Review:    CBC Recent Labs  Lab 09/07/20 0315 09/08/20 0307 09/09/20 0136 09/10/20 0318 09/11/20 0155  WBC 15.8* 14.1* 15.4* 10.0 8.2  HGB 8.3* 7.4* 7.1* 7.2* 7.2*  HCT 23.9* 21.8* 21.1* 20.9* 21.0*  PLT 119* 101* 99* 92* 88*  MCV 78.1* 78.1* 79.0* 80.1 80.5  MCH 27.1 26.5 26.6 27.6 27.6  MCHC 34.7 33.9 33.6 34.4 34.3  RDW 18.7* 19.0* 19.4* 20.5* 21.1*  LYMPHSABS 0.3* 0.2* 0.3* 0.2* 0.2*  MONOABS 0.3  0.2 0.4 0.3 0.2  EOSABS 0.0 0.0 0.0 0.0 0.0  BASOSABS 0.0 0.0 0.0 0.0 0.0    Chemistries  Recent Labs  Lab 09/07/20 0315 09/08/20 0307 09/09/20 0136 09/10/20 0318 09/11/20 0155  NA 140 135 139 138 137  K 4.7 5.3* 4.0 4.3 4.6  CL 103 102 103 105 106  CO2 24 23 24 23 24   GLUCOSE 182* 250* 111* 81 188*  BUN 49* 55* 59* 50* 47*  CREATININE 2.48* 2.40* 2.46* 2.15* 2.02*  CALCIUM 10.0 9.9 9.8 10.0 9.7  MG 2.3 2.3  --   --   --   AST 56* 65* 110* 55* 38  ALT 161* 151* 245* 181* 149*  ALKPHOS 172* 185* 233* 215* 208*  BILITOT 1.2 1.1 1.3* 1.1 0.8   ------------------------------------------------------------------------------------------------------------------ No results for input(s): CHOL, HDL, LDLCALC, TRIG, CHOLHDL, LDLDIRECT in the last 72 hours.  Lab Results  Component Value Date   HGBA1C 8.2 (H) 08/19/2020   ------------------------------------------------------------------------------------------------------------------ No results for input(s): TSH, T4TOTAL, T3FREE, THYROIDAB in the last 72 hours.  Invalid input(s):  FREET3 ------------------------------------------------------------------------------------------------------------------ Recent Labs    09/08/20 1115 09/08/20 1116  VITAMINB12 686  --   FOLATE 5.6*  --   FERRITIN 3,647*  --   TIBC 295  --   IRON 96  --   RETICCTPCT  --  3.2*    Coagulation profile Recent Labs  Lab 09/10/20 0318 09/11/20 0155  INR 1.2 1.2    Recent Labs    09/10/20 0318 09/11/20 0155  DDIMER 0.37 0.30    Cardiac Enzymes No results for input(s): CKMB, TROPONINI, MYOGLOBIN in the last 168 hours.  Invalid input(s): CK ------------------------------------------------------------------------------------------------------------------    Component Value Date/Time   BNP 86.8 09/11/2020 0155    Micro Results Recent Results (from the past 240 hour(s))  MRSA PCR Screening     Status: None   Collection Time: 09/07/20  9:37 AM   Specimen: Nasopharyngeal  Result Value Ref Range Status   MRSA by PCR NEGATIVE NEGATIVE Final    Comment:        The GeneXpert MRSA Assay (FDA approved for NASAL specimens only), is one component of a comprehensive MRSA colonization surveillance program. It is not intended to diagnose MRSA infection nor to guide or monitor treatment for MRSA infections. Performed at [facility], [place] [address], [place], [place] [address]     Radiology Reports CT CHEST WO CONTRAST  Result Date: 08/24/2020 CLINICAL DATA:  Severe hypoxia and respiratory failure with COVID-19 infection. EXAM: CT CHEST WITHOUT CONTRAST TECHNIQUE: Multidetector CT imaging of the chest was performed following the standard protocol without IV contrast. COMPARISON:  Chest x-ray 08/18/2020 FINDINGS: Cardiovascular: Mild cardiomegaly. Mild calcification over the mitral valve annulus. Thoracic aorta is normal caliber. Stent over the right subclavian/axillary region. Remaining vascular structures are unremarkable. Mediastinum/Nodes: No evidence of mediastinal or  hilar adenopathy. Remaining mediastinal structures are unremarkable. Lungs/Pleura: Lungs are adequately inflated demonstrate moderate bilateral hazy airspace process likely multifocal viral pneumonia in this COVID-19 positive patient. No evidence of effusion. Airways are otherwise unremarkable. Upper Abdomen: No acute findings. Musculoskeletal: Degenerative change of the spine. Mild anterior wedging of a midthoracic vertebral body likely chronic. IMPRESSION: 1. Moderate bilateral hazy airspace process likely multifocal viral pneumonia in this COVID-19 positive patient. 2. Mild cardiomegaly. 3. Mild anterior wedging of a midthoracic vertebral body likely chronic. Electronically Signed   By: Marin Olp M.D.   On: 08/24/2020 12:37   US Renal Transplant w/Doppler  Result Date: 08/19/2020 CLINICAL DATA:  Acute kidney injury EXAM: ULTRASOUND OF RENAL TRANSPLANT WITH RENAL DOPPLER ULTRASOUND TECHNIQUE: Ultrasound examination of the renal transplant was performed with gray-scale, color and duplex doppler evaluation. COMPARISON:  None. FINDINGS: Transplant kidney location: Left lower quadrant Transplant Kidney: Renal measurements: 10.8 x 6.2 x 6.5 cm = volume: 240mL. Normal in size and parenchymal echogenicity. No evidence of mass or hydronephrosis. No peri-transplant fluid collection seen. Color flow in the main renal artery:  Yes Color flow in the main renal vein:  Yes Duplex Doppler Evaluation: Main Renal Artery Velocity:  cm/sec Main Renal Artery Resistive Index: 0.99 Venous waveform in main renal vein:  Present Intrarenal resistive index in upper pole:  0.98 (normal 0.6-0.8; equivocal 0.8-0.9; abnormal >= 0.9) Intrarenal resistive index in lower pole: 0.98 (normal 0.6-0.8; equivocal 0.8-0.9; abnormal >= 0.9) Bladder: Normal for degree of bladder distention. Other findings:  None. IMPRESSION: Elevated resistive indices within the left lower quadrant transplant kidney. No hydronephrosis. Electronically Signed   By:  Rolm Baptise M.D.   On: 08/19/2020 19:15   DG Chest Port 1 View  Result Date: 09/10/2020 CLINICAL DATA:  COVID positive EXAM: PORTABLE CHEST 1 VIEW COMPARISON:  09/07/2020 FINDINGS: Diffuse bilateral airspace disease unchanged. No significant pleural effusion. No pneumothorax. Cardiac enlargement.  Right axillary stent. IMPRESSION: Diffuse bilateral airspace disease unchanged from the prior study. Electronically Signed   By: Franchot Gallo M.D.   On: 09/10/2020 09:10   DG Chest Port 1 View  Result Date: 09/07/2020 CLINICAL DATA:  Post renal transplant x 2 most recently 2016, chronic immunosuppressive therapy, type II diabetes mellitus, presents with shortness of breath, acute hypoxic respiratory failure due to COVID-19 pneumonia EXAM: PORTABLE CHEST 1 VIEW COMPARISON:  Portable exam 1005 hours compared to 09/05/2020 FINDINGS: Enlargement of cardiac silhouette. Mediastinal contours and pulmonary vascularity normal. Atherosclerotic calcification aorta. BILATERAL scattered mixed airspace interstitial infiltrates consistent with multifocal pneumonia and COVID-19. No pleural effusion or pneumothorax. Bones demineralized with stent identified at the RIGHT axillary vessels. IMPRESSION: Persistent diffuse BILATERAL pulmonary infiltrates consistent with multifocal pneumonia and COVID-19. Electronically Signed   By: Lavonia Dana M.D.   On: 09/07/2020 10:28   DG CHEST PORT 1 VIEW  Result Date: 08/31/2020 CLINICAL DATA:  65 year old female with shortness of breath. EXAM: PORTABLE CHEST 1 VIEW COMPARISON:  Chest radiograph dated 08/18/2020. FINDINGS: Bilateral confluent and reticular densities, progressed since the prior radiograph and most consistent with multifocal pneumonia. No pleural effusion pneumothorax. Borderline cardiomegaly. No acute osseous pathology. IMPRESSION: Interval progression of bilateral pulmonary opacities compared to the radiograph of 08/18/2020. Continued follow-up recommended. Electronically  Signed   By: Anner Crete M.D.   On: 08/31/2020 21:17   DG Chest Portable 1 View  Result Date: 08/18/2020 CLINICAL DATA:  66 year old female with history of COVID infection. Chest pain. Shortness of breath. EXAM: PORTABLE CHEST 1 VIEW COMPARISON:  Chest x-ray 10/20/2014. FINDINGS: Patchy multifocal airspace consolidation and areas of interstitial prominence are noted throughout the lungs bilaterally, most severe throughout the periphery of the mid to lower left lung, indicative of multilobar bilateral pneumonia. No definite pleural effusions. No evidence of pulmonary edema. No pneumothorax. Heart size is borderline enlarged. Upper mediastinal contours are within normal limits. Atherosclerotic calcifications in the thoracic aorta. Vascular stent projecting over the right subclavian region. IMPRESSION: 1. Severe multilobar bilateral (left greater than right) pneumonia compatible with reported COVID infection. 2. Aortic atherosclerosis. Electronically Signed   By: Vinnie Langton M.D.   On: 08/18/2020 18:02  DG Chest Port 1V same Day  Result Date: 09/05/2020 CLINICAL DATA:  Shortness of breath, COVID positive EXAM: PORTABLE CHEST 1 VIEW COMPARISON:  08/31/2020 FINDINGS: Persistent diffuse bilateral pulmonary opacities. Aeration has decreased since the prior study. No significant pleural effusion. No pneumothorax. Stable cardiomediastinal contours. IMPRESSION: Progression of diffuse bilateral pulmonary opacities. Electronically Signed   By: Macy Mis M.D.   On: 09/05/2020 08:05   ECHOCARDIOGRAM LIMITED  Result Date: 09/02/2020    ECHOCARDIOGRAM LIMITED REPORT   Patient Name:   [facility] Date of Exam: 09/02/2020 Medical Rec #:  [id number]               Height:       62.0 in Accession #:    [id number]              Weight:       194.4 lb Date of Birth:  02/06/1956              BSA:          1.889 m Patient Age:    41 years                BP:           135/66 mmHg Patient Gender: F                        HR:           100 bpm. Exam Location:  Inpatient Procedure: Limited Echo, Limited Color Doppler and Cardiac Doppler Indications:    chest pain  History:        Patient has prior history of Echocardiogram examinations, most                 recent 09/30/2006. Covid; Risk Factors:Diabetes and Hypertension.  Sonographer:    Johny Chess Referring Phys: [place]  1. Left ventricular ejection fraction, by estimation, is >75%. The left ventricle has hyperdynamic function. The left ventricle has no regional wall motion abnormalities. There is moderate left ventricular hypertrophy. Left ventricular diastolic parameters are consistent with Grade I diastolic dysfunction (impaired relaxation).  2. Right ventricular systolic function is normal. The right ventricular size is normal.  3. The mitral valve is normal in structure. No evidence of mitral valve regurgitation. No evidence of mitral stenosis. Moderate mitral annular calcification.  4. The aortic valve is tricuspid. Aortic valve regurgitation is not visualized. Mild aortic valve sclerosis is present, with no evidence of aortic valve stenosis.  5. The inferior vena cava is normal in size with greater than 50% respiratory variability, suggesting right atrial pressure of 3 mmHg. FINDINGS  Left Ventricle: Left ventricular ejection fraction, by estimation, is >75%. The left ventricle has hyperdynamic function. The left ventricle has no regional wall motion abnormalities. The left ventricular internal cavity size was normal in size. There is moderate left ventricular hypertrophy. Left ventricular diastolic parameters are consistent with Grade I diastolic dysfunction (impaired relaxation). Right Ventricle: The right ventricular size is normal. Right ventricular systolic function is normal. Left Atrium: Left atrial size was normal in size. Right Atrium: Right atrial size was normal in size. Pericardium: There is no evidence of  pericardial effusion. Mitral Valve: The mitral valve is normal in structure. Moderate mitral annular calcification. No evidence of mitral valve stenosis. Tricuspid Valve: The tricuspid valve is normal in structure. Tricuspid valve regurgitation is trivial. No evidence of tricuspid stenosis. Aortic Valve: The aortic valve  is tricuspid. Aortic valve regurgitation is not visualized. Mild aortic valve sclerosis is present, with no evidence of aortic valve stenosis. Pulmonic Valve: The pulmonic valve was normal in structure. Pulmonic valve regurgitation is not visualized. No evidence of pulmonic stenosis. Aorta: The aortic root is normal in size and structure. Venous: The inferior vena cava is normal in size with greater than 50% respiratory variability, suggesting right atrial pressure of 3 mmHg. IAS/Shunts: No atrial level shunt detected by color flow Doppler. LEFT VENTRICLE PLAX 2D LVIDd:         3.70 cm  Diastology LVIDs:         2.50 cm  LV e' medial:    5.33 cm/s LV PW:         1.00 cm  LV E/e' medial:  13.8 LV IVS:        0.90 cm  LV e' lateral:   5.66 cm/s LVOT diam:     1.70 cm  LV E/e' lateral: 13.0 LV SV:         59 LV SV Index:   31 LVOT Area:     2.27 cm  IVC IVC diam: 1.30 cm LEFT ATRIUM         Index LA diam:    3.90 cm 2.06 cm/m  AORTIC VALVE LVOT Vmax:   170.00 cm/s LVOT Vmean:  114.000 cm/s LVOT VTI:    0.258 m  AORTA Ao Asc diam: 2.60 cm MV E velocity: 73.50 cm/s MV A velocity: 121.00 cm/s  SHUNTS MV E/A ratio:  0.61         Systemic VTI:  0.26 m                             Systemic Diam: 1.70 cm Kirk Ruths MD Electronically signed by Kirk Ruths MD Signature Date/Time: 09/02/2020/12:26:37 PM    Final    US Abdomen Limited RUQ (LIVER/GB)  Result Date: 09/09/2020 CLINICAL DATA:  Transaminitis EXAM: ULTRASOUND ABDOMEN LIMITED RIGHT UPPER QUADRANT COMPARISON:  None. FINDINGS: Gallbladder: Gallbladder contains some echogenic biliary sludge as well as several slightly more echogenic foci which  could reflect some small admixed gallstones (8/33). Borderline gallbladder wall thickening at 4 mm. No pericholecystic fluid or inflammation. Sonographic Percell Miller sign was reportedly negative by sonographer. Common bile duct: Diameter: Poorly visualized. Liver: No focal lesion identified. Within normal limits in parenchymal echogenicity. No intrahepatic biliary ductal dilatation. Challenging visualization of the portal vein. Included portions of the portal vein is patent on color Doppler imaging with normal direction of blood flow towards the liver. Other: COVID positive patient with a technically challenging exam due to a seated positioning and inability to perform decubitus positioning. IMPRESSION: 1. Technically challenging exam due to patient's inability to perform decubitus positioning. 2. Biliary sludge with possible admixed gallstones and borderline gallbladder wall thickening albeit with a negative sonographic Murphy sign absent pericholecystic fluid. Findings are equivocal for acute cholecystitis though should be considered in the appropriate clinical context. If there is persisting clinical concern, HIDA could be obtained. 3. No intrahepatic biliary ductal dilatation. Challenging visualization of the common bile duct. Electronically Signed   By: Lovena Le M.D.   On: 09/09/2020 21:41

## 2020-09-12 ENCOUNTER — Inpatient Hospital Stay (HOSPITAL_COMMUNITY): Payer: HMO

## 2020-09-12 DIAGNOSIS — J9601 Acute respiratory failure with hypoxia: Secondary | ICD-10-CM | POA: Diagnosis not present

## 2020-09-12 DIAGNOSIS — U071 COVID-19: Secondary | ICD-10-CM | POA: Diagnosis not present

## 2020-09-12 LAB — COMPREHENSIVE METABOLIC PANEL
ALT: 116 U/L — ABNORMAL HIGH (ref 0–44)
AST: 27 U/L (ref 15–41)
Albumin: 3 g/dL — ABNORMAL LOW (ref 3.5–5.0)
Alkaline Phosphatase: 203 U/L — ABNORMAL HIGH (ref 38–126)
Anion gap: 10 (ref 5–15)
BUN: 38 mg/dL — ABNORMAL HIGH (ref 8–23)
CO2: 25 mmol/L (ref 22–32)
Calcium: 9.8 mg/dL (ref 8.9–10.3)
Chloride: 103 mmol/L (ref 98–111)
Creatinine, Ser: 1.93 mg/dL — ABNORMAL HIGH (ref 0.44–1.00)
GFR, Estimated: 28 mL/min — ABNORMAL LOW (ref >60)
Glucose, Bld: 123 mg/dL — ABNORMAL HIGH (ref 70–99)
Potassium: 4.1 mmol/L (ref 3.5–5.1)
Sodium: 138 mmol/L (ref 135–145)
Total Bilirubin: 1.4 mg/dL — ABNORMAL HIGH (ref 0.3–1.2)
Total Protein: 5.7 g/dL — ABNORMAL LOW (ref 6.5–8.1)

## 2020-09-12 LAB — C-REACTIVE PROTEIN: CRP: 0.6 mg/dL (ref <1.0)

## 2020-09-12 LAB — CBC WITH DIFFERENTIAL/PLATELET
Abs Immature Granulocytes: 0.06 10*3/uL (ref 0.00–0.07)
Basophils Absolute: 0 10*3/uL (ref 0.0–0.1)
Basophils Relative: 0 %
Eosinophils Absolute: 0 10*3/uL (ref 0.0–0.5)
Eosinophils Relative: 1 %
HCT: 26 % — ABNORMAL LOW (ref 36.0–46.0)
Hemoglobin: 8.6 g/dL — ABNORMAL LOW (ref 12.0–15.0)
Immature Granulocytes: 1 %
Lymphocytes Relative: 3 %
Lymphs Abs: 0.3 10*3/uL — ABNORMAL LOW (ref 0.7–4.0)
MCH: 27 pg (ref 26.0–34.0)
MCHC: 33.1 g/dL (ref 30.0–36.0)
MCV: 81.8 fL (ref 80.0–100.0)
Monocytes Absolute: 0.3 10*3/uL (ref 0.1–1.0)
Monocytes Relative: 3 %
Neutro Abs: 7.7 10*3/uL (ref 1.7–7.7)
Neutrophils Relative %: 92 %
Platelets: 91 10*3/uL — ABNORMAL LOW (ref 150–400)
RBC: 3.18 MIL/uL — ABNORMAL LOW (ref 3.87–5.11)
RDW: 19.9 % — ABNORMAL HIGH (ref 11.5–15.5)
WBC: 8.3 10*3/uL (ref 4.0–10.5)
nRBC: 0 % (ref 0.0–0.2)

## 2020-09-12 LAB — D-DIMER, QUANTITATIVE: D-Dimer, Quant: 0.41 ug/mL-FEU (ref 0.00–0.50)

## 2020-09-12 LAB — TYPE AND SCREEN
ABO/RH(D): A POS
Antibody Screen: NEGATIVE
Unit division: 0

## 2020-09-12 LAB — BPAM RBC
Blood Product Expiration Date: 202202072359
ISSUE DATE / TIME: 202202030759
Unit Type and Rh: 600

## 2020-09-12 LAB — GLUCOSE, CAPILLARY
Glucose-Capillary: 113 mg/dL — ABNORMAL HIGH (ref 70–99)
Glucose-Capillary: 265 mg/dL — ABNORMAL HIGH (ref 70–99)
Glucose-Capillary: 250 mg/dL — ABNORMAL HIGH (ref 70–99)
Glucose-Capillary: 271 mg/dL — ABNORMAL HIGH (ref 70–99)

## 2020-09-12 LAB — BRAIN NATRIURETIC PEPTIDE: B Natriuretic Peptide: 80.1 pg/mL (ref 0.0–100.0)

## 2020-09-12 LAB — PROTIME-INR
INR: 1.1 (ref 0.8–1.2)
Prothrombin Time: 13.9 seconds (ref 11.4–15.2)

## 2020-09-12 LAB — PROCALCITONIN: Procalcitonin: 0.1 ng/mL

## 2020-09-12 MED ORDER — FUROSEMIDE 10 MG/ML IJ SOLN
20.0000 mg | Freq: Once | INTRAMUSCULAR | Status: AC
  Administered 2020-09-12: 20 mg via INTRAVENOUS
  Filled 2020-09-12: qty 2

## 2020-09-12 MED ORDER — TACROLIMUS 1 MG PO CAPS
3.0000 mg | ORAL_CAPSULE | Freq: Every evening | ORAL | Status: DC
  Administered 2020-09-12 – 2020-10-13 (×33): 3 mg via ORAL
  Filled 2020-09-12 (×34): qty 3

## 2020-09-12 MED ORDER — TACROLIMUS 1 MG PO CAPS
4.0000 mg | ORAL_CAPSULE | Freq: Every day | ORAL | Status: DC
  Administered 2020-09-12 – 2020-10-13 (×32): 4 mg via ORAL
  Filled 2020-09-12 (×36): qty 4

## 2020-09-12 MED ORDER — MYCOPHENOLATE SODIUM 180 MG PO TBEC
360.0000 mg | DELAYED_RELEASE_TABLET | Freq: Two times a day (BID) | ORAL | Status: DC
  Administered 2020-09-13 – 2020-09-23 (×22): 360 mg via ORAL
  Filled 2020-09-12 (×23): qty 2

## 2020-09-12 NOTE — Progress Notes (Signed)
Physical Therapy Treatment Patient Details Name: [name] [name] MRN: [id number] DOB: 08/25/1955 Today's Date: 09/12/2020    History of Present Illness Pt is a 65 y.o. female who tested (+) COVID-19 on 08/13/20, now admitted 08/18/20 with worsening SOB, cough, chest pain and body aches. Workup for acute hypoxic respiratory failure due to COVID-19 PNA, DKA, AKI on CKD, UTI. PMH includes HTN, DM2, chronic steroid use, s/p renal transplant x2.Episode of CP 1/22; per Cardiology: troponin, EKG and history felt concerning for [place] / possibly NSTEMI - difficult to know in context of multiple medical stressors    PT Comments    Pt sitting up in chair, smiled on entry. Pt reports that she has been able to get up to [facility] and recliner more easily today. Focus of session breathing techniques to use with movement to decrease duration and level of oxygen desaturation. Pt on 20 L O2 via Central on entry with SaO2 95%O2 with HR 105bpm. Prior to movement utilized flutter valve and incentive spirometer. Pt very interested understanding what the breathing tools due and how that will help her. With standing for 10 sec SaO2 dropped to 82%O2 and HR increased to 123bpm. Pt requires ~5 minutes to recover, worked with positioning and purse lip breathing to recover back to 92%O2. Requested pt work on purse lip breathing while sitting up in recliner. D/c plans remain appropriate at this time. PT will continue to follow acutely.   Follow Up Recommendations  SNF     Equipment Recommendations  Rolling walker with 5" wheels;3in1 (PT)    Recommendations for Other Services       Precautions / Restrictions Precautions Precautions: Fall Precaution Comments: Watch SpO2 - quick to desaturate on HHFNC Restrictions Weight Bearing Restrictions: No    Mobility  Bed Mobility               General bed mobility comments: OOB in recliner on entry  Transfers Overall transfer level: Needs assistance   Transfers: Sit  to/from Stand Sit to Stand: Min guard         General transfer comment: min guard for power up and steadying at RW, only able to stand 10 sec before sitting due to DoE        Balance Overall balance assessment: Needs assistance Sitting-balance support: Feet supported;No upper extremity supported;Single extremity supported;Bilateral upper extremity supported Sitting balance-Leahy Scale: Fair     Standing balance support: Single extremity supported Standing balance-Leahy Scale: Poor Standing balance comment: requires single UE support once in standing                            Cognition Arousal/Alertness: Awake/alert Behavior During Therapy: WFL for tasks assessed/performed Overall Cognitive Status: Within Functional Limits for tasks assessed                                 General Comments: smile on entry, asking lots of questions about breathing      Exercises Other Exercises Other Exercises: IS x5 Other Exercises: flutter x 5 -increased coughing    General Comments General comments (skin integrity, edema, etc.): Pt on 20L HHFNC 100%FiO2, dropped to      Pertinent Vitals/Pain Pain Assessment: No/denies pain           PT Goals (current goals can now be found in the care plan section) Acute Rehab PT Goals Patient Stated Goal: to  understand what is going on PT Goal Formulation: With patient Time For Goal Achievement: 09/05/20 Potential to Achieve Goals: Good Progress towards PT goals: Progressing toward goals    Frequency    Min 2X/week      PT Plan Current plan remains appropriate       AM-PAC PT "6 Clicks" Mobility   Outcome Measure  Help needed turning from your back to your side while in a flat bed without using bedrails?: A Little Help needed moving from lying on your back to sitting on the side of a flat bed without using bedrails?: A Little Help needed moving to and from a bed to a chair (including a wheelchair)?: A  Little Help needed standing up from a chair using your arms (e.g., wheelchair or bedside chair)?: A Little Help needed to walk in hospital room?: A Lot Help needed climbing 3-5 steps with a railing? : A Lot 6 Click Score: 16    End of Session Equipment Utilized During Treatment: Oxygen Activity Tolerance: Patient limited by fatigue;Other (comment) (increased O2 demand) Patient left: with call bell/phone within reach;in bed;with bed alarm set Nurse Communication: Mobility status PT Visit Diagnosis: Other abnormalities of gait and mobility (R26.89);Muscle weakness (generalized) (M62.81)     Time: 1595-3967 PT Time Calculation (min) (ACUTE ONLY): 39 min  Charges:  $Therapeutic Activity: 38-52 mins                     Jaymere Alen B. Migdalia Dk PT, DPT Acute Rehabilitation Services Pager [phone] Office [phone]    [place] 09/12/2020, 4:44 PM

## 2020-09-12 NOTE — Progress Notes (Signed)
Report given to 4E RN. Awaiting room to be zapped to transfer.

## 2020-09-12 NOTE — Progress Notes (Signed)
PROGRESS NOTE                                                                                                                                                                                                             Patient Demographics:    [name] [name], is a 65 y.o. female, DOB - [birth year], ZRA:[id number]  Outpatient Primary MD for the patient is Glendale Chard, MD   Admit date - 08/18/2020   LOS - 24  Chief Complaint  Patient presents with  . Covid Positive  . Chest Pain  . Weakness       Brief Narrative: Patient is a 65 y.o. female with PMHx of renal transplant x2 (latest in 2016) on chronic immunosuppressive agents, DM-2, hypothyroidism-who presented with shortness of breath-found to have acute hypoxic respiratory failure due to COVID-19 pneumonia.  Hospital course complicated by slow improvement from severe hypoxemia, AKI and non-STEMI.  COVID-19 vaccinated status: Vaccinated including booster  Significant Events: 1/10>> Admit to [facility] for acute hypoxic respiratory failure due to COVID-19 pneumonia 1/11>> transfer to ICU due to worsening hypoxemia, AKI/metabolic acidosis/hyperkalemia. 1/14>> transferred to [facility] 1/23>> chest pain-non-STEMI-cardiology consult 1/25>> chest pain-resolved by SL nitroglycerin x1 1/28>> tachypneic- worsening hypoxemia-now on 15 L via NRB and 15 L via HFNC  Significant studies: 1/10>> chest x-ray: Severe multilobar bilateral pneumonia. 1/11>> ultrasound renal transplant: No hydronephrosis 1/16>> CT chest: Multifocal viral pneumonia 1/26>>Echo: EF> 70%, LVH, grade 1 diastolic dysfunction 5/45>> chest x-ray: Progression of diffuse bilateral pulmonary opacitie   COVID-19 medications: Steroids: 1/10>> Remdesivir: 1/10>>1/15 Baricitinib: 1/16>>  Antibiotics: Rocephin: 1/12>> 1/18 Cefepime: 1/28>> Vancomycin: 1/28>>  Microbiology data: 1/10 >>blood culture: No  growth 1/11>> urine culture: Klebsiella pneumoniae  Procedures: None  Consults: PCCM, nephrology, cardiology  DVT prophylaxis: enoxaparin (LOVENOX) injection 30 mg Start: 09/03/20 1400 SCDs Start: 08/19/20 0933    Subjective:   Patient in bed, appears comfortable, denies any headache, no fever, no chest pain or pressure, no shortness of breath , no abdominal pain. No focal weakness.   Assessment  & Plan :   Acute Hypoxic Resp Failure due to Covid 19 Viral pneumonia: she has been fully vaccinated however she was immunocompromised due to her underlying history of renal transplant and being on immunosuppressive medications.  She developed severe parenchymal lung injury due to COVID -19 pneumonia/ARDS, has been treated with IV  steroids, Baricitinib and Remdesivir combination along with as needed Lasix.  She has also been given a trial of IV antibiotics for any superimposed bacterial component to her pneumonia  Despite aggressive measures she remains severely hypoxic, detailed discussion between treatment team and family has been done by previous MD and me.  Goal of care is gentle medical treatment to continue, if significant decline or worsening focus on comfort measures.   Although she has leukocytosis I think this is largely due to steroids, her procalcitonin has been undetectable, I will at this time stop antibiotics on 09/07/2020, no productive cough or focal infiltrate on x-ray.  Will continue Solu-Medrol taper, she has finished Remdesivir and Baricitinib.    Continue heated high flow with supplemental nonrebreather mask upon increase activity and oxygen demand, will try and keep her on 1 source if possible for majority of the time to know exactly what her oxygen requirements are.  If any clinical worsening or discomfort full comfort care will be initiated, I have discussed this with daughter personally on 09/06/2020 & 09/12/20.  Prone/Incentive Spirometry: encouraged  incentive spirometry  use 3-4/hour.  O2 requirements:   SpO2: 94 % O2 Flow Rate (L/min): 30 L/min FiO2 (%): 100 %     Recent Labs  Lab 09/08/20 0307 09/09/20 0136 09/10/20 0318 09/11/20 0155 09/12/20 0113  WBC 14.1* 15.4* 10.0 8.2 8.3  HGB 7.4* 7.1* 7.2* 7.2* 8.6*  HCT 21.8* 21.1* 20.9* 21.0* 26.0*  PLT 101* 99* 92* 88* 91*  CRP 0.7 0.6 0.6 0.7 0.6  BNP 136.7* 113.1* 91.9 86.8 80.1  DDIMER 0.30 0.42 0.37 0.30 0.41  PROCALCITON <0.10 0.13 <0.10 <0.10 0.10  AST 65* 110* 55* 38 27  ALT 151* 245* 181* 149* 116*  ALKPHOS 185* 233* 215* 208* 203*  BILITOT 1.1 1.3* 1.1 0.8 1.4*  ALBUMIN 2.9* 2.8* 2.9* 3.0* 3.0*  INR  --   --  1.2 1.2 1.1     Sinus pause: Occurred on 1/20-approximately 4.25 seconds-this occurred while she was sleeping-after discussion with cardiology-beta-blocker discontinued.  Needs outpatient polysomnography.  Non-STEMI: Developed severe chest pain on 1/23 with EKG changes-relieved with nitroglycerin and morphine infusion-chest pain reoccurred on 1/25.  Echo is reassuring with preserved EF.  On aspirin/statin-has completed the 48 hours of IV heparin.  Cardiology has signed off.  AKI on CKD stage IV-transplanted kidney (second renal transplant-01/2015): AKI hemodynamically mediated-creatinine has overall improved-and plateaued to around 1.9 range-but lately has started to trend up. Nephrology reconsulted-Myfortic remains on hold-on Prograf and steroids-Prograf levels pending.  Nephrology following, has developed some crackles on 09/08/2020 with hyperkalemia, will give low-dose Lasix x 1 oral on 09/08/2020.  Hyperkalemia/anion gap metabolic acidosis: Secondary to AKI-has resolved.  Transaminitis: LFTs have spiked a bit on 09/09/2020, hold statin, right upper quadrant ultrasound is nonacute with some sludge and gallstones but no signs of Murphy sign or BD obstruction, negative acute hepatitis panel.  Patient is asymptomatic, LFT trend is now coming down we will continue to monitor.    Anemia:  Combination of anemia of chronic disease and critical illness, no signs of active bleeding, unremarkable anemia panel.  S/p 1 unit of packed RBC transfusion on 09/11/2020 with stable posttransfusion H&H.  Thrombocytopenia: Mild-stable-gradually improving-watch closely.   Complicated UTI: Completed a course of Rocephin.  Afebrile and nontoxic-appearing.  Hypothyroidism: Continue levothyroxine-given suppressed TSH-I have decreased dosage to 50 mcg-repeat TSH in 3 months.  HTN: BP stable-continue manidipine and hydralazine.   HLD: Continue statin.  Obesity: BMI of 34 follow  with PCP post discharge for weight loss.   DKA: Resolved-no longer on insulin drip.  DM-2 (A1c 8.2 on 1/11) with uncontrolled hyperglycemia due to steroids: Levemir and sliding scale dose adjusted on 09/10/2020 with rapidly decreasing steroids.   Lab Results  Component Value Date   HGBA1C 8.2 (H) 08/19/2020   CBG (last 3)  Recent Labs    09/11/20 1757 09/11/20 2042 09/12/20 0750  GLUCAP 266* 188* 113*    RN pressure injury documentation: Pressure Injury 08/20/20 Coccyx Mid Stage 1 -  Intact skin with non-blanchable redness of a localized area usually over a bony prominence. (Active)  08/20/20 0800  Location: Coccyx  Location Orientation: Mid  Staging: Stage 1 -  Intact skin with non-blanchable redness of a localized area usually over a bony prominence.  Wound Description (Comments):   Present on Admission: Yes    GI prophylaxis : PPI  Condition - Extremely Guarded  Family Communication :  Daughter Mendel Ryder [phone] updated on 09/04/20, 09/06/20, 09/09/2020, 09/12/20   Code Status :  Full Code  Diet :  Diet Order            Diet Carb Modified Fluid consistency: Thin; Room service appropriate? Yes  Diet effective now                  Disposition Plan  :  Status is: Inpatient  Remains inpatient appropriate because:Inpatient level of care appropriate due to severity of illness  Dispo: The patient  is from: Home              Anticipated d/c is to: SNF              Anticipated d/c date is: > 3 days              Patient currently is not medically stable to d/c.   Barriers to discharge: Worsening hypoxia requiring O2 supplementation  Antimicorbials  :    Anti-infectives (From admission, onward)   Start     Dose/Rate Route Frequency Ordered Stop   09/07/20 1030  vancomycin (VANCOCIN) IVPB 1000 mg/200 mL premix  Status:  Discontinued        1,000 mg 200 mL/hr over 60 Minutes Intravenous Every 48 hours 09/05/20 0933 09/07/20 0951   09/05/20 1030  vancomycin (VANCOREADY) IVPB 2000 mg/400 mL        2,000 mg 200 mL/hr over 120 Minutes Intravenous  Once 09/05/20 0933 09/05/20 2018   09/05/20 1030  ceFEPIme (MAXIPIME) 2 g in sodium chloride 0.9 % 100 mL IVPB  Status:  Discontinued        2 g 200 mL/hr over 30 Minutes Intravenous Every 24 hours 09/05/20 0933 09/07/20 0951   08/20/20 1445  cefTRIAXone (ROCEPHIN) 1 g in sodium chloride 0.9 % 100 mL IVPB        1 g 200 mL/hr over 30 Minutes Intravenous Every 24 hours 08/20/20 1348 08/26/20 2147   08/20/20 1000  remdesivir 100 mg in sodium chloride 0.9 % 100 mL IVPB       "Followed by" Linked Group Details   100 mg 200 mL/hr over 30 Minutes Intravenous Daily 08/19/20 0021 08/23/20 1113   08/19/20 0130  remdesivir 200 mg in sodium chloride 0.9% 250 mL IVPB       "Followed by" Linked Group Details   200 mg 580 mL/hr over 30 Minutes Intravenous Once 08/19/20 0021 08/19/20 0314      Inpatient Medications   Scheduled Meds: . amLODipine  10  mg Oral Daily  . vitamin C  500 mg Oral Daily  . aspirin EC  81 mg Oral Daily  . darbepoetin (ARANESP) injection - NON-DIALYSIS  150 mcg Subcutaneous Q Tue-1800  . enoxaparin (LOVENOX) injection  30 mg Subcutaneous Q24H  . feeding supplement (NEPRO CARB STEADY)  237 mL Oral BID BM  . fluticasone  1 spray Each Nare Daily  . folic acid  1 mg Oral Daily  . hydrALAZINE  25 mg Oral Q8H  . insulin aspart   0-15 Units Subcutaneous TID WC  . insulin detemir  8 Units Subcutaneous BID  . isosorbide mononitrate  60 mg Oral Daily  . levothyroxine  50 mcg Oral Q0600  . LORazepam  1 mg Oral BID  . mouth rinse  15 mL Mouth Rinse BID  . methylPREDNISolone (SOLU-MEDROL) injection  20 mg Intravenous Daily  . pantoprazole  40 mg Oral BID  . zinc sulfate  220 mg Oral Daily   Continuous Infusions: . sodium chloride    . sodium chloride Stopped (08/31/20 1545)   PRN Meds:.sodium chloride, acetaminophen **OR** acetaminophen, albuterol, guaiFENesin, hydrALAZINE, loperamide, ondansetron (ZOFRAN) IV, polyethylene glycol, sodium chloride   Time Spent in minutes  45   The patient is critically ill with multiple organ system failure and requires high complexity decision making for assessment and support, frequent evaluation and titration of therapies, advanced monitoring, review of radiographic studies and interpretation of complex data.    See all Orders from today for further details   Lala Lund M.D on 09/12/2020 at 10:25 AM  To page go to [email or web address] - use universal password  [facility] -  Office  [phone]    Objective:   Vitals:   09/12/20 0400 09/12/20 0548 09/12/20 0600 09/12/20 0700  BP: 128/67  125/71 128/69  Pulse: 97  92 99  Resp: (!) 27  (!) 32 20  Temp: 98.3 F (36.8 C)  98.3 F (36.8 C) 98.6 F (37 C)  TempSrc: Oral  Axillary Axillary  SpO2: 93% 92% 94% 94%  Weight:  86.2 kg    Height:        Wt Readings from Last 3 Encounters:  09/12/20 86.2 kg  05/14/20 92.5 kg  05/14/20 92.9 kg     Intake/Output Summary (Last 24 hours) at 09/12/2020 1025 Last data filed at 09/12/2020 0350 Gross per 24 hour  Intake 1242 ml  Output 2300 ml  Net -1058 ml     Physical Exam  Awake Alert, No new F.N deficits, Normal affect Beecher.AT,PERRAL Supple Neck,No JVD, No cervical lymphadenopathy appriciated.  Symmetrical Chest wall movement, Good air movement bilaterally,  CTAB RRR,No Gallops, Rubs or new Murmurs, No Parasternal Heave +ve B.Sounds, Abd Soft, No tenderness, No organomegaly appriciated, No rebound - guarding or rigidity. No Cyanosis, Clubbing or edema, No new Rash or bruise    Data Review:    CBC Recent Labs  Lab 09/08/20 0307 09/09/20 0136 09/10/20 0318 09/11/20 0155 09/12/20 0113  WBC 14.1* 15.4* 10.0 8.2 8.3  HGB 7.4* 7.1* 7.2* 7.2* 8.6*  HCT 21.8* 21.1* 20.9* 21.0* 26.0*  PLT 101* 99* 92* 88* 91*  MCV 78.1* 79.0* 80.1 80.5 81.8  MCH 26.5 26.6 27.6 27.6 27.0  MCHC 33.9 33.6 34.4 34.3 33.1  RDW 19.0* 19.4* 20.5* 21.1* 19.9*  LYMPHSABS 0.2* 0.3* 0.2* 0.2* 0.3*  MONOABS 0.2 0.4 0.3 0.2 0.3  EOSABS 0.0 0.0 0.0 0.0 0.0  BASOSABS 0.0 0.0 0.0 0.0 0.0    Chemistries  Recent Labs  Lab 09/07/20 0315 09/08/20 0307 09/09/20 0136 09/10/20 0318 09/11/20 0155 09/12/20 0113  NA 140 135 139 138 137 138  K 4.7 5.3* 4.0 4.3 4.6 4.1  CL 103 102 103 105 106 103  CO2 24 23 24 23 24 25   GLUCOSE 182* 250* 111* 81 188* 123*  BUN 49* 55* 59* 50* 47* 38*  CREATININE 2.48* 2.40* 2.46* 2.15* 2.02* 1.93*  CALCIUM 10.0 9.9 9.8 10.0 9.7 9.8  MG 2.3 2.3  --   --   --   --   AST 56* 65* 110* 55* 38 27  ALT 161* 151* 245* 181* 149* 116*  ALKPHOS 172* 185* 233* 215* 208* 203*  BILITOT 1.2 1.1 1.3* 1.1 0.8 1.4*   ------------------------------------------------------------------------------------------------------------------ No results for input(s): CHOL, HDL, LDLCALC, TRIG, CHOLHDL, LDLDIRECT in the last 72 hours.  Lab Results  Component Value Date   HGBA1C 8.2 (H) 08/19/2020   ------------------------------------------------------------------------------------------------------------------ No results for input(s): TSH, T4TOTAL, T3FREE, THYROIDAB in the last 72 hours.  Invalid input(s): FREET3 ------------------------------------------------------------------------------------------------------------------ No results for input(s):  VITAMINB12, FOLATE, FERRITIN, TIBC, IRON, RETICCTPCT in the last 72 hours.  Coagulation profile Recent Labs  Lab 09/10/20 0318 09/11/20 0155 09/12/20 0113  INR 1.2 1.2 1.1    Recent Labs    09/11/20 0155 09/12/20 0113  DDIMER 0.30 0.41    Cardiac Enzymes No results for input(s): CKMB, TROPONINI, MYOGLOBIN in the last 168 hours.  Invalid input(s): CK ------------------------------------------------------------------------------------------------------------------    Component Value Date/Time   BNP 80.1 09/12/2020 0113    Micro Results Recent Results (from the past 240 hour(s))  MRSA PCR Screening     Status: None   Collection Time: 09/07/20  9:37 AM   Specimen: Nasopharyngeal  Result Value Ref Range Status   MRSA by PCR NEGATIVE NEGATIVE Final    Comment:        The GeneXpert MRSA Assay (FDA approved for NASAL specimens only), is one component of a comprehensive MRSA colonization surveillance program. It is not intended to diagnose MRSA infection nor to guide or monitor treatment for MRSA infections. Performed at [facility], [place] [address], [place], [place] [address]     Radiology Reports CT CHEST WO CONTRAST  Result Date: 08/24/2020 CLINICAL DATA:  Severe hypoxia and respiratory failure with COVID-19 infection. EXAM: CT CHEST WITHOUT CONTRAST TECHNIQUE: Multidetector CT imaging of the chest was performed following the standard protocol without IV contrast. COMPARISON:  Chest x-ray 08/18/2020 FINDINGS: Cardiovascular: Mild cardiomegaly. Mild calcification over the mitral valve annulus. Thoracic aorta is normal caliber. Stent over the right subclavian/axillary region. Remaining vascular structures are unremarkable. Mediastinum/Nodes: No evidence of mediastinal or hilar adenopathy. Remaining mediastinal structures are unremarkable. Lungs/Pleura: Lungs are adequately inflated demonstrate moderate bilateral hazy airspace process likely multifocal viral pneumonia  in this COVID-19 positive patient. No evidence of effusion. Airways are otherwise unremarkable. Upper Abdomen: No acute findings. Musculoskeletal: Degenerative change of the spine. Mild anterior wedging of a midthoracic vertebral body likely chronic. IMPRESSION: 1. Moderate bilateral hazy airspace process likely multifocal viral pneumonia in this COVID-19 positive patient. 2. Mild cardiomegaly. 3. Mild anterior wedging of a midthoracic vertebral body likely chronic. Electronically Signed   By: Marin Olp M.D.   On: 08/24/2020 12:37   US Renal Transplant w/Doppler  Result Date: 08/19/2020 CLINICAL DATA:  Acute kidney injury EXAM: ULTRASOUND OF RENAL TRANSPLANT WITH RENAL DOPPLER ULTRASOUND TECHNIQUE: Ultrasound examination of the renal transplant was performed with gray-scale, color and duplex doppler evaluation. COMPARISON:  None. FINDINGS:  Transplant kidney location: Left lower quadrant Transplant Kidney: Renal measurements: 10.8 x 6.2 x 6.5 cm = volume: 262mL. Normal in size and parenchymal echogenicity. No evidence of mass or hydronephrosis. No peri-transplant fluid collection seen. Color flow in the main renal artery:  Yes Color flow in the main renal vein:  Yes Duplex Doppler Evaluation: Main Renal Artery Velocity:  cm/sec Main Renal Artery Resistive Index: 0.99 Venous waveform in main renal vein:  Present Intrarenal resistive index in upper pole:  0.98 (normal 0.6-0.8; equivocal 0.8-0.9; abnormal >= 0.9) Intrarenal resistive index in lower pole: 0.98 (normal 0.6-0.8; equivocal 0.8-0.9; abnormal >= 0.9) Bladder: Normal for degree of bladder distention. Other findings:  None. IMPRESSION: Elevated resistive indices within the left lower quadrant transplant kidney. No hydronephrosis. Electronically Signed   By: Rolm Baptise M.D.   On: 08/19/2020 19:15   DG Chest Port 1 View  Result Date: 09/12/2020 CLINICAL DATA:  Shortness of breath.  Cough and COVID EXAM: PORTABLE CHEST 1 VIEW COMPARISON:  09/10/2020  FINDINGS: Low volume chest with extensive airspace disease. No visible effusion or air leak. Chronic cardiomegaly. Right subclavian stenting. IMPRESSION: Unchanged low volume chest with multifocal pneumonia. Electronically Signed   By: Monte Fantasia M.D.   On: 09/12/2020 08:46   DG Chest Port 1 View  Result Date: 09/10/2020 CLINICAL DATA:  COVID positive EXAM: PORTABLE CHEST 1 VIEW COMPARISON:  09/07/2020 FINDINGS: Diffuse bilateral airspace disease unchanged. No significant pleural effusion. No pneumothorax. Cardiac enlargement.  Right axillary stent. IMPRESSION: Diffuse bilateral airspace disease unchanged from the prior study. Electronically Signed   By: Franchot Gallo M.D.   On: 09/10/2020 09:10   DG Chest Port 1 View  Result Date: 09/07/2020 CLINICAL DATA:  Post renal transplant x 2 most recently 2016, chronic immunosuppressive therapy, type II diabetes mellitus, presents with shortness of breath, acute hypoxic respiratory failure due to COVID-19 pneumonia EXAM: PORTABLE CHEST 1 VIEW COMPARISON:  Portable exam 1005 hours compared to 09/05/2020 FINDINGS: Enlargement of cardiac silhouette. Mediastinal contours and pulmonary vascularity normal. Atherosclerotic calcification aorta. BILATERAL scattered mixed airspace interstitial infiltrates consistent with multifocal pneumonia and COVID-19. No pleural effusion or pneumothorax. Bones demineralized with stent identified at the RIGHT axillary vessels. IMPRESSION: Persistent diffuse BILATERAL pulmonary infiltrates consistent with multifocal pneumonia and COVID-19. Electronically Signed   By: Lavonia Dana M.D.   On: 09/07/2020 10:28   DG CHEST PORT 1 VIEW  Result Date: 08/31/2020 CLINICAL DATA:  65 year old female with shortness of breath. EXAM: PORTABLE CHEST 1 VIEW COMPARISON:  Chest radiograph dated 08/18/2020. FINDINGS: Bilateral confluent and reticular densities, progressed since the prior radiograph and most consistent with multifocal pneumonia. No  pleural effusion pneumothorax. Borderline cardiomegaly. No acute osseous pathology. IMPRESSION: Interval progression of bilateral pulmonary opacities compared to the radiograph of 08/18/2020. Continued follow-up recommended. Electronically Signed   By: Anner Crete M.D.   On: 08/31/2020 21:17   DG Chest Portable 1 View  Result Date: 08/18/2020 CLINICAL DATA:  65 year old female with history of COVID infection. Chest pain. Shortness of breath. EXAM: PORTABLE CHEST 1 VIEW COMPARISON:  Chest x-ray 10/20/2014. FINDINGS: Patchy multifocal airspace consolidation and areas of interstitial prominence are noted throughout the lungs bilaterally, most severe throughout the periphery of the mid to lower left lung, indicative of multilobar bilateral pneumonia. No definite pleural effusions. No evidence of pulmonary edema. No pneumothorax. Heart size is borderline enlarged. Upper mediastinal contours are within normal limits. Atherosclerotic calcifications in the thoracic aorta. Vascular stent projecting over the right subclavian region. IMPRESSION:  1. Severe multilobar bilateral (left greater than right) pneumonia compatible with reported COVID infection. 2. Aortic atherosclerosis. Electronically Signed   By: Vinnie Langton M.D.   On: 08/18/2020 18:02   DG Chest Port 1V same Day  Result Date: 09/05/2020 CLINICAL DATA:  Shortness of breath, COVID positive EXAM: PORTABLE CHEST 1 VIEW COMPARISON:  08/31/2020 FINDINGS: Persistent diffuse bilateral pulmonary opacities. Aeration has decreased since the prior study. No significant pleural effusion. No pneumothorax. Stable cardiomediastinal contours. IMPRESSION: Progression of diffuse bilateral pulmonary opacities. Electronically Signed   By: Macy Mis M.D.   On: 09/05/2020 08:05   ECHOCARDIOGRAM LIMITED  Result Date: 09/02/2020    ECHOCARDIOGRAM LIMITED REPORT   Patient Name:   [facility] Date of Exam: 09/02/2020 Medical Rec #:  [id number]                Height:       62.0 in Accession #:    [id number]              Weight:       194.4 lb Date of Birth:  1956-03-10              BSA:          1.889 m Patient Age:    65 years                BP:           135/66 mmHg Patient Gender: F                       HR:           100 bpm. Exam Location:  Inpatient Procedure: Limited Echo, Limited Color Doppler and Cardiac Doppler Indications:    chest pain  History:        Patient has prior history of Echocardiogram examinations, most                 recent 09/30/2006. Covid; Risk Factors:Diabetes and Hypertension.  Sonographer:    Johny Chess Referring Phys: Poth  1. Left ventricular ejection fraction, by estimation, is >75%. The left ventricle has hyperdynamic function. The left ventricle has no regional wall motion abnormalities. There is moderate left ventricular hypertrophy. Left ventricular diastolic parameters are consistent with Grade I diastolic dysfunction (impaired relaxation).  2. Right ventricular systolic function is normal. The right ventricular size is normal.  3. The mitral valve is normal in structure. No evidence of mitral valve regurgitation. No evidence of mitral stenosis. Moderate mitral annular calcification.  4. The aortic valve is tricuspid. Aortic valve regurgitation is not visualized. Mild aortic valve sclerosis is present, with no evidence of aortic valve stenosis.  5. The inferior vena cava is normal in size with greater than 50% respiratory variability, suggesting right atrial pressure of 3 mmHg. FINDINGS  Left Ventricle: Left ventricular ejection fraction, by estimation, is >75%. The left ventricle has hyperdynamic function. The left ventricle has no regional wall motion abnormalities. The left ventricular internal cavity size was normal in size. There is moderate left ventricular hypertrophy. Left ventricular diastolic parameters are consistent with Grade I diastolic dysfunction (impaired relaxation). Right  Ventricle: The right ventricular size is normal. Right ventricular systolic function is normal. Left Atrium: Left atrial size was normal in size. Right Atrium: Right atrial size was normal in size. Pericardium: There is no evidence of pericardial effusion. Mitral Valve: The mitral valve is normal in structure. Moderate  mitral annular calcification. No evidence of mitral valve stenosis. Tricuspid Valve: The tricuspid valve is normal in structure. Tricuspid valve regurgitation is trivial. No evidence of tricuspid stenosis. Aortic Valve: The aortic valve is tricuspid. Aortic valve regurgitation is not visualized. Mild aortic valve sclerosis is present, with no evidence of aortic valve stenosis. Pulmonic Valve: The pulmonic valve was normal in structure. Pulmonic valve regurgitation is not visualized. No evidence of pulmonic stenosis. Aorta: The aortic root is normal in size and structure. Venous: The inferior vena cava is normal in size with greater than 50% respiratory variability, suggesting right atrial pressure of 3 mmHg. IAS/Shunts: No atrial level shunt detected by color flow Doppler. LEFT VENTRICLE PLAX 2D LVIDd:         3.70 cm  Diastology LVIDs:         2.50 cm  LV e' medial:    5.33 cm/s LV PW:         1.00 cm  LV E/e' medial:  13.8 LV IVS:        0.90 cm  LV e' lateral:   5.66 cm/s LVOT diam:     1.70 cm  LV E/e' lateral: 13.0 LV SV:         59 LV SV Index:   31 LVOT Area:     2.27 cm  IVC IVC diam: 1.30 cm LEFT ATRIUM         Index LA diam:    3.90 cm 2.06 cm/m  AORTIC VALVE LVOT Vmax:   170.00 cm/s LVOT Vmean:  114.000 cm/s LVOT VTI:    0.258 m  AORTA Ao Asc diam: 2.60 cm MV E velocity: 73.50 cm/s MV A velocity: 121.00 cm/s  SHUNTS MV E/A ratio:  0.61         Systemic VTI:  0.26 m                             Systemic Diam: 1.70 cm Kirk Ruths MD Electronically signed by Kirk Ruths MD Signature Date/Time: 09/02/2020/12:26:37 PM    Final    US Abdomen Limited RUQ (LIVER/GB)  Result Date:  09/09/2020 CLINICAL DATA:  Transaminitis EXAM: ULTRASOUND ABDOMEN LIMITED RIGHT UPPER QUADRANT COMPARISON:  None. FINDINGS: Gallbladder: Gallbladder contains some echogenic biliary sludge as well as several slightly more echogenic foci which could reflect some small admixed gallstones (8/33). Borderline gallbladder wall thickening at 4 mm. No pericholecystic fluid or inflammation. Sonographic Percell Miller sign was reportedly negative by sonographer. Common bile duct: Diameter: Poorly visualized. Liver: No focal lesion identified. Within normal limits in parenchymal echogenicity. No intrahepatic biliary ductal dilatation. Challenging visualization of the portal vein. Included portions of the portal vein is patent on color Doppler imaging with normal direction of blood flow towards the liver. Other: COVID positive patient with a technically challenging exam due to a seated positioning and inability to perform decubitus positioning. IMPRESSION: 1. Technically challenging exam due to patient's inability to perform decubitus positioning. 2. Biliary sludge with possible admixed gallstones and borderline gallbladder wall thickening albeit with a negative sonographic Murphy sign absent pericholecystic fluid. Findings are equivocal for acute cholecystitis though should be considered in the appropriate clinical context. If there is persisting clinical concern, HIDA could be obtained. 3. No intrahepatic biliary ductal dilatation. Challenging visualization of the common bile duct. Electronically Signed   By: Lovena Le M.D.   On: 09/09/2020 21:41

## 2020-09-12 NOTE — Progress Notes (Signed)
Red MEWS @ [id number]. Charge nurse aware. No interventions needed. Will continue to monitor patient.

## 2020-09-12 NOTE — Progress Notes (Signed)
Inpatient Diabetes Program Recommendations  AACE/ADA: New Consensus Statement on Inpatient Glycemic Control (2015)  Target Ranges:  Prepandial:   less than 140 mg/dL      Peak postprandial:   less than 180 mg/dL (1-2 hours)      Critically ill patients:  140 - 180 mg/dL   Lab Results  Component Value Date   GLUCAP 265 (H) 09/12/2020   HGBA1C 8.2 (H) 08/19/2020    Review of Glycemic Control Results for CALEESI, KOHL WHITE (MRN [id number]) as of 09/12/2020 12:49  Ref. Range 09/12/2020 07:50 09/12/2020 12:16  Glucose-Capillary Latest Ref Range: 70 - 99 mg/dL 113 (H) 265 (H)    Inpatient Diabetes Program Recommendations:    Steroids tapering; post prandials elevated.  If appropriate might consider meal coverage TID if eats at least 50% of meal.  Will continue to follow while inpatient.  Thank you, Reche Dixon, RN, BSN Diabetes Coordinator Inpatient Diabetes Program [phone] (team pager from 8a-5p)

## 2020-09-12 NOTE — Progress Notes (Signed)
1 mg ativan solution wasted in stericycle. Witnessed by [employer]

## 2020-09-12 NOTE — Progress Notes (Signed)
[facility] Progress Note   65 yo F with hx of renal transplant x2 (latest 2016) on prograf, myfortic, and low-dose prednisone, T2DM, hypothyroidism, and recent diagnosis of COVID-19 presenting with acute hypoxic respiratory failure 2/2 COVID PNA. Also found to be in DKA with worsening AoCKD and hyperkalemia on labs.  Electrolyte disturbances improved.  Patient is slowly improving but creatinine remains consistently over 2.  Assessment:    1. AKI on CKD;  Cr 4 on presentation, recent baseline around 1.7-2.5. Creatinine currently stable at 1.93 with no current indications for dialysis. Bun 50>47>38.  Hold IVF and continue to monitor creatinine daily. 2. H/O DDKT x 2 (latest 2016). Follows w/ Dr. Marval Regal ([facility]) & [facility].  Will restart Prograf at previous dose which was 4 mg in morning, 3 mg at night.  We will plan to restart CellCept tomorrow at outpatient dose of 360 mg twice daily. 3. Chronic immunosuppressionTac/MMF/Pred-as above 4. Hypoxic respiratory failure secondary COVID-19 PNA; acutely worsened 1/28 and broad spectrum antibiotics added and then stopped. Continued hypoxia, was on 30L HFNC yesterday but improved to 20L HFNC earlier today, per chart review it does look like she was bumped up to 50 L high flow nasal cannula 80% FiO2 this morning. 5. DKA, h/o uncontrolled DM2w/ hyperglycemia, resolved 6. Hyperkalemia, intermittent.   No treatment needed today, K+ 4.1 7. Anion Gap Metabolic acidosis, resolved/stable on NaHCO3 8. HTN-  She does not appear volume overloaded this AM. BP range over last 24hours of  125-138/67-76. BP meds include amlodipine 10mg , hydralazine 25mg  q8hr. 9. Anemia- hgb 8.6 today, s/p 1 unit prbc yesterday for symptomatic anemia with hgb of 7.2 at the time.-   [employer] [place] [facility] on [employer].  Plan: -Per documentation there were frank discussions regarding status and recommended discontinuing prograf since 1/30.   Dispo - Code status DNR  with plans for comfort care if patient has worsening status per documentation.   Subjective:    Patient states that she feels much better after getting the blood yesterday.  She states that she feels like her breathing is subjectively better than yesterday.   Objective:   BP 125/71 (BP Location: Right Arm)   Pulse 92   Temp 98.3 F (36.8 C) (Axillary)   Resp (!) 32   Ht 5\' 2"  (1.575 m)   Wt 86.2 kg   SpO2 94%   BMI 34.76 kg/m   Intake/Output Summary (Last 24 hours) at 09/12/2020 0720 Last data filed at 09/12/2020 0350 Gross per 24 hour  Intake 2037 ml  Output 2300 ml  Net -263 ml   Weight change:   Physical Exam:  General: Alert and oriented in no apparent distress Heart: Regular rate and rhythm with no murmurs appreciated Lungs: No increased work of breathing, some crackles noted in bilateral bases, patient with good air movement.  No lower limb edema Skin: Warm and dry   Imaging: DG Chest Port 1 View  Result Date: 09/10/2020 CLINICAL DATA:  COVID positive EXAM: PORTABLE CHEST 1 VIEW COMPARISON:  09/07/2020 FINDINGS: Diffuse bilateral airspace disease unchanged. No significant pleural effusion. No pneumothorax. Cardiac enlargement.  Right axillary stent. IMPRESSION: Diffuse bilateral airspace disease unchanged from the prior study. Electronically Signed   By: Franchot Gallo M.D.   On: 09/10/2020 09:10    Labs: BMET Recent Labs  Lab 09/06/20 0428 09/07/20 0315 09/08/20 0307 09/09/20 0136 09/10/20 0318 09/11/20 0155 09/12/20 0113  NA 139 140 135 139 138 137 138  K 4.3 4.7 5.3*  4.0 4.3 4.6 4.1  CL 103 103 102 103 105 106 103  CO2 23 24 23 24 23 24 25   GLUCOSE 146* 182* 250* 111* 81 188* 123*  BUN 48* 49* 55* 59* 50* 47* 38*  CREATININE 2.46* 2.48* 2.40* 2.46* 2.15* 2.02* 1.93*  CALCIUM 9.9 10.0 9.9 9.8 10.0 9.7 9.8  PHOS 5.5*  --   --   --   --   --   --    CBC Recent Labs  Lab 09/09/20 0136 09/10/20 0318 09/11/20 0155 09/12/20 0113  WBC 15.4* 10.0 8.2  8.3  NEUTROABS 14.5* 9.4* 7.7 7.7  HGB 7.1* 7.2* 7.2* 8.6*  HCT 21.1* 20.9* 21.0* 26.0*  MCV 79.0* 80.1 80.5 81.8  PLT 99* 92* 88* 91*    Medications:    . amLODipine  10 mg Oral Daily  . vitamin C  500 mg Oral Daily  . aspirin EC  81 mg Oral Daily  . darbepoetin (ARANESP) injection - NON-DIALYSIS  150 mcg Subcutaneous Q Tue-1800  . enoxaparin (LOVENOX) injection  30 mg Subcutaneous Q24H  . feeding supplement (NEPRO CARB STEADY)  237 mL Oral BID BM  . fluticasone  1 spray Each Nare Daily  . folic acid  1 mg Oral Daily  . hydrALAZINE  25 mg Oral Q8H  . insulin aspart  0-15 Units Subcutaneous TID WC  . insulin detemir  8 Units Subcutaneous BID  . isosorbide mononitrate  60 mg Oral Daily  . levothyroxine  50 mcg Oral Q0600  . LORazepam  1 mg Oral BID  . mouth rinse  15 mL Mouth Rinse BID  . methylPREDNISolone (SOLU-MEDROL) injection  20 mg Intravenous Daily  . pantoprazole  40 mg Oral BID  . zinc sulfate  220 mg Oral Daily    Lurline Del, DO  09/12/2020, 7:20 AM

## 2020-09-13 DIAGNOSIS — J9601 Acute respiratory failure with hypoxia: Secondary | ICD-10-CM | POA: Diagnosis not present

## 2020-09-13 DIAGNOSIS — U071 COVID-19: Secondary | ICD-10-CM | POA: Diagnosis not present

## 2020-09-13 LAB — CBC
HCT: 26.4 % — ABNORMAL LOW (ref 36.0–46.0)
Hemoglobin: 8.8 g/dL — ABNORMAL LOW (ref 12.0–15.0)
MCH: 27.3 pg (ref 26.0–34.0)
MCHC: 33.3 g/dL (ref 30.0–36.0)
MCV: 82 fL (ref 80.0–100.0)
Platelets: 92 10*3/uL — ABNORMAL LOW (ref 150–400)
RBC: 3.22 MIL/uL — ABNORMAL LOW (ref 3.87–5.11)
RDW: 20.5 % — ABNORMAL HIGH (ref 11.5–15.5)
WBC: 7.1 10*3/uL (ref 4.0–10.5)
nRBC: 0.3 % — ABNORMAL HIGH (ref 0.0–0.2)

## 2020-09-13 LAB — RENAL FUNCTION PANEL
Albumin: 3 g/dL — ABNORMAL LOW (ref 3.5–5.0)
Anion gap: 10 (ref 5–15)
BUN: 37 mg/dL — ABNORMAL HIGH (ref 8–23)
CO2: 24 mmol/L (ref 22–32)
Calcium: 9.4 mg/dL (ref 8.9–10.3)
Chloride: 102 mmol/L (ref 98–111)
Creatinine, Ser: 1.95 mg/dL — ABNORMAL HIGH (ref 0.44–1.00)
GFR, Estimated: 28 mL/min — ABNORMAL LOW (ref >60)
Glucose, Bld: 178 mg/dL — ABNORMAL HIGH (ref 70–99)
Phosphorus: 2.2 mg/dL — ABNORMAL LOW (ref 2.5–4.6)
Potassium: 3.8 mmol/L (ref 3.5–5.1)
Sodium: 136 mmol/L (ref 135–145)

## 2020-09-13 LAB — PROTIME-INR
INR: 1.1 (ref 0.8–1.2)
Prothrombin Time: 13.6 seconds (ref 11.4–15.2)

## 2020-09-13 LAB — GLUCOSE, CAPILLARY
Glucose-Capillary: 101 mg/dL — ABNORMAL HIGH (ref 70–99)
Glucose-Capillary: 201 mg/dL — ABNORMAL HIGH (ref 70–99)
Glucose-Capillary: 289 mg/dL — ABNORMAL HIGH (ref 70–99)
Glucose-Capillary: 298 mg/dL — ABNORMAL HIGH (ref 70–99)

## 2020-09-13 NOTE — Progress Notes (Signed)
PROGRESS NOTE                                                                                                                                                                                                             Patient Demographics:    [name] [name], is a 65 y.o. female, DOB - February 18, 1956, MEQ:[id number]  Outpatient Primary MD for the patient is Glendale Chard, MD   Admit date - 08/18/2020   LOS - 25  Chief Complaint  Patient presents with  . Covid Positive  . Chest Pain  . Weakness       Brief Narrative: Patient is a 65 y.o. female with PMHx of renal transplant x2 (latest in 2016) on chronic immunosuppressive agents, DM-2, hypothyroidism-who presented with shortness of breath-found to have acute hypoxic respiratory failure due to COVID-19 pneumonia.  Hospital course complicated by slow improvement from severe hypoxemia, AKI and non-STEMI.  COVID-19 vaccinated status: Vaccinated including booster  Significant Events: 1/10>> Admit to [facility] for acute hypoxic respiratory failure due to COVID-19 pneumonia 1/11>> transfer to ICU due to worsening hypoxemia, AKI/metabolic acidosis/hyperkalemia. 1/14>> transferred to [facility] 1/23>> chest pain-non-STEMI-cardiology consult 1/25>> chest pain-resolved by SL nitroglycerin x1 1/28>> tachypneic- worsening hypoxemia-now on 15 L via NRB and 15 L via HFNC  Significant studies: 1/10>> chest x-ray: Severe multilobar bilateral pneumonia. 1/11>> ultrasound renal transplant: No hydronephrosis 1/16>> CT chest: Multifocal viral pneumonia 1/26>>Echo: EF> 70%, LVH, grade 1 diastolic dysfunction 2/97>> chest x-ray: Progression of diffuse bilateral pulmonary opacitie   COVID-19 medications: Steroids: 1/10>> Remdesivir: 1/10>>1/15 Baricitinib: 1/16>>1/29  Antibiotics: Rocephin: 1/12>> 1/18 Cefepime: 1/28, 1/29 Vancomycin: 1/28x1  Microbiology data: 1/10 >>blood culture: No  growth 1/11>> urine culture: Klebsiella pneumoniae  Procedures: None  Consults: PCCM, nephrology, cardiology  DVT prophylaxis: enoxaparin (LOVENOX) injection 30 mg Start: 09/03/20 1400 SCDs Start: 08/19/20 0933    Subjective:   Patient in bed, does not appear in distress , she is on heated high flow nasal cannula , no cough , denies pain, no fever  when asked whether she want to get out of bed , she said she would like to stay in bed today.     Assessment  & Plan :   Acute Hypoxic Resp Failure due to Covid 19 Viral pneumonia: she has been fully vaccinated however she was immunocompromised due to her underlying history of  renal transplant and being on immunosuppressive medications.   She developed severe parenchymal lung injury due to COVID -19 pneumonia/ARDS, has been treated with IV steroids(from 1/11 to 2/5), Baricitinib( from 1/16 to 1/29) and Remdesivir combination along with as needed Lasix.  She has also been given a trial of IV antibiotics for any superimposed bacterial component to her pneumonia, procalcitonin has been undetectable.  Despite aggressive measures she remains severely hypoxic, detailed discussion between treatment team and family has been done by previous MD  On several occasions.   Goal of care is gentle medical treatment to continue, if significant decline or worsening focus on comfort measures.    Continue heated high flow with supplemental nonrebreather mask upon increase activity and oxygen demand, will try and keep her on 1 source if possible for majority of the time to know exactly what her oxygen requirements are.  If any clinical worsening or discomfort full comfort care will be initiated, Dr Candiss Norse have discussed this with daughter on 09/06/2020 & 09/12/20.  Prone/Incentive Spirometry: encouraged  incentive spirometry use 3-4/hour.  O2 requirements:   SpO2: 93 % O2 Flow Rate (L/min): 30 L/min FiO2 (%): 60 %     Recent Labs  Lab 09/08/20 0307  09/09/20 0136 09/10/20 0318 09/11/20 0155 09/12/20 0113 09/13/20 0140  WBC 14.1* 15.4* 10.0 8.2 8.3 7.1  HGB 7.4* 7.1* 7.2* 7.2* 8.6* 8.8*  HCT 21.8* 21.1* 20.9* 21.0* 26.0* 26.4*  PLT 101* 99* 92* 88* 91* 92*  CRP 0.7 0.6 0.6 0.7 0.6  --   BNP 136.7* 113.1* 91.9 86.8 80.1  --   DDIMER 0.30 0.42 0.37 0.30 0.41  --   PROCALCITON <0.10 0.13 <0.10 <0.10 0.10  --   AST 65* 110* 55* 38 27  --   ALT 151* 245* 181* 149* 116*  --   ALKPHOS 185* 233* 215* 208* 203*  --   BILITOT 1.1 1.3* 1.1 0.8 1.4*  --   ALBUMIN 2.9* 2.8* 2.9* 3.0* 3.0* 3.0*  INR  --   --  1.2 1.2 1.1 1.1     Sinus pause: Occurred on 1/20-approximately 4.25 seconds-this occurred while she was sleeping-after discussion with cardiology-beta-blocker discontinued.  Needs outpatient polysomnography.  Non-STEMI: Developed severe chest pain on 1/23 with EKG changes-relieved with nitroglycerin and morphine infusion-chest pain reoccurred on 1/25.  Echo is reassuring with preserved EF.  On aspirin/statin-has completed the 48 hours of IV heparin.  Cardiology has signed off.  AKI on CKD stage IV-transplanted kidney (second renal transplant-01/2015): AKI hemodynamically mediated-creatinine has overall improved-and plateaued to around 1.9 range-but lately has started to trend up. Nephrology reconsulted-Myfortic remains on hold-on Prograf and steroids-Prograf levels pending.  Nephrology following, has developed some crackles on 09/08/2020 with hyperkalemia, received low-dose Lasix x 1 oral on 09/08/2020.  Hyperkalemia/anion gap metabolic acidosis: Secondary to AKI-has resolved.  Transaminitis: LFTs have spiked a bit on 09/09/2020, hold statin, right upper quadrant ultrasound is nonacute with some sludge and gallstones but no signs of Murphy sign or BD obstruction, negative acute hepatitis panel.  Patient is asymptomatic, LFT trend is now coming down we will continue to monitor.    Anemia: Combination of anemia of chronic disease and critical  illness, no signs of active bleeding, unremarkable anemia panel.  S/p 1 unit of packed RBC transfusion on 09/11/2020 with stable posttransfusion H&H.  Thrombocytopenia: Mild-stable-gradually improving-watch closely.   Complicated UTI: Completed a course of Rocephin.  Afebrile and nontoxic-appearing.  Hypothyroidism: Continue levothyroxine-given suppressed TSH-I have decreased dosage to 50  mcg-repeat TSH in 3 months.  HTN: BP stable-continue manidipine and hydralazine.   HLD: Continue statin.  Obesity: BMI of 34 follow with PCP post discharge for weight loss.   DKA: Resolved-no longer on insulin drip.  DM-2 (A1c 8.2 on 1/11) with uncontrolled hyperglycemia due to steroids: Levemir and sliding scale dose adjusted on 09/10/2020 with rapidly decreasing steroids.   Lab Results  Component Value Date   HGBA1C 8.2 (H) 08/19/2020   CBG (last 3)  Recent Labs    09/12/20 2001 09/13/20 0616 09/13/20 1134  GLUCAP 271* 101* 201*    RN pressure injury documentation: Pressure Injury 08/20/20 Coccyx Mid Stage 1 -  Intact skin with non-blanchable redness of a localized area usually over a bony prominence. (Active)  08/20/20 0800  Location: Coccyx  Location Orientation: Mid  Staging: Stage 1 -  Intact skin with non-blanchable redness of a localized area usually over a bony prominence.  Wound Description (Comments):   Present on Admission: Yes    GI prophylaxis : PPI  Condition - Extremely Guarded  Family Communication :  Daughter Mendel Ryder [phone] updated on 09/04/20, 09/06/20, 09/09/2020, 09/12/20   Code Status :  DNR  Diet :  Diet Order            Diet Carb Modified Fluid consistency: Thin; Room service appropriate? Yes  Diet effective now                  Disposition Plan  :  Status is: Inpatient  Remains inpatient appropriate because:Inpatient level of care appropriate due to severity of illness  Dispo: The patient is from: Home              Anticipated d/c is to: SNF               Anticipated d/c date is: > 3 days              Patient currently is not medically stable to d/c.   Barriers to discharge: Worsening hypoxia requiring O2 supplementation  Antimicorbials  :    Anti-infectives (From admission, onward)   Start     Dose/Rate Route Frequency Ordered Stop   09/07/20 1030  vancomycin (VANCOCIN) IVPB 1000 mg/200 mL premix  Status:  Discontinued        1,000 mg 200 mL/hr over 60 Minutes Intravenous Every 48 hours 09/05/20 0933 09/07/20 0951   09/05/20 1030  vancomycin (VANCOREADY) IVPB 2000 mg/400 mL        2,000 mg 200 mL/hr over 120 Minutes Intravenous  Once 09/05/20 0933 09/05/20 2018   09/05/20 1030  ceFEPIme (MAXIPIME) 2 g in sodium chloride 0.9 % 100 mL IVPB  Status:  Discontinued        2 g 200 mL/hr over 30 Minutes Intravenous Every 24 hours 09/05/20 0933 09/07/20 0951   08/20/20 1445  cefTRIAXone (ROCEPHIN) 1 g in sodium chloride 0.9 % 100 mL IVPB        1 g 200 mL/hr over 30 Minutes Intravenous Every 24 hours 08/20/20 1348 08/26/20 2147   08/20/20 1000  remdesivir 100 mg in sodium chloride 0.9 % 100 mL IVPB       "Followed by" Linked Group Details   100 mg 200 mL/hr over 30 Minutes Intravenous Daily 08/19/20 0021 08/23/20 1113   08/19/20 0130  remdesivir 200 mg in sodium chloride 0.9% 250 mL IVPB       "Followed by" Linked Group Details   200 mg 580 mL/hr over 30  Minutes Intravenous Once 08/19/20 0021 08/19/20 0314      Inpatient Medications   Scheduled Meds: . amLODipine  10 mg Oral Daily  . vitamin C  500 mg Oral Daily  . aspirin EC  81 mg Oral Daily  . darbepoetin (ARANESP) injection - NON-DIALYSIS  150 mcg Subcutaneous Q Tue-1800  . enoxaparin (LOVENOX) injection  30 mg Subcutaneous Q24H  . feeding supplement (NEPRO CARB STEADY)  237 mL Oral BID BM  . fluticasone  1 spray Each Nare Daily  . folic acid  1 mg Oral Daily  . hydrALAZINE  25 mg Oral Q8H  . insulin aspart  0-15 Units Subcutaneous TID WC  . insulin detemir  8 Units  Subcutaneous BID  . isosorbide mononitrate  60 mg Oral Daily  . levothyroxine  50 mcg Oral Q0600  . LORazepam  1 mg Oral BID  . mouth rinse  15 mL Mouth Rinse BID  . mycophenolate  360 mg Oral BID  . pantoprazole  40 mg Oral BID  . tacrolimus  3 mg Oral QPM  . tacrolimus  4 mg Oral Daily  . zinc sulfate  220 mg Oral Daily   Continuous Infusions: . sodium chloride    . sodium chloride Stopped (08/31/20 1545)   PRN Meds:.sodium chloride, acetaminophen **OR** acetaminophen, albuterol, guaiFENesin, hydrALAZINE, loperamide, ondansetron (ZOFRAN) IV, polyethylene glycol, sodium chloride   Time Spent in minutes  45   The patient is critically ill with multiple organ system failure and requires high complexity decision making for assessment and support, frequent evaluation and titration of therapies, advanced monitoring, review of radiographic studies and interpretation of complex data.    See all Orders from today for further details   Florencia Reasons M.D on 09/13/2020 at 3:02 PM  To page go to [email or web address] - use universal password  [facility] -  Office  [phone]    Objective:   Vitals:   09/13/20 0800 09/13/20 0852 09/13/20 1136 09/13/20 1424  BP: 131/63  (!) 141/73   Pulse: 99 (!) 104 96   Resp: 18 18 20    Temp: 97.9 F (36.6 C)  98.6 F (37 C)   TempSrc: Oral  Oral   SpO2: 92% 94% 96% 93%  Weight:      Height:        Wt Readings from Last 3 Encounters:  09/13/20 85.3 kg  05/14/20 92.5 kg  05/14/20 92.9 kg     Intake/Output Summary (Last 24 hours) at 09/13/2020 1502 Last data filed at 09/13/2020 0840 Gross per 24 hour  Intake 320 ml  Output 0 ml  Net 320 ml     Physical Exam  Awake Alert, No new F.N deficits, Normal affect [place].AT,PERRAL Supple Neck,No JVD, No cervical lymphadenopathy appriciated.  Symmetrical Chest wall movement, Good air movement bilaterally, CTAB RRR,No Gallops, Rubs or new Murmurs, No Parasternal Heave +ve B.Sounds, Abd Soft, No  tenderness, No organomegaly appriciated, No rebound - guarding or rigidity. No Cyanosis, Clubbing or edema, No new Rash or bruise    Data Review:    CBC Recent Labs  Lab 09/08/20 0307 09/09/20 0136 09/10/20 0318 09/11/20 0155 09/12/20 0113 09/13/20 0140  WBC 14.1* 15.4* 10.0 8.2 8.3 7.1  HGB 7.4* 7.1* 7.2* 7.2* 8.6* 8.8*  HCT 21.8* 21.1* 20.9* 21.0* 26.0* 26.4*  PLT 101* 99* 92* 88* 91* 92*  MCV 78.1* 79.0* 80.1 80.5 81.8 82.0  MCH 26.5 26.6 27.6 27.6 27.0 27.3  MCHC 33.9 33.6 34.4 34.3 33.1 33.3  RDW 19.0* 19.4* 20.5* 21.1* 19.9* 20.5*  LYMPHSABS 0.2* 0.3* 0.2* 0.2* 0.3*  --   MONOABS 0.2 0.4 0.3 0.2 0.3  --   EOSABS 0.0 0.0 0.0 0.0 0.0  --   BASOSABS 0.0 0.0 0.0 0.0 0.0  --     Chemistries  Recent Labs  Lab 09/07/20 0315 09/08/20 0307 09/09/20 0136 09/10/20 0318 09/11/20 0155 09/12/20 0113 09/13/20 0140  NA 140 135 139 138 137 138 136  K 4.7 5.3* 4.0 4.3 4.6 4.1 3.8  CL 103 102 103 105 106 103 102  CO2 24 23 24 23 24 25 24   GLUCOSE 182* 250* 111* 81 188* 123* 178*  BUN 49* 55* 59* 50* 47* 38* 37*  CREATININE 2.48* 2.40* 2.46* 2.15* 2.02* 1.93* 1.95*  CALCIUM 10.0 9.9 9.8 10.0 9.7 9.8 9.4  MG 2.3 2.3  --   --   --   --   --   AST 56* 65* 110* 55* 38 27  --   ALT 161* 151* 245* 181* 149* 116*  --   ALKPHOS 172* 185* 233* 215* 208* 203*  --   BILITOT 1.2 1.1 1.3* 1.1 0.8 1.4*  --    ------------------------------------------------------------------------------------------------------------------ No results for input(s): CHOL, HDL, LDLCALC, TRIG, CHOLHDL, LDLDIRECT in the last 72 hours.  Lab Results  Component Value Date   HGBA1C 8.2 (H) 08/19/2020   ------------------------------------------------------------------------------------------------------------------ No results for input(s): TSH, T4TOTAL, T3FREE, THYROIDAB in the last 72 hours.  Invalid input(s):  FREET3 ------------------------------------------------------------------------------------------------------------------ No results for input(s): VITAMINB12, FOLATE, FERRITIN, TIBC, IRON, RETICCTPCT in the last 72 hours.  Coagulation profile Recent Labs  Lab 09/10/20 0318 09/11/20 0155 09/12/20 0113 09/13/20 0140  INR 1.2 1.2 1.1 1.1    Recent Labs    09/11/20 0155 09/12/20 0113  DDIMER 0.30 0.41    Cardiac Enzymes No results for input(s): CKMB, TROPONINI, MYOGLOBIN in the last 168 hours.  Invalid input(s): CK ------------------------------------------------------------------------------------------------------------------    Component Value Date/Time   BNP 80.1 09/12/2020 0113    Micro Results Recent Results (from the past 240 hour(s))  MRSA PCR Screening     Status: None   Collection Time: 09/07/20  9:37 AM   Specimen: Nasopharyngeal  Result Value Ref Range Status   MRSA by PCR NEGATIVE NEGATIVE Final    Comment:        The GeneXpert MRSA Assay (FDA approved for NASAL specimens only), is one component of a comprehensive MRSA colonization surveillance program. It is not intended to diagnose MRSA infection nor to guide or monitor treatment for MRSA infections. Performed at [facility], [place] [address] [place],  [address]     Radiology Reports CT CHEST WO CONTRAST  Result Date: 08/24/2020 CLINICAL DATA:  Severe hypoxia and respiratory failure with COVID-19 infection. EXAM: CT CHEST WITHOUT CONTRAST TECHNIQUE: Multidetector CT imaging of the chest was performed following the standard protocol without IV contrast. COMPARISON:  Chest x-ray 08/18/2020 FINDINGS: Cardiovascular: Mild cardiomegaly. Mild calcification over the mitral valve annulus. Thoracic aorta is normal caliber. Stent over the right subclavian/axillary region. Remaining vascular structures are unremarkable. Mediastinum/Nodes: No evidence of mediastinal or hilar adenopathy. Remaining  mediastinal structures are unremarkable. Lungs/Pleura: Lungs are adequately inflated demonstrate moderate bilateral hazy airspace process likely multifocal viral pneumonia in this COVID-19 positive patient. No evidence of effusion. Airways are otherwise unremarkable. Upper Abdomen: No acute findings. Musculoskeletal: Degenerative change of the spine. Mild anterior wedging of a midthoracic vertebral body likely chronic. IMPRESSION: 1. Moderate bilateral hazy airspace  process likely multifocal viral pneumonia in this COVID-19 positive patient. 2. Mild cardiomegaly. 3. Mild anterior wedging of a midthoracic vertebral body likely chronic. Electronically Signed   By: Marin Olp M.D.   On: 08/24/2020 12:37   US Renal Transplant w/Doppler  Result Date: 08/19/2020 CLINICAL DATA:  Acute kidney injury EXAM: ULTRASOUND OF RENAL TRANSPLANT WITH RENAL DOPPLER ULTRASOUND TECHNIQUE: Ultrasound examination of the renal transplant was performed with gray-scale, color and duplex doppler evaluation. COMPARISON:  None. FINDINGS: Transplant kidney location: Left lower quadrant Transplant Kidney: Renal measurements: 10.8 x 6.2 x 6.5 cm = volume: 274mL. Normal in size and parenchymal echogenicity. No evidence of mass or hydronephrosis. No peri-transplant fluid collection seen. Color flow in the main renal artery:  Yes Color flow in the main renal vein:  Yes Duplex Doppler Evaluation: Main Renal Artery Velocity:  cm/sec Main Renal Artery Resistive Index: 0.99 Venous waveform in main renal vein:  Present Intrarenal resistive index in upper pole:  0.98 (normal 0.6-0.8; equivocal 0.8-0.9; abnormal >= 0.9) Intrarenal resistive index in lower pole: 0.98 (normal 0.6-0.8; equivocal 0.8-0.9; abnormal >= 0.9) Bladder: Normal for degree of bladder distention. Other findings:  None. IMPRESSION: Elevated resistive indices within the left lower quadrant transplant kidney. No hydronephrosis. Electronically Signed   By: Rolm Baptise M.D.   On:  08/19/2020 19:15   DG Chest Port 1 View  Result Date: 09/12/2020 CLINICAL DATA:  Shortness of breath.  Cough and COVID EXAM: PORTABLE CHEST 1 VIEW COMPARISON:  09/10/2020 FINDINGS: Low volume chest with extensive airspace disease. No visible effusion or air leak. Chronic cardiomegaly. Right subclavian stenting. IMPRESSION: Unchanged low volume chest with multifocal pneumonia. Electronically Signed   By: Monte Fantasia M.D.   On: 09/12/2020 08:46   DG Chest Port 1 View  Result Date: 09/10/2020 CLINICAL DATA:  COVID positive EXAM: PORTABLE CHEST 1 VIEW COMPARISON:  09/07/2020 FINDINGS: Diffuse bilateral airspace disease unchanged. No significant pleural effusion. No pneumothorax. Cardiac enlargement.  Right axillary stent. IMPRESSION: Diffuse bilateral airspace disease unchanged from the prior study. Electronically Signed   By: Franchot Gallo M.D.   On: 09/10/2020 09:10   DG Chest Port 1 View  Result Date: 09/07/2020 CLINICAL DATA:  Post renal transplant x 2 most recently 2016, chronic immunosuppressive therapy, type II diabetes mellitus, presents with shortness of breath, acute hypoxic respiratory failure due to COVID-19 pneumonia EXAM: PORTABLE CHEST 1 VIEW COMPARISON:  Portable exam 1005 hours compared to 09/05/2020 FINDINGS: Enlargement of cardiac silhouette. Mediastinal contours and pulmonary vascularity normal. Atherosclerotic calcification aorta. BILATERAL scattered mixed airspace interstitial infiltrates consistent with multifocal pneumonia and COVID-19. No pleural effusion or pneumothorax. Bones demineralized with stent identified at the RIGHT axillary vessels. IMPRESSION: Persistent diffuse BILATERAL pulmonary infiltrates consistent with multifocal pneumonia and COVID-19. Electronically Signed   By: Lavonia Dana M.D.   On: 09/07/2020 10:28   DG CHEST PORT 1 VIEW  Result Date: 08/31/2020 CLINICAL DATA:  65 year old female with shortness of breath. EXAM: PORTABLE CHEST 1 VIEW COMPARISON:  Chest  radiograph dated 08/18/2020. FINDINGS: Bilateral confluent and reticular densities, progressed since the prior radiograph and most consistent with multifocal pneumonia. No pleural effusion pneumothorax. Borderline cardiomegaly. No acute osseous pathology. IMPRESSION: Interval progression of bilateral pulmonary opacities compared to the radiograph of 08/18/2020. Continued follow-up recommended. Electronically Signed   By: Anner Crete M.D.   On: 08/31/2020 21:17   DG Chest Portable 1 View  Result Date: 08/18/2020 CLINICAL DATA:  65 year old female with history of COVID infection. Chest pain. Shortness  of breath. EXAM: PORTABLE CHEST 1 VIEW COMPARISON:  Chest x-ray 10/20/2014. FINDINGS: Patchy multifocal airspace consolidation and areas of interstitial prominence are noted throughout the lungs bilaterally, most severe throughout the periphery of the mid to lower left lung, indicative of multilobar bilateral pneumonia. No definite pleural effusions. No evidence of pulmonary edema. No pneumothorax. Heart size is borderline enlarged. Upper mediastinal contours are within normal limits. Atherosclerotic calcifications in the thoracic aorta. Vascular stent projecting over the right subclavian region. IMPRESSION: 1. Severe multilobar bilateral (left greater than right) pneumonia compatible with reported COVID infection. 2. Aortic atherosclerosis. Electronically Signed   By: Vinnie Langton M.D.   On: 08/18/2020 18:02   DG Chest Port 1V same Day  Result Date: 09/05/2020 CLINICAL DATA:  Shortness of breath, COVID positive EXAM: PORTABLE CHEST 1 VIEW COMPARISON:  08/31/2020 FINDINGS: Persistent diffuse bilateral pulmonary opacities. Aeration has decreased since the prior study. No significant pleural effusion. No pneumothorax. Stable cardiomediastinal contours. IMPRESSION: Progression of diffuse bilateral pulmonary opacities. Electronically Signed   By: Macy Mis M.D.   On: 09/05/2020 08:05   ECHOCARDIOGRAM  LIMITED  Result Date: 09/02/2020    ECHOCARDIOGRAM LIMITED REPORT   Patient Name:   [facility] Date of Exam: 09/02/2020 Medical Rec #:  [id number]               Height:       62.0 in Accession #:    [id number]              Weight:       194.4 lb Date of Birth:  Aug 28, 1955              BSA:          1.889 m Patient Age:    38 years                BP:           135/66 mmHg Patient Gender: F                       HR:           100 bpm. Exam Location:  Inpatient Procedure: Limited Echo, Limited Color Doppler and Cardiac Doppler Indications:    chest pain  History:        Patient has prior history of Echocardiogram examinations, most                 recent 09/30/2006. Covid; Risk Factors:Diabetes and Hypertension.  Sonographer:    Johny Chess Referring Phys: [place]  1. Left ventricular ejection fraction, by estimation, is >75%. The left ventricle has hyperdynamic function. The left ventricle has no regional wall motion abnormalities. There is moderate left ventricular hypertrophy. Left ventricular diastolic parameters are consistent with Grade I diastolic dysfunction (impaired relaxation).  2. Right ventricular systolic function is normal. The right ventricular size is normal.  3. The mitral valve is normal in structure. No evidence of mitral valve regurgitation. No evidence of mitral stenosis. Moderate mitral annular calcification.  4. The aortic valve is tricuspid. Aortic valve regurgitation is not visualized. Mild aortic valve sclerosis is present, with no evidence of aortic valve stenosis.  5. The inferior vena cava is normal in size with greater than 50% respiratory variability, suggesting right atrial pressure of 3 mmHg. FINDINGS  Left Ventricle: Left ventricular ejection fraction, by estimation, is >75%. The left ventricle has hyperdynamic function. The left ventricle has no regional  wall motion abnormalities. The left ventricular internal cavity size was normal in  size. There is moderate left ventricular hypertrophy. Left ventricular diastolic parameters are consistent with Grade I diastolic dysfunction (impaired relaxation). Right Ventricle: The right ventricular size is normal. Right ventricular systolic function is normal. Left Atrium: Left atrial size was normal in size. Right Atrium: Right atrial size was normal in size. Pericardium: There is no evidence of pericardial effusion. Mitral Valve: The mitral valve is normal in structure. Moderate mitral annular calcification. No evidence of mitral valve stenosis. Tricuspid Valve: The tricuspid valve is normal in structure. Tricuspid valve regurgitation is trivial. No evidence of tricuspid stenosis. Aortic Valve: The aortic valve is tricuspid. Aortic valve regurgitation is not visualized. Mild aortic valve sclerosis is present, with no evidence of aortic valve stenosis. Pulmonic Valve: The pulmonic valve was normal in structure. Pulmonic valve regurgitation is not visualized. No evidence of pulmonic stenosis. Aorta: The aortic root is normal in size and structure. Venous: The inferior vena cava is normal in size with greater than 50% respiratory variability, suggesting right atrial pressure of 3 mmHg. IAS/Shunts: No atrial level shunt detected by color flow Doppler. LEFT VENTRICLE PLAX 2D LVIDd:         3.70 cm  Diastology LVIDs:         2.50 cm  LV e' medial:    5.33 cm/s LV PW:         1.00 cm  LV E/e' medial:  13.8 LV IVS:        0.90 cm  LV e' lateral:   5.66 cm/s LVOT diam:     1.70 cm  LV E/e' lateral: 13.0 LV SV:         59 LV SV Index:   31 LVOT Area:     2.27 cm  IVC IVC diam: 1.30 cm LEFT ATRIUM         Index LA diam:    3.90 cm 2.06 cm/m  AORTIC VALVE LVOT Vmax:   170.00 cm/s LVOT Vmean:  114.000 cm/s LVOT VTI:    0.258 m  AORTA Ao Asc diam: 2.60 cm MV E velocity: 73.50 cm/s MV A velocity: 121.00 cm/s  SHUNTS MV E/A ratio:  0.61         Systemic VTI:  0.26 m                             Systemic Diam: 1.70 cm Kirk Ruths MD Electronically signed by Kirk Ruths MD Signature Date/Time: 09/02/2020/12:26:37 PM    Final    US Abdomen Limited RUQ (LIVER/GB)  Result Date: 09/09/2020 CLINICAL DATA:  Transaminitis EXAM: ULTRASOUND ABDOMEN LIMITED RIGHT UPPER QUADRANT COMPARISON:  None. FINDINGS: Gallbladder: Gallbladder contains some echogenic biliary sludge as well as several slightly more echogenic foci which could reflect some small admixed gallstones (8/33). Borderline gallbladder wall thickening at 4 mm. No pericholecystic fluid or inflammation. Sonographic Percell Miller sign was reportedly negative by sonographer. Common bile duct: Diameter: Poorly visualized. Liver: No focal lesion identified. Within normal limits in parenchymal echogenicity. No intrahepatic biliary ductal dilatation. Challenging visualization of the portal vein. Included portions of the portal vein is patent on color Doppler imaging with normal direction of blood flow towards the liver. Other: COVID positive patient with a technically challenging exam due to a seated positioning and inability to perform decubitus positioning. IMPRESSION: 1. Technically challenging exam due to patient's inability to perform decubitus positioning. 2. Biliary sludge  with possible admixed gallstones and borderline gallbladder wall thickening albeit with a negative sonographic Murphy sign absent pericholecystic fluid. Findings are equivocal for acute cholecystitis though should be considered in the appropriate clinical context. If there is persisting clinical concern, HIDA could be obtained. 3. No intrahepatic biliary ductal dilatation. Challenging visualization of the common bile duct. Electronically Signed   By: Lovena Le M.D.   On: 09/09/2020 21:41

## 2020-09-13 NOTE — Progress Notes (Signed)
Patient received via bed with R.N., N.T. and Resp. Therapy. Alert and orin. R.N. into help with assess. CHG wipes done., Patient on 02 and Monitor. NO complaints voiced. Belongings at bedside and in closet. Pure wick intact.

## 2020-09-13 NOTE — Progress Notes (Signed)
Patient ID: [name] [name], female   DOB: [birth year], 65 y.o.   MRN: [id number] [facility] Progress Note   Assessment/ Plan:   1. Acute kidney Injury on chronic kidney disease stage III T: Status post cadaveric renal transplant in 2016 with baseline creatinine possibly around 1.6-2.0.  Renal function appears stable overnight and she was restarted back on tacrolimus and Myfortic after temporarily holding these medications to help improve efforts at infection control/improvement of respiratory status.  She does not have any acute electrolyte abnormality and appears to be close to euvolemic status. 2.  COVID-19 pneumonia with hypoxic respiratory failure: Ongoing efforts at weaning her oxygen requirements as respiratory status appears to be fluctuating.  She is now out of isolation for COVID-19. 3.  Anemia: Secondary to anemia of chronic kidney disease and likely exacerbated by acute/critical illness with frequent blood draws.  We will continue to follow on ESA and maintain transfusion trigger for hemoglobin <7.0. 4.  Hypertension: Without evidence of volume overload, continue to monitor on current antihypertensive therapy with surveillance of orthostatic dizziness when ambulating. 5.  Hypophosphatemia: She is on a carb modified diet without any phosphorus restriction.  No indication for supplementation at this time.  Subjective:   Reports to be feeling somewhat better following her transfer out of 5 W. last night.  Nuys any chest pain and still having some shortness of breath with activity.   Objective:   BP 131/63 (BP Location: Left Arm)   Pulse (!) 104   Temp 97.9 F (36.6 C) (Oral)   Resp 18   Ht 5\' 2"  (1.575 m)   Wt 85.3 kg   SpO2 94%   BMI 34.40 kg/m   Intake/Output Summary (Last 24 hours) at 09/13/2020 1018 Last data filed at 09/13/2020 6599 Gross per 24 hour  Intake 120 ml  Output 0 ml  Net 120 ml   Weight change: -1.2 kg  Physical Exam: Gen: Appears  somewhat uncomfortable resting in bed, on oxygen via nasal cannula CVS: Pulse regular tachycardia, S1 and S2 with ejection systolic murmur Resp: Fine rales over both bases, no distinct rhonchi/wheeze Abd: Soft, obese, nontender Ext: Trace ankle edema  Imaging: DG Chest Port 1 View  Result Date: 09/12/2020 CLINICAL DATA:  Shortness of breath.  Cough and COVID EXAM: PORTABLE CHEST 1 VIEW COMPARISON:  09/10/2020 FINDINGS: Low volume chest with extensive airspace disease. No visible effusion or air leak. Chronic cardiomegaly. Right subclavian stenting. IMPRESSION: Unchanged low volume chest with multifocal pneumonia. Electronically Signed   By: Monte Fantasia M.D.   On: 09/12/2020 08:46    Labs: BMET Recent Labs  Lab 09/07/20 0315 09/08/20 0307 09/09/20 0136 09/10/20 0318 09/11/20 0155 09/12/20 0113 09/13/20 0140  NA 140 135 139 138 137 138 136  K 4.7 5.3* 4.0 4.3 4.6 4.1 3.8  CL 103 102 103 105 106 103 102  CO2 24 23 24 23 24 25 24   GLUCOSE 182* 250* 111* 81 188* 123* 178*  BUN 49* 55* 59* 50* 47* 38* 37*  CREATININE 2.48* 2.40* 2.46* 2.15* 2.02* 1.93* 1.95*  CALCIUM 10.0 9.9 9.8 10.0 9.7 9.8 9.4  PHOS  --   --   --   --   --   --  2.2*   CBC Recent Labs  Lab 09/09/20 0136 09/10/20 0318 09/11/20 0155 09/12/20 0113 09/13/20 0140  WBC 15.4* 10.0 8.2 8.3 7.1  NEUTROABS 14.5* 9.4* 7.7 7.7  --   HGB 7.1* 7.2* 7.2* 8.6* 8.8*  HCT  21.1* 20.9* 21.0* 26.0* 26.4*  MCV 79.0* 80.1 80.5 81.8 82.0  PLT 99* 92* 88* 91* 92*   Medications:    . amLODipine  10 mg Oral Daily  . vitamin C  500 mg Oral Daily  . aspirin EC  81 mg Oral Daily  . darbepoetin (ARANESP) injection - NON-DIALYSIS  150 mcg Subcutaneous Q Tue-1800  . enoxaparin (LOVENOX) injection  30 mg Subcutaneous Q24H  . feeding supplement (NEPRO CARB STEADY)  237 mL Oral BID BM  . fluticasone  1 spray Each Nare Daily  . folic acid  1 mg Oral Daily  . hydrALAZINE  25 mg Oral Q8H  . insulin aspart  0-15 Units Subcutaneous  TID WC  . insulin detemir  8 Units Subcutaneous BID  . isosorbide mononitrate  60 mg Oral Daily  . levothyroxine  50 mcg Oral Q0600  . LORazepam  1 mg Oral BID  . mouth rinse  15 mL Mouth Rinse BID  . methylPREDNISolone (SOLU-MEDROL) injection  20 mg Intravenous Daily  . mycophenolate  360 mg Oral BID  . pantoprazole  40 mg Oral BID  . tacrolimus  3 mg Oral QPM  . tacrolimus  4 mg Oral Daily  . zinc sulfate  220 mg Oral Daily   Elmarie Shiley, MD 09/13/2020, 10:18 AM

## 2020-09-13 NOTE — Progress Notes (Signed)
Transported patient to 4E  on a NRB. Pt placed back on HFNC once in the room. Pt stable. RT will cont. To monitor

## 2020-09-14 DIAGNOSIS — N179 Acute kidney failure, unspecified: Secondary | ICD-10-CM | POA: Diagnosis not present

## 2020-09-14 DIAGNOSIS — E119 Type 2 diabetes mellitus without complications: Secondary | ICD-10-CM

## 2020-09-14 DIAGNOSIS — D849 Immunodeficiency, unspecified: Secondary | ICD-10-CM | POA: Diagnosis not present

## 2020-09-14 DIAGNOSIS — U071 COVID-19: Secondary | ICD-10-CM | POA: Diagnosis not present

## 2020-09-14 DIAGNOSIS — Z940 Kidney transplant status: Secondary | ICD-10-CM | POA: Diagnosis not present

## 2020-09-14 LAB — RENAL FUNCTION PANEL
Albumin: 3.2 g/dL — ABNORMAL LOW (ref 3.5–5.0)
Anion gap: 9 (ref 5–15)
BUN: 29 mg/dL — ABNORMAL HIGH (ref 8–23)
CO2: 23 mmol/L (ref 22–32)
Calcium: 9.5 mg/dL (ref 8.9–10.3)
Chloride: 106 mmol/L (ref 98–111)
Creatinine, Ser: 1.74 mg/dL — ABNORMAL HIGH (ref 0.44–1.00)
GFR, Estimated: 32 mL/min — ABNORMAL LOW (ref >60)
Glucose, Bld: 142 mg/dL — ABNORMAL HIGH (ref 70–99)
Phosphorus: 1.9 mg/dL — ABNORMAL LOW (ref 2.5–4.6)
Potassium: 3.7 mmol/L (ref 3.5–5.1)
Sodium: 138 mmol/L (ref 135–145)

## 2020-09-14 LAB — GLUCOSE, CAPILLARY
Glucose-Capillary: 132 mg/dL — ABNORMAL HIGH (ref 70–99)
Glucose-Capillary: 142 mg/dL — ABNORMAL HIGH (ref 70–99)
Glucose-Capillary: 163 mg/dL — ABNORMAL HIGH (ref 70–99)
Glucose-Capillary: 173 mg/dL — ABNORMAL HIGH (ref 70–99)
Glucose-Capillary: 186 mg/dL — ABNORMAL HIGH (ref 70–99)

## 2020-09-14 MED ORDER — PREDNISONE 5 MG PO TABS
5.0000 mg | ORAL_TABLET | Freq: Every day | ORAL | Status: DC
  Administered 2020-09-15 – 2020-09-22 (×8): 5 mg via ORAL
  Filled 2020-09-14 (×9): qty 1

## 2020-09-14 NOTE — Progress Notes (Signed)
PROGRESS NOTE                                                                                                                                                                                                             Patient Demographics:    [name] [name], is a 65 y.o. female, DOB - 1956-05-27, CBS:[id number]  Outpatient Primary MD for the patient is Glendale Chard, MD   Admit date - 08/18/2020   LOS - 22  Chief Complaint  Patient presents with  . Covid Positive  . Chest Pain  . Weakness       Brief Narrative: Patient is a 65 y.o. female with PMHx of renal transplant x2 (latest in 2016) on chronic immunosuppressive agents, DM-2, hypothyroidism-who presented with shortness of breath-found to have acute hypoxic respiratory failure due to COVID-19 pneumonia.  Hospital course complicated by slow improvement from severe hypoxemia, AKI and non-STEMI.  COVID-19 vaccinated status: Vaccinated including booster  Significant Events: 1/10>> Admit to [facility] for acute hypoxic respiratory failure due to COVID-19 pneumonia 1/11>> transfer to ICU due to worsening hypoxemia, AKI/metabolic acidosis/hyperkalemia. 1/14>> transferred to [facility] 1/23>> chest pain-non-STEMI-cardiology consult 1/25>> chest pain-resolved by SL nitroglycerin x1 1/28>> tachypneic- worsening hypoxemia-now on 15 L via NRB and 15 L via HFNC  Significant studies: 1/10>> chest x-ray: Severe multilobar bilateral pneumonia. 1/11>> ultrasound renal transplant: No hydronephrosis 1/16>> CT chest: Multifocal viral pneumonia 1/26>>Echo: EF> 70%, LVH, grade 1 diastolic dysfunction 8/46>> chest x-ray: Progression of diffuse bilateral pulmonary opacitie   COVID-19 medications: Steroids: 1/10>> Remdesivir: 1/10>>1/15 Baricitinib: 1/16>>1/29  Antibiotics: Rocephin: 1/12>> 1/18 Cefepime: 1/28, 1/29 Vancomycin: 1/28x1  Microbiology data: 1/10 >>blood culture: No  growth 1/11>> urine culture: Klebsiella pneumoniae  Procedures: None  Consults: PCCM, nephrology, cardiology  DVT prophylaxis: enoxaparin (LOVENOX) injection 30 mg Start: 09/03/20 1400 SCDs Start: 08/19/20 0933    Subjective:   Patient in bed, does not appear in distress , she is on heated high flow nasal cannula , no cough , denies pain, no fever She appear euvolemic     Assessment  & Plan :   Acute Hypoxic Resp Failure due to Covid 19 Viral pneumonia: she has been fully vaccinated however she was immunocompromised due to her underlying history of renal transplant and being on immunosuppressive medications.   She developed severe parenchymal lung injury due to COVID -19  pneumonia/ARDS, has been treated with IV steroids(from 1/11 to 2/5), Baricitinib( from 1/16 to 1/29) and Remdesivir combination along with as needed Lasix.  She has also been given a trial of IV antibiotics for any superimposed bacterial component to her pneumonia, procalcitonin has been undetectable.  Despite aggressive measures she remains severely hypoxic, detailed discussion between treatment team and family has been done by previous MD  On several occasions.   Goal of care is gentle medical treatment to continue, if significant decline or worsening focus on comfort measures.    Continue heated high flow with supplemental nonrebreather mask upon increase activity and oxygen demand, will try and keep her on 1 source if possible for majority of the time to know exactly what her oxygen requirements are.  If any clinical worsening or discomfort full comfort care will be initiated, Dr Candiss Norse have discussed this with daughter on 09/06/2020 & 09/12/20.  Prone/Incentive Spirometry: encouraged  incentive spirometry use 3-4/hour.  O2 requirements:   SpO2: 98 % O2 Flow Rate (L/min): 30 L/min FiO2 (%): 100 %     Sinus pause: Occurred on 1/20-approximately 4.25 seconds-this occurred while she was sleeping-after discussion  with cardiology-beta-blocker discontinued.  Needs outpatient polysomnography.  Non-STEMI: Developed severe chest pain on 1/23 with EKG changes-relieved with nitroglycerin and morphine infusion-chest pain reoccurred on 1/25.  Echo is reassuring with preserved EF.  On aspirin/statin-has completed the 48 hours of IV heparin.  Cardiology has signed off.  AKI on CKD stage IV-transplanted kidney (second renal transplant-01/2015): AKI hemodynamically mediated-creatinine has overall improved  Nephrology reconsulted-immunosuppression held during active Covid , now back on Myfortic Prograf  She was on solumedrol for covid treatment, now she finished solumedrol , will resume home meds prednisone 5mg  daily   Hyperkalemia/anion gap metabolic acidosis: Secondary to AKI-has resolved.  Transaminitis: LFTs have spiked a bit on 09/09/2020, hold statin, right upper quadrant ultrasound is nonacute with some sludge and gallstones but no signs of Murphy sign or BD obstruction, negative acute hepatitis panel.  Patient is asymptomatic, LFT trend is now coming down , monitor.    Anemia: Combination of anemia of chronic disease and critical illness, no signs of active bleeding, unremarkable anemia panel.  S/p 1 unit of packed RBC transfusion on 09/11/2020 with stable posttransfusion H&H.  Thrombocytopenia: Mild-stable-gradually improving-watch closely.   Complicated UTI: Completed a course of Rocephin.  Afebrile and nontoxic-appearing.  Hypothyroidism: -given suppressed TSH- Levothyroxine dose decreased  to 50 mcg-repeat TSH in 3 months.  HTN: BP stable-continue manidipine and hydralazine.   HLD:  Statin held due to elevated LFT  Obesity: BMI of 34 follow with PCP post discharge for weight loss.   DKA: Resolved-no longer on insulin drip.  DM-2 (A1c 8.2 on 1/11) with uncontrolled hyperglycemia due to steroids: Levemir and sliding scale dose adjusted on 09/10/2020 with rapidly decreasing steroids.   Lab Results   Component Value Date   HGBA1C 8.2 (H) 08/19/2020   CBG (last 3)  Recent Labs    09/14/20 0623 09/14/20 1106 09/14/20 1603  GLUCAP 132* 163* 173*    RN pressure injury documentation: Pressure Injury 08/20/20 Coccyx Mid Stage 1 -  Intact skin with non-blanchable redness of a localized area usually over a bony prominence. (Active)  08/20/20 0800  Location: Coccyx  Location Orientation: Mid  Staging: Stage 1 -  Intact skin with non-blanchable redness of a localized area usually over a bony prominence.  Wound Description (Comments):   Present on Admission: Yes    GI prophylaxis :  PPI  Condition - Extremely Guarded  Family Communication :  Daughter Mendel Ryder [phone] updated on 09/04/20, 09/06/20, 09/09/2020, 09/12/20   Code Status :  DNR  Diet :  Diet Order            Diet Carb Modified Fluid consistency: Thin; Room service appropriate? Yes  Diet effective now                  Disposition Plan  :  Status is: Inpatient  Remains inpatient appropriate because:Inpatient level of care appropriate due to severity of illness  Dispo: The patient is from: Home              Anticipated d/c is to: SNF              Anticipated d/c date is: > 3 days              Patient currently is not medically stable to d/c.   Barriers to discharge: Worsening hypoxia requiring O2 supplementation  Antimicorbials  :    Anti-infectives (From admission, onward)   Start     Dose/Rate Route Frequency Ordered Stop   09/07/20 1030  vancomycin (VANCOCIN) IVPB 1000 mg/200 mL premix  Status:  Discontinued        1,000 mg 200 mL/hr over 60 Minutes Intravenous Every 48 hours 09/05/20 0933 09/07/20 0951   09/05/20 1030  vancomycin (VANCOREADY) IVPB 2000 mg/400 mL        2,000 mg 200 mL/hr over 120 Minutes Intravenous  Once 09/05/20 0933 09/05/20 2018   09/05/20 1030  ceFEPIme (MAXIPIME) 2 g in sodium chloride 0.9 % 100 mL IVPB  Status:  Discontinued        2 g 200 mL/hr over 30 Minutes Intravenous  Every 24 hours 09/05/20 0933 09/07/20 0951   08/20/20 1445  cefTRIAXone (ROCEPHIN) 1 g in sodium chloride 0.9 % 100 mL IVPB        1 g 200 mL/hr over 30 Minutes Intravenous Every 24 hours 08/20/20 1348 08/26/20 2147   08/20/20 1000  remdesivir 100 mg in sodium chloride 0.9 % 100 mL IVPB       "Followed by" Linked Group Details   100 mg 200 mL/hr over 30 Minutes Intravenous Daily 08/19/20 0021 08/23/20 1113   08/19/20 0130  remdesivir 200 mg in sodium chloride 0.9% 250 mL IVPB       "Followed by" Linked Group Details   200 mg 580 mL/hr over 30 Minutes Intravenous Once 08/19/20 0021 08/19/20 0314      Inpatient Medications   Scheduled Meds: . amLODipine  10 mg Oral Daily  . vitamin C  500 mg Oral Daily  . aspirin EC  81 mg Oral Daily  . darbepoetin (ARANESP) injection - NON-DIALYSIS  150 mcg Subcutaneous Q Tue-1800  . enoxaparin (LOVENOX) injection  30 mg Subcutaneous Q24H  . feeding supplement (NEPRO CARB STEADY)  237 mL Oral BID BM  . fluticasone  1 spray Each Nare Daily  . folic acid  1 mg Oral Daily  . hydrALAZINE  25 mg Oral Q8H  . insulin aspart  0-15 Units Subcutaneous TID WC  . insulin detemir  8 Units Subcutaneous BID  . isosorbide mononitrate  60 mg Oral Daily  . levothyroxine  50 mcg Oral Q0600  . LORazepam  1 mg Oral BID  . mouth rinse  15 mL Mouth Rinse BID  . mycophenolate  360 mg Oral BID  . pantoprazole  40 mg Oral BID  .  tacrolimus  3 mg Oral QPM  . tacrolimus  4 mg Oral Daily  . zinc sulfate  220 mg Oral Daily   Continuous Infusions: . sodium chloride    . sodium chloride Stopped (08/31/20 1545)   PRN Meds:.sodium chloride, acetaminophen **OR** acetaminophen, albuterol, guaiFENesin, hydrALAZINE, loperamide, ondansetron (ZOFRAN) IV, polyethylene glycol, sodium chloride   Time Spent in minutes  25   See all Orders from today for further details   Florencia Reasons M.D on 09/14/2020 at 4:59 PM  To page go to [email or web address] - use universal password  [facility] -  Office  [phone]    Objective:   Vitals:   09/14/20 0934 09/14/20 1108 09/14/20 1300 09/14/20 1603  BP: 136/71 130/73  120/62  Pulse: 100 100  100  Resp: 20 20  16   Temp: 97.9 F (36.6 C) 98.6 F (37 C)  99 F (37.2 C)  TempSrc: Oral Oral  Oral  SpO2: 94% 90% 92% 98%  Weight:      Height:        Wt Readings from Last 3 Encounters:  09/14/20 85.3 kg  05/14/20 92.5 kg  05/14/20 92.9 kg     Intake/Output Summary (Last 24 hours) at 09/14/2020 1659 Last data filed at 09/14/2020 1300 Gross per 24 hour  Intake 640 ml  Output 550 ml  Net 90 ml     Physical Exam  Awake Alert, No new F.N deficits, Normal affect appear euvomic, NAD [place].AT,PERRAL Supple Neck,No JVD,  Symmetrical Chest wall movement, Good air movement bilaterally, CTAB RRR,No Gallops, Rubs or new Murmurs, No Parasternal Heave +ve B.Sounds, Abd Soft, No tenderness, No organomegaly appriciated, No rebound - guarding or rigidity. No Cyanosis, Clubbing or edema, No new Rash or bruise    Data Review:    CBC Recent Labs  Lab 09/08/20 0307 09/09/20 0136 09/10/20 0318 09/11/20 0155 09/12/20 0113 09/13/20 0140  WBC 14.1* 15.4* 10.0 8.2 8.3 7.1  HGB 7.4* 7.1* 7.2* 7.2* 8.6* 8.8*  HCT 21.8* 21.1* 20.9* 21.0* 26.0* 26.4*  PLT 101* 99* 92* 88* 91* 92*  MCV 78.1* 79.0* 80.1 80.5 81.8 82.0  MCH 26.5 26.6 27.6 27.6 27.0 27.3  MCHC 33.9 33.6 34.4 34.3 33.1 33.3  RDW 19.0* 19.4* 20.5* 21.1* 19.9* 20.5*  LYMPHSABS 0.2* 0.3* 0.2* 0.2* 0.3*  --   MONOABS 0.2 0.4 0.3 0.2 0.3  --   EOSABS 0.0 0.0 0.0 0.0 0.0  --   BASOSABS 0.0 0.0 0.0 0.0 0.0  --     Chemistries  Recent Labs  Lab 09/08/20 0307 09/09/20 0136 09/10/20 0318 09/11/20 0155 09/12/20 0113 09/13/20 0140 09/14/20 1009  NA 135 139 138 137 138 136 138  K 5.3* 4.0 4.3 4.6 4.1 3.8 3.7  CL 102 103 105 106 103 102 106  CO2 23 24 23 24 25 24 23   GLUCOSE 250* 111* 81 188* 123* 178* 142*  BUN 55* 59* 50* 47* 38* 37* 29*  CREATININE  2.40* 2.46* 2.15* 2.02* 1.93* 1.95* 1.74*  CALCIUM 9.9 9.8 10.0 9.7 9.8 9.4 9.5  MG 2.3  --   --   --   --   --   --   AST 65* 110* 55* 38 27  --   --   ALT 151* 245* 181* 149* 116*  --   --   ALKPHOS 185* 233* 215* 208* 203*  --   --   BILITOT 1.1 1.3* 1.1 0.8 1.4*  --   --    ------------------------------------------------------------------------------------------------------------------  No results for input(s): CHOL, HDL, LDLCALC, TRIG, CHOLHDL, LDLDIRECT in the last 72 hours.  Lab Results  Component Value Date   HGBA1C 8.2 (H) 08/19/2020   ------------------------------------------------------------------------------------------------------------------ No results for input(s): TSH, T4TOTAL, T3FREE, THYROIDAB in the last 72 hours.  Invalid input(s): FREET3 ------------------------------------------------------------------------------------------------------------------ No results for input(s): VITAMINB12, FOLATE, FERRITIN, TIBC, IRON, RETICCTPCT in the last 72 hours.  Coagulation profile Recent Labs  Lab 09/10/20 0318 09/11/20 0155 09/12/20 0113 09/13/20 0140  INR 1.2 1.2 1.1 1.1    Recent Labs    09/12/20 0113  DDIMER 0.41    Cardiac Enzymes No results for input(s): CKMB, TROPONINI, MYOGLOBIN in the last 168 hours.  Invalid input(s): CK ------------------------------------------------------------------------------------------------------------------    Component Value Date/Time   BNP 80.1 09/12/2020 0113    Micro Results Recent Results (from the past 240 hour(s))  MRSA PCR Screening     Status: None   Collection Time: 09/07/20  9:37 AM   Specimen: Nasopharyngeal  Result Value Ref Range Status   MRSA by PCR NEGATIVE NEGATIVE Final    Comment:        The GeneXpert MRSA Assay (FDA approved for NASAL specimens only), is one component of a comprehensive MRSA colonization surveillance program. It is not intended to diagnose MRSA infection nor to guide  or monitor treatment for MRSA infections. Performed at [facility], [place] [address] [place], [place] [address]     Radiology Reports CT CHEST WO CONTRAST  Result Date: 08/24/2020 CLINICAL DATA:  Severe hypoxia and respiratory failure with COVID-19 infection. EXAM: CT CHEST WITHOUT CONTRAST TECHNIQUE: Multidetector CT imaging of the chest was performed following the standard protocol without IV contrast. COMPARISON:  Chest x-ray 08/18/2020 FINDINGS: Cardiovascular: Mild cardiomegaly. Mild calcification over the mitral valve annulus. Thoracic aorta is normal caliber. Stent over the right subclavian/axillary region. Remaining vascular structures are unremarkable. Mediastinum/Nodes: No evidence of mediastinal or hilar adenopathy. Remaining mediastinal structures are unremarkable. Lungs/Pleura: Lungs are adequately inflated demonstrate moderate bilateral hazy airspace process likely multifocal viral pneumonia in this COVID-19 positive patient. No evidence of effusion. Airways are otherwise unremarkable. Upper Abdomen: No acute findings. Musculoskeletal: Degenerative change of the spine. Mild anterior wedging of a midthoracic vertebral body likely chronic. IMPRESSION: 1. Moderate bilateral hazy airspace process likely multifocal viral pneumonia in this COVID-19 positive patient. 2. Mild cardiomegaly. 3. Mild anterior wedging of a midthoracic vertebral body likely chronic. Electronically Signed   By: Marin Olp M.D.   On: 08/24/2020 12:37   US Renal Transplant w/Doppler  Result Date: 08/19/2020 CLINICAL DATA:  Acute kidney injury EXAM: ULTRASOUND OF RENAL TRANSPLANT WITH RENAL DOPPLER ULTRASOUND TECHNIQUE: Ultrasound examination of the renal transplant was performed with gray-scale, color and duplex doppler evaluation. COMPARISON:  None. FINDINGS: Transplant kidney location: Left lower quadrant Transplant Kidney: Renal measurements: 10.8 x 6.2 x 6.5 cm = volume: 223mL. Normal in size and parenchymal  echogenicity. No evidence of mass or hydronephrosis. No peri-transplant fluid collection seen. Color flow in the main renal artery:  Yes Color flow in the main renal vein:  Yes Duplex Doppler Evaluation: Main Renal Artery Velocity:  cm/sec Main Renal Artery Resistive Index: 0.99 Venous waveform in main renal vein:  Present Intrarenal resistive index in upper pole:  0.98 (normal 0.6-0.8; equivocal 0.8-0.9; abnormal >= 0.9) Intrarenal resistive index in lower pole: 0.98 (normal 0.6-0.8; equivocal 0.8-0.9; abnormal >= 0.9) Bladder: Normal for degree of bladder distention. Other findings:  None. IMPRESSION: Elevated resistive indices within the left lower quadrant transplant kidney.  No hydronephrosis. Electronically Signed   By: Rolm Baptise M.D.   On: 08/19/2020 19:15   DG Chest Port 1 View  Result Date: 09/12/2020 CLINICAL DATA:  Shortness of breath.  Cough and COVID EXAM: PORTABLE CHEST 1 VIEW COMPARISON:  09/10/2020 FINDINGS: Low volume chest with extensive airspace disease. No visible effusion or air leak. Chronic cardiomegaly. Right subclavian stenting. IMPRESSION: Unchanged low volume chest with multifocal pneumonia. Electronically Signed   By: Monte Fantasia M.D.   On: 09/12/2020 08:46   DG Chest Port 1 View  Result Date: 09/10/2020 CLINICAL DATA:  COVID positive EXAM: PORTABLE CHEST 1 VIEW COMPARISON:  09/07/2020 FINDINGS: Diffuse bilateral airspace disease unchanged. No significant pleural effusion. No pneumothorax. Cardiac enlargement.  Right axillary stent. IMPRESSION: Diffuse bilateral airspace disease unchanged from the prior study. Electronically Signed   By: Franchot Gallo M.D.   On: 09/10/2020 09:10   DG Chest Port 1 View  Result Date: 09/07/2020 CLINICAL DATA:  Post renal transplant x 2 most recently 2016, chronic immunosuppressive therapy, type II diabetes mellitus, presents with shortness of breath, acute hypoxic respiratory failure due to COVID-19 pneumonia EXAM: PORTABLE CHEST 1 VIEW  COMPARISON:  Portable exam 1005 hours compared to 09/05/2020 FINDINGS: Enlargement of cardiac silhouette. Mediastinal contours and pulmonary vascularity normal. Atherosclerotic calcification aorta. BILATERAL scattered mixed airspace interstitial infiltrates consistent with multifocal pneumonia and COVID-19. No pleural effusion or pneumothorax. Bones demineralized with stent identified at the RIGHT axillary vessels. IMPRESSION: Persistent diffuse BILATERAL pulmonary infiltrates consistent with multifocal pneumonia and COVID-19. Electronically Signed   By: Lavonia Dana M.D.   On: 09/07/2020 10:28   DG CHEST PORT 1 VIEW  Result Date: 08/31/2020 CLINICAL DATA:  65 year old female with shortness of breath. EXAM: PORTABLE CHEST 1 VIEW COMPARISON:  Chest radiograph dated 08/18/2020. FINDINGS: Bilateral confluent and reticular densities, progressed since the prior radiograph and most consistent with multifocal pneumonia. No pleural effusion pneumothorax. Borderline cardiomegaly. No acute osseous pathology. IMPRESSION: Interval progression of bilateral pulmonary opacities compared to the radiograph of 08/18/2020. Continued follow-up recommended. Electronically Signed   By: Anner Crete M.D.   On: 08/31/2020 21:17   DG Chest Portable 1 View  Result Date: 08/18/2020 CLINICAL DATA:  65 year old female with history of COVID infection. Chest pain. Shortness of breath. EXAM: PORTABLE CHEST 1 VIEW COMPARISON:  Chest x-ray 10/20/2014. FINDINGS: Patchy multifocal airspace consolidation and areas of interstitial prominence are noted throughout the lungs bilaterally, most severe throughout the periphery of the mid to lower left lung, indicative of multilobar bilateral pneumonia. No definite pleural effusions. No evidence of pulmonary edema. No pneumothorax. Heart size is borderline enlarged. Upper mediastinal contours are within normal limits. Atherosclerotic calcifications in the thoracic aorta. Vascular stent projecting  over the right subclavian region. IMPRESSION: 1. Severe multilobar bilateral (left greater than right) pneumonia compatible with reported COVID infection. 2. Aortic atherosclerosis. Electronically Signed   By: Vinnie Langton M.D.   On: 08/18/2020 18:02   DG Chest Port 1V same Day  Result Date: 09/05/2020 CLINICAL DATA:  Shortness of breath, COVID positive EXAM: PORTABLE CHEST 1 VIEW COMPARISON:  08/31/2020 FINDINGS: Persistent diffuse bilateral pulmonary opacities. Aeration has decreased since the prior study. No significant pleural effusion. No pneumothorax. Stable cardiomediastinal contours. IMPRESSION: Progression of diffuse bilateral pulmonary opacities. Electronically Signed   By: Macy Mis M.D.   On: 09/05/2020 08:05   ECHOCARDIOGRAM LIMITED  Result Date: 09/02/2020    ECHOCARDIOGRAM LIMITED REPORT   Patient Name:   [facility] Jimmye Norman Date of Exam:  09/02/2020 Medical Rec #:  [id number]               Height:       62.0 in Accession #:    [id number]              Weight:       194.4 lb Date of Birth:  1956/03/28              BSA:          1.889 m Patient Age:    23 years                BP:           135/66 mmHg Patient Gender: F                       HR:           100 bpm. Exam Location:  Inpatient Procedure: Limited Echo, Limited Color Doppler and Cardiac Doppler Indications:    chest pain  History:        Patient has prior history of Echocardiogram examinations, most                 recent 09/30/2006. Covid; Risk Factors:Diabetes and Hypertension.  Sonographer:    Johny Chess Referring Phys: [place]  1. Left ventricular ejection fraction, by estimation, is >75%. The left ventricle has hyperdynamic function. The left ventricle has no regional wall motion abnormalities. There is moderate left ventricular hypertrophy. Left ventricular diastolic parameters are consistent with Grade I diastolic dysfunction (impaired relaxation).  2. Right ventricular systolic  function is normal. The right ventricular size is normal.  3. The mitral valve is normal in structure. No evidence of mitral valve regurgitation. No evidence of mitral stenosis. Moderate mitral annular calcification.  4. The aortic valve is tricuspid. Aortic valve regurgitation is not visualized. Mild aortic valve sclerosis is present, with no evidence of aortic valve stenosis.  5. The inferior vena cava is normal in size with greater than 50% respiratory variability, suggesting right atrial pressure of 3 mmHg. FINDINGS  Left Ventricle: Left ventricular ejection fraction, by estimation, is >75%. The left ventricle has hyperdynamic function. The left ventricle has no regional wall motion abnormalities. The left ventricular internal cavity size was normal in size. There is moderate left ventricular hypertrophy. Left ventricular diastolic parameters are consistent with Grade I diastolic dysfunction (impaired relaxation). Right Ventricle: The right ventricular size is normal. Right ventricular systolic function is normal. Left Atrium: Left atrial size was normal in size. Right Atrium: Right atrial size was normal in size. Pericardium: There is no evidence of pericardial effusion. Mitral Valve: The mitral valve is normal in structure. Moderate mitral annular calcification. No evidence of mitral valve stenosis. Tricuspid Valve: The tricuspid valve is normal in structure. Tricuspid valve regurgitation is trivial. No evidence of tricuspid stenosis. Aortic Valve: The aortic valve is tricuspid. Aortic valve regurgitation is not visualized. Mild aortic valve sclerosis is present, with no evidence of aortic valve stenosis. Pulmonic Valve: The pulmonic valve was normal in structure. Pulmonic valve regurgitation is not visualized. No evidence of pulmonic stenosis. Aorta: The aortic root is normal in size and structure. Venous: The inferior vena cava is normal in size with greater than 50% respiratory variability, suggesting right  atrial pressure of 3 mmHg. IAS/Shunts: No atrial level shunt detected by color flow Doppler. LEFT VENTRICLE PLAX 2D LVIDd:  3.70 cm  Diastology LVIDs:         2.50 cm  LV e' medial:    5.33 cm/s LV PW:         1.00 cm  LV E/e' medial:  13.8 LV IVS:        0.90 cm  LV e' lateral:   5.66 cm/s LVOT diam:     1.70 cm  LV E/e' lateral: 13.0 LV SV:         59 LV SV Index:   31 LVOT Area:     2.27 cm  IVC IVC diam: 1.30 cm LEFT ATRIUM         Index LA diam:    3.90 cm 2.06 cm/m  AORTIC VALVE LVOT Vmax:   170.00 cm/s LVOT Vmean:  114.000 cm/s LVOT VTI:    0.258 m  AORTA Ao Asc diam: 2.60 cm MV E velocity: 73.50 cm/s MV A velocity: 121.00 cm/s  SHUNTS MV E/A ratio:  0.61         Systemic VTI:  0.26 m                             Systemic Diam: 1.70 cm Kirk Ruths MD Electronically signed by Kirk Ruths MD Signature Date/Time: 09/02/2020/12:26:37 PM    Final    US Abdomen Limited RUQ (LIVER/GB)  Result Date: 09/09/2020 CLINICAL DATA:  Transaminitis EXAM: ULTRASOUND ABDOMEN LIMITED RIGHT UPPER QUADRANT COMPARISON:  None. FINDINGS: Gallbladder: Gallbladder contains some echogenic biliary sludge as well as several slightly more echogenic foci which could reflect some small admixed gallstones (8/33). Borderline gallbladder wall thickening at 4 mm. No pericholecystic fluid or inflammation. Sonographic Percell Miller sign was reportedly negative by sonographer. Common bile duct: Diameter: Poorly visualized. Liver: No focal lesion identified. Within normal limits in parenchymal echogenicity. No intrahepatic biliary ductal dilatation. Challenging visualization of the portal vein. Included portions of the portal vein is patent on color Doppler imaging with normal direction of blood flow towards the liver. Other: COVID positive patient with a technically challenging exam due to a seated positioning and inability to perform decubitus positioning. IMPRESSION: 1. Technically challenging exam due to patient's inability to perform  decubitus positioning. 2. Biliary sludge with possible admixed gallstones and borderline gallbladder wall thickening albeit with a negative sonographic Murphy sign absent pericholecystic fluid. Findings are equivocal for acute cholecystitis though should be considered in the appropriate clinical context. If there is persisting clinical concern, HIDA could be obtained. 3. No intrahepatic biliary ductal dilatation. Challenging visualization of the common bile duct. Electronically Signed   By: Lovena Le M.D.   On: 09/09/2020 21:41

## 2020-09-14 NOTE — Progress Notes (Signed)
Patient ID: [name] [name], female   DOB: 1955-11-03, 65 y.o.   MRN: [id number] [facility] Progress Note   Assessment/ Plan:   1. Acute kidney Injury on chronic kidney disease stage III T: Status post cadaveric renal transplant in 2016 with baseline creatinine possibly around 1.6-2.0.  Now back on tacrolimus and Myfortic after temporarily holding these medications to help improve efforts at infection control/improvement of respiratory status.  She does not have any labs from this morning and I have ordered these and will follow up on results as indicated.  She appears euvolemic on physical exam. 2.  COVID-19 pneumonia with hypoxic respiratory failure: She remains hypoxemic with variable oxygen demands-currently on NRB.  She is now out of isolation for COVID-19. 3.  Anemia: Secondary to anemia of chronic kidney disease and likely exacerbated by acute/critical illness with frequent blood draws.  We will continue to follow on ESA and maintain transfusion trigger for hemoglobin <7.0. 4.  Hypertension: Without evidence of volume overload, continue to monitor on current antihypertensive therapy with surveillance of orthostatic dizziness when ambulating. 5.  Hypophosphatemia: She is on a carb modified diet without any phosphorus restriction-await labs from this morning.  No indication for supplementation at this time.  Subjective:   Reports to had a comfortable night however, short of breath this morning.   Objective:   BP 136/71 (BP Location: Left Arm)   Pulse 100   Temp 97.9 F (36.6 C) (Oral)   Resp 20   Ht 5\' 2"  (1.575 m)   Wt 85.3 kg   SpO2 94%   BMI 34.40 kg/m   Intake/Output Summary (Last 24 hours) at 09/14/2020 0945 Last data filed at 09/14/2020 0410 Gross per 24 hour  Intake 350 ml  Output 650 ml  Net -300 ml   Weight change: 0 kg  Physical Exam: Gen: Appears somewhat uncomfortable resting in bed, on oxygen via NRB mask CVS: Pulse regular tachycardia, S1 and  S2 with ejection systolic murmur Resp: Fine rales over both bases, no distinct rhonchi/wheeze Abd: Soft, obese, nontender Ext: No palpable ankle edema  Imaging: No results found.  Labs: BMET Recent Labs  Lab 09/08/20 0307 09/09/20 0136 09/10/20 0318 09/11/20 0155 09/12/20 0113 09/13/20 0140  NA 135 139 138 137 138 136  K 5.3* 4.0 4.3 4.6 4.1 3.8  CL 102 103 105 106 103 102  CO2 23 24 23 24 25 24   GLUCOSE 250* 111* 81 188* 123* 178*  BUN 55* 59* 50* 47* 38* 37*  CREATININE 2.40* 2.46* 2.15* 2.02* 1.93* 1.95*  CALCIUM 9.9 9.8 10.0 9.7 9.8 9.4  PHOS  --   --   --   --   --  2.2*   CBC Recent Labs  Lab 09/09/20 0136 09/10/20 0318 09/11/20 0155 09/12/20 0113 09/13/20 0140  WBC 15.4* 10.0 8.2 8.3 7.1  NEUTROABS 14.5* 9.4* 7.7 7.7  --   HGB 7.1* 7.2* 7.2* 8.6* 8.8*  HCT 21.1* 20.9* 21.0* 26.0* 26.4*  MCV 79.0* 80.1 80.5 81.8 82.0  PLT 99* 92* 88* 91* 92*   Medications:    . amLODipine  10 mg Oral Daily  . vitamin C  500 mg Oral Daily  . aspirin EC  81 mg Oral Daily  . darbepoetin (ARANESP) injection - NON-DIALYSIS  150 mcg Subcutaneous Q Tue-1800  . enoxaparin (LOVENOX) injection  30 mg Subcutaneous Q24H  . feeding supplement (NEPRO CARB STEADY)  237 mL Oral BID BM  . fluticasone  1 spray Each  Nare Daily  . folic acid  1 mg Oral Daily  . hydrALAZINE  25 mg Oral Q8H  . insulin aspart  0-15 Units Subcutaneous TID WC  . insulin detemir  8 Units Subcutaneous BID  . isosorbide mononitrate  60 mg Oral Daily  . levothyroxine  50 mcg Oral Q0600  . LORazepam  1 mg Oral BID  . mouth rinse  15 mL Mouth Rinse BID  . mycophenolate  360 mg Oral BID  . pantoprazole  40 mg Oral BID  . tacrolimus  3 mg Oral QPM  . tacrolimus  4 mg Oral Daily  . zinc sulfate  220 mg Oral Daily   Elmarie Shiley, MD 09/14/2020, 9:45 AM

## 2020-09-15 DIAGNOSIS — N179 Acute kidney failure, unspecified: Secondary | ICD-10-CM | POA: Diagnosis not present

## 2020-09-15 LAB — GLUCOSE, CAPILLARY
Glucose-Capillary: 118 mg/dL — ABNORMAL HIGH (ref 70–99)
Glucose-Capillary: 205 mg/dL — ABNORMAL HIGH (ref 70–99)
Glucose-Capillary: 236 mg/dL — ABNORMAL HIGH (ref 70–99)
Glucose-Capillary: 181 mg/dL — ABNORMAL HIGH (ref 70–99)

## 2020-09-15 LAB — CBC
HCT: 28.2 % — ABNORMAL LOW (ref 36.0–46.0)
Hemoglobin: 9.1 g/dL — ABNORMAL LOW (ref 12.0–15.0)
MCH: 27.1 pg (ref 26.0–34.0)
MCHC: 32.3 g/dL (ref 30.0–36.0)
MCV: 83.9 fL (ref 80.0–100.0)
Platelets: 93 10*3/uL — ABNORMAL LOW (ref 150–400)
RBC: 3.36 MIL/uL — ABNORMAL LOW (ref 3.87–5.11)
RDW: 21.3 % — ABNORMAL HIGH (ref 11.5–15.5)
WBC: 3.9 10*3/uL — ABNORMAL LOW (ref 4.0–10.5)
nRBC: 0 % (ref 0.0–0.2)

## 2020-09-15 LAB — RENAL FUNCTION PANEL
Albumin: 2.9 g/dL — ABNORMAL LOW (ref 3.5–5.0)
Anion gap: 9 (ref 5–15)
BUN: 29 mg/dL — ABNORMAL HIGH (ref 8–23)
CO2: 23 mmol/L (ref 22–32)
Calcium: 9 mg/dL (ref 8.9–10.3)
Chloride: 105 mmol/L (ref 98–111)
Creatinine, Ser: 1.77 mg/dL — ABNORMAL HIGH (ref 0.44–1.00)
GFR, Estimated: 32 mL/min — ABNORMAL LOW (ref >60)
Glucose, Bld: 187 mg/dL — ABNORMAL HIGH (ref 70–99)
Phosphorus: 2.4 mg/dL — ABNORMAL LOW (ref 2.5–4.6)
Potassium: 3.7 mmol/L (ref 3.5–5.1)
Sodium: 137 mmol/L (ref 135–145)

## 2020-09-15 LAB — HEPATIC FUNCTION PANEL
ALT: 59 U/L — ABNORMAL HIGH (ref 0–44)
AST: 19 U/L (ref 15–41)
Albumin: 2.9 g/dL — ABNORMAL LOW (ref 3.5–5.0)
Alkaline Phosphatase: 164 U/L — ABNORMAL HIGH (ref 38–126)
Bilirubin, Direct: 0.2 mg/dL (ref 0.0–0.2)
Indirect Bilirubin: 1.1 mg/dL — ABNORMAL HIGH (ref 0.3–0.9)
Total Bilirubin: 1.3 mg/dL — ABNORMAL HIGH (ref 0.3–1.2)
Total Protein: 5.4 g/dL — ABNORMAL LOW (ref 6.5–8.1)

## 2020-09-15 LAB — BRAIN NATRIURETIC PEPTIDE: B Natriuretic Peptide: 80 pg/mL (ref 0.0–100.0)

## 2020-09-15 LAB — CK: Total CK: 37 U/L — ABNORMAL LOW (ref 38–234)

## 2020-09-15 NOTE — Progress Notes (Addendum)
Patient ID: Tanajah Boulter, female   DOB: [birth year], 65 y.o.   MRN: [id number] [facility] Progress Note   Assessment/ Plan:   1. Acute kidney Injury on chronic kidney disease stage III T: Status post cadaveric renal transplant in 2016 with baseline creatinine possibly around 1.6-2.0.  Now back on tacrolimus and Myfortic after temporarily holding these medications to help improve efforts at infection control/improvement of respiratory status.  Creatinine improved at 1.77 from 1.95 two days ago.  300 cc +2 times unmeasured urine output documented. 2.  COVID-19 pneumonia with hypoxic respiratory failure: Now outside of isolation for COVID-19.  Continues on 30 L high flow nasal cannula. 3.  Anemia: Secondary to anemia of chronic kidney disease and likely exacerbated by acute/critical illness with frequent blood draws.  We will continue on Aranesp.  Transfusion threshold less than 7. 4.  Hypertension: Current BP meds include Norvasc 10, hydralazine 25 mg every 8 hours, patient is also on Imdur 60 mg/day.  Most recent blood pressure 133/73. 5.  Hypophosphatemia: Most recent phosphorus 2.4.  Subjective:   Patient states that she has a significant cough but that she feels her breathing is somewhat better.  Patient currently satting well on 30 L high flow nasal cannula.  She states she has been urinating well and has not noted any swelling in her extremities.   Objective:   BP 133/73   Pulse 95   Temp 98.9 F (37.2 C) (Oral)   Resp (!) 21   Ht 5\' 2"  (1.575 m)   Wt 84.1 kg   SpO2 95%   BMI 33.91 kg/m   Intake/Output Summary (Last 24 hours) at 09/15/2020 9449 Last data filed at 09/14/2020 2230 Gross per 24 hour  Intake 640 ml  Output 300 ml  Net 340 ml   Weight change: -1.2 kg  Physical Exam: General: Alert and oriented in no apparent distress, HFNC in place Heart: Tachycardic rate with no murmurs.  No edema noted in the lower extremities. Lungs: High flow nasal  cannula in place, no respiratory distress, patient with good air movement and some crackles present in the bilateral bases, significant cough present. Skin: Warm and dry  Imaging: No results found.  Labs: BMET Recent Labs  Lab 09/09/20 0136 09/10/20 0318 09/11/20 0155 09/12/20 0113 09/13/20 0140 09/14/20 1009 09/15/20 0028  NA 139 138 137 138 136 138 137  K 4.0 4.3 4.6 4.1 3.8 3.7 3.7  CL 103 105 106 103 102 106 105  CO2 24 23 24 25 24 23 23   GLUCOSE 111* 81 188* 123* 178* 142* 187*  BUN 59* 50* 47* 38* 37* 29* 29*  CREATININE 2.46* 2.15* 2.02* 1.93* 1.95* 1.74* 1.77*  CALCIUM 9.8 10.0 9.7 9.8 9.4 9.5 9.0  PHOS  --   --   --   --  2.2* 1.9* 2.4*   CBC Recent Labs  Lab 09/09/20 0136 09/10/20 0318 09/11/20 0155 09/12/20 0113 09/13/20 0140 09/15/20 0028  WBC 15.4* 10.0 8.2 8.3 7.1 3.9*  NEUTROABS 14.5* 9.4* 7.7 7.7  --   --   HGB 7.1* 7.2* 7.2* 8.6* 8.8* 9.1*  HCT 21.1* 20.9* 21.0* 26.0* 26.4* 28.2*  MCV 79.0* 80.1 80.5 81.8 82.0 83.9  PLT 99* 92* 88* 91* 92* 93*   Medications:    . amLODipine  10 mg Oral Daily  . vitamin C  500 mg Oral Daily  . aspirin EC  81 mg Oral Daily  . darbepoetin (ARANESP) injection - NON-DIALYSIS  150  mcg Subcutaneous Q Tue-1800  . enoxaparin (LOVENOX) injection  30 mg Subcutaneous Q24H  . feeding supplement (NEPRO CARB STEADY)  237 mL Oral BID BM  . fluticasone  1 spray Each Nare Daily  . folic acid  1 mg Oral Daily  . hydrALAZINE  25 mg Oral Q8H  . insulin aspart  0-15 Units Subcutaneous TID WC  . insulin detemir  8 Units Subcutaneous BID  . isosorbide mononitrate  60 mg Oral Daily  . levothyroxine  50 mcg Oral Q0600  . LORazepam  1 mg Oral BID  . mouth rinse  15 mL Mouth Rinse BID  . mycophenolate  360 mg Oral BID  . pantoprazole  40 mg Oral BID  . predniSONE  5 mg Oral Q breakfast  . tacrolimus  3 mg Oral QPM  . tacrolimus  4 mg Oral Daily  . zinc sulfate  220 mg Oral Daily   Lurline Del, DO 09/15/2020, 8:08 AM

## 2020-09-15 NOTE — Progress Notes (Signed)
PROGRESS NOTE    [name] [name]  OYD:[id number] DOB: 09-12-1955 DOA: 08/18/2020 PCP: Glendale Chard, MD   Chief Complain: Chest pain, weakness, Covid positive  Brief Narrative: Patient is a 41 old female with history of renal transplant x2 on chronic immunosuppression agents, diabetes, hypothyroidism who presented with shortness of breath initially.  She was found to be in acute hypoxic respiratory failure due to Covid pneumonia.  Hospital course was prolonged and was complicated by slow improvement from severe hypoxia, AKI and non-STEMI.  Continues to require high flow oxygen.  Currently she is DNR.  If she worsens, plan is for comfort care.  Assessment & Plan:   Principal Problem:   ARF (acute renal failure) (HCC) Active Problems:   AKI (acute kidney injury) ([place])   Renal transplant recipient   Immunocompromised ([place])   DM (diabetes mellitus), type 2 with renal complications ([place])   Essential hypertension with goal blood pressure less than 140/90   Acute hypoxemic respiratory failure due to COVID-19 ([place])   Respiratory failure (HCC)   Acute hyperkalemia   Diabetic ketoacidosis without coma associated with type 1 diabetes mellitus ([place])   Pressure injury of skin   Acute hypoxic respiratory failure due to Covid pneumonia: Fully vaccinated patient.  Immunocompromised status due to renal transplant, immunosuppressive medications.  Initially presented with chest pain, shortness of breath.  Found to have multifocal pneumonia from Covid.  Completed treatment with IV steroids, baricitinib, remdesivir.  Also was treated with IV antibiotics for superimposed bacterial pneumonia.  Despite aggressive measures, she remains severely hypoxic.  Several discussion was held with family/patient about goals of care.  Currently DNR.  If declines or worsens, possible consideration of comfort care measures(family aware and agreeable).  Continue pulmonary toileting, incentive spirometer.   Mobilization Off isolation  Sinus pause:Happened on  1/20 with 4.5 seconds pause.  Beta-blocker discontinued.  Currently stable.  NSTEMI: Patient with chest pain on 1/23 with EKG changes.  Treated with  Nitroglycerin, morphine .  Echo is reassuring with preserved ejection fraction.  On aspirin/statin, completed 48 hours of IV heparin.  Cardiology signed off.  AKI on CKD stage IV/transplant kidney: Transplant x2.  AKI was most likely hemodynamically mediated.  Creatinine has remained acutely around 1.7.  Nephrology was following.  Immunosuppression has been resumed: On Myfortic, Prograf, prednisone.  Nephrology has signed off.  Hyperkalemia: Resolved  Transaminitis: LFTs trending down.  Right upper quadrant ultrasound is nonacute and showed some sludge/gallstones but no signs of cholecystitis.  Hepatitis panel negative.  Patient is asymptomatic.  Continue to monitor intermittently.  Chronic normocytic anemia: Most likely associated  with CKD.  No signs of bleeding.  Status post 1 unit of PRBC transfusion on 09/11/2020.  Continue monitoring hemoglobin intermittently  Thrombocytopenia: Most likely associated with acute illness.  Continue to monitor  Complicated UTI: Completed course of Rocephin.  Hypothyroidism: Continue Synthyroid.  Repeat TFT in 3 months  Hypertension: Currently blood pressure stable.  Continue medications  Hyperlipidemia: Statin held due to elevated LFTs.  Currently stable  Obesity: BMI 33.9  DKA: Resolved.  No longer on insulin drip.  Continue current insulin regimen.  Hemoglobin A1c of 8.2 as per 1/11.  Monitor blood sugars, mainly while on prednisone.  Deconditioning/debility: PT/OT recommended  skilled nursing facility on discharge.  Pressure Injury 08/20/20 Coccyx Mid Stage 1 -  Intact skin with non-blanchable redness of a localized area usually over a bony prominence. (Active)  08/20/20 0800  Location: Coccyx  Location Orientation: Mid  Staging: Stage  1 -   Intact skin with non-blanchable redness of a localized area usually over a bony prominence.  Wound Description (Comments):   Present on Admission: Yes             DVT prophylaxis:Lovenox Code Status: DNR Family Communication: None at the bedside Status is: Inpatient  Remains inpatient appropriate because:Inpatient level of care appropriate due to severity of illness   Dispo: The patient is from: Home              Anticipated d/c is to: Skilled nursing facility              Anticipated d/c date is: > 3 days              Patient currently is not medically stable to d/c.   Difficult to place patient No    Consultants: None  Procedures:  Antimicrobials:  Anti-infectives (From admission, onward)   Start     Dose/Rate Route Frequency Ordered Stop   09/07/20 1030  vancomycin (VANCOCIN) IVPB 1000 mg/200 mL premix  Status:  Discontinued        1,000 mg 200 mL/hr over 60 Minutes Intravenous Every 48 hours 09/05/20 0933 09/07/20 0951   09/05/20 1030  vancomycin (VANCOREADY) IVPB 2000 mg/400 mL        2,000 mg 200 mL/hr over 120 Minutes Intravenous  Once 09/05/20 0933 09/05/20 2018   09/05/20 1030  ceFEPIme (MAXIPIME) 2 g in sodium chloride 0.9 % 100 mL IVPB  Status:  Discontinued        2 g 200 mL/hr over 30 Minutes Intravenous Every 24 hours 09/05/20 0933 09/07/20 0951   08/20/20 1445  cefTRIAXone (ROCEPHIN) 1 g in sodium chloride 0.9 % 100 mL IVPB        1 g 200 mL/hr over 30 Minutes Intravenous Every 24 hours 08/20/20 1348 08/26/20 2147   08/20/20 1000  remdesivir 100 mg in sodium chloride 0.9 % 100 mL IVPB       "Followed by" Linked Group Details   100 mg 200 mL/hr over 30 Minutes Intravenous Daily 08/19/20 0021 08/23/20 1113   08/19/20 0130  remdesivir 200 mg in sodium chloride 0.9% 250 mL IVPB       "Followed by" Linked Group Details   200 mg 580 mL/hr over 30 Minutes Intravenous Once 08/19/20 0021 08/19/20 0314      Subjective: Patient seen and examined at the  bedside this morning.  Hemodynamically stable during my evaluation.  She is slightly tachypneic, having some dry cough but states she feels better today  She was eating her breakfast.  Currently requiring 3 L of oxygen per minute.  She hopes that she will be able to go home soon.  Objective: Vitals:   09/15/20 0552 09/15/20 0757 09/15/20 0836 09/15/20 1130  BP: 133/73  125/68 118/62  Pulse:   100 99  Resp:   20 20  Temp:   98.7 F (37.1 C) 97.8 F (36.6 C)  TempSrc:   Axillary Oral  SpO2:  95% 94% 98%  Weight:      Height:        Intake/Output Summary (Last 24 hours) at 09/15/2020 1258 Last data filed at 09/14/2020 2230 Gross per 24 hour  Intake 440 ml  Output -  Net 440 ml   Filed Weights   09/13/20 0349 09/14/20 0325 09/15/20 0426  Weight: 85.3 kg 85.3 kg 84.1 kg    Examination:  General exam: Deconditioned, debilitated, generalized weakness, chronically  ill looking Respiratory system: Bilateral diminished breath sounds, rhonchi  cardiovascular system: S1 & S2 heard, RRR. No JVD, murmurs, rubs, gallops or clicks. No pedal edema. Gastrointestinal system: Abdomen is nondistended, soft and nontender. No organomegaly or masses felt. Normal bowel sounds heard. Central nervous system: Alert and oriented. No focal neurological deficits. Extremities: No edema, no clubbing ,no cyanosis Skin: No rashes, lesions or ulcers,no icterus ,no pallor   Data Reviewed: I have personally reviewed following labs and imaging studies  CBC: Recent Labs  Lab 09/09/20 0136 09/10/20 0318 09/11/20 0155 09/12/20 0113 09/13/20 0140 09/15/20 0028  WBC 15.4* 10.0 8.2 8.3 7.1 3.9*  NEUTROABS 14.5* 9.4* 7.7 7.7  --   --   HGB 7.1* 7.2* 7.2* 8.6* 8.8* 9.1*  HCT 21.1* 20.9* 21.0* 26.0* 26.4* 28.2*  MCV 79.0* 80.1 80.5 81.8 82.0 83.9  PLT 99* 92* 88* 91* 92* 93*   Basic Metabolic Panel: Recent Labs  Lab 09/11/20 0155 09/12/20 0113 09/13/20 0140 09/14/20 1009 09/15/20 0028  NA 137 138 136  138 137  K 4.6 4.1 3.8 3.7 3.7  CL 106 103 102 106 105  CO2 24 25 24 23 23   GLUCOSE 188* 123* 178* 142* 187*  BUN 47* 38* 37* 29* 29*  CREATININE 2.02* 1.93* 1.95* 1.74* 1.77*  CALCIUM 9.7 9.8 9.4 9.5 9.0  PHOS  --   --  2.2* 1.9* 2.4*   GFR: Estimated Creatinine Clearance: 31.9 mL/min (A) (by C-G formula based on SCr of 1.77 mg/dL (H)). Liver Function Tests: Recent Labs  Lab 09/09/20 0136 09/10/20 0318 09/11/20 0155 09/12/20 0113 09/13/20 0140 09/14/20 1009 09/15/20 0028  AST 110* 55* 38 27  --   --  19  ALT 245* 181* 149* 116*  --   --  59*  ALKPHOS 233* 215* 208* 203*  --   --  164*  BILITOT 1.3* 1.1 0.8 1.4*  --   --  1.3*  PROT 5.3* 5.6* 5.4* 5.7*  --   --  5.4*  ALBUMIN 2.8* 2.9* 3.0* 3.0* 3.0* 3.2* 2.9*  2.9*   No results for input(s): LIPASE, AMYLASE in the last 168 hours. No results for input(s): AMMONIA in the last 168 hours. Coagulation Profile: Recent Labs  Lab 09/10/20 0318 09/11/20 0155 09/12/20 0113 09/13/20 0140  INR 1.2 1.2 1.1 1.1   Cardiac Enzymes: Recent Labs  Lab 09/15/20 0028  CKTOTAL 37*   BNP (last 3 results) No results for input(s): PROBNP in the last 8760 hours. HbA1C: No results for input(s): HGBA1C in the last 72 hours. CBG: Recent Labs  Lab 09/14/20 1106 09/14/20 1603 09/14/20 2129 09/15/20 0557 09/15/20 1131  GLUCAP 163* 173* 186* 118* 205*   Lipid Profile: No results for input(s): CHOL, HDL, LDLCALC, TRIG, CHOLHDL, LDLDIRECT in the last 72 hours. Thyroid Function Tests: No results for input(s): TSH, T4TOTAL, FREET4, T3FREE, THYROIDAB in the last 72 hours. Anemia Panel: No results for input(s): VITAMINB12, FOLATE, FERRITIN, TIBC, IRON, RETICCTPCT in the last 72 hours. Sepsis Labs: Recent Labs  Lab 09/09/20 0136 09/10/20 0318 09/11/20 0155 09/12/20 0113  PROCALCITON 0.13 <0.10 <0.10 0.10    Recent Results (from the past 240 hour(s))  MRSA PCR Screening     Status: None   Collection Time: 09/07/20  9:37 AM    Specimen: Nasopharyngeal  Result Value Ref Range Status   MRSA by PCR NEGATIVE NEGATIVE Final    Comment:        The GeneXpert MRSA Assay (FDA approved for NASAL  specimens only), is one component of a comprehensive MRSA colonization surveillance program. It is not intended to diagnose MRSA infection nor to guide or monitor treatment for MRSA infections. Performed at [facility], [place] [address] [place], [place] [id number]          Radiology Studies: No results found.      Scheduled Meds: . amLODipine  10 mg Oral Daily  . vitamin C  500 mg Oral Daily  . aspirin EC  81 mg Oral Daily  . darbepoetin (ARANESP) injection - NON-DIALYSIS  150 mcg Subcutaneous Q Tue-1800  . enoxaparin (LOVENOX) injection  30 mg Subcutaneous Q24H  . feeding supplement (NEPRO CARB STEADY)  237 mL Oral BID BM  . fluticasone  1 spray Each Nare Daily  . folic acid  1 mg Oral Daily  . hydrALAZINE  25 mg Oral Q8H  . insulin aspart  0-15 Units Subcutaneous TID WC  . insulin detemir  8 Units Subcutaneous BID  . isosorbide mononitrate  60 mg Oral Daily  . levothyroxine  50 mcg Oral Q0600  . LORazepam  1 mg Oral BID  . mouth rinse  15 mL Mouth Rinse BID  . mycophenolate  360 mg Oral BID  . pantoprazole  40 mg Oral BID  . predniSONE  5 mg Oral Q breakfast  . tacrolimus  3 mg Oral QPM  . tacrolimus  4 mg Oral Daily  . zinc sulfate  220 mg Oral Daily   Continuous Infusions: . sodium chloride    . sodium chloride Stopped (08/31/20 1545)     LOS: 27 days    Time spent: 35 mins.More than 50% of that time was spent in counseling and/or coordination of care.      Shelly Coss, MD [facility] P2/02/2021, 12:58 PM

## 2020-09-15 NOTE — Care Management Important Message (Signed)
Important Message  Patient Details  Name: [name] [name] MRN: [id number] Date of Birth: 10/18/1955   Medicare Important Message Given:  Yes     Shelda Altes 09/15/2020, 10:40 AM

## 2020-09-16 DIAGNOSIS — N179 Acute kidney failure, unspecified: Secondary | ICD-10-CM | POA: Diagnosis not present

## 2020-09-16 LAB — GLUCOSE, CAPILLARY
Glucose-Capillary: 153 mg/dL — ABNORMAL HIGH (ref 70–99)
Glucose-Capillary: 153 mg/dL — ABNORMAL HIGH (ref 70–99)
Glucose-Capillary: 259 mg/dL — ABNORMAL HIGH (ref 70–99)
Glucose-Capillary: 131 mg/dL — ABNORMAL HIGH (ref 70–99)

## 2020-09-16 LAB — RENAL FUNCTION PANEL
Albumin: 2.8 g/dL — ABNORMAL LOW (ref 3.5–5.0)
Anion gap: 8 (ref 5–15)
BUN: 28 mg/dL — ABNORMAL HIGH (ref 8–23)
CO2: 22 mmol/L (ref 22–32)
Calcium: 8.8 mg/dL — ABNORMAL LOW (ref 8.9–10.3)
Chloride: 107 mmol/L (ref 98–111)
Creatinine, Ser: 1.86 mg/dL — ABNORMAL HIGH (ref 0.44–1.00)
GFR, Estimated: 30 mL/min — ABNORMAL LOW (ref >60)
Glucose, Bld: 168 mg/dL — ABNORMAL HIGH (ref 70–99)
Phosphorus: 2.3 mg/dL — ABNORMAL LOW (ref 2.5–4.6)
Potassium: 3.5 mmol/L (ref 3.5–5.1)
Sodium: 137 mmol/L (ref 135–145)

## 2020-09-16 NOTE — Progress Notes (Signed)
Occupational Therapy Treatment Patient Details Name: [name] [name] MRN: [id number] DOB: 16-Nov-1955 Today's Date: 09/16/2020    History of present illness Pt is a 65 y.o. female who tested (+) COVID-19 on 08/13/20, now admitted 08/18/20 with worsening SOB, cough, chest pain and body aches. Workup for acute hypoxic respiratory failure due to COVID-19 PNA, DKA, AKI on CKD, UTI. PMH includes HTN, DM2, chronic steroid use, s/p renal transplant x2.Episode of CP 1/22; per Cardiology: troponin, EKG and history felt concerning for [place] / possibly NSTEMI - difficult to know in context of multiple medical stressors   OT comments  Pt continues with excellent effort and participation, limited significantly this date by tolerance and SOB with activity. On arrival SpO2 94 and HR on 30L HHFNC, pt with good participation requiring only min A for supine<>Sitting EOB SpO2 with difficulty assessing d/t poor reading. Noted increased WOB and fatigue, tolerating <2 minutes with O2 dropping per reading to 81%, NRB on during session per RN requesting/suggestion to assist with desat with changes in positioning. Noted significantly decreased time for recovery back >=90% with return to supine and NRB on. At this time pt returned to supine d/t fatigue and poor O2 readings. Noted significant bruising to abdomen R>L, and pt endorsing at times discomfort to buttocks with pressure. Pt with overall excellent functional performance considering diminished tolerance for activity, with recommendation for changes in position throughout day with mobility/nursing staff with hopes of improving tolerance. OT will continue to follow acutely, with recommendations continuing to be for low intensity post acute therapy at time of d/c with strict 24hr S/A.    Follow Up Recommendations  SNF;LTACH;Supervision/Assistance - 24 hour    Equipment Recommendations  3 in 1 bedside commode    Recommendations for Other Services      Precautions /  Restrictions Precautions Precautions: Fall Precaution Comments: Watch SpO2 - quick to desaturate on HHFNC Restrictions Weight Bearing Restrictions: No       Mobility Bed Mobility Overal bed mobility: Needs Assistance Bed Mobility: Supine to Sit;Sit to Supine     Supine to sit: Min assist Sit to supine: HOB elevated;Min assist   General bed mobility comments: deferred OOB transfer this date d/t difficulty with pulse ox inconsistently reading  Transfers                      Balance Overall balance assessment: Needs assistance Sitting-balance support: Feet supported;No upper extremity supported Sitting balance-Leahy Scale: Fair Sitting balance - Comments: tolerated static unsupported at EOB with noted increased WOB with sitting, DoE 4/4                                   ADL either performed or assessed with clinical judgement   ADL                                         General ADL Comments: Review and discussion at length on pursed lip breathing, slowing breath. use of incetivespirometer and energy conservation techniques during functional tasks completed with RN     Vision       Perception     Praxis      Cognition Arousal/Alertness: Awake/alert Behavior During Therapy: WFL for tasks assessed/performed Overall Cognitive Status: Within Functional Limits for tasks assessed Area of Impairment: Following commands;Problem solving;Awareness;Attention  Following Commands: Follows one step commands consistently;Follows one step commands with increased time   Awareness: Emergent   General Comments: pleasant, fair processing.        Exercises     Shoulder Instructions       General Comments 30L HHFNC with NRB on for transfer this date trial per RN request    Pertinent Vitals/ Pain       Pain Assessment: No/denies pain  Home Living                                           Prior Functioning/Environment              Frequency  Min 2X/week        Progress Toward Goals  OT Goals(current goals can now be found in the care plan section)  Progress towards OT goals: Not progressing toward goals - comment (despite excellent effort, pt continues to demo difficulty with progressing towards goals d/t SOB and tolerance limitations. will continue to assess appropriateness with each session.)  Acute Rehab OT Goals Patient Stated Goal: to be able to breathe better  Plan Discharge plan remains appropriate    Co-evaluation                 AM-PAC OT "6 Clicks" Daily Activity     Outcome Measure   Help from another person eating meals?: None Help from another person taking care of personal grooming?: A Little Help from another person toileting, which includes using toliet, bedpan, or urinal?: A Lot Help from another person bathing (including washing, rinsing, drying)?: A Lot Help from another person to put on and taking off regular upper body clothing?: A Lot Help from another person to put on and taking off regular lower body clothing?: A Lot 6 Click Score: 15    End of Session Equipment Utilized During Treatment: Oxygen  OT Visit Diagnosis: Unsteadiness on feet (R26.81)   Activity Tolerance Patient limited by fatigue   Patient Left in bed;with call bell/phone within reach   Nurse Communication Mobility status    Time: 5277-8242 OT Time Calculation (min): 32 min  Charges: OT General Charges $OT Visit: 1 Visit OT Treatments $Therapeutic Activity: 23-37 mins  Elzie Knisley OTR/L acute rehab services Office: [place] 09/16/2020, 10:03 AM

## 2020-09-16 NOTE — Progress Notes (Signed)
PT Cancellation Note  Patient Details Name: [name] [name] MRN: [id number] DOB: 1956/04/17   Cancelled Treatment:    Reason Eval/Treat Not Completed: Medical issues which prohibited therapy RN reporting OT just worked with pt and pt very fatigued. Requesting to come back at a later time. Will follow up as schedule allows.   Lou Miner, DPT  Acute Rehabilitation Services  Pager: [phone] Office: [phone]    Rudean Hitt 09/16/2020, 10:37 AM

## 2020-09-16 NOTE — Progress Notes (Signed)
PROGRESS NOTE    [name] [name]  FTD:[id number] DOB: 07-18-1956 DOA: 08/18/2020 PCP: Glendale Chard, MD   Chief Complain: Chest pain, weakness, Covid positive  Brief Narrative: Patient is a 39 old female with history of renal transplant x2 on chronic immunosuppression agents, diabetes, hypothyroidism who presented with shortness of breath initially.  She was found to be in acute hypoxic respiratory failure due to Covid pneumonia.  Hospital course was prolonged and was complicated by slow improvement from severe hypoxia, AKI and non-STEMI.  Continues to require high flow oxygen.  Currently she is DNR.  If she worsens, plan is for comfort care.  Assessment & Plan:   Principal Problem:   ARF (acute renal failure) (HCC) Active Problems:   AKI (acute kidney injury) ([place])   Renal transplant recipient   Immunocompromised ([place])   DM (diabetes mellitus), type 2 with renal complications ([place])   Essential hypertension with goal blood pressure less than 140/90   Acute hypoxemic respiratory failure due to COVID-19 ([place])   Respiratory failure (HCC)   Acute hyperkalemia   Diabetic ketoacidosis without coma associated with type 1 diabetes mellitus ([place])   Pressure injury of skin   Acute hypoxic respiratory failure due to Covid pneumonia: Fully vaccinated patient.  Immunocompromised status due to renal transplant, immunosuppressive medications.  Initially presented with chest pain, shortness of breath.  Found to have multifocal pneumonia from Covid.  Completed treatment with IV steroids, baricitinib, remdesivir.  Also was treated with IV antibiotics for superimposed bacterial pneumonia.  Despite aggressive measures, she remains severely hypoxic.  Several discussion was held with family/patient about goals of care.  Currently DNR.  If declines or worsens, possible consideration of comfort care measures(family aware and agreeable).  Continue pulmonary toileting, incentive spirometer.  Mobilization  encouraged. Off isolation  Sinus pause:Happened on  1/20 with 4.5 seconds pause.  Beta-blocker discontinued.  Currently stable.  NSTEMI: Patient with chest pain on 1/23 with EKG changes.  Treated with  Nitroglycerin, morphine .  Echo is reassuring with preserved ejection fraction.  On aspirin/statin, completed 48 hours of IV heparin.  Cardiology signed off.  AKI on CKD stage IV/transplant kidney: Transplant x2.  AKI was most likely hemodynamically mediated.  Creatinine has remained acutely around 1.7.  Nephrology was following.  Immunosuppression has been resumed: On Myfortic, Prograf, prednisone.  Nephrology has signed off.  Hyperkalemia: Resolved  Transaminitis: LFTs trending down.  Right upper quadrant ultrasound is nonacute and showed some sludge/gallstones but no signs of cholecystitis.  Hepatitis panel negative.  Patient is asymptomatic.  Continue to monitor intermittently.  Chronic normocytic anemia: Most likely associated  with CKD.  No signs of bleeding.  Status post 1 unit of PRBC transfusion on 09/11/2020.  Continue monitoring hemoglobin intermittently  Thrombocytopenia: Most likely associated with acute illness.  Continue to monitor  Complicated UTI: Completed course of Rocephin.  Hypothyroidism: Continue Synthyroid.  Repeat TFT in 3 months  Hypertension: Currently blood pressure stable.  Continue medications  Hyperlipidemia: Statin held due to elevated LFTs.  Currently stable  Obesity: BMI 33.9  DKA: Resolved.  No longer on insulin drip.  Continue current insulin regimen.  Hemoglobin A1c of 8.2 as per 1/11.  Monitor blood sugars, mainly while on prednisone.  Deconditioning/debility: PT/OT recommended  skilled nursing facility on discharge.  Pressure Injury 08/20/20 Coccyx Mid Stage 1 -  Intact skin with non-blanchable redness of a localized area usually over a bony prominence. (Active)  08/20/20 0800  Location: Coccyx  Location Orientation: Mid  Staging:  Stage 1 -   Intact skin with non-blanchable redness of a localized area usually over a bony prominence.  Wound Description (Comments):   Present on Admission: Yes             DVT prophylaxis:Lovenox Code Status: DNR Family Communication: daughter on phone on 09/16/20 Status is: Inpatient  Remains inpatient appropriate because:Inpatient level of care appropriate due to severity of illness   Dispo: The patient is from: Home              Anticipated d/c is to: Skilled nursing facility              Anticipated d/c date is: > 3 days              Patient currently is not medically stable to d/c.   Difficult to place patient No    Consultants: None  Procedures:  Antimicrobials:  Anti-infectives (From admission, onward)   Start     Dose/Rate Route Frequency Ordered Stop   09/07/20 1030  vancomycin (VANCOCIN) IVPB 1000 mg/200 mL premix  Status:  Discontinued        1,000 mg 200 mL/hr over 60 Minutes Intravenous Every 48 hours 09/05/20 0933 09/07/20 0951   09/05/20 1030  vancomycin (VANCOREADY) IVPB 2000 mg/400 mL        2,000 mg 200 mL/hr over 120 Minutes Intravenous  Once 09/05/20 0933 09/05/20 2018   09/05/20 1030  ceFEPIme (MAXIPIME) 2 g in sodium chloride 0.9 % 100 mL IVPB  Status:  Discontinued        2 g 200 mL/hr over 30 Minutes Intravenous Every 24 hours 09/05/20 0933 09/07/20 0951   08/20/20 1445  cefTRIAXone (ROCEPHIN) 1 g in sodium chloride 0.9 % 100 mL IVPB        1 g 200 mL/hr over 30 Minutes Intravenous Every 24 hours 08/20/20 1348 08/26/20 2147   08/20/20 1000  remdesivir 100 mg in sodium chloride 0.9 % 100 mL IVPB       "Followed by" Linked Group Details   100 mg 200 mL/hr over 30 Minutes Intravenous Daily 08/19/20 0021 08/23/20 1113   08/19/20 0130  remdesivir 200 mg in sodium chloride 0.9% 250 mL IVPB       "Followed by" Linked Group Details   200 mg 580 mL/hr over 30 Minutes Intravenous Once 08/19/20 0021 08/19/20 0314      Subjective: Patient seen and examined  the bedside this morning.  Hemodynamically stable, same as yesterday.  Requiring 3 L of oxygen per minute.  Denies any worsening dyspnea.  Objective: Vitals:   09/16/20 0637 09/16/20 0656 09/16/20 0725 09/16/20 0733  BP:   138/71   Pulse:  (!) 108 100   Resp:  19 20   Temp:   97.8 F (36.6 C)   TempSrc:   Oral   SpO2:  94% 91% 93%  Weight: 82.9 kg     Height:       No intake or output data in the 24 hours ending 09/16/20 0745 Filed Weights   09/14/20 0325 09/15/20 0426 09/16/20 0637  Weight: 85.3 kg 84.1 kg 82.9 kg    Examination:  General exam: Very deconditioned, debilitated, weak, chronically ill looking Respiratory system: Bilateral diminished air sounds, rhonchi  cardiovascular system: S1 & S2 heard, RRR. No JVD, murmurs, rubs, gallops or clicks. Gastrointestinal system: Abdomen is nondistended, soft and nontender. No organomegaly or masses felt. Normal bowel sounds heard. Central nervous system: Alert and oriented. No focal  neurological deficits. Extremities: No edema, no clubbing ,no cyanosis Skin: No rashes, lesions or ulcers,no icterus ,no pallor   Data Reviewed: I have personally reviewed following labs and imaging studies  CBC: Recent Labs  Lab 09/10/20 0318 09/11/20 0155 09/12/20 0113 09/13/20 0140 09/15/20 0028  WBC 10.0 8.2 8.3 7.1 3.9*  NEUTROABS 9.4* 7.7 7.7  --   --   HGB 7.2* 7.2* 8.6* 8.8* 9.1*  HCT 20.9* 21.0* 26.0* 26.4* 28.2*  MCV 80.1 80.5 81.8 82.0 83.9  PLT 92* 88* 91* 92* 93*   Basic Metabolic Panel: Recent Labs  Lab 09/12/20 0113 09/13/20 0140 09/14/20 1009 09/15/20 0028 09/16/20 0203  NA 138 136 138 137 137  K 4.1 3.8 3.7 3.7 3.5  CL 103 102 106 105 107  CO2 25 24 23 23 22   GLUCOSE 123* 178* 142* 187* 168*  BUN 38* 37* 29* 29* 28*  CREATININE 1.93* 1.95* 1.74* 1.77* 1.86*  CALCIUM 9.8 9.4 9.5 9.0 8.8*  PHOS  --  2.2* 1.9* 2.4* 2.3*   GFR: Estimated Creatinine Clearance: 30.1 mL/min (A) (by C-G formula based on SCr of 1.86  mg/dL (H)). Liver Function Tests: Recent Labs  Lab 09/10/20 0318 09/11/20 0155 09/12/20 0113 09/13/20 0140 09/14/20 1009 09/15/20 0028 09/16/20 0203  AST 55* 38 27  --   --  19  --   ALT 181* 149* 116*  --   --  59*  --   ALKPHOS 215* 208* 203*  --   --  164*  --   BILITOT 1.1 0.8 1.4*  --   --  1.3*  --   PROT 5.6* 5.4* 5.7*  --   --  5.4*  --   ALBUMIN 2.9* 3.0* 3.0* 3.0* 3.2* 2.9*  2.9* 2.8*   No results for input(s): LIPASE, AMYLASE in the last 168 hours. No results for input(s): AMMONIA in the last 168 hours. Coagulation Profile: Recent Labs  Lab 09/10/20 0318 09/11/20 0155 09/12/20 0113 09/13/20 0140  INR 1.2 1.2 1.1 1.1   Cardiac Enzymes: Recent Labs  Lab 09/15/20 0028  CKTOTAL 37*   BNP (last 3 results) No results for input(s): PROBNP in the last 8760 hours. HbA1C: No results for input(s): HGBA1C in the last 72 hours. CBG: Recent Labs  Lab 09/15/20 0557 09/15/20 1131 09/15/20 1630 09/15/20 2202 09/16/20 0631  GLUCAP 118* 205* 236* 181* 153*   Lipid Profile: No results for input(s): CHOL, HDL, LDLCALC, TRIG, CHOLHDL, LDLDIRECT in the last 72 hours. Thyroid Function Tests: No results for input(s): TSH, T4TOTAL, FREET4, T3FREE, THYROIDAB in the last 72 hours. Anemia Panel: No results for input(s): VITAMINB12, FOLATE, FERRITIN, TIBC, IRON, RETICCTPCT in the last 72 hours. Sepsis Labs: Recent Labs  Lab 09/10/20 0318 09/11/20 0155 09/12/20 0113  PROCALCITON <0.10 <0.10 0.10    Recent Results (from the past 240 hour(s))  MRSA PCR Screening     Status: None   Collection Time: 09/07/20  9:37 AM   Specimen: Nasopharyngeal  Result Value Ref Range Status   MRSA by PCR NEGATIVE NEGATIVE Final    Comment:        The GeneXpert MRSA Assay (FDA approved for NASAL specimens only), is one component of a comprehensive MRSA colonization surveillance program. It is not intended to diagnose MRSA infection nor to guide or monitor treatment for MRSA  infections. Performed at [facility], [place] [address], [place], [place] [address]          Radiology Studies: No results  found.      Scheduled Meds: . amLODipine  10 mg Oral Daily  . vitamin C  500 mg Oral Daily  . aspirin EC  81 mg Oral Daily  . darbepoetin (ARANESP) injection - NON-DIALYSIS  150 mcg Subcutaneous Q Tue-1800  . enoxaparin (LOVENOX) injection  30 mg Subcutaneous Q24H  . feeding supplement (NEPRO CARB STEADY)  237 mL Oral BID BM  . fluticasone  1 spray Each Nare Daily  . folic acid  1 mg Oral Daily  . hydrALAZINE  25 mg Oral Q8H  . insulin aspart  0-15 Units Subcutaneous TID WC  . insulin detemir  8 Units Subcutaneous BID  . isosorbide mononitrate  60 mg Oral Daily  . levothyroxine  50 mcg Oral Q0600  . LORazepam  1 mg Oral BID  . mouth rinse  15 mL Mouth Rinse BID  . mycophenolate  360 mg Oral BID  . pantoprazole  40 mg Oral BID  . predniSONE  5 mg Oral Q breakfast  . tacrolimus  3 mg Oral QPM  . tacrolimus  4 mg Oral Daily  . zinc sulfate  220 mg Oral Daily   Continuous Infusions: . sodium chloride    . sodium chloride Stopped (08/31/20 1545)     LOS: 28 days    Time spent: 35 mins.More than 50% of that time was spent in counseling and/or coordination of care.      Shelly Coss, MD [facility] P2/03/2021, 7:45 AM

## 2020-09-16 NOTE — Progress Notes (Signed)
Pt found with kink in HFNC and flow was being blocked.  Issue corrected at this time.  Pt may be able to titrate flow/FiO2 as long as cannula remains unkinked.  RT will continue to monitor.

## 2020-09-17 DIAGNOSIS — N179 Acute kidney failure, unspecified: Secondary | ICD-10-CM | POA: Diagnosis not present

## 2020-09-17 LAB — RENAL FUNCTION PANEL
Albumin: 2.9 g/dL — ABNORMAL LOW (ref 3.5–5.0)
Anion gap: 8 (ref 5–15)
BUN: 24 mg/dL — ABNORMAL HIGH (ref 8–23)
CO2: 22 mmol/L (ref 22–32)
Calcium: 9 mg/dL (ref 8.9–10.3)
Chloride: 108 mmol/L (ref 98–111)
Creatinine, Ser: 1.75 mg/dL — ABNORMAL HIGH (ref 0.44–1.00)
GFR, Estimated: 32 mL/min — ABNORMAL LOW (ref >60)
Glucose, Bld: 140 mg/dL — ABNORMAL HIGH (ref 70–99)
Phosphorus: 2 mg/dL — ABNORMAL LOW (ref 2.5–4.6)
Potassium: 3.8 mmol/L (ref 3.5–5.1)
Sodium: 138 mmol/L (ref 135–145)

## 2020-09-17 LAB — GLUCOSE, CAPILLARY
Glucose-Capillary: 115 mg/dL — ABNORMAL HIGH (ref 70–99)
Glucose-Capillary: 188 mg/dL — ABNORMAL HIGH (ref 70–99)
Glucose-Capillary: 397 mg/dL — ABNORMAL HIGH (ref 70–99)
Glucose-Capillary: 126 mg/dL — ABNORMAL HIGH (ref 70–99)

## 2020-09-17 MED ORDER — ENOXAPARIN SODIUM 40 MG/0.4ML ~~LOC~~ SOLN
40.0000 mg | SUBCUTANEOUS | Status: DC
  Administered 2020-09-17 – 2020-09-19 (×3): 40 mg via SUBCUTANEOUS
  Filled 2020-09-17 (×3): qty 0.4

## 2020-09-17 NOTE — Progress Notes (Signed)
PROGRESS NOTE    [name] [name]  PVV:[id number] DOB: 02/16/1956 DOA: 08/18/2020 PCP: Glendale Chard, MD   Chief Complain: Chest pain, weakness, Covid positive  Brief Narrative: Patient is a 25 old female with history of renal transplant x2 on chronic immunosuppression agents, diabetes, hypothyroidism who presented with shortness of breath initially.  She was found to be in acute hypoxic respiratory failure due to Covid pneumonia.  Hospital course was prolonged and was complicated by slow improvement from severe hypoxia, AKI and non-STEMI.  Continues to require high flow oxygen.  Currently she is DNR.  If she worsens, plan is for comfort care.  Assessment & Plan:   Principal Problem:   ARF (acute renal failure) (HCC) Active Problems:   AKI (acute kidney injury) ([place])   Renal transplant recipient   Immunocompromised ([place])   DM (diabetes mellitus), type 2 with renal complications ([place])   Essential hypertension with goal blood pressure less than 140/90   Acute hypoxemic respiratory failure due to COVID-19 ([place])   Respiratory failure (HCC)   Acute hyperkalemia   Diabetic ketoacidosis without coma associated with type 1 diabetes mellitus ([place])   Pressure injury of skin   Acute hypoxic respiratory failure due to Covid pneumonia: Fully vaccinated patient.  Immunocompromised status due to renal transplant, immunosuppressive medications.  Initially presented with chest pain, shortness of breath.  Found to have multifocal pneumonia from Covid.  Completed treatment with IV steroids, baricitinib, remdesivir.  Also was treated with IV antibiotics for superimposed bacterial pneumonia.  Despite aggressive measures, she remains severely hypoxic.  Several discussion was held with family/patient about goals of care.  Currently DNR.  If declines or worsens, possible consideration of comfort care measures(family aware and agreeable).  Continue pulmonary toileting, incentive spirometer.  Mobilization  encouraged. Off isolation Today she is down to 20 L of oxygen per minute.  Sinus pause:Happened on  1/20 with 4.5 seconds pause.  Beta-blocker discontinued.  Currently stable.  NSTEMI: Patient with chest pain on 1/23 with EKG changes.  Treated with  Nitroglycerin, morphine .  Echo is reassuring with preserved ejection fraction.  On aspirin/statin, completed 48 hours of IV heparin.  Cardiology signed off.  AKI on CKD stage IV/transplant kidney: Transplant x2.  AKI was most likely hemodynamically mediated.  Creatinine has remained acutely around 1.7.  Nephrology was following.  Immunosuppression has been resumed: On Myfortic, Prograf, prednisone.  Nephrology has signed off.  Hyperkalemia: Resolved  Transaminitis: LFTs trending down.  Right upper quadrant ultrasound is nonacute and showed some sludge/gallstones but no signs of cholecystitis.  Hepatitis panel negative.  Patient is asymptomatic.  Continue to monitor intermittently.  Chronic normocytic anemia: Most likely associated  with CKD.  No signs of bleeding.  Status post 1 unit of PRBC transfusion on 09/11/2020.  Continue monitoring hemoglobin intermittently  Thrombocytopenia: Most likely associated with acute illness.  Continue to monitor  Complicated UTI: Completed course of Rocephin.  Hypothyroidism: Continue Synthyroid.  Repeat TFT in 3 months  Hypertension: Currently blood pressure stable.  Continue medications  Hyperlipidemia: Statin held due to elevated LFTs.  Currently stable  Obesity: BMI 33.9  DKA: Resolved.  No longer on insulin drip.  Continue current insulin regimen.  Hemoglobin A1c of 8.2 as per 1/11.  Monitor blood sugars, mainly while on prednisone.  Deconditioning/debility: PT/OT recommended  skilled nursing facility on discharge.  Pressure Injury 08/20/20 Coccyx Mid Stage 1 -  Intact skin with non-blanchable redness of a localized area usually over a bony prominence. (Active)  08/20/20 0800  Location: Coccyx   Location Orientation: Mid  Staging: Stage 1 -  Intact skin with non-blanchable redness of a localized area usually over a bony prominence.  Wound Description (Comments):   Present on Admission: Yes             DVT prophylaxis:Lovenox Code Status: DNR Family Communication: daughter on phone on 09/16/20 Status is: Inpatient  Remains inpatient appropriate because:Inpatient level of care appropriate due to severity of illness   Dispo: The patient is from: Home              Anticipated d/c is to: Skilled nursing facility              Anticipated d/c date is: > 3 days              Patient currently is not medically stable to d/c.   Difficult to place patient No    Consultants: None  Procedures:  Antimicrobials:  Anti-infectives (From admission, onward)   Start     Dose/Rate Route Frequency Ordered Stop   09/07/20 1030  vancomycin (VANCOCIN) IVPB 1000 mg/200 mL premix  Status:  Discontinued        1,000 mg 200 mL/hr over 60 Minutes Intravenous Every 48 hours 09/05/20 0933 09/07/20 0951   09/05/20 1030  vancomycin (VANCOREADY) IVPB 2000 mg/400 mL        2,000 mg 200 mL/hr over 120 Minutes Intravenous  Once 09/05/20 0933 09/05/20 2018   09/05/20 1030  ceFEPIme (MAXIPIME) 2 g in sodium chloride 0.9 % 100 mL IVPB  Status:  Discontinued        2 g 200 mL/hr over 30 Minutes Intravenous Every 24 hours 09/05/20 0933 09/07/20 0951   08/20/20 1445  cefTRIAXone (ROCEPHIN) 1 g in sodium chloride 0.9 % 100 mL IVPB        1 g 200 mL/hr over 30 Minutes Intravenous Every 24 hours 08/20/20 1348 08/26/20 2147   08/20/20 1000  remdesivir 100 mg in sodium chloride 0.9 % 100 mL IVPB       "Followed by" Linked Group Details   100 mg 200 mL/hr over 30 Minutes Intravenous Daily 08/19/20 0021 08/23/20 1113   08/19/20 0130  remdesivir 200 mg in sodium chloride 0.9% 250 mL IVPB       "Followed by" Linked Group Details   200 mg 580 mL/hr over 30 Minutes Intravenous Once 08/19/20 0021 08/19/20  0314      Subjective: Patient seen and examined at the bedside this morning.  She actually appears little better than yesterday.  She also feels slightly better.  Down to 20 L of oxygen per minute.No other complains  Objective: Vitals:   09/17/20 0400 09/17/20 0500 09/17/20 0600 09/17/20 0747  BP: 127/60 130/68 135/67 (!) 155/73  Pulse: 96 96 (!) 103 (!) 109  Resp: (!) 28 (!) 30 (!) 27 (!) 29  Temp:    98.3 F (36.8 C)  TempSrc:    Oral  SpO2: 92% 95% 91% 92%  Weight:      Height:       No intake or output data in the 24 hours ending 09/17/20 0750 Filed Weights   09/15/20 0426 09/16/20 0637 09/17/20 0345  Weight: 84.1 kg 82.9 kg 82.9 kg    Examination:  General exam: Very deconditioned, debilitated, chronically ill looking, obese Respiratory system: Bilateral diminished air sounds, rhonchi  cardiovascular system: S1 & S2 heard, RRR. No JVD, murmurs, rubs, gallops or clicks. Gastrointestinal system:  Abdomen is nondistended, soft and nontender. No organomegaly or masses felt. Normal bowel sounds heard. Central nervous system: Alert and oriented. No focal neurological deficits. Extremities: No edema, no clubbing ,no cyanosis Skin: No rashes, lesions or ulcers,no icterus ,no pallor  Data Reviewed: I have personally reviewed following labs and imaging studies  CBC: Recent Labs  Lab 09/11/20 0155 09/12/20 0113 09/13/20 0140 09/15/20 0028  WBC 8.2 8.3 7.1 3.9*  NEUTROABS 7.7 7.7  --   --   HGB 7.2* 8.6* 8.8* 9.1*  HCT 21.0* 26.0* 26.4* 28.2*  MCV 80.5 81.8 82.0 83.9  PLT 88* 91* 92* 93*   Basic Metabolic Panel: Recent Labs  Lab 09/13/20 0140 09/14/20 1009 09/15/20 0028 09/16/20 0203 09/17/20 0031  NA 136 138 137 137 138  K 3.8 3.7 3.7 3.5 3.8  CL 102 106 105 107 108  CO2 24 23 23 22 22   GLUCOSE 178* 142* 187* 168* 140*  BUN 37* 29* 29* 28* 24*  CREATININE 1.95* 1.74* 1.77* 1.86* 1.75*  CALCIUM 9.4 9.5 9.0 8.8* 9.0  PHOS 2.2* 1.9* 2.4* 2.3* 2.0*    GFR: Estimated Creatinine Clearance: 32 mL/min (A) (by C-G formula based on SCr of 1.75 mg/dL (H)). Liver Function Tests: Recent Labs  Lab 09/11/20 0155 09/12/20 0113 09/13/20 0140 09/14/20 1009 09/15/20 0028 09/16/20 0203 09/17/20 0031  AST 38 27  --   --  19  --   --   ALT 149* 116*  --   --  59*  --   --   ALKPHOS 208* 203*  --   --  164*  --   --   BILITOT 0.8 1.4*  --   --  1.3*  --   --   PROT 5.4* 5.7*  --   --  5.4*  --   --   ALBUMIN 3.0* 3.0* 3.0* 3.2* 2.9*  2.9* 2.8* 2.9*   No results for input(s): LIPASE, AMYLASE in the last 168 hours. No results for input(s): AMMONIA in the last 168 hours. Coagulation Profile: Recent Labs  Lab 09/11/20 0155 09/12/20 0113 09/13/20 0140  INR 1.2 1.1 1.1   Cardiac Enzymes: Recent Labs  Lab 09/15/20 0028  CKTOTAL 37*   BNP (last 3 results) No results for input(s): PROBNP in the last 8760 hours. HbA1C: No results for input(s): HGBA1C in the last 72 hours. CBG: Recent Labs  Lab 09/16/20 0631 09/16/20 1121 09/16/20 1615 09/16/20 2129 09/17/20 0616  GLUCAP 153* 153* 259* 131* 115*   Lipid Profile: No results for input(s): CHOL, HDL, LDLCALC, TRIG, CHOLHDL, LDLDIRECT in the last 72 hours. Thyroid Function Tests: No results for input(s): TSH, T4TOTAL, FREET4, T3FREE, THYROIDAB in the last 72 hours. Anemia Panel: No results for input(s): VITAMINB12, FOLATE, FERRITIN, TIBC, IRON, RETICCTPCT in the last 72 hours. Sepsis Labs: Recent Labs  Lab 09/11/20 0155 09/12/20 0113  PROCALCITON <0.10 0.10    Recent Results (from the past 240 hour(s))  MRSA PCR Screening     Status: None   Collection Time: 09/07/20  9:37 AM   Specimen: Nasopharyngeal  Result Value Ref Range Status   MRSA by PCR NEGATIVE NEGATIVE Final    Comment:        The GeneXpert MRSA Assay (FDA approved for NASAL specimens only), is one component of a comprehensive MRSA colonization surveillance program. It is not intended to diagnose  MRSA infection nor to guide or monitor treatment for MRSA infections. Performed at [facility], [place] [address]  [place], [place] [address]          Radiology Studies: No results found.      Scheduled Meds: . amLODipine  10 mg Oral Daily  . vitamin C  500 mg Oral Daily  . aspirin EC  81 mg Oral Daily  . darbepoetin (ARANESP) injection - NON-DIALYSIS  150 mcg Subcutaneous Q Tue-1800  . enoxaparin (LOVENOX) injection  40 mg Subcutaneous Q24H  . feeding supplement (NEPRO CARB STEADY)  237 mL Oral BID BM  . fluticasone  1 spray Each Nare Daily  . folic acid  1 mg Oral Daily  . hydrALAZINE  25 mg Oral Q8H  . insulin aspart  0-15 Units Subcutaneous TID WC  . insulin detemir  8 Units Subcutaneous BID  . isosorbide mononitrate  60 mg Oral Daily  . levothyroxine  50 mcg Oral Q0600  . LORazepam  1 mg Oral BID  . mouth rinse  15 mL Mouth Rinse BID  . mycophenolate  360 mg Oral BID  . pantoprazole  40 mg Oral BID  . predniSONE  5 mg Oral Q breakfast  . tacrolimus  3 mg Oral QPM  . tacrolimus  4 mg Oral Daily  . zinc sulfate  220 mg Oral Daily   Continuous Infusions: . sodium chloride    . sodium chloride Stopped (08/31/20 1545)     LOS: 29 days    Time spent: 35 mins.More than 50% of that time was spent in counseling and/or coordination of care.      Shelly Coss, MD [facility] P2/04/2021, 7:50 AM

## 2020-09-17 NOTE — Progress Notes (Addendum)
Physical Therapy Treatment Patient Details Name: [name] [name] MRN: [id number] DOB: 1956/01/21 Today's Date: 09/17/2020    History of Present Illness Pt is a 65 y.o. female who tested (+) COVID-19 on 08/13/20, now admitted 08/18/20 with worsening SOB, cough, chest pain and body aches. Workup for acute hypoxic respiratory failure due to COVID-19 PNA, DKA, AKI on CKD, UTI. PMH includes HTN, DM2, chronic steroid use, s/p renal transplant x2.Episode of CP 1/22; per Cardiology: troponin, EKG and history felt concerning for [place] / possibly NSTEMI - difficult to know in context of multiple medical stressors    PT Comments    Pt found slid down across her bed asleep with SaO2 90%O2 on 20 L HHFNC, but RR 45 and HR 120 bpm. Woke pt up and she explained she had tried to sit up for lunch and had so much difficulty with setting it up and eating that she laid back down exhausted barely getting LE back in bed. With RN assist able to pull pt back up in the bed and recline to approximately 110 degrees which seems to be optimal for her oxygenation. Worked on increased deep breathing and pt able to decrease HR to 110 bpm, and RR to 29 bpm. Pt goals reviewed and adjusted accordingly. D/c plan remains appropriate. PT will continue to follow acutely.     Follow Up Recommendations  SNF     Equipment Recommendations  Rolling walker with 5" wheels;3in1 (PT)       Precautions / Restrictions Precautions Precautions: Fall Precaution Comments: Watch SpO2 - quick to desaturate on HHFNC Restrictions Weight Bearing Restrictions: No    Mobility  Bed Mobility               General bed mobility comments: deferred due to increased work of breathing in supine            Cognition Arousal/Alertness: Awake/alert Behavior During Therapy: WFL for tasks assessed/performed Overall Cognitive Status: Within Functional Limits for tasks assessed Area of Impairment: Safety/judgement;Awareness                          Safety/Judgement: Decreased awareness of deficits;Decreased awareness of safety Awareness: Emergent Problem Solving: Slow processing;Requires verbal cues;Requires tactile cues;Difficulty sequencing General Comments: pt has decreased understanding of need for energy conservation, tries to do to much for herself,      Exercises Other Exercises Other Exercises: reducing RR    General Comments General comments (skin integrity, edema, etc.): 20 L HHFNC 90%FiO2, SaO2 88-92%O2, HR 110-120bpm      Pertinent Vitals/Pain Pain Assessment: No/denies pain           PT Goals (current goals can now be found in the care plan section) Acute Rehab PT Goals Patient Stated Goal: to understand what is going on PT Goal Formulation: With patient Time For Goal Achievement: 10/01/20 Potential to Achieve Goals: Fair Progress towards PT goals: Not progressing toward goals - comment (need to work on energy conservation with ADLs)    Frequency    Min 2X/week      PT Plan Current plan remains appropriate       AM-PAC PT "6 Clicks" Mobility   Outcome Measure  Help needed turning from your back to your side while in a flat bed without using bedrails?: A Little Help needed moving from lying on your back to sitting on the side of a flat bed without using bedrails?: A Little Help needed moving to and from  a bed to a chair (including a wheelchair)?: A Little Help needed standing up from a chair using your arms (e.g., wheelchair or bedside chair)?: A Little Help needed to walk in hospital room?: A Lot Help needed climbing 3-5 steps with a railing? : A Lot 6 Click Score: 16    End of Session Equipment Utilized During Treatment: Oxygen Activity Tolerance: Patient limited by fatigue;Other (comment) (increased O2 demand) Patient left: with call bell/phone within reach;in bed;with bed alarm set Nurse Communication: Mobility status PT Visit Diagnosis: Other abnormalities of gait and  mobility (R26.89);Muscle weakness (generalized) (M62.81)     Time: 7414-2395 PT Time Calculation (min) (ACUTE ONLY): 24 min  Charges:  $Self Care/Home Management: 8-22                     Trejon Duford B. Migdalia Dk PT, DPT Acute Rehabilitation Services Pager [phone] Office [phone]    [place] 09/17/2020, 4:20 PM

## 2020-09-17 NOTE — Progress Notes (Signed)
Patients MEWS turned to Red at 0925 with a respiration of 32. Patient was having a bowel movement at the time and was experiencing dyspnea with oxygen saturation at 91% on 20L HFNC. Patients MEWS returned to yellow at 0945. Jarrett Soho, Camera operator, notified.  Daymon Larsen, RN

## 2020-09-18 DIAGNOSIS — N179 Acute kidney failure, unspecified: Secondary | ICD-10-CM | POA: Diagnosis not present

## 2020-09-18 LAB — GLUCOSE, CAPILLARY
Glucose-Capillary: 91 mg/dL (ref 70–99)
Glucose-Capillary: 226 mg/dL — ABNORMAL HIGH (ref 70–99)
Glucose-Capillary: 233 mg/dL — ABNORMAL HIGH (ref 70–99)
Glucose-Capillary: 254 mg/dL — ABNORMAL HIGH (ref 70–99)

## 2020-09-18 LAB — CBC
HCT: 29.2 % — ABNORMAL LOW (ref 36.0–46.0)
Hemoglobin: 9.4 g/dL — ABNORMAL LOW (ref 12.0–15.0)
MCH: 27.3 pg (ref 26.0–34.0)
MCHC: 32.2 g/dL (ref 30.0–36.0)
MCV: 84.9 fL (ref 80.0–100.0)
Platelets: 125 10*3/uL — ABNORMAL LOW (ref 150–400)
RBC: 3.44 MIL/uL — ABNORMAL LOW (ref 3.87–5.11)
RDW: 21.4 % — ABNORMAL HIGH (ref 11.5–15.5)
WBC: 3.2 10*3/uL — ABNORMAL LOW (ref 4.0–10.5)
nRBC: 0 % (ref 0.0–0.2)

## 2020-09-18 LAB — HEPATIC FUNCTION PANEL
ALT: 33 U/L (ref 0–44)
AST: 15 U/L (ref 15–41)
Albumin: 3 g/dL — ABNORMAL LOW (ref 3.5–5.0)
Alkaline Phosphatase: 128 U/L — ABNORMAL HIGH (ref 38–126)
Bilirubin, Direct: 0.1 mg/dL (ref 0.0–0.2)
Indirect Bilirubin: 1.1 mg/dL — ABNORMAL HIGH (ref 0.3–0.9)
Total Bilirubin: 1.2 mg/dL (ref 0.3–1.2)
Total Protein: 6 g/dL — ABNORMAL LOW (ref 6.5–8.1)

## 2020-09-18 LAB — RENAL FUNCTION PANEL
Albumin: 3 g/dL — ABNORMAL LOW (ref 3.5–5.0)
Anion gap: 8 (ref 5–15)
BUN: 21 mg/dL (ref 8–23)
CO2: 22 mmol/L (ref 22–32)
Calcium: 9 mg/dL (ref 8.9–10.3)
Chloride: 109 mmol/L (ref 98–111)
Creatinine, Ser: 1.66 mg/dL — ABNORMAL HIGH (ref 0.44–1.00)
GFR, Estimated: 34 mL/min — ABNORMAL LOW (ref >60)
Glucose, Bld: 56 mg/dL — ABNORMAL LOW (ref 70–99)
Phosphorus: 1.8 mg/dL — ABNORMAL LOW (ref 2.5–4.6)
Potassium: 4.2 mmol/L (ref 3.5–5.1)
Sodium: 139 mmol/L (ref 135–145)

## 2020-09-18 NOTE — Progress Notes (Signed)
Report called To [name] on [place]. Patient is aware of transfer and will transfer her with all belongings monitor and O2.in bed.

## 2020-09-18 NOTE — Progress Notes (Signed)
PROGRESS NOTE    [name] [name]  DQQ:[id number] DOB: January 30, 1956 DOA: 08/18/2020 PCP: Glendale Chard, MD   Chief Complain: Chest pain, weakness, Covid positive  Brief Narrative: Patient is a 70 old female with history of renal transplant x2 on chronic immunosuppression agents, diabetes, hypothyroidism who presented with shortness of breath initially.  She was found to be in acute hypoxic respiratory failure due to Covid pneumonia.  Hospital course was prolonged and was complicated by slow improvement from severe hypoxia, AKI and non-STEMI.  Continues to require high flow oxygen.  Currently she is DNR.  If she worsens, plan is for comfort care.  Assessment & Plan:   Principal Problem:   ARF (acute renal failure) (HCC) Active Problems:   AKI (acute kidney injury) ([place])   Renal transplant recipient   Immunocompromised ([place])   DM (diabetes mellitus), type 2 with renal complications ([place])   Essential hypertension with goal blood pressure less than 140/90   Acute hypoxemic respiratory failure due to COVID-19 ([place])   Respiratory failure (HCC)   Acute hyperkalemia   Diabetic ketoacidosis without coma associated with type 1 diabetes mellitus ([place])   Pressure injury of skin   Acute hypoxic respiratory failure due to Covid pneumonia: Fully vaccinated patient.  Immunocompromised status due to renal transplant, immunosuppressive medications.  Initially presented with chest pain, shortness of breath.  Found to have multifocal pneumonia from Covid.  Completed treatment with IV steroids, baricitinib, remdesivir.  Also was treated with IV antibiotics for superimposed bacterial pneumonia.  Despite aggressive measures, she remains severely hypoxic.  Several discussion was held with family/patient about goals of care.  Currently DNR.  If declines or worsens, possible consideration of comfort care measures(family aware and agreeable).  Continue pulmonary toileting, incentive spirometer.  Mobilization  encouraged. Off isolation Today she is down to 20-25 L of oxygen per minute.  Sinus pause:Happened on  1/20 with 4.5 seconds pause.  Beta-blocker discontinued.  Currently stable.  NSTEMI: Patient with chest pain on 1/23 with EKG changes.  Treated with  Nitroglycerin, morphine .  Echo is reassuring with preserved ejection fraction.  On aspirin/statin, completed 48 hours of IV heparin.  Cardiology signed off.  AKI on CKD stage IV/transplant kidney: Transplant x2.  AKI was most likely hemodynamically mediated.  Creatinine has remained acutely around 1.7.  Nephrology was following.  Immunosuppression has been resumed: On Myfortic, Prograf, prednisone.  Nephrology has signed off.  Hyperkalemia: Resolved  Transaminitis: LFTs trending down.  Right upper quadrant ultrasound is nonacute and showed some sludge/gallstones but no signs of cholecystitis.  Hepatitis panel negative.  Patient is asymptomatic.  Continue to monitor intermittently.  Chronic normocytic anemia: Most likely associated  with CKD.  No signs of bleeding.  Status post 1 unit of PRBC transfusion on 09/11/2020.  Continue monitoring hemoglobin intermittently  Thrombocytopenia: Most likely associated with acute illness.  Continue to monitor,stable  Complicated UTI: Completed course of Rocephin.  Hypothyroidism: Continue Synthyroid.  Repeat TFT in 3 months  Hypertension: Currently blood pressure stable.  Continue medications  Hyperlipidemia: Statin held due to elevated LFTs.  Currently stable  Obesity: BMI 33.9  DKA: Resolved.  No longer on insulin drip.  Continue current insulin regimen.  Hemoglobin A1c of 8.2 as per 1/11.  Monitor blood sugars, mainly while on prednisone.  Deconditioning/debility: PT/OT recommended  skilled nursing facility on discharge.  Pressure Injury 08/20/20 Coccyx Mid Stage 1 -  Intact skin with non-blanchable redness of a localized area usually over a bony prominence. (Active)  08/20/20 0800  Location:  Coccyx  Location Orientation: Mid  Staging: Stage 1 -  Intact skin with non-blanchable redness of a localized area usually over a bony prominence.  Wound Description (Comments):   Present on Admission: Yes             DVT prophylaxis:Lovenox Code Status: DNR Family Communication: daughter on phone on 09/16/20 Status is: Inpatient  Remains inpatient appropriate because:Inpatient level of care appropriate due to severity of illness   Dispo: The patient is from: Home              Anticipated d/c is to: Skilled nursing facility              Anticipated d/c date is: > 3 days              Patient currently is not medically stable to d/c.   Difficult to place patient No    Consultants: None  Procedures:  Antimicrobials:  Anti-infectives (From admission, onward)   Start     Dose/Rate Route Frequency Ordered Stop   09/07/20 1030  vancomycin (VANCOCIN) IVPB 1000 mg/200 mL premix  Status:  Discontinued        1,000 mg 200 mL/hr over 60 Minutes Intravenous Every 48 hours 09/05/20 0933 09/07/20 0951   09/05/20 1030  vancomycin (VANCOREADY) IVPB 2000 mg/400 mL        2,000 mg 200 mL/hr over 120 Minutes Intravenous  Once 09/05/20 0933 09/05/20 2018   09/05/20 1030  ceFEPIme (MAXIPIME) 2 g in sodium chloride 0.9 % 100 mL IVPB  Status:  Discontinued        2 g 200 mL/hr over 30 Minutes Intravenous Every 24 hours 09/05/20 0933 09/07/20 0951   08/20/20 1445  cefTRIAXone (ROCEPHIN) 1 g in sodium chloride 0.9 % 100 mL IVPB        1 g 200 mL/hr over 30 Minutes Intravenous Every 24 hours 08/20/20 1348 08/26/20 2147   08/20/20 1000  remdesivir 100 mg in sodium chloride 0.9 % 100 mL IVPB       "Followed by" Linked Group Details   100 mg 200 mL/hr over 30 Minutes Intravenous Daily 08/19/20 0021 08/23/20 1113   08/19/20 0130  remdesivir 200 mg in sodium chloride 0.9% 250 mL IVPB       "Followed by" Linked Group Details   200 mg 580 mL/hr over 30 Minutes Intravenous Once 08/19/20 0021  08/19/20 0314      Subjective:  Patient seen and examined the bedside this morning.  Hemodynamically stable during my evaluation, in mild sinus tachycardia as she was having cough.  Overall she looks comfortable, she did not complain of any worsening shortness of breath.  She was working with respiratory therapist.  Denies any new complaints.  Objective: Vitals:   09/17/20 2011 09/17/20 2310 09/18/20 0209 09/18/20 0439  BP: 123/60 128/73  124/71  Pulse: (!) 108 100 95 87  Resp: (!) 24 (!) 35 (!) 32 20  Temp:  98.8 F (37.1 C)  98.9 F (37.2 C)  TempSrc:  Oral  Oral  SpO2: 95% 93% 92% 94%  Weight:    85.1 kg  Height:        Intake/Output Summary (Last 24 hours) at 09/18/2020 0808 Last data filed at 09/18/2020 0544 Gross per 24 hour  Intake 60 ml  Output 700 ml  Net -640 ml   Filed Weights   09/16/20 0637 09/17/20 0345 09/18/20 0439  Weight: 82.9 kg 82.9 kg 85.1  kg    Examination:  General exam: Deconditioned, debilitated, chronically looking, sweet lady Respiratory system: Bilateral diminished air sounds, scattered rhonci cardiovascular system: Sinus tachycardia. No JVD, murmurs, rubs, gallops or clicks. Gastrointestinal system: Abdomen is nondistended, soft and nontender. No organomegaly or masses felt. Normal bowel sounds heard. Central nervous system: Alert and oriented. No focal neurological deficits. Extremities: No edema, no clubbing ,no cyanosis Skin: No rashes, lesions or ulcers,no icterus ,no pallor   Data Reviewed: I have personally reviewed following labs and imaging studies  CBC: Recent Labs  Lab 09/12/20 0113 09/13/20 0140 09/15/20 0028 09/18/20 0050  WBC 8.3 7.1 3.9* 3.2*  NEUTROABS 7.7  --   --   --   HGB 8.6* 8.8* 9.1* 9.4*  HCT 26.0* 26.4* 28.2* 29.2*  MCV 81.8 82.0 83.9 84.9  PLT 91* 92* 93* 448*   Basic Metabolic Panel: Recent Labs  Lab 09/14/20 1009 09/15/20 0028 09/16/20 0203 09/17/20 0031 09/18/20 0050  NA 138 137 137 138 139  K  3.7 3.7 3.5 3.8 4.2  CL 106 105 107 108 109  CO2 23 23 22 22 22   GLUCOSE 142* 187* 168* 140* 56*  BUN 29* 29* 28* 24* 21  CREATININE 1.74* 1.77* 1.86* 1.75* 1.66*  CALCIUM 9.5 9.0 8.8* 9.0 9.0  PHOS 1.9* 2.4* 2.3* 2.0* 1.8*   GFR: Estimated Creatinine Clearance: 34.2 mL/min (A) (by C-G formula based on SCr of 1.66 mg/dL (H)). Liver Function Tests: Recent Labs  Lab 09/12/20 0113 09/13/20 0140 09/14/20 1009 09/15/20 0028 09/16/20 0203 09/17/20 0031 09/18/20 0050  AST 27  --   --  19  --   --  15  ALT 116*  --   --  59*  --   --  33  ALKPHOS 203*  --   --  164*  --   --  128*  BILITOT 1.4*  --   --  1.3*  --   --  1.2  PROT 5.7*  --   --  5.4*  --   --  6.0*  ALBUMIN 3.0*   < > 3.2* 2.9*  2.9* 2.8* 2.9* 3.0*  3.0*   < > = values in this interval not displayed.   No results for input(s): LIPASE, AMYLASE in the last 168 hours. No results for input(s): AMMONIA in the last 168 hours. Coagulation Profile: Recent Labs  Lab 09/12/20 0113 09/13/20 0140  INR 1.1 1.1   Cardiac Enzymes: Recent Labs  Lab 09/15/20 0028  CKTOTAL 37*   BNP (last 3 results) No results for input(s): PROBNP in the last 8760 hours. HbA1C: No results for input(s): HGBA1C in the last 72 hours. CBG: Recent Labs  Lab 09/17/20 0616 09/17/20 1158 09/17/20 1627 09/17/20 2113 09/18/20 0638  GLUCAP 115* 188* 397* 126* 91   Lipid Profile: No results for input(s): CHOL, HDL, LDLCALC, TRIG, CHOLHDL, LDLDIRECT in the last 72 hours. Thyroid Function Tests: No results for input(s): TSH, T4TOTAL, FREET4, T3FREE, THYROIDAB in the last 72 hours. Anemia Panel: No results for input(s): VITAMINB12, FOLATE, FERRITIN, TIBC, IRON, RETICCTPCT in the last 72 hours. Sepsis Labs: Recent Labs  Lab 09/12/20 0113  PROCALCITON 0.10    No results found for this or any previous visit (from the past 240 hour(s)).       Radiology Studies: No results found.      Scheduled Meds: . amLODipine  10 mg Oral  Daily  . vitamin C  500 mg Oral Daily  . aspirin EC  81 mg Oral Daily  . darbepoetin (ARANESP) injection - NON-DIALYSIS  150 mcg Subcutaneous Q Tue-1800  . enoxaparin (LOVENOX) injection  40 mg Subcutaneous Q24H  . feeding supplement (NEPRO CARB STEADY)  237 mL Oral BID BM  . fluticasone  1 spray Each Nare Daily  . folic acid  1 mg Oral Daily  . hydrALAZINE  25 mg Oral Q8H  . insulin aspart  0-15 Units Subcutaneous TID WC  . insulin detemir  8 Units Subcutaneous BID  . isosorbide mononitrate  60 mg Oral Daily  . levothyroxine  50 mcg Oral Q0600  . LORazepam  1 mg Oral BID  . mouth rinse  15 mL Mouth Rinse BID  . mycophenolate  360 mg Oral BID  . pantoprazole  40 mg Oral BID  . predniSONE  5 mg Oral Q breakfast  . tacrolimus  3 mg Oral QPM  . tacrolimus  4 mg Oral Daily  . zinc sulfate  220 mg Oral Daily   Continuous Infusions: . sodium chloride    . sodium chloride Stopped (08/31/20 1545)     LOS: 30 days    Time spent: 25 mins.More than 50% of that time was spent in counseling and/or coordination of care.      Shelly Coss, MD [facility] P2/05/2021, 8:08 AM

## 2020-09-18 NOTE — Progress Notes (Signed)
Occupational Therapy Treatment Patient Details Name: [name] [name] MRN: [id number] DOB: 1955/10/08 Today's Date: 09/18/2020    History of present illness Pt is a 65 y.o. female who tested (+) COVID-19 on 08/13/20, now admitted 08/18/20 with worsening SOB, cough, chest pain and body aches. Workup for acute hypoxic respiratory failure due to COVID-19 PNA, DKA, AKI on CKD, UTI. PMH includes HTN, DM2, chronic steroid use, s/p renal transplant x2.Episode of CP 1/22; per Cardiology: troponin, EKG and history felt concerning for [place] / possibly NSTEMI - difficult to know in context of multiple medical stressors   OT comments  Patient continues to try and work with rehab despite [place].  Patient continues to use non-rebreather with transfers.  No real change in her functional status due to the deficits listed below, but OT wlll continue to follow in the acute setting to maximize her functional level prior to a transition to SNF for post acute rehab.    Follow Up Recommendations  SNF;Supervision/Assistance - 24 hour    Equipment Recommendations  3 in 1 bedside commode    Recommendations for Other Services      Precautions / Restrictions Precautions Precautions: Fall Precaution Comments: Watch SpO2 - quick to desaturate on HHFNC Restrictions Weight Bearing Restrictions: No       Mobility Bed Mobility Overal bed mobility: Needs Assistance Bed Mobility: Supine to Sit     Supine to sit: Min assist        Transfers Overall transfer level: Needs assistance   Transfers: Sit to/from Stand;Stand Pivot Transfers Sit to Stand: Min assist Stand pivot transfers: Min assist            Balance           Standing balance support: Bilateral upper extremity supported Standing balance-Leahy Scale: Poor Standing balance comment: requires single UE support once in standing                           ADL either performed or assessed with clinical judgement   ADL    Eating/Feeding: Sitting;Set up   Grooming: Set up;Sitting;Wash/dry hands;Wash/dry face                               Functional mobility during ADLs: Minimal assistance;Rolling walker       Vision       Perception     Praxis      Cognition Arousal/Alertness: Awake/alert Behavior During Therapy: WFL for tasks assessed/performed Overall Cognitive Status: Within Functional Limits for tasks assessed                                                            Pertinent Vitals/ Pain       Pain Assessment: No/denies pain Pain Intervention(s): Monitored during session                                                          Frequency  Min 2X/week        Progress Toward Goals  OT Goals(current goals  can now be found in the care plan section)  Progress towards OT goals: Progressing toward goals  Acute Rehab OT Goals Patient Stated Goal: I want to change my code status OT Goal Formulation: With patient Time For Goal Achievement: 09/25/20 Potential to Achieve Goals: Good  Plan Discharge plan remains appropriate    Co-evaluation                 AM-PAC OT "6 Clicks" Daily Activity     Outcome Measure   Help from another person eating meals?: None Help from another person taking care of personal grooming?: A Little Help from another person toileting, which includes using toliet, bedpan, or urinal?: A Lot Help from another person bathing (including washing, rinsing, drying)?: A Lot Help from another person to put on and taking off regular upper body clothing?: A Lot Help from another person to put on and taking off regular lower body clothing?: A Lot 6 Click Score: 15    End of Session Equipment Utilized During Treatment: Rolling walker;Oxygen  OT Visit Diagnosis: Unsteadiness on feet (R26.81)   Activity Tolerance Patient limited by fatigue   Patient Left with call bell/phone within reach;in  chair   Nurse Communication Mobility status        Time: 1142-1200 OT Time Calculation (min): 18 min  Charges: OT General Charges $OT Visit: 1 Visit OT Treatments $Self Care/Home Management : 8-22 mins  09/18/2020  Rich, OTR/L  Acute Rehabilitation Services  Office:  [phone]    Metta Clines 09/18/2020, 1:16 PM

## 2020-09-18 NOTE — Progress Notes (Addendum)
Transferred patient to [place] with [name] [name]. with Lonna Duval. Daughter Mendel Ryder made aware Phone Number [phone]

## 2020-09-18 NOTE — Progress Notes (Addendum)
Pt then assisted to stand breifly using RW in order place pillow under her while sitting recliner.  SPO2 dropped to 85.  Recovered to 91 after 1 min sitting.  Pt set up with lunch tray. Reinforced education around IS and flutter. Pt expressed desire to change code status, MD made aware.  Will cont plan of care.

## 2020-09-18 NOTE — Progress Notes (Signed)
Pt transported to 2W02 on HFNC w/ NRB mask.  No desaturations during transport.  Placed back on 30L@75 % once in new room.

## 2020-09-18 NOTE — Progress Notes (Signed)
Heated hi-flo machine spewing water out nasal cannula.  RT called twice to fix.  Appears operating appropriately at this time. Will monitor closely.

## 2020-09-19 DIAGNOSIS — U071 COVID-19: Secondary | ICD-10-CM | POA: Diagnosis not present

## 2020-09-19 DIAGNOSIS — E1121 Type 2 diabetes mellitus with diabetic nephropathy: Secondary | ICD-10-CM

## 2020-09-19 DIAGNOSIS — J9621 Acute and chronic respiratory failure with hypoxia: Secondary | ICD-10-CM | POA: Diagnosis not present

## 2020-09-19 DIAGNOSIS — D849 Immunodeficiency, unspecified: Secondary | ICD-10-CM | POA: Diagnosis not present

## 2020-09-19 LAB — GLUCOSE, CAPILLARY
Glucose-Capillary: 114 mg/dL — ABNORMAL HIGH (ref 70–99)
Glucose-Capillary: 215 mg/dL — ABNORMAL HIGH (ref 70–99)
Glucose-Capillary: 262 mg/dL — ABNORMAL HIGH (ref 70–99)
Glucose-Capillary: 273 mg/dL — ABNORMAL HIGH (ref 70–99)

## 2020-09-19 LAB — RENAL FUNCTION PANEL
Albumin: 2.9 g/dL — ABNORMAL LOW (ref 3.5–5.0)
Anion gap: 8 (ref 5–15)
BUN: 18 mg/dL (ref 8–23)
CO2: 21 mmol/L — ABNORMAL LOW (ref 22–32)
Calcium: 9.3 mg/dL (ref 8.9–10.3)
Chloride: 109 mmol/L (ref 98–111)
Creatinine, Ser: 1.87 mg/dL — ABNORMAL HIGH (ref 0.44–1.00)
GFR, Estimated: 29 mL/min — ABNORMAL LOW (ref >60)
Glucose, Bld: 140 mg/dL — ABNORMAL HIGH (ref 70–99)
Phosphorus: 1.8 mg/dL — ABNORMAL LOW (ref 2.5–4.6)
Potassium: 4 mmol/L (ref 3.5–5.1)
Sodium: 138 mmol/L (ref 135–145)

## 2020-09-19 MED ORDER — BARRIER CREAM NON-SPECIFIED
1.0000 "application " | TOPICAL_CREAM | Freq: Two times a day (BID) | TOPICAL | Status: DC | PRN
  Filled 2020-09-19: qty 1

## 2020-09-19 MED ORDER — OXYCODONE HCL 5 MG PO TABS
2.5000 mg | ORAL_TABLET | ORAL | Status: DC | PRN
  Administered 2020-09-19 (×2): 2.5 mg via ORAL
  Filled 2020-09-19 (×2): qty 1

## 2020-09-19 MED ORDER — HYDROMORPHONE HCL 1 MG/ML IJ SOLN
0.5000 mg | INTRAMUSCULAR | Status: DC | PRN
  Administered 2020-09-22 – 2020-09-28 (×2): 0.5 mg via INTRAVENOUS
  Filled 2020-09-19 (×2): qty 1

## 2020-09-19 NOTE — Progress Notes (Signed)
Inpatient Diabetes Program Recommendations  AACE/ADA: New Consensus Statement on Inpatient Glycemic Control (2015)  Target Ranges:  Prepandial:   less than 140 mg/dL      Peak postprandial:   less than 180 mg/dL (1-2 hours)      Critically ill patients:  140 - 180 mg/dL   Lab Results  Component Value Date   GLUCAP 114 (H) 09/19/2020   HGBA1C 8.2 (H) 08/19/2020    Review of Glycemic Control Results for [name] [name], [name] [name] (MRN [id number]) as of 09/19/2020 11:07  Ref. Range 09/18/2020 06:38 09/18/2020 11:19 09/18/2020 16:33 09/18/2020 21:07 09/19/2020 07:33  Glucose-Capillary Latest Ref Range: 70 - 99 mg/dL 91 226 (H) 233 (H) 254 (H) 114 (H)   Diabetes history: DM 2 Outpatient Diabetes medications: Levemir 30 untis qam, 35 units qpm, Novolog 4 units tid meal coverage Current orders for Inpatient glycemic control: Levemir 8 units bid Novolog 0-15 units tid  Nepro bid between meals  Inpatient Diabetes Program Recommendations:    - Add Novolog 3 units tid meal coverage with parameters if pt eats at least 50% of meal.  Thanks,  Tama Headings RN, MSN, BC-ADM Inpatient Diabetes Coordinator Team Pager [phone] (8a-5p)

## 2020-09-19 NOTE — Progress Notes (Addendum)
[facility] PROGRESS NOTE  [name] [name] LGX:[id number] DOB: 1955-12-08 DOA: 08/18/2020 PCP: Glendale Chard, MD  Status: Remains inpatient appropriate because:Unsafe d/c plan, IV treatments appropriate due to intensity of illness or inability to take PO and Inpatient level of care appropriate due to severity of illness   Dispo:  Patient From: Home  Planned Disposition: [place]  Expected discharge date: 09/20/2020  Medically stable for discharge: No  Difficult to place: Yes; Barriers to DC: Continues to require very high levels of oxygen secondary to Covid lung injury.  She is physically deconditioned and requires SNF for rehabilitative therapies but given high level of oxygen requirements not a candidate for SNF at this time.   Level of care: Progressive  Code Status: DNR changed to full code on 2/10 Family Communication: Patient only DVT prophylaxis: Lovenox Vaccination status: Fully vaccinated against Covid prior to admission.  Initial vaccine November 2021 with follow-up vaccines March and April 2021  Foley catheter: No  HPI: 35 old female with history of renal transplant x2 on chronic immunosuppression agents, diabetes, hypothyroidism who presented with shortness of breath initially.  She was found to be in acute hypoxic respiratory failure due to Covid pneumonia.  Hospital course was prolonged and was complicated by slow improvement from severe hypoxia, AKI and non-STEMI.  Continues to require high flow oxygen.  Patient was a DNR until 2/10 at which point she changed back to full code.   Subjective: Awake, sitting on side of bed with significant increased work of breathing (with PT) O2 sats remained in the mid 70s despite stopping activity and resting.  Patient subsequently put back to bed.  Discussed with patient rationale for stopping DNR.  Having difficulty verbally responding so I told patient she should think about resuming DNR given her poor  debilitated state, noting that if she were to decompensate to the point of requiring resuscitation that she would likely not survive and that this would actually cause more pain and discomfort.  Reinforced to patient that DNR does not mean do not treat and we will continue to seek out all possible options to improve her pulmonary status.  As patient began to think on what was discussed and (after her respiratory status improved by being placed back in the bed with head of bed up and 100% NRB mask placed) she replied "this is so unfair" in response to our discussing her severe COVID lung injury.  Emotional support was given.  Objective: Vitals:   09/19/20 0630 09/19/20 0754  BP: (!) 146/73   Pulse: (!) 108 (!) 113  Resp: 18 18  Temp: 98.6 F (37 C)   SpO2: 91% 94%   No intake or output data in the 24 hours ending 09/19/20 0800 Filed Weights   09/17/20 0345 09/18/20 0439 09/19/20 0442  Weight: 82.9 kg 85.1 kg 85.1 kg    Exam:  Constitutional: Moderate respiratory distress as evidenced by increased work of breathing, initially was calm but became emotional when discussing Covid lung injury severity and need to reconsider CODE STATUS. Respiratory: Coarse to auscultation with increased work of breathing with any type of activity.  Improved after placed back in bed with head of bed elevated 35 to 40 degrees.  Currently has [place] in place with 40 L / 70% FiO2-utilizes 100% NRB mask during recovery after activity.  Patient also with rhinorrhea secondary to nasal cannula. Cardiovascular: Pulse is regular with underlying sinus tachycardia, blood pressure stable.  No peripheral edema. Abdomen: no tenderness, no  masses palpated. Bowel sounds positive.  Variable oral intake. LBM 2/10 Neurologic: CN 2-12 grossly intact. Sensation intact, DTR normal. Strength 3-4/5 x all 4 extremities.  Psychiatric: Normal judgment and insight. Alert and oriented x 3.  Sad mood.   Assessment/Plan: Acute  problems: Acute on chronic hypoxic respiratory failure due to Covid pneumonia:  Fully vaccinated patient.  Immunocompromised status due to renal transplant, immunosuppressive medications.   Initially presented with chest pain, shortness of breath.  Found to have multifocal pneumonia from Covid.   Completed treatment with IV steroids, baricitinib, remdesivir.  Also was treated with IV antibiotics for superimposed bacterial pneumonia.   Despite aggressive measures, she remains severely hypoxic.  Several discussion was held with family/patient about goals of care.  Patient reverse DNR status on 2/10.  CODE STATUS was discussed again today Continue pulmonary toileting, incentive spirometer.   Significant difficulty mobilizing given any amount of activity precipitate severe increased work of breathing and O2 sats decreased into the 70s Continue scheduled Ativan every 12 hours-may need to increase frequency 2/11 have initiated IV Dilaudid 0.5 mg every 4 hours prn for air hunger Off isolation  AKI on CKD stage IV/transplant kidney: T ransplant x2.  AKI was most likely hemodynamically mediated.   Creatinine has remained acutely around 1.7.   Nephrology has signed off  Continue Myfortic, Prograf, prednisone.  Avoid nephrotoxic medication Previous hyperkalemia resolved  DKA/diabetes mellitus on insulin with hyperglycemia DKA resolved.  No longer on insulin drip.   Continue current insulin regimen.   Hemoglobin A1c of 8.2 as per 1/11.   Likely secondary to chronic prednisone requirement Follow CBG and provide SSI  Sinus pause: Happened on  1/20 with 4.5 seconds pause.   Beta-blocker discontinued.  Currently stable.  NSTEMI:  Patient with chest pain on 1/23 with EKG changes.   Treated with Nitroglycerin, morphine and beta-blocker.   Echo is reassuring with preserved ejection fraction.   Continue aspirin/statin completed 48 hours of IV heparin.   Cardiology signed off.  Chronic normocytic  anemia:  Most likely associated  with CKD.  No signs of bleeding.   Status post 1 unit of PRBC transfusion on 09/11/2020.   Continue monitoring hemoglobin intermittently  Deconditioning/debility: PT/OT recommended  skilled nursing facility on discharge.  Stage I decubitus coccyx Pressure Injury 08/20/20 Coccyx Mid Stage 1 -  Intact skin with non-blanchable redness of a localized area usually over a bony prominence. (Active)  Date First Assessed/Time First Assessed: 08/20/20 0800   Location: Coccyx  Location Orientation: Mid  Staging: Stage 1 -  Intact skin with non-blanchable redness of a localized area usually over a bony prominence.  Present on Admission: Yes    Assessments 08/20/2020  9:00 AM 09/18/2020  7:40 PM  Dressing Type Foam - Lift dressing to assess site every shift None  Dressing Changed -  Dressing Change Frequency Every 3 days -  State of Healing - Other (Comment)  Site / Wound Assessment Clean;Dry;Pink Clean  Peri-wound Assessment - Intact  Wound Length (cm) 5 cm -  Wound Width (cm) 0.1 cm -  Wound Depth (cm) 0 cm -  Wound Surface Area (cm^2) 0.5 cm^2 -  Wound Volume (cm^3) 0 cm^3 -  Drainage Amount None None  Treatment Cleansed;Off loading -     No Linked orders to display     Wound / Incision (Open or Dehisced) 08/31/20 Puncture Abdomen Left;Lower From Heparin SQ injections. (Active)  Date First Assessed/Time First Assessed: 08/31/20 2000   Wound Type: Puncture  Location: Abdomen  Location Orientation: Left;Lower  Wound Description (Comments): From Heparin SQ injections.  Present on Admission: No    Assessments 08/31/2020  8:00 PM 09/18/2020  7:40 PM  Dressing Type - None  Site / Wound Assessment - Clean;Dry;Other (Comment);Purple  Peri-wound Assessment - Purple  Wound Length (cm) 0 cm -  Wound Width (cm) 0 cm -  Wound Depth (cm) 0 cm -  Wound Volume (cm^3) 0 cm^3 -  Wound Surface Area (cm^2) 0 cm^2 -  Drainage Amount - None     No Linked orders to display        Other problems: Transaminitis:  Resolved likely secondary to COVID meds as well as hypoperfusion  Right upper quadrant ultrasound is nonacute and showed some sludge/gallstones but no signs of cholecystitis.   Hepatitis panel negative.    Thrombocytopenia:  Most likely associated with acute illness.  Continue to monitor,stable  Complicated UTI:  Completed course of Rocephin.  Hypothyroidism:  Continue Synthyroid.   Repeat TFT in 3 months  Hypertension:  Currently blood pressure stable.  Continue medications  Hyperlipidemia:  Statin held due to elevated LFTs.   LFTs have normalized so can consider resumption of statin although oral intake remains variable  Obesity:  BMI 33.9 `Nutrition Status:     Data Reviewed: Basic Metabolic Panel: Recent Labs  Lab 09/15/20 0028 09/16/20 0203 09/17/20 0031 09/18/20 0050 09/19/20 0229  NA 137 137 138 139 138  K 3.7 3.5 3.8 4.2 4.0  CL 105 107 108 109 109  CO2 23 22 22 22  21*  GLUCOSE 187* 168* 140* 56* 140*  BUN 29* 28* 24* 21 18  CREATININE 1.77* 1.86* 1.75* 1.66* 1.87*  CALCIUM 9.0 8.8* 9.0 9.0 9.3  PHOS 2.4* 2.3* 2.0* 1.8* 1.8*   Liver Function Tests: Recent Labs  Lab 09/15/20 0028 09/16/20 0203 09/17/20 0031 09/18/20 0050 09/19/20 0229  AST 19  --   --  15  --   ALT 59*  --   --  33  --   ALKPHOS 164*  --   --  128*  --   BILITOT 1.3*  --   --  1.2  --   PROT 5.4*  --   --  6.0*  --   ALBUMIN 2.9*  2.9* 2.8* 2.9* 3.0*  3.0* 2.9*   No results for input(s): LIPASE, AMYLASE in the last 168 hours. No results for input(s): AMMONIA in the last 168 hours. CBC: Recent Labs  Lab 09/13/20 0140 09/15/20 0028 09/18/20 0050  WBC 7.1 3.9* 3.2*  HGB 8.8* 9.1* 9.4*  HCT 26.4* 28.2* 29.2*  MCV 82.0 83.9 84.9  PLT 92* 93* 125*   Cardiac Enzymes: Recent Labs  Lab 09/15/20 0028  CKTOTAL 37*   BNP (last 3 results) Recent Labs    09/11/20 0155 09/12/20 0113 09/15/20 0028  BNP 86.8 80.1 80.0     ProBNP (last 3 results) No results for input(s): PROBNP in the last 8760 hours.  CBG: Recent Labs  Lab 09/18/20 0638 09/18/20 1119 09/18/20 1633 09/18/20 2107 09/19/20 0733  GLUCAP 91 226* 233* 254* 114*    No results found for this or any previous visit (from the past 240 hour(s)).   Studies: No results found.  Scheduled Meds: . amLODipine  10 mg Oral Daily  . vitamin C  500 mg Oral Daily  . aspirin EC  81 mg Oral Daily  . darbepoetin (ARANESP) injection - NON-DIALYSIS  150 mcg Subcutaneous Q Tue-1800  .  enoxaparin (LOVENOX) injection  40 mg Subcutaneous Q24H  . feeding supplement (NEPRO CARB STEADY)  237 mL Oral BID BM  . fluticasone  1 spray Each Nare Daily  . folic acid  1 mg Oral Daily  . hydrALAZINE  25 mg Oral Q8H  . insulin aspart  0-15 Units Subcutaneous TID WC  . insulin detemir  8 Units Subcutaneous BID  . isosorbide mononitrate  60 mg Oral Daily  . levothyroxine  50 mcg Oral Q0600  . LORazepam  1 mg Oral BID  . mouth rinse  15 mL Mouth Rinse BID  . mycophenolate  360 mg Oral BID  . pantoprazole  40 mg Oral BID  . predniSONE  5 mg Oral Q breakfast  . tacrolimus  3 mg Oral QPM  . tacrolimus  4 mg Oral Daily  . zinc sulfate  220 mg Oral Daily   Continuous Infusions: . sodium chloride    . sodium chloride Stopped (08/31/20 1545)    Principal Problem:   ARF (acute renal failure) (HCC) Active Problems:   AKI (acute kidney injury) ([place])   Renal transplant recipient   Immunocompromised ([place])   DM (diabetes mellitus), type 2 with renal complications ([place])   Essential hypertension with goal blood pressure less than 140/90   Acute hypoxemic respiratory failure due to COVID-19 ([place])   Respiratory failure (HCC)   Acute hyperkalemia   Diabetic ketoacidosis without coma associated with type 1 diabetes mellitus ([place])   Pressure injury of skin   Consultants:  None  Procedures:  Echocardiogram  Antibiotics: Anti-infectives (From admission,  onward)   Start     Dose/Rate Route Frequency Ordered Stop   09/07/20 1030  vancomycin (VANCOCIN) IVPB 1000 mg/200 mL premix  Status:  Discontinued        1,000 mg 200 mL/hr over 60 Minutes Intravenous Every 48 hours 09/05/20 0933 09/07/20 0951   09/05/20 1030  vancomycin (VANCOREADY) IVPB 2000 mg/400 mL        2,000 mg 200 mL/hr over 120 Minutes Intravenous  Once 09/05/20 0933 09/05/20 2018   09/05/20 1030  ceFEPIme (MAXIPIME) 2 g in sodium chloride 0.9 % 100 mL IVPB  Status:  Discontinued        2 g 200 mL/hr over 30 Minutes Intravenous Every 24 hours 09/05/20 0933 09/07/20 0951   08/20/20 1445  cefTRIAXone (ROCEPHIN) 1 g in sodium chloride 0.9 % 100 mL IVPB        1 g 200 mL/hr over 30 Minutes Intravenous Every 24 hours 08/20/20 1348 08/26/20 2147   08/20/20 1000  remdesivir 100 mg in sodium chloride 0.9 % 100 mL IVPB       "Followed by" Linked Group Details   100 mg 200 mL/hr over 30 Minutes Intravenous Daily 08/19/20 0021 08/23/20 1113   08/19/20 0130  remdesivir 200 mg in sodium chloride 0.9% 250 mL IVPB       "Followed by" Linked Group Details   200 mg 580 mL/hr over 30 Minutes Intravenous Once 08/19/20 0021 08/19/20 0314        Time spent: 30 minutes    Erin Hearing ANP  [facility] 7 am - 330 pm/M-F for direct patient care and secure chat Please refer to Amion for contact info 31  days

## 2020-09-19 NOTE — Progress Notes (Signed)
Physical Therapy Treatment Patient Details Name: [name] [name] MRN: [id number] DOB: Feb 15, 1956 Today's Date: 09/19/2020    History of Present Illness Pt is a 65 y.o. female who tested (+) COVID-19 on 08/13/20, now admitted 08/18/20 with worsening SOB, cough, chest pain and body aches. Acute hypoxic respiratory failure due to COVID-19 PNA, DKA, AKI on CKD, UTI. CP on 1/22 with NSTEMI . PMHx: HTN, DM2, chronic steroid use, s/p renal transplant x2.    PT Comments    Pt very pleasant and wanting to attempt mobility. Pt with sats 89% on HHFNC 40L, 70% on arrival at rest with drop to 80% with transition to EOB and unable to recover with sitting with pursed lip breathing, chest stretch and opening and calming. Requiring return to supine and additional face mask to recover. Once SpO2 92% pt able to perform limited supine bil LE HEP. Will continue to follow to attempt slow mobility progression/strengthening as pt able to tolerate.   Provided level 1 theraband per NP request for attempt with future sessions.   Follow Up Recommendations  SNF;Supervision/Assistance - 24 hour     Equipment Recommendations  Rolling walker with 5" wheels;3in1 (PT);Hospital bed    Recommendations for Other Services       Precautions / Restrictions Precautions Precautions: Fall Precaution Comments: Watch SpO2 - quick to desaturate on HHFNC, use additional face mask with any mobility    Mobility  Bed Mobility Overal bed mobility: Needs Assistance Bed Mobility: Supine to Sit;Sit to Supine     Supine to sit: Min guard Sit to supine: Mod assist   General bed mobility comments: HOB 30 degrees with use of rail and slow pace pt able to transition to sitting EOB without physical assist. Pt EOB 5 min with SpO2 maintaining 80% and unable to rebound despite cues, HHFNC and increased time, NP present during sitting and ultimately mod assist to return to supine due to lack of rebound. Face mask applied with return  to supine with rebound to 92%. Total +2 to slide fully to [place] transfer comment: unable due to respiratory function  Ambulation/Gait                 Stairs             Wheelchair Mobility    Modified Rankin (Stroke Patients Only)       Balance   Sitting-balance support: No upper extremity supported;Feet supported Sitting balance-Leahy Scale: Fair Sitting balance - Comments: EOB with and without UE support.                                    Cognition Arousal/Alertness: Awake/alert Behavior During Therapy: WFL for tasks assessed/performed Overall Cognitive Status: Impaired/Different from baseline                           Safety/Judgement: Decreased awareness of deficits     General Comments: decreased awareness of true medical complexity and deficits      Exercises General Exercises - Lower Extremity Heel Slides: AROM;Both;Supine;10 reps Hip ABduction/ADduction: AROM;Both;Supine;10 reps    General Comments        Pertinent Vitals/Pain Pain Assessment: No/denies pain    Home Living  Prior Function            PT Goals (current goals can now be found in the care plan section) Progress towards PT goals: Not progressing toward goals - comment (due to respiratory function)    Frequency    Min 2X/week      PT Plan Current plan remains appropriate    Co-evaluation              AM-PAC PT "6 Clicks" Mobility   Outcome Measure  Help needed turning from your back to your side while in a flat bed without using bedrails?: None Help needed moving from lying on your back to sitting on the side of a flat bed without using bedrails?: None Help needed moving to and from a bed to a chair (including a wheelchair)?: A Lot Help needed standing up from a chair using your arms (e.g., wheelchair or bedside chair)?: A Lot Help needed to walk in  hospital room?: Total Help needed climbing 3-5 steps with a railing? : Total 6 Click Score: 14    End of Session Equipment Utilized During Treatment: Oxygen Activity Tolerance: Patient limited by fatigue Patient left: in bed;with call bell/phone within reach;with nursing/sitter in room Nurse Communication: Mobility status PT Visit Diagnosis: Other abnormalities of gait and mobility (R26.89);Muscle weakness (generalized) (M62.81)     Time: 0938-1829 PT Time Calculation (min) (ACUTE ONLY): 26 min  Charges:  $Therapeutic Exercise: 8-22 mins $Therapeutic Activity: 8-22 mins                     Jaymian Bogart P, PT Acute Rehabilitation Services Pager: [phone] Office: [place] 09/19/2020, 11:56 AM

## 2020-09-19 NOTE — TOC Progression Note (Signed)
Transition of Care [facility]) - Progression Note    Patient Details  Name: Letishia Elliott MRN: [id number] Date of Birth: 01-Mar-1956  [facility] CM/SW Contact  Curlene Labrum, RN Phone Number: 09/19/2020, 2:04 PM  Clinical Narrative:    Case management met with the patient at the bedside regarding transitions of care.  The patient was working with PT at the bedside and had significant SOB during PT session with O2 sats dropping into 80's with HFNC at 40L present.  The patient was assisted back into the bed for PT session and nonrebreather at 100% placed back on her face with [place] and O2 sats increased accordingly to 88%.  The patient was tearful when she was speaking about her COVID infection after receiving prior immunizations for COVID.  CM and MSW will continue to follow the patient for transitions of care needs and SNF work up.   Expected Discharge Plan: [place] Barriers to Discharge: Continued Medical Work up  Expected Discharge Plan and Services Expected Discharge Plan: [place] In-house Referral: Clinical Social Work Discharge Planning Services: CM Consult Post Acute Care Choice: [place] arrangements for the past 2 months: Single Family Home                                       Social Determinants of Health (SDOH) Interventions    Readmission Risk Interventions Readmission Risk Prevention Plan 09/19/2020  Transportation Screening Complete  Medication Review [employer]) Complete  PCP or Specialist appointment within 3-5 days of discharge Complete  HRI or Home Care Consult Complete  SW Recovery Care/Counseling Consult Complete  Palliative Care Screening Complete  Skilled Nursing Facility Complete  Some recent data might be hidden

## 2020-09-20 DIAGNOSIS — N179 Acute kidney failure, unspecified: Secondary | ICD-10-CM | POA: Diagnosis not present

## 2020-09-20 LAB — RENAL FUNCTION PANEL
Albumin: 3.1 g/dL — ABNORMAL LOW (ref 3.5–5.0)
Anion gap: 10 (ref 5–15)
BUN: 20 mg/dL (ref 8–23)
CO2: 23 mmol/L (ref 22–32)
Calcium: 9.5 mg/dL (ref 8.9–10.3)
Chloride: 106 mmol/L (ref 98–111)
Creatinine, Ser: 2.09 mg/dL — ABNORMAL HIGH (ref 0.44–1.00)
GFR, Estimated: 26 mL/min — ABNORMAL LOW (ref >60)
Glucose, Bld: 144 mg/dL — ABNORMAL HIGH (ref 70–99)
Phosphorus: 2.4 mg/dL — ABNORMAL LOW (ref 2.5–4.6)
Potassium: 4.3 mmol/L (ref 3.5–5.1)
Sodium: 139 mmol/L (ref 135–145)

## 2020-09-20 LAB — GLUCOSE, CAPILLARY
Glucose-Capillary: 153 mg/dL — ABNORMAL HIGH (ref 70–99)
Glucose-Capillary: 157 mg/dL — ABNORMAL HIGH (ref 70–99)
Glucose-Capillary: 188 mg/dL — ABNORMAL HIGH (ref 70–99)
Glucose-Capillary: 239 mg/dL — ABNORMAL HIGH (ref 70–99)

## 2020-09-20 MED ORDER — ENOXAPARIN SODIUM 30 MG/0.3ML ~~LOC~~ SOLN
30.0000 mg | SUBCUTANEOUS | Status: DC
  Administered 2020-09-20 – 2020-10-11 (×22): 30 mg via SUBCUTANEOUS
  Filled 2020-09-20 (×22): qty 0.3

## 2020-09-20 NOTE — Progress Notes (Signed)
@  0330 , Pt had a coughing spell causing her to destat in the 70's . NRB was placed , which allowed her to get to desired 02 levels. Pt tolerated well and went back to sleep

## 2020-09-20 NOTE — Progress Notes (Signed)
PROGRESS NOTE    [name] [name]  VOZ:[id number] DOB: 11-03-1955 DOA: 08/18/2020 PCP: Glendale Chard, MD   Chief Complain: Chest pain, weakness, Covid positive  Brief Narrative: Patient is a 54 old female with history of renal transplant x2 on chronic immunosuppression agents, diabetes, hypothyroidism who presented with shortness of breath initially.  She was found to be in acute hypoxic respiratory failure due to Covid pneumonia.  Hospital course was prolonged and was complicated by slow improvement from severe hypoxia, AKI and non-STEMI.  Continues to require high flow oxygen.  After discussion on goals of care, she had remained DNR and plan was for initiation of comfort care if she worsens but on 2/10, she wanted to be full code again.  Goals of care discussion done with the patient and daughter again and code status changed to DNR.  Assessment & Plan:   Principal Problem:   ARF (acute renal failure) (HCC) Active Problems:   AKI (acute kidney injury) ([place])   Renal transplant recipient   Immunocompromised ([place])   DM (diabetes mellitus), type 2 with renal complications ([place])   Essential hypertension with goal blood pressure less than 140/90   Acute hypoxemic respiratory failure due to COVID-19 ([place])   Respiratory failure (HCC)   Acute hyperkalemia   Diabetic ketoacidosis without coma associated with type 1 diabetes mellitus ([place])   Pressure injury of skin   Acute and chronic respiratory failure with hypoxia (HCC)   Acute hypoxic respiratory failure due to Covid pneumonia: Fully vaccinated patient.  Immunocompromised status due to renal transplant, immunosuppressive medications.  Initially presented with chest pain, shortness of breath.  Found to have multifocal pneumonia from Covid.  Completed treatment with IV steroids, baricitinib, remdesivir.  Also was treated with IV antibiotics for superimposed bacterial pneumonia.  Despite aggressive measures, she remains severely  hypoxic.  Several discussion was held with family/patient about goals of care.  Currently DNR.  If declines or worsens, possible consideration of comfort care measures(family aware and agreeable).  Continue pulmonary toileting, incentive spirometer.  Mobilization encouraged. Off isolation Currently she is on 30 L of oxygen high flow Also on Ativan and Dilaudid for dyspnea/air hunger.  Sinus pause:Happened on  1/20 with 4.5 seconds pause.  Beta-blocker discontinued.  Currently stable.  NSTEMI: Patient with chest pain on 1/23 with EKG changes.  Treated with  Nitroglycerin, morphine .  Echo is reassuring with preserved ejection fraction.  On aspirin/statin, completed 48 hours of IV heparin.  Cardiology signed off.  AKI on CKD stage IV/transplant kidney: Transplant x2.  AKI was most likely hemodynamically mediated.  Creatinine has remained acutely around 1.7.  Nephrology was following.  Immunosuppression has been resumed: On Myfortic, Prograf, prednisone.  Nephrology has signed off.  Transaminitis: LFTs trending down.  Right upper quadrant ultrasound is nonacute and showed some sludge/gallstones but no signs of cholecystitis.  Hepatitis panel negative.  Patient is asymptomatic.  Continue to monitor intermittently.  Chronic normocytic anemia: Most likely associated  with CKD.  No signs of bleeding.  Status post 1 unit of PRBC transfusion on 09/11/2020.  Continue monitoring hemoglobin intermittently  Thrombocytopenia: Most likely associated with acute illness.  Continue to monitor,stable  Complicated UTI: Completed course of Rocephin.  Hypothyroidism: Continue Synthyroid.  Repeat TFT in 3 months  Hypertension: Currently blood pressure stable.  Continue medications  Hyperlipidemia: Statin held due to elevated LFTs.  Currently stable  Obesity: BMI 33.9  DKA: Resolved.  No longer on insulin drip.  Continue current insulin regimen.  Hemoglobin  A1c of 8.2 as per 1/11.  Monitor blood sugars, mainly  while on prednisone.  Deconditioning/debility: PT/OT recommended  skilled nursing facility on discharge.  Goals of care:   After discussion on goals of care, she had remained DNR and plan was for initiation of comfort care if she worsens but on 2/10, she wanted to be full code again.  Goals of care discussion done with the patient and daughter again on 09/20/20  and code status changed to DNR. She will remain DNR   Pressure Injury 08/20/20 Coccyx Mid Stage 1 -  Intact skin with non-blanchable redness of a localized area usually over a bony prominence. (Active)  08/20/20 0800  Location: Coccyx  Location Orientation: Mid  Staging: Stage 1 -  Intact skin with non-blanchable redness of a localized area usually over a bony prominence.  Wound Description (Comments):   Present on Admission: Yes             DVT prophylaxis:Lovenox Code Status: DNR Family Communication: daughter on phone on 09/20/20 Status is: Inpatient  Remains inpatient appropriate because:Inpatient level of care appropriate due to severity of illness   Dispo: The patient is from: Home              Anticipated d/c is to: Skilled nursing facility              Anticipated d/c date is: > 3 days              Patient currently is not medically stable to d/c.   Difficult to place patient No    Consultants: None  Procedures:  Antimicrobials:  Anti-infectives (From admission, onward)   Start     Dose/Rate Route Frequency Ordered Stop   09/07/20 1030  vancomycin (VANCOCIN) IVPB 1000 mg/200 mL premix  Status:  Discontinued        1,000 mg 200 mL/hr over 60 Minutes Intravenous Every 48 hours 09/05/20 0933 09/07/20 0951   09/05/20 1030  vancomycin (VANCOREADY) IVPB 2000 mg/400 mL        2,000 mg 200 mL/hr over 120 Minutes Intravenous  Once 09/05/20 0933 09/05/20 2018   09/05/20 1030  ceFEPIme (MAXIPIME) 2 g in sodium chloride 0.9 % 100 mL IVPB  Status:  Discontinued        2 g 200 mL/hr over 30 Minutes Intravenous  Every 24 hours 09/05/20 0933 09/07/20 0951   08/20/20 1445  cefTRIAXone (ROCEPHIN) 1 g in sodium chloride 0.9 % 100 mL IVPB        1 g 200 mL/hr over 30 Minutes Intravenous Every 24 hours 08/20/20 1348 08/26/20 2147   08/20/20 1000  remdesivir 100 mg in sodium chloride 0.9 % 100 mL IVPB       "Followed by" Linked Group Details   100 mg 200 mL/hr over 30 Minutes Intravenous Daily 08/19/20 0021 08/23/20 1113   08/19/20 0130  remdesivir 200 mg in sodium chloride 0.9% 250 mL IVPB       "Followed by" Linked Group Details   200 mg 580 mL/hr over 30 Minutes Intravenous Once 08/19/20 0021 08/19/20 0314      Subjective:  Patient seen and examined the bedside this morning. Tachypneic and tachycardic during my evaluation. Had extensive discussion about goals of care, CODE STATUS at bedside. Also discussed with daughter on phone at the same time.   Objective: Vitals:   09/20/20 0318 09/20/20 0400 09/20/20 0412 09/20/20 0636  BP:    138/70  Pulse: (!) 102   Marland Kitchen)  114  Resp: (!) 26 (!) 23  20  Temp:    98.7 F (37.1 C)  TempSrc:      SpO2: 91% 91%  90%  Weight:   85.1 kg   Height:       No intake or output data in the 24 hours ending 09/20/20 0738 Filed Weights   09/18/20 0439 09/19/20 0442 09/20/20 0412  Weight: 85.1 kg 85.1 kg 85.1 kg    Examination:  General exam: Very deconditioned ,deconditioned. chronically looking Respiratory system: Bilateral diminished air sounds, rhonchi, tachypnea cardiovascular system: Sinus tachycardia,. No JVD, murmurs, rubs, gallops or clicks. Gastrointestinal system: Abdomen is nondistended, soft and nontender. No organomegaly or masses felt. Normal bowel sounds heard. Central nervous system: Alert and oriented. No focal neurological deficits. Extremities: No edema, no clubbing ,no cyanosis Skin: No rashes, lesions or ulcers,no icterus ,no pallor   Data Reviewed: I have personally reviewed following labs and imaging studies  CBC: Recent Labs  Lab  09/15/20 0028 09/18/20 0050  WBC 3.9* 3.2*  HGB 9.1* 9.4*  HCT 28.2* 29.2*  MCV 83.9 84.9  PLT 93* 035*   Basic Metabolic Panel: Recent Labs  Lab 09/16/20 0203 09/17/20 0031 09/18/20 0050 09/19/20 0229 09/20/20 0354  NA 137 138 139 138 139  K 3.5 3.8 4.2 4.0 4.3  CL 107 108 109 109 106  CO2 22 22 22  21* 23  GLUCOSE 168* 140* 56* 140* 144*  BUN 28* 24* 21 18 20   CREATININE 1.86* 1.75* 1.66* 1.87* 2.09*  CALCIUM 8.8* 9.0 9.0 9.3 9.5  PHOS 2.3* 2.0* 1.8* 1.8* 2.4*   GFR: Estimated Creatinine Clearance: 27.2 mL/min (A) (by C-G formula based on SCr of 2.09 mg/dL (H)). Liver Function Tests: Recent Labs  Lab 09/15/20 0028 09/16/20 0203 09/17/20 0031 09/18/20 0050 09/19/20 0229 09/20/20 0354  AST 19  --   --  15  --   --   ALT 59*  --   --  33  --   --   ALKPHOS 164*  --   --  128*  --   --   BILITOT 1.3*  --   --  1.2  --   --   PROT 5.4*  --   --  6.0*  --   --   ALBUMIN 2.9*  2.9* 2.8* 2.9* 3.0*  3.0* 2.9* 3.1*   No results for input(s): LIPASE, AMYLASE in the last 168 hours. No results for input(s): AMMONIA in the last 168 hours. Coagulation Profile: No results for input(s): INR, PROTIME in the last 168 hours. Cardiac Enzymes: Recent Labs  Lab 09/15/20 0028  CKTOTAL 37*   BNP (last 3 results) No results for input(s): PROBNP in the last 8760 hours. HbA1C: No results for input(s): HGBA1C in the last 72 hours. CBG: Recent Labs  Lab 09/18/20 2107 09/19/20 0733 09/19/20 1153 09/19/20 1548 09/19/20 2053  GLUCAP 254* 114* 215* 262* 273*   Lipid Profile: No results for input(s): CHOL, HDL, LDLCALC, TRIG, CHOLHDL, LDLDIRECT in the last 72 hours. Thyroid Function Tests: No results for input(s): TSH, T4TOTAL, FREET4, T3FREE, THYROIDAB in the last 72 hours. Anemia Panel: No results for input(s): VITAMINB12, FOLATE, FERRITIN, TIBC, IRON, RETICCTPCT in the last 72 hours. Sepsis Labs: No results for input(s): PROCALCITON, LATICACIDVEN in the last 168  hours.  No results found for this or any previous visit (from the past 240 hour(s)).       Radiology Studies: No results found.      Scheduled  Meds: . amLODipine  10 mg Oral Daily  . vitamin C  500 mg Oral Daily  . aspirin EC  81 mg Oral Daily  . darbepoetin (ARANESP) injection - NON-DIALYSIS  150 mcg Subcutaneous Q Tue-1800  . enoxaparin (LOVENOX) injection  40 mg Subcutaneous Q24H  . feeding supplement (NEPRO CARB STEADY)  237 mL Oral BID BM  . fluticasone  1 spray Each Nare Daily  . folic acid  1 mg Oral Daily  . hydrALAZINE  25 mg Oral Q8H  . insulin aspart  0-15 Units Subcutaneous TID WC  . insulin detemir  8 Units Subcutaneous BID  . isosorbide mononitrate  60 mg Oral Daily  . levothyroxine  50 mcg Oral Q0600  . LORazepam  1 mg Oral BID  . mouth rinse  15 mL Mouth Rinse BID  . mycophenolate  360 mg Oral BID  . pantoprazole  40 mg Oral BID  . predniSONE  5 mg Oral Q breakfast  . tacrolimus  3 mg Oral QPM  . tacrolimus  4 mg Oral Daily  . zinc sulfate  220 mg Oral Daily   Continuous Infusions: . sodium chloride    . sodium chloride Stopped (08/31/20 1545)     LOS: 32 days    Time spent: 25 mins.More than 50% of that time was spent in counseling and/or coordination of care.      Shelly Coss, MD [facility] P2/07/2021, 7:38 AM

## 2020-09-21 DIAGNOSIS — N179 Acute kidney failure, unspecified: Secondary | ICD-10-CM | POA: Diagnosis not present

## 2020-09-21 LAB — RENAL FUNCTION PANEL
Albumin: 2.8 g/dL — ABNORMAL LOW (ref 3.5–5.0)
Anion gap: 10 (ref 5–15)
BUN: 22 mg/dL (ref 8–23)
CO2: 22 mmol/L (ref 22–32)
Calcium: 9.1 mg/dL (ref 8.9–10.3)
Chloride: 105 mmol/L (ref 98–111)
Creatinine, Ser: 2.18 mg/dL — ABNORMAL HIGH (ref 0.44–1.00)
GFR, Estimated: 25 mL/min — ABNORMAL LOW (ref >60)
Glucose, Bld: 133 mg/dL — ABNORMAL HIGH (ref 70–99)
Phosphorus: 2.8 mg/dL (ref 2.5–4.6)
Potassium: 4.3 mmol/L (ref 3.5–5.1)
Sodium: 137 mmol/L (ref 135–145)

## 2020-09-21 LAB — GLUCOSE, CAPILLARY
Glucose-Capillary: 126 mg/dL — ABNORMAL HIGH (ref 70–99)
Glucose-Capillary: 260 mg/dL — ABNORMAL HIGH (ref 70–99)
Glucose-Capillary: 164 mg/dL — ABNORMAL HIGH (ref 70–99)
Glucose-Capillary: 296 mg/dL — ABNORMAL HIGH (ref 70–99)

## 2020-09-21 LAB — CBC
HCT: 28.5 % — ABNORMAL LOW (ref 36.0–46.0)
Hemoglobin: 9.1 g/dL — ABNORMAL LOW (ref 12.0–15.0)
MCH: 27.1 pg (ref 26.0–34.0)
MCHC: 31.9 g/dL (ref 30.0–36.0)
MCV: 84.8 fL (ref 80.0–100.0)
Platelets: 116 10*3/uL — ABNORMAL LOW (ref 150–400)
RBC: 3.36 MIL/uL — ABNORMAL LOW (ref 3.87–5.11)
RDW: 21 % — ABNORMAL HIGH (ref 11.5–15.5)
WBC: 2.9 10*3/uL — ABNORMAL LOW (ref 4.0–10.5)
nRBC: 0 % (ref 0.0–0.2)

## 2020-09-21 NOTE — Progress Notes (Signed)
PROGRESS NOTE    [name] [name]  [id number]:[id number] DOB: 1956/03/01 DOA: 08/18/2020 PCP: Glendale Chard, MD   Chief Complain: Chest pain, weakness, Covid positive  Brief Narrative: Patient is a 85 old female with history of renal transplant x2 on chronic immunosuppression agents, diabetes, hypothyroidism who presented with shortness of breath initially.  She was found to be in acute hypoxic respiratory failure due to Covid pneumonia.  Hospital course was prolonged and was complicated by slow improvement from severe hypoxia, AKI and non-STEMI.  Continues to require high flow oxygen.  After discussion on goals of care, she had remained DNR and plan was for initiation of comfort care if she worsens but on 2/10, she wanted to be full code again.  Goals of care discussion done with the patient and daughter again on 09/20/20  and code status changed to DNR.  Assessment & Plan:   Principal Problem:   ARF (acute renal failure) (HCC) Active Problems:   AKI (acute kidney injury) ([place])   Renal transplant recipient   Immunocompromised ([place])   DM (diabetes mellitus), type 2 with renal complications ([place])   Essential hypertension with goal blood pressure less than 140/90   Acute hypoxemic respiratory failure due to COVID-19 ([place])   Respiratory failure (HCC)   Acute hyperkalemia   Diabetic ketoacidosis without coma associated with type 1 diabetes mellitus ([place])   Pressure injury of skin   Acute and chronic respiratory failure with hypoxia (HCC)   Acute hypoxic respiratory failure due to Covid pneumonia: Fully vaccinated patient.  Immunocompromised status due to renal transplant, immunosuppressive medications.  Initially presented with chest pain, shortness of breath.  Found to have multifocal pneumonia from Covid.  Completed treatment with IV steroids, baricitinib, remdesivir.  Also was treated with IV antibiotics for superimposed bacterial pneumonia.  Despite aggressive measures, she remains  severely hypoxic.  Several discussion was held with family/patient about goals of care.  Currently DNR.  If declines or worsens, possible consideration of comfort care measures(family aware and agreeable).  Continue pulmonary toileting, incentive spirometer.  Mobilization encouraged. Off isolation Currently she is on 35 L of oxygen high flow Also on Ativan and Dilaudid for dyspnea/air hunger.  Sinus pause:Happened on  1/20 with 4.5 seconds pause.  Beta-blocker discontinued.  Currently stable.  NSTEMI: Patient with chest pain on 1/23 with EKG changes.  Treated with  Nitroglycerin, morphine .  Echo is reassuring with preserved ejection fraction.  On aspirin/statin, completed 48 hours of IV heparin.  Cardiology signed off.  AKI on CKD stage IV/transplant kidney: Transplant x2.  AKI was most likely hemodynamically mediated.  Creatinine has remained acutely around 1.7.  Nephrology was following.  Immunosuppression has been resumed: On Myfortic, Prograf, prednisone.  Nephrology has signed off.  Monitor BMP intermittently.  Transaminitis: LFTs trending down.  Right upper quadrant ultrasound is nonacute and showed some sludge/gallstones but no signs of cholecystitis.  Hepatitis panel negative.  Patient is asymptomatic.  Continue to monitor intermittently.  Chronic normocytic anemia: Most likely associated  with CKD.  No signs of bleeding.  Status post 1 unit of PRBC transfusion on 09/11/2020.  Continue monitoring hemoglobin intermittently  Thrombocytopenia: Most likely associated with acute illness.  Continue to monitor,stable  Complicated UTI: Completed course of Rocephin.  Hypothyroidism: Continue Synthyroid.  Repeat TFT in 3 months  Hypertension: Currently blood pressure stable.  Continue medications  Hyperlipidemia: Statin held due to elevated LFTs.  Currently stable  Obesity: BMI 33.9  DKA: Resolved.  No longer on insulin drip.  Continue current insulin regimen.  Hemoglobin A1c of 8.2 as per  1/11.  Monitor blood sugars.  Deconditioning/debility: PT/OT recommended  skilled nursing facility on discharge.  Goals of care:   After discussion on goals of care, she had remained DNR and plan was for initiation of comfort care if she worsens but on 2/10, she wanted to be full code again.  Goals of care discussion done with the patient and daughter again on 09/20/20  and code status changed to DNR. She will remain DNR.  Respiratory status has not improved.  I will ask of decompensation.  As described above, if she decompensates, comfort care is the plan.   Pressure Injury 08/20/20 Coccyx Mid Stage 1 -  Intact skin with non-blanchable redness of a localized area usually over a bony prominence. (Active)  08/20/20 0800  Location: Coccyx  Location Orientation: Mid  Staging: Stage 1 -  Intact skin with non-blanchable redness of a localized area usually over a bony prominence.  Wound Description (Comments):   Present on Admission: Yes             DVT prophylaxis:Lovenox Code Status: DNR Family Communication: daughter on phone on 09/20/20 Status is: Inpatient  Remains inpatient appropriate because:Inpatient level of care appropriate due to severity of illness   Dispo: The patient is from: Home              Anticipated d/c is to: Skilled nursing facility              Anticipated d/c date is: > 3 days              Patient currently is not medically stable to d/c.   Difficult to place patient No    Consultants: None  Procedures:  Antimicrobials:  Anti-infectives (From admission, onward)   Start     Dose/Rate Route Frequency Ordered Stop   09/07/20 1030  vancomycin (VANCOCIN) IVPB 1000 mg/200 mL premix  Status:  Discontinued        1,000 mg 200 mL/hr over 60 Minutes Intravenous Every 48 hours 09/05/20 0933 09/07/20 0951   09/05/20 1030  vancomycin (VANCOREADY) IVPB 2000 mg/400 mL        2,000 mg 200 mL/hr over 120 Minutes Intravenous  Once 09/05/20 0933 09/05/20 2018    09/05/20 1030  ceFEPIme (MAXIPIME) 2 g in sodium chloride 0.9 % 100 mL IVPB  Status:  Discontinued        2 g 200 mL/hr over 30 Minutes Intravenous Every 24 hours 09/05/20 0933 09/07/20 0951   08/20/20 1445  cefTRIAXone (ROCEPHIN) 1 g in sodium chloride 0.9 % 100 mL IVPB        1 g 200 mL/hr over 30 Minutes Intravenous Every 24 hours 08/20/20 1348 08/26/20 2147   08/20/20 1000  remdesivir 100 mg in sodium chloride 0.9 % 100 mL IVPB       "Followed by" Linked Group Details   100 mg 200 mL/hr over 30 Minutes Intravenous Daily 08/19/20 0021 08/23/20 1113   08/19/20 0130  remdesivir 200 mg in sodium chloride 0.9% 250 mL IVPB       "Followed by" Linked Group Details   200 mg 580 mL/hr over 30 Minutes Intravenous Once 08/19/20 0021 08/19/20 0314      Subjective:  Patient seen and examined at bedside this morning. Changes from yesterday.  She was on 30 to 35 L of oxygen, also nonrebreather.  She appears a congestion tachypneic.  When asked how are you  feeling she says she feels fine and denies any worsening shortness of breath.  Objective: Vitals:   09/20/20 1957 09/20/20 2135 09/21/20 0141 09/21/20 0423  BP: 110/60 126/67  116/69  Pulse: 97 94 95 100  Resp: 18 20 (!) 30 18  Temp: 98.5 F (36.9 C) 98.4 F (36.9 C)  100 F (37.8 C)  TempSrc:  Oral  Oral  SpO2: 93% 92% 96% 95%  Weight:      Height:       No intake or output data in the 24 hours ending 09/21/20 0736 Filed Weights   09/18/20 0439 09/19/20 0442 09/20/20 0412  Weight: 85.1 kg 85.1 kg 85.1 kg    Examination:  General exam: Deconditioned, debilitated, chronically ill  looking Respiratory system: Diminished air sounds bilaterally, tachypnea,some rhonchi Cardiovascular system: Sinus tachycardia. No JVD, murmurs, rubs, gallops or clicks. Gastrointestinal system: Abdomen is nondistended, soft and nontender. No organomegaly or masses felt. Normal bowel sounds heard. Central nervous system: Alert and oriented. No focal  neurological deficits. Extremities: No edema, no clubbing ,no cyanosis Skin: No rashes, lesions or ulcers,no icterus ,no pallor   Data Reviewed: I have personally reviewed following labs and imaging studies  CBC: Recent Labs  Lab 09/15/20 0028 09/18/20 0050 09/21/20 0235  WBC 3.9* 3.2* 2.9*  HGB 9.1* 9.4* 9.1*  HCT 28.2* 29.2* 28.5*  MCV 83.9 84.9 84.8  PLT 93* 125* 734*   Basic Metabolic Panel: Recent Labs  Lab 09/17/20 0031 09/18/20 0050 09/19/20 0229 09/20/20 0354 09/21/20 0235  NA 138 139 138 139 137  K 3.8 4.2 4.0 4.3 4.3  CL 108 109 109 106 105  CO2 22 22 21* 23 22  GLUCOSE 140* 56* 140* 144* 133*  BUN 24* 21 18 20 22   CREATININE 1.75* 1.66* 1.87* 2.09* 2.18*  CALCIUM 9.0 9.0 9.3 9.5 9.1  PHOS 2.0* 1.8* 1.8* 2.4* 2.8   GFR: Estimated Creatinine Clearance: 26 mL/min (A) (by C-G formula based on SCr of 2.18 mg/dL (H)). Liver Function Tests: Recent Labs  Lab 09/15/20 0028 09/16/20 0203 09/17/20 0031 09/18/20 0050 09/19/20 0229 09/20/20 0354 09/21/20 0235  AST 19  --   --  15  --   --   --   ALT 59*  --   --  33  --   --   --   ALKPHOS 164*  --   --  128*  --   --   --   BILITOT 1.3*  --   --  1.2  --   --   --   PROT 5.4*  --   --  6.0*  --   --   --   ALBUMIN 2.9*  2.9*   < > 2.9* 3.0*  3.0* 2.9* 3.1* 2.8*   < > = values in this interval not displayed.   No results for input(s): LIPASE, AMYLASE in the last 168 hours. No results for input(s): AMMONIA in the last 168 hours. Coagulation Profile: No results for input(s): INR, PROTIME in the last 168 hours. Cardiac Enzymes: Recent Labs  Lab 09/15/20 0028  CKTOTAL 37*   BNP (last 3 results) No results for input(s): PROBNP in the last 8760 hours. HbA1C: No results for input(s): HGBA1C in the last 72 hours. CBG: Recent Labs  Lab 09/19/20 2053 09/20/20 0738 09/20/20 1146 09/20/20 1557 09/20/20 2129  GLUCAP 273* 153* 157* 188* 239*   Lipid Profile: No results for input(s): CHOL, HDL,  LDLCALC, TRIG, CHOLHDL, LDLDIRECT in the last  72 hours. Thyroid Function Tests: No results for input(s): TSH, T4TOTAL, FREET4, T3FREE, THYROIDAB in the last 72 hours. Anemia Panel: No results for input(s): VITAMINB12, FOLATE, FERRITIN, TIBC, IRON, RETICCTPCT in the last 72 hours. Sepsis Labs: No results for input(s): PROCALCITON, LATICACIDVEN in the last 168 hours.  No results found for this or any previous visit (from the past 240 hour(s)).       Radiology Studies: No results found.      Scheduled Meds: . amLODipine  10 mg Oral Daily  . vitamin C  500 mg Oral Daily  . aspirin EC  81 mg Oral Daily  . darbepoetin (ARANESP) injection - NON-DIALYSIS  150 mcg Subcutaneous Q Tue-1800  . enoxaparin (LOVENOX) injection  30 mg Subcutaneous Q24H  . feeding supplement (NEPRO CARB STEADY)  237 mL Oral BID BM  . fluticasone  1 spray Each Nare Daily  . folic acid  1 mg Oral Daily  . hydrALAZINE  25 mg Oral Q8H  . insulin aspart  0-15 Units Subcutaneous TID WC  . insulin detemir  8 Units Subcutaneous BID  . isosorbide mononitrate  60 mg Oral Daily  . levothyroxine  50 mcg Oral Q0600  . LORazepam  1 mg Oral BID  . mouth rinse  15 mL Mouth Rinse BID  . mycophenolate  360 mg Oral BID  . pantoprazole  40 mg Oral BID  . predniSONE  5 mg Oral Q breakfast  . tacrolimus  3 mg Oral QPM  . tacrolimus  4 mg Oral Daily  . zinc sulfate  220 mg Oral Daily   Continuous Infusions: . sodium chloride    . sodium chloride Stopped (08/31/20 1545)     LOS: 33 days    Time spent: 25 mins.More than 50% of that time was spent in counseling and/or coordination of care.      Shelly Coss, MD [facility] P2/13/2022, 7:36 AM

## 2020-09-22 ENCOUNTER — Inpatient Hospital Stay (HOSPITAL_COMMUNITY): Payer: HMO

## 2020-09-22 DIAGNOSIS — J9621 Acute and chronic respiratory failure with hypoxia: Secondary | ICD-10-CM | POA: Diagnosis not present

## 2020-09-22 DIAGNOSIS — E1121 Type 2 diabetes mellitus with diabetic nephropathy: Secondary | ICD-10-CM | POA: Diagnosis not present

## 2020-09-22 DIAGNOSIS — U071 COVID-19: Secondary | ICD-10-CM | POA: Diagnosis not present

## 2020-09-22 DIAGNOSIS — R509 Fever, unspecified: Secondary | ICD-10-CM

## 2020-09-22 DIAGNOSIS — D849 Immunodeficiency, unspecified: Secondary | ICD-10-CM | POA: Diagnosis not present

## 2020-09-22 LAB — C-REACTIVE PROTEIN: CRP: 5.2 mg/dL — ABNORMAL HIGH (ref <1.0)

## 2020-09-22 LAB — CK: Total CK: 15 U/L — ABNORMAL LOW (ref 38–234)

## 2020-09-22 LAB — SAR COV2 SEROLOGY (COVID19)AB(IGG),IA: SARS-CoV-2 Ab, IgG: REACTIVE — AB

## 2020-09-22 LAB — GLUCOSE, CAPILLARY
Glucose-Capillary: 180 mg/dL — ABNORMAL HIGH (ref 70–99)
Glucose-Capillary: 205 mg/dL — ABNORMAL HIGH (ref 70–99)
Glucose-Capillary: 185 mg/dL — ABNORMAL HIGH (ref 70–99)
Glucose-Capillary: 305 mg/dL — ABNORMAL HIGH (ref 70–99)

## 2020-09-22 LAB — PROCALCITONIN: Procalcitonin: 0.1 ng/mL

## 2020-09-22 LAB — LACTATE DEHYDROGENASE: LDH: 253 U/L — ABNORMAL HIGH (ref 98–192)

## 2020-09-22 MED ORDER — METHYLPREDNISOLONE SODIUM SUCC 125 MG IJ SOLR
60.0000 mg | Freq: Four times a day (QID) | INTRAMUSCULAR | Status: DC
Start: 2020-09-22 — End: 2020-09-22

## 2020-09-22 MED ORDER — METHYLPREDNISOLONE SODIUM SUCC 125 MG IJ SOLR
60.0000 mg | Freq: Four times a day (QID) | INTRAMUSCULAR | Status: DC
  Administered 2020-09-22 – 2020-10-01 (×33): 60 mg via INTRAVENOUS
  Filled 2020-09-22 (×35): qty 2

## 2020-09-22 MED ORDER — SODIUM CHLORIDE 0.9 % IV SOLN
2.0000 g | INTRAVENOUS | Status: AC
  Administered 2020-09-22 – 2020-09-27 (×6): 2 g via INTRAVENOUS
  Filled 2020-09-22 (×6): qty 2

## 2020-09-22 MED ORDER — VANCOMYCIN HCL 1000 MG/200ML IV SOLN
1000.0000 mg | INTRAVENOUS | Status: DC
  Filled 2020-09-22: qty 200

## 2020-09-22 MED ORDER — VANCOMYCIN HCL 1750 MG/350ML IV SOLN
1750.0000 mg | Freq: Once | INTRAVENOUS | Status: AC
  Administered 2020-09-22: 1750 mg via INTRAVENOUS
  Filled 2020-09-22: qty 350

## 2020-09-22 NOTE — Progress Notes (Signed)
Inpatient Diabetes Program Recommendations  AACE/ADA: New Consensus Statement on Inpatient Glycemic Control   Target Ranges:  Prepandial:   less than 140 mg/dL      Peak postprandial:   less than 180 mg/dL (1-2 hours)      Critically ill patients:  140 - 180 mg/dL   Results for [name] [name], [name] [name] (MRN [id number]) as of 09/22/2020 11:14  Ref. Range 09/21/2020 07:56 09/21/2020 12:03 09/21/2020 16:05 09/21/2020 20:53 09/22/2020 08:09  Glucose-Capillary Latest Ref Range: 70 - 99 mg/dL 126 (H) 260 (H) 164 (H) 296 (H) 180 (H)   Review of Glycemic Control  Diabetes history: DM Outpatient Diabetes medications: Levemir 30 units QAM, Levemir 35 units QHS, Novolog 4 units TID with meals Current orders for Inpatient glycemic control: Levemir 8 units BID, Novolog 0-15 units TID with meals; Prednisone 5 mg QAM  Inpatient Diabetes Program Recommendations:    Insulin: If steroids are continued, please consider ordering Novolog 3 units TID with meals for meal coverage if patient eats at least 50% of meals.  Thanks, Barnie Alderman, RN, MSN, CDE Diabetes Coordinator Inpatient Diabetes Program [phone] (Team Pager from 8am to 5pm)

## 2020-09-22 NOTE — Progress Notes (Signed)
Pharmacy Antibiotic Note  [name] [name] is a 64 y.o. female admitted on 08/18/2020 with HCAP.  Pharmacy has been consulted for Vancomycin and cefepime dosing.  Plan: Vancomycin 1750mg  IV x 1, then 1000mg  IV q48h for est AUC 563 using Scr 2.18 Cefepime 2gm IV q24h Monitor for clinical course and deescalation.   Height: 5\' 2"  (157.5 cm) Weight: 83.3 kg (183 lb 10.3 oz) IBW/kg (Calculated) : 50.1  Temp (24hrs), Avg:99.2 F (37.3 C), Min:97.2 F (36.2 C), Max:100.4 F (38 C)  Recent Labs  Lab 09/17/20 0031 09/18/20 0050 09/19/20 0229 09/20/20 0354 09/21/20 0235  WBC  --  3.2*  --   --  2.9*  CREATININE 1.75* 1.66* 1.87* 2.09* 2.18*    Estimated Creatinine Clearance: 25.8 mL/min (A) (by C-G formula based on SCr of 2.18 mg/dL (H)).    Allergies  Allergen Reactions  . Codeine Hives  . Acetaminophen-Codeine Rash    Antimicrobials this admission: Remdesivir 1/11 >> 1/15 Baricitinib 1/16 >> 1/29 1/28  Cefepime >> 1/30, 2/14>> 1/28 Vancomycin  >> 1/30, 2/14>> CTX 1/12>>1/18Remdesivir 1/11 >> 1/15  Dose adjustments this admission: n/a  Microbiology results: 11/10 BCx: NG 1/11 UCx: >100K Kleb  1/28  MRSA PCR: neg  [name] A. Levada Dy, PharmD, BCPS, FNKF Clinical Pharmacist [place] Please utilize Amion for appropriate phone number to reach the unit pharmacist ([place])   09/22/2020 10:31 AM

## 2020-09-22 NOTE — Progress Notes (Signed)
[facility] PROGRESS NOTE  [name] [name] QQV:[id number] DOB: [birth year] DOA: 08/18/2020 PCP: Glendale Chard, MD  Status: Remains inpatient appropriate because:Unsafe d/c plan, IV treatments appropriate due to intensity of illness or inability to take PO and Inpatient level of care appropriate due to severity of illness   Dispo:  Patient From: Home  Planned Disposition: [place]  Expected discharge date: 09/29/2020  Medically stable for discharge: No  Difficult to place: Yes; Barriers to DC: Continues to require very high levels of oxygen secondary to Covid lung injury.  She is physically deconditioned and requires SNF for rehabilitative therapies but given high level of oxygen requirements not a candidate for SNF at this time.   Level of care: Progressive  Code Status: DNR  Family Communication: Patient only DVT prophylaxis: Lovenox Vaccination status: Fully vaccinated against Covid prior to admission.  Initial vaccine November 2021 with follow-up vaccines March and April 2021  Foley catheter: No  HPI: 60 old female with history of renal transplant x2 on chronic immunosuppression agents, diabetes, hypothyroidism who presented with shortness of breath initially.  She was found to be in acute hypoxic respiratory failure due to Covid pneumonia.  Hospital course was prolonged and was complicated by slow improvement from severe hypoxia, AKI and non-STEMI.  Continues to require high flow oxygen.  Patient was a DNR until 2/10 at which point she changed back to full code. Further discussions held w/ pt and dtr and code status changed back to DNR on 2/12   Subjective: Sleeping and when sleeping breathing more comfortably.  States is utilizing as needed IV Dilaudid to help with air hunger.  No complaints reported  Objective: Vitals:   09/22/20 0533 09/22/20 0534  BP: 123/60 123/60  Pulse: (!) 108 (!) 107  Resp: (!) 28 (!) 28  Temp: (!) 100.4 F (38 C) (!)  100.4 F (38 C)  SpO2: 95% 95%    Intake/Output Summary (Last 24 hours) at 09/22/2020 0726 Last data filed at 09/22/2020 0301 Gross per 24 hour  Intake -  Output 650 ml  Net -650 ml   Filed Weights   09/19/20 0442 09/20/20 0412 09/22/20 0534  Weight: 85.1 kg 85.1 kg 83.3 kg    Exam:  Constitutional: Sleeping but easily awakened, more comfortable when asleep when awake.  Does have mild distress as evidenced by increased work of breathing while trying to talk Respiratory: Lung sounds clear to auscultation and diminished throughout, HHFNC the 5 L / 90% FiO2 with O2 sats between 94 to 97%.  Higher when sleeping increased work of breathing decreased while sleeping and increased while awake and talking Cardiovascular: Mild tachycardia, sinus rhythm, pulse regular, extremities warm to touch Abdomen: Soft nontender nondistended.  Intake.  Bowel sounds present.  LBM 2/13 Neurologic: CN 2-12 grossly intact. Sensation intact, DTR normal. Strength 3-4/5 x all 4 extremities.  Psychiatric: Normal judgment and insight.  Awakens easily and oriented x 3.     Assessment/Plan: Acute problems: Sepsis /acute on chronic hypoxic respiratory failure due to Covid pneumonia/new hospital associated pneumonia as of 2/14:  Fully vaccinated patient.  Immunocompromised status due to renal transplant, immunosuppressive medications.   Initially presented with chest pain, shortness of breath.  Found to have multifocal pneumonia from Covid.   Completed treatment with IV steroids, baricitinib, remdesivir.  Also was treated with IV antibiotics for superimposed bacterial pneumonia.   Despite aggressive measures, she remains severely hypoxic.  Several discussion was held with family/patient about goals of care.  Patient  reverse DNR status on 2/10.  CODE STATUS was discussed again today Continue pulmonary toileting, incentive spirometer.   Significant difficulty mobilizing given any amount of activity precipitate severe  increased work of breathing and O2 sats decreased into the 70s Continue scheduled Ativan every 12 hours and IV Dilaudid 0.5 mg every 4 hours prn for air hunger and associated anxiety Off isolation Given low-grade fevers and increased O2 requirements could be experiencing post Covid acute inflammatory syndrome/pneumonitis therefore will check inflammatory markers and will change prednisone to IV Solu-Medrol if markers elevated Chest x-ray on 2/14 revealed diffuse patchy airspace opacities consistent with multifocal pneumonia therefore will initiate broad-spectrum antibiotics (cefepime/vancomycin per pharmacy)-blood cultures obtained earlier today.  Check procalcitonin as well WBC 2.9 which is concerning for evolving sepsis (leukopenia, axillary temperature 100 F, tachycardia,?  Altered mental status although patient is receiving benzodiazepines and narcotics for increased air hunger)  AKI on CKD stage IV/transplant kidney: T ransplant x2.  AKI was most likely hemodynamically mediated.   Creatinine has remained acutely around 1.7.   Nephrology has signed off  Continue Myfortic, Prograf, prednisone.  Avoid nephrotoxic medication Previous hyperkalemia resolved  DKA/diabetes mellitus on insulin with hyperglycemia DKA resolved.  No longer on insulin drip.   Continue current insulin regimen.   Hemoglobin A1c of 8.2 as per 1/11.   Likely secondary to chronic prednisone requirement Follow CBG and provide SSI  Sinus pause: Happened on  1/20 with 4.5 seconds pause.   Beta-blocker discontinued.  Currently stable.  NSTEMI:  Patient with chest pain on 1/23 with EKG changes.   Treated with Nitroglycerin, morphine and beta-blocker.   Echo is reassuring with preserved ejection fraction.   Continue aspirin/statin completed 48 hours of IV heparin.   Cardiology signed off.  Chronic normocytic anemia:  Most likely associated  with CKD.  No signs of bleeding.   Status post 1 unit of PRBC transfusion  on 09/11/2020.   Continue monitoring hemoglobin intermittently  Deconditioning/debility:  PT/OT recommended skilled nursing facility on discharge but current O2 requirements are too high to facilitate discharge to skilled nursing facility.  Stage I decubitus coccyx Pressure Injury 08/20/20 Coccyx Mid Stage 1 -  Intact skin with non-blanchable redness of a localized area usually over a bony prominence. (Active)  Date First Assessed/Time First Assessed: 08/20/20 0800   Location: Coccyx  Location Orientation: Mid  Staging: Stage 1 -  Intact skin with non-blanchable redness of a localized area usually over a bony prominence.  Present on Admission: Yes    Assessments 08/20/2020  9:00 AM 09/18/2020  7:40 PM  Dressing Type Foam - Lift dressing to assess site every shift None  Dressing Changed -  Dressing Change Frequency Every 3 days -  State of Healing - Other (Comment)  Site / Wound Assessment Clean;Dry;Pink Clean  Peri-wound Assessment - Intact  Wound Length (cm) 5 cm -  Wound Width (cm) 0.1 cm -  Wound Depth (cm) 0 cm -  Wound Surface Area (cm^2) 0.5 cm^2 -  Wound Volume (cm^3) 0 cm^3 -  Drainage Amount None None  Treatment Cleansed;Off loading -     No Linked orders to display     Wound / Incision (Open or Dehisced) 08/31/20 Puncture Abdomen Left;Lower From Heparin SQ injections. (Active)  Date First Assessed/Time First Assessed: 08/31/20 2000   Wound Type: Puncture  Location: Abdomen  Location Orientation: Left;Lower  Wound Description (Comments): From Heparin SQ injections.  Present on Admission: No    Assessments 08/31/2020  8:00 PM  09/18/2020  7:40 PM  Dressing Type - None  Site / Wound Assessment - Clean;Dry;Other (Comment);Purple  Peri-wound Assessment - Purple  Wound Length (cm) 0 cm -  Wound Width (cm) 0 cm -  Wound Depth (cm) 0 cm -  Wound Volume (cm^3) 0 cm^3 -  Wound Surface Area (cm^2) 0 cm^2 -  Drainage Amount - None     No Linked orders to display       Other  problems: Transaminitis:  Resolved likely secondary to COVID meds as well as hypoperfusion  Right upper quadrant ultrasound is nonacute and showed some sludge/gallstones but no signs of cholecystitis.   Hepatitis panel negative.    Thrombocytopenia:  Most likely associated with acute illness.  Continue to monitor,stable  Complicated UTI:  Completed course of Rocephin.  Hypothyroidism:  Continue Synthyroid.   Repeat TFT in 3 months  Hypertension:  Currently blood pressure stable.  Continue medications  Hyperlipidemia:  Statin held due to elevated LFTs.   LFTs have normalized so can consider resumption of statin although oral intake remains variable  Obesity:  BMI 33.9  Nutrition Status: Estimated body mass index is 33.59 kg/m as calculated from the following:   Height as of this encounter: 5\' 2"  (1.575 m).   Weight as of this encounter: 83.3 kg.  Poor oral intake 2/2 air hunger          Data Reviewed: Basic Metabolic Panel: Recent Labs  Lab 09/17/20 0031 09/18/20 0050 09/19/20 0229 09/20/20 0354 09/21/20 0235  NA 138 139 138 139 137  K 3.8 4.2 4.0 4.3 4.3  CL 108 109 109 106 105  CO2 22 22 21* 23 22  GLUCOSE 140* 56* 140* 144* 133*  BUN 24* 21 18 20 22   CREATININE 1.75* 1.66* 1.87* 2.09* 2.18*  CALCIUM 9.0 9.0 9.3 9.5 9.1  PHOS 2.0* 1.8* 1.8* 2.4* 2.8   Liver Function Tests: Recent Labs  Lab 09/17/20 0031 09/18/20 0050 09/19/20 0229 09/20/20 0354 09/21/20 0235  AST  --  15  --   --   --   ALT  --  33  --   --   --   ALKPHOS  --  128*  --   --   --   BILITOT  --  1.2  --   --   --   PROT  --  6.0*  --   --   --   ALBUMIN 2.9* 3.0*  3.0* 2.9* 3.1* 2.8*   No results for input(s): LIPASE, AMYLASE in the last 168 hours. No results for input(s): AMMONIA in the last 168 hours. CBC: Recent Labs  Lab 09/18/20 0050 09/21/20 0235  WBC 3.2* 2.9*  HGB 9.4* 9.1*  HCT 29.2* 28.5*  MCV 84.9 84.8  PLT 125* 116*   Cardiac Enzymes: No results  for input(s): CKTOTAL, CKMB, CKMBINDEX, TROPONINI in the last 168 hours. BNP (last 3 results) Recent Labs    09/11/20 0155 09/12/20 0113 09/15/20 0028  BNP 86.8 80.1 80.0    ProBNP (last 3 results) No results for input(s): PROBNP in the last 8760 hours.  CBG: Recent Labs  Lab 09/20/20 2129 09/21/20 0756 09/21/20 1203 09/21/20 1605 09/21/20 2053  GLUCAP 239* 126* 260* 164* 296*    No results found for this or any previous visit (from the past 240 hour(s)).   Studies: No results found.  Scheduled Meds: . amLODipine  10 mg Oral Daily  . vitamin C  500 mg Oral Daily  . aspirin  EC  81 mg Oral Daily  . darbepoetin (ARANESP) injection - NON-DIALYSIS  150 mcg Subcutaneous Q Tue-1800  . enoxaparin (LOVENOX) injection  30 mg Subcutaneous Q24H  . feeding supplement (NEPRO CARB STEADY)  237 mL Oral BID BM  . fluticasone  1 spray Each Nare Daily  . folic acid  1 mg Oral Daily  . hydrALAZINE  25 mg Oral Q8H  . insulin aspart  0-15 Units Subcutaneous TID WC  . insulin detemir  8 Units Subcutaneous BID  . isosorbide mononitrate  60 mg Oral Daily  . levothyroxine  50 mcg Oral Q0600  . LORazepam  1 mg Oral BID  . mouth rinse  15 mL Mouth Rinse BID  . mycophenolate  360 mg Oral BID  . pantoprazole  40 mg Oral BID  . predniSONE  5 mg Oral Q breakfast  . tacrolimus  3 mg Oral QPM  . tacrolimus  4 mg Oral Daily  . zinc sulfate  220 mg Oral Daily   Continuous Infusions: . sodium chloride    . sodium chloride Stopped (08/31/20 1545)    Principal Problem:   ARF (acute renal failure) ([facility]) Active Problems:   AKI (acute kidney injury) ([place])   Renal transplant recipient   Immunocompromised ([place])   DM (diabetes mellitus), type 2 with renal complications ([place])   Essential hypertension with goal blood pressure less than 140/90   Acute hypoxemic respiratory failure due to COVID-19 ([place])   Respiratory failure (HCC)   Acute hyperkalemia   Diabetic ketoacidosis without coma  associated with type 1 diabetes mellitus ([place])   Pressure injury of skin   Acute and chronic respiratory failure with hypoxia ([place])   Consultants:  None  Procedures:  Echocardiogram  Antibiotics: Anti-infectives (From admission, onward)   Start     Dose/Rate Route Frequency Ordered Stop   09/07/20 1030  vancomycin (VANCOCIN) IVPB 1000 mg/200 mL premix  Status:  Discontinued        1,000 mg 200 mL/hr over 60 Minutes Intravenous Every 48 hours 09/05/20 0933 09/07/20 0951   09/05/20 1030  vancomycin (VANCOREADY) IVPB 2000 mg/400 mL        2,000 mg 200 mL/hr over 120 Minutes Intravenous  Once 09/05/20 0933 09/05/20 2018   09/05/20 1030  ceFEPIme (MAXIPIME) 2 g in sodium chloride 0.9 % 100 mL IVPB  Status:  Discontinued        2 g 200 mL/hr over 30 Minutes Intravenous Every 24 hours 09/05/20 0933 09/07/20 0951   08/20/20 1445  cefTRIAXone (ROCEPHIN) 1 g in sodium chloride 0.9 % 100 mL IVPB        1 g 200 mL/hr over 30 Minutes Intravenous Every 24 hours 08/20/20 1348 08/26/20 2147   08/20/20 1000  remdesivir 100 mg in sodium chloride 0.9 % 100 mL IVPB       "Followed by" Linked Group Details   100 mg 200 mL/hr over 30 Minutes Intravenous Daily 08/19/20 0021 08/23/20 1113   08/19/20 0130  remdesivir 200 mg in sodium chloride 0.9% 250 mL IVPB       "Followed by" Linked Group Details   200 mg 580 mL/hr over 30 Minutes Intravenous Once 08/19/20 0021 08/19/20 0314       Time spent: 30 minutes    Erin Hearing ANP  [facility] 7 am - 330 pm/M-F for direct patient care and secure chat Please refer to Amion for contact info 34  days

## 2020-09-22 NOTE — Progress Notes (Signed)
Arrived to patient's room. Patient eating dinner. Requested [facility] come after she is done eating. Notified nurse. Nurse to reconsult when pt is ready. VU. Fran Lowes, RN [facility]

## 2020-09-22 NOTE — Progress Notes (Signed)
PT Cancellation Note  Patient Details Name: [name] [name] MRN: [id number] DOB: [birth year]   Cancelled Treatment:    Reason Eval/Treat Not Completed: Medical issues which prohibited therapy  On initial attempt @ 1308, pt reported she was soiled and wanted to get cleaned up prior to PT.   On return, RN reports pt was desaturating down to 60ish % during rolling and cleaning. She has increased her [place] and pt has slowly been recovering.   Will defer PT at this time and attempt later as schedule and pt's tolerance permits.   Arby Barrette, PT Pager [phone]  Rexanne Mano 09/22/2020, 1:58 PM

## 2020-09-23 ENCOUNTER — Inpatient Hospital Stay (HOSPITAL_COMMUNITY): Payer: HMO

## 2020-09-23 DIAGNOSIS — R509 Fever, unspecified: Secondary | ICD-10-CM | POA: Diagnosis not present

## 2020-09-23 DIAGNOSIS — R7989 Other specified abnormal findings of blood chemistry: Secondary | ICD-10-CM | POA: Diagnosis not present

## 2020-09-23 DIAGNOSIS — J9621 Acute and chronic respiratory failure with hypoxia: Secondary | ICD-10-CM

## 2020-09-23 DIAGNOSIS — U071 COVID-19: Secondary | ICD-10-CM

## 2020-09-23 DIAGNOSIS — E875 Hyperkalemia: Secondary | ICD-10-CM | POA: Diagnosis not present

## 2020-09-23 DIAGNOSIS — J189 Pneumonia, unspecified organism: Secondary | ICD-10-CM | POA: Diagnosis not present

## 2020-09-23 DIAGNOSIS — N179 Acute kidney failure, unspecified: Secondary | ICD-10-CM | POA: Diagnosis not present

## 2020-09-23 LAB — CRYPTOCOCCAL ANTIGEN: Crypto Ag: NEGATIVE

## 2020-09-23 LAB — PROCALCITONIN: Procalcitonin: <0.1 ng/mL

## 2020-09-23 LAB — GLUCOSE, CAPILLARY
Glucose-Capillary: 313 mg/dL — ABNORMAL HIGH (ref 70–99)
Glucose-Capillary: 351 mg/dL — ABNORMAL HIGH (ref 70–99)
Glucose-Capillary: 296 mg/dL — ABNORMAL HIGH (ref 70–99)
Glucose-Capillary: 327 mg/dL — ABNORMAL HIGH (ref 70–99)

## 2020-09-23 LAB — D-DIMER, QUANTITATIVE: D-Dimer, Quant: 0.78 ug/mL-FEU — ABNORMAL HIGH (ref 0.00–0.50)

## 2020-09-23 LAB — C-REACTIVE PROTEIN: CRP: 6.3 mg/dL — ABNORMAL HIGH (ref <1.0)

## 2020-09-23 MED ORDER — INSULIN ASPART 100 UNIT/ML ~~LOC~~ SOLN
0.0000 [IU] | Freq: Three times a day (TID) | SUBCUTANEOUS | Status: DC
  Administered 2020-09-23: 11 [IU] via SUBCUTANEOUS
  Administered 2020-09-23: 15 [IU] via SUBCUTANEOUS
  Administered 2020-09-24: 3 [IU] via SUBCUTANEOUS
  Administered 2020-09-24: 20 [IU] via SUBCUTANEOUS
  Administered 2020-09-24: 15 [IU] via SUBCUTANEOUS
  Administered 2020-09-25 – 2020-09-26 (×5): 7 [IU] via SUBCUTANEOUS
  Administered 2020-09-26 – 2020-09-27 (×2): 4 [IU] via SUBCUTANEOUS
  Administered 2020-09-27: 7 [IU] via SUBCUTANEOUS
  Administered 2020-09-27: 3 [IU] via SUBCUTANEOUS
  Administered 2020-09-28 (×2): 7 [IU] via SUBCUTANEOUS
  Administered 2020-09-28: 3 [IU] via SUBCUTANEOUS
  Administered 2020-09-29: 11 [IU] via SUBCUTANEOUS
  Administered 2020-09-29: 4 [IU] via SUBCUTANEOUS
  Administered 2020-09-29: 15 [IU] via SUBCUTANEOUS
  Administered 2020-09-30: 7 [IU] via SUBCUTANEOUS
  Administered 2020-09-30: 11 [IU] via SUBCUTANEOUS
  Administered 2020-09-30: 3 [IU] via SUBCUTANEOUS
  Administered 2020-10-01: 2 [IU] via SUBCUTANEOUS
  Administered 2020-10-01 – 2020-10-02 (×2): 3 [IU] via SUBCUTANEOUS
  Administered 2020-10-02: 7 [IU] via SUBCUTANEOUS
  Administered 2020-10-02: 4 [IU] via SUBCUTANEOUS
  Administered 2020-10-03: 11 [IU] via SUBCUTANEOUS
  Administered 2020-10-03: 7 [IU] via SUBCUTANEOUS
  Administered 2020-10-03: 11 [IU] via SUBCUTANEOUS
  Administered 2020-10-04: 7 [IU] via SUBCUTANEOUS
  Administered 2020-10-05: 3 [IU] via SUBCUTANEOUS
  Administered 2020-10-05: 2 [IU] via SUBCUTANEOUS
  Administered 2020-10-05 – 2020-10-06 (×2): 4 [IU] via SUBCUTANEOUS
  Administered 2020-10-06: 11 [IU] via SUBCUTANEOUS
  Administered 2020-10-06: 7 [IU] via SUBCUTANEOUS
  Administered 2020-10-07: 4 [IU] via SUBCUTANEOUS
  Administered 2020-10-08: 7 [IU] via SUBCUTANEOUS
  Administered 2020-10-09: 3 [IU] via SUBCUTANEOUS
  Administered 2020-10-09: 20 [IU] via SUBCUTANEOUS
  Administered 2020-10-09: 4 [IU] via SUBCUTANEOUS
  Administered 2020-10-10: 7 [IU] via SUBCUTANEOUS
  Administered 2020-10-10: 4 [IU] via SUBCUTANEOUS
  Administered 2020-10-10: 15 [IU] via SUBCUTANEOUS
  Administered 2020-10-11: 4 [IU] via SUBCUTANEOUS
  Administered 2020-10-11: 3 [IU] via SUBCUTANEOUS
  Administered 2020-10-12 – 2020-10-13 (×6): 4 [IU] via SUBCUTANEOUS

## 2020-09-23 MED ORDER — INSULIN DETEMIR 100 UNIT/ML ~~LOC~~ SOLN
10.0000 [IU] | Freq: Two times a day (BID) | SUBCUTANEOUS | Status: DC
  Administered 2020-09-23 (×2): 10 [IU] via SUBCUTANEOUS
  Filled 2020-09-23 (×4): qty 0.1

## 2020-09-23 NOTE — Progress Notes (Signed)
Bilateral lower extremity venous study completed.      Please see CV Proc for preliminary results.   Denaisha Swango, RVT  

## 2020-09-23 NOTE — Progress Notes (Addendum)
[facility] PROGRESS NOTE  [name] [name] CWC:[id number] DOB: 04/23/1956 DOA: 08/18/2020 PCP: Glendale Chard, MD  Status: Remains inpatient appropriate because:Unsafe d/c plan, IV treatments appropriate due to intensity of illness or inability to take PO and Inpatient level of care appropriate due to severity of illness   Dispo:  Patient From: Home  Planned Disposition: [place]  Expected discharge date: 09/29/2020  Medically stable for discharge: No  Difficult to place: Yes; Barriers to DC: Continues to require very high levels of oxygen secondary to Covid lung injury.  She is physically deconditioned and requires SNF for rehabilitative therapies but given high level of oxygen requirements not a candidate for SNF at this time.   Level of care: Progressive  Code Status: DNR  Family Communication: Patient; on 2/15 Mendel Ryder about change in status, change in medications, pulmonary consultation and impending imaging to better characterize underlying Covid lung injury DVT prophylaxis: Lovenox Vaccination status: Fully vaccinated against Covid prior to admission.  Initial vaccine November 2021 with follow-up vaccines March and April 2021  Foley catheter: No  HPI: 85 old female with history of renal transplant x2 on chronic immunosuppression agents, diabetes, hypothyroidism who presented with shortness of breath initially.  She was found to be in acute hypoxic respiratory failure due to Covid pneumonia.  Hospital course was prolonged and was complicated by slow improvement from severe hypoxia, AKI and non-STEMI.  Continues to require high flow oxygen.  Patient was a DNR until 2/10 at which point she changed back to full code. Further discussions held w/ pt and dtr and code status changed back to DNR on 2/12   Subjective: Alert and sitting up in chair.  Reports breathing better since change over to Solu-Medrol.  Also breathing better with more consistent usage of  Ativan and IV Dilaudid for air hunger.  Objective: Vitals:   09/23/20 0005 09/23/20 0523  BP: 131/70   Pulse: 96   Resp: 16   Temp: 98.5 F (36.9 C) 99 F (37.2 C)  SpO2: 94%     Intake/Output Summary (Last 24 hours) at 09/23/2020 0744 Last data filed at 09/23/2020 0600 Gross per 24 hour  Intake 250 ml  Output 200 ml  Net 50 ml   Filed Weights   09/19/20 0442 09/20/20 0412 09/22/20 0534  Weight: 85.1 kg 85.1 kg 83.3 kg    Exam:  Constitutional: Alert, calm in no acute distress Respiratory: Lung sounds decreased and slightly coarse to auscultation without any definitive crackles or wheezing.  Decreased in the bases.  Subtle increased work of breathing even at rest.  Will to phonate without having significant increased work of breathing today.  HHFNC the 40 L / 100% FiO2 with O2 sats have ranged today between 89% and 95% Cardiovascular: Tachycardia with ventricular rates between 106 and 118 bpm, normal heart sounds, no peripheral edema Abdomen: Soft and nontender.  Continues with variable oral intake.  Normoactive bowel sounds.  LBM 2/13 Neurologic: CN 2-12 grossly intact. Sensation intact, DTR normal. Strength 3-4/5 x all 4 extremities.  Psychiatric: Normal judgment and insight.  Awakens easily and oriented x 3.     Assessment/Plan: Acute problems: Sepsis /acute on chronic hypoxic respiratory failure due to Covid pneumonia/new hospital associated pneumonia as of 2/14:  Fully vaccinated patient.  Immunocompromised status due to renal transplant, immunosuppressive medications.   Initially presented with chest pain, shortness of breath.  Found to have multifocal pneumonia from Covid.   Completed treatment with IV steroids, baricitinib, remdesivir.  Also  was treated with IV antibiotics for superimposed bacterial pneumonia.   Despite aggressive measures, she remains severely hypoxic.  Several discussion was held with family/patient about goals of care.  Patient reverse DNR status on  2/10.  CODE STATUS was discussed again today Continue pulmonary toileting, incentive spirometer.   Significant difficulty mobilizing given any amount of activity precipitate severe increased work of breathing and O2 sats decreased into the 70s Continue scheduled Ativan every 12 hours and IV Dilaudid 0.5 mg every 4 hours prn for air hunger and associated anxiety Off isolation Continue Cefepime/Vancomycin IV-PCT WNL x 2-blood cxs pending Elevated CRP 2/14 so started Solumedrol 60 mg IV q 6hrs CRP trend: 5.2> 6.3 LDH: 253 2/15 PCCM consulted.  See note for details but in short recommendation is to continue antibiotics despite normal procalcitonin due to concerns of healthcare associated pneumonia.  Patient too sick/stressed to tolerate bronchoscopy.  Concern is for possible opportunistic organisms as well as possible PE.   Per PCCM:  High-resolution CT Check D-dimer and if elevated consider CT angiogram **D-dimer slightly elevated so lower extremity venous duplex and CTA chest ordered by PCCM** Continue Solu-Medrol Obtain CMV PCR, serum crypto antigen, Fungitell and Aspergillus antigen  AKI on CKD stage IV/transplant kidney: T ransplant x2.  AKI was most likely hemodynamically mediated.   Creatinine has remained acutely around 1.7.   Nephrology has signed off  Continue Myfortic, Prograf, prednisone.  Avoid nephrotoxic medication Previous hyperkalemia resolved  DKA/diabetes mellitus on insulin with hyperglycemia DKA resolved.  No longer on insulin drip.   2/15 Increased hyperglycemia w/ steroids so will increase Levemir to 10 units and increase SSI to resistent Hemoglobin A1c of 8.2 as per 1/11.   Likely secondary to chronic prednisone requirement Follow CBG and provide SSI  Sinus pause: Happened on  1/20 with 4.5 seconds pause.   Beta-blocker discontinued.  Currently stable.  NSTEMI:  Patient with chest pain on 1/23 with EKG changes.   Treated with Nitroglycerin, morphine and  beta-blocker.   Echo is reassuring with preserved ejection fraction.   Continue aspirin/statin completed 48 hours of IV heparin.   Cardiology signed off.  Chronic normocytic anemia:  Most likely associated  with CKD.  No signs of bleeding.   Status post 1 unit of PRBC transfusion on 09/11/2020.   Continue monitoring hemoglobin intermittently  Deconditioning/debility:  PT/OT recommended skilled nursing facility on discharge but current O2 requirements are too high to facilitate discharge to skilled nursing facility.  Stage I decubitus coccyx Pressure Injury 08/20/20 Coccyx Mid Stage 1 -  Intact skin with non-blanchable redness of a localized area usually over a bony prominence. (Active)  Date First Assessed/Time First Assessed: 08/20/20 0800   Location: Coccyx  Location Orientation: Mid  Staging: Stage 1 -  Intact skin with non-blanchable redness of a localized area usually over a bony prominence.  Present on Admission: Yes    Assessments 08/20/2020  9:00 AM 09/18/2020  7:40 PM  Dressing Type Foam - Lift dressing to assess site every shift None  Dressing Changed -  Dressing Change Frequency Every 3 days -  State of Healing - Other (Comment)  Site / Wound Assessment Clean;Dry;Pink Clean  Peri-wound Assessment - Intact  Wound Length (cm) 5 cm -  Wound Width (cm) 0.1 cm -  Wound Depth (cm) 0 cm -  Wound Surface Area (cm^2) 0.5 cm^2 -  Wound Volume (cm^3) 0 cm^3 -  Drainage Amount None None  Treatment Cleansed;Off loading -     No  Linked orders to display     Wound / Incision (Open or Dehisced) 08/31/20 Puncture Abdomen Left;Lower From Heparin SQ injections. (Active)  Date First Assessed/Time First Assessed: 08/31/20 2000   Wound Type: Puncture  Location: Abdomen  Location Orientation: Left;Lower  Wound Description (Comments): From Heparin SQ injections.  Present on Admission: No    Assessments 08/31/2020  8:00 PM 09/22/2020  7:00 AM  Dressing Type - None  Dressing Status -  Clean;Dry;Intact  Site / Wound Assessment - Purple  Peri-wound Assessment - Purple  Wound Length (cm) 0 cm -  Wound Width (cm) 0 cm -  Wound Depth (cm) 0 cm -  Wound Volume (cm^3) 0 cm^3 -  Wound Surface Area (cm^2) 0 cm^2 -  Drainage Amount - None     No Linked orders to display       Other problems: Transaminitis:  Resolved likely secondary to COVID meds as well as hypoperfusion  Right upper quadrant ultrasound is nonacute and showed some sludge/gallstones but no signs of cholecystitis.   Hepatitis panel negative.    Thrombocytopenia:  Most likely associated with acute illness.  Continue to monitor,stable  Complicated UTI:  Completed course of Rocephin.  Hypothyroidism:  Continue Synthyroid.   Repeat TFT in 3 months  Hypertension:  Currently blood pressure stable.  Continue medications  Hyperlipidemia:  Statin held due to elevated LFTs.   LFTs have normalized so can consider resumption of statin although oral intake remains variable  Obesity:  BMI 33.9  Nutrition Status: Estimated body mass index is 33.59 kg/m as calculated from the following:   Height as of this encounter: 5\' 2"  (1.575 m).   Weight as of this encounter: 83.3 kg.  Poor oral intake 2/2 air hunger          Data Reviewed: Basic Metabolic Panel: Recent Labs  Lab 09/17/20 0031 09/18/20 0050 09/19/20 0229 09/20/20 0354 09/21/20 0235  NA 138 139 138 139 137  K 3.8 4.2 4.0 4.3 4.3  CL 108 109 109 106 105  CO2 22 22 21* 23 22  GLUCOSE 140* 56* 140* 144* 133*  BUN 24* 21 18 20 22   CREATININE 1.75* 1.66* 1.87* 2.09* 2.18*  CALCIUM 9.0 9.0 9.3 9.5 9.1  PHOS 2.0* 1.8* 1.8* 2.4* 2.8   Liver Function Tests: Recent Labs  Lab 09/17/20 0031 09/18/20 0050 09/19/20 0229 09/20/20 0354 09/21/20 0235  AST  --  15  --   --   --   ALT  --  33  --   --   --   ALKPHOS  --  128*  --   --   --   BILITOT  --  1.2  --   --   --   PROT  --  6.0*  --   --   --   ALBUMIN 2.9* 3.0*  3.0*  2.9* 3.1* 2.8*   No results for input(s): LIPASE, AMYLASE in the last 168 hours. No results for input(s): AMMONIA in the last 168 hours. CBC: Recent Labs  Lab 09/18/20 0050 09/21/20 0235  WBC 3.2* 2.9*  HGB 9.4* 9.1*  HCT 29.2* 28.5*  MCV 84.9 84.8  PLT 125* 116*   Cardiac Enzymes: Recent Labs  Lab 09/22/20 1051  CKTOTAL 15*   BNP (last 3 results) Recent Labs    09/11/20 0155 09/12/20 0113 09/15/20 0028  BNP 86.8 80.1 80.0    ProBNP (last 3 results) No results for input(s): PROBNP in the last 8760 hours.  CBG:  Recent Labs  Lab 09/22/20 0809 09/22/20 1139 09/22/20 1736 09/22/20 2027 09/23/20 0726  GLUCAP 180* 205* 185* 305* 313*    Recent Results (from the past 240 hour(s))  Culture, blood (routine x 2)     Status: None (Preliminary result)   Collection Time: 09/22/20  8:26 AM   Specimen: BLOOD LEFT HAND  Result Value Ref Range Status   Specimen Description BLOOD LEFT HAND  Final   Special Requests   Final    BOTTLES DRAWN AEROBIC ONLY Blood Culture adequate volume   Culture   Final    NO GROWTH < 12 HOURS Performed at [facility], [place] [address] [place], [place] [address]    Report Status PENDING  Incomplete  Culture, blood (routine x 2)     Status: None (Preliminary result)   Collection Time: 09/22/20  8:27 AM   Specimen: BLOOD LEFT ARM  Result Value Ref Range Status   Specimen Description BLOOD LEFT ARM  Final   Special Requests   Final    BOTTLES DRAWN AEROBIC ONLY Blood Culture adequate volume   Culture   Final    NO GROWTH < 12 HOURS Performed at [facility], [place] [address], [place], [place] [address]    Report Status PENDING  Incomplete     Studies: DG CHEST PORT 1 VIEW  Result Date: 09/22/2020 CLINICAL DATA:  Hypoxia. EXAM: PORTABLE CHEST 1 VIEW COMPARISON:  September 12, 2020. FINDINGS: Stable cardiomegaly. No pneumothorax or significant pleural effusion is noted. Stable bilateral diffuse patchy airspace opacities are  noted consistent with multifocal pneumonia. Bony thorax is unremarkable. IMPRESSION: Stable bilateral diffuse patchy airspace opacities consistent with multifocal pneumonia. Electronically Signed   By: Marijo Conception M.D.   On: 09/22/2020 08:11    Scheduled Meds: . amLODipine  10 mg Oral Daily  . vitamin C  500 mg Oral Daily  . aspirin EC  81 mg Oral Daily  . darbepoetin (ARANESP) injection - NON-DIALYSIS  150 mcg Subcutaneous Q Tue-1800  . enoxaparin (LOVENOX) injection  30 mg Subcutaneous Q24H  . feeding supplement (NEPRO CARB STEADY)  237 mL Oral BID BM  . fluticasone  1 spray Each Nare Daily  . folic acid  1 mg Oral Daily  . hydrALAZINE  25 mg Oral Q8H  . insulin aspart  0-20 Units Subcutaneous TID WC  . insulin detemir  10 Units Subcutaneous BID  . isosorbide mononitrate  60 mg Oral Daily  . levothyroxine  50 mcg Oral Q0600  . LORazepam  1 mg Oral BID  . mouth rinse  15 mL Mouth Rinse BID  . methylPREDNISolone (SOLU-MEDROL) injection  60 mg Intravenous Q6H  . mycophenolate  360 mg Oral BID  . pantoprazole  40 mg Oral BID  . tacrolimus  3 mg Oral QPM  . tacrolimus  4 mg Oral Daily  . zinc sulfate  220 mg Oral Daily   Continuous Infusions: . sodium chloride    . ceFEPime (MAXIPIME) IV 2 g (09/22/20 1353)  . sodium chloride Stopped (08/31/20 1545)  . [START ON 09/24/2020] vancomycin      Principal Problem:   ARF (acute renal failure) (HCC) Active Problems:   AKI (acute kidney injury) ([place])   Fever, unspecified   Renal transplant recipient   Immunocompromised ([place])   DM (diabetes mellitus), type 2 with renal complications ([place])   Essential hypertension with goal blood pressure less than 140/90   Acute hypoxemic respiratory failure due to COVID-19 (  [place])   Respiratory failure ([place])   Acute hyperkalemia   Diabetic ketoacidosis without coma associated with type 1 diabetes mellitus ([place])   Pressure injury of skin   Acute and chronic respiratory failure with hypoxia  ([place])   Consultants:  None  Procedures:  Echocardiogram  Antibiotics: Anti-infectives (From admission, onward)   Start     Dose/Rate Route Frequency Ordered Stop   09/24/20 1200  vancomycin (VANCOREADY) IVPB 1000 mg/200 mL        1,000 mg 200 mL/hr over 60 Minutes Intravenous Every 48 hours 09/22/20 1036     09/22/20 1130  vancomycin (VANCOREADY) IVPB 1750 mg/350 mL        1,750 mg 175 mL/hr over 120 Minutes Intravenous  Once 09/22/20 1036 09/22/20 1735   09/22/20 1130  ceFEPIme (MAXIPIME) 2 g in sodium chloride 0.9 % 100 mL IVPB        2 g 200 mL/hr over 30 Minutes Intravenous Every 24 hours 09/22/20 1036     09/07/20 1030  vancomycin (VANCOCIN) IVPB 1000 mg/200 mL premix  Status:  Discontinued        1,000 mg 200 mL/hr over 60 Minutes Intravenous Every 48 hours 09/05/20 0933 09/07/20 0951   09/05/20 1030  vancomycin (VANCOREADY) IVPB 2000 mg/400 mL        2,000 mg 200 mL/hr over 120 Minutes Intravenous  Once 09/05/20 0933 09/05/20 2018   09/05/20 1030  ceFEPIme (MAXIPIME) 2 g in sodium chloride 0.9 % 100 mL IVPB  Status:  Discontinued        2 g 200 mL/hr over 30 Minutes Intravenous Every 24 hours 09/05/20 0933 09/07/20 0951   08/20/20 1445  cefTRIAXone (ROCEPHIN) 1 g in sodium chloride 0.9 % 100 mL IVPB        1 g 200 mL/hr over 30 Minutes Intravenous Every 24 hours 08/20/20 1348 08/26/20 2147   08/20/20 1000  remdesivir 100 mg in sodium chloride 0.9 % 100 mL IVPB       "Followed by" Linked Group Details   100 mg 200 mL/hr over 30 Minutes Intravenous Daily 08/19/20 0021 08/23/20 1113   08/19/20 0130  remdesivir 200 mg in sodium chloride 0.9% 250 mL IVPB       "Followed by" Linked Group Details   200 mg 580 mL/hr over 30 Minutes Intravenous Once 08/19/20 0021 08/19/20 0314       Time spent: 30 minutes    Erin Hearing ANP  [facility] 7 am - 330 pm/M-F for direct patient care and secure chat Please refer to Amion for contact info 35   days

## 2020-09-23 NOTE — Progress Notes (Signed)
NAME:  [name] [name], MRN:  [id number], DOB:  [birth year], LOS: 62 ADMISSION DATE:  08/18/2020, CONSULTATION DATE:  1/10 REFERRING MD:  Hiram Comber, CHIEF COMPLAINT:  Dyspnea   Brief History:  65 y/o immunocompromised female admitted with COVID pneumonia on 1/20 after testing positive on 12/29. She was admitted to the ICU where she required heated high flow O2 while receiving treatment for COVID 19.  Her oxygenation improved but she has been difficult to discharge due to her COVID status and other complicating factors.  PCCM was consulted again on 1/28 when she developed worsening hypoxemic respiratory failure.  At that time her code status was made DNR and she was treated with broad spectrum antibiotics.  PCCM consulted again on 2/15 due to persistent hypoxemic respiratory failure.    History of Present Illness:  65 y/o immunocompromised female admitted with COVID pneumonia on 1/20 after testing positive on 12/29. She was admitted to the ICU where she required heated high flow O2 while receiving treatment for COVID 19.  Her oxygenation improved but she has been difficult to discharge due to her COVID status and other complicating factors.  PCCM was consulted again on 1/28 when she developed worsening hypoxemic respiratory failure.  At that time her code status was made DNR and she was treated with broad spectrum antibiotics.  PCCM consulted again on 2/15 due to persistent hypoxemic respiratory failure.   At baseline she has a history of renal transplant.  She has been treated with cellcept and prograf here in the hospital.  Today (2/15) she tells me that she has sinus congestion, dry cough.  She didn't feel febrile two days ago.  She denies leg swelling.    Past Medical History:  CKD, s/p renal transplant  Gout due to renal transplant medications Hyperlipidemia Hypertension Sickle cell trait Thyroid disease Type II diabetes UTI  Significant Hospital Events:  1/10 admitted  [facility] 1/11 ICU and CCM 1/14 To [facility]>> 5W  Consults:  Nephrology  PCCM  Procedures:    Significant Diagnostic Tests:  1/11 renal US >>Elevated resistive indices within the left lower quadrant transplant kidney.   No hydronephrosis. 1/16 CT chest > images personally reviewed> bilateral ground glass opacifications with pleural based nodule in left lower lobe, some interlobular pleural thickening 1/25 Echo> LVEF > 75%, mod LVH, RV systolic function normal, mitral valve normal, aortic valve is tricuspid 09/05/2020 CXR Progression of diffuse bilateral pulmonary opacities  Micro Data:  1/12 MRSA >> neg 1/10 BCx2 >> 1/11 UC >> klebsiella pna 2/14 blood >   Antimicrobials:  1/12 ceftriaxone >>1/18 1/28 vanc x1 1/28 cefepime > 1/29 2/14 cefepime >  2/14 vanc >   COVID: 1/10 remdesivir; 1/12;  1/11 dexamethasone >> 11/13 1/14 solumedrol > 1/23 1/14 prednisone 20mg  > 2/5 2/6 prednisone 5mg  > 2/14 1/16 baricitinib > 1/29 2/14 solumedrol >     Interim History / Subjective:  As above  Objective   Blood pressure 131/70, pulse (!) 116, temperature 99 F (37.2 C), temperature source Oral, resp. rate (!) 44, height 5\' 2"  (1.575 m), weight 83.3 kg, SpO2 (!) 89 %.    FiO2 (%):  [90 %-100 %] 100 %   Intake/Output Summary (Last 24 hours) at 09/23/2020 0834 Last data filed at 09/23/2020 0600 Gross per 24 hour  Intake 250 ml  Output 200 ml  Net 50 ml   Filed Weights   09/19/20 0442 09/20/20 0412 09/22/20 0534  Weight: 85.1 kg 85.1 kg 83.3 kg  Examination: General:  Tachypnea in chair HENT: NCAT OP clear PULM: Crackles bases bilaterally, normal effort CV: RRR, no mgr GI: BS+, soft, nontender MSK: normal bulk and tone Neuro: awake, alert, no distress, MAEW   Resolved Hospital Problem list     Assessment & Plan:  Acute respiratory failure with hypoxemia initially due to COVID 19 pneumonia, now with persistent hypoxemia of uncertain etiology.  Suspect that she has  COVID related fibrotic lung disease, but given her immunocommpromised status feel strongly that she has HCAP with the fever two days ago.  Opportunistic organisms should also be considered, but unfortunately she is too sick for a bronchoscopy Consider PE evaluation as well > continue vanc/cefepime, would not stop these based on procalcitonin and would treat for HCAP for at least 7 days (could drop vanc tomorrow if no fever) > HRCT > check d-dimer, if elevated the consider CT angiogram > continue solumedrol > cmv pcr blood > serum crypto ag > fungitell > aspergillus ag (could be false pos with cefepime) > hold antibiotic coverage as is, could broaden (PJP, antifungal coverage) if work up above revealing   Critical care time: n/a    Roselie Awkward, MD [place] P[facility]M Pager: [phone] Cell: [phone] If no response, call [phone]

## 2020-09-23 NOTE — Plan of Care (Signed)
  Problem: Education: Goal: Knowledge of risk factors and measures for prevention of condition will improve Outcome: Progressing   Problem: Coping: Goal: Psychosocial and spiritual needs will be supported Outcome: Progressing   Problem: Respiratory: Goal: Will maintain a patent airway Outcome: Progressing Goal: Complications related to the disease process, condition or treatment will be avoided or minimized Outcome: Progressing   Problem: Health Behavior/Discharge Planning: Goal: Ability to manage health-related needs will improve Outcome: Progressing   Problem: Clinical Measurements: Goal: Ability to maintain clinical measurements within normal limits will improve Outcome: Progressing Goal: Will remain free from infection Outcome: Progressing Goal: Diagnostic test results will improve Outcome: Progressing Goal: Respiratory complications will improve Outcome: Progressing Goal: Cardiovascular complication will be avoided Outcome: Progressing   Problem: Activity: Goal: Risk for activity intolerance will decrease Outcome: Progressing   Problem: Nutrition: Goal: Adequate nutrition will be maintained Outcome: Progressing   Problem: Coping: Goal: Level of anxiety will decrease Outcome: Progressing   Problem: Safety: Goal: Ability to remain free from injury will improve Outcome: Progressing   Problem: Skin Integrity: Goal: Risk for impaired skin integrity will decrease Outcome: Progressing   Problem: Education: Goal: Knowledge of disease and its progression will improve Outcome: Progressing Goal: Individualized Educational Video(s) Outcome: Progressing   Problem: Fluid Volume: Goal: Compliance with measures to maintain balanced fluid volume will improve Outcome: Progressing   Problem: Health Behavior/Discharge Planning: Goal: Ability to manage health-related needs will improve Outcome: Progressing   Problem: Nutritional: Goal: Ability to make healthy dietary  choices will improve Outcome: Progressing   Problem: Clinical Measurements: Goal: Complications related to the disease process, condition or treatment will be avoided or minimized Outcome: Progressing

## 2020-09-23 NOTE — Progress Notes (Signed)
Inpatient Diabetes Program Recommendations  AACE/ADA: New Consensus Statement on Inpatient Glycemic Control  Target Ranges:  Prepandial:   less than 140 mg/dL      Peak postprandial:   less than 180 mg/dL (1-2 hours)      Critically ill patients:  140 - 180 mg/dL   Results for [name] [name], [name] [name] (MRN [id number]) as of 09/23/2020 13:52  Ref. Range 09/22/2020 08:09 09/22/2020 11:39 09/22/2020 17:36 09/22/2020 20:27 09/23/2020 07:26 09/23/2020 11:18  Glucose-Capillary Latest Ref Range: 70 - 99 mg/dL 180 (H) 205 (H) 185 (H) 305 (H) 313 (H) 351 (H)   Review of Glycemic Control  Diabetes history: DM Outpatient Diabetes medications: Levemir 30 units QAM, Levemir 35 units QHS, Novolog 4 units TID with meals Current orders for Inpatient glycemic control: Levemir 10 units BID, Novolog 0-20 units TID with meals, Novolog 10 units TID with meals; Solumedrol 60 mg Q6H  Inpatient Diabetes Program Recommendations:    Insulin: If steroids are continued as ordered please consider increasing Levemir to 15 units BID.  Thanks, Barnie Alderman, RN, MSN, CDE Diabetes Coordinator Inpatient Diabetes Program [phone] (Team Pager from 8am to 5pm)

## 2020-09-23 NOTE — Progress Notes (Signed)
Physical Therapy Treatment Patient Details Name: [name] [name] MRN: [id number] DOB: Jan 22, 1956 Today's Date: 09/23/2020    History of Present Illness Pt is a 65 y.o. female who tested (+) COVID-19 on 08/13/20, now admitted 08/18/20 with worsening SOB, cough, chest pain and body aches. Acute hypoxic respiratory failure due to COVID-19 PNA, DKA, AKI on CKD, UTI. CP on 1/22 with NSTEMI . PMHx: HTN, DM2, chronic steroid use, s/p renal transplant x2.    PT Comments    Pt pleasant and very eager to get OOB today. Pt with SpO2 95% at rest on 40L at 100% HHFNC pt with desaturation to 87% with transition to EOB and face mask applied for remainder of session with pt desaturation to 82% at lowest and rebound within 2 min. Pt educated for breathing technique, pacing and activity.     Follow Up Recommendations  SNF;Supervision/Assistance - 24 hour     Equipment Recommendations  Rolling walker with 5" wheels;3in1 (PT);Hospital bed    Recommendations for Other Services       Precautions / Restrictions Precautions Precautions: Fall Precaution Comments: Watch SpO2 - quick to desaturate on HHFNC, use additional face mask with any mobility    Mobility  Bed Mobility Overal bed mobility: Needs Assistance Bed Mobility: Supine to Sit Rolling: Min guard   Supine to sit: Min assist;HOB elevated     General bed mobility comments: minguard to roll for pericare with desaturation to 88%, transition to sitting with HOB 25 degrees and HHA with SpO2 87% and face mask applied with rebound to 94%    Transfers Overall transfer level: Needs assistance   Transfers: Sit to/from [employer] Transfers Sit to Stand: Min assist Stand pivot transfers: Min assist       General transfer comment: pt able to stand from bed and pivot to chair then stand additional trial of 40 sec with bil UE support in standing. With pivot desaturaion to 82% with 2 min recovery. With standing also desaturation with 40  sec stand to 83% and 1 min seated recovery. Pt with HHFNC and face mask on for all transfers and standing  Ambulation/Gait             General Gait Details: not yet able   Stairs             Wheelchair Mobility    Modified Rankin (Stroke Patients Only)       Balance Overall balance assessment: Needs assistance Sitting-balance support: No upper extremity supported;Feet supported Sitting balance-Leahy Scale: Fair Sitting balance - Comments: EOB with and without UE support.   Standing balance support: Bilateral upper extremity supported Standing balance-Leahy Scale: Poor Standing balance comment: UE support for standing                            Cognition Arousal/Alertness: Awake/alert Behavior During Therapy: WFL for tasks assessed/performed Overall Cognitive Status: Impaired/Different from baseline Area of Impairment: Safety/judgement;Awareness                   Current Attention Level: Selective   Following Commands: Follows one step commands consistently Safety/Judgement: Decreased awareness of deficits   Problem Solving: Requires verbal cues General Comments: decreased awareness of true medical complexity and deficits      Exercises General Exercises - Lower Extremity Long Arc Quad: AROM;Both;Seated;15 reps Hip Flexion/Marching: AROM;Both;Seated;15 reps Toe Raises: AROM;Both;Seated;15 reps Heel Raises: AROM;Seated;Both;15 reps    General Comments  Pertinent Vitals/Pain Pain Score: 4  Pain Location: sacrum Pain Descriptors / Indicators: Discomfort;Aching;Sore Pain Intervention(s): Limited activity within patient's tolerance;Repositioned    Home Living                      Prior Function            PT Goals (current goals can now be found in the care plan section) Progress towards PT goals: Progressing toward goals    Frequency    Min 2X/week      PT Plan Current plan remains appropriate     Co-evaluation              AM-PAC PT "6 Clicks" Mobility   Outcome Measure  Help needed turning from your back to your side while in a flat bed without using bedrails?: None Help needed moving from lying on your back to sitting on the side of a flat bed without using bedrails?: A Little Help needed moving to and from a bed to a chair (including a wheelchair)?: A Little Help needed standing up from a chair using your arms (e.g., wheelchair or bedside chair)?: A Little Help needed to walk in hospital room?: A Lot Help needed climbing 3-5 steps with a railing? : Total 6 Click Score: 16    End of Session Equipment Utilized During Treatment: Oxygen Activity Tolerance: Patient tolerated treatment well Patient left: in chair;with call bell/phone within reach;with chair alarm set Nurse Communication: Mobility status;Precautions PT Visit Diagnosis: Other abnormalities of gait and mobility (R26.89);Muscle weakness (generalized) (M62.81)     Time: 4562-5638 PT Time Calculation (min) (ACUTE ONLY): 37 min  Charges:  $Therapeutic Exercise: 8-22 mins $Therapeutic Activity: 8-22 mins                     [name] [name] P, PT Acute Rehabilitation Services Pager: [phone] Office: [phone]    Sandy Salaam Rilyn Scroggs 09/23/2020, 9:43 AM

## 2020-09-24 ENCOUNTER — Inpatient Hospital Stay (HOSPITAL_COMMUNITY): Payer: HMO

## 2020-09-24 DIAGNOSIS — J189 Pneumonia, unspecified organism: Secondary | ICD-10-CM

## 2020-09-24 DIAGNOSIS — J9621 Acute and chronic respiratory failure with hypoxia: Secondary | ICD-10-CM | POA: Diagnosis not present

## 2020-09-24 DIAGNOSIS — D849 Immunodeficiency, unspecified: Secondary | ICD-10-CM | POA: Diagnosis not present

## 2020-09-24 DIAGNOSIS — E1121 Type 2 diabetes mellitus with diabetic nephropathy: Secondary | ICD-10-CM | POA: Diagnosis not present

## 2020-09-24 DIAGNOSIS — U071 COVID-19: Secondary | ICD-10-CM | POA: Diagnosis not present

## 2020-09-24 DIAGNOSIS — N179 Acute kidney failure, unspecified: Secondary | ICD-10-CM | POA: Diagnosis not present

## 2020-09-24 DIAGNOSIS — J9601 Acute respiratory failure with hypoxia: Secondary | ICD-10-CM | POA: Diagnosis not present

## 2020-09-24 LAB — BASIC METABOLIC PANEL
Anion gap: 9 (ref 5–15)
BUN: 29 mg/dL — ABNORMAL HIGH (ref 8–23)
CO2: 21 mmol/L — ABNORMAL LOW (ref 22–32)
Calcium: 10 mg/dL (ref 8.9–10.3)
Chloride: 105 mmol/L (ref 98–111)
Creatinine, Ser: 1.94 mg/dL — ABNORMAL HIGH (ref 0.44–1.00)
GFR, Estimated: 28 mL/min — ABNORMAL LOW (ref >60)
Glucose, Bld: 332 mg/dL — ABNORMAL HIGH (ref 70–99)
Potassium: 4.8 mmol/L (ref 3.5–5.1)
Sodium: 135 mmol/L (ref 135–145)

## 2020-09-24 LAB — GLUCOSE, CAPILLARY
Glucose-Capillary: 309 mg/dL — ABNORMAL HIGH (ref 70–99)
Glucose-Capillary: 359 mg/dL — ABNORMAL HIGH (ref 70–99)
Glucose-Capillary: 146 mg/dL — ABNORMAL HIGH (ref 70–99)
Glucose-Capillary: 207 mg/dL — ABNORMAL HIGH (ref 70–99)

## 2020-09-24 LAB — CBC
HCT: 31 % — ABNORMAL LOW (ref 36.0–46.0)
Hemoglobin: 10.1 g/dL — ABNORMAL LOW (ref 12.0–15.0)
MCH: 27.1 pg (ref 26.0–34.0)
MCHC: 32.6 g/dL (ref 30.0–36.0)
MCV: 83.1 fL (ref 80.0–100.0)
Platelets: 136 10*3/uL — ABNORMAL LOW (ref 150–400)
RBC: 3.73 MIL/uL — ABNORMAL LOW (ref 3.87–5.11)
RDW: 20 % — ABNORMAL HIGH (ref 11.5–15.5)
WBC: 5.6 10*3/uL (ref 4.0–10.5)
nRBC: 0 % (ref 0.0–0.2)

## 2020-09-24 LAB — PROCALCITONIN: Procalcitonin: 0.11 ng/mL

## 2020-09-24 LAB — MRSA PCR SCREENING: MRSA by PCR: NEGATIVE

## 2020-09-24 LAB — C-REACTIVE PROTEIN: CRP: 2.4 mg/dL — ABNORMAL HIGH (ref <1.0)

## 2020-09-24 MED ORDER — MYCOPHENOLATE SODIUM 180 MG PO TBEC: 360.0000 mg | DELAYED_RELEASE_TABLET | Freq: Two times a day (BID) | ORAL | Status: DC

## 2020-09-24 MED ORDER — INSULIN DETEMIR 100 UNIT/ML ~~LOC~~ SOLN
20.0000 [IU] | Freq: Two times a day (BID) | SUBCUTANEOUS | Status: DC
  Administered 2020-09-24 (×2): 20 [IU] via SUBCUTANEOUS
  Filled 2020-09-24 (×5): qty 0.2

## 2020-09-24 NOTE — Progress Notes (Signed)
NAME:  [name] [name], MRN:  [id number], DOB:  Jul 28, 1956, LOS: 86 ADMISSION DATE:  08/18/2020, CONSULTATION DATE:  1/10 REFERRING MD:  Hiram Comber, CHIEF COMPLAINT:  Dyspnea   Brief History:  65 y/o immunocompromised female admitted with COVID pneumonia on 1/20 after testing positive on 12/29. She was admitted to the ICU where she required heated high flow O2 while receiving treatment for COVID 19.  Her oxygenation improved but she has been difficult to discharge due to her COVID status and other complicating factors.  PCCM was consulted again on 1/28 when she developed worsening hypoxemic respiratory failure.  At that time her code status was made DNR and she was treated with broad spectrum antibiotics.  PCCM consulted again on 2/15 due to persistent hypoxemic respiratory failure.    History of Present Illness:  65 y/o immunocompromised female admitted with COVID pneumonia on 1/20 after testing positive on 12/29. She was admitted to the ICU where she required heated high flow O2 while receiving treatment for COVID 19.  Her oxygenation improved but she has been difficult to discharge due to her COVID status and other complicating factors.  PCCM was consulted again on 1/28 when she developed worsening hypoxemic respiratory failure.  At that time her code status was made DNR and she was treated with broad spectrum antibiotics.  PCCM consulted again on 2/15 due to persistent hypoxemic respiratory failure.   At baseline she has a history of renal transplant.  She has been treated with cellcept and prograf here in the hospital.  Today (2/15) she tells me that she has sinus congestion, dry cough.  She didn't feel febrile two days ago.  She denies leg swelling.    Past Medical History:  CKD, s/p renal transplant  Gout due to renal transplant medications Hyperlipidemia Hypertension Sickle cell trait Thyroid disease Type II diabetes UTI  Significant Hospital Events:  1/10 admitted  [facility] 1/11 ICU and CCM 1/14 To triad>> 5W  Consults:  Nephrology  PCCM  Procedures:    Significant Diagnostic Tests:  1/11 renal US >>Elevated resistive indices within the left lower quadrant transplant kidney.   No hydronephrosis. 1/16 CT chest > images personally reviewed> bilateral ground glass opacifications with pleural based nodule in left lower lobe, some interlobular pleural thickening 1/25 Echo> LVEF > 75%, mod LVH, RV systolic function normal, mitral valve normal, aortic valve is tricuspid 09/05/2020 CXR Progression of diffuse bilateral pulmonary opacities  Micro Data:  1/12 MRSA >> neg 1/10 BCx2 >> 1/11 UC >> klebsiella pna 2/14 blood >   Antimicrobials:  1/12 ceftriaxone >>1/18 1/28 vanc x1 1/28 cefepime > 1/29 2/14 cefepime >  2/14 vanc >   COVID: 1/10 remdesivir; 1/12;  1/11 dexamethasone >> 11/13 1/14 solumedrol > 1/23 1/14 prednisone 20mg  > 2/5 2/6 prednisone 5mg  > 2/14 1/16 baricitinib > 1/29 2/14 solumedrol >     Interim History / Subjective:  SOB better today, still coughing, especially first thing in the morning. Cough mostly dry, sometimes productive.  Objective   Blood pressure (!) 142/86, pulse 92, temperature 98.3 F (36.8 C), resp. rate 18, height 5\' 2"  (1.575 m), weight 83.3 kg, SpO2 99 %.    FiO2 (%):  [90 %-100 %] 90 %   Intake/Output Summary (Last 24 hours) at 09/24/2020 0717 Last data filed at 09/23/2020 2140 Gross per 24 hour  Intake 160 ml  Output --  Net 160 ml   Filed Weights   09/19/20 0442 09/20/20 0412 09/22/20 0534  Weight: 85.1  kg 85.1 kg 83.3 kg    Examination: General:  Chronically ill appearing woman sitting up in bed in NAD HENT: [place]/AT, eyes anicteric PULM: tachypneic without accessory muscle use, rhales L>R. Speaking in near complete sentences. CV: RRR, no murmurs GI: soft, NT MSK: no peripheral edema, no cyanosis Neuro: awake, alert, answering questions appropriately, moving all extremities but globally  weak   Resolved Hospital Problem list     Assessment & Plan:  Acute respiratory failure with hypoxemia initially due to COVID 19 pneumonia, now with persistent hypoxemia of uncertain etiology.  Suspect that she has COVID related fibrotic lung disease, but given her immunocommpromised, she probably has a superimposed bacterial HAP causing acute worsening.  Opportunistic organisms should also be considered, but unfortunately she is too sick for a bronchoscopy currently. Although D-dimer is minimally elevated, PE seems less likely. Negative leg [place] on 2/15. > Continue broad spectrum antibiotics to complete 7 days; ok to stop vanc today. Con't cefepime. > HRCT-- she tolerated 15 min on NRB this morning and can come off HHFNC to transport on NRB. D/w RT & RN > continue solumedrol > Holding mycophenolate 3 days to try to get better control of infection; d/w Nephro. Keep tacrolimus for now. > cmv pcr blood pending > serum crypto ag negative > fungitell pending > aspergillus ag not collected> d/w phlebotomy, working on getting this today > hold antibiotic coverage as is, could broaden (PJP, antifungal coverage) if work up above revealing. She has not been on her PJP prophylaxis during this admission. At this point would prefer not to start it if there is a possibility of acute PJP infection.   Critical care time:     Julian Hy, DO 09/24/20 7:17 AM  Pulmonary & Critical Care  From 7AM- 7PM if no response to pager, please call [phone]. After hours, 7PM- 7AM, please call Elink  [phone].

## 2020-09-24 NOTE — Progress Notes (Signed)
Inpatient Diabetes Program Recommendations  AACE/ADA: New Consensus Statement on Inpatient Glycemic Control   Target Ranges:  Prepandial:   less than 140 mg/dL      Peak postprandial:   less than 180 mg/dL (1-2 hours)      Critically ill patients:  140 - 180 mg/dL   Results for [name] [name], [name] [name] (MRN [id number]) as of 09/24/2020 11:00  Ref. Range 09/23/2020 07:26 09/23/2020 11:18 09/23/2020 16:22 09/23/2020 21:33 09/24/2020 07:27  Glucose-Capillary Latest Ref Range: 70 - 99 mg/dL 313 (H) 351 (H) 296 (H) 327 (H) 309 (H)   Review of Glycemic Control  Diabetes history:DM Outpatient Diabetes medications:Levemir 30 units QAM, Levemir 35 units QHS, Novolog 4 units TID with meals Current orders for Inpatient glycemic control:Levemir 20 units BID, Novolog 0-20 units TID with meals; Solumedrol 60 mg Q6H  Inpatient Diabetes Program Recommendations:    Insulin: Noted Levemir increased to 20 units BID today.  If steroids are continued as ordered please consider ordering Novolog 6 units TID with meals for meal coverage if patient eats at least 50% of meals. Also, please add Novolog 0-5 units QHS for bedtime correction.  Thanks, Barnie Alderman, RN, MSN, CDE Diabetes Coordinator Inpatient Diabetes Program [phone] (Team Pager from 8am to 5pm)

## 2020-09-24 NOTE — Progress Notes (Signed)
Pt needs CT. O2 not stable at this time. RT is agreeable and recommended not taking Pt on nonrebreather due to her O2 requirements. Waiting on On call response.

## 2020-09-24 NOTE — Progress Notes (Addendum)
[facility] PROGRESS NOTE  Marketa Midkiff RJJ:[id number] DOB: [birth year] DOA: 08/18/2020 PCP: Glendale Chard, MD  Status: Remains inpatient appropriate because:Unsafe d/c plan, IV treatments appropriate due to intensity of illness or inability to take PO and Inpatient level of care appropriate due to severity of illness   Dispo:  Patient From: Home  Planned Disposition: [place]  Expected discharge date: 10/03/2020  Medically stable for discharge: No  Difficult to place: Yes; Barriers to DC: Continues to require very high levels of oxygen secondary to Covid lung injury.  She is physically deconditioned and requires SNF for rehabilitative therapies but given high level of oxygen requirements not a candidate for SNF at this time.  2/15 patient referred to [facility] and they will be evaluating her chart   Level of care: Progressive  Code Status: DNR  Family Communication: Patient; on 2/15 Mendel Ryder about change in status, change in medications, pulmonary consultation and impending imaging to better characterize underlying Covid lung injury DVT prophylaxis: Lovenox Vaccination status: Fully vaccinated against Covid prior to admission.  Initial vaccine November 2021 with follow-up vaccines March and April 2021  Foley catheter: No  HPI: 34 old female with history of renal transplant x2 on chronic immunosuppression agents, diabetes, hypothyroidism who presented with shortness of breath initially.  She was found to be in acute hypoxic respiratory failure due to Covid pneumonia.  Hospital course was prolonged and was complicated by slow improvement from severe hypoxia, AKI and non-STEMI.  Continues to require high flow oxygen.  Patient was a DNR until 2/10 at which point she changed back to full code. Further discussions held w/ pt and dtr and code status changed back to DNR on 2/12   Subjective: Awake and alert.  Reporting some increased shortness of breath.  Noted  oxygen tubing twisted and crimped.  Tubing straightened and patient reported less shortness of breath.  No other complaints reported had questions regarding how she was able to catch Covid despite being fully vaccinated plus booster.  Explanation given and she verbalized understanding.  Objective: Vitals:   09/24/20 0349 09/24/20 0727  BP:  136/73  Pulse: 92 98  Resp: 18 16  Temp:  98.3 F (36.8 C)  SpO2: 99% 97%    Intake/Output Summary (Last 24 hours) at 09/24/2020 0746 Last data filed at 09/23/2020 2140 Gross per 24 hour  Intake 160 ml  Output --  Net 160 ml   Filed Weights   09/19/20 0442 09/20/20 0412 09/22/20 0534  Weight: 85.1 kg 85.1 kg 83.3 kg    Exam:  Constitutional: Alert, calm, mild distress that improved after adjusting oxygen tubing Respiratory: Clear and slightly coarse to auscultation and decreased in the bases, subtle increased work of breathing that improved after adjustment in crimped oxygen tubing.  HHFNC the 35 L / 90% FiO2 with O2 sats is 97 to 99% Cardiovascular: S1-S2, mild tachycardia at rest, no peripheral edema Abdomen: Soft and nontender.  Normoactive bowel sounds.  LBM 2/15 Neurologic: CN 2-12 grossly intact. Sensation intact, DTR normal. Strength 3-4/5 x all 4 extremities.  Psychiatric: Normal judgment and insight.  Awakens easily and oriented x 3.     Assessment/Plan: Acute problems: Sepsis /acute on chronic hypoxic respiratory failure due to Covid pneumonia/new hospital associated pneumonia as of 2/14:  Fully vaccinated patient.  Immunocompromised status due to renal transplant, immunosuppressive medications.   Initially presented with chest pain, shortness of breath.  Found to have multifocal pneumonia from Covid.  Completed treatment with IV steroids,  baricitinib, remdesivir.  Also was treated with IV antibiotics for superimposed bacterial pneumonia.   Despite aggressive measures, she remains severely hypoxic.  Several discussion was held with  family/patient about goals of care-DNR Continue pulmonary toileting, incentive spirometer.   Significant difficulty mobilizing given any amount of activity precipitate severe increased work of breathing and O2 sats decreased into the 70s Continue scheduled Ativan every 12 hours and IV Dilaudid 0.5 mg every 4 hours prn for air hunger and associated anxiety Continue Cefepime-2/16 DC vancomycin-blood cxs pending Started Solumedrol 60 mg IV q 6hrs CRP trend: 5.2> 6.3 > 2.4 LDH: 253 2/15 PCCM consulted.  See note for details but in short recommendation is to continue antibiotics despite normal procalcitonin due to concerns of healthcare associated pneumonia.  Patient too sick/stressed to tolerate bronchoscopy.  Concern is for possible opportunistic organisms as well as possible PE.   Per PCCM:  High-resolution CT chest (later dc'd and changed to CT w/o contrast) -Per PCCM note patient was able to tolerate 100% NRB mask so will be able to transport for CT scan D-dimer slightly elevated so lower extremity venous duplex w/o DVT Continue Solu-Medrol CMV PCR pending, serum crypto antigen neg, Fungitell and Aspergillus antigen pending Since admission patient had not been on pneumocystis jirovecii prophylaxis but at this juncture PCCM not initiating treatment if there is the possibility of an acute infectious state from this organism  AKI on CKD stage IV/transplant kidney: T ransplant x2.  AKI was most likely hemodynamically mediated.   Creatinine has remained acutely around 1.7.   Nephrology has signed off  Continue Prograf, holding Myfortic of acute infection/CM discussed with nephrology prior to holding.  Prednisone on hold in favor of IV Solu-Medrol Avoid nephrotoxic medication Previous hyperkalemia resolved  DKA/diabetes mellitus on insulin with hyperglycemia DKA resolved.  No longer on insulin drip.   2/16 Increased hyperglycemia w/ steroids so will increase Levemir to 20 units and continue SSI  to resistent Hemoglobin A1c of 8.2 as per 1/11.   Likely secondary to chronic prednisone requirement Follow CBG and provide SSI  Sinus pause: Happened on  1/20 with 4.5 seconds pause.   Beta-blocker discontinued.  Currently stable.  NSTEMI:  Patient with chest pain on 1/23 with EKG changes.   Treated with Nitroglycerin, morphine and beta-blocker.   Echo is reassuring with preserved ejection fraction.   Continue aspirin/statin completed 48 hours of IV heparin.   Cardiology signed off.  Chronic normocytic anemia:  Most likely associated  with CKD.  No signs of bleeding.   Status post 1 unit of PRBC transfusion on 09/11/2020.   Continue monitoring hemoglobin intermittently  Deconditioning/debility:  PT/OT recommended skilled nursing facility on discharge but current O2 requirements are too high to facilitate discharge to skilled nursing facility.  Stage I decubitus coccyx Pressure Injury 08/20/20 Coccyx Mid Stage 1 -  Intact skin with non-blanchable redness of a localized area usually over a bony prominence. (Active)  Date First Assessed/Time First Assessed: 08/20/20 0800   Location: Coccyx  Location Orientation: Mid  Staging: Stage 1 -  Intact skin with non-blanchable redness of a localized area usually over a bony prominence.  Present on Admission: Yes    Assessments 08/20/2020  9:00 AM 09/18/2020  7:40 PM  Dressing Type Foam - Lift dressing to assess site every shift None  Dressing Changed --  Dressing Change Frequency Every 3 days --  State of Healing -- Other (Comment)  Site / Wound Assessment Clean;Dry;Pink Clean  Peri-wound Assessment -- Intact  Wound Length (cm) 5 cm --  Wound Width (cm) 0.1 cm --  Wound Depth (cm) 0 cm --  Wound Surface Area (cm^2) 0.5 cm^2 --  Wound Volume (cm^3) 0 cm^3 --  Drainage Amount None None  Treatment Cleansed;Off loading --     No Linked orders to display     Wound / Incision (Open or Dehisced) 08/31/20 Puncture Abdomen Left;Lower From  Heparin SQ injections. (Active)  Date First Assessed/Time First Assessed: 08/31/20 2000   Wound Type: Puncture  Location: Abdomen  Location Orientation: Left;Lower  Wound Description (Comments): From Heparin SQ injections.  Present on Admission: No    Assessments 08/31/2020  8:00 PM 09/22/2020  7:00 AM  Dressing Type -- None  Dressing Status -- Clean;Dry;Intact  Site / Wound Assessment -- Purple  Peri-wound Assessment -- Purple  Wound Length (cm) 0 cm --  Wound Width (cm) 0 cm --  Wound Depth (cm) 0 cm --  Wound Volume (cm^3) 0 cm^3 --  Wound Surface Area (cm^2) 0 cm^2 --  Drainage Amount -- None     No Linked orders to display       Other problems: Transaminitis:  Resolved likely secondary to COVID meds as well as hypoperfusion  Right upper quadrant ultrasound is nonacute and showed some sludge/gallstones but no signs of cholecystitis.   Hepatitis panel negative.    Thrombocytopenia:  Most likely associated with acute illness.  Continue to monitor,stable  Complicated UTI:  Completed course of Rocephin.  Hypothyroidism:  Continue Synthyroid.   Repeat TFT in 3 months  Hypertension:  Currently blood pressure stable.  Continue medications  Hyperlipidemia:  Statin held due to elevated LFTs.   LFTs have normalized so can consider resumption of statin although oral intake remains variable  Obesity:  BMI 33.9  Nutrition Status: Estimated body mass index is 33.59 kg/m as calculated from the following:   Height as of this encounter: 5\' 2"  (1.575 m).   Weight as of this encounter: 83.3 kg.  Poor oral intake 2/2 air hunger          Data Reviewed: Basic Metabolic Panel: Recent Labs  Lab 09/18/20 0050 09/19/20 0229 09/20/20 0354 09/21/20 0235 09/24/20 0423  NA 139 138 139 137 135  K 4.2 4.0 4.3 4.3 4.8  CL 109 109 106 105 105  CO2 22 21* 23 22 21*  GLUCOSE 56* 140* 144* 133* 332*  BUN 21 18 20 22  29*  CREATININE 1.66* 1.87* 2.09* 2.18* 1.94*  CALCIUM  9.0 9.3 9.5 9.1 10.0  PHOS 1.8* 1.8* 2.4* 2.8  --    Liver Function Tests: Recent Labs  Lab 09/18/20 0050 09/19/20 0229 09/20/20 0354 09/21/20 0235  AST 15  --   --   --   ALT 33  --   --   --   ALKPHOS 128*  --   --   --   BILITOT 1.2  --   --   --   PROT 6.0*  --   --   --   ALBUMIN 3.0*  3.0* 2.9* 3.1* 2.8*   No results for input(s): LIPASE, AMYLASE in the last 168 hours. No results for input(s): AMMONIA in the last 168 hours. CBC: Recent Labs  Lab 09/18/20 0050 09/21/20 0235 09/24/20 0423  WBC 3.2* 2.9* 5.6  HGB 9.4* 9.1* 10.1*  HCT 29.2* 28.5* 31.0*  MCV 84.9 84.8 83.1  PLT 125* 116* 136*   Cardiac Enzymes: Recent Labs  Lab 09/22/20 1051  CKTOTAL 15*  BNP (last 3 results) Recent Labs    09/11/20 0155 09/12/20 0113 09/15/20 0028  BNP 86.8 80.1 80.0    ProBNP (last 3 results) No results for input(s): PROBNP in the last 8760 hours.  CBG: Recent Labs  Lab 09/23/20 0726 09/23/20 1118 09/23/20 1622 09/23/20 2133 09/24/20 0727  GLUCAP 313* 351* 296* 327* 309*    Recent Results (from the past 240 hour(s))  Culture, blood (routine x 2)     Status: None (Preliminary result)   Collection Time: 09/22/20  8:26 AM   Specimen: BLOOD LEFT HAND  Result Value Ref Range Status   Specimen Description BLOOD LEFT HAND  Final   Special Requests   Final    BOTTLES DRAWN AEROBIC ONLY Blood Culture adequate volume   Culture   Final    NO GROWTH < 12 HOURS Performed at [facility], [place] [address], [place], [place] [address]    Report Status PENDING  Incomplete  Culture, blood (routine x 2)     Status: None (Preliminary result)   Collection Time: 09/22/20  8:27 AM   Specimen: BLOOD LEFT ARM  Result Value Ref Range Status   Specimen Description BLOOD LEFT ARM  Final   Special Requests   Final    BOTTLES DRAWN AEROBIC ONLY Blood Culture adequate volume   Culture   Final    NO GROWTH < 12 HOURS Performed at [facility], [place] [address],  [place], [place] [address]    Report Status PENDING  Incomplete     Studies: DG CHEST PORT 1 VIEW  Result Date: 09/22/2020 CLINICAL DATA:  Hypoxia. EXAM: PORTABLE CHEST 1 VIEW COMPARISON:  September 12, 2020. FINDINGS: Stable cardiomegaly. No pneumothorax or significant pleural effusion is noted. Stable bilateral diffuse patchy airspace opacities are noted consistent with multifocal pneumonia. Bony thorax is unremarkable. IMPRESSION: Stable bilateral diffuse patchy airspace opacities consistent with multifocal pneumonia. Electronically Signed   By: Marijo Conception M.D.   On: 09/22/2020 08:11   VAS [place] LOWER EXTREMITY VENOUS (DVT)  Result Date: 09/23/2020  Lower Venous DVT Study Indications: D-Dimer.  Risk Factors: Immobility. Comparison Study: No previous Performing Technologist: Vonzell Schlatter RVT  Examination Guidelines: A complete evaluation includes B-mode imaging, spectral Doppler, color Doppler, and power Doppler as needed of all accessible portions of each vessel. Bilateral testing is considered an integral part of a complete examination. Limited examinations for reoccurring indications may be performed as noted. The reflux portion of the exam is performed with the patient in reverse Trendelenburg.  +---------+---------------+---------+-----------+----------+--------------+ RIGHT    CompressibilityPhasicitySpontaneityPropertiesThrombus Aging +---------+---------------+---------+-----------+----------+--------------+ CFV      Full           Yes      Yes                                 +---------+---------------+---------+-----------+----------+--------------+ SFJ      Full                                                        +---------+---------------+---------+-----------+----------+--------------+ FV Prox  Full                                                        +---------+---------------+---------+-----------+----------+--------------+  FV Mid   Full                                                         +---------+---------------+---------+-----------+----------+--------------+ FV DistalFull                                                        +---------+---------------+---------+-----------+----------+--------------+ PFV      Full                                                        +---------+---------------+---------+-----------+----------+--------------+ POP      Full           Yes      Yes                                 +---------+---------------+---------+-----------+----------+--------------+ PTV      Full                                                        +---------+---------------+---------+-----------+----------+--------------+ PERO     Full                                                        +---------+---------------+---------+-----------+----------+--------------+   +---------+---------------+---------+-----------+----------+--------------+ LEFT     CompressibilityPhasicitySpontaneityPropertiesThrombus Aging +---------+---------------+---------+-----------+----------+--------------+ CFV      Full           Yes      Yes                                 +---------+---------------+---------+-----------+----------+--------------+ SFJ      Full                                                        +---------+---------------+---------+-----------+----------+--------------+ FV Prox  Full                                                        +---------+---------------+---------+-----------+----------+--------------+ FV Mid   Full                                                        +---------+---------------+---------+-----------+----------+--------------+  FV DistalFull                                                        +---------+---------------+---------+-----------+----------+--------------+ PFV      Full                                                         +---------+---------------+---------+-----------+----------+--------------+ POP      Full           Yes      Yes                                 +---------+---------------+---------+-----------+----------+--------------+ PTV      Full                                                        +---------+---------------+---------+-----------+----------+--------------+ PERO     Full                                                        +---------+---------------+---------+-----------+----------+--------------+  Summary: BILATERAL: - No evidence of deep vein thrombosis seen in the lower extremities, bilaterally. -No evidence of popliteal cyst, bilaterally.   *See table(s) above for measurements and observations.    Preliminary     Scheduled Meds: . amLODipine  10 mg Oral Daily  . vitamin C  500 mg Oral Daily  . aspirin EC  81 mg Oral Daily  . darbepoetin (ARANESP) injection - NON-DIALYSIS  150 mcg Subcutaneous Q Tue-1800  . enoxaparin (LOVENOX) injection  30 mg Subcutaneous Q24H  . feeding supplement (NEPRO CARB STEADY)  237 mL Oral BID BM  . fluticasone  1 spray Each Nare Daily  . folic acid  1 mg Oral Daily  . hydrALAZINE  25 mg Oral Q8H  . insulin aspart  0-20 Units Subcutaneous TID WC  . insulin detemir  20 Units Subcutaneous BID  . isosorbide mononitrate  60 mg Oral Daily  . levothyroxine  50 mcg Oral Q0600  . LORazepam  1 mg Oral BID  . mouth rinse  15 mL Mouth Rinse BID  . methylPREDNISolone (SOLU-MEDROL) injection  60 mg Intravenous Q6H  . mycophenolate  360 mg Oral BID  . pantoprazole  40 mg Oral BID  . tacrolimus  3 mg Oral QPM  . tacrolimus  4 mg Oral Daily  . zinc sulfate  220 mg Oral Daily   Continuous Infusions: . sodium chloride    . ceFEPime (MAXIPIME) IV Stopped (09/23/20 1557)  . sodium chloride Stopped (08/31/20 1545)  . vancomycin      Principal Problem:   ARF (acute renal failure) (HCC) Active Problems:   AKI (acute kidney injury) ([place])    Fever, unspecified   Renal transplant recipient   Immunocompromised (  [place])   DM (diabetes mellitus), type 2 with renal complications ([place])   Essential hypertension with goal blood pressure less than 140/90   Acute hypoxemic respiratory failure due to COVID-19 ([place])   Respiratory failure (HCC)   Acute hyperkalemia   Diabetic ketoacidosis without coma associated with type 1 diabetes mellitus ([place])   Pressure injury of skin   Acute and chronic respiratory failure with hypoxia ([place])   HCAP (healthcare-associated pneumonia)   Consultants:  None  Procedures:  Echocardiogram  Antibiotics: Anti-infectives (From admission, onward)   Start     Dose/Rate Route Frequency Ordered Stop   09/24/20 1200  vancomycin (VANCOREADY) IVPB 1000 mg/200 mL        1,000 mg 200 mL/hr over 60 Minutes Intravenous Every 48 hours 09/22/20 1036     09/22/20 1130  vancomycin (VANCOREADY) IVPB 1750 mg/350 mL        1,750 mg 175 mL/hr over 120 Minutes Intravenous  Once 09/22/20 1036 09/22/20 1735   09/22/20 1130  ceFEPIme (MAXIPIME) 2 g in sodium chloride 0.9 % 100 mL IVPB        2 g 200 mL/hr over 30 Minutes Intravenous Every 24 hours 09/22/20 1036     09/07/20 1030  vancomycin (VANCOCIN) IVPB 1000 mg/200 mL premix  Status:  Discontinued        1,000 mg 200 mL/hr over 60 Minutes Intravenous Every 48 hours 09/05/20 0933 09/07/20 0951   09/05/20 1030  vancomycin (VANCOREADY) IVPB 2000 mg/400 mL        2,000 mg 200 mL/hr over 120 Minutes Intravenous  Once 09/05/20 0933 09/05/20 2018   09/05/20 1030  ceFEPIme (MAXIPIME) 2 g in sodium chloride 0.9 % 100 mL IVPB  Status:  Discontinued        2 g 200 mL/hr over 30 Minutes Intravenous Every 24 hours 09/05/20 0933 09/07/20 0951   08/20/20 1445  cefTRIAXone (ROCEPHIN) 1 g in sodium chloride 0.9 % 100 mL IVPB        1 g 200 mL/hr over 30 Minutes Intravenous Every 24 hours 08/20/20 1348 08/26/20 2147   08/20/20 1000  remdesivir 100 mg in sodium chloride 0.9 % 100 mL  IVPB       "Followed by" Linked Group Details   100 mg 200 mL/hr over 30 Minutes Intravenous Daily 08/19/20 0021 08/23/20 1113   08/19/20 0130  remdesivir 200 mg in sodium chloride 0.9% 250 mL IVPB       "Followed by" Linked Group Details   200 mg 580 mL/hr over 30 Minutes Intravenous Once 08/19/20 0021 08/19/20 0314       Time spent: 30 minutes    Erin Hearing ANP  [facility] 7 am - 330 pm/M-F for direct patient care and secure chat Please refer to Amion for contact info 36  days

## 2020-09-24 NOTE — Progress Notes (Signed)
Occupational Therapy Treatment Patient Details Name: [name] [name] MRN: [id number] DOB: 11-01-1955 Today's Date: 09/24/2020    History of present illness Pt is a 65 y.o. female who tested (+) COVID-19 on 08/13/20, now admitted 08/18/20 with worsening SOB, cough, chest pain and body aches. Acute hypoxic respiratory failure due to COVID-19 PNA, DKA, AKI on CKD, UTI. CP on 1/22 with NSTEMI . PMHx: HTN, DM2, chronic steroid use, s/p renal transplant x2.   OT comments  Pt with slow progress towards OT goals but remains motivated to participate in therapy. Pt with goal of being able to recover without use of NRB mask - which she was able to do with initial desat sitting EOB. However, with short standing at bedside for linen change, pt with difficulty recovering with other strategies and opted to use NRB mask. Session focused on education of energy conservation strategies, AROM of UE with focus on breathing techniques. Collaborated with pt on calming strategies with pt able to improve slowed breathing with eyes closed and quiet environment. May be appropriate to trial calming music during ADLs during next session, as well as assess carryover of UE HEP.   Pt received on 35 L HHFNC, 90% FiO2 and 90% at rest. Desats to 83% sitting EOB and then 81% standing at bedside. Requires > 5 minutes of pursed lip breathing to recover 85-86%. Recovers quickly to 92% with addition of NRB mask.    Follow Up Recommendations  SNF;LTACH;Supervision/Assistance - 24 hour    Equipment Recommendations  3 in 1 bedside commode;Other (comment) (RW)    Recommendations for Other Services      Precautions / Restrictions Precautions Precautions: Fall Precaution Comments: Watch SpO2 - quick to desaturate on HHFNC, use additional face mask with any mobility Restrictions Weight Bearing Restrictions: No       Mobility Bed Mobility Overal bed mobility: Needs Assistance Bed Mobility: Supine to Sit;Sit to Supine      Supine to sit: Supervision;HOB elevated Sit to supine: Min assist   General bed mobility comments: Increased time to sit EOB, Min A for B LE back to bed  Transfers Overall transfer level: Needs assistance Equipment used: Rolling walker (2 wheeled) Transfers: Sit to/from Stand Sit to Stand: Min guard         General transfer comment: min guard for sit to stand while RN and OT changed linens due to purewick malfunction, able to stand > 20 seconds    Balance Overall balance assessment: Needs assistance Sitting-balance support: No upper extremity supported;Feet supported Sitting balance-Leahy Scale: Fair     Standing balance support: Bilateral upper extremity supported Standing balance-Leahy Scale: Poor Standing balance comment: UE support for standing                           ADL either performed or assessed with clinical judgement   ADL Overall ADL's : Needs assistance/impaired                                       General ADL Comments: Educated on energy conservation strategies during ADLs with handout provided. Focused on calming, breathing strategies and encouraged music (pt likes jazz/classical for relaxation). Pt with improved slowed breathing with eyes closed and quiet environment today     Vision   Vision Assessment?: No apparent visual deficits   Perception     Praxis  Cognition Arousal/Alertness: Awake/alert Behavior During Therapy: WFL for tasks assessed/performed Overall Cognitive Status: Impaired/Different from baseline Area of Impairment: Safety/judgement;Awareness                         Safety/Judgement: Decreased awareness of deficits Awareness: Emergent   General Comments: decreased awareness of true medical complexity and deficits        Exercises     Shoulder Instructions       General Comments Coordinated with NP prior to treating pt due to pending CT scan of chest though low likelihood of PE  noted - OK-ed for OT session. Received on 35 L HHFNC, 90% FiO2. Pt desats to 83% sitting EOB, 81% with standing. Pt required > 5 min to recover to 85%. required NRB to fully recover to 90%. Pt reports she would like to be able to recover without NRB mask if possible. Provided AROM UE HEP education with elbows/shoulders and focus on breathing strategies.    Pertinent Vitals/ Pain       Pain Assessment: Faces Faces Pain Scale: Hurts a little bit Pain Location: sacrum Pain Descriptors / Indicators: Sore Pain Intervention(s): Repositioned  Home Living                                          Prior Functioning/Environment              Frequency  Min 2X/week        Progress Toward Goals  OT Goals(current goals can now be found in the care plan section)  Progress towards OT goals: Progressing toward goals  Acute Rehab OT Goals Patient Stated Goal: i want to be able to recover without NRB mask. OT Goal Formulation: With patient Time For Goal Achievement: 10/08/20 Potential to Achieve Goals: Good ADL Goals Pt Will Perform Grooming: with set-up;sitting Pt Will Perform Lower Body Bathing: with min assist;sit to/from stand Pt Will Perform Upper Body Dressing: with min assist;sitting Pt Will Perform Lower Body Dressing: with mod assist;sit to/from stand Pt Will Transfer to Toilet: with min guard assist;stand pivot transfer;bedside commode Pt Will Perform Toileting - Clothing Manipulation and hygiene: with min assist;sit to/from stand Pt/caregiver will Perform Home Exercise Program: Increased strength;Both right and left upper extremity;With written HEP provided;Independently;With theraband Additional ADL Goal #1: Pt will independently demonstrate carryover of energy conservation techniques during ADL/functional task.  Plan Discharge plan needs to be updated    Co-evaluation                 AM-PAC OT "6 Clicks" Daily Activity     Outcome Measure   Help  from another person eating meals?: None Help from another person taking care of personal grooming?: A Little Help from another person toileting, which includes using toliet, bedpan, or urinal?: A Lot Help from another person bathing (including washing, rinsing, drying)?: A Lot Help from another person to put on and taking off regular upper body clothing?: A Little Help from another person to put on and taking off regular lower body clothing?: A Lot 6 Click Score: 16    End of Session Equipment Utilized During Treatment: Rolling walker;Oxygen  OT Visit Diagnosis: Unsteadiness on feet (R26.81)   Activity Tolerance Patient limited by fatigue   Patient Left in bed;with call bell/phone within reach;with bed alarm set   Nurse Communication Mobility status;Other (comment) (O2)  Time: 7618-4859 OT Time Calculation (min): 37 min  Charges: OT General Charges $OT Visit: 1 Visit OT Treatments $Self Care/Home Management : 8-22 mins $Therapeutic Activity: 8-22 mins  Malachy Chamber, OTR/L Acute Rehab Services Office: [phone]   Layla Maw 09/24/2020, 3:17 PM

## 2020-09-25 DIAGNOSIS — E1121 Type 2 diabetes mellitus with diabetic nephropathy: Secondary | ICD-10-CM | POA: Diagnosis not present

## 2020-09-25 DIAGNOSIS — J9621 Acute and chronic respiratory failure with hypoxia: Secondary | ICD-10-CM | POA: Diagnosis not present

## 2020-09-25 DIAGNOSIS — D849 Immunodeficiency, unspecified: Secondary | ICD-10-CM | POA: Diagnosis not present

## 2020-09-25 DIAGNOSIS — U071 COVID-19: Secondary | ICD-10-CM | POA: Diagnosis not present

## 2020-09-25 DIAGNOSIS — J189 Pneumonia, unspecified organism: Secondary | ICD-10-CM | POA: Diagnosis not present

## 2020-09-25 DIAGNOSIS — J8410 Pulmonary fibrosis, unspecified: Secondary | ICD-10-CM

## 2020-09-25 LAB — GLUCOSE, CAPILLARY
Glucose-Capillary: 223 mg/dL — ABNORMAL HIGH (ref 70–99)
Glucose-Capillary: 233 mg/dL — ABNORMAL HIGH (ref 70–99)
Glucose-Capillary: 236 mg/dL — ABNORMAL HIGH (ref 70–99)
Glucose-Capillary: 213 mg/dL — ABNORMAL HIGH (ref 70–99)

## 2020-09-25 LAB — CMV DNA, QUANTITATIVE, PCR
CMV DNA Quant: 546 IU/mL
Log10 CMV Qn DNA Pl: 2.737 log10 IU/mL

## 2020-09-25 MED ORDER — MYCOPHENOLATE SODIUM 180 MG PO TBEC
360.0000 mg | DELAYED_RELEASE_TABLET | Freq: Two times a day (BID) | ORAL | Status: DC
  Administered 2020-09-25 – 2020-10-13 (×36): 360 mg via ORAL
  Filled 2020-09-25 (×39): qty 2

## 2020-09-25 MED ORDER — INSULIN DETEMIR 100 UNIT/ML ~~LOC~~ SOLN
30.0000 [IU] | Freq: Two times a day (BID) | SUBCUTANEOUS | Status: DC
  Administered 2020-09-25 – 2020-09-29 (×9): 30 [IU] via SUBCUTANEOUS
  Filled 2020-09-25 (×10): qty 0.3

## 2020-09-25 NOTE — Progress Notes (Addendum)
[facility] PROGRESS NOTE  [name] [name] JKD:[id number] DOB: Aug 10, 1955 DOA: 08/18/2020 PCP: Glendale Chard, MD  Status: Remains inpatient appropriate because:Unsafe d/c plan, IV treatments appropriate due to intensity of illness or inability to take PO and Inpatient level of care appropriate due to severity of illness   Dispo:  Patient From: Home  Planned Disposition: [employer]  Expected discharge date: 10/03/2020  Medically stable for discharge: No  Difficult to place: Yes; Barriers to DC: Continues to require very high levels of oxygen secondary to Covid lung injury.  She is physically deconditioned and requires SNF for rehabilitative therapies but given high level of oxygen requirements not a candidate for SNF at this time.  2/15 patient referred to [facility] and they will be evaluating her chart   Level of care: Progressive  Code Status: DNR  Family Communication: Patient; daughter [name] [name] 2/17.  Updated on recent CT results DVT prophylaxis: Lovenox Vaccination status: Fully vaccinated against Covid prior to admission.  Initial vaccine November 2021 with follow-up vaccines March and April 2021  Foley catheter: No  HPI: 40 old female with history of renal transplant x2 on chronic immunosuppression agents, diabetes, hypothyroidism who presented with shortness of breath initially.  She was found to be in acute hypoxic respiratory failure due to Covid pneumonia.  Hospital course was prolonged and was complicated by slow improvement from severe hypoxia, AKI and non-STEMI.  Continues to require high flow oxygen.  Patient was a DNR until 2/10 at which point she changed back to full code. Further discussions held w/ pt and dtr and code status changed back to DNR on 2/12   Subjective: Alert and sitting up in chair eating breakfast with minimal increased work of breathing.  Smiling and laughing.  Daughter at bedside.  Objective: Vitals:   09/25/20 0200  09/25/20 0439  BP:  (!) 144/83  Pulse: 83 96  Resp: (!) 26 18  Temp:  97.9 F (36.6 C)  SpO2: 96% 96%    Intake/Output Summary (Last 24 hours) at 09/25/2020 0730 Last data filed at 09/25/2020 0500 Gross per 24 hour  Intake 340 ml  Output 1100 ml  Net -760 ml   Filed Weights   09/20/20 0412 09/22/20 0534 09/25/20 0458  Weight: 85.1 kg 83.3 kg 86.8 kg    Exam:  Constitutional: Awake alert, essentially no acute distress, smiling Respiratory: Bilateral lung sounds coarse but overall clear to auscultation somewhat diminished in the bases posteriorly, minimal work of breathing noted.  HHFNC the 30 L / 85% FiO2 with O2 sats is 93 to 96% Cardiovascular: Normal heart sounds, some resting tachycardia, pulses regular overall, no peripheral edema. Abdomen: Soft.  Eating oral diet.  Normoactive bowel sounds.  LBM 2/17 Neurologic: CN 2-12 grossly intact. Sensation intact, DTR normal. Strength 3-4/5 x all 4 extremities.  Psychiatric:   Alert and oriented x3, pleasant affect   Assessment/Plan: Acute problems: Sepsis /acute on chronic hypoxic respiratory failure due to Covid pneumonia/new hospital associated pneumonia as of 2/14:  Fully vaccinated patient.  Immunocompromised status due to renal transplant, immunosuppressive medications.   Presented with multifocal pneumonia from Covid.  Completed treatment with IV steroids, baricitinib, remdesivir.  Also was treated with IV antibiotics for superimposed bacterial pneumonia.   Despite aggressive measures, she remains severely hypoxic likely secondary to irreversible pulmonary fibrosis secondary to Covid pneumonitis based on recent CT of the chest.   Continue pulmonary toileting, incentive spirometer.   Continue scheduled Ativan every 12 hours and IV Dilaudid 0.5 mg  every 4 hours prn for air hunger and associated anxiety Continue Cefepime for a total of 7 days noting clinical improvement with addition of steroids and antibiotics i.e. decreased O2  requirement -2/16 DC'd vancomycin-blood cxs no growth and subsequent infectious work-up serology negative Continue Solumedrol 60 mg IV q 6hrs CRP trend: 5.2> 6.3 > 2.4 with an LDH of 253 2/15 PCCM consulted. Patient too sick/stressed to tolerate bronchoscopy.    CT Chest 2/16: Spectrum of findings compatible with evolving severe postinflammatory fibrosis due to history of COVID-19 pneumonia. Diffuse thickening of the peribronchovascular interstitium with associated moderate traction bronchiectasis, volume loss and distortion, largely new since 08/24/2020 chest CT. Persistent patchy ground-glass opacity throughout both lungs, significantly decreased in the interval. Suggest follow-up high-resolution chest CT study in 3-6 months.  This is likely related to progressive pulmonary fibrosis COVID D-dimer slightly elevated- lower extremity venous duplex w/o DVT CMV PCR negative, serum crypto antigen neg, Fungitell pending, Aspergillus antigen pending collection noting PCCM discussed with phlebotomy on 2/16 Imaging not consistent with pneumocystis jirovecii   AKI on CKD stage IV/transplant kidney:  Transplant x2.  AKI was most likely hemodynamically mediated.   Creatinine has remained acutely around 1.7.   Nephrology has signed off  Continue Prograf; 2/17 will resume Myfortic since no clear-cut opportunistic infections.  Prednisone on hold in favor of IV Solu-Medrol Avoid nephrotoxic medication Previous hyperkalemia resolved  DKA/diabetes mellitus on insulin with hyperglycemia DKA resolved.  No longer on insulin drip.   2/17 Increased hyperglycemia w/ steroids so will increase Levemir to 30 units and continue SSI to resistent Hemoglobin A1c of 8.2 as per 1/11.    Sinus pause: Happened on  1/20 with 4.5 seconds pause.   Beta-blocker discontinued.  Currently stable.  NSTEMI:  Patient with chest pain on 1/23 with EKG changes.   Treated with Nitroglycerin, morphine and beta-blocker.   Echo is  reassuring with preserved ejection fraction.   Continue aspirin/statin completed 48 hours of IV heparin.   Cardiology signed off.  Chronic normocytic anemia:  Most likely associated  with CKD.  No signs of bleeding.   Status post 1 unit of PRBC transfusion on 09/11/2020.   Continue monitoring hemoglobin intermittently  Deconditioning/debility:  PT/OT recommended skilled nursing facility on discharge but current O2 requirements are too high to facilitate discharge to skilled nursing facility.  Stage I decubitus coccyx Pressure Injury 08/20/20 Coccyx Mid Stage 1 -  Intact skin with non-blanchable redness of a localized area usually over a bony prominence. (Active)  Date First Assessed/Time First Assessed: 08/20/20 0800   Location: Coccyx  Location Orientation: Mid  Staging: Stage 1 -  Intact skin with non-blanchable redness of a localized area usually over a bony prominence.  Present on Admission: Yes    Assessments 08/20/2020  9:00 AM 09/24/2020  8:00 PM  Dressing Type Foam - Lift dressing to assess site every shift None  Dressing Changed Clean;Dry;Intact  Dressing Change Frequency Every 3 days Every 3 days  Site / Wound Assessment Clean;Dry;Pink --  Wound Length (cm) 5 cm --  Wound Width (cm) 0.1 cm --  Wound Depth (cm) 0 cm --  Wound Surface Area (cm^2) 0.5 cm^2 --  Wound Volume (cm^3) 0 cm^3 --  Drainage Amount None --  Treatment Cleansed;Off loading --     No Linked orders to display     Wound / Incision (Open or Dehisced) 08/31/20 Puncture Abdomen Left;Lower From Heparin SQ injections. (Active)  Date First Assessed/Time First Assessed: 08/31/20 2000  Wound Type: Puncture  Location: Abdomen  Location Orientation: Left;Lower  Wound Description (Comments): From Heparin SQ injections.  Present on Admission: No    Assessments 08/31/2020  8:00 PM 09/24/2020  9:00 PM  Site / Wound Assessment -- Purple  Wound Length (cm) 0 cm --  Wound Width (cm) 0 cm --  Wound Depth (cm) 0 cm --   Wound Volume (cm^3) 0 cm^3 --  Wound Surface Area (cm^2) 0 cm^2 --     No Linked orders to display       Other problems: Transaminitis:  Resolved likely secondary to COVID meds as well as hypoperfusion  Right upper quadrant ultrasound is nonacute and showed some sludge/gallstones but no signs of cholecystitis.   Hepatitis panel negative.    Thrombocytopenia:  Most likely associated with acute illness.  Continue to monitor,stable  Complicated UTI:  Completed course of Rocephin.  Hypothyroidism:  Continue Synthyroid.   Repeat TFT in 3 months  Hypertension:  Currently blood pressure stable.  Continue medications  Hyperlipidemia:  Statin held due to elevated LFTs.   LFTs have normalized so can consider resumption of statin although oral intake remains variable  Obesity:  BMI 33.9  Nutrition Status: Estimated body mass index is 35 kg/m as calculated from the following:   Height as of this encounter: 5\' 2"  (1.575 m).   Weight as of this encounter: 86.8 kg.  Poor oral intake 2/2 air hunger          Data Reviewed: Basic Metabolic Panel: Recent Labs  Lab 09/19/20 0229 09/20/20 0354 09/21/20 0235 09/24/20 0423  NA 138 139 137 135  K 4.0 4.3 4.3 4.8  CL 109 106 105 105  CO2 21* 23 22 21*  GLUCOSE 140* 144* 133* 332*  BUN 18 20 22  29*  CREATININE 1.87* 2.09* 2.18* 1.94*  CALCIUM 9.3 9.5 9.1 10.0  PHOS 1.8* 2.4* 2.8  --    Liver Function Tests: Recent Labs  Lab 09/19/20 0229 09/20/20 0354 09/21/20 0235  ALBUMIN 2.9* 3.1* 2.8*   No results for input(s): LIPASE, AMYLASE in the last 168 hours. No results for input(s): AMMONIA in the last 168 hours. CBC: Recent Labs  Lab 09/21/20 0235 09/24/20 0423  WBC 2.9* 5.6  HGB 9.1* 10.1*  HCT 28.5* 31.0*  MCV 84.8 83.1  PLT 116* 136*   Cardiac Enzymes: Recent Labs  Lab 09/22/20 1051  CKTOTAL 15*   BNP (last 3 results) Recent Labs    09/11/20 0155 09/12/20 0113 09/15/20 0028  BNP 86.8 80.1  80.0    ProBNP (last 3 results) No results for input(s): PROBNP in the last 8760 hours.  CBG: Recent Labs  Lab 09/23/20 2133 09/24/20 0727 09/24/20 1128 09/24/20 1705 09/24/20 2135  GLUCAP 327* 309* 359* 146* 207*    Recent Results (from the past 240 hour(s))  Culture, blood (routine x 2)     Status: None (Preliminary result)   Collection Time: 09/22/20  8:26 AM   Specimen: BLOOD LEFT HAND  Result Value Ref Range Status   Specimen Description BLOOD LEFT HAND  Final   Special Requests   Final    BOTTLES DRAWN AEROBIC ONLY Blood Culture adequate volume   Culture   Final    NO GROWTH 2 DAYS Performed at [facility], [place] [address] [place], [place] [address]    Report Status PENDING  Incomplete  Culture, blood (routine x 2)     Status: None (Preliminary result)   Collection Time: 09/22/20  8:27 AM   Specimen: BLOOD LEFT ARM  Result Value Ref Range Status   Specimen Description BLOOD LEFT ARM  Final   Special Requests   Final    BOTTLES DRAWN AEROBIC ONLY Blood Culture adequate volume   Culture   Final    NO GROWTH 2 DAYS Performed at [facility], [address] [place], [place] [address]    Report Status PENDING  Incomplete  MRSA PCR Screening     Status: None   Collection Time: 09/23/20  9:30 AM   Specimen: Nasal Mucosa; Nasopharyngeal  Result Value Ref Range Status   MRSA by PCR NEGATIVE NEGATIVE Final    Comment:        The GeneXpert MRSA Assay (FDA approved for NASAL specimens only), is one component of a comprehensive MRSA colonization surveillance program. It is not intended to diagnose MRSA infection nor to guide or monitor treatment for MRSA infections. Performed at [facility], [place] [address] [place], [place] [address]      Studies: CT CHEST WO CONTRAST  Result Date: 09/24/2020 CLINICAL DATA:  Inpatient. Acute respiratory failure with hypoxia due to COVID 19 pneumonia, now with persistent hypoxemia of uncertain etiology.  EXAM: CT CHEST WITHOUT CONTRAST TECHNIQUE: Multidetector CT imaging of the chest was performed following the standard protocol without IV contrast. COMPARISON:  09/22/2020 chest radiograph.  08/24/2020 chest CT. FINDINGS: Cardiovascular: Mild stable cardiomegaly. No significant pericardial effusion/thickening. Atherosclerotic nonaneurysmal thoracic aorta. Top-normal caliber main pulmonary artery (3.0 cm diameter). Vascular stent again noted in the right retropectoral region. Mediastinum/Nodes: No discrete thyroid nodules. Unremarkable esophagus. No pathologically enlarged axillary, mediastinal or hilar lymph nodes, noting limited sensitivity for the detection of hilar adenopathy on this noncontrast study. Lungs/Pleura: No pneumothorax. No pleural effusion. Diffuse thickening of the peribronchovascular interstitium with associated moderate traction bronchiectasis, volume loss and distortion, largely new since 08/24/2020 chest CT. Persistent patchy ground-glass opacity throughout both lungs, significantly decreased in the interval. No superimposed acute consolidative airspace disease. No discrete lung masses or significant pulmonary nodules. Upper abdomen: Small hiatal hernia. Musculoskeletal: No aggressive appearing focal osseous lesions. Marked thoracic spondylosis. IMPRESSION: 1. Spectrum of findings compatible with evolving severe postinflammatory fibrosis due to history of COVID-19 pneumonia. Diffuse thickening of the peribronchovascular interstitium with associated moderate traction bronchiectasis, volume loss and distortion, largely new since 08/24/2020 chest CT. Persistent patchy ground-glass opacity throughout both lungs, significantly decreased in the interval. Suggest follow-up high-resolution chest CT study in 3-6 months. 2. Mild stable cardiomegaly. 3. Small hiatal hernia. 4. Aortic Atherosclerosis (ICD10-I70.0). Electronically Signed   By: Ilona Sorrel M.D.   On: 09/24/2020 19:39   VAS [place] LOWER  EXTREMITY VENOUS (DVT)  Result Date: 09/24/2020  Lower Venous DVT Study Indications: D-Dimer.  Risk Factors: Immobility. Comparison Study: No previous Performing Technologist: Vonzell Schlatter RVT  Examination Guidelines: A complete evaluation includes B-mode imaging, spectral Doppler, color Doppler, and power Doppler as needed of all accessible portions of each vessel. Bilateral testing is considered an integral part of a complete examination. Limited examinations for reoccurring indications may be performed as noted. The reflux portion of the exam is performed with the patient in reverse Trendelenburg.  +---------+---------------+---------+-----------+----------+--------------+ RIGHT    CompressibilityPhasicitySpontaneityPropertiesThrombus Aging +---------+---------------+---------+-----------+----------+--------------+ CFV      Full           Yes      Yes                                 +---------+---------------+---------+-----------+----------+--------------+  SFJ      Full                                                        +---------+---------------+---------+-----------+----------+--------------+ FV Prox  Full                                                        +---------+---------------+---------+-----------+----------+--------------+ FV Mid   Full                                                        +---------+---------------+---------+-----------+----------+--------------+ FV DistalFull                                                        +---------+---------------+---------+-----------+----------+--------------+ PFV      Full                                                        +---------+---------------+---------+-----------+----------+--------------+ POP      Full           Yes      Yes                                 +---------+---------------+---------+-----------+----------+--------------+ PTV      Full                                                         +---------+---------------+---------+-----------+----------+--------------+ PERO     Full                                                        +---------+---------------+---------+-----------+----------+--------------+   +---------+---------------+---------+-----------+----------+--------------+ LEFT     CompressibilityPhasicitySpontaneityPropertiesThrombus Aging +---------+---------------+---------+-----------+----------+--------------+ CFV      Full           Yes      Yes                                 +---------+---------------+---------+-----------+----------+--------------+ SFJ      Full                                                        +---------+---------------+---------+-----------+----------+--------------+  FV Prox  Full                                                        +---------+---------------+---------+-----------+----------+--------------+ FV Mid   Full                                                        +---------+---------------+---------+-----------+----------+--------------+ FV DistalFull                                                        +---------+---------------+---------+-----------+----------+--------------+ PFV      Full                                                        +---------+---------------+---------+-----------+----------+--------------+ POP      Full           Yes      Yes                                 +---------+---------------+---------+-----------+----------+--------------+ PTV      Full                                                        +---------+---------------+---------+-----------+----------+--------------+ PERO     Full                                                        +---------+---------------+---------+-----------+----------+--------------+  Summary: BILATERAL: - No evidence of deep vein thrombosis seen in the lower extremities,  bilaterally. -No evidence of popliteal cyst, bilaterally.   *See table(s) above for measurements and observations. Electronically signed by Harold Barban MD on 09/24/2020 at 9:48:14 PM.    Final     Scheduled Meds: . amLODipine  10 mg Oral Daily  . vitamin C  500 mg Oral Daily  . aspirin EC  81 mg Oral Daily  . darbepoetin (ARANESP) injection - NON-DIALYSIS  150 mcg Subcutaneous Q Tue-1800  . enoxaparin (LOVENOX) injection  30 mg Subcutaneous Q24H  . feeding supplement (NEPRO CARB STEADY)  237 mL Oral BID BM  . fluticasone  1 spray Each Nare Daily  . folic acid  1 mg Oral Daily  . hydrALAZINE  25 mg Oral Q8H  . insulin aspart  0-20 Units Subcutaneous TID WC  . insulin detemir  30 Units Subcutaneous BID  . isosorbide mononitrate  60 mg Oral Daily  . levothyroxine  50 mcg Oral Q0600  .  LORazepam  1 mg Oral BID  . mouth rinse  15 mL Mouth Rinse BID  . methylPREDNISolone (SOLU-MEDROL) injection  60 mg Intravenous Q6H  . [START ON 09/27/2020] mycophenolate  360 mg Oral BID  . pantoprazole  40 mg Oral BID  . tacrolimus  3 mg Oral QPM  . tacrolimus  4 mg Oral Daily  . zinc sulfate  220 mg Oral Daily   Continuous Infusions: . sodium chloride    . ceFEPime (MAXIPIME) IV Stopped (09/24/20 1226)  . sodium chloride Stopped (08/31/20 1545)    Principal Problem:   ARF (acute renal failure) (HCC) Active Problems:   AKI (acute kidney injury) ([facility])   Fever, unspecified   Renal transplant recipient   Immunocompromised ([place])   DM (diabetes mellitus), type 2 with renal complications ([facility])   Essential hypertension with goal blood pressure less than 140/90   Acute hypoxemic respiratory failure due to COVID-19 ([place])   Respiratory failure (HCC)   Acute hyperkalemia   Diabetic ketoacidosis without coma associated with type 1 diabetes mellitus ([place])   Pressure injury of skin   Acute and chronic respiratory failure with hypoxia ([place])   HCAP (healthcare-associated  pneumonia)   Consultants:  None  Procedures:  Echocardiogram  Antibiotics: Anti-infectives (From admission, onward)   Start     Dose/Rate Route Frequency Ordered Stop   09/24/20 1200  vancomycin (VANCOREADY) IVPB 1000 mg/200 mL  Status:  Discontinued        1,000 mg 200 mL/hr over 60 Minutes Intravenous Every 48 hours 09/22/20 1036 09/24/20 1013   09/22/20 1130  vancomycin (VANCOREADY) IVPB 1750 mg/350 mL        1,750 mg 175 mL/hr over 120 Minutes Intravenous  Once 09/22/20 1036 09/22/20 1735   09/22/20 1130  ceFEPIme (MAXIPIME) 2 g in sodium chloride 0.9 % 100 mL IVPB        2 g 200 mL/hr over 30 Minutes Intravenous Every 24 hours 09/22/20 1036     09/07/20 1030  vancomycin (VANCOCIN) IVPB 1000 mg/200 mL premix  Status:  Discontinued        1,000 mg 200 mL/hr over 60 Minutes Intravenous Every 48 hours 09/05/20 0933 09/07/20 0951   09/05/20 1030  vancomycin (VANCOREADY) IVPB 2000 mg/400 mL        2,000 mg 200 mL/hr over 120 Minutes Intravenous  Once 09/05/20 0933 09/05/20 2018   09/05/20 1030  ceFEPIme (MAXIPIME) 2 g in sodium chloride 0.9 % 100 mL IVPB  Status:  Discontinued        2 g 200 mL/hr over 30 Minutes Intravenous Every 24 hours 09/05/20 0933 09/07/20 0951   08/20/20 1445  cefTRIAXone (ROCEPHIN) 1 g in sodium chloride 0.9 % 100 mL IVPB        1 g 200 mL/hr over 30 Minutes Intravenous Every 24 hours 08/20/20 1348 08/26/20 2147   08/20/20 1000  remdesivir 100 mg in sodium chloride 0.9 % 100 mL IVPB       "Followed by" Linked Group Details   100 mg 200 mL/hr over 30 Minutes Intravenous Daily 08/19/20 0021 08/23/20 1113   08/19/20 0130  remdesivir 200 mg in sodium chloride 0.9% 250 mL IVPB       "Followed by" Linked Group Details   200 mg 580 mL/hr over 30 Minutes Intravenous Once 08/19/20 0021 08/19/20 0314       Time spent: 30 minutes    Erin Hearing ANP  [facility] 7 am - 330 pm/M-F for direct  patient care and secure chat Please refer to Amion  for contact info 37  days

## 2020-09-25 NOTE — Progress Notes (Signed)
Pharmacy Antibiotic Note  [name] [name] is a 65 y.o. female admitted on 08/18/2020 with HCAP.  Pharmacy has been consulted for cefepime dosing. Cultures negative. Patient is day 4 of 7 days planned abx.   Plan: Continue Cefepime 2gm IV q24h Monitor for clinical course and deescalation.   Height: 5\' 2"  (157.5 cm) Weight: 86.8 kg (191 lb 5.8 oz) IBW/kg (Calculated) : 50.1  Temp (24hrs), Avg:98.2 F (36.8 C), Min:97.9 F (36.6 C), Max:98.5 F (36.9 C)  Recent Labs  Lab 09/19/20 0229 09/20/20 0354 09/21/20 0235 09/24/20 0423  WBC  --   --  2.9* 5.6  CREATININE 1.87* 2.09* 2.18* 1.94*    Estimated Creatinine Clearance: 29.6 mL/min (A) (by C-G formula based on SCr of 1.94 mg/dL (H)).    Allergies  Allergen Reactions  . Codeine Hives  . Acetaminophen-Codeine Rash    Antimicrobials this admission: Remdesivir 1/11 >> 1/15 Baricitinib 1/16 >> 1/29 1/28  Cefepime >> 1/30, 2/14>> 1/28 Vancomycin  >> 1/30, 2/14>>2/16 CTX 1/12>>1/18Remdesivir 1/11 >> 1/15  Dose adjustments this admission: n/a  Microbiology results: 11/10 BCx: NG 1/11 UCx: >100K Kleb  1/28  MRSA PCR: neg  Raylon Lamson A. Levada Dy, PharmD, BCPS, FNKF Clinical Pharmacist [place] Please utilize Amion for appropriate phone number to reach the unit pharmacist ([place])   09/25/2020 7:52 AM

## 2020-09-25 NOTE — Progress Notes (Addendum)
Physical Therapy Treatment Patient Details Name: [name] [name] MRN: [id number] DOB: 01/06/1956 Today's Date: 09/25/2020    History of Present Illness Pt is a 65 y.o. female who tested (+) COVID-19 on 08/13/20, now admitted 08/18/20 with worsening SOB, cough, chest pain and body aches. Acute hypoxic respiratory failure due to COVID-19 PNA, DKA, AKI on CKD, UTI. CP on 1/22 with NSTEMI . PMHx: HTN, DM2, chronic steroid use, s/p renal transplant x2.    PT Comments    PT very pleasant trying to scoot up in bed on arrival. Pt with HHFNC 80% at 30L with addition of face mask on arrival and maintained throughout session. Pt with ability to transition to [facility] and chair with seated recovery, cues for breathing technique and increased time. Pt educate for safety with transfers, breathing technique chest expansion exercises and HEP. Will continue to follow.   HR 121 with activity Desaturation to 76% with recovery to 88%, 95% at rest on arrival     Follow Up Recommendations  Supervision/Assistance - 24 hour;LTACH     Equipment Recommendations  3in1 (PT);Hospital bed;Other (comment) (rollator)    Recommendations for Other Services       Precautions / Restrictions Precautions Precautions: Fall Precaution Comments: Watch SpO2 - quick to desaturate on HHFNC, use additional face mask with any mobility    Mobility  Bed Mobility Overal bed mobility: Needs Assistance Bed Mobility: Supine to Sit     Supine to sit: Supervision;HOB elevated     General bed mobility comments: HOB 30 degrees with cues for sequence and pt able to transition from supine to sit without physical assist SpO2 drop from 95% to 87% with HHFNC and face mask    Transfers Overall transfer level: Needs assistance Equipment used: Rolling walker (2 wheeled) Transfers: Sit to/from [facility] Sit to Stand: Min guard Stand pivot transfers: Min assist       General transfer comment: pt able to stand  from bed with guarding for lines and cues for safety. Pt pivoted from bed to bSC with bil HHA and cues for safety with desaturation to 76% with 3 min seated recovery to 88%. PT then stood grossly 90sec for standing pericare with support of RW and pivot to recliner again with desaturation to 78% and 3 min seated rest to recover to 87%  Ambulation/Gait             General Gait Details: not yet able   Stairs             Wheelchair Mobility    Modified Rankin (Stroke Patients Only)       Balance Overall balance assessment: Needs assistance   Sitting balance-Leahy Scale: Fair Sitting balance - Comments: EOB with and without UE support.   Standing balance support: Bilateral upper extremity supported Standing balance-Leahy Scale: Poor Standing balance comment: UE support for standing                            Cognition Arousal/Alertness: Awake/alert Behavior During Therapy: WFL for tasks assessed/performed Overall Cognitive Status: Impaired/Different from baseline Area of Impairment: Safety/judgement;Awareness                       Following Commands: Follows one step commands consistently Safety/Judgement: Decreased awareness of deficits     General Comments: decreased awareness of true medical complexity and deficits      Exercises General Exercises - Lower Extremity Long Arc  Quad: AROM;Both;Seated;15 reps Hip Flexion/Marching: AROM;Both;Seated;15 reps    General Comments        Pertinent Vitals/Pain Pain Assessment: No/denies pain    Home Living                      Prior Function            PT Goals (current goals can now be found in the care plan section) Progress towards PT goals: Progressing toward goals    Frequency    Min 2X/week      PT Plan Current plan remains appropriate    Co-evaluation              AM-PAC PT "6 Clicks" Mobility   Outcome Measure  Help needed turning from your back to  your side while in a flat bed without using bedrails?: A Little Help needed moving from lying on your back to sitting on the side of a flat bed without using bedrails?: A Little Help needed moving to and from a bed to a chair (including a wheelchair)?: A Little Help needed standing up from a chair using your arms (e.g., wheelchair or bedside chair)?: A Little Help needed to walk in hospital room?: A Lot Help needed climbing 3-5 steps with a railing? : Total 6 Click Score: 15    End of Session Equipment Utilized During Treatment: Oxygen Activity Tolerance: Patient tolerated treatment well Patient left: in chair;with call bell/phone within reach;with chair alarm set Nurse Communication: Mobility status;Precautions PT Visit Diagnosis: Other abnormalities of gait and mobility (R26.89);Muscle weakness (generalized) (M62.81)     Time: 4854-6270 PT Time Calculation (min) (ACUTE ONLY): 40 min  Charges:  $Therapeutic Exercise: 8-22 mins $Therapeutic Activity: 23-37 mins                     Kendricks Reap P, PT Acute Rehabilitation Services Pager: [phone] Office: [phone]    Otis Burress B Joscelynn Brutus 09/25/2020, 11:35 AM

## 2020-09-25 NOTE — Progress Notes (Signed)
NAME:  [name] [name], MRN:  [id number], DOB:  25-May-1956, LOS: 37 ADMISSION DATE:  08/18/2020, CONSULTATION DATE:  1/10 REFERRING MD:  Hiram Comber, CHIEF COMPLAINT:  Dyspnea   Brief History:  65 y/o immunocompromised female admitted with COVID pneumonia on 1/20 after testing positive on 12/29. She was admitted to the ICU where she required heated high flow O2 while receiving treatment for COVID 19.  Her oxygenation improved but she has been difficult to discharge due to her COVID status and other complicating factors.  PCCM was consulted again on 1/28 when she developed worsening hypoxemic respiratory failure.  At that time her code status was made DNR and she was treated with broad spectrum antibiotics.  PCCM consulted again on 2/15 due to persistent hypoxemic respiratory failure.    History of Present Illness:  65 y/o immunocompromised female admitted with COVID pneumonia on 1/20 after testing positive on 12/29. She was admitted to the ICU where she required heated high flow O2 while receiving treatment for COVID 19.  Her oxygenation improved but she has been difficult to discharge due to her COVID status and other complicating factors.  PCCM was consulted again on 1/28 when she developed worsening hypoxemic respiratory failure.  At that time her code status was made DNR and she was treated with broad spectrum antibiotics.  PCCM consulted again on 2/15 due to persistent hypoxemic respiratory failure.   At baseline she has a history of renal transplant.  She has been treated with cellcept and prograf here in the hospital.  Today (2/15) she tells me that she has sinus congestion, dry cough.  She didn't feel febrile two days ago.  She denies leg swelling.    Past Medical History:  CKD, s/p renal transplant  Gout due to renal transplant medications Hyperlipidemia Hypertension Sickle cell trait Thyroid disease Type II diabetes UTI  Significant Hospital Events:  1/10 admitted  [facility] 1/11 ICU and CCM 1/14 To triad>> 5W  Consults:  Nephrology  PCCM  Procedures:    Significant Diagnostic Tests:  1/11 renal US >>Elevated resistive indices within the left lower quadrant transplant kidney.   No hydronephrosis. 1/16 CT chest > images personally reviewed> bilateral ground glass opacifications with pleural based nodule in left lower lobe, some interlobular pleural thickening 1/25 Echo> LVEF > 75%, mod LVH, RV systolic function normal, mitral valve normal, aortic valve is tricuspid 09/05/2020 CXR Progression of diffuse bilateral pulmonary opacities  Micro Data:  1/12 MRSA >> neg 1/10 BCx2 >> 1/11 UC >> klebsiella pna 2/14 blood >   Antimicrobials:  1/12 ceftriaxone >>1/18 1/28 vanc x1 1/28 cefepime > 1/29 2/14 cefepime >  2/14 vanc >   COVID: 1/10 remdesivir; 1/12;  1/11 dexamethasone >> 11/13 1/14 solumedrol > 1/23 1/14 prednisone 20mg  > 2/5 2/6 prednisone 5mg  > 2/14 1/16 baricitinib > 1/29 2/14 solumedrol >     Interim History / Subjective:  Feeling like her breathing has improved slightly and she can move around a little more than before.  Objective   Blood pressure (!) 144/83, pulse 96, temperature 97.9 F (36.6 C), temperature source Oral, resp. rate 18, height 5\' 2"  (1.575 m), weight 86.8 kg, SpO2 96 %.    FiO2 (%):  [85 %-100 %] 85 %   Intake/Output Summary (Last 24 hours) at 09/25/2020 0711 Last data filed at 09/25/2020 0500 Gross per 24 hour  Intake 340 ml  Output 1100 ml  Net -760 ml   Filed Weights   09/20/20 0412 09/22/20 0534  09/25/20 0458  Weight: 85.1 kg 83.3 kg 86.8 kg    Examination: General:  Ill appearing woman sitting up in the chair in NAD- recovering from gettign into chair recently HENT: [place]/AT, eyes anicteric PULM: tachypneic, faint rhales bilaterally, no wheezing or rhonchi CV: tachycardic, reg rhythm, no murmurs GI: soft, NT, ND MSK: no LE edema, no cyanosis Neuro: awake, alert, answering questions  appropriately, face symmetric  CT chest personally reviewed> less GGO compared to previous scan, more interlobular septal thickening and traction bronchiectasis concerning for progressive fibrosis  Resolved Hospital Problem list     Assessment & Plan:  Acute respiratory failure with hypoxemia initially due to COVID 19 pneumonia, now with persistent hypoxemia of uncertain etiology.  Suspect that she has COVID related fibrotic lung disease as her primary problem based on repeat CT scan. Given her immunocompromised status, she potentially has a superimposed bacterial HAP causing acute worsening.  Opportunistic organisms are considered, but unfortunately she is too sick for a bronchoscopy currently. Although D-dimer is minimally elevated, PE seems less likely. Negative leg [place] on 2/15. > Continue broad spectrum antibiotics to complete 7 days given perceived clinical benefit.  > Discussed with her that I am concerned for potentially irreversible lung fibrosis causing her worsening. If there has been no significant improvement in her oxygen requirements next week, my suspicion is that her decline can fully be attributed to progressive fibrosis. > continue solumedrol > D/w nephrology; based on CT scan appearance, will add back mycophenolate today > cmv pcr relatively low, unlikely this is an acute CMV infection  > serum crypto ag negative > fungitell & aspergillus ag pending > hold antibiotic coverage as is; could broaden (antifungal coverage) if work up above revealing. Imaging not consistent with PJP. -wean supplemental O2 as able -IS, flutter, OOB mobility   Julian Hy, DO 09/25/20 12:05 PM [facility]  From 7AM- 7PM if no response to pager, please call [phone]. After hours, 7PM- 7AM, please call Elink  [phone].

## 2020-09-26 DIAGNOSIS — D849 Immunodeficiency, unspecified: Secondary | ICD-10-CM | POA: Diagnosis not present

## 2020-09-26 DIAGNOSIS — U071 COVID-19: Secondary | ICD-10-CM | POA: Diagnosis not present

## 2020-09-26 DIAGNOSIS — J9601 Acute respiratory failure with hypoxia: Secondary | ICD-10-CM | POA: Diagnosis not present

## 2020-09-26 DIAGNOSIS — J8410 Pulmonary fibrosis, unspecified: Secondary | ICD-10-CM | POA: Diagnosis not present

## 2020-09-26 DIAGNOSIS — J189 Pneumonia, unspecified organism: Secondary | ICD-10-CM | POA: Diagnosis not present

## 2020-09-26 DIAGNOSIS — J9621 Acute and chronic respiratory failure with hypoxia: Secondary | ICD-10-CM | POA: Diagnosis not present

## 2020-09-26 LAB — GLUCOSE, CAPILLARY
Glucose-Capillary: 196 mg/dL — ABNORMAL HIGH (ref 70–99)
Glucose-Capillary: 232 mg/dL — ABNORMAL HIGH (ref 70–99)
Glucose-Capillary: 227 mg/dL — ABNORMAL HIGH (ref 70–99)
Glucose-Capillary: 192 mg/dL — ABNORMAL HIGH (ref 70–99)

## 2020-09-26 LAB — ASPERGILLUS ANTIGEN, BAL/SERUM: Aspergillus Ag, BAL/Serum: 0.03 Index (ref 0.00–0.49)

## 2020-09-26 NOTE — Progress Notes (Signed)
CSW spoke with [name] [name] at [facility] who states the facility can offer a bed to the patient in her current condition.   CSW spoke with patient at bedside to present her with offer - she was eating lunch and was agreeable to discuss offer with her daughter. Patient asked CSW to call her to daughter to inform her of information.  CSW spoke with patient's daughter [name] [name] to inform her of bed offer from [facility] - she will research the facility and notify CSW of decision on Monday morning.  [name], MSW, LCSW Transitions of Care  Clinical Social Worker II [phone]

## 2020-09-26 NOTE — Progress Notes (Addendum)
[facility] PROGRESS NOTE  Lynita Groseclose ZSM:[id number] DOB: [birth year] DOA: 08/18/2020 PCP: Glendale Chard, MD  Status: Remains inpatient appropriate because:Unsafe d/c plan, IV treatments appropriate due to intensity of illness or inability to take PO and Inpatient level of care appropriate due to severity of illness   Dispo:  Patient From: Home  Planned Disposition: [place]  Expected discharge date: 10/03/2020  Medically stable for discharge: No  Difficult to place: Yes; Barriers to DC: Continues to require very high levels of oxygen secondary to Covid lung injury.  She is physically deconditioned and requires SNF for rehabilitative therapies but given high level of oxygen requirements not a candidate for SNF at this time.  2/15 [facility] -can't accept until FiO2 </= 60%-[facility] has reviewed and have made an offer pending insurance auth-family to meet w/ LTAC reps and tour facility.   Level of care: Progressive  Code Status: DNR  Family Communication: Patient; daughter Mendel Ryder 2/17.  Updated on recent CT results DVT prophylaxis: Lovenox Vaccination status: Fully vaccinated against Covid prior to admission.  Initial vaccine November 2021 with follow-up vaccines March and April 2021  Foley catheter: No  HPI: 6 old female with history of renal transplant x2 on chronic immunosuppression agents, diabetes, hypothyroidism who presented with shortness of breath initially.  She was found to be in acute hypoxic respiratory failure due to Covid pneumonia.  Hospital course was prolonged and was complicated by slow improvement from severe hypoxia, AKI and non-STEMI.  Continues to require high flow oxygen.  Patient was a DNR until 2/10 at which point she changed back to full code. Further discussions held w/ pt and dtr and code status changed back to DNR on 2/12   Subjective: Awake.  Sitting up in chair.  Comfortable upon entry into the  room.  Increased work of breathing noted with phonation with shallow respiratory effort.  Discussed with patient regarding CT findings and concerns from PCCM about permanent lung damage.  Also encouraged patient when done speaking to me to take slower deep breaths through her nose and out through her mouth and focus on trying to expand her lungs as much as possible.  She is aware also that she cannot be accepted into LTAC unless her FiO2 requirement is less than or equal to 60%  Objective: Vitals:   09/26/20 0446 09/26/20 0545  BP: (!) 144/81 (!) 162/84  Pulse: 94 (!) 103  Resp: 16 20  Temp: 97.8 F (36.6 C) 97.8 F (36.6 C)  SpO2: 93% 95%   No intake or output data in the 24 hours ending 09/26/20 0737 Filed Weights   09/22/20 0534 09/25/20 0458 09/26/20 0311  Weight: 83.3 kg 86.8 kg 86.8 kg    Exam:  General: Alert and pleasant and in no acute distress Respiratory:   Bilateral lung sounds are clear to auscultation and diminished throughout more so in the bases.  Increased work of breathing noted with phonation with associated shallow respiratory effort.  HHFNC the 30 L / 70% FiO2 with O2 sats is 93 to 95% Cardiovascular: S1-S2, resting tachycardia while up in the chair otherwise pulse has been in the high 80s to low 90s at rest and during sleep, normotensive, no peripheral edema Abdomen: Abdomen is soft with normoactive bowel sounds and nontender.  LBM 2/17-intake has improved and she is eating about 50 to 75% of meals Neurologic: CN 2-12 grossly intact. Sensation intact, DTR normal. Strength 3-4/5 x all 4 extremities.  Psychiatric: Alert  and oriented x3.  Appropriate mood.   Assessment/Plan: Acute problems: Sepsis /acute on chronic hypoxic respiratory failure due to Covid pneumonia/new hospital associated pneumonia as of 2/14:  Fully vaccinated patient.  Immunocompromised status due to renal transplant, immunosuppressive medications.   Presented with multifocal pneumonia from  Covid.  Completed treatment with IV steroids, baricitinib, remdesivir.  Also was treated with IV antibiotics for superimposed bacterial pneumonia.   Despite aggressive measures, she remains severely hypoxic likely secondary to irreversible pulmonary fibrosis secondary to Covid pneumonitis based on recent CT of the chest.   Continue pulmonary toileting, incentive spirometer/flutter valve.   Continue scheduled Ativan every 12 hours and IV Dilaudid 0.5 mg every 4 hours prn for air hunger and associated anxiety-patient has not been utilizing IV Dilaudid for air hunger Continue Cefepime for a total of 7 days noting clinical improvement with addition of steroids and antibiotics i.e. decreased O2 requirement -2/16 DC'd vancomycin-blood cxs no growth and subsequent infectious work-up serology negative Continue Solumedrol 60 mg IV q 6hrs-taper at discretion of PCCM CRP trend: 5.2> 6.3 > 2.4 with an LDH of 253 2/15 PCCM consulted. Patient too sick/stressed to tolerate bronchoscopy.    CT Chest 2/16: Spectrum of findings compatible with evolving severe postinflammatory fibrosis due to history of COVID-19 pneumonia. Diffuse thickening of the peribronchovascular interstitium with associated moderate traction bronchiectasis, volume loss and distortion, largely new since 08/24/2020 chest CT. Persistent patchy ground-glass opacity throughout both lungs, significantly decreased in the interval. Suggest follow-up high-resolution chest CT study in 3-6 months.  This is likely related to progressive pulmonary fibrosis COVID D-dimer slightly elevated- lower extremity venous duplex w/o DVT CMV PCR negative, serum crypto antigen neg, Fungitell pending, Aspergillus antigen pending collection noting PCCM discussed with phlebotomy on 2/16 Imaging not consistent with pneumocystis jirovecii   AKI on CKD stage IV/transplant kidney:  Transplant x2.  AKI was most likely hemodynamically mediated.   Creatinine has remained acutely  around 1.7.   Nephrology has signed off  Continue Prograf and Myfortic  Prednisone on hold in favor of IV Solu-Medrol-will taper at discretion of PCCM Avoid nephrotoxic medication Previous hyperkalemia resolved  DKA/diabetes mellitus on insulin with hyperglycemia DKA resolved.  No longer on insulin drip.   2/17 Increased hyperglycemia w/ steroids so will increase Levemir to 30 units and continue SSI to resistent CBGs have been between 196 and 236 over the past 24-hours Hemoglobin A1c of 8.2 as per 1/11.    Sinus pause: Happened on  1/20 with 4.5 seconds pause.   Beta-blocker discontinued.  Currently stable.  NSTEMI:  Patient with chest pain on 1/23 with EKG changes.   Treated with Nitroglycerin, morphine and beta-blocker.   Echo is reassuring with preserved ejection fraction.   Continue aspirin/statin completed 48 hours of IV heparin.   Cardiology signed off.  Chronic normocytic anemia:  Most likely associated  with CKD.  No signs of bleeding.   Status post 1 unit of PRBC transfusion on 09/11/2020.   Continue monitoring hemoglobin intermittently  Deconditioning/debility:  PT/OT recommended skilled nursing facility on discharge but current O2 requirements are too high to facilitate discharge to skilled nursing facility. LTAC reviewed chart but FiO2 must be </= 60%  Stage I decubitus coccyx Pressure Injury 08/20/20 Coccyx Mid Stage 1 -  Intact skin with non-blanchable redness of a localized area usually over a bony prominence. (Active)  Date First Assessed/Time First Assessed: 08/20/20 0800   Location: Coccyx  Location Orientation: Mid  Staging: Stage 1 -  Intact skin  with non-blanchable redness of a localized area usually over a bony prominence.  Present on Admission: Yes    Assessments 08/20/2020  9:00 AM 09/24/2020  8:00 PM  Dressing Type Foam - Lift dressing to assess site every shift None  Dressing Changed Clean;Dry;Intact  Dressing Change Frequency Every 3 days Every 3 days   Site / Wound Assessment Clean;Dry;Pink -  Wound Length (cm) 5 cm -  Wound Width (cm) 0.1 cm -  Wound Depth (cm) 0 cm -  Wound Surface Area (cm^2) 0.5 cm^2 -  Wound Volume (cm^3) 0 cm^3 -  Drainage Amount None -  Treatment Cleansed;Off loading -     No Linked orders to display     Wound / Incision (Open or Dehisced) 08/31/20 Puncture Abdomen Left;Lower From Heparin SQ injections. (Active)  Date First Assessed/Time First Assessed: 08/31/20 2000   Wound Type: Puncture  Location: Abdomen  Location Orientation: Left;Lower  Wound Description (Comments): From Heparin SQ injections.  Present on Admission: No    Assessments 08/31/2020  8:00 PM 09/25/2020  8:30 AM  Wound Length (cm) 0 cm -  Wound Width (cm) 0 cm -  Wound Depth (cm) 0 cm -  Wound Volume (cm^3) 0 cm^3 -  Wound Surface Area (cm^2) 0 cm^2 -  Treatment - Cleansed     No Linked orders to display       Other problems: Transaminitis:  Resolved likely secondary to COVID meds as well as hypoperfusion  Right upper quadrant ultrasound is nonacute and showed some sludge/gallstones but no signs of cholecystitis.   Hepatitis panel negative.    Thrombocytopenia:  Most likely associated with acute illness.  Continue to monitor,stable  Complicated UTI:  Completed course of Rocephin.  Hypothyroidism:  Continue Synthyroid.   Repeat TFT in 3 months  Hypertension:  Currently blood pressure stable.  Continue medications  Hyperlipidemia:  Statin held due to elevated LFTs.   LFTs have normalized so can consider resumption of statin although oral intake remains variable  Obesity:  BMI 33.9  Nutrition Status: Estimated body mass index is 35 kg/m as calculated from the following:   Height as of this encounter: 5\' 2"  (1.575 m).   Weight as of this encounter: 86.8 kg.  Poor oral intake 2/2 air hunger          Data Reviewed: Basic Metabolic Panel: Recent Labs  Lab 09/20/20 0354 09/21/20 0235 09/24/20 0423  NA  139 137 135  K 4.3 4.3 4.8  CL 106 105 105  CO2 23 22 21*  GLUCOSE 144* 133* 332*  BUN 20 22 29*  CREATININE 2.09* 2.18* 1.94*  CALCIUM 9.5 9.1 10.0  PHOS 2.4* 2.8  --    Liver Function Tests: Recent Labs  Lab 09/20/20 0354 09/21/20 0235  ALBUMIN 3.1* 2.8*   No results for input(s): LIPASE, AMYLASE in the last 168 hours. No results for input(s): AMMONIA in the last 168 hours. CBC: Recent Labs  Lab 09/21/20 0235 09/24/20 0423  WBC 2.9* 5.6  HGB 9.1* 10.1*  HCT 28.5* 31.0*  MCV 84.8 83.1  PLT 116* 136*   Cardiac Enzymes: Recent Labs  Lab 09/22/20 1051  CKTOTAL 15*   BNP (last 3 results) Recent Labs    09/11/20 0155 09/12/20 0113 09/15/20 0028  BNP 86.8 80.1 80.0    ProBNP (last 3 results) No results for input(s): PROBNP in the last 8760 hours.  CBG: Recent Labs  Lab 09/24/20 2135 09/25/20 0826 09/25/20 1209 09/25/20 1646 09/25/20 2113  GLUCAP 207* 223* 233* 236* 213*    Recent Results (from the past 240 hour(s))  Culture, blood (routine x 2)     Status: None (Preliminary result)   Collection Time: 09/22/20  8:26 AM   Specimen: BLOOD LEFT HAND  Result Value Ref Range Status   Specimen Description BLOOD LEFT HAND  Final   Special Requests   Final    BOTTLES DRAWN AEROBIC ONLY Blood Culture adequate volume   Culture   Final    NO GROWTH 3 DAYS Performed at [facility], [address], [place], [place] [address]    Report Status PENDING  Incomplete  Culture, blood (routine x 2)     Status: None (Preliminary result)   Collection Time: 09/22/20  8:27 AM   Specimen: BLOOD LEFT ARM  Result Value Ref Range Status   Specimen Description BLOOD LEFT ARM  Final   Special Requests   Final    BOTTLES DRAWN AEROBIC ONLY Blood Culture adequate volume   Culture   Final    NO GROWTH 3 DAYS Performed at [facility], [address] [place], [place] [address]    Report Status PENDING  Incomplete  Aspergillus Ag, BAL/Serum     Status: None    Collection Time: 09/23/20  1:01 AM   Specimen: Vein; Blood  Result Value Ref Range Status   Aspergillus Ag, BAL/Serum 0.03 0.00 - 0.49 Index Final    Comment: (NOTE) Performed At: [facility] [place], [place] [id number] Rush Farmer MD ML:[id number]   MRSA PCR Screening     Status: None   Collection Time: 09/23/20  9:30 AM   Specimen: Nasal Mucosa; Nasopharyngeal  Result Value Ref Range Status   MRSA by PCR NEGATIVE NEGATIVE Final    Comment:        The GeneXpert MRSA Assay (FDA approved for NASAL specimens only), is one component of a comprehensive MRSA colonization surveillance program. It is not intended to diagnose MRSA infection nor to guide or monitor treatment for MRSA infections. Performed at [facility], [place] [address], [place], [place] [address]      Studies: CT CHEST WO CONTRAST  Result Date: 09/24/2020 CLINICAL DATA:  Inpatient. Acute respiratory failure with hypoxia due to COVID 19 pneumonia, now with persistent hypoxemia of uncertain etiology. EXAM: CT CHEST WITHOUT CONTRAST TECHNIQUE: Multidetector CT imaging of the chest was performed following the standard protocol without IV contrast. COMPARISON:  09/22/2020 chest radiograph.  08/24/2020 chest CT. FINDINGS: Cardiovascular: Mild stable cardiomegaly. No significant pericardial effusion/thickening. Atherosclerotic nonaneurysmal thoracic aorta. Top-normal caliber main pulmonary artery (3.0 cm diameter). Vascular stent again noted in the right retropectoral region. Mediastinum/Nodes: No discrete thyroid nodules. Unremarkable esophagus. No pathologically enlarged axillary, mediastinal or hilar lymph nodes, noting limited sensitivity for the detection of hilar adenopathy on this noncontrast study. Lungs/Pleura: No pneumothorax. No pleural effusion. Diffuse thickening of the peribronchovascular interstitium with associated moderate traction bronchiectasis, volume loss and distortion, largely  new since 08/24/2020 chest CT. Persistent patchy ground-glass opacity throughout both lungs, significantly decreased in the interval. No superimposed acute consolidative airspace disease. No discrete lung masses or significant pulmonary nodules. Upper abdomen: Small hiatal hernia. Musculoskeletal: No aggressive appearing focal osseous lesions. Marked thoracic spondylosis. IMPRESSION: 1. Spectrum of findings compatible with evolving severe postinflammatory fibrosis due to history of COVID-19 pneumonia. Diffuse thickening of the peribronchovascular interstitium with associated moderate traction bronchiectasis, volume loss and distortion, largely new since 08/24/2020 chest CT. Persistent patchy  ground-glass opacity throughout both lungs, significantly decreased in the interval. Suggest follow-up high-resolution chest CT study in 3-6 months. 2. Mild stable cardiomegaly. 3. Small hiatal hernia. 4. Aortic Atherosclerosis (ICD10-I70.0). Electronically Signed   By: Ilona Sorrel M.D.   On: 09/24/2020 19:39    Scheduled Meds: . amLODipine  10 mg Oral Daily  . vitamin C  500 mg Oral Daily  . aspirin EC  81 mg Oral Daily  . darbepoetin (ARANESP) injection - NON-DIALYSIS  150 mcg Subcutaneous Q Tue-1800  . enoxaparin (LOVENOX) injection  30 mg Subcutaneous Q24H  . feeding supplement (NEPRO CARB STEADY)  237 mL Oral BID BM  . fluticasone  1 spray Each Nare Daily  . folic acid  1 mg Oral Daily  . hydrALAZINE  25 mg Oral Q8H  . insulin aspart  0-20 Units Subcutaneous TID WC  . insulin detemir  30 Units Subcutaneous BID  . isosorbide mononitrate  60 mg Oral Daily  . levothyroxine  50 mcg Oral Q0600  . LORazepam  1 mg Oral BID  . mouth rinse  15 mL Mouth Rinse BID  . methylPREDNISolone (SOLU-MEDROL) injection  60 mg Intravenous Q6H  . mycophenolate  360 mg Oral BID  . pantoprazole  40 mg Oral BID  . tacrolimus  3 mg Oral QPM  . tacrolimus  4 mg Oral Daily  . zinc sulfate  220 mg Oral Daily   Continuous  Infusions: . sodium chloride    . ceFEPime (MAXIPIME) IV 2 g (09/25/20 1206)  . sodium chloride Stopped (08/31/20 1545)    Principal Problem:   ARF (acute renal failure) (HCC) Active Problems:   AKI (acute kidney injury) ([place])   Fever, unspecified   Renal transplant recipient   Immunocompromised ([place])   DM (diabetes mellitus), type 2 with renal complications ([place])   Essential hypertension with goal blood pressure less than 140/90   Acute hypoxemic respiratory failure due to COVID-19 ([place])   Respiratory failure (HCC)   Acute hyperkalemia   Diabetic ketoacidosis without coma associated with type 1 diabetes mellitus ([place])   Pressure injury of skin   Acute and chronic respiratory failure with hypoxia ([place])   HCAP (healthcare-associated pneumonia)   Consultants:  None  Procedures:  Echocardiogram  Antibiotics: Anti-infectives (From admission, onward)   Start     Dose/Rate Route Frequency Ordered Stop   09/24/20 1200  vancomycin (VANCOREADY) IVPB 1000 mg/200 mL  Status:  Discontinued        1,000 mg 200 mL/hr over 60 Minutes Intravenous Every 48 hours 09/22/20 1036 09/24/20 1013   09/22/20 1130  vancomycin (VANCOREADY) IVPB 1750 mg/350 mL        1,750 mg 175 mL/hr over 120 Minutes Intravenous  Once 09/22/20 1036 09/22/20 1735   09/22/20 1130  ceFEPIme (MAXIPIME) 2 g in sodium chloride 0.9 % 100 mL IVPB        2 g 200 mL/hr over 30 Minutes Intravenous Every 24 hours 09/22/20 1036     09/07/20 1030  vancomycin (VANCOCIN) IVPB 1000 mg/200 mL premix  Status:  Discontinued        1,000 mg 200 mL/hr over 60 Minutes Intravenous Every 48 hours 09/05/20 0933 09/07/20 0951   09/05/20 1030  vancomycin (VANCOREADY) IVPB 2000 mg/400 mL        2,000 mg 200 mL/hr over 120 Minutes Intravenous  Once 09/05/20 0933 09/05/20 2018   09/05/20 1030  ceFEPIme (MAXIPIME) 2 g in sodium chloride 0.9 % 100 mL IVPB  Status:  Discontinued        2 g 200 mL/hr over 30 Minutes Intravenous Every 24  hours 09/05/20 0933 09/07/20 0951   08/20/20 1445  cefTRIAXone (ROCEPHIN) 1 g in sodium chloride 0.9 % 100 mL IVPB        1 g 200 mL/hr over 30 Minutes Intravenous Every 24 hours 08/20/20 1348 08/26/20 2147   08/20/20 1000  remdesivir 100 mg in sodium chloride 0.9 % 100 mL IVPB       "Followed by" Linked Group Details   100 mg 200 mL/hr over 30 Minutes Intravenous Daily 08/19/20 0021 08/23/20 1113   08/19/20 0130  remdesivir 200 mg in sodium chloride 0.9% 250 mL IVPB       "Followed by" Linked Group Details   200 mg 580 mL/hr over 30 Minutes Intravenous Once 08/19/20 0021 08/19/20 0314       Time spent: 30 minutes    Erin Hearing ANP  [facility] 7 am - 330 pm/M-F for direct patient care and secure chat Please refer to Amion for contact info 38  days

## 2020-09-26 NOTE — Progress Notes (Signed)
NAME:  [name] [name], MRN:  [id number], DOB:  [birth year], LOS: 47 ADMISSION DATE:  08/18/2020, CONSULTATION DATE:  1/10 REFERRING MD:  Hiram Comber, CHIEF COMPLAINT:  Dyspnea   Brief History:  65 y/o immunocompromised female admitted with COVID pneumonia on 1/20 after testing positive on 12/29. She was admitted to the ICU where she required heated high flow O2 while receiving treatment for COVID 19.  Her oxygenation improved but she has been difficult to discharge due to her COVID status and other complicating factors.  PCCM was consulted again on 1/28 when she developed worsening hypoxemic respiratory failure.  At that time her code status was made DNR and she was treated with broad spectrum antibiotics.  PCCM consulted again on 2/15 due to persistent hypoxemic respiratory failure.    History of Present Illness:  65 y/o immunocompromised female admitted with COVID pneumonia on 1/20 after testing positive on 12/29. She was admitted to the ICU where she required heated high flow O2 while receiving treatment for COVID 19.  Her oxygenation improved but she has been difficult to discharge due to her COVID status and other complicating factors.  PCCM was consulted again on 1/28 when she developed worsening hypoxemic respiratory failure.  At that time her code status was made DNR and she was treated with broad spectrum antibiotics.  PCCM consulted again on 2/15 due to persistent hypoxemic respiratory failure.   At baseline she has a history of renal transplant.  She has been treated with cellcept and prograf here in the hospital.  Today (2/15) she tells me that she has sinus congestion, dry cough.  She didn't feel febrile two days ago.  She denies leg swelling.    Past Medical History:  CKD, s/p renal transplant  Gout due to renal transplant medications Hyperlipidemia Hypertension Sickle cell trait Thyroid disease Type II diabetes UTI  Significant Hospital Events:  1/10 admitted  [facility] 1/11 ICU and CCM 1/14 To [facility]>> 5W  Consults:  Nephrology  PCCM  Procedures:    Significant Diagnostic Tests:  1/11 renal US >>Elevated resistive indices within the left lower quadrant transplant kidney.   No hydronephrosis. 1/16 CT chest > images personally reviewed> bilateral ground glass opacifications with pleural based nodule in left lower lobe, some interlobular pleural thickening 1/25 Echo> LVEF > 75%, mod LVH, RV systolic function normal, mitral valve normal, aortic valve is tricuspid 09/05/2020 CXR Progression of diffuse bilateral pulmonary opacities 2/17 CT chest > less GGO compared to previous scan, more interlobular septal thickening and traction bronchiectasis concerning for progressive fibrosis  Micro Data:  1/12 MRSA >> neg 1/10 BCx2 >> 1/11 UC >> klebsiella pna 2/14 blood >   Antimicrobials:  1/12 ceftriaxone >>1/18 1/28 vanc x1 1/28 cefepime > 1/29, 2/14 >  2/14 vanc >   COVID: 1/10 remdesivir; 1/12;  1/11 dexamethasone >> 11/13 1/14 solumedrol > 1/23 1/14 prednisone 20mg  > 2/5 2/6 prednisone 5mg  > 2/14 1/16 baricitinib > 1/29 2/14 solumedrol >     Interim History / Subjective:  [name] [name] denies complaints. Her SOB is slowly improving. She remained afebrile overnight.   Objective   Blood pressure 137/68, pulse (!) 111, temperature 97.9 F (36.6 C), temperature source Oral, resp. rate 20, height 5\' 2"  (1.575 m), weight 86.8 kg, SpO2 90 %.    FiO2 (%):  [70 %-80 %] 70 %   Intake/Output Summary (Last 24 hours) at 09/26/2020 1109 Last data filed at 09/26/2020 0900 Gross per 24 hour  Intake 240 ml  Output --  Net 240 ml   Filed Weights   09/22/20 0534 09/25/20 0458 09/26/20 0311  Weight: 83.3 kg 86.8 kg 86.8 kg    Examination: General:  Chronically ill appearing woman sitting up in bed in NAD HENT: [place]/AT, eyes anicteric PULM: tachypneic, no accessory muscle use. Mild conversational dyspnea. Faint rhales bilaterally. Remains on HHFNC  30L/ 70% CV: tachycardic, reg rhythm GI: soft, NT, ND MSK: no c/c/e Neuro: awake and alert, answering questions appropriately, moving all extremities  Aspergillus serum 0.03 Crypto Ag negative CMV quant DNA 546 fungitell pending  Resolved Hospital Problem list     Assessment & Plan:  Acute respiratory failure with hypoxemia initially due to COVID 19 pneumonia, now with persistent hypoxemia.  Suspect that she has COVID related fibrotic lung disease as her primary problem based on repeat CT scan. Given her immunocompromised status, she potentially has a superimposed bacterial HAP causing acute worsening.  Opportunistic organisms are considered, but unfortunately she is too sick for a bronchoscopy currently. Although D-dimer is minimally elevated, PE seems less likely. Negative leg [place] on 2/15. > Continue broad spectrum antibiotics to complete 7 days given perceived clinical benefit.  > Discussed with her that I am concerned for potentially irreversible lung fibrosis causing her worsening. If there has been no significant improvement in her oxygen requirements next week, my suspicion is that her decline can fully be attributed to progressive fibrosis. > continue solumedrol > cmv pcr relatively low, unlikely this is an acute CMV infection  > serum crypto ag and aspergillus ag negative > fungitell pending -checking fungal sputum culture if able to produce one > hold antibiotic coverage as is; could broaden (antifungal coverage) if work up above revealing. Imaging not consistent with PJP. -wean supplemental O2 as able> down to 70% today -IS, flutter, OOB mobility -rechecking CRP, BMP tomorrow; need to monitor renal function on tacrolimus   Julian Hy, DO 09/26/20 12:06 PM [facility]  From 7AM- 7PM if no response to pager, please call [phone]. After hours, 7PM- 7AM, please call Elink  [phone].

## 2020-09-26 NOTE — Progress Notes (Signed)
Inpatient Diabetes Program Recommendations  AACE/ADA: New Consensus Statement on Inpatient Glycemic Control   Target Ranges:  Prepandial:   less than 140 mg/dL      Peak postprandial:   less than 180 mg/dL (1-2 hours)      Critically ill patients:  140 - 180 mg/dL     Review of Glycemic Control Results for CERIAH, KOHLER WHITE (MRN [id number]) as of 09/26/2020 09:27  Ref. Range 09/25/2020 08:26 09/25/2020 12:09 09/25/2020 16:46 09/25/2020 21:13 09/26/2020 08:30  Glucose-Capillary Latest Ref Range: 70 - 99 mg/dL 223 (H) 233 (H) 236 (H) 213 (H) 196 (H)   Diabetes history:DM Outpatient Diabetes medications:Levemir 30 units QAM, Levemir 35 units QHS, Novolog 4 units TID with meals Current orders for Inpatient glycemic control:Levemir 30 units BID, Novolog 0-20 units TID with meals; Solumedrol 60 mg Q6H  Inpatient Diabetes Program Recommendations:    -  If steroids are continued as ordered please consider increasing Levemir to 35 units bid   -  Add Novolog 0-5 units QHS for bedtime correction.  Thanks, Tama Headings RN, MSN, BC-ADM Inpatient Diabetes Coordinator Team Pager [phone] (8a-5p)

## 2020-09-27 DIAGNOSIS — N179 Acute kidney failure, unspecified: Secondary | ICD-10-CM | POA: Diagnosis not present

## 2020-09-27 LAB — CULTURE, BLOOD (ROUTINE X 2)
Culture: NO GROWTH
Culture: NO GROWTH
Special Requests: ADEQUATE
Special Requests: ADEQUATE

## 2020-09-27 LAB — CBC
HCT: 31.5 % — ABNORMAL LOW (ref 36.0–46.0)
Hemoglobin: 10.6 g/dL — ABNORMAL LOW (ref 12.0–15.0)
MCH: 27.2 pg (ref 26.0–34.0)
MCHC: 33.7 g/dL (ref 30.0–36.0)
MCV: 81 fL (ref 80.0–100.0)
Platelets: 143 10*3/uL — ABNORMAL LOW (ref 150–400)
RBC: 3.89 MIL/uL (ref 3.87–5.11)
RDW: 20.9 % — ABNORMAL HIGH (ref 11.5–15.5)
WBC: 7.3 10*3/uL (ref 4.0–10.5)
nRBC: 1.8 % — ABNORMAL HIGH (ref 0.0–0.2)

## 2020-09-27 LAB — BASIC METABOLIC PANEL
Anion gap: 10 (ref 5–15)
BUN: 45 mg/dL — ABNORMAL HIGH (ref 8–23)
CO2: 20 mmol/L — ABNORMAL LOW (ref 22–32)
Calcium: 10.1 mg/dL (ref 8.9–10.3)
Chloride: 109 mmol/L (ref 98–111)
Creatinine, Ser: 1.92 mg/dL — ABNORMAL HIGH (ref 0.44–1.00)
GFR, Estimated: 29 mL/min — ABNORMAL LOW (ref >60)
Glucose, Bld: 137 mg/dL — ABNORMAL HIGH (ref 70–99)
Potassium: 4.2 mmol/L (ref 3.5–5.1)
Sodium: 139 mmol/L (ref 135–145)

## 2020-09-27 LAB — FUNGITELL, SERUM: Fungitell Result: 34 pg/mL (ref <80)

## 2020-09-27 LAB — GLUCOSE, CAPILLARY
Glucose-Capillary: 124 mg/dL — ABNORMAL HIGH (ref 70–99)
Glucose-Capillary: 162 mg/dL — ABNORMAL HIGH (ref 70–99)
Glucose-Capillary: 217 mg/dL — ABNORMAL HIGH (ref 70–99)
Glucose-Capillary: 187 mg/dL — ABNORMAL HIGH (ref 70–99)

## 2020-09-27 LAB — C-REACTIVE PROTEIN: CRP: 0.5 mg/dL (ref <1.0)

## 2020-09-27 NOTE — Progress Notes (Addendum)
PROGRESS NOTE    [name] [name]  HGD:[id number] DOB: [birth year] DOA: 08/18/2020 PCP: Glendale Chard, MD   Brief Narrative: 65 year old with past medical history significant for renal transplant x2 on chronic immunosuppression agents, diabetes, hypothyroidism who presented with shortness of breath initially.  He was found to be in acute hypoxic respiratory failure due to Covid pneumonia.  Hospital course was prolonged and was complicated by a slow improvement from severe hypoxia, AKI and non-STEMI.  Continue to require high flow oxygen.  Patient was a DNR 1-2/10 at which point she changed back to full code.  Further discussion held with patient and daughter CODE STATUS was changed back to DNR on 2/12.   Assessment & Plan:   Principal Problem:   ARF (acute renal failure) (HCC) Active Problems:   AKI (acute kidney injury) ([place])   Fever, unspecified   Renal transplant recipient   Immunocompromised ([place])   DM (diabetes mellitus), type 2 with renal complications ([place])   Essential hypertension with goal blood pressure less than 140/90   Acute hypoxemic respiratory failure due to COVID-19 ([place])   Respiratory failure (HCC)   Acute hyperkalemia   Diabetic ketoacidosis without coma associated with type 1 diabetes mellitus ([place])   Pressure injury of skin   Acute and chronic respiratory failure with hypoxia (HCC)   HCAP (healthcare-associated pneumonia)   Pulmonary fibrosis, postinflammatory (HCC)   1-sepsis/acute on chronic respiratory failure hypoxic secondary to Covid pneumonia, new associated pneumonia -Completed treatment with IV steroids, remdesivir.  -Completed cefepime for 7 days -Currently on IV Solu-Medrol -Currently require 30 L high flow oxygen  2-AKI CKD stage IV transplant kidney Transplant x2.  AKI was most likely hemodynamically mediated. Stable around 1.9 Continue with Prograf and Myfortic We will need to resume prednisone after she completed course of IV  Solu-Medrol  3-diabetes DKA hyperglycemia. DKA resolved. Continue with Levemir and sliding scale insulin  4-sinus pauses: Beta-blocker discontinue None NSTEMI: Continue with aspirin and statin.  Completed IV heparin for 48 hours.  5-normocytic anemia: Continue to monitor hemoglobin.  Stage I decubitus cocci continue with local care   Pressure Injury 08/20/20 Coccyx Mid Stage 1 -  Intact skin with non-blanchable redness of a localized area usually over a bony prominence. (Active)  08/20/20 0800  Location: Coccyx  Location Orientation: Mid  Staging: Stage 1 -  Intact skin with non-blanchable redness of a localized area usually over a bony prominence.  Wound Description (Comments):   Present on Admission: Yes                  Estimated body mass index is 35 kg/m as calculated from the following:   Height as of this encounter: 5\' 2"  (1.575 m).   Weight as of this encounter: 86.8 kg.   DVT prophylaxis: Lovenox Code Status: DNR Disposition Plan:  Status is: Inpatient  Remains inpatient appropriate because:Unsafe d/c plan   Dispo:  Patient From: Home  Planned Disposition: LTAC  Expected discharge date: 10/03/2020  Medically stable for discharge: No       Antimicrobials:    Subjective: She is sitting recliner.  Denies worsening dyspnea.   Objective: Vitals:   09/27/20 0332 09/27/20 0757 09/27/20 0800 09/27/20 1232  BP: (!) 158/87  (!) 157/91 (!) 173/77  Pulse: 95  99 (!) 105  Resp: (!) 21  15 15   Temp: 98.1 F (36.7 C)  98 F (36.7 C) 98.7 F (37.1 C)  TempSrc: Oral  Oral Oral  SpO2: 90% 92% 91%  100%  Weight:      Height:       No intake or output data in the 24 hours ending 09/27/20 1353 Filed Weights   09/22/20 0534 09/25/20 0458 09/26/20 0311  Weight: 83.3 kg 86.8 kg 86.8 kg    Examination:  General exam: Appears calm and comfortable  Respiratory system: No wheezing.  Cardiovascular system: S1 & S2 heard,  Gastrointestinal system:  Abdomen is nondistended, soft and nontender. No organomegaly or masses felt. Normal bowel sounds heard. Central nervous system: Alert and oriented.  Extremities: Symmetric 5 x 5 power.   Data Reviewed: I have personally reviewed following labs and imaging studies  CBC: Recent Labs  Lab 09/21/20 0235 09/24/20 0423 09/27/20 0126  WBC 2.9* 5.6 7.3  HGB 9.1* 10.1* 10.6*  HCT 28.5* 31.0* 31.5*  MCV 84.8 83.1 81.0  PLT 116* 136* 416*   Basic Metabolic Panel: Recent Labs  Lab 09/21/20 0235 09/24/20 0423 09/27/20 0126  NA 137 135 139  K 4.3 4.8 4.2  CL 105 105 109  CO2 22 21* 20*  GLUCOSE 133* 332* 137*  BUN 22 29* 45*  CREATININE 2.18* 1.94* 1.92*  CALCIUM 9.1 10.0 10.1  PHOS 2.8  --   --    GFR: Estimated Creatinine Clearance: 29.9 mL/min (A) (by C-G formula based on SCr of 1.92 mg/dL (H)). Liver Function Tests: Recent Labs  Lab 09/21/20 0235  ALBUMIN 2.8*   No results for input(s): LIPASE, AMYLASE in the last 168 hours. No results for input(s): AMMONIA in the last 168 hours. Coagulation Profile: No results for input(s): INR, PROTIME in the last 168 hours. Cardiac Enzymes: Recent Labs  Lab 09/22/20 1051  CKTOTAL 15*   BNP (last 3 results) No results for input(s): PROBNP in the last 8760 hours. HbA1C: No results for input(s): HGBA1C in the last 72 hours. CBG: Recent Labs  Lab 09/26/20 1204 09/26/20 1652 09/26/20 2134 09/27/20 0756 09/27/20 1231  GLUCAP 232* 227* 192* 124* 162*   Lipid Profile: No results for input(s): CHOL, HDL, LDLCALC, TRIG, CHOLHDL, LDLDIRECT in the last 72 hours. Thyroid Function Tests: No results for input(s): TSH, T4TOTAL, FREET4, T3FREE, THYROIDAB in the last 72 hours. Anemia Panel: No results for input(s): VITAMINB12, FOLATE, FERRITIN, TIBC, IRON, RETICCTPCT in the last 72 hours. Sepsis Labs: Recent Labs  Lab 09/22/20 1051 09/23/20 0101 09/24/20 0423  PROCALCITON 0.10 <0.10 0.11    Recent Results (from the past 240  hour(s))  Culture, blood (routine x 2)     Status: None   Collection Time: 09/22/20  8:26 AM   Specimen: BLOOD LEFT HAND  Result Value Ref Range Status   Specimen Description BLOOD LEFT HAND  Final   Special Requests   Final    BOTTLES DRAWN AEROBIC ONLY Blood Culture adequate volume   Culture   Final    NO GROWTH 5 DAYS Performed at [facility], [address], [place], [place] [address]    Report Status 09/27/2020 FINAL  Final  Culture, blood (routine x 2)     Status: None   Collection Time: 09/22/20  8:27 AM   Specimen: BLOOD LEFT ARM  Result Value Ref Range Status   Specimen Description BLOOD LEFT ARM  Final   Special Requests   Final    BOTTLES DRAWN AEROBIC ONLY Blood Culture adequate volume   Culture   Final    NO GROWTH 5 DAYS Performed at [facility], [address], [place], [place]  [address]    Report Status 09/27/2020 FINAL  Final  Aspergillus Ag, BAL/Serum     Status: None   Collection Time: 09/23/20  1:01 AM   Specimen: Vein; Blood  Result Value Ref Range Status   Aspergillus Ag, BAL/Serum 0.03 0.00 - 0.49 Index Final    Comment: (NOTE) Performed At: [facility] [employer] [place], [place] [id number] Rush Farmer MD [id number]   MRSA PCR Screening     Status: None   Collection Time: 09/23/20  9:30 AM   Specimen: Nasal Mucosa; Nasopharyngeal  Result Value Ref Range Status   MRSA by PCR NEGATIVE NEGATIVE Final    Comment:        The GeneXpert MRSA Assay (FDA approved for NASAL specimens only), is one component of a comprehensive MRSA colonization surveillance program. It is not intended to diagnose MRSA infection nor to guide or monitor treatment for MRSA infections. Performed at [facility], [place] [address], [place], [place] [address]          Radiology Studies: No results found.      Scheduled Meds: . amLODipine  10 mg Oral Daily  . vitamin C  500 mg Oral Daily  . aspirin EC  81 mg Oral Daily  .  darbepoetin (ARANESP) injection - NON-DIALYSIS  150 mcg Subcutaneous Q Tue-1800  . enoxaparin (LOVENOX) injection  30 mg Subcutaneous Q24H  . feeding supplement (NEPRO CARB STEADY)  237 mL Oral BID BM  . fluticasone  1 spray Each Nare Daily  . folic acid  1 mg Oral Daily  . hydrALAZINE  25 mg Oral Q8H  . insulin aspart  0-20 Units Subcutaneous TID WC  . insulin detemir  30 Units Subcutaneous BID  . isosorbide mononitrate  60 mg Oral Daily  . levothyroxine  50 mcg Oral Q0600  . LORazepam  1 mg Oral BID  . mouth rinse  15 mL Mouth Rinse BID  . methylPREDNISolone (SOLU-MEDROL) injection  60 mg Intravenous Q6H  . mycophenolate  360 mg Oral BID  . pantoprazole  40 mg Oral BID  . tacrolimus  3 mg Oral QPM  . tacrolimus  4 mg Oral Daily  . zinc sulfate  220 mg Oral Daily   Continuous Infusions: . sodium chloride    . sodium chloride Stopped (08/31/20 1545)     LOS: 39 days    Time spent: 35 minutes.     Elmarie Shiley, MD [facility]   If 7PM-7AM, please contact night-coverage [email or web address]  09/27/2020, 1:53 PM

## 2020-09-28 DIAGNOSIS — N179 Acute kidney failure, unspecified: Secondary | ICD-10-CM | POA: Diagnosis not present

## 2020-09-28 LAB — GLUCOSE, CAPILLARY
Glucose-Capillary: 122 mg/dL — ABNORMAL HIGH (ref 70–99)
Glucose-Capillary: 234 mg/dL — ABNORMAL HIGH (ref 70–99)
Glucose-Capillary: 202 mg/dL — ABNORMAL HIGH (ref 70–99)
Glucose-Capillary: 167 mg/dL — ABNORMAL HIGH (ref 70–99)

## 2020-09-28 MED ORDER — HYDRALAZINE HCL 50 MG PO TABS
50.0000 mg | ORAL_TABLET | Freq: Three times a day (TID) | ORAL | Status: DC
  Administered 2020-09-28 – 2020-10-13 (×46): 50 mg via ORAL
  Filled 2020-09-28 (×46): qty 1

## 2020-09-28 NOTE — Progress Notes (Signed)
PROGRESS NOTE    [name] [name]  VFI:[id number] DOB: 1956/01/15 DOA: 08/18/2020 PCP: Glendale Chard, MD   Brief Narrative: 65 year old with past medical history significant for renal transplant x2 on chronic immunosuppression agents, diabetes, hypothyroidism who presented with shortness of breath initially.  He was found to be in acute hypoxic respiratory failure due to Covid pneumonia.  Hospital course was prolonged and was complicated by a slow improvement from severe hypoxia, AKI and non-STEMI.  Continue to require high flow oxygen.  Patient was a DNR 1-2/10 at which point she changed back to full code.  Further discussion held with patient and daughter CODE STATUS was changed back to DNR on 2/12.   Assessment & Plan:   Principal Problem:   ARF (acute renal failure) (HCC) Active Problems:   AKI (acute kidney injury) ([place])   Fever, unspecified   Renal transplant recipient   Immunocompromised ([place])   DM (diabetes mellitus), type 2 with renal complications ([place])   Essential hypertension with goal blood pressure less than 140/90   Acute hypoxemic respiratory failure due to COVID-19 ([place])   Respiratory failure (HCC)   Acute hyperkalemia   Diabetic ketoacidosis without coma associated with type 1 diabetes mellitus ([place])   Pressure injury of skin   Acute and chronic respiratory failure with hypoxia (HCC)   HCAP (healthcare-associated pneumonia)   Pulmonary fibrosis, postinflammatory (HCC)   1-Sepsis/Acute on Chronic Respiratory Failure Hypoxic secondary to Covid pneumonia,  new associated pneumonia -Completed treatment with IV steroids, remdesivir.  -Completed cefepime for 7 days -Currently on IV Solu-Medrol -Currently require 30 L high flow oxygen, stable.   2-AKI CKD stage IV transplant kidney Transplant x2.  AKI was most likely hemodynamically mediated. Stable around 1.9 Continue with Prograf and Myfortic We will need to resume prednisone after she completed course  of IV Solu-Medrol  3-Diabetes DKA hyperglycemia. DKA resolved. Continue with Levemir and sliding scale insulin  4-Sinus pauses: Beta-blocker discontinue 5-Non NSTEMI: Continue with aspirin and statin.  Completed IV heparin for 48 hours.  6-Normocytic Anemia: Continue to monitor hemoglobin. 7-HTN; uncontrolled, will increase hydralazine today   Stage I decubitus cocci continue with local care   Pressure Injury 08/20/20 Coccyx Mid Stage 1 -  Intact skin with non-blanchable redness of a localized area usually over a bony prominence. (Active)  08/20/20 0800  Location: Coccyx  Location Orientation: Mid  Staging: Stage 1 -  Intact skin with non-blanchable redness of a localized area usually over a bony prominence.  Wound Description (Comments):   Present on Admission: Yes                  Estimated body mass index is 35 kg/m as calculated from the following:   Height as of this encounter: 5\' 2"  (1.575 m).   Weight as of this encounter: 86.8 kg.   DVT prophylaxis: Lovenox Code Status: DNR Disposition Plan:  Status is: Inpatient  Remains inpatient appropriate because:Unsafe d/c plan   Dispo:  Patient From: Home  Planned Disposition: LTAC  Expected discharge date: 10/03/2020  Medically stable for discharge: No       Antimicrobials:    Subjective: She feels well, denies worsening dyspnea.    Objective: Vitals:   09/28/20 0100 09/28/20 0504 09/28/20 0807 09/28/20 1103  BP:  (!) 150/82  (!) 180/85  Pulse: 89 97  (!) 103  Resp: (!) 22 20  20   Temp:  98 F (36.7 C)  98.5 F (36.9 C)  TempSrc:  Oral  Axillary  SpO2: 91% 90% 90% 95%  Weight:      Height:        Intake/Output Summary (Last 24 hours) at 09/28/2020 1305 Last data filed at 09/28/2020 0500 Gross per 24 hour  Intake -  Output 800 ml  Net -800 ml   Filed Weights   09/22/20 0534 09/25/20 0458 09/26/20 0311  Weight: 83.3 kg 86.8 kg 86.8 kg    Examination:  General exam:  NAD Respiratory system: No ronchus, no wheezing Cardiovascular system: S 1, S 2  Gastrointestinal system: BS present, soft, nt Central nervous system: alert Extremities: no edema   Data Reviewed: I have personally reviewed following labs and imaging studies  CBC: Recent Labs  Lab 09/24/20 0423 09/27/20 0126  WBC 5.6 7.3  HGB 10.1* 10.6*  HCT 31.0* 31.5*  MCV 83.1 81.0  PLT 136* 017*   Basic Metabolic Panel: Recent Labs  Lab 09/24/20 0423 09/27/20 0126  NA 135 139  K 4.8 4.2  CL 105 109  CO2 21* 20*  GLUCOSE 332* 137*  BUN 29* 45*  CREATININE 1.94* 1.92*  CALCIUM 10.0 10.1   GFR: Estimated Creatinine Clearance: 29.9 mL/min (A) (by C-G formula based on SCr of 1.92 mg/dL (H)). Liver Function Tests: No results for input(s): AST, ALT, ALKPHOS, BILITOT, PROT, ALBUMIN in the last 168 hours. No results for input(s): LIPASE, AMYLASE in the last 168 hours. No results for input(s): AMMONIA in the last 168 hours. Coagulation Profile: No results for input(s): INR, PROTIME in the last 168 hours. Cardiac Enzymes: Recent Labs  Lab 09/22/20 1051  CKTOTAL 15*   BNP (last 3 results) No results for input(s): PROBNP in the last 8760 hours. HbA1C: No results for input(s): HGBA1C in the last 72 hours. CBG: Recent Labs  Lab 09/27/20 1231 09/27/20 1618 09/27/20 2232 09/28/20 0733 09/28/20 1241  GLUCAP 162* 217* 187* 122* 234*   Lipid Profile: No results for input(s): CHOL, HDL, LDLCALC, TRIG, CHOLHDL, LDLDIRECT in the last 72 hours. Thyroid Function Tests: No results for input(s): TSH, T4TOTAL, FREET4, T3FREE, THYROIDAB in the last 72 hours. Anemia Panel: No results for input(s): VITAMINB12, FOLATE, FERRITIN, TIBC, IRON, RETICCTPCT in the last 72 hours. Sepsis Labs: Recent Labs  Lab 09/22/20 1051 09/23/20 0101 09/24/20 0423  PROCALCITON 0.10 <0.10 0.11    Recent Results (from the past 240 hour(s))  Culture, blood (routine x 2)     Status: None   Collection Time:  09/22/20  8:26 AM   Specimen: BLOOD LEFT HAND  Result Value Ref Range Status   Specimen Description BLOOD LEFT HAND  Final   Special Requests   Final    BOTTLES DRAWN AEROBIC ONLY Blood Culture adequate volume   Culture   Final    NO GROWTH 5 DAYS Performed at [facility], [address] [place], [place] [address]    Report Status 09/27/2020 FINAL  Final  Culture, blood (routine x 2)     Status: None   Collection Time: 09/22/20  8:27 AM   Specimen: BLOOD LEFT ARM  Result Value Ref Range Status   Specimen Description BLOOD LEFT ARM  Final   Special Requests   Final    BOTTLES DRAWN AEROBIC ONLY Blood Culture adequate volume   Culture   Final    NO GROWTH 5 DAYS Performed at [facility], [address], [place], [place] [address]    Report Status 09/27/2020 FINAL  Final  Aspergillus Ag, BAL/Serum  Status: None   Collection Time: 09/23/20  1:01 AM   Specimen: Vein; Blood  Result Value Ref Range Status   Aspergillus Ag, BAL/Serum 0.03 0.00 - 0.49 Index Final    Comment: (NOTE) Performed At: [facility] [place], [place] [id number] Rush Farmer MD [id number]   MRSA PCR Screening     Status: None   Collection Time: 09/23/20  9:30 AM   Specimen: Nasal Mucosa; Nasopharyngeal  Result Value Ref Range Status   MRSA by PCR NEGATIVE NEGATIVE Final    Comment:        The GeneXpert MRSA Assay (FDA approved for NASAL specimens only), is one component of a comprehensive MRSA colonization surveillance program. It is not intended to diagnose MRSA infection nor to guide or monitor treatment for MRSA infections. Performed at [facility], [place] [address] [place], [place] [address]          Radiology Studies: No results found.      Scheduled Meds: . amLODipine  10 mg Oral Daily  . vitamin C  500 mg Oral Daily  . aspirin EC  81 mg Oral Daily  . darbepoetin (ARANESP) injection - NON-DIALYSIS  150 mcg Subcutaneous Q  Tue-1800  . enoxaparin (LOVENOX) injection  30 mg Subcutaneous Q24H  . feeding supplement (NEPRO CARB STEADY)  237 mL Oral BID BM  . fluticasone  1 spray Each Nare Daily  . folic acid  1 mg Oral Daily  . hydrALAZINE  50 mg Oral Q8H  . insulin aspart  0-20 Units Subcutaneous TID WC  . insulin detemir  30 Units Subcutaneous BID  . isosorbide mononitrate  60 mg Oral Daily  . levothyroxine  50 mcg Oral Q0600  . LORazepam  1 mg Oral BID  . mouth rinse  15 mL Mouth Rinse BID  . methylPREDNISolone (SOLU-MEDROL) injection  60 mg Intravenous Q6H  . mycophenolate  360 mg Oral BID  . pantoprazole  40 mg Oral BID  . tacrolimus  3 mg Oral QPM  . tacrolimus  4 mg Oral Daily  . zinc sulfate  220 mg Oral Daily   Continuous Infusions: . sodium chloride    . sodium chloride Stopped (08/31/20 1545)     LOS: 40 days    Time spent: 35 minutes.     Elmarie Shiley, MD [facility]   If 7PM-7AM, please contact night-coverage [email or web address]  09/28/2020, 1:05 PM

## 2020-09-29 ENCOUNTER — Inpatient Hospital Stay (HOSPITAL_COMMUNITY): Payer: HMO

## 2020-09-29 DIAGNOSIS — U071 COVID-19: Secondary | ICD-10-CM | POA: Diagnosis not present

## 2020-09-29 DIAGNOSIS — E1165 Type 2 diabetes mellitus with hyperglycemia: Secondary | ICD-10-CM

## 2020-09-29 DIAGNOSIS — J8410 Pulmonary fibrosis, unspecified: Secondary | ICD-10-CM | POA: Diagnosis not present

## 2020-09-29 DIAGNOSIS — N179 Acute kidney failure, unspecified: Secondary | ICD-10-CM | POA: Diagnosis not present

## 2020-09-29 DIAGNOSIS — R7401 Elevation of levels of liver transaminase levels: Secondary | ICD-10-CM | POA: Diagnosis present

## 2020-09-29 DIAGNOSIS — J189 Pneumonia, unspecified organism: Secondary | ICD-10-CM | POA: Diagnosis not present

## 2020-09-29 DIAGNOSIS — J9621 Acute and chronic respiratory failure with hypoxia: Secondary | ICD-10-CM | POA: Diagnosis not present

## 2020-09-29 LAB — GLUCOSE, CAPILLARY
Glucose-Capillary: 163 mg/dL — ABNORMAL HIGH (ref 70–99)
Glucose-Capillary: 321 mg/dL — ABNORMAL HIGH (ref 70–99)
Glucose-Capillary: 273 mg/dL — ABNORMAL HIGH (ref 70–99)
Glucose-Capillary: 218 mg/dL — ABNORMAL HIGH (ref 70–99)

## 2020-09-29 MED ORDER — INSULIN DETEMIR 100 UNIT/ML ~~LOC~~ SOLN
35.0000 [IU] | Freq: Two times a day (BID) | SUBCUTANEOUS | Status: DC
  Administered 2020-09-29 – 2020-10-01 (×4): 35 [IU] via SUBCUTANEOUS
  Filled 2020-09-29 (×5): qty 0.35

## 2020-09-29 MED ORDER — CLOTRIMAZOLE 2 % VA CREA
1.0000 | TOPICAL_CREAM | Freq: Every day | VAGINAL | Status: AC
  Administered 2020-09-29 – 2020-10-01 (×3): 1 via VAGINAL
  Filled 2020-09-29: qty 21

## 2020-09-29 NOTE — Progress Notes (Signed)
Physical Therapy Treatment Patient Details Name: [name] [name] MRN: [id number] DOB: 08-Jul-1956 Today's Date: 09/29/2020    History of Present Illness Pt is a 65 y.o. female who tested (+) COVID-19 on 08/13/20, now admitted 08/18/20 with worsening SOB, cough, chest pain and body aches. Acute hypoxic respiratory failure due to COVID-19 PNA, DKA, AKI on CKD, UTI. CP on 1/22 with NSTEMI . PMHx: HTN, DM2, chronic steroid use, s/p renal transplant x2.    PT Comments    Pt very pleasant sitting in chair on arrival at 50L /60% FIo2 on HHFNC with SpO2 90%. Pt with continued desaturation with all activity with need for 2-5 min seated recovery between each transfer. Pt fatigued after 3 transfers and unable to perform seated HEP today. Pt remains very motivated and willing to perform all activity but limited by pulmonary function. Will continue to follow.   End of session at 40L, 60% with SpO2 89% and RN aware HR 120 with activity and 110 at rest With 35 sec of standing at 50L/60%with addition of face mask pt with drop to 77% and required 2 min seated rest to return to 89%  With pivot chair<>BSC drop to 73% when Rn attempted titration to 35L without any recovery in 2 min and return to 45L with additional 2 min to return to 88% RR 35-44   Follow Up Recommendations  Supervision/Assistance - 24 hour;LTACH     Equipment Recommendations  3in1 (PT);Hospital bed;Other (comment);Wheelchair (measurements PT)    Recommendations for Other Services       Precautions / Restrictions Precautions Precautions: Fall Precaution Comments: Watch SpO2 - quick to desaturate on HHFNC, use additional face mask with any mobility    Mobility  Bed Mobility               General bed mobility comments: in chair on arrival and end of session    Transfers Overall transfer level: Needs assistance   Transfers: Sit to/from Stand;Stand Pivot Transfers Sit to Stand: Min assist Stand pivot transfers: Min  assist       General transfer comment: cues for hand placement with assist for anterior translation and rise from surface to push up from recliner to stand and maintain static standing 35 sec. pt then pivoted Recliner<>BSC with use of RW and cues for safety and sequence with continued assist to rise from surface  Ambulation/Gait             General Gait Details: not yet able   Stairs             Wheelchair Mobility    Modified Rankin (Stroke Patients Only)       Balance Overall balance assessment: Needs assistance   Sitting balance-Leahy Scale: Fair Sitting balance - Comments: edge of chair and BSC without UE support   Standing balance support: Bilateral upper extremity supported Standing balance-Leahy Scale: Poor Standing balance comment: bil UE support on RW in standing                            Cognition Arousal/Alertness: Awake/alert Behavior During Therapy: WFL for tasks assessed/performed Overall Cognitive Status: Impaired/Different from baseline                           Safety/Judgement: Decreased awareness of deficits     General Comments: decreased awareness of true medical complexity and deficits      Exercises  General Comments        Pertinent Vitals/Pain Pain Assessment: No/denies pain    Home Living                      Prior Function            PT Goals (current goals can now be found in the care plan section) Acute Rehab PT Goals Time For Goal Achievement: 11/05/2020 Potential to Achieve Goals: Fair Additional Goals Additional Goal #1: Pt will be able to static stand for 30 sec with SpO2 >85% Additional Goal #2: Pt will be able to inspire 1000 mL on incentive spirometer. Progress towards PT goals: Progressing toward goals (slowly)    Frequency    Min 2X/week      PT Plan Current plan remains appropriate    Co-evaluation              AM-PAC PT "6 Clicks" Mobility    Outcome Measure  Help needed turning from your back to your side while in a flat bed without using bedrails?: A Little Help needed moving from lying on your back to sitting on the side of a flat bed without using bedrails?: A Little Help needed moving to and from a bed to a chair (including a wheelchair)?: A Little Help needed standing up from a chair using your arms (e.g., wheelchair or bedside chair)?: A Little Help needed to walk in hospital room?: A Lot Help needed climbing 3-5 steps with a railing? : Total 6 Click Score: 15    End of Session Equipment Utilized During Treatment: Oxygen Activity Tolerance: Patient tolerated treatment well;Patient limited by fatigue Patient left: in chair;with call bell/phone within reach;with chair alarm set Nurse Communication: Mobility status;Precautions PT Visit Diagnosis: Other abnormalities of gait and mobility (R26.89);Muscle weakness (generalized) (M62.81)     Time: 6468-0321 PT Time Calculation (min) (ACUTE ONLY): 32 min  Charges:  $Therapeutic Activity: 23-37 mins                     Basilio Meadow P, PT Acute Rehabilitation Services Pager: [phone] Office: [place] 09/29/2020, 1:05 PM

## 2020-09-29 NOTE — Progress Notes (Signed)
[facility] PROGRESS NOTE  [name] [name] EPP:[id number] DOB: 1955/10/01 DOA: 08/18/2020 PCP: Glendale Chard, MD  Status: Remains inpatient appropriate because:Unsafe d/c plan, IV treatments appropriate due to intensity of illness or inability to take PO and Inpatient level of care appropriate due to severity of illness   Dispo:  Patient From: Home  Planned Disposition: [place]  Expected discharge date: 10/03/2020  Medically stable for discharge:    Difficult to place: Yes; Barriers to DC: Continues to require very high levels of oxygen secondary to Covid lung injury.  She is physically deconditioned and requires SNF for rehabilitative therapies but given high level of oxygen requirements not a candidate for SNF at this time.  2/15 [facility] -can't accept until FiO2 </= 60% as of 2/21 FiO2 at 60%.  [facility] regarding request for [facility]   Level of care: Progressive  Code Status: DNR  Family Communication: Patient; daughter Mendel Ryder 2/21.   DVT prophylaxis: Lovenox Vaccination status: Fully vaccinated against Covid prior to admission.  Initial vaccine November 2021 with follow-up vaccines March and April 2021  Foley catheter: No  HPI: 68 old female with history of renal transplant x2 on chronic immunosuppression agents, diabetes, hypothyroidism who presented with shortness of breath initially.  She was found to be in acute hypoxic respiratory failure due to Covid pneumonia.  Hospital course was prolonged and was complicated by slow improvement from severe hypoxia, AKI and non-STEMI.  Continues to require high flow oxygen.  Patient was a DNR until 2/10 at which point she changed back to full code. Further discussions held w/ pt and dtr and code status changed back to DNR on 2/12   Subjective: Alert and sitting up in chair at bedside.  Complaining of being cold.  Increased work of breathing and tachypnea.  Patient  instructed in how to take slow deep breaths and through nose and out through mouth.  This decreased work of breathing and increased pulse oximetry reading.  Complaining of bottom discomfort.  Daughter at bedside and questions answered regarding LTAC  Objective: Vitals:   09/28/20 2351 09/29/20 0400  BP:  (!) 159/59  Pulse:  92  Resp: 19 (!) 23  Temp:  (!) 97.4 F (36.3 C)  SpO2: (!) 89% 93%   No intake or output data in the 24 hours ending 09/29/20 0722 Filed Weights   09/22/20 0534 09/25/20 0458 09/26/20 0311  Weight: 83.3 kg 86.8 kg 86.8 kg    Exam:  General: Awake and alert, mild distress as evidenced by increased work of breathing that improved with breathing exercises. Respiratory: Bilateral lung sounds clear to auscultation anteriorly with increased work of breathing and improved with breathing exercises.  HHFNC 60% FiO2 with O2 sats is 93 to 94% Cardiovascular: Normal heart sounds, no peripheral edema, pulses regular and tachycardic. Abdomen: Tender.  Normoactive bowel sounds.  Still with marginal oral intake.  LBM 2/20 Neurologic: CN 2-12 grossly intact. Sensation intact, DTR normal. Strength 3-4/5 x all 4 extremities.  Psychiatric: Awake and alert.  Oriented x3.  Pleasant affect.   Assessment/Plan: Acute problems: Sepsis /acute on chronic hypoxic respiratory failure due to Covid pneumonia/new hospital associated pneumonia as of 2/14:  Fully vaccinated patient.  Immunocompromised status due to renal transplant, immunosuppressive medications.   Presented with multifocal pneumonia from Covid.  Completed treatment with IV steroids, baricitinib, remdesivir.  Also was treated with IV antibiotics for superimposed bacterial pneumonia.   Despite aggressive measures, she remains severely hypoxic  likely secondary to irreversible pulmonary fibrosis secondary to Covid pneumonitis based on recent CT of the chest.   Continue pulmonary toileting, incentive spirometer/flutter valve.    Continue scheduled Ativan every 12 hours and IV Dilaudid 0.5 mg every 4 hours prn for air hunger and associated anxiety-patient has not been utilizing IV Dilaudid for air hunger Completed 7 days of cefepime  Continue Solumedrol 60 mg IV q 6hrs-taper at discretion of PCCM CRP trend: 5.2> 6.3 > 2.4 with an LDH of 253 PCCM following. Patient too sick/stressed to tolerate bronchoscopy.    CT Chest 2/16: Spectrum of findings compatible with evolving severe postinflammatory fibrosis due to history of COVID-19 pneumonia. Diffuse thickening of the peribronchovascular interstitium with associated moderate traction bronchiectasis, volume loss and distortion, largely new since 08/24/2020 chest CT. Persistent patchy ground-glass opacity throughout both lungs, significantly decreased in the interval. Suggest follow-up high-resolution chest CT study in 3-6 months.  This is likely related to progressive pulmonary fibrosis COVID D-dimer slightly elevated- lower extremity venous duplex w/o DVT CMV PCR negative, serum crypto antigen neg, Fungitell negative, Aspergillus antigen negative Imaging not consistent with pneumocystis jirovecii   Yeast vaginitis -Likely secondary to recent antibiotics, steroids and resultant -Begin intravaginal antifungals  AKI on CKD stage IV/transplant kidney:  Transplant x2.  AKI was most likely hemodynamically mediated.   Creatinine has remained acutely around 1.7.   Nephrology has signed off  Continue Prograf and Myfortic  Prednisone on hold in favor of IV Solu-Medrol-will taper at discretion of PCCM Avoid nephrotoxic medication Previous hyperkalemia resolved  DKA/diabetes mellitus on insulin with hyperglycemia DKA resolved.  No longer on insulin drip.   2/21 persistent hyperglycemia w/ steroids so will increase Levemir to 35 units and continue SSI to resistent CBGs have been between 196 and 234 over the past 24-hours Hemoglobin A1c of 8.2 as per 1/11.    Sinus  pause: Happened on  1/20 with 4.5 seconds pause.   Beta-blocker discontinued.  Currently stable.  NSTEMI:  Patient with chest pain on 1/23 with EKG changes.   Treated with Nitroglycerin, morphine and beta-blocker.   Echo is reassuring with preserved ejection fraction.   Continue aspirin/statin completed 48 hours of IV heparin.   Cardiology signed off.  Chronic normocytic anemia:  Most likely associated  with CKD.  No signs of bleeding.   Status post 1 unit of PRBC transfusion on 09/11/2020.   Continue monitoring hemoglobin intermittently  Deconditioning/debility:  PT/OT recommended skilled nursing facility on discharge but current O2 requirements are too high to facilitate discharge to skilled nursing facility. LTAC reviewed chart but FiO2 must be </= 60%  Stage I decubitus coccyx Pressure Injury 08/20/20 Coccyx Mid Stage 1 -  Intact skin with non-blanchable redness of a localized area usually over a bony prominence. (Active)  Date First Assessed/Time First Assessed: 08/20/20 0800   Location: Coccyx  Location Orientation: Mid  Staging: Stage 1 -  Intact skin with non-blanchable redness of a localized area usually over a bony prominence.  Present on Admission: Yes    Assessments 08/20/2020  9:00 AM 09/28/2020  8:40 PM  Dressing Type Foam - Lift dressing to assess site every shift Foam - Lift dressing to assess site every shift  Dressing Changed Clean;Dry  Dressing Change Frequency Every 3 days Every 3 days  Site / Wound Assessment Clean;Dry;Pink --  Wound Length (cm) 5 cm --  Wound Width (cm) 0.1 cm --  Wound Depth (cm) 0 cm --  Wound Surface Area (cm^2) 0.5 cm^2 --  Wound Volume (cm^3) 0 cm^3 --  Drainage Amount None --  Treatment Cleansed;Off loading --     No Linked orders to display     Wound / Incision (Open or Dehisced) 08/31/20 Puncture Abdomen Left;Lower From Heparin SQ injections. (Active)  Date First Assessed/Time First Assessed: 08/31/20 2000   Wound Type: Puncture   Location: Abdomen  Location Orientation: Left;Lower  Wound Description (Comments): From Heparin SQ injections.  Present on Admission: No    Assessments 08/31/2020  8:00 PM 09/25/2020  8:30 AM  Wound Length (cm) 0 cm --  Wound Width (cm) 0 cm --  Wound Depth (cm) 0 cm --  Wound Volume (cm^3) 0 cm^3 --  Wound Surface Area (cm^2) 0 cm^2 --  Treatment -- Cleansed     No Linked orders to display       Other problems: Transaminitis:  Resolved likely secondary to COVID meds as well as hypoperfusion  Right upper quadrant ultrasound is nonacute and showed some sludge/gallstones but no signs of cholecystitis.   Hepatitis panel negative.    Thrombocytopenia:  Most likely associated with acute illness.  Continue to monitor,stable  Complicated UTI:  Completed course of Rocephin.  Hypothyroidism:  Continue Synthyroid.   Repeat TFT in 3 months  Hypertension:  Currently blood pressure stable.  Continue medications  Hyperlipidemia:  Statin held due to elevated LFTs.   LFTs have normalized so can consider resumption of statin although oral intake remains variable  Obesity:  BMI 33.9  Nutrition Status: Estimated body mass index is 35 kg/m as calculated from the following:   Height as of this encounter: 5\' 2"  (1.575 m).   Weight as of this encounter: 86.8 kg.  Poor oral intake 2/2 air hunger          Data Reviewed: Basic Metabolic Panel: Recent Labs  Lab 09/24/20 0423 09/27/20 0126  NA 135 139  K 4.8 4.2  CL 105 109  CO2 21* 20*  GLUCOSE 332* 137*  BUN 29* 45*  CREATININE 1.94* 1.92*  CALCIUM 10.0 10.1   Liver Function Tests: No results for input(s): AST, ALT, ALKPHOS, BILITOT, PROT, ALBUMIN in the last 168 hours. No results for input(s): LIPASE, AMYLASE in the last 168 hours. No results for input(s): AMMONIA in the last 168 hours. CBC: Recent Labs  Lab 09/24/20 0423 09/27/20 0126  WBC 5.6 7.3  HGB 10.1* 10.6*  HCT 31.0* 31.5*  MCV 83.1 81.0  PLT 136*  143*   Cardiac Enzymes: Recent Labs  Lab 09/22/20 1051  CKTOTAL 15*   BNP (last 3 results) Recent Labs    09/11/20 0155 09/12/20 0113 09/15/20 0028  BNP 86.8 80.1 80.0    ProBNP (last 3 results) No results for input(s): PROBNP in the last 8760 hours.  CBG: Recent Labs  Lab 09/27/20 2232 09/28/20 0733 09/28/20 1241 09/28/20 1552 09/28/20 2103  GLUCAP 187* 122* 234* 202* 167*    Recent Results (from the past 240 hour(s))  Culture, blood (routine x 2)     Status: None   Collection Time: 09/22/20  8:26 AM   Specimen: BLOOD LEFT HAND  Result Value Ref Range Status   Specimen Description BLOOD LEFT HAND  Final   Special Requests   Final    BOTTLES DRAWN AEROBIC ONLY Blood Culture adequate volume   Culture   Final    NO GROWTH 5 DAYS Performed at [facility], [place] [address], [place], [place] [address]    Report Status 09/27/2020 FINAL  Final  Culture, blood (routine x 2)     Status: None   Collection Time: 09/22/20  8:27 AM   Specimen: BLOOD LEFT ARM  Result Value Ref Range Status   Specimen Description BLOOD LEFT ARM  Final   Special Requests   Final    BOTTLES DRAWN AEROBIC ONLY Blood Culture adequate volume   Culture   Final    NO GROWTH 5 DAYS Performed at [facility], [address], [place], [place] [address]    Report Status 09/27/2020 FINAL  Final  Aspergillus Ag, BAL/Serum     Status: None   Collection Time: 09/23/20  1:01 AM   Specimen: Vein; Blood  Result Value Ref Range Status   Aspergillus Ag, BAL/Serum 0.03 0.00 - 0.49 Index Final    Comment: (NOTE) Performed At: [facility] [place], [place] [id number] Rush Farmer MD [id number]   MRSA PCR Screening     Status: None   Collection Time: 09/23/20  9:30 AM   Specimen: Nasal Mucosa; Nasopharyngeal  Result Value Ref Range Status   MRSA by PCR NEGATIVE NEGATIVE Final    Comment:        The GeneXpert MRSA Assay (FDA approved for NASAL specimens only),  is one component of a comprehensive MRSA colonization surveillance program. It is not intended to diagnose MRSA infection nor to guide or monitor treatment for MRSA infections. Performed at [facility], [place] [address], [place], [place] [address]      Studies: No results found.  Scheduled Meds: . amLODipine  10 mg Oral Daily  . vitamin C  500 mg Oral Daily  . aspirin EC  81 mg Oral Daily  . darbepoetin (ARANESP) injection - NON-DIALYSIS  150 mcg Subcutaneous Q Tue-1800  . enoxaparin (LOVENOX) injection  30 mg Subcutaneous Q24H  . feeding supplement (NEPRO CARB STEADY)  237 mL Oral BID BM  . fluticasone  1 spray Each Nare Daily  . folic acid  1 mg Oral Daily  . hydrALAZINE  50 mg Oral Q8H  . insulin aspart  0-20 Units Subcutaneous TID WC  . insulin detemir  30 Units Subcutaneous BID  . isosorbide mononitrate  60 mg Oral Daily  . levothyroxine  50 mcg Oral Q0600  . LORazepam  1 mg Oral BID  . mouth rinse  15 mL Mouth Rinse BID  . methylPREDNISolone (SOLU-MEDROL) injection  60 mg Intravenous Q6H  . mycophenolate  360 mg Oral BID  . pantoprazole  40 mg Oral BID  . tacrolimus  3 mg Oral QPM  . tacrolimus  4 mg Oral Daily  . zinc sulfate  220 mg Oral Daily   Continuous Infusions: . sodium chloride    . sodium chloride Stopped (08/31/20 1545)    Principal Problem:   ARF (acute renal failure) (HCC) Active Problems:   AKI (acute kidney injury) ([place])   Fever, unspecified   Renal transplant recipient   Immunocompromised ([place])   DM (diabetes mellitus), type 2 with renal complications ([place])   Essential hypertension with goal blood pressure less than 140/90   Acute hypoxemic respiratory failure due to COVID-19 ([place])   Respiratory failure (HCC)   Acute hyperkalemia   Diabetic ketoacidosis without coma associated with type 1 diabetes mellitus ([place])   Pressure injury of skin   Acute and chronic respiratory failure with hypoxia ([place])   HCAP (healthcare-associated  pneumonia)   Pulmonary fibrosis, postinflammatory ([place])   Consultants:  None  Procedures:  Echocardiogram  Antibiotics: Anti-infectives (From admission, onward)   Start     Dose/Rate Route Frequency Ordered Stop   09/24/20 1200  vancomycin (VANCOREADY) IVPB 1000 mg/200 mL  Status:  Discontinued        1,000 mg 200 mL/hr over 60 Minutes Intravenous Every 48 hours 09/22/20 1036 09/24/20 1013   09/22/20 1130  vancomycin (VANCOREADY) IVPB 1750 mg/350 mL        1,750 mg 175 mL/hr over 120 Minutes Intravenous  Once 09/22/20 1036 09/22/20 1735   09/22/20 1130  ceFEPIme (MAXIPIME) 2 g in sodium chloride 0.9 % 100 mL IVPB        2 g 200 mL/hr over 30 Minutes Intravenous Every 24 hours 09/22/20 1036 09/28/20 0052   09/07/20 1030  vancomycin (VANCOCIN) IVPB 1000 mg/200 mL premix  Status:  Discontinued        1,000 mg 200 mL/hr over 60 Minutes Intravenous Every 48 hours 09/05/20 0933 09/07/20 0951   09/05/20 1030  vancomycin (VANCOREADY) IVPB 2000 mg/400 mL        2,000 mg 200 mL/hr over 120 Minutes Intravenous  Once 09/05/20 0933 09/05/20 2018   09/05/20 1030  ceFEPIme (MAXIPIME) 2 g in sodium chloride 0.9 % 100 mL IVPB  Status:  Discontinued        2 g 200 mL/hr over 30 Minutes Intravenous Every 24 hours 09/05/20 0933 09/07/20 0951   08/20/20 1445  cefTRIAXone (ROCEPHIN) 1 g in sodium chloride 0.9 % 100 mL IVPB        1 g 200 mL/hr over 30 Minutes Intravenous Every 24 hours 08/20/20 1348 08/26/20 2147   08/20/20 1000  remdesivir 100 mg in sodium chloride 0.9 % 100 mL IVPB       "Followed by" Linked Group Details   100 mg 200 mL/hr over 30 Minutes Intravenous Daily 08/19/20 0021 08/23/20 1113   08/19/20 0130  remdesivir 200 mg in sodium chloride 0.9% 250 mL IVPB       "Followed by" Linked Group Details   200 mg 580 mL/hr over 30 Minutes Intravenous Once 08/19/20 0021 08/19/20 0314       Time spent: 30 minutes    Erin Hearing ANP  [facility] 7 am - 330 pm/M-F for  direct patient care and secure chat Please refer to Amion for contact info 41  days

## 2020-09-29 NOTE — Progress Notes (Signed)
Pt remains RED MEWS due to RR 30's-40's and HR (Sinus Tach) Pts baseline with hospital condition has been at this. MD is aware and Critical Care management is aware and following. No interventions ordered today. Pt has increased oxygen needs. Pt reports no distress or increased shortness of breath.

## 2020-09-29 NOTE — Progress Notes (Addendum)
NAME:  [name] [name], MRN:  [id number], DOB:  July 22, 1956, LOS: 24 ADMISSION DATE:  08/18/2020, CONSULTATION DATE:  1/10 REFERRING MD:  Hiram Comber, CHIEF COMPLAINT:  Dyspnea   Brief History:  65 year old immunocompromised female admitted with COVID PNA 1/20 after testing positive on 12/29. She was admitted to the ICU where she required HHFNC O2 while receiving treatment for COVID-19.  Oxygenation improved but patient has been difficult to discharge due to her COVID status and other complicating factors.  PCCM reconsulted 1/28 for worsening hypoxemic respiratory failure (made DNR). PCCM consulted again on 2/15 due to persistent hypoxemic respiratory failure.    Past Medical History:  CKD, s/p renal transplant x 2 (on Prograf, Myfortic) Gout due to renal transplant medications Hyperlipidemia Hypertension Sickle cell trait Thyroid disease Type II diabetes UTI  Significant Hospital Events:  1/10 Admitted [place] [facility] 1/11 ICU and CCM 1/14 To [facility] >> 5W 1/28 PCCM reconsult for hypoxemic respiratory failure 2/12 DNR status updated 2/15 PCCM reconsult for persistent hypoxemic respiratory failure  Consults:  Nephrology  PCCM  Procedures:    Significant Diagnostic Tests:   1/11 Renal US >> Elevated resistive indices within the left lower quadrant transplant kidney, no hydronephrosis.  1/16 CT Chest > images personally reviewed> bilateral ground glass opacifications with pleural based nodule in left lower lobe, some interlobular pleural thickening  1/25 Echo> LVEF > 75%, mod LVH, RV systolic function normal, mitral valve normal, aortic valve is tricuspid  1/28 CXR Progression of diffuse bilateral pulmonary opacities  2/17 CT Chest > less GGO compared to previous scan, more interlobular septal thickening and traction bronchiectasis concerning for progressive fibrosis  Micro Data:  1/12 MRSA >> negative 1/10 BCx2 >> negative 1/11 UCx >> klebsiella pneumoniae 2/14 BCx >>  negative   Antimicrobials:  Ceftriaxone 1/12 >> 1/18 Vanc 1/28 (x 1), 2/14 Cefepime 1/28 >> 1/29, 2/14 >> 2/20  COVID Tx: Remdesivir 1/10 >>1/12 Dexamethasone 1/11 >> 1/13 Solumedrol 1/14 >> 1/23, 2/14 >> Pred 20mg  1/14 >> 2/5 Pred 5mg  2/6 >> 2/14 Baricitinib 1/16 >> 1/29  Interim History / Subjective:  Reports that her breathing feels "a little better" today Significant conversational dyspnea Remains on HHFNC 50L/60% Endorses SOB, denies CP/nausea Reports [facility] may be going [facility] today, daughter just left prior to my arrival  Objective   Blood pressure (!) 179/78, pulse (!) 107, temperature (!) 97.5 F (36.4 C), temperature source Oral, resp. rate (!) 25, height 5\' 2"  (1.575 m), weight 86.8 kg, SpO2 93 %.    FiO2 (%):  [60 %] 60 %  No intake or output data in the 24 hours ending 09/29/20 1007 Filed Weights   09/22/20 0534 09/25/20 0458 09/26/20 0311  Weight: 83.3 kg 86.8 kg 86.8 kg   Examination: General: Chronically ill-appearing woman, up to chair at bedside, appears uncomfortable 2/2 dyspnea. HEENT: Anicteric sclera, moist mucous membranes. Neuro: A&Ox4, answering questions appropriately (though short, partial sentences 2/2 dyspnea). Able to follow commands. Moves all 4 extremities spontaneously. CV: Tachycardic, regular rhythm. PULM: Tachypneic, breathing mildly labored on HHFNC (50L/60%). Faint bilateral rales. GI: Soft, nontender, nondistended. Extremities: Trace BLE edema. Skin: Warm/dry, no rashes noted.  Resolved Hospital Problem list     Assessment & Plan:  Acute respiratory failure with hypoxemia initially due to COVID 19 pneumonia, now with persistent hypoxemia.  Suspect that she has COVID related fibrotic lung disease as her primary problem based on repeat CT scan. Given her immunocompromised status, she potentially has a superimposed bacterial HCAP causing acute  worsening.  Opportunistic organisms are considered, but unfortunately she is too sick for  a bronchoscopy currently. D-dimer minimally elevated, PE seems less likely. Negative BLE [place] 2/15. CMV PCR low, unlikely CMV. Serum Crypto/Aspergillus negative, Fungitell 34. - Continue Solumedrol - Repeat CXR today (last 2/16) - Wean supplemental O2 as able for sats > 90% - Pulmonary hygiene with IS, flutter, mobilization - S/p 7-day course of broad spectrum antibiotics (Cefepime end 2/20) - Ongoing concern for progressive fibrosis r/t COVID infectionas cause of increased O2 needs/lack of clinical improvement - Plan remains eventual transfer to [facility]), though uncertain if patient is stable enough for safe transfer at this point given continued significant O2 needs and clinical status  Critical Care Time:   Rhae Lerner [facility] 09/29/20 10:09 AM  Please see [email or web address] for pager details.

## 2020-09-29 NOTE — TOC Progression Note (Addendum)
Transition of Care [facility]) - Progression Note    Patient Details  Name: [name] [name] MRN: [id number] Date of Birth: 12/10/1955  Transition of Care [facility]) CM/SW [place], RN Phone Number: 09/29/2020, 7:53 AM  Clinical Narrative:    Case management left a message with Anderson Malta, Wallace with [facility] to inquire about admission opportunity at the facility.  Will follow up with the facility.  09/29/20 - 4097-  Spoke with Arley Phenix with [facility] and she is agreeable to review the patient's clinicals for possible admission to LTAC at the facility.  CM and MSW will continue to follow for possible admission at [facility].  09/29/2020 1000 - Spoke with Arley Phenix, CM at [facility] and they are able to offer the patient an admission bed if insurance approves admission.  I spoke with Ferd Hibbs, daughter and offered her choice regarding LTAC and as above note explains - daughter was agreeable to admission to [facility] versus [facility].    I called [employer] and spoke with Tammy, [facility] and insurance authorization was started for LTAC pending admission to the facility.  No PTAR transport authorization will be needed.   CM and MSW to follow up regarding insurance authorization to the facility.  09/29/2020 1352-  Called and spoke with [employer] and they are going to follow up with me regarding sending a pre-authorization form.  I also spoke with Arley Phenix, CM with [facility] and the facility does not have an available admission bed today but should have an admission bed available in the LTAC in the next 1-2 days.  09/29/2020 1445 - Called and spoke with Colletta Maryland with [employer] and she asked that the [facility] initiate insurance authorization through the [employer] for prior authorization and submit clinicals as necessary.  I called and  spoke with Arley Phenix, CM with [facility] and she plans to follow up regarding prior authorization.  CM and MSW will continue to follow up for LTAC admission to [facility].   Expected Discharge Plan: [place] Barriers to Discharge: Continued Medical Work up  Expected Discharge Plan and Services Expected Discharge Plan: [place] In-house Referral: Clinical Social Work Discharge Planning Services: CM Consult Post Acute Care Choice: [place] Living arrangements for the past 2 months: Single Family Home                                       Social Determinants of Health (SDOH) Interventions    Readmission Risk Interventions Readmission Risk Prevention Plan 09/19/2020  Transportation Screening Complete  Medication Review Press photographer) Complete  PCP or Specialist appointment within 3-5 days of discharge Complete  HRI or Home Care Consult Complete  SW Recovery Care/Counseling Consult Complete  Palliative Care Screening Complete  Skilled Nursing Facility Complete  Some recent data might be hidden

## 2020-09-30 DIAGNOSIS — E1165 Type 2 diabetes mellitus with hyperglycemia: Secondary | ICD-10-CM | POA: Diagnosis not present

## 2020-09-30 DIAGNOSIS — U071 COVID-19: Secondary | ICD-10-CM | POA: Diagnosis not present

## 2020-09-30 DIAGNOSIS — D849 Immunodeficiency, unspecified: Secondary | ICD-10-CM | POA: Diagnosis not present

## 2020-09-30 DIAGNOSIS — J9621 Acute and chronic respiratory failure with hypoxia: Secondary | ICD-10-CM | POA: Diagnosis not present

## 2020-09-30 LAB — GLUCOSE, CAPILLARY
Glucose-Capillary: 184 mg/dL — ABNORMAL HIGH (ref 70–99)
Glucose-Capillary: 147 mg/dL — ABNORMAL HIGH (ref 70–99)
Glucose-Capillary: 227 mg/dL — ABNORMAL HIGH (ref 70–99)
Glucose-Capillary: 276 mg/dL — ABNORMAL HIGH (ref 70–99)
Glucose-Capillary: 239 mg/dL — ABNORMAL HIGH (ref 70–99)
Glucose-Capillary: 132 mg/dL — ABNORMAL HIGH (ref 70–99)

## 2020-09-30 LAB — CBC
HCT: 36 % (ref 36.0–46.0)
Hemoglobin: 11.8 g/dL — ABNORMAL LOW (ref 12.0–15.0)
MCH: 27.1 pg (ref 26.0–34.0)
MCHC: 32.8 g/dL (ref 30.0–36.0)
MCV: 82.6 fL (ref 80.0–100.0)
Platelets: 190 10*3/uL (ref 150–400)
RBC: 4.36 MIL/uL (ref 3.87–5.11)
RDW: 22.2 % — ABNORMAL HIGH (ref 11.5–15.5)
WBC: 12.1 10*3/uL — ABNORMAL HIGH (ref 4.0–10.5)
nRBC: 1.1 % — ABNORMAL HIGH (ref 0.0–0.2)

## 2020-09-30 NOTE — Progress Notes (Signed)
Inpatient Diabetes Program Recommendations  AACE/ADA: New Consensus Statement on Inpatient Glycemic Control   Target Ranges:  Prepandial:   less than 140 mg/dL      Peak postprandial:   less than 180 mg/dL (1-2 hours)      Critically ill patients:  140 - 180 mg/dL  Results for [name] [name], [name] [name] (MRN [id number]) as of 09/30/2020 10:36  Ref. Range 09/29/2020 08:55 09/29/2020 12:38 09/29/2020 16:44 09/29/2020 20:10 09/30/2020 04:30 09/30/2020 07:40  Glucose-Capillary Latest Ref Range: 70 - 99 mg/dL 163 (H) 321 (H) 273 (H) 218 (H) 184 (H) 147 (H)    Review of Glycemic Control  Current orders for Inpatient glycemic control: Levemir 35 units BID, Novolog 0-20 units TID with meals; Solumedrol 60 mg Q6H  Inpatient Diabetes Program Recommendations:    Insulin: If steroids are continued, please consider ordering Novolog 4 units TID with meals for meal coverage if patient eats at least 50% of meals.  Thanks, Barnie Alderman, RN, MSN, CDE Diabetes Coordinator Inpatient Diabetes Program [phone] (Team Pager from 8am to 5pm)

## 2020-09-30 NOTE — Progress Notes (Addendum)
Unfortunately over the past 24 hours patient had increased work of breathing on lower FiO2 and now O2 is back up to 80% improvement in vital signs including decreased heart rate and decreased respiratory rate.  At this time plans for discharge to [facility] are on hold.  I am concerned that patient's Covid related severe pulmonary fibrosis is declaring itself and it is likely patient's lung function may not improve.  Please see notes from PCCM.  If unable to wean FiO2 may need to involve palliative medicine and begin to discuss goals of care

## 2020-09-30 NOTE — Progress Notes (Signed)
Occupational Therapy Treatment Patient Details Name: [name] [name] MRN: [id number] DOB: June 28, 1956 Today's Date: 09/30/2020    History of present illness Pt is a 65 y.o. female who tested (+) COVID-19 on 08/13/20, now admitted 08/18/20 with worsening SOB, cough, chest pain and body aches. Acute hypoxic respiratory failure due to COVID-19 PNA, DKA, AKI on CKD, UTI. CP on 1/22 with NSTEMI . PMHx: HTN, DM2, chronic steroid use, s/p renal transplant x2.   OT comments  Pt with recent transfer back to bed, desats to 70s with addition of NRB and difficulty recovering, so OT session focused on UE HEP via theraband at bed level. OT assisted in anchoring theraband to allow for energy conservation and ease of exercises. Pt cued for controlled breathing throughout and able to perform 2/4 exercises before fatiguing. Pt reports soreness on bottom, repositioned in bed on side with assistance from NT. Pt reports disappointment in delay for LTACH admission due to increased FiO2 requirements and still reports a desire to be able to rehab to independence.    Pt received on 40 L HHFNC, FiO2 80%. With exercises, HR sustained 110-116bpm, SpO2 desat to 86% but able to recover within 1 minute to 90%. RR up to 44 during session. Reinforced pursed lip breathing, energy conservation and relaxation strategies.    Follow Up Recommendations  SNF;LTACH;Supervision/Assistance - 24 hour    Equipment Recommendations  3 in 1 bedside commode;Hospital bed;Wheelchair (measurements OT);Wheelchair cushion (measurements OT)    Recommendations for Other Services      Precautions / Restrictions Precautions Precautions: Fall Precaution Comments: Watch SpO2 - quick to desaturate on HHFNC, use additional face mask with any mobility Restrictions Weight Bearing Restrictions: No       Mobility Bed Mobility                    Transfers                      Balance                                            ADL either performed or assessed with clinical judgement   ADL                                               Vision   Vision Assessment?: No apparent visual deficits   Perception     Praxis      Cognition Arousal/Alertness: Awake/alert Behavior During Therapy: WFL for tasks assessed/performed Overall Cognitive Status: Impaired/Different from baseline Area of Impairment: Safety/judgement;Awareness                         Safety/Judgement: Decreased awareness of deficits   Problem Solving: Requires verbal cues General Comments: decreased awareness of true medical complexity and deficits        Exercises General Exercises - Upper Extremity Shoulder Flexion: AAROM;AROM;Strengthening;Both;10 reps;Theraband Theraband Level (Shoulder Flexion): Level 1 (Yellow) Shoulder Horizontal ABduction: AROM;AAROM;Strengthening;Both;10 reps;Theraband Theraband Level (Shoulder Horizontal Abduction): Level 1 (Yellow)   Shoulder Instructions       General Comments Session focused on bed level UE exercises. Pt had recently transferred back to bed from chair with desats to 70s even with NRB/HHFNC  per RN    Pertinent Vitals/ Pain       Pain Assessment: Faces Faces Pain Scale: Hurts little more Pain Location: sacrum Pain Descriptors / Indicators: Sore Pain Intervention(s): Monitored during session;Repositioned;Other (comment) (NT and OT scooting pt up in bed and repositioned on side with pillow)  Home Living                                          Prior Functioning/Environment              Frequency  Min 2X/week        Progress Toward Goals  OT Goals(current goals can now be found in the care plan section)  Progress towards OT goals: OT to reassess next treatment  Acute Rehab OT Goals Patient Stated Goal: to breathe better, be able to get up and get to chair, walk to bathroom, do what I was doing  before OT Goal Formulation: With patient Time For Goal Achievement: 10/08/20 Potential to Achieve Goals: Good ADL Goals Pt Will Perform Grooming: with set-up;sitting Pt Will Perform Lower Body Bathing: with min assist;sit to/from stand Pt Will Perform Upper Body Dressing: with min assist;sitting Pt Will Perform Lower Body Dressing: with mod assist;sit to/from stand Pt Will Transfer to Toilet: with min guard assist;stand pivot transfer;bedside commode Pt Will Perform Toileting - Clothing Manipulation and hygiene: with min assist;sit to/from stand Pt/caregiver will Perform Home Exercise Program: Increased strength;Both right and left upper extremity;With written HEP provided;Independently;With theraband Additional ADL Goal #1: Pt will independently demonstrate carryover of energy conservation techniques during ADL/functional task.  Plan Discharge plan remains appropriate    Co-evaluation                 AM-PAC OT "6 Clicks" Daily Activity     Outcome Measure   Help from another person eating meals?: None Help from another person taking care of personal grooming?: A Little Help from another person toileting, which includes using toliet, bedpan, or urinal?: A Lot Help from another person bathing (including washing, rinsing, drying)?: A Lot Help from another person to put on and taking off regular upper body clothing?: A Little Help from another person to put on and taking off regular lower body clothing?: A Lot 6 Click Score: 16    End of Session Equipment Utilized During Treatment: Oxygen  OT Visit Diagnosis: Unsteadiness on feet (R26.81)   Activity Tolerance Patient limited by fatigue   Patient Left in bed;with call bell/phone within reach;with bed alarm set   Nurse Communication Mobility status;Other (comment) (O2)        Time: 1440-1510 OT Time Calculation (min): 30 min  Charges: OT General Charges $OT Visit: 1 Visit OT Treatments $Therapeutic Activity: 8-22  mins $Therapeutic Exercise: 8-22 mins  Malachy Chamber, OTR/L Acute Rehab Services Office: [phone]   Layla Maw 09/30/2020, 3:23 PM

## 2020-09-30 NOTE — Progress Notes (Signed)
[facility] PROGRESS NOTE  [name] [name] MPN:[id number] DOB: 14-Nov-1955 DOA: 08/18/2020 PCP: Glendale Chard, MD  Status: Remains inpatient appropriate because:Unsafe d/c plan, IV treatments appropriate due to intensity of illness or inability to take PO and Inpatient level of care appropriate due to severity of illness   Dispo:  Patient From:    Planned Disposition:    Expected discharge date: 10/03/2020  Medically stable for discharge:    Difficult to place: Yes; Barriers to DC: Continues to require very high levels of oxygen secondary to Covid lung injury.  She is physically deconditioned and requires SNF for rehabilitative therapies but given high level of oxygen requirements not a candidate for SNF at this time.  2/15 patient referred to [facility] -can't accept until FiO2 </= 60% as of 2/21 FiO2 at 60%.  [facility] has been recontacted regarding request for admission   Level of care: Progressive  Code Status: DNR  Family Communication: Patient; daughter [name] [name] 2/21.   DVT prophylaxis: Lovenox Vaccination status: Fully vaccinated against Covid prior to admission.  Initial vaccine November 2021 with follow-up vaccines March and April 2021  Foley catheter: No  HPI: 63 old female with history of renal transplant x2 on chronic immunosuppression agents, diabetes, hypothyroidism who presented with shortness of breath initially.  She was found to be in acute hypoxic respiratory failure due to Covid pneumonia.  Hospital course was prolonged and was complicated by slow improvement from severe hypoxia, AKI and non-STEMI.  Continues to require high flow oxygen.  Patient was a DNR until 2/10 at which point she changed back to full code. Further discussions held w/ pt and dtr and code status changed back to DNR on 2/12   Subjective: Patient now awake and laying in bed.  Still with some increased work of breathing and tachypnea.  No specific complaints verbalized.  Discussed with  patient that now her O2 requirements have increased and she is not eligible to discharge to LTAC.  She verbalized understanding of this plan.  Also discussed with patient my concerns that her COVID pulmonary fibrosis is worsening and is declaring itself i.e. she [name] not ever be able to be weaned on an appropriate dosage of oxygen to be discharged home.  Deferred additional discussions of this to pulmonary medicine but did prepare patient for the likelihood that she [name] not improve.  Objective: Vitals:   09/30/20 0248 09/30/20 0501  BP:  (!) 158/69  Pulse: 97 96  Resp: (!) 28 (!) 23  Temp:  98.5 F (36.9 C)  SpO2: 96% 97%    Intake/Output Summary (Last 24 hours) at 09/30/2020 0730 Last data filed at 09/29/2020 1200 Gross per 24 hour  Intake 120 ml  Output --  Net 120 ml   Filed Weights   09/25/20 0458 09/26/20 0311 09/30/20 0500  Weight: 86.8 kg 86.8 kg 85.1 kg    Exam:  General: Alert and awake.  Mild distress as evidenced by increased tachypnea and work of breathing Respiratory: Lungs are coarse but clear and quite diminished in the bases.  Increased respiratory rate with increased work of breathing.  40 L/min HHFNC 80% FiO2 with O2 sats is 92 to 96% Cardiovascular: Heart sounds are normal, pulses regular with some resting tachycardia but not as significant as yesterday when at times her ventricular responses would go into the 1 20-1 40 range.  No peripheral edema and skin is warm and dry. Abdomen: Soft.  Bowel sounds present.  Marginal oral intake.  LBM 2/20 Neurologic:  CN 2-12 grossly intact. Sensation intact, DTR normal. Strength 3-4/5 x all 4 extremities.  Psychiatric: Awake, alert and oriented x3.  Appropriate affect.   Assessment/Plan: Acute problems: Sepsis /acute on chronic hypoxic respiratory failure due to Covid pneumonia/new hospital associated pneumonia as of 2/14:  Fully vaccinated patient.  Immunocompromised status due to renal transplant, immunosuppressive  medications.   Presented with multifocal pneumonia from Covid.  Completed treatment with IV steroids, baricitinib, remdesivir.  Also was treated with IV antibiotics for superimposed bacterial pneumonia.   Recent CT of the chest demonstrates progressive pulmonary fibrosis secondary to COVID.   2/22: FiO2 have been weaned down to 60% but patient demonstrated increased work of breathing and tachycardia and reports of feeling cold.  FiO2 has subsequently been increased back up to 80% with improvement in vital signs.  Unfortunately I suspect patient's post Covid pulmonary fibrosis is worsening/declaring itself and it appears highly unlikely that her pulmonary function will improve.  I anticipate we [name] be needing to involve palliative care to discuss goals of care with patient and family Continue pulmonary toileting, incentive spirometer/flutter valve.   Continue scheduled Ativan every 12 hours and IV Dilaudid 0.5 mg every 4 hours prn for air hunger and associated anxiety-patient did utilize a dose of IV Dilaudid on 2/20 Completed 7 days of cefepime  Continue Solumedrol 60 mg IV q 6hrs-taper at discretion of PCCM   Imaging not consistent with pneumocystis jirovecii   Yeast vaginitis -Likely secondary to recent antibiotics, steroids and resultant -Began intravaginal antifungals on 2/21  AKI on CKD stage IV/transplant kidney:  Transplant x2.  AKI was most likely hemodynamically mediated.   Creatinine has remained acutely around 1.7.   Nephrology has signed off  Continue Prograf and Myfortic  Prednisone on hold in favor of IV Solu-Medrol-will taper at discretion of PCCM Avoid nephrotoxic medication Previous hyperkalemia resolved  DKA/diabetes mellitus on insulin with hyperglycemia DKA resolved.  No longer on insulin drip.   2/21 persistent hyperglycemia w/ steroids so will increase Levemir to 35 units and continue SSI to resistent CBGs have been between 196 and 234 over the past  24-hours Hemoglobin A1c of 8.2 as per 1/11.    Sinus pause: Happened on  1/20 with 4.5 seconds pause.   Beta-blocker discontinued.  Currently stable.  NSTEMI:  Patient with chest pain on 1/23 with EKG changes.   Treated with Nitroglycerin, morphine and beta-blocker.   Echo is reassuring with preserved ejection fraction.   Continue aspirin/statin completed 48 hours of IV heparin.   Cardiology signed off.  Chronic normocytic anemia:  Most likely associated  with CKD.  No signs of bleeding.   Status post 1 unit of PRBC transfusion on 09/11/2020.   Continue monitoring hemoglobin intermittently  Deconditioning/debility:  PT/OT recommended skilled nursing facility on discharge but current O2 requirements are too high to facilitate discharge to skilled nursing facility. LTAC reviewed chart but FiO2 must be </= 60%  Stage I decubitus coccyx Pressure Injury 08/20/20 Coccyx Mid Stage 1 -  Intact skin with non-blanchable redness of a localized area usually over a bony prominence. (Active)  Date First Assessed/Time First Assessed: 08/20/20 0800   Location: Coccyx  Location Orientation: Mid  Staging: Stage 1 -  Intact skin with non-blanchable redness of a localized area usually over a bony prominence.  Present on Admission: Yes    Assessments 08/20/2020  9:00 AM 09/29/2020  8:03 AM  Dressing Type Foam - Lift dressing to assess site every shift Foam - Lift dressing  to assess site every shift  Dressing Changed Clean;Dry  Dressing Change Frequency Every 3 days Every 3 days  Site / Wound Assessment Clean;Dry;Pink Clean;Dry;Pink  Wound Length (cm) 5 cm --  Wound Width (cm) 0.1 cm --  Wound Depth (cm) 0 cm --  Wound Surface Area (cm^2) 0.5 cm^2 --  Wound Volume (cm^3) 0 cm^3 --  Drainage Amount None None  Treatment Cleansed;Off loading --     No Linked orders to display     Wound / Incision (Open or Dehisced) 08/31/20 Puncture Abdomen Left;Lower From Heparin SQ injections. (Active)  Date First  Assessed/Time First Assessed: 08/31/20 2000   Wound Type: Puncture  Location: Abdomen  Location Orientation: Left;Lower  Wound Description (Comments): From Heparin SQ injections.  Present on Admission: No    Assessments 08/31/2020  8:00 PM 09/25/2020  8:30 AM  Wound Length (cm) 0 cm --  Wound Width (cm) 0 cm --  Wound Depth (cm) 0 cm --  Wound Volume (cm^3) 0 cm^3 --  Wound Surface Area (cm^2) 0 cm^2 --  Treatment -- Cleansed     No Linked orders to display       Other problems: Transaminitis:  Resolved likely secondary to COVID meds as well as hypoperfusion  Right upper quadrant ultrasound is nonacute and showed some sludge/gallstones but no signs of cholecystitis.   Hepatitis panel negative.    Thrombocytopenia:  Most likely associated with acute illness.  Continue to monitor,stable  Complicated UTI:  Completed course of Rocephin.  Hypothyroidism:  Continue Synthyroid.   Repeat TFT in 3 months  Hypertension:  Currently blood pressure stable.  Continue medications  Hyperlipidemia:  Statin held due to elevated LFTs.   LFTs have normalized so can consider resumption of statin although oral intake remains variable  Obesity:  BMI 33.9  Nutrition Status: Estimated body mass index is 34.31 kg/m as calculated from the following:   Height as of this encounter: 5\' 2"  (1.575 m).   Weight as of this encounter: 85.1 kg.  Poor oral intake 2/2 air hunger          Data Reviewed: Basic Metabolic Panel: Recent Labs  Lab 09/24/20 0423 09/27/20 0126  NA 135 139  K 4.8 4.2  CL 105 109  CO2 21* 20*  GLUCOSE 332* 137*  BUN 29* 45*  CREATININE 1.94* 1.92*  CALCIUM 10.0 10.1   Liver Function Tests: No results for input(s): AST, ALT, ALKPHOS, BILITOT, PROT, ALBUMIN in the last 168 hours. No results for input(s): LIPASE, AMYLASE in the last 168 hours. No results for input(s): AMMONIA in the last 168 hours. CBC: Recent Labs  Lab 09/24/20 0423 09/27/20 0126  09/30/20 0120  WBC 5.6 7.3 12.1*  HGB 10.1* 10.6* 11.8*  HCT 31.0* 31.5* 36.0  MCV 83.1 81.0 82.6  PLT 136* 143* 190   Cardiac Enzymes: No results for input(s): CKTOTAL, CKMB, CKMBINDEX, TROPONINI in the last 168 hours. BNP (last 3 results) Recent Labs    09/11/20 0155 09/12/20 0113 09/15/20 0028  BNP 86.8 80.1 80.0    ProBNP (last 3 results) No results for input(s): PROBNP in the last 8760 hours.  CBG: Recent Labs  Lab 09/29/20 0855 09/29/20 1238 09/29/20 1644 09/29/20 2010 09/30/20 0430  GLUCAP 163* 321* 273* 218* 184*    Recent Results (from the past 240 hour(s))  Culture, blood (routine x 2)     Status: None   Collection Time: 09/22/20  8:26 AM   Specimen: BLOOD LEFT HAND  Result  Value Ref Range Status   Specimen Description BLOOD LEFT HAND  Final   Special Requests   Final    BOTTLES DRAWN AEROBIC ONLY Blood Culture adequate volume   Culture   Final    NO GROWTH 5 DAYS Performed at [facility], [address], [place], [place] [address]    Report Status 09/27/2020 FINAL  Final  Culture, blood (routine x 2)     Status: None   Collection Time: 09/22/20  8:27 AM   Specimen: BLOOD LEFT ARM  Result Value Ref Range Status   Specimen Description BLOOD LEFT ARM  Final   Special Requests   Final    BOTTLES DRAWN AEROBIC ONLY Blood Culture adequate volume   Culture   Final    NO GROWTH 5 DAYS Performed at [facility], [address] [place], [place] [address]    Report Status 09/27/2020 FINAL  Final  Aspergillus Ag, BAL/Serum     Status: None   Collection Time: 09/23/20  1:01 AM   Specimen: Vein; Blood  Result Value Ref Range Status   Aspergillus Ag, BAL/Serum 0.03 0.00 - 0.49 Index Final    Comment: (NOTE) Performed At: [facility] [place], [place] [id number] Rush Farmer MD [id number]   MRSA PCR Screening     Status: None   Collection Time: 09/23/20  9:30 AM   Specimen: Nasal Mucosa; Nasopharyngeal  Result  Value Ref Range Status   MRSA by PCR NEGATIVE NEGATIVE Final    Comment:        The GeneXpert MRSA Assay (FDA approved for NASAL specimens only), is one component of a comprehensive MRSA colonization surveillance program. It is not intended to diagnose MRSA infection nor to guide or monitor treatment for MRSA infections. Performed at [facility], [place] [address], [place], [place] [address]      Studies: DG CHEST PORT 1 VIEW  Result Date: 09/29/2020 CLINICAL DATA:  Acute hypoxemic respiratory failure. EXAM: PORTABLE CHEST 1 VIEW COMPARISON:  September 22, 2020. FINDINGS: Stable cardiomegaly. No pneumothorax or significant pleural effusion is noted. Stable bilateral lung opacities are noted concerning for multifocal pneumonia. Bony thorax is unremarkable. IMPRESSION: Stable bilateral lung opacities are noted concerning for multifocal pneumonia. Electronically Signed   By: Marijo Conception M.D.   On: 09/29/2020 11:43    Scheduled Meds: . amLODipine  10 mg Oral Daily  . vitamin C  500 mg Oral Daily  . aspirin EC  81 mg Oral Daily  . clotrimazole  1 Applicatorful Vaginal QHS  . darbepoetin (ARANESP) injection - NON-DIALYSIS  150 mcg Subcutaneous Q Tue-1800  . enoxaparin (LOVENOX) injection  30 mg Subcutaneous Q24H  . feeding supplement (NEPRO CARB STEADY)  237 mL Oral BID BM  . fluticasone  1 spray Each Nare Daily  . folic acid  1 mg Oral Daily  . hydrALAZINE  50 mg Oral Q8H  . insulin aspart  0-20 Units Subcutaneous TID WC  . insulin detemir  35 Units Subcutaneous BID  . isosorbide mononitrate  60 mg Oral Daily  . levothyroxine  50 mcg Oral Q0600  . LORazepam  1 mg Oral BID  . mouth rinse  15 mL Mouth Rinse BID  . methylPREDNISolone (SOLU-MEDROL) injection  60 mg Intravenous Q6H  . mycophenolate  360 mg Oral BID  . pantoprazole  40 mg Oral BID  . tacrolimus  3 mg Oral QPM  . tacrolimus  4 mg Oral Daily  .  zinc sulfate  220 mg Oral Daily   Continuous Infusions: . sodium  chloride    . sodium chloride Stopped (08/31/20 1545)    Principal Problem:   ARF (acute renal failure) (HCC) Active Problems:   AKI (acute kidney injury) ([place])   Fever, unspecified   Renal transplant recipient   Immunocompromised ([place])   DM (diabetes mellitus), type 2 with renal complications ([place])   Essential hypertension with goal blood pressure less than 140/90   Acute hypoxemic respiratory failure due to COVID-19 ([place])   Respiratory failure (HCC)   Acute hyperkalemia   Diabetic ketoacidosis without coma associated with type 1 diabetes mellitus ([place])   Pressure injury of skin   Acute and chronic respiratory failure with hypoxia ([place])   HCAP (healthcare-associated pneumonia)   Pulmonary fibrosis, postinflammatory (HCC)   Transaminitis   Controlled type 2 diabetes mellitus with hyperglycemia, with long-term current use of insulin ([place])   Consultants:  None  Procedures:  Echocardiogram  Antibiotics: Anti-infectives (From admission, onward)   Start     Dose/Rate Route Frequency Ordered Stop   09/24/20 1200  vancomycin (VANCOREADY) IVPB 1000 mg/200 mL  Status:  Discontinued        1,000 mg 200 mL/hr over 60 Minutes Intravenous Every 48 hours 09/22/20 1036 09/24/20 1013   09/22/20 1130  vancomycin (VANCOREADY) IVPB 1750 mg/350 mL        1,750 mg 175 mL/hr over 120 Minutes Intravenous  Once 09/22/20 1036 09/22/20 1735   09/22/20 1130  ceFEPIme (MAXIPIME) 2 g in sodium chloride 0.9 % 100 mL IVPB        2 g 200 mL/hr over 30 Minutes Intravenous Every 24 hours 09/22/20 1036 09/28/20 0052   09/07/20 1030  vancomycin (VANCOCIN) IVPB 1000 mg/200 mL premix  Status:  Discontinued        1,000 mg 200 mL/hr over 60 Minutes Intravenous Every 48 hours 09/05/20 0933 09/07/20 0951   09/05/20 1030  vancomycin (VANCOREADY) IVPB 2000 mg/400 mL        2,000 mg 200 mL/hr over 120 Minutes Intravenous  Once 09/05/20 0933 09/05/20 2018   09/05/20 1030  ceFEPIme (MAXIPIME) 2 g in sodium  chloride 0.9 % 100 mL IVPB  Status:  Discontinued        2 g 200 mL/hr over 30 Minutes Intravenous Every 24 hours 09/05/20 0933 09/07/20 0951   08/20/20 1445  cefTRIAXone (ROCEPHIN) 1 g in sodium chloride 0.9 % 100 mL IVPB        1 g 200 mL/hr over 30 Minutes Intravenous Every 24 hours 08/20/20 1348 08/26/20 2147   08/20/20 1000  remdesivir 100 mg in sodium chloride 0.9 % 100 mL IVPB       "Followed by" Linked Group Details   100 mg 200 mL/hr over 30 Minutes Intravenous Daily 08/19/20 0021 08/23/20 1113   08/19/20 0130  remdesivir 200 mg in sodium chloride 0.9% 250 mL IVPB       "Followed by" Linked Group Details   200 mg 580 mL/hr over 30 Minutes Intravenous Once 08/19/20 0021 08/19/20 0314       Time spent: 30 minutes    Erin Hearing ANP  [facility] 7 am - 330 pm/M-F for direct patient care and secure chat Please refer to Amion for contact info 42  days

## 2020-09-30 NOTE — TOC Progression Note (Signed)
Transition of Care [facility]) - Progression Note    Patient Details  Name: Ambria Mayfield MRN: [id number] Date of Birth: 14-Mar-1956  Transition of Care [facility]) CM/SW Contact  Curlene Labrum, RN Phone Number: 09/30/2020, 10:25 AM  Clinical Narrative:    Case management spoke with Tammy, RNCM with [employer] and the patient's oxygen need has increased to 80% through [place] with increased respiratory effort overnight.  Insurance authorization was placed on hold at this point until patient more stable at a later date if possible.  I called Arley Phenix, CM with [facility] and made her aware that insurance authorization was on hold and patient unable to transfer to [place].  CM and MSW will continue to follow for TOC needs.   Expected Discharge Plan: Long Term Acute Care (LTAC) Barriers to Discharge: Continued Medical Work up  Expected Discharge Plan and Services Expected Discharge Plan: [place] (LTAC) In-house Referral: Clinical Social Work Discharge Planning Services: CM Consult Post Acute Care Choice: [place] arrangements for the past 2 months: Single Family Home                                       Social Determinants of Health (SDOH) Interventions    Readmission Risk Interventions Readmission Risk Prevention Plan 09/19/2020  Transportation Screening Complete  Medication Review Press photographer) Complete  PCP or Specialist appointment within 3-5 days of discharge Complete  HRI or Home Care Consult Complete  SW Recovery Care/Counseling Consult Complete  Palliative Care Screening Complete  Skilled Nursing Facility Complete  Some recent data might be hidden

## 2020-09-30 NOTE — Plan of Care (Signed)
  Problem: Education: Goal: Knowledge of risk factors and measures for prevention of condition will improve Outcome: Progressing   Problem: Coping: Goal: Psychosocial and spiritual needs will be supported Outcome: Progressing   Problem: Respiratory: Goal: Will maintain a patent airway Outcome: Progressing Goal: Complications related to the disease process, condition or treatment will be avoided or minimized Outcome: Progressing   Problem: Health Behavior/Discharge Planning: Goal: Ability to manage health-related needs will improve Outcome: Progressing   Problem: Clinical Measurements: Goal: Ability to maintain clinical measurements within normal limits will improve Outcome: Progressing Goal: Will remain free from infection Outcome: Progressing Goal: Diagnostic test results will improve Outcome: Progressing Goal: Respiratory complications will improve Outcome: Progressing Goal: Cardiovascular complication will be avoided Outcome: Progressing   Problem: Activity: Goal: Risk for activity intolerance will decrease Outcome: Progressing   Problem: Nutrition: Goal: Adequate nutrition will be maintained Outcome: Progressing   Problem: Coping: Goal: Level of anxiety will decrease Outcome: Progressing   Problem: Safety: Goal: Ability to remain free from injury will improve Outcome: Progressing   Problem: Skin Integrity: Goal: Risk for impaired skin integrity will decrease Outcome: Progressing   Problem: Education: Goal: Knowledge of disease and its progression will improve Outcome: Progressing Goal: Individualized Educational Video(s) Outcome: Progressing   Problem: Fluid Volume: Goal: Compliance with measures to maintain balanced fluid volume will improve Outcome: Progressing   Problem: Health Behavior/Discharge Planning: Goal: Ability to manage health-related needs will improve Outcome: Progressing   Problem: Nutritional: Goal: Ability to make healthy dietary  choices will improve Outcome: Progressing   Problem: Clinical Measurements: Goal: Complications related to the disease process, condition or treatment will be avoided or minimized Outcome: Progressing

## 2020-10-01 DIAGNOSIS — D849 Immunodeficiency, unspecified: Secondary | ICD-10-CM | POA: Diagnosis not present

## 2020-10-01 DIAGNOSIS — U071 COVID-19: Secondary | ICD-10-CM | POA: Diagnosis not present

## 2020-10-01 DIAGNOSIS — E1121 Type 2 diabetes mellitus with diabetic nephropathy: Secondary | ICD-10-CM | POA: Diagnosis not present

## 2020-10-01 DIAGNOSIS — J9621 Acute and chronic respiratory failure with hypoxia: Secondary | ICD-10-CM | POA: Diagnosis not present

## 2020-10-01 LAB — GLUCOSE, CAPILLARY
Glucose-Capillary: 45 mg/dL — ABNORMAL LOW (ref 70–99)
Glucose-Capillary: 136 mg/dL — ABNORMAL HIGH (ref 70–99)
Glucose-Capillary: 134 mg/dL — ABNORMAL HIGH (ref 70–99)
Glucose-Capillary: 69 mg/dL — ABNORMAL LOW (ref 70–99)

## 2020-10-01 MED ORDER — PREDNISONE 20 MG PO TABS: 50.0000 mg | ORAL_TABLET | Freq: Two times a day (BID) | ORAL | Status: DC

## 2020-10-01 MED ORDER — INSULIN DETEMIR 100 UNIT/ML ~~LOC~~ SOLN
30.0000 [IU] | Freq: Two times a day (BID) | SUBCUTANEOUS | Status: DC
  Administered 2020-10-02 – 2020-10-03 (×4): 30 [IU] via SUBCUTANEOUS
  Filled 2020-10-01 (×7): qty 0.3

## 2020-10-01 MED ORDER — INSULIN ASPART 100 UNIT/ML ~~LOC~~ SOLN
2.0000 [IU] | Freq: Three times a day (TID) | SUBCUTANEOUS | Status: DC
  Administered 2020-10-01 – 2020-10-13 (×36): 2 [IU] via SUBCUTANEOUS

## 2020-10-01 MED ORDER — PREDNISONE 20 MG PO TABS
50.0000 mg | ORAL_TABLET | Freq: Two times a day (BID) | ORAL | Status: DC
  Administered 2020-10-01 – 2020-10-13 (×25): 50 mg via ORAL
  Filled 2020-10-01 (×24): qty 2

## 2020-10-01 NOTE — Progress Notes (Signed)
Inpatient Diabetes Program Recommendations  AACE/ADA: New Consensus Statement on Inpatient Glycemic Control  Target Ranges:  Prepandial:   less than 140 mg/dL      Peak postprandial:   less than 180 mg/dL (1-2 hours)      Critically ill patients:  140 - 180 mg/dL   Results for [name] [name], [name] [name] (MRN [id number]) as of 10/01/2020 11:22  Ref. Range 09/30/2020 07:40 09/30/2020 12:43 09/30/2020 16:43 09/30/2020 19:59 09/30/2020 22:36 10/01/2020 08:13  Glucose-Capillary Latest Ref Range: 70 - 99 mg/dL 147 (H)  Novolog 3 units  Levemir 35 units 227 (H)  Novolog 7 units 276 (H)  Novolog 11 units 239 (H)     Levemir 35 units 132 (H) 45 (L)   Review of Glycemic Control  Current orders for Inpatient glycemic control: Levemir 35 units BID, Novolog 0-20 units TID with meals, Novolog 2 units TID with meals; Solumedrol 60 mg Q6H  Inpatient Diabetes Program Recommendations:    Insulin: If steroids are continued, please consider decreasing Levemir to 30 units BID and increasing meal coverage to Novolog 6 units TID with meals if patient eats at least 50% of meals.  NOTE: Per chart, patient missed 3 doses of Solumedrol over the past 24 hours (per [facility] not given due to loss if IV access).  Per chart, patient did receive Solumedrol dose this am at 10:24 am.  Anticipate hypoglycemia due to getting high doses of basal insulin and steroids not being given. Would recommend decreasing basal insulin and increasing meal coverage insulin. Anticipate insulin will need to be adjusted as steroids are tapered.  Thanks, Barnie Alderman, RN, MSN, CDE Diabetes Coordinator Inpatient Diabetes Program [phone] (Team Pager from 8am to 5pm)

## 2020-10-01 NOTE — Progress Notes (Addendum)
[facility] PROGRESS NOTE  [name] [name] ZJI:[id number] DOB: [birth year] DOA: 08/18/2020 PCP: Glendale Chard, MD  Status: Remains inpatient appropriate because:Unsafe d/c plan, IV treatments appropriate due to intensity of illness or inability to take PO and Inpatient level of care appropriate due to severity of illness   Dispo:  Patient From:  HOME  Planned Disposition:  SNF  Expected discharge date: 10/03/2020  Medically stable for discharge:  NO  Difficult to place: Yes; Barriers to DC: Continues to require very high levels of oxygen secondary to Covid lung injury.  She is physically deconditioned and requires SNF for rehabilitative therapies but given high level of oxygen requirements not a candidate for SNF at this time.   Level of care: Progressive  Code Status: DNR  Family Communication: Patient; daughter Mendel Ryder 2/21.   DVT prophylaxis: Lovenox Vaccination status: Fully vaccinated against Covid prior to admission.  Initial vaccine November 2021 with follow-up vaccines March and April 2021  Foley catheter: No  HPI: 42 old female with history of renal transplant x2 on chronic immunosuppression agents, diabetes, hypothyroidism who presented with shortness of breath initially.  She was found to be in acute hypoxic respiratory failure due to Covid pneumonia.  Hospital course was prolonged and was complicated by slow improvement from severe hypoxia, AKI and non-STEMI.  Continues to require high flow oxygen.  Patient was a DNR until 2/10 at which point she changed back to full code. Further discussions held w/ pt and dtr and code status changed back to DNR on 2/12   Subjective: Awake.  Sitting up in bed.  Reported that she had eaten all of her breakfast.  Also reports she is feeling better than yesterday.  Objective: Vitals:   10/01/20 0347 10/01/20 0355  BP: (!) 153/66 138/63  Pulse: 94 94  Resp: (!) 34 (!) 29  Temp:  98.1 F (36.7 C)  SpO2: 92% 91%   No  intake or output data in the 24 hours ending 10/01/20 0729 Filed Weights   09/26/20 0311 09/30/20 0500 10/01/20 0500  Weight: 86.8 kg 85.1 kg 86.3 kg    Exam:  General: Alert, no increased work of breathing except with talking, no distress Respiratory: Anterior lung sounds are clear to auscultation and decreased in the bases.  40 L/min HHFNC 80% FiO2 with O2 sats is 90 to 97%-increased work of breathing noted with phonation; O2 sats do decrease with activity sometimes as low as 70% and takes several minutes to recover. Cardiovascular: Heart sounds are S1-S2, skin warm and dry, pulse is regular and nontachycardic Abdomen:  Marginal oral intake.  Abdomen soft and nontender.  LBM 2/22 Neurologic: CN 2-12 grossly intact. Sensation intact, DTR normal. Strength 3-4/5 x all 4 extremities.  Psychiatric: Awake and alert.  Oriented x3.  Pleasant affect.   Assessment/Plan: Acute problems: Sepsis /acute on chronic hypoxic respiratory failure due to Covid pneumonia/new hospital associated pneumonia as of 2/14:  Fully vaccinated patient.  Immunocompromised status due to renal transplant, immunosuppressive medications.   Presented with multifocal pneumonia from Covid.  Completed treatment with IV steroids, baricitinib, remdesivir.  Also was treated with IV antibiotics for superimposed bacterial pneumonia.   Recent CT of the chest demonstrates progressive pulmonary fibrosis secondary to COVID.   FiO2 had been weaned down to 60% but patient demonstrated increased work of breathing and tachycardia and reports of feeling cold.  FiO2 has subsequently been increased back up to 80% with improvement in vital signs.  Unfortunately I suspect patient's post Covid pulmonary  fibrosis is worsening/declaring itself and it appears highly unlikely that her pulmonary function will improve.  I anticipate we may need to involve palliative care to discuss goals of care with patient and family Continue pulmonary toileting,  incentive spirometer/flutter valve.   Continue scheduled Ativan every 12 hours and IV Dilaudid 0.5 mg every 4 hours prn for air hunger and associated anxiety Completed 7 days of cefepime  Lost IV access and missed a few doses of Solumedrol- will change to Prednisone to 50 mg BID  DKA/diabetes mellitus on insulin with hyperglycemia DKA resolved.  No longer on insulin drip.   Decrease Levemir to 30 units given change to Prednisone- cont resistant SSI  23: CBGs > 200s so will add Novolog MC 2 units  Hemoglobin A1c of 8.2 as per 1/11.   2/23 CBG at 8 AM had decreased to 45 2/2 missed doses of IV Solumedrol-have changed to Prednisone  Yeast vaginitis -Likely secondary to recent antibiotics, steroids and resultant -Began intravaginal antifungals on 2/21 x 3 days  AKI on CKD stage IV/transplant kidney:  Transplant x2.  AKI was most likely hemodynamically mediated.   Creatinine has remained acutely around 1.7.   Nephrology has signed off  Continue Prograf and Myfortic  Prednisone on hold in favor of IV Solu-Medrol-will taper at discretion of PCCM Avoid nephrotoxic medications  Chronic normocytic anemia:  Most likely associated  with CKD.  No signs of bleeding.   Status post 1 unit of PRBC transfusion on 09/11/2020.   Hemoglobin 11.8 on 2/21  Deconditioning/debility:  PT/OT recommended skilled nursing facility on discharge but current O2 requirements are too high to facilitate discharge to skilled nursing facility. LTAC reviewed chart but FiO2 must be </= 60%  Stage I decubitus coccyx Pressure Injury 08/20/20 Coccyx Mid Stage 1 -  Intact skin with non-blanchable redness of a localized area usually over a bony prominence. (Active)  Date First Assessed/Time First Assessed: 08/20/20 0800   Location: Coccyx  Location Orientation: Mid  Staging: Stage 1 -  Intact skin with non-blanchable redness of a localized area usually over a bony prominence.  Present on Admission: Yes    Assessments  08/20/2020  9:00 AM 09/29/2020  8:03 AM  Dressing Type Foam - Lift dressing to assess site every shift Foam - Lift dressing to assess site every shift  Dressing Changed Clean;Dry  Dressing Change Frequency Every 3 days Every 3 days  Site / Wound Assessment Clean;Dry;Pink Clean;Dry;Pink  Wound Length (cm) 5 cm -  Wound Width (cm) 0.1 cm -  Wound Depth (cm) 0 cm -  Wound Surface Area (cm^2) 0.5 cm^2 -  Wound Volume (cm^3) 0 cm^3 -  Drainage Amount None None  Treatment Cleansed;Off loading -     No Linked orders to display     Wound / Incision (Open or Dehisced) 08/31/20 Puncture Abdomen Left;Lower From Heparin SQ injections. (Active)  Date First Assessed/Time First Assessed: 08/31/20 2000   Wound Type: Puncture  Location: Abdomen  Location Orientation: Left;Lower  Wound Description (Comments): From Heparin SQ injections.  Present on Admission: No    Assessments 08/31/2020  8:00 PM 09/25/2020  8:30 AM  Wound Length (cm) 0 cm -  Wound Width (cm) 0 cm -  Wound Depth (cm) 0 cm -  Wound Volume (cm^3) 0 cm^3 -  Wound Surface Area (cm^2) 0 cm^2 -  Treatment - Cleansed     No Linked orders to display       Other problems: Transaminitis:  Resolved likely  secondary to COVID meds as well as hypoperfusion  Right upper quadrant ultrasound is nonacute and showed some sludge/gallstones but no signs of cholecystitis.   Hepatitis panel negative.    NSTEMI:  Patient with chest pain on 1/23 with EKG changes.   Treated with Nitroglycerin, morphine and beta-blocker.   Echo is reassuring with preserved ejection fraction.   Continue aspirin/statin completed 48 hours of IV heparin.   Cardiology signed off.   Sinus pause: Happened on  1/20 with 4.5 seconds pause.   Beta-blocker discontinued.  Currently stable.  Thrombocytopenia:  Most likely associated with acute illness.  Continue to monitor,stable  Complicated UTI:  Completed course of Rocephin.  Hypothyroidism:  Continue Synthyroid.    Repeat TFT in 3 months  Hypertension:  Currently blood pressure stable.  Continue medications  Hyperlipidemia:  Statin held due to elevated LFTs.   LFTs have normalized so can consider resumption of statin although oral intake remains variable  Obesity:  BMI 33.9  Nutrition Status: Estimated body mass index is 34.8 kg/m as calculated from the following:   Height as of this encounter: 5\' 2"  (1.575 m).   Weight as of this encounter: 86.3 kg.  Poor oral intake 2/2 air hunger          Data Reviewed: Basic Metabolic Panel: Recent Labs  Lab 09/27/20 0126  NA 139  K 4.2  CL 109  CO2 20*  GLUCOSE 137*  BUN 45*  CREATININE 1.92*  CALCIUM 10.1   Liver Function Tests: No results for input(s): AST, ALT, ALKPHOS, BILITOT, PROT, ALBUMIN in the last 168 hours. No results for input(s): LIPASE, AMYLASE in the last 168 hours. No results for input(s): AMMONIA in the last 168 hours. CBC: Recent Labs  Lab 09/27/20 0126 09/30/20 0120  WBC 7.3 12.1*  HGB 10.6* 11.8*  HCT 31.5* 36.0  MCV 81.0 82.6  PLT 143* 190   Cardiac Enzymes: No results for input(s): CKTOTAL, CKMB, CKMBINDEX, TROPONINI in the last 168 hours. BNP (last 3 results) Recent Labs    09/11/20 0155 09/12/20 0113 09/15/20 0028  BNP 86.8 80.1 80.0    ProBNP (last 3 results) No results for input(s): PROBNP in the last 8760 hours.  CBG: Recent Labs  Lab 09/30/20 0740 09/30/20 1243 09/30/20 1643 09/30/20 1959 09/30/20 2236  GLUCAP 147* 227* 276* 239* 132*    Recent Results (from the past 240 hour(s))  Culture, blood (routine x 2)     Status: None   Collection Time: 09/22/20  8:26 AM   Specimen: BLOOD LEFT HAND  Result Value Ref Range Status   Specimen Description BLOOD LEFT HAND  Final   Special Requests   Final    BOTTLES DRAWN AEROBIC ONLY Blood Culture adequate volume   Culture   Final    NO GROWTH 5 DAYS Performed at [facility], [place] [address] [place], [place] [address]     Report Status 09/27/2020 FINAL  Final  Culture, blood (routine x 2)     Status: None   Collection Time: 09/22/20  8:27 AM   Specimen: BLOOD LEFT ARM  Result Value Ref Range Status   Specimen Description BLOOD LEFT ARM  Final   Special Requests   Final    BOTTLES DRAWN AEROBIC ONLY Blood Culture adequate volume   Culture   Final    NO GROWTH 5 DAYS Performed at [facility], [address], [place], [place] [address]    Report Status 09/27/2020 FINAL  Final  Aspergillus Ag, BAL/Serum     Status: None   Collection Time: 09/23/20  1:01 AM   Specimen: Vein; Blood  Result Value Ref Range Status   Aspergillus Ag, BAL/Serum 0.03 0.00 - 0.49 Index Final    Comment: (NOTE) Performed At: [facility] [place], [place] [id number] Rush Farmer MD AY:[id number]   MRSA PCR Screening     Status: None   Collection Time: 09/23/20  9:30 AM   Specimen: Nasal Mucosa; Nasopharyngeal  Result Value Ref Range Status   MRSA by PCR NEGATIVE NEGATIVE Final    Comment:        The GeneXpert MRSA Assay (FDA approved for NASAL specimens only), is one component of a comprehensive MRSA colonization surveillance program. It is not intended to diagnose MRSA infection nor to guide or monitor treatment for MRSA infections. Performed at [facility], [place] [address] [place], [place] [address]      Studies: DG CHEST PORT 1 VIEW  Result Date: 09/29/2020 CLINICAL DATA:  Acute hypoxemic respiratory failure. EXAM: PORTABLE CHEST 1 VIEW COMPARISON:  September 22, 2020. FINDINGS: Stable cardiomegaly. No pneumothorax or significant pleural effusion is noted. Stable bilateral lung opacities are noted concerning for multifocal pneumonia. Bony thorax is unremarkable. IMPRESSION: Stable bilateral lung opacities are noted concerning for multifocal pneumonia. Electronically Signed   By: Marijo Conception M.D.   On: 09/29/2020 11:43    Scheduled Meds: . amLODipine  10 mg Oral Daily  .  vitamin C  500 mg Oral Daily  . aspirin EC  81 mg Oral Daily  . clotrimazole  1 Applicatorful Vaginal QHS  . enoxaparin (LOVENOX) injection  30 mg Subcutaneous Q24H  . feeding supplement (NEPRO CARB STEADY)  237 mL Oral BID BM  . fluticasone  1 spray Each Nare Daily  . folic acid  1 mg Oral Daily  . hydrALAZINE  50 mg Oral Q8H  . insulin aspart  0-20 Units Subcutaneous TID WC  . insulin aspart  2 Units Subcutaneous TID WC  . insulin detemir  35 Units Subcutaneous BID  . isosorbide mononitrate  60 mg Oral Daily  . levothyroxine  50 mcg Oral Q0600  . LORazepam  1 mg Oral BID  . mouth rinse  15 mL Mouth Rinse BID  . methylPREDNISolone (SOLU-MEDROL) injection  60 mg Intravenous Q6H  . mycophenolate  360 mg Oral BID  . pantoprazole  40 mg Oral BID  . tacrolimus  3 mg Oral QPM  . tacrolimus  4 mg Oral Daily  . zinc sulfate  220 mg Oral Daily   Continuous Infusions: . sodium chloride    . sodium chloride Stopped (08/31/20 1545)    Principal Problem:   ARF (acute renal failure) (HCC) Active Problems:   AKI (acute kidney injury) ([place])   Fever, unspecified   Renal transplant recipient   Immunocompromised ([place])   DM (diabetes mellitus), type 2 with renal complications ([place])   Essential hypertension with goal blood pressure less than 140/90   Acute hypoxemic respiratory failure due to COVID-19 ([place])   Respiratory failure (HCC)   Acute hyperkalemia   Diabetic ketoacidosis without coma associated with type 1 diabetes mellitus ([place])   Pressure injury of skin   Acute and chronic respiratory failure with hypoxia (HCC)   HCAP (healthcare-associated pneumonia)   Pulmonary fibrosis, postinflammatory (HCC)   Transaminitis   Controlled type 2 diabetes mellitus with hyperglycemia, with long-term current use of insulin ([place])   Consultants:  None  Procedures:  Echocardiogram  Antibiotics: Anti-infectives (From admission, onward)   Start     Dose/Rate Route Frequency Ordered Stop    09/24/20 1200  vancomycin (VANCOREADY) IVPB 1000 mg/200 mL  Status:  Discontinued        1,000 mg 200 mL/hr over 60 Minutes Intravenous Every 48 hours 09/22/20 1036 09/24/20 1013   09/22/20 1130  vancomycin (VANCOREADY) IVPB 1750 mg/350 mL        1,750 mg 175 mL/hr over 120 Minutes Intravenous  Once 09/22/20 1036 09/22/20 1735   09/22/20 1130  ceFEPIme (MAXIPIME) 2 g in sodium chloride 0.9 % 100 mL IVPB        2 g 200 mL/hr over 30 Minutes Intravenous Every 24 hours 09/22/20 1036 09/28/20 0052   09/07/20 1030  vancomycin (VANCOCIN) IVPB 1000 mg/200 mL premix  Status:  Discontinued        1,000 mg 200 mL/hr over 60 Minutes Intravenous Every 48 hours 09/05/20 0933 09/07/20 0951   09/05/20 1030  vancomycin (VANCOREADY) IVPB 2000 mg/400 mL        2,000 mg 200 mL/hr over 120 Minutes Intravenous  Once 09/05/20 0933 09/05/20 2018   09/05/20 1030  ceFEPIme (MAXIPIME) 2 g in sodium chloride 0.9 % 100 mL IVPB  Status:  Discontinued        2 g 200 mL/hr over 30 Minutes Intravenous Every 24 hours 09/05/20 0933 09/07/20 0951   08/20/20 1445  cefTRIAXone (ROCEPHIN) 1 g in sodium chloride 0.9 % 100 mL IVPB        1 g 200 mL/hr over 30 Minutes Intravenous Every 24 hours 08/20/20 1348 08/26/20 2147   08/20/20 1000  remdesivir 100 mg in sodium chloride 0.9 % 100 mL IVPB       "Followed by" Linked Group Details   100 mg 200 mL/hr over 30 Minutes Intravenous Daily 08/19/20 0021 08/23/20 1113   08/19/20 0130  remdesivir 200 mg in sodium chloride 0.9% 250 mL IVPB       "Followed by" Linked Group Details   200 mg 580 mL/hr over 30 Minutes Intravenous Once 08/19/20 0021 08/19/20 0314       Time spent: 20 minutes    Erin Hearing ANP  [facility] 7 am - 330 pm/M-F for direct patient care and secure chat Please refer to Amion for contact info 43  days

## 2020-10-01 NOTE — TOC Progression Note (Signed)
Transition of Care [facility]) - Progression Note    Patient Details  Name: [name] [name] MRN: [id number] Date of Birth: [birth year]  Transition of Care [facility]) CM/SW [place], RN Phone Number: 10/01/2020, 10:52 AM  Clinical Narrative:    Case management and MSW met with the patient at the bedside today for transitions of care discussion.  The patient is currently receiving 80% HFNC but with less respiratory distress as compared to yesterday.  LTAC plans are currently on hold until patient's oxygenation, respiratory effort and oxygentation requirements improve.  CM and MSW will continue to follow the patient for transition of care needs.   Expected Discharge Plan: Long Term Acute Care (LTAC) Barriers to Discharge: Continued Medical Work up  Expected Discharge Plan and Services Expected Discharge Plan: [place] (LTAC) In-house Referral: Clinical Social Work Discharge Planning Services: CM Consult Post Acute Care Choice: [place] arrangements for the past 2 months: Single Family Home                                       Social Determinants of Health (SDOH) Interventions    Readmission Risk Interventions Readmission Risk Prevention Plan 09/19/2020  Transportation Screening Complete  Medication Review Press photographer) Complete  PCP or Specialist appointment within 3-5 days of discharge Complete  HRI or Home Care Consult Complete  SW Recovery Care/Counseling Consult Complete  Palliative Care Screening Complete  Skilled Nursing Facility Complete  Some recent data might be hidden

## 2020-10-01 NOTE — Plan of Care (Signed)
Patient remains on 40% Heated High flow O2. Denies pain or SOB at this time. Safety precautions maintained.

## 2020-10-02 ENCOUNTER — Ambulatory Visit: Payer: HMO | Admitting: Cardiology

## 2020-10-02 DIAGNOSIS — E1121 Type 2 diabetes mellitus with diabetic nephropathy: Secondary | ICD-10-CM | POA: Diagnosis not present

## 2020-10-02 DIAGNOSIS — J9621 Acute and chronic respiratory failure with hypoxia: Secondary | ICD-10-CM | POA: Diagnosis not present

## 2020-10-02 DIAGNOSIS — U071 COVID-19: Secondary | ICD-10-CM | POA: Diagnosis not present

## 2020-10-02 DIAGNOSIS — D849 Immunodeficiency, unspecified: Secondary | ICD-10-CM | POA: Diagnosis not present

## 2020-10-02 LAB — GLUCOSE, CAPILLARY
Glucose-Capillary: 81 mg/dL (ref 70–99)
Glucose-Capillary: 169 mg/dL — ABNORMAL HIGH (ref 70–99)
Glucose-Capillary: 146 mg/dL — ABNORMAL HIGH (ref 70–99)
Glucose-Capillary: 214 mg/dL — ABNORMAL HIGH (ref 70–99)
Glucose-Capillary: 270 mg/dL — ABNORMAL HIGH (ref 70–99)

## 2020-10-02 NOTE — TOC Progression Note (Signed)
Transition of Care [facility]) - Progression Note    Patient Details  Name: [name] [name] MRN: [id number] Date of Birth: [birth year]  Transition of Care [facility]) CM/SW [place], RN Phone Number: 10/02/2020, 1:03 PM  Clinical Narrative:    Case management met at the bedside with the patient regarding transitions of care.  The patient was awake and alert and requiring 90% HFNC at this time.  The patient is being followed for possible LTAC placement at [facility] once the patient's oxygen requirements are less than 60%.  CM will continue to follow the patient for transition of care needs.   Expected Discharge Plan: Long Term Acute Care (LTAC) Barriers to Discharge: Continued Medical Work up  Expected Discharge Plan and Services Expected Discharge Plan: [place] (LTAC) In-house Referral: Clinical Social Work Discharge Planning Services: CM Consult Post Acute Care Choice: [place] arrangements for the past 2 months: Single Family Home                                       Social Determinants of Health (SDOH) Interventions    Readmission Risk Interventions Readmission Risk Prevention Plan 09/19/2020  Transportation Screening Complete  Medication Review Press photographer) Complete  PCP or Specialist appointment within 3-5 days of discharge Complete  HRI or Home Care Consult Complete  SW Recovery Care/Counseling Consult Complete  Palliative Care Screening Complete  Skilled Nursing Facility Complete  Some recent data might be hidden

## 2020-10-02 NOTE — Plan of Care (Signed)

## 2020-10-02 NOTE — Progress Notes (Signed)
Resting quietly. On 30L HFNC 90%. Sats 96%. Denies pain or shortness of breath. BG re checked/see results. Continuing to monitor. Call light in reach.

## 2020-10-02 NOTE — Progress Notes (Signed)
Physical Therapy Treatment Patient Details Name: [name] [name] MRN: [id number] DOB: 1955-11-30 Today's Date: 10/02/2020    History of Present Illness Pt is a 65 y.o. female who tested (+) COVID-19 on 08/13/20, now admitted 08/18/20 with worsening SOB, cough, chest pain and body aches. Acute hypoxic respiratory failure due to COVID-19 PNA, DKA, AKI on CKD, UTI. CP on 1/22 with NSTEMI . PMHx: HTN, DM2, chronic steroid use, s/p renal transplant x2.    PT Comments    Pt very pleasant and wanting to mobilize on 30L/80% FiO2 HHFNC without face mask this session. Pt with resting SpO2 93% with desaturation to 85% with transition to EOB and unable to rebound after 7 min with pt then assisted to stand and side step toward [facility] with continued decline to 82% and pt bumped to 90% fiO2 with pt assisted back to bed and bed elevated. After recovery to 93% in bed supported returned FiO2 to 80%. Pt with continued fatigue and desaturation with all mobility and inability to recover for OOB this date. Pt also requiring increased assist for all transfers.     Follow Up Recommendations  Supervision/Assistance - 24 hour;LTACH     Equipment Recommendations  3in1 (PT);Hospital bed;Other (comment);Wheelchair (measurements PT)    Recommendations for Other Services       Precautions / Restrictions Precautions Precautions: Fall Precaution Comments: watch SpO2 quick to desaturate with HHFNC Restrictions Weight Bearing Restrictions: No    Mobility  Bed Mobility Overal bed mobility: Needs Assistance Bed Mobility: Supine to Sit;Sit to Supine     Supine to sit: Min assist;HOB elevated Sit to supine: Mod assist   General bed mobility comments: supine to sit with HOB 35 degrees requiring assist to clear legs and elevate trunk. Return to bed with assist to bring bil LE onto surface    Transfers Overall transfer level: Needs assistance     Sit to Stand: Mod assist         General transfer comment:  physical assist to stand from bed with UE hooking and sacral tactile assist to rise and side step toward [facility]  Ambulation/Gait             General Gait Details: unable   Stairs             Wheelchair Mobility    Modified Rankin (Stroke Patients Only)       Balance Overall balance assessment: Needs assistance Sitting-balance support: No upper extremity supported;Feet supported Sitting balance-Leahy Scale: Fair Sitting balance - Comments: eOB without assist 9 min                                    Cognition Arousal/Alertness: Awake/alert Behavior During Therapy: WFL for tasks assessed/performed Overall Cognitive Status: Impaired/Different from baseline                                 General Comments: decreased awareness of true medical complexity and deficits      Exercises      General Comments        Pertinent Vitals/Pain Pain Assessment: No/denies pain    Home Living                      Prior Function            PT Goals (current goals can now  be found in the care plan section) Progress towards PT goals: Not progressing toward goals - comment (limited by pulmonary status)    Frequency    Min 2X/week      PT Plan Current plan remains appropriate    Co-evaluation              AM-PAC PT "6 Clicks" Mobility   Outcome Measure  Help needed turning from your back to your side while in a flat bed without using bedrails?: A Little Help needed moving from lying on your back to sitting on the side of a flat bed without using bedrails?: A Little Help needed moving to and from a bed to a chair (including a wheelchair)?: A Lot Help needed standing up from a chair using your arms (e.g., wheelchair or bedside chair)?: A Lot Help needed to walk in hospital room?: Total Help needed climbing 3-5 steps with a railing? : Total 6 Click Score: 12    End of Session Equipment Utilized During Treatment:  Oxygen Activity Tolerance: Patient limited by fatigue Patient left: in bed;with call bell/phone within reach Nurse Communication: Mobility status;Precautions PT Visit Diagnosis: Other abnormalities of gait and mobility (R26.89);Muscle weakness (generalized) (M62.81)     Time: 2671-2458 PT Time Calculation (min) (ACUTE ONLY): 27 min  Charges:  $Therapeutic Activity: 23-37 mins                     Norrine Ballester P, PT Acute Rehabilitation Services Pager: [phone] Office: [place] 10/02/2020, 1:46 PM

## 2020-10-02 NOTE — Progress Notes (Signed)
[facility] PROGRESS NOTE  Ayrabella Labombard HGD:[id number] DOB: Jul 13, 1956 DOA: 08/18/2020 PCP: Glendale Chard, MD  Status: Remains inpatient appropriate because:Unsafe d/c plan, IV treatments appropriate due to intensity of illness or inability to take PO and Inpatient level of care appropriate due to severity of illness   Dispo:  Patient From:  HOME  Planned Disposition:  SNF  Expected discharge date: 10/03/2020  Medically stable for discharge:  NO  Difficult to place: Yes; Barriers to DC: Continues to require very high levels of oxygen secondary to Covid lung injury.  She is physically deconditioned and requires SNF for rehabilitative therapies but given high level of oxygen requirements not a candidate for SNF at this time.   Level of care: Progressive  Code Status: DNR  Family Communication: Patient; daughter Mendel Ryder 2/21.   DVT prophylaxis: Lovenox Vaccination status: Fully vaccinated against Covid prior to admission.  Initial vaccine November 2021 with follow-up vaccines March and April 2021  Foley catheter: No  HPI: 25 old female with history of renal transplant x2 on chronic immunosuppression agents, diabetes, hypothyroidism who presented with shortness of breath initially.  She was found to be in acute hypoxic respiratory failure due to Covid pneumonia.  Hospital course was prolonged and was complicated by slow improvement from severe hypoxia, AKI and non-STEMI.  Continues to require high flow oxygen.  Patient was a DNR until 2/10 at which point she changed back to full code. Further discussions held w/ pt and dtr and code status changed back to DNR on 2/12   Subjective: Awake.  Laying supine in bed having a.m. care performed.  Patient noted to be comfortable without any tachypnea or increased work of breathing while laying flat.  Patient reports that she feels much better today.  Objective: Vitals:   10/02/20 0429 10/02/20 0500  BP: (!) 141/65 132/63   Pulse: 98   Resp: (!) 21 (!) 32  Temp: 98 F (36.7 C)   SpO2: 96% 95%    Intake/Output Summary (Last 24 hours) at 10/02/2020 0720 Last data filed at 10/02/2020 0500 Gross per 24 hour  Intake 480 ml  Output 1050 ml  Net -570 ml   Filed Weights   09/30/20 0500 10/01/20 0500 10/02/20 0500  Weight: 85.1 kg 86.3 kg 85.9 kg    Exam:  General: Awake and alert and in no acute distress Respiratory: Anterior lung sounds are clear to auscultation and diminished in the bases.  No increased work of breathing and only minimal tachypnea despite currently being positioned supine. 30 L/min HHFNC 80% FiO2 with O2 sats is 91 to 97% Cardiovascular: Normal heart sounds, continues with intermittent tachycardia especially with activity and talking.  Extremities warm without edema Abdomen: Soft nontender nondistended with normoactive bowel sounds.  LBM 2/23-fair to marginal oral intake Neurologic: CN 2-12 grossly intact. Sensation intact, DTR normal. Strength 3-4/5 x all 4 extremities.  Psychiatric: Alert and oriented x3, pleasant mood.   Assessment/Plan: Acute problems: Sepsis /acute on chronic hypoxic respiratory failure due to Covid pneumonia/new hospital associated pneumonia as of 2/14:  Fully vaccinated patient.  Immunocompromised status due to renal transplant, immunosuppressive medications.   Presented with multifocal pneumonia from Covid.  Completed treatment with IV steroids, baricitinib, remdesivir.  Also was treated with IV antibiotics for superimposed bacterial pneumonia.   Recent CT of the chest demonstrates progressive pulmonary fibrosis secondary to COVID.   FiO2 had been weaned down to 60% but patient demonstrated increased work of breathing and tachycardia and reports of feeling cold.  FiO2 has subsequently been increased back up to 80% with improvement in vital signs.  Unfortunately I suspect patient's post Covid pulmonary fibrosis is worsening/declaring itself and it appears highly  unlikely that her pulmonary function will improve.  I anticipate we may need to involve palliative care to discuss goals of care with patient and family Continue pulmonary toileting, incentive spirometer/flutter valve.   Continue scheduled Ativan every 12 hours and IV Dilaudid 0.5 mg every 4 hours prn for air hunger and associated anxiety Completed 7 days of cefepime  Continue prednisone to 50 mg BID and taper slowly.  Seems more comfortable with transition from Solu-Medrol to prednisone so may hold on taper to see if FiO2 will decrease first before beginning steroid taper.  DKA/diabetes mellitus on insulin with hyperglycemia DKA resolved.  No longer on insulin drip.   Continue Levemir to 30 units given change to Prednisone- cont resistant SSI  Continue Novolog MC 2 units  CBGs have improved with adjustments in long-acting insulin and are now running between 69 and 169 Hemoglobin A1c of 8.2 as per 1/11.   2/23 CBG at 8 AM had decreased to 45 2/2 missed doses of IV Solumedrol-have changed to Prednisone  AKI on CKD stage IV/transplant kidney:  Transplant x2.  AKI was most likely hemodynamically mediated.   Creatinine has remained acutely around 1.7.   Nephrology has signed off  Continue prednisone, Prograf and Myfortic  Avoid nephrotoxic medications  Chronic normocytic anemia:  Most likely associated  with CKD.  No signs of bleeding.   Status post 1 unit of PRBC transfusion on 09/11/2020.   Hemoglobin 11.8 on 2/21  Deconditioning/debility:  PT/OT recommended skilled nursing facility on discharge but current O2 requirements are too high to facilitate discharge to skilled nursing facility. LTAC reviewed chart but FiO2 must be </= 60%  Stage I decubitus coccyx Pressure Injury 08/20/20 Coccyx Mid Stage 1 -  Intact skin with non-blanchable redness of a localized area usually over a bony prominence. (Active)  Date First Assessed/Time First Assessed: 08/20/20 0800   Location: Coccyx  Location  Orientation: Mid  Staging: Stage 1 -  Intact skin with non-blanchable redness of a localized area usually over a bony prominence.  Present on Admission: Yes    Assessments 08/20/2020  9:00 AM 10/01/2020  8:00 PM  Dressing Type Foam - Lift dressing to assess site every shift Foam - Lift dressing to assess site every shift  Dressing Changed Clean;Dry  Dressing Change Frequency Every 3 days Every 3 days  Site / Wound Assessment Clean;Dry;Pink Clean;Dry;Pink  Peri-wound Assessment -- Intact  Wound Length (cm) 5 cm --  Wound Width (cm) 0.1 cm --  Wound Depth (cm) 0 cm --  Wound Surface Area (cm^2) 0.5 cm^2 --  Wound Volume (cm^3) 0 cm^3 --  Drainage Amount None --  Treatment Cleansed;Off loading --     No Linked orders to display     Wound / Incision (Open or Dehisced) 08/31/20 Puncture Abdomen Left;Lower From Heparin SQ injections. (Active)  Date First Assessed/Time First Assessed: 08/31/20 2000   Wound Type: Puncture  Location: Abdomen  Location Orientation: Left;Lower  Wound Description (Comments): From Heparin SQ injections.  Present on Admission: No    Assessments 08/31/2020  8:00 PM 10/01/2020  8:00 PM  Dressing Status -- Clean;Dry;Intact  Wound Length (cm) 0 cm --  Wound Width (cm) 0 cm --  Wound Depth (cm) 0 cm --  Wound Volume (cm^3) 0 cm^3 --  Wound Surface Area (cm^2)  0 cm^2 --     No Linked orders to display       Other problems: Transaminitis:  Resolved likely secondary to COVID meds as well as hypoperfusion  Right upper quadrant ultrasound is nonacute and showed some sludge/gallstones but no signs of cholecystitis.   Hepatitis panel negative.    Yeast vaginitis -Likely secondary to recent antibiotics, steroids and resultant -Began intravaginal antifungals on 2/21 x 3 days  NSTEMI:  Patient with chest pain on 1/23 with EKG changes.   Treated with Nitroglycerin, morphine and beta-blocker.   Echo is reassuring with preserved ejection fraction.   Continue  aspirin/statin completed 48 hours of IV heparin.   Cardiology signed off.   Sinus pause: Happened on  1/20 with 4.5 seconds pause.   Beta-blocker discontinued.  Currently stable.  Thrombocytopenia:  Most likely associated with acute illness.  Continue to monitor,stable  Complicated UTI:  Completed course of Rocephin.  Hypothyroidism:  Continue Synthyroid.   Repeat TFT in 3 months  Hypertension:  Currently blood pressure stable.  Continue medications  Hyperlipidemia:  Statin held due to elevated LFTs.   LFTs have normalized so can consider resumption of statin although oral intake remains variable  Obesity:  BMI 33.9  Nutrition Status: Estimated body mass index is 34.64 kg/m as calculated from the following:   Height as of this encounter: 5\' 2"  (1.575 m).   Weight as of this encounter: 85.9 kg.  Poor oral intake 2/2 air hunger          Data Reviewed: Basic Metabolic Panel: Recent Labs  Lab 09/27/20 0126  NA 139  K 4.2  CL 109  CO2 20*  GLUCOSE 137*  BUN 45*  CREATININE 1.92*  CALCIUM 10.1   Liver Function Tests: No results for input(s): AST, ALT, ALKPHOS, BILITOT, PROT, ALBUMIN in the last 168 hours. No results for input(s): LIPASE, AMYLASE in the last 168 hours. No results for input(s): AMMONIA in the last 168 hours. CBC: Recent Labs  Lab 09/27/20 0126 09/30/20 0120  WBC 7.3 12.1*  HGB 10.6* 11.8*  HCT 31.5* 36.0  MCV 81.0 82.6  PLT 143* 190   Cardiac Enzymes: No results for input(s): CKTOTAL, CKMB, CKMBINDEX, TROPONINI in the last 168 hours. BNP (last 3 results) Recent Labs    09/11/20 0155 09/12/20 0113 09/15/20 0028  BNP 86.8 80.1 80.0    ProBNP (last 3 results) No results for input(s): PROBNP in the last 8760 hours.  CBG: Recent Labs  Lab 10/01/20 0813 10/01/20 1212 10/01/20 1624 10/01/20 2007 10/02/20 0201  GLUCAP 45* 136* 134* 69* 81    Recent Results (from the past 240 hour(s))  Culture, blood (routine x 2)      Status: None   Collection Time: 09/22/20  8:26 AM   Specimen: BLOOD LEFT HAND  Result Value Ref Range Status   Specimen Description BLOOD LEFT HAND  Final   Special Requests   Final    BOTTLES DRAWN AEROBIC ONLY Blood Culture adequate volume   Culture   Final    NO GROWTH 5 DAYS Performed at [facility], [address] [place], [place] [address]    Report Status 09/27/2020 FINAL  Final  Culture, blood (routine x 2)     Status: None   Collection Time: 09/22/20  8:27 AM   Specimen: BLOOD LEFT ARM  Result Value Ref Range Status   Specimen Description BLOOD LEFT ARM  Final   Special Requests   Final  BOTTLES DRAWN AEROBIC ONLY Blood Culture adequate volume   Culture   Final    NO GROWTH 5 DAYS Performed at [facility], [place] [address], [place], [place] [address]    Report Status 09/27/2020 FINAL  Final  Aspergillus Ag, BAL/Serum     Status: None   Collection Time: 09/23/20  1:01 AM   Specimen: Vein; Blood  Result Value Ref Range Status   Aspergillus Ag, BAL/Serum 0.03 0.00 - 0.49 Index Final    Comment: (NOTE) Performed At: [facility] [place], [place] [id number] Rush Farmer MD WN:[id number]   MRSA PCR Screening     Status: None   Collection Time: 09/23/20  9:30 AM   Specimen: Nasal Mucosa; Nasopharyngeal  Result Value Ref Range Status   MRSA by PCR NEGATIVE NEGATIVE Final    Comment:        The GeneXpert MRSA Assay (FDA approved for NASAL specimens only), is one component of a comprehensive MRSA colonization surveillance program. It is not intended to diagnose MRSA infection nor to guide or monitor treatment for MRSA infections. Performed at [facility], [place] [address] [place], [place] [address]      Studies: No results found.  Scheduled Meds: . amLODipine  10 mg Oral Daily  . vitamin C  500 mg Oral Daily  . aspirin EC  81 mg Oral Daily  . enoxaparin (LOVENOX) injection  30 mg Subcutaneous Q24H  . feeding  supplement (NEPRO CARB STEADY)  237 mL Oral BID BM  . fluticasone  1 spray Each Nare Daily  . folic acid  1 mg Oral Daily  . hydrALAZINE  50 mg Oral Q8H  . insulin aspart  0-20 Units Subcutaneous TID WC  . insulin aspart  2 Units Subcutaneous TID WC  . insulin detemir  30 Units Subcutaneous BID  . isosorbide mononitrate  60 mg Oral Daily  . levothyroxine  50 mcg Oral Q0600  . LORazepam  1 mg Oral BID  . mouth rinse  15 mL Mouth Rinse BID  . mycophenolate  360 mg Oral BID  . pantoprazole  40 mg Oral BID  . predniSONE  50 mg Oral BID WC  . tacrolimus  3 mg Oral QPM  . tacrolimus  4 mg Oral Daily  . zinc sulfate  220 mg Oral Daily   Continuous Infusions: . sodium chloride    . sodium chloride Stopped (08/31/20 1545)    Principal Problem:   ARF (acute renal failure) (HCC) Active Problems:   AKI (acute kidney injury) ([place])   Fever, unspecified   Renal transplant recipient   Immunocompromised ([place])   DM (diabetes mellitus), type 2 with renal complications ([place])   Essential hypertension with goal blood pressure less than 140/90   Acute hypoxemic respiratory failure due to COVID-19 ([place])   Respiratory failure (HCC)   Acute hyperkalemia   Diabetic ketoacidosis without coma associated with type 1 diabetes mellitus ([place])   Pressure injury of skin   Acute and chronic respiratory failure with hypoxia (HCC)   HCAP (healthcare-associated pneumonia)   Pulmonary fibrosis, postinflammatory (HCC)   Transaminitis   Controlled type 2 diabetes mellitus with hyperglycemia, with long-term current use of insulin ([place])   Consultants:  None  Procedures:  Echocardiogram  Antibiotics: Anti-infectives (From admission, onward)   Start     Dose/Rate Route Frequency Ordered Stop   09/24/20 1200  vancomycin (VANCOREADY) IVPB 1000 mg/200 mL  Status:  Discontinued  1,000 mg 200 mL/hr over 60 Minutes Intravenous Every 48 hours 09/22/20 1036 09/24/20 1013   09/22/20 1130  vancomycin  (VANCOREADY) IVPB 1750 mg/350 mL        1,750 mg 175 mL/hr over 120 Minutes Intravenous  Once 09/22/20 1036 09/22/20 1735   09/22/20 1130  ceFEPIme (MAXIPIME) 2 g in sodium chloride 0.9 % 100 mL IVPB        2 g 200 mL/hr over 30 Minutes Intravenous Every 24 hours 09/22/20 1036 09/28/20 0052   09/07/20 1030  vancomycin (VANCOCIN) IVPB 1000 mg/200 mL premix  Status:  Discontinued        1,000 mg 200 mL/hr over 60 Minutes Intravenous Every 48 hours 09/05/20 0933 09/07/20 0951   09/05/20 1030  vancomycin (VANCOREADY) IVPB 2000 mg/400 mL        2,000 mg 200 mL/hr over 120 Minutes Intravenous  Once 09/05/20 0933 09/05/20 2018   09/05/20 1030  ceFEPIme (MAXIPIME) 2 g in sodium chloride 0.9 % 100 mL IVPB  Status:  Discontinued        2 g 200 mL/hr over 30 Minutes Intravenous Every 24 hours 09/05/20 0933 09/07/20 0951   08/20/20 1445  cefTRIAXone (ROCEPHIN) 1 g in sodium chloride 0.9 % 100 mL IVPB        1 g 200 mL/hr over 30 Minutes Intravenous Every 24 hours 08/20/20 1348 08/26/20 2147   08/20/20 1000  remdesivir 100 mg in sodium chloride 0.9 % 100 mL IVPB       "Followed by" Linked Group Details   100 mg 200 mL/hr over 30 Minutes Intravenous Daily 08/19/20 0021 08/23/20 1113   08/19/20 0130  remdesivir 200 mg in sodium chloride 0.9% 250 mL IVPB       "Followed by" Linked Group Details   200 mg 580 mL/hr over 30 Minutes Intravenous Once 08/19/20 0021 08/19/20 0314       Time spent: 20 minutes    Erin Hearing ANP  [facility] 7 am - 330 pm/M-F for direct patient care and secure chat Please refer to Amion for contact info 44  days

## 2020-10-03 DIAGNOSIS — E1121 Type 2 diabetes mellitus with diabetic nephropathy: Secondary | ICD-10-CM | POA: Diagnosis not present

## 2020-10-03 DIAGNOSIS — D849 Immunodeficiency, unspecified: Secondary | ICD-10-CM | POA: Diagnosis not present

## 2020-10-03 DIAGNOSIS — J9621 Acute and chronic respiratory failure with hypoxia: Secondary | ICD-10-CM | POA: Diagnosis not present

## 2020-10-03 DIAGNOSIS — U071 COVID-19: Secondary | ICD-10-CM | POA: Diagnosis not present

## 2020-10-03 LAB — GLUCOSE, CAPILLARY
Glucose-Capillary: 167 mg/dL — ABNORMAL HIGH (ref 70–99)
Glucose-Capillary: 253 mg/dL — ABNORMAL HIGH (ref 70–99)
Glucose-Capillary: 240 mg/dL — ABNORMAL HIGH (ref 70–99)
Glucose-Capillary: 295 mg/dL — ABNORMAL HIGH (ref 70–99)

## 2020-10-03 NOTE — Progress Notes (Signed)
[facility] PROGRESS NOTE  [name] [name] XLK:[id number] DOB: 1956-05-29 DOA: 08/18/2020 PCP: Glendale Chard, MD  Status: Remains inpatient appropriate because:Unsafe d/c plan, IV treatments appropriate due to intensity of illness or inability to take PO and Inpatient level of care appropriate due to severity of illness   Dispo:  Patient From:  HOME  Planned Disposition:  SNF  Expected discharge date: 10/06/2020  Medically stable for discharge:  NO  Difficult to place: Yes; Barriers to DC: Continues to require very high levels of oxygen secondary to Covid lung injury.  She is physically deconditioned and requires SNF for rehabilitative therapies but given high level of oxygen requirements not a candidate for SNF at this time.   Level of care: Progressive  Code Status: DNR  Family Communication: Patient; daughter [name] [name] 2/21.   DVT prophylaxis: Lovenox Vaccination status: Fully vaccinated against Covid prior to admission.  Initial vaccine November 2021 with follow-up vaccines March and April 2021  Foley catheter: No  HPI: 41 old female with history of renal transplant x2 on chronic immunosuppression agents, diabetes, hypothyroidism who presented with shortness of breath initially.  She was found to be in acute hypoxic respiratory failure due to Covid pneumonia.  Hospital course was prolonged and was complicated by slow improvement from severe hypoxia, AKI and non-STEMI.  Continues to require high flow oxygen.  Patient was a DNR until 2/10 at which point she changed back to full code. Further discussions held w/ pt and dtr and code status changed back to DNR on 2/12   Subjective: Awake with increased work of breathing and difficulty speaking noting had just completed therapy session and had been returned to bed.  No specific complaints.  Objective: Vitals:   10/03/20 0452 10/03/20 0632  BP: 138/69 (!) 143/76  Pulse: (!) 105   Resp: 20   Temp: 98.4 F (36.9 C)    SpO2: 94%     Intake/Output Summary (Last 24 hours) at 10/03/2020 0753 Last data filed at 10/03/2020 0453 Gross per 24 hour  Intake --  Output 1450 ml  Net -1450 ml   Filed Weights   09/30/20 0500 10/01/20 0500 10/02/20 0500  Weight: 85.1 kg 86.3 kg 85.9 kg    Exam:  General: Alert, mild distress as evidenced by increased work of breathing post therapy session, calm otherwise Respiratory: Lungs are clear to auscultation with tachypnea and increased work of breathing after therapy session.  Also has baseline resting tachypnea.  20 L/min HHFNC 70% FiO2 with O2 sats is 92 to 96% Cardiovascular: Heart sounds are S1-S2, regular tachycardic pulse, extremities are warm to touch without any peripheral edema Abdomen: Abdomen soft with normoactive bowel sounds.  Nontender to exam.  LBM 2/23-fair to marginal oral intake Neurologic: CN 2-12 grossly intact. Sensation intact, DTR normal. Strength 3-4/5 x all 4 extremities.  Psychiatric: Alert and oriented x3.  Pleasant affect   Assessment/Plan: Acute problems: Sepsis /acute on chronic hypoxic respiratory failure due to Covid pneumonia/new hospital associated pneumonia as of 2/14:  Fully vaccinated patient.  Immunocompromised status due to renal transplant, immunosuppressive medications.   Presented with multifocal pneumonia from Covid.  Completed treatment with IV steroids, baricitinib, remdesivir.  Also was treated with IV antibiotics for superimposed bacterial pneumonia.   Recent CT of the chest demonstrates progressive pulmonary fibrosis secondary to COVID.   FiO2 had been weaned down to 60% but patient demonstrated increased work of breathing and tachycardia and reports of feeling cold.  FiO2 has subsequently been increased back up  to 80% with improvement in vital signs.  Unfortunately I suspect patient's post Covid pulmonary fibrosis is worsening/declaring itself and it appears highly unlikely that her pulmonary function will improve.  I  anticipate we may need to involve palliative care to discuss goals of care with patient and family Continue pulmonary toileting, incentive spirometer/flutter valve.   Continue scheduled Ativan every 12 hours and IV Dilaudid 0.5 mg every 4 hours prn for air hunger and associated anxiety Completed 7 days of cefepime  Continue prednisone to 50 mg BID and taper slowly.  Seems more comfortable with transition from Solu-Medrol to prednisone so may hold on taper to see if FiO2 will decrease first before beginning steroid taper.  DKA/diabetes mellitus on insulin with hyperglycemia DKA resolved.  No longer on insulin drip.   Continue Levemir to 30 units given change to Prednisone- cont resistant SSI  Continue Novolog MC 2 units  CBGs have improved with adjustments in long-acting insulin and are now running between 69 and 169 Hemoglobin A1c of 8.2 as per 1/11.   2/23 CBG at 8 AM had decreased to 45 2/2 missed doses of IV Solumedrol-have changed to Prednisone  AKI on CKD stage IV/transplant kidney:  Transplant x2.  AKI was most likely hemodynamically mediated.   Creatinine has remained acutely around 1.7.   Nephrology has signed off  Continue prednisone, Prograf and Myfortic  Avoid nephrotoxic medications  Chronic normocytic anemia:  Most likely associated  with CKD.  No signs of bleeding.   Status post 1 unit of PRBC transfusion on 09/11/2020.   Hemoglobin 11.8 on 2/21  Deconditioning/debility:  PT/OT recommended skilled nursing facility on discharge but current O2 requirements are too high to facilitate discharge to skilled nursing facility. LTAC reviewed chart but FiO2 must be </= 60%  Stage I decubitus coccyx Pressure Injury 08/20/20 Coccyx Mid Stage 1 -  Intact skin with non-blanchable redness of a localized area usually over a bony prominence. (Active)  Date First Assessed/Time First Assessed: 08/20/20 0800   Location: Coccyx  Location Orientation: Mid  Staging: Stage 1 -  Intact skin with  non-blanchable redness of a localized area usually over a bony prominence.  Present on Admission: Yes    Assessments 08/20/2020  9:00 AM 10/01/2020  8:00 PM  Dressing Type Foam - Lift dressing to assess site every shift Foam - Lift dressing to assess site every shift  Dressing Changed Clean;Dry  Dressing Change Frequency Every 3 days Every 3 days  Site / Wound Assessment Clean;Dry;Pink Clean;Dry;Pink  Peri-wound Assessment -- Intact  Wound Length (cm) 5 cm --  Wound Width (cm) 0.1 cm --  Wound Depth (cm) 0 cm --  Wound Surface Area (cm^2) 0.5 cm^2 --  Wound Volume (cm^3) 0 cm^3 --  Drainage Amount None --  Treatment Cleansed;Off loading --     No Linked orders to display     Wound / Incision (Open or Dehisced) 08/31/20 Puncture Abdomen Left;Lower From Heparin SQ injections. (Active)  Date First Assessed/Time First Assessed: 08/31/20 2000   Wound Type: Puncture  Location: Abdomen  Location Orientation: Left;Lower  Wound Description (Comments): From Heparin SQ injections.  Present on Admission: No    Assessments 08/31/2020  8:00 PM 10/01/2020  8:00 PM  Dressing Status -- Clean;Dry;Intact  Wound Length (cm) 0 cm --  Wound Width (cm) 0 cm --  Wound Depth (cm) 0 cm --  Wound Volume (cm^3) 0 cm^3 --  Wound Surface Area (cm^2) 0 cm^2 --  No Linked orders to display       Other problems: Transaminitis:  Resolved likely secondary to COVID meds as well as hypoperfusion  Right upper quadrant ultrasound is nonacute and showed some sludge/gallstones but no signs of cholecystitis.   Hepatitis panel negative.    Yeast vaginitis -Likely secondary to recent antibiotics, steroids and resultant -Began intravaginal antifungals on 2/21 x 3 days  NSTEMI:  Patient with chest pain on 1/23 with EKG changes.   Treated with Nitroglycerin, morphine and beta-blocker.   Echo is reassuring with preserved ejection fraction.   Continue aspirin/statin completed 48 hours of IV heparin.   Cardiology  signed off.   Sinus pause: Happened on  1/20 with 4.5 seconds pause.   Beta-blocker discontinued.  Currently stable.  Thrombocytopenia:  Most likely associated with acute illness.  Continue to monitor,stable  Complicated UTI:  Completed course of Rocephin.  Hypothyroidism:  Continue Synthyroid.   Repeat TFT in 3 months  Hypertension:  Currently blood pressure stable.  Continue medications  Hyperlipidemia:  Statin held due to elevated LFTs.   LFTs have normalized so can consider resumption of statin although oral intake remains variable  Obesity:  BMI 33.9  Nutrition Status: Estimated body mass index is 34.64 kg/m as calculated from the following:   Height as of this encounter: 5\' 2"  (1.575 m).   Weight as of this encounter: 85.9 kg.  Poor oral intake 2/2 air hunger          Data Reviewed: Basic Metabolic Panel: Recent Labs  Lab 09/27/20 0126  NA 139  K 4.2  CL 109  CO2 20*  GLUCOSE 137*  BUN 45*  CREATININE 1.92*  CALCIUM 10.1   Liver Function Tests: No results for input(s): AST, ALT, ALKPHOS, BILITOT, PROT, ALBUMIN in the last 168 hours. No results for input(s): LIPASE, AMYLASE in the last 168 hours. No results for input(s): AMMONIA in the last 168 hours. CBC: Recent Labs  Lab 09/27/20 0126 09/30/20 0120  WBC 7.3 12.1*  HGB 10.6* 11.8*  HCT 31.5* 36.0  MCV 81.0 82.6  PLT 143* 190   Cardiac Enzymes: No results for input(s): CKTOTAL, CKMB, CKMBINDEX, TROPONINI in the last 168 hours. BNP (last 3 results) Recent Labs    09/11/20 0155 09/12/20 0113 09/15/20 0028  BNP 86.8 80.1 80.0    ProBNP (last 3 results) No results for input(s): PROBNP in the last 8760 hours.  CBG: Recent Labs  Lab 10/02/20 0734 10/02/20 1222 10/02/20 1650 10/02/20 2110 10/03/20 0734  GLUCAP 169* 146* 214* 270* 167*    Recent Results (from the past 240 hour(s))  MRSA PCR Screening     Status: None   Collection Time: 09/23/20  9:30 AM   Specimen:  Nasal Mucosa; Nasopharyngeal  Result Value Ref Range Status   MRSA by PCR NEGATIVE NEGATIVE Final    Comment:        The GeneXpert MRSA Assay (FDA approved for NASAL specimens only), is one component of a comprehensive MRSA colonization surveillance program. It is not intended to diagnose MRSA infection nor to guide or monitor treatment for MRSA infections. Performed at [facility], [address] [place], [place] [address]      Studies: No results found.  Scheduled Meds: . amLODipine  10 mg Oral Daily  . vitamin C  500 mg Oral Daily  . aspirin EC  81 mg Oral Daily  . enoxaparin (LOVENOX) injection  30 mg Subcutaneous Q24H  . feeding supplement (NEPRO CARB  STEADY)  237 mL Oral BID BM  . fluticasone  1 spray Each Nare Daily  . folic acid  1 mg Oral Daily  . hydrALAZINE  50 mg Oral Q8H  . insulin aspart  0-20 Units Subcutaneous TID WC  . insulin aspart  2 Units Subcutaneous TID WC  . insulin detemir  30 Units Subcutaneous BID  . isosorbide mononitrate  60 mg Oral Daily  . levothyroxine  50 mcg Oral Q0600  . LORazepam  1 mg Oral BID  . mouth rinse  15 mL Mouth Rinse BID  . mycophenolate  360 mg Oral BID  . pantoprazole  40 mg Oral BID  . predniSONE  50 mg Oral BID WC  . tacrolimus  3 mg Oral QPM  . tacrolimus  4 mg Oral Daily  . zinc sulfate  220 mg Oral Daily   Continuous Infusions: . sodium chloride    . sodium chloride Stopped (08/31/20 1545)    Principal Problem:   ARF (acute renal failure) (HCC) Active Problems:   AKI (acute kidney injury) ([place])   Fever, unspecified   Renal transplant recipient   Immunocompromised ([place])   DM (diabetes mellitus), type 2 with renal complications ([place])   Essential hypertension with goal blood pressure less than 140/90   Acute hypoxemic respiratory failure due to COVID-19 ([place])   Respiratory failure (HCC)   Acute hyperkalemia   Diabetic ketoacidosis without coma associated with type 1 diabetes mellitus ([place])    Pressure injury of skin   Acute and chronic respiratory failure with hypoxia ([place])   HCAP (healthcare-associated pneumonia)   Pulmonary fibrosis, postinflammatory (HCC)   Transaminitis   Controlled type 2 diabetes mellitus with hyperglycemia, with long-term current use of insulin ([place])   Consultants:  None  Procedures:  Echocardiogram  Antibiotics: Anti-infectives (From admission, onward)   Start     Dose/Rate Route Frequency Ordered Stop   09/24/20 1200  vancomycin (VANCOREADY) IVPB 1000 mg/200 mL  Status:  Discontinued        1,000 mg 200 mL/hr over 60 Minutes Intravenous Every 48 hours 09/22/20 1036 09/24/20 1013   09/22/20 1130  vancomycin (VANCOREADY) IVPB 1750 mg/350 mL        1,750 mg 175 mL/hr over 120 Minutes Intravenous  Once 09/22/20 1036 09/22/20 1735   09/22/20 1130  ceFEPIme (MAXIPIME) 2 g in sodium chloride 0.9 % 100 mL IVPB        2 g 200 mL/hr over 30 Minutes Intravenous Every 24 hours 09/22/20 1036 09/28/20 0052   09/07/20 1030  vancomycin (VANCOCIN) IVPB 1000 mg/200 mL premix  Status:  Discontinued        1,000 mg 200 mL/hr over 60 Minutes Intravenous Every 48 hours 09/05/20 0933 09/07/20 0951   09/05/20 1030  vancomycin (VANCOREADY) IVPB 2000 mg/400 mL        2,000 mg 200 mL/hr over 120 Minutes Intravenous  Once 09/05/20 0933 09/05/20 2018   09/05/20 1030  ceFEPIme (MAXIPIME) 2 g in sodium chloride 0.9 % 100 mL IVPB  Status:  Discontinued        2 g 200 mL/hr over 30 Minutes Intravenous Every 24 hours 09/05/20 0933 09/07/20 0951   08/20/20 1445  cefTRIAXone (ROCEPHIN) 1 g in sodium chloride 0.9 % 100 mL IVPB        1 g 200 mL/hr over 30 Minutes Intravenous Every 24 hours 08/20/20 1348 08/26/20 2147   08/20/20 1000  remdesivir 100 mg in sodium chloride 0.9 % 100 mL  IVPB       "Followed by" Linked Group Details   100 mg 200 mL/hr over 30 Minutes Intravenous Daily 08/19/20 0021 08/23/20 1113   08/19/20 0130  remdesivir 200 mg in sodium chloride 0.9% 250 mL  IVPB       "Followed by" Linked Group Details   200 mg 580 mL/hr over 30 Minutes Intravenous Once 08/19/20 0021 08/19/20 0314       Time spent: 20 minutes    Erin Hearing ANP  [facility] 7 am - 330 pm/M-F for direct patient care and secure chat Please refer to Amion for contact info 45  days

## 2020-10-03 NOTE — Plan of Care (Signed)

## 2020-10-03 NOTE — TOC Progression Note (Addendum)
Transition of Care [facility]) - Progression Note    Patient Details  Name: [name] [name] MRN: [id number] Date of Birth: 07-10-1956  Transition of Care [facility]) CM/SW Contact  Curlene Labrum, RN Phone Number: 10/03/2020, 12:20 PM  Clinical Narrative:    Case management met with the patient at the bedside regarding transitions of care.  The patient remains on 70% HFNC oxygen requirements at the bedside.  Case management will continue to follow the patient for LTAC placement at [facility] once the patient's oxygen requirements are less than 60% and I can start insurance authorization with [facility] LT[facility] for placement.  The patient continues to be in agreement for transfer to an LTAC when appropriate.  I left a message with Arley Phenix, CM with [facility] to check on available admission beds once patient's oxygen requirements are appropriate for admission.  CM and MSW will continue to follow the patient for admission to [facility] facility when appropriate.  10/03/2020 1340 - I spoke with Arley Phenix, CM at [facility] and they currently do not have a bed opening but are continuing to review the patient's clinicals.  If the patient's oxygen requirements are less than 65% on HFNC - I can start insurance authorization on Monday for possible admission bed opening at [facility].   Expected Discharge Plan: Long Term Acute Care (LTAC) Barriers to Discharge: Continued Medical Work up  Expected Discharge Plan and Services Expected Discharge Plan: [place] (LTAC) In-house Referral: Clinical Social Work Discharge Planning Services: CM Consult Post Acute Care Choice: [place] arrangements for the past 2 months: Single Family Home                                       Social Determinants of Health (SDOH) Interventions    Readmission Risk Interventions Readmission Risk Prevention  Plan 09/19/2020  Transportation Screening Complete  Medication Review [employer]) Complete  PCP or Specialist appointment within 3-5 days of discharge Complete  HRI or Home Care Consult Complete  SW Recovery Care/Counseling Consult Complete  Palliative Care Screening Complete  Skilled Nursing Facility Complete  Some recent data might be hidden

## 2020-10-03 NOTE — Progress Notes (Signed)
Occupational Therapy Treatment Patient Details Name: [name] [name] MRN: [id number] DOB: Jul 09, 1956 Today's Date: 10/03/2020    History of present illness Pt is a 65 y.o. female who tested (+) COVID-19 on 08/13/20, now admitted 08/18/20 with worsening SOB, cough, chest pain and body aches. Acute hypoxic respiratory failure due to COVID-19 PNA, DKA, AKI on CKD, UTI. CP on 1/22 with NSTEMI . PMHx: HTN, DM2, chronic steroid use, s/p renal transplant x2.   OT comments  Pt with gradual progress towards OT goals, continues to be limited by severe cardiopulmonary tolerance deficits. Pt tearful initially, reporting " I just want to get better" but motivated to attempts participate with therapy. Pt reported need for BM but unable to make it to [facility] today, so OT assisted with bed pan use and clear-up afterwards. Pt able to sit EOB 10-15 min and demo LE exercises. Increased time needed to recover after all activities. Encouraged pt to make goals of simple tasks she wanted to complete during the day, prioritize and space them out accordingly to allow rest time in between. Collaborated with staff on EOB activities with OT, plan to sitting up in chair for lunch and bath with NT in afternoon within pt's tolerance. Plan to start next therapy session with transfer to chair initially based on anticipated energy expenditure.  Received on 25 L HHFNC, 70% FiO2 with 93% at rest. Desats to 82% with toileting using bedpan, 81% after bed mobility to sit EOB, and 80% with exercises at end of session. Pt requires > 5 min to recover to 85-86% without use of NRB mask.     Follow Up Recommendations  SNF;LTACH;Supervision/Assistance - 24 hour    Equipment Recommendations  3 in 1 bedside commode;Hospital bed;Wheelchair (measurements OT);Wheelchair cushion (measurements OT)    Recommendations for Other Services      Precautions / Restrictions Precautions Precautions: Fall Precaution Comments: watch SpO2 quick to  desaturate with HHFNC Restrictions Weight Bearing Restrictions: No       Mobility Bed Mobility Overal bed mobility: Needs Assistance Bed Mobility: Supine to Sit;Sit to Supine;Rolling Rolling: Supervision   Supine to sit: Min assist;HOB elevated Sit to supine: Mod assist;HOB elevated   General bed mobility comments: Supervision for rolling for bedpan placement, Min A for trunk advancement to EOB and Mod A to place B LE back into bed    Transfers                      Balance Overall balance assessment: Needs assistance Sitting-balance support: No upper extremity supported;Feet supported Sitting balance-Leahy Scale: Fair Sitting balance - Comments: EOB without assist 10-15 min                                   ADL either performed or assessed with clinical judgement   ADL Overall ADL's : Needs assistance/impaired                             Toileting- Clothing Manipulation and Hygiene: Maximal assistance;Bed level Toileting - Clothing Manipulation Details (indicate cue type and reason): reports unable to make it to [facility] for BM today, assisted with bedpan and peri care afterwards       General ADL Comments: Limited by severe cardiopulmonary tolerance deficits. Reinforced energy conservation strategies with focus on planning, prioritizing and pacing with activities. (EOB exercises with therapy, rest, then sit  up for lunch and rest on return back to bed and bath in afternoon). Encouraged pt to make goals of what she would like to complete in a day (i.e if wanting to transfer to chair, do this at beginning of therapy session rather than end due to fatigue and lower O2 levels)     Vision   Vision Assessment?: No apparent visual deficits   Perception     Praxis      Cognition Arousal/Alertness: Awake/alert Behavior During Therapy: WFL for tasks assessed/performed Overall Cognitive Status: Impaired/Different from baseline Area of  Impairment: Safety/judgement;Awareness                         Safety/Judgement: Decreased awareness of deficits Awareness: Emergent   General Comments: decreased awareness of true medical complexity and deficits        Exercises Exercises: General Lower Extremity General Exercises - Lower Extremity Quad Sets: AROM;Both;20 reps;Seated   Shoulder Instructions       General Comments Pt received on 25 L HHFNC, 70% FiO2 with SpO2 93% at rest. Desats to 82% after toileting with bedpan, 81% after bed mobility to EOB and 80% after exercises sitting EOB. > 5 min needed to recover to 86% sustained during session. RR 30s-40s sustained but with slow breathing EOB, RR 24    Pertinent Vitals/ Pain       Pain Assessment: No/denies pain  Home Living                                          Prior Functioning/Environment              Frequency  Min 2X/week        Progress Toward Goals  OT Goals(current goals can now be found in the care plan section)  Progress towards OT goals: OT to reassess next treatment  Acute Rehab OT Goals Patient Stated Goal: to breathe better, be able to get up and get to chair, walk to bathroom, do what I was doing before OT Goal Formulation: With patient Time For Goal Achievement: 10/08/20 Potential to Achieve Goals: Good ADL Goals Pt Will Perform Grooming: with set-up;sitting Pt Will Perform Lower Body Bathing: with min assist;sit to/from stand Pt Will Perform Upper Body Dressing: with min assist;sitting Pt Will Perform Lower Body Dressing: with mod assist;sit to/from stand Pt Will Transfer to Toilet: with min guard assist;stand pivot transfer;bedside commode Pt Will Perform Toileting - Clothing Manipulation and hygiene: with min assist;sit to/from stand Pt/caregiver will Perform Home Exercise Program: Increased strength;Both right and left upper extremity;With written HEP provided;Independently;With theraband Additional  ADL Goal #1: Pt will independently demonstrate carryover of energy conservation techniques during ADL/functional task.  Plan Discharge plan remains appropriate    Co-evaluation                 AM-PAC OT "6 Clicks" Daily Activity     Outcome Measure   Help from another person eating meals?: None Help from another person taking care of personal grooming?: A Little Help from another person toileting, which includes using toliet, bedpan, or urinal?: A Lot Help from another person bathing (including washing, rinsing, drying)?: A Lot Help from another person to put on and taking off regular upper body clothing?: A Little Help from another person to put on and taking off regular lower body clothing?: A Lot 6  Click Score: 16    End of Session Equipment Utilized During Treatment: Oxygen  OT Visit Diagnosis: Unsteadiness on feet (R26.81)   Activity Tolerance Treatment limited secondary to medical complications (Comment)   Patient Left in bed;with call bell/phone within reach   Nurse Communication          Time: 4784-1282 OT Time Calculation (min): 46 min  Charges: OT General Charges $OT Visit: 1 Visit OT Treatments $Self Care/Home Management : 8-22 mins $Therapeutic Activity: 8-22 mins $Therapeutic Exercise: 8-22 mins  Malachy Chamber, OTR/L Acute Rehab Services Office: [phone]   Layla Maw 10/03/2020, 10:55 AM

## 2020-10-04 DIAGNOSIS — R0602 Shortness of breath: Secondary | ICD-10-CM

## 2020-10-04 DIAGNOSIS — E875 Hyperkalemia: Secondary | ICD-10-CM | POA: Diagnosis not present

## 2020-10-04 DIAGNOSIS — J9601 Acute respiratory failure with hypoxia: Secondary | ICD-10-CM | POA: Diagnosis not present

## 2020-10-04 DIAGNOSIS — U071 COVID-19: Secondary | ICD-10-CM | POA: Diagnosis not present

## 2020-10-04 DIAGNOSIS — J9621 Acute and chronic respiratory failure with hypoxia: Secondary | ICD-10-CM | POA: Diagnosis not present

## 2020-10-04 LAB — GLUCOSE, CAPILLARY
Glucose-Capillary: 48 mg/dL — ABNORMAL LOW (ref 70–99)
Glucose-Capillary: 77 mg/dL (ref 70–99)
Glucose-Capillary: 113 mg/dL — ABNORMAL HIGH (ref 70–99)
Glucose-Capillary: 226 mg/dL — ABNORMAL HIGH (ref 70–99)
Glucose-Capillary: 232 mg/dL — ABNORMAL HIGH (ref 70–99)

## 2020-10-04 MED ORDER — INSULIN DETEMIR 100 UNIT/ML ~~LOC~~ SOLN
25.0000 [IU] | Freq: Two times a day (BID) | SUBCUTANEOUS | Status: DC
  Administered 2020-10-04 – 2020-10-13 (×19): 25 [IU] via SUBCUTANEOUS
  Filled 2020-10-04 (×19): qty 0.25

## 2020-10-04 MED ORDER — HYDROMORPHONE HCL 1 MG/ML PO LIQD
1.0000 mg | ORAL | Status: DC | PRN
  Administered 2020-10-04: 1 mg via ORAL
  Filled 2020-10-04: qty 1

## 2020-10-04 MED ORDER — ALBUTEROL SULFATE (2.5 MG/3ML) 0.083% IN NEBU
INHALATION_SOLUTION | RESPIRATORY_TRACT | Status: AC
  Administered 2020-10-04: 2.5 mg
  Filled 2020-10-04: qty 3

## 2020-10-04 NOTE — Progress Notes (Signed)
R.T. requested to come evaluate the patient and discuss need for additional interventions.

## 2020-10-04 NOTE — Progress Notes (Signed)
RT was called to come assess pt. RT spoke with RN. RN stated that pt's SpO2 has been maintaining in high 80's. RT at bedside to find pt's SpO2 88% on HHFNC 30L 65% with increased WOB and SOB. RT gave pt PRN breathing treatment with little improvement. RT increased [place] settings to 40L & 90%/ Pt's SpO2 now at 93%.

## 2020-10-04 NOTE — Progress Notes (Signed)
PROGRESS NOTE  [name] [name] AUQ:[id number] DOB: 1956-03-17 DOA: 08/18/2020 PCP: Glendale Chard, MD   LOS: 46 days   Brief narrative:  Patient is a 65 years old female with past medical history of renal transplant x2 on chronic immunosuppressants, diabetes mellitus type 2, hypothyroidism who presented to the hospital initially with shortness of breath.  He was then admitted for acute hypoxic respiratory failure secondary to Covid pneumonia.  Her hospital course was prolonged and slow due to severe hypoxia, acute kidney injury and non-ST elevation MI.  Patient has been requiring high flow oxygen.  At this time, patient is awaiting for placement at a skilled nursing facility.  Assessment/Plan:  Principal Problem:   ARF (acute renal failure) (HCC) Active Problems:   AKI (acute kidney injury) ([place])   Fever, unspecified   Renal transplant recipient   Immunocompromised ([place])   DM (diabetes mellitus), type 2 with renal complications ([place])   Essential hypertension with goal blood pressure less than 140/90   Acute hypoxemic respiratory failure due to COVID-19 ([place])   Respiratory failure (HCC)   Acute hyperkalemia   Diabetic ketoacidosis without coma associated with type 1 diabetes mellitus ([place])   Pressure injury of skin   Acute and chronic respiratory failure with hypoxia (HCC)   HCAP (healthcare-associated pneumonia)   Pulmonary fibrosis, postinflammatory (HCC)   Transaminitis   Controlled type 2 diabetes mellitus with hyperglycemia, with long-term current use of insulin ([place])   Sepsis secondary to pneumonia. Completed IV antibiotics for superimposed bacterial pneumonia.  Patient had multifocal pneumonia from Covid infection   Acute on chronic hypoxic respiratory failure secondary to Covid pneumonia and hospital-acquired pneumonia. Patient completed treatment with IV steroids, baricitinib, remdesivir.  Also completed IV antibiotics for superimposed infection.  CT scan of the  chest shows progressive pulmonary fibrosis.  Currently on high flow oxygen.  On prednisone p.o. twice daily.  Awaiting improvement in FiO2 prior to discharge.  Unlikely that her pulmonary function would improve due to progressive fibrosis.  Diabetes mellitus on insulin with hyperglycemia, diabetic ketoacidosis. Initially patient required insulin drip.  Currently on Levemir resistant scale sliding scale insulin.  Hemoglobin A1c of 8.2 from 08/19/2020.  Currently on prednisone, we will continue to monitor closely.    AKI on CKD stage IV/transplant kidney:  Nephrology was initially consulted.  Currently nephrology has signed off.  Latest creatinine was around 1.7.  Continue prednisone Prograf and Myfortic.     Chronic normocytic anemia secondary to anemia of CKD.:  She received 1 unit of packed RBC on 09/11/2020.  Latest hemoglobin was 11.8 on 221.     Deconditioning/debility:  PT/OT recommended skilled nursing facility on discharge but current O2 requirements are too high to facilitate discharge to skilled nursing facility. LTAC reviewed chart but FiO2 must be </= 60%  DVT prophylaxis: enoxaparin (LOVENOX) injection 30 mg Start: 09/20/20 1400 SCDs Start: 08/19/20 0933   Code Status:  DNR  Family Communication: None  Status is: Inpatient  Remains inpatient appropriate because:IV treatments appropriate due to intensity of illness or inability to take PO, Inpatient level of care appropriate due to severity of illness and Pending DC planning, unsafe DC plan  Dispo:  Patient From:  Home  Planned Disposition:  Skilled nursing facility Expected discharge uncertain at this time  Medically stable for discharge:  No Barriers to discharge yes.  On high levels of oxygen, deconditioning requiring skilled nursing facility. Difficulty place: yes     Consultants:  None  Procedures:  2 D Echocardiogram  Anti-infectives:  . None  Anti-infectives (From admission, onward)   Start     Dose/Rate  Route Frequency Ordered Stop   09/24/20 1200  vancomycin (VANCOREADY) IVPB 1000 mg/200 mL  Status:  Discontinued        1,000 mg 200 mL/hr over 60 Minutes Intravenous Every 48 hours 09/22/20 1036 09/24/20 1013   09/22/20 1130  vancomycin (VANCOREADY) IVPB 1750 mg/350 mL        1,750 mg 175 mL/hr over 120 Minutes Intravenous  Once 09/22/20 1036 09/22/20 1735   09/22/20 1130  ceFEPIme (MAXIPIME) 2 g in sodium chloride 0.9 % 100 mL IVPB        2 g 200 mL/hr over 30 Minutes Intravenous Every 24 hours 09/22/20 1036 09/28/20 0052   09/07/20 1030  vancomycin (VANCOCIN) IVPB 1000 mg/200 mL premix  Status:  Discontinued        1,000 mg 200 mL/hr over 60 Minutes Intravenous Every 48 hours 09/05/20 0933 09/07/20 0951   09/05/20 1030  vancomycin (VANCOREADY) IVPB 2000 mg/400 mL        2,000 mg 200 mL/hr over 120 Minutes Intravenous  Once 09/05/20 0933 09/05/20 2018   09/05/20 1030  ceFEPIme (MAXIPIME) 2 g in sodium chloride 0.9 % 100 mL IVPB  Status:  Discontinued        2 g 200 mL/hr over 30 Minutes Intravenous Every 24 hours 09/05/20 0933 09/07/20 0951   08/20/20 1445  cefTRIAXone (ROCEPHIN) 1 g in sodium chloride 0.9 % 100 mL IVPB        1 g 200 mL/hr over 30 Minutes Intravenous Every 24 hours 08/20/20 1348 08/26/20 2147   08/20/20 1000  remdesivir 100 mg in sodium chloride 0.9 % 100 mL IVPB       "Followed by" Linked Group Details   100 mg 200 mL/hr over 30 Minutes Intravenous Daily 08/19/20 0021 08/23/20 1113   08/19/20 0130  remdesivir 200 mg in sodium chloride 0.9% 250 mL IVPB       "Followed by" Linked Group Details   200 mg 580 mL/hr over 30 Minutes Intravenous Once 08/19/20 0021 08/19/20 0314     Subjective: Today, patient was seen and examined at bedside.  Patient denies increasing shortness of breath or dyspnea.  Lying flat in bed.  Objective: Vitals:   10/04/20 0440 10/04/20 0552  BP: 140/70 (!) 148/71  Pulse: 96   Resp: (!) 62   Temp: 98.1 F (36.7 C)   SpO2: 91%    No  intake or output data in the 24 hours ending 10/04/20 0751 Filed Weights   09/30/20 0500 10/01/20 0500 10/02/20 0500  Weight: 85.1 kg 86.3 kg 85.9 kg   Body mass index is 34.64 kg/m.   Physical Exam: GENERAL: Patient is alert awake and oriented. Not in obvious distress.  Obese, on high flow nasal cannula oxygen, appears tachypneic HENT: No scleral pallor or icterus. Pupils equally reactive to light. Oral mucosa is moist NECK: is supple, no gross swelling noted. CHEST: .  Diminished breath sounds bilaterally. CVS: S1 and S2 heard, no murmur. Regular rate and rhythm.  Mild tachycardia ABDOMEN: Soft, non-tender, bowel sounds are present. EXTREMITIES: No edema. CNS: Cranial nerves are intact. No focal motor deficits. SKIN: warm and dry without rashes.  Data Review: I have personally reviewed the following laboratory data and studies,  CBC: Recent Labs  Lab 09/30/20 0120  WBC 12.1*  HGB 11.8*  HCT 36.0  MCV 82.6  PLT 332   Basic Metabolic  Panel: No results for input(s): NA, K, CL, CO2, GLUCOSE, BUN, CREATININE, CALCIUM, MG, PHOS in the last 168 hours. Liver Function Tests: No results for input(s): AST, ALT, ALKPHOS, BILITOT, PROT, ALBUMIN in the last 168 hours. No results for input(s): LIPASE, AMYLASE in the last 168 hours. No results for input(s): AMMONIA in the last 168 hours. Cardiac Enzymes: No results for input(s): CKTOTAL, CKMB, CKMBINDEX, TROPONINI in the last 168 hours. BNP (last 3 results) Recent Labs    09/11/20 0155 09/12/20 0113 09/15/20 0028  BNP 86.8 80.1 80.0    ProBNP (last 3 results) No results for input(s): PROBNP in the last 8760 hours.  CBG: Recent Labs  Lab 10/03/20 0734 10/03/20 1118 10/03/20 1637 10/03/20 2156 10/04/20 0728  GLUCAP 167* 253* 240* 295* 48*   No results found for this or any previous visit (from the past 240 hour(s)).   Studies: No results found.    Flora Lipps, MD  [facility] 10/04/2020  If 7PM-7AM,  please contact night-coverage

## 2020-10-05 DIAGNOSIS — J9621 Acute and chronic respiratory failure with hypoxia: Secondary | ICD-10-CM | POA: Diagnosis not present

## 2020-10-05 DIAGNOSIS — J9601 Acute respiratory failure with hypoxia: Secondary | ICD-10-CM | POA: Diagnosis not present

## 2020-10-05 DIAGNOSIS — U071 COVID-19: Secondary | ICD-10-CM | POA: Diagnosis not present

## 2020-10-05 DIAGNOSIS — E875 Hyperkalemia: Secondary | ICD-10-CM | POA: Diagnosis not present

## 2020-10-05 LAB — MAGNESIUM: Magnesium: 1.9 mg/dL (ref 1.7–2.4)

## 2020-10-05 LAB — BASIC METABOLIC PANEL
Anion gap: 8 (ref 5–15)
BUN: 42 mg/dL — ABNORMAL HIGH (ref 8–23)
CO2: 20 mmol/L — ABNORMAL LOW (ref 22–32)
Calcium: 9.5 mg/dL (ref 8.9–10.3)
Chloride: 108 mmol/L (ref 98–111)
Creatinine, Ser: 1.57 mg/dL — ABNORMAL HIGH (ref 0.44–1.00)
GFR, Estimated: 36 mL/min — ABNORMAL LOW (ref >60)
Glucose, Bld: 264 mg/dL — ABNORMAL HIGH (ref 70–99)
Potassium: 5.1 mmol/L (ref 3.5–5.1)
Sodium: 136 mmol/L (ref 135–145)

## 2020-10-05 LAB — GLUCOSE, CAPILLARY
Glucose-Capillary: 154 mg/dL — ABNORMAL HIGH (ref 70–99)
Glucose-Capillary: 141 mg/dL — ABNORMAL HIGH (ref 70–99)
Glucose-Capillary: 226 mg/dL — ABNORMAL HIGH (ref 70–99)
Glucose-Capillary: 234 mg/dL — ABNORMAL HIGH (ref 70–99)

## 2020-10-05 LAB — CBC
HCT: 35.8 % — ABNORMAL LOW (ref 36.0–46.0)
Hemoglobin: 12 g/dL (ref 12.0–15.0)
MCH: 28 pg (ref 26.0–34.0)
MCHC: 33.5 g/dL (ref 30.0–36.0)
MCV: 83.4 fL (ref 80.0–100.0)
Platelets: 119 10*3/uL — ABNORMAL LOW (ref 150–400)
RBC: 4.29 MIL/uL (ref 3.87–5.11)
RDW: 22 % — ABNORMAL HIGH (ref 11.5–15.5)
WBC: 11.5 10*3/uL — ABNORMAL HIGH (ref 4.0–10.5)
nRBC: 0 % (ref 0.0–0.2)

## 2020-10-05 LAB — PHOSPHORUS: Phosphorus: 3.1 mg/dL (ref 2.5–4.6)

## 2020-10-05 MED ORDER — HYDROMORPHONE HCL 2 MG PO TABS
1.0000 mg | ORAL_TABLET | ORAL | Status: DC | PRN
  Administered 2020-10-05 – 2020-10-09 (×2): 1 mg via ORAL
  Filled 2020-10-05 (×2): qty 1

## 2020-10-05 NOTE — Progress Notes (Signed)
   10/05/20 1726  Assess: MEWS Score  Temp 98.5 F (36.9 C)  BP 140/61  ECG Heart Rate (!) 117  Resp (!) 28  Level of Consciousness Alert  SpO2 92 %  Assess: MEWS Score  MEWS Temp 0  MEWS Systolic 0  MEWS Pulse 2  MEWS RR 2  MEWS LOC 0  MEWS Score 4  MEWS Score Color Red  Assess: if the MEWS score is Yellow or Red  Were vital signs taken at a resting state? No  Focused Assessment No change from prior assessment  Early Detection of Sepsis Score *See Row Information* Low  MEWS guidelines implemented *See Row Information* No, other (Comment)  Treat  MEWS Interventions Other (Comment)  Pain Scale 0-10  Pain Score 2  Faces Pain Scale 2  Pain Type Acute pain  Pain Location Generalized  Pain Orientation Mid;Medial  Take Vital Signs  Increase Vital Sign Frequency  Red: Q 1hr X 4 then Q 4hr X 4, if remains red, continue Q 4hrs  Escalate  MEWS: Escalate Red: discuss with charge nurse/RN and provider, consider discussing with RRT  Notify: Provider  Provider Name/Title [place]  Date Provider Notified 10/05/20  Time Provider Notified 1726  Notification Type Page  Provider response No new orders  Document  Progress note created (see row info) Yes

## 2020-10-05 NOTE — Progress Notes (Signed)
PROGRESS NOTE  [name] [name] ZWC:[id number] DOB: [birth year] DOA: 08/18/2020 PCP: Glendale Chard, MD   LOS: 41 days   Brief narrative:  Patient is a 65 years old female with past medical history of renal transplant x2 on chronic immunosuppressants, diabetes mellitus type 2, hypothyroidism who presented to the hospital initially with shortness of breath.  He was then admitted for acute hypoxic respiratory failure secondary to Covid pneumonia.  Her hospital course was prolonged and slow due to severe hypoxia, acute kidney injury and non-ST elevation MI.  Patient has been requiring high flow oxygen.  At this time, patient is awaiting for placement at a skilled nursing facility.  Assessment/Plan:  Principal Problem:   ARF (acute renal failure) (HCC) Active Problems:   AKI (acute kidney injury) ([place])   Fever, unspecified   Renal transplant recipient   Immunocompromised ([place])   DM (diabetes mellitus), type 2 with renal complications ([place])   Essential hypertension with goal blood pressure less than 140/90   Acute hypoxemic respiratory failure due to COVID-19 ([place])   Respiratory failure (HCC)   Acute hyperkalemia   Diabetic ketoacidosis without coma associated with type 1 diabetes mellitus ([place])   Pressure injury of skin   Acute and chronic respiratory failure with hypoxia (HCC)   HCAP (healthcare-associated pneumonia)   Pulmonary fibrosis, postinflammatory (HCC)   Transaminitis   Controlled type 2 diabetes mellitus with hyperglycemia, with long-term current use of insulin ([place])   Sepsis secondary to pneumonia. Patient did have multiple pneumonia from Covid infection and secondary bacterial infection and ask completed IV antibiotics.  Continue supplemental oxygen.   Acute on chronic hypoxic respiratory failure secondary to Covid pneumonia and hospital-acquired pneumonia.  Patient has completed treatment with IV steroids, baricitinib, remdesivir.  Patient completed IV antibiotics  for superimposed infection.  CT scan of the chest shows progressive pulmonary fibrosis.  Currently on high flow oxygen.  On prednisone p.o. twice daily.  Awaiting improvement in FiO2 prior to discharge.  Unlikely that her pulmonary function would improve due to progressive fibrosis.  Still on very high flow oxygen requirement with increased work of breathing.  Diabetes mellitus on insulin with hyperglycemia, diabetic ketoacidosis. Initially, patient required insulin drip.  Currently on Levemir, resistant scale sliding scale insulin.  Hemoglobin A1c of 8.2 from 08/19/2020.  Currently on prednisone, we will continue to monitor closely.  Latest POC glucose of 154    AKI on CKD stage IV/transplant kidney:  Nephrology was initially consulted.  Currently nephrology has signed off.  Latest creatinine was around 1.5.  Continue prednisone, Prograf and Myfortic.     Chronic normocytic anemia secondary to anemia of CKD.:  She received 1 unit of packed RBC on 09/11/2020.  Latest hemoglobin was 12.0 on 2/27     Deconditioning/debility:  PT/OT recommended skilled nursing facility on discharge but current O2 requirements are too high to facilitate discharge to skilled nursing facility. LTAC reviewed chart but FiO2 must be </= 60%  DVT prophylaxis: enoxaparin (LOVENOX) injection 30 mg Start: 09/20/20 1400 SCDs Start: 08/19/20 0933    Code Status:  DNR  Family Communication: None  Status is: Inpatient  Remains inpatient appropriate because:IV treatments appropriate due to intensity of illness or inability to take PO, Inpatient level of care appropriate due to severity of illness and Pending DC planning, unsafe DC plan  Dispo:  Patient From:  Home  Planned Disposition:  Skilled nursing facility  Expected discharge uncertain at this time  Medically stable for discharge:  No  Barriers  to discharge yes.  On high levels of oxygen, deconditioning requiring skilled nursing facility.  Difficulty place: yes      Consultants:  None  Procedures:  2 D Echocardiogram  Anti-infectives:  . None  Subjective: Today, patient was seen and examined at bedside.  Patient denies any dyspnea but appears to be short of breath.  Denies any chest pain, dizziness, lightheadedness, sputum production   Objective: Vitals:   10/05/20 0334 10/05/20 0612  BP: 132/66 140/65  Pulse: 94   Resp:    Temp: 97.6 F (36.4 C)   SpO2: 100%    No intake or output data in the 24 hours ending 10/05/20 0735 Filed Weights   09/30/20 0500 10/01/20 0500 10/02/20 0500  Weight: 85.1 kg 86.3 kg 85.9 kg   Body mass index is 34.64 kg/m.   Physical Exam:  GENERAL: Patient is alert awake and oriented.  tachypneic, on high flow nasal cannula.  Obese HENT: No scleral pallor or icterus. Pupils equally reactive to light. Oral mucosa is moist NECK: is supple, no gross swelling noted. CHEST: .  Diminished breath sounds bilaterally.  Coarse breath sounds noted bilaterally CVS: S1 and S2 heard, no murmur. Regular rate and rhythm.  Mild tachycardia ABDOMEN: Soft, non-tender, bowel sounds are present. EXTREMITIES: No edema. CNS: Cranial nerves are intact. No focal motor deficits. SKIN: warm and dry without rashes.  Data Review: I have personally reviewed the following laboratory data and studies,  CBC: Recent Labs  Lab 09/30/20 0120 10/05/20 0054  WBC 12.1* 11.5*  HGB 11.8* 12.0  HCT 36.0 35.8*  MCV 82.6 83.4  PLT 190 176*   Basic Metabolic Panel: Recent Labs  Lab 10/05/20 0054  NA 136  K 5.1  CL 108  CO2 20*  GLUCOSE 264*  BUN 42*  CREATININE 1.57*  CALCIUM 9.5  MG 1.9  PHOS 3.1   Liver Function Tests: No results for input(s): AST, ALT, ALKPHOS, BILITOT, PROT, ALBUMIN in the last 168 hours. No results for input(s): LIPASE, AMYLASE in the last 168 hours. No results for input(s): AMMONIA in the last 168 hours. Cardiac Enzymes: No results for input(s): CKTOTAL, CKMB, CKMBINDEX, TROPONINI in the last  168 hours. BNP (last 3 results) Recent Labs    09/11/20 0155 09/12/20 0113 09/15/20 0028  BNP 86.8 80.1 80.0    ProBNP (last 3 results) No results for input(s): PROBNP in the last 8760 hours.  CBG: Recent Labs  Lab 10/04/20 0754 10/04/20 1144 10/04/20 1617 10/04/20 2105 10/05/20 0715  GLUCAP 77 113* 226* 232* 154*   No results found for this or any previous visit (from the past 240 hour(s)).   Studies: No results found.    Flora Lipps, MD  [facility] 10/05/2020  If 7PM-7AM, please contact night-coverage

## 2020-10-05 NOTE — Plan of Care (Signed)

## 2020-10-06 DIAGNOSIS — E1121 Type 2 diabetes mellitus with diabetic nephropathy: Secondary | ICD-10-CM | POA: Diagnosis not present

## 2020-10-06 DIAGNOSIS — U071 COVID-19: Secondary | ICD-10-CM | POA: Diagnosis not present

## 2020-10-06 DIAGNOSIS — J9621 Acute and chronic respiratory failure with hypoxia: Secondary | ICD-10-CM | POA: Diagnosis not present

## 2020-10-06 DIAGNOSIS — D849 Immunodeficiency, unspecified: Secondary | ICD-10-CM | POA: Diagnosis not present

## 2020-10-06 LAB — GLUCOSE, CAPILLARY
Glucose-Capillary: 188 mg/dL — ABNORMAL HIGH (ref 70–99)
Glucose-Capillary: 283 mg/dL — ABNORMAL HIGH (ref 70–99)
Glucose-Capillary: 225 mg/dL — ABNORMAL HIGH (ref 70–99)
Glucose-Capillary: 161 mg/dL — ABNORMAL HIGH (ref 70–99)

## 2020-10-06 NOTE — Progress Notes (Signed)
PT Cancellation Note  Patient Details Name: [name] [name] MRN: [id number] DOB: 05-Feb-1956   Cancelled Treatment:    Reason Eval/Treat Not Completed: Patient declined, no reason specified (attempted x 3 to see pt this am with pt unable due to toileting, eating then bathing.)   Maija B Prater 10/06/2020, 10:00 AM  [place] Pager: [phone] Office: [phone]

## 2020-10-06 NOTE — Progress Notes (Signed)
[facility] PROGRESS NOTE  [name] [name] LOV:[id number] DOB: [birth year] DOA: 08/18/2020 PCP: Glendale Chard, MD  Status: Remains inpatient appropriate because:Unsafe d/c plan, IV treatments appropriate due to intensity of illness or inability to take PO and Inpatient level of care appropriate due to severity of illness   Dispo:  Patient From:  HOME  Planned Disposition:  LTAC  Expected discharge date: 10/10/2020 Medically stable for discharge:  NO-LTAC  Difficult to place: Yes; Barriers to DC: Continues to require very high levels of oxygen secondary to Covid lung injury.  She is physically deconditioned and requires SNF for rehabilitative therapies but given high level of oxygen requirements not a candidate for SNF at this time. 2/28 plan is to dc to LTAC once insurance auth obtained   Level of care: Progressive  Code Status: DNR  Family Communication: Patient; daughter [name] [name] 2/21.   DVT prophylaxis: Lovenox Vaccination status: Fully vaccinated against Covid prior to admission.  Initial vaccine November 2021 with follow-up vaccines March and April 2021  Foley catheter: No  HPI: 6 old female with history of renal transplant x2 on chronic immunosuppression agents, diabetes, hypothyroidism who presented with shortness of breath initially.  She was found to be in acute hypoxic respiratory failure due to Covid pneumonia.  Hospital course was prolonged and was complicated by slow improvement from severe hypoxia, AKI and non-STEMI.  Continues to require high flow oxygen.  Patient was a DNR until 2/10 at which point she changed back to full code. Further discussions held w/ pt and dtr and code status changed back to DNR on 2/12   Subjective: Awake and alert.  States his breathing well but does become slightly tachypneic with speaking.  Complaining of palpitations.  Plan was to initiate beta-blocker or alpha-blocker patient did have a significant sinus pause previously on  beta-blockers so we will hold on this plan for now.  Objective: Vitals:   10/05/20 2303 10/06/20 0214  BP: 133/60   Pulse: 100 (!) 103  Resp: (!) 24 (!) 32  Temp: 98 F (36.7 C)   SpO2: 97% 94%   No intake or output data in the 24 hours ending 10/06/20 0730 Filed Weights   09/30/20 0500 10/01/20 0500 10/02/20 0500  Weight: 85.1 kg 86.3 kg 85.9 kg    Exam:  General: Awake alert and in no distress Respiratory:  35 L/min HHFNC 60% FiO2 with O2 sats is 92 to 96%-increased work of breathing noted after speaking and of course typically has increased work of breathing with desaturations with activity but after several minutes will recover. Cardiovascular: Sinus tachycardia, extremities warm without edema, normal heart sound Abdomen: Nontender nondistended.  Variable oral intake.  LBM 2/26 Neurologic: CN 2-12 grossly intact. Sensation intact, DTR normal. Strength 3-4/5 x all 4 extremities.  Psychiatric: Alert and oriented x3.  Pleasant affect   Assessment/Plan: Acute problems: Sepsis /acute on chronic hypoxic respiratory failure due to Covid pneumonia/new hospital associated pneumonia as of 2/14:  Fully vaccinated patient.  Immunocompromised status due to renal transplant, immunosuppressive medications.   Presented with multifocal pneumonia from Covid.  Completed treatment with IV steroids, baricitinib, remdesivir.  Also was treated with IV antibiotics for superimposed bacterial pneumonia.   Recent CT of the chest demonstrates progressive pulmonary fibrosis secondary to COVID.   FiO2 had been weaned down to 60% patient thus far tolerating and has been [facility].  This is the safer option in regards to higher oxygen needs in regards to transport  since [facility]. Unfortunately I suspect patient's post Covid pulmonary fibrosis is worsening/declaring itself and it appears highly unlikely that her pulmonary function will  improve.  I anticipate we may need to involve palliative care to discuss goals of care with patient and family Continue pulmonary toileting, incentive spirometer/flutter valve.   Continue scheduled Ativan every 12 hours and IV Dilaudid 0.5 mg every 4 hours prn for air hunger and associated anxiety Completed 7 days of cefepime  Continue prednisone to 50 mg BID and taper slowly.  Seems more comfortable with transition from Solu-Medrol to prednisone so may hold on taper to see if FiO2 will decrease first before beginning steroid taper.  DKA/diabetes mellitus on insulin with hyperglycemia DKA resolved.  No longer on insulin drip.   Continue Levemir to 25 units given- cont resistant SSI  Continue Novolog MC 2 units  CBGs have improved with adjustments in long-acting insulin and are now running between 141 and 234 Hemoglobin A1c of 8.2 as per 1/11.   2/23 CBG at 8 AM had decreased to 45 2/2 missed doses of IV Solumedrol-have changed to Prednisone  AKI on CKD stage IV/transplant kidney:  Transplant x2.  AKI was most likely hemodynamically mediated.   Creatinine has remained acutely around 1.7.   Nephrology has signed off  Continue prednisone, Prograf and Myfortic  Avoid nephrotoxic medications  Chronic normocytic anemia:  Most likely associated  with CKD.  No signs of bleeding.   Status post 1 unit of PRBC transfusion on 09/11/2020.   Hemoglobin 11.8 on 2/21  Deconditioning/debility:  PT/OT recommended skilled nursing facility on discharge but current O2 requirements are too high to facilitate discharge to skilled nursing facility. LTAC reviewed chart and [facility]  Stage I decubitus coccyx Pressure Injury 08/20/20 Coccyx Mid Stage 1 -  Intact skin with non-blanchable redness of a localized area usually over a bony prominence. (Active)  Date First Assessed/Time First Assessed: 08/20/20 0800   Location: Coccyx  Location Orientation: Mid  Staging: Stage 1 -  Intact skin with  non-blanchable redness of a localized area usually over a bony prominence.  Present on Admission: Yes    Assessments 08/20/2020  9:00 AM 10/03/2020  8:01 PM  Dressing Type Foam - Lift dressing to assess site every shift Foam - Lift dressing to assess site every shift  Dressing Changed Clean;Dry;Intact  Dressing Change Frequency Every 3 days PRN  State of Healing -- Early/partial granulation  Site / Wound Assessment Clean;Dry;Pink Clean;Dry  Wound Length (cm) 5 cm --  Wound Width (cm) 0.1 cm --  Wound Depth (cm) 0 cm --  Wound Surface Area (cm^2) 0.5 cm^2 --  Wound Volume (cm^3) 0 cm^3 --  Drainage Amount None None  Drainage Description -- No odor  Treatment Cleansed;Off loading --     No Linked orders to display     Wound / Incision (Open or Dehisced) 08/31/20 Puncture Abdomen Left;Lower From Heparin SQ injections. (Active)  Date First Assessed/Time First Assessed: 08/31/20 2000   Wound Type: Puncture  Location: Abdomen  Location Orientation: Left;Lower  Wound Description (Comments): From Heparin SQ injections.  Present on Admission: No    Assessments 08/31/2020  8:00 PM 10/01/2020  8:00 PM  Dressing Status -- Clean;Dry;Intact  Wound Length (cm) 0 cm --  Wound Width (cm) 0 cm --  Wound Depth (cm) 0 cm --  Wound Volume (cm^3) 0 cm^3 --  Wound Surface Area (cm^2) 0 cm^2 --  No Linked orders to display       Other problems: Transaminitis:  Resolved likely secondary to COVID meds as well as hypoperfusion  Right upper quadrant ultrasound is nonacute and showed some sludge/gallstones but no signs of cholecystitis.   Hepatitis panel negative.    Yeast vaginitis -Likely secondary to recent antibiotics, steroids and resultant -Began intravaginal antifungals on 2/21 x 3 days  NSTEMI:  Patient with chest pain on 1/23 with EKG changes.   Treated with Nitroglycerin, morphine and beta-blocker.   Echo is reassuring with preserved ejection fraction.   Continue aspirin/statin  completed 48 hours of IV heparin.   Cardiology signed off.   Sinus pause: Happened on  1/20 with 4.5 seconds pause.   Beta-blocker discontinued.  Currently stable.  Thrombocytopenia:  Most likely associated with acute illness.  Continue to monitor,stable  Complicated UTI:  Completed course of Rocephin.  Hypothyroidism:  Continue Synthyroid.   Repeat TFT in 3 months  Hypertension:  Currently blood pressure stable.  Continue medications  Hyperlipidemia:  Statin held due to elevated LFTs.   LFTs have normalized so can consider resumption of statin although oral intake remains variable  Obesity:  BMI 33.9  Nutrition Status: Estimated body mass index is 34.64 kg/m as calculated from the following:   Height as of this encounter: 5\' 2"  (1.575 m).   Weight as of this encounter: 85.9 kg.  Poor oral intake 2/2 air hunger          Data Reviewed: Basic Metabolic Panel: Recent Labs  Lab 10/05/20 0054  NA 136  K 5.1  CL 108  CO2 20*  GLUCOSE 264*  BUN 42*  CREATININE 1.57*  CALCIUM 9.5  MG 1.9  PHOS 3.1   Liver Function Tests: No results for input(s): AST, ALT, ALKPHOS, BILITOT, PROT, ALBUMIN in the last 168 hours. No results for input(s): LIPASE, AMYLASE in the last 168 hours. No results for input(s): AMMONIA in the last 168 hours. CBC: Recent Labs  Lab 09/30/20 0120 10/05/20 0054  WBC 12.1* 11.5*  HGB 11.8* 12.0  HCT 36.0 35.8*  MCV 82.6 83.4  PLT 190 119*   Cardiac Enzymes: No results for input(s): CKTOTAL, CKMB, CKMBINDEX, TROPONINI in the last 168 hours. BNP (last 3 results) Recent Labs    09/11/20 0155 09/12/20 0113 09/15/20 0028  BNP 86.8 80.1 80.0    ProBNP (last 3 results) No results for input(s): PROBNP in the last 8760 hours.  CBG: Recent Labs  Lab 10/04/20 2105 10/05/20 0715 10/05/20 1218 10/05/20 1558 10/05/20 2024  GLUCAP 232* 154* 141* 226* 234*    No results found for this or any previous visit (from the past 240  hour(s)).   Studies: No results found.  Scheduled Meds: . amLODipine  10 mg Oral Daily  . vitamin C  500 mg Oral Daily  . aspirin EC  81 mg Oral Daily  . enoxaparin (LOVENOX) injection  30 mg Subcutaneous Q24H  . feeding supplement (NEPRO CARB STEADY)  237 mL Oral BID BM  . fluticasone  1 spray Each Nare Daily  . folic acid  1 mg Oral Daily  . hydrALAZINE  50 mg Oral Q8H  . insulin aspart  0-20 Units Subcutaneous TID WC  . insulin aspart  2 Units Subcutaneous TID WC  . insulin detemir  25 Units Subcutaneous BID  . isosorbide mononitrate  60 mg Oral Daily  . levothyroxine  50 mcg Oral Q0600  . LORazepam  1 mg Oral BID  .  mouth rinse  15 mL Mouth Rinse BID  . mycophenolate  360 mg Oral BID  . pantoprazole  40 mg Oral BID  . predniSONE  50 mg Oral BID WC  . tacrolimus  3 mg Oral QPM  . tacrolimus  4 mg Oral Daily  . zinc sulfate  220 mg Oral Daily   Continuous Infusions: . sodium chloride    . sodium chloride Stopped (08/31/20 1545)    Principal Problem:   ARF (acute renal failure) (HCC) Active Problems:   AKI (acute kidney injury) ([place])   Fever, unspecified   Renal transplant recipient   Immunocompromised ([place])   DM (diabetes mellitus), type 2 with renal complications ([place])   Essential hypertension with goal blood pressure less than 140/90   Acute hypoxemic respiratory failure due to COVID-19 ([place])   Respiratory failure (HCC)   Acute hyperkalemia   Diabetic ketoacidosis without coma associated with type 1 diabetes mellitus ([place])   Pressure injury of skin   Acute and chronic respiratory failure with hypoxia ([place])   HCAP (healthcare-associated pneumonia)   Pulmonary fibrosis, postinflammatory (HCC)   Transaminitis   Controlled type 2 diabetes mellitus with hyperglycemia, with long-term current use of insulin ([place])   Consultants:  None  Procedures:  Echocardiogram  Antibiotics: Anti-infectives (From admission, onward)   Start     Dose/Rate Route Frequency  Ordered Stop   09/24/20 1200  vancomycin (VANCOREADY) IVPB 1000 mg/200 mL  Status:  Discontinued        1,000 mg 200 mL/hr over 60 Minutes Intravenous Every 48 hours 09/22/20 1036 09/24/20 1013   09/22/20 1130  vancomycin (VANCOREADY) IVPB 1750 mg/350 mL        1,750 mg 175 mL/hr over 120 Minutes Intravenous  Once 09/22/20 1036 09/22/20 1735   09/22/20 1130  ceFEPIme (MAXIPIME) 2 g in sodium chloride 0.9 % 100 mL IVPB        2 g 200 mL/hr over 30 Minutes Intravenous Every 24 hours 09/22/20 1036 09/28/20 0052   09/07/20 1030  vancomycin (VANCOCIN) IVPB 1000 mg/200 mL premix  Status:  Discontinued        1,000 mg 200 mL/hr over 60 Minutes Intravenous Every 48 hours 09/05/20 0933 09/07/20 0951   09/05/20 1030  vancomycin (VANCOREADY) IVPB 2000 mg/400 mL        2,000 mg 200 mL/hr over 120 Minutes Intravenous  Once 09/05/20 0933 09/05/20 2018   09/05/20 1030  ceFEPIme (MAXIPIME) 2 g in sodium chloride 0.9 % 100 mL IVPB  Status:  Discontinued        2 g 200 mL/hr over 30 Minutes Intravenous Every 24 hours 09/05/20 0933 09/07/20 0951   08/20/20 1445  cefTRIAXone (ROCEPHIN) 1 g in sodium chloride 0.9 % 100 mL IVPB        1 g 200 mL/hr over 30 Minutes Intravenous Every 24 hours 08/20/20 1348 08/26/20 2147   08/20/20 1000  remdesivir 100 mg in sodium chloride 0.9 % 100 mL IVPB       "Followed by" Linked Group Details   100 mg 200 mL/hr over 30 Minutes Intravenous Daily 08/19/20 0021 08/23/20 1113   08/19/20 0130  remdesivir 200 mg in sodium chloride 0.9% 250 mL IVPB       "Followed by" Linked Group Details   200 mg 580 mL/hr over 30 Minutes Intravenous Once 08/19/20 0021 08/19/20 0314       Time spent: 20 minutes    Erin Hearing ANP  [facility] 7 am -  330 pm/M-F for direct patient care and secure chat Please refer to Amion for contact info 48  days

## 2020-10-06 NOTE — Progress Notes (Signed)
Inpatient Diabetes Program Recommendations  AACE/ADA: New Consensus Statement on Inpatient Glycemic Control  Target Ranges:  Prepandial:   less than 140 mg/dL      Peak postprandial:   less than 180 mg/dL (1-2 hours)      Critically ill patients:  140 - 180 mg/dL     Review of Glycemic Control Results for [name] [name], [name] [name] (MRN [id number]) as of 10/06/2020 09:13  Ref. Range 10/05/2020 07:15 10/05/2020 12:18 10/05/2020 15:58 10/05/2020 20:24 10/06/2020 07:32  Glucose-Capillary Latest Ref Range: 70 - 99 mg/dL 154 (H) 141 (H) 226 (H) 234 (H) 188 (H)   Current orders for Inpatient glycemic control: Levemir 25 units BID, Novolog 0-20 units TID with meals, Novolog 2 units TID with meals; PO prednisone 50 mg bid  Nepro Bid between meals  Inpatient Diabetes Program Recommendations:    If steroids are continued, please consider: -  Increasing Levemir to 28 units BID -  Increasing meal coverage to Novolog 4 units TID with meals if patient eats at least 50% of meals.  Thanks, Tama Headings RN, MSN, BC-ADM Inpatient Diabetes Coordinator Team Pager [phone] (8a-5p)

## 2020-10-06 NOTE — TOC Progression Note (Signed)
Transition of Care [facility]) - Progression Note    Patient Details  Name: Angila Wombles MRN: [id number] Date of Birth: 05-06-1956  [facility]) CM/SW [place], RN Phone Number: 10/06/2020, 10:11 AM  Clinical Narrative:    Case management met with the patient at the bedside with Arley Phenix, CM from [facility] and the facility is able to offer the patient an admission bed at the facility.  The patient is currently on 60% HFNC and is in agreement for admission to LTAC at the facility.  I called and spoke with Lynelle Smoke, RNCM at [facility] and she will begin insurance authorization for the patient at this time.  CM will continue to follow the patient for LTAC admission with pending insurance authorization.   Expected Discharge Plan: Long Term Acute Care (LTAC) Barriers to Discharge: Continued Medical Work up  Expected Discharge Plan and Services Expected Discharge Plan: [place] (LTAC) In-house Referral: Clinical Social Work Discharge Planning Services: CM Consult Post Acute Care Choice: [place] arrangements for the past 2 months: Single Family Home                                       Social Determinants of Health (SDOH) Interventions    Readmission Risk Interventions Readmission Risk Prevention Plan 09/19/2020  Transportation Screening Complete  Medication Review [employer]) Complete  PCP or Specialist appointment within 3-5 days of discharge Complete  HRI or Home Care Consult Complete  SW Recovery Care/Counseling Consult Complete  Palliative Care Screening Complete  Skilled Nursing Facility Complete  Some recent data might be hidden

## 2020-10-07 DIAGNOSIS — U071 COVID-19: Secondary | ICD-10-CM | POA: Diagnosis not present

## 2020-10-07 DIAGNOSIS — J9621 Acute and chronic respiratory failure with hypoxia: Secondary | ICD-10-CM | POA: Diagnosis not present

## 2020-10-07 DIAGNOSIS — D849 Immunodeficiency, unspecified: Secondary | ICD-10-CM | POA: Diagnosis not present

## 2020-10-07 DIAGNOSIS — E1121 Type 2 diabetes mellitus with diabetic nephropathy: Secondary | ICD-10-CM | POA: Diagnosis not present

## 2020-10-07 LAB — GLUCOSE, CAPILLARY
Glucose-Capillary: 90 mg/dL (ref 70–99)
Glucose-Capillary: 152 mg/dL — ABNORMAL HIGH (ref 70–99)
Glucose-Capillary: 217 mg/dL — ABNORMAL HIGH (ref 70–99)
Glucose-Capillary: 135 mg/dL — ABNORMAL HIGH (ref 70–99)

## 2020-10-07 MED ORDER — MIDAZOLAM HCL (PF) 10 MG/2ML IJ SOLN: 0.5000 mg | Freq: Four times a day (QID) | INTRAMUSCULAR | Status: DC | PRN

## 2020-10-07 MED ORDER — DIAZEPAM 2 MG PO TABS: 2.0000 mg | ORAL_TABLET | Freq: Four times a day (QID) | ORAL | Status: DC | PRN

## 2020-10-07 MED ORDER — IPRATROPIUM-ALBUTEROL 0.5-2.5 (3) MG/3ML IN SOLN
3.0000 mL | Freq: Three times a day (TID) | RESPIRATORY_TRACT | Status: DC
  Administered 2020-10-07 – 2020-10-13 (×17): 3 mL via RESPIRATORY_TRACT
  Filled 2020-10-07 (×18): qty 3

## 2020-10-07 NOTE — TOC Progression Note (Addendum)
Transition of Care [facility]) - Progression Note    Patient Details  Name: Voula Waln MRN: [id number] Date of Birth: April 16, 1956  Transition of Care [facility]) CM/SW Contact  Curlene Labrum, RN Phone Number: 10/07/2020, 8:30 AM  Clinical Narrative:    Case management spoke with Lynelle Smoke, [facility] with [employer] and the patient has been approved for admission to [place] for admission - Auth [id number] for  5 days.  Arley Phenix, CM at [facility] was notified of approval - no beds available today for admission but pending transfer once available to the facility.  Arley Phenix, CM with [facility] states that the facility should have an available admission bed this week for the patient.  I spoke to the patient at the bedside regarding her insurance authorization approval and that she is now waiting on bed availability for her transfer to the facility as this time.  The patient remains on 50-60% HFNC at this time and is hopeful for her transfer to [facility] in the next couple of days.  I plan to update the patient's daughter as well.  CM will continue to follow for admission to [facility] when bed is available.   Expected Discharge Plan: Long Term Acute Care (LTAC) Barriers to Discharge: Continued Medical Work up  Expected Discharge Plan and Services Expected Discharge Plan: [place] (LTAC) In-house Referral: Clinical Social Work Discharge Planning Services: CM Consult Post Acute Care Choice: [place] arrangements for the past 2 months: Single Family Home                                       Social Determinants of Health (SDOH) Interventions    Readmission Risk Interventions Readmission Risk Prevention Plan 09/19/2020  Transportation Screening Complete  Medication Review Press photographer) Complete  PCP or Specialist appointment within 3-5 days of discharge Complete  HRI or Home Care Consult  Complete  SW Recovery Care/Counseling Consult Complete  Palliative Care Screening Complete  Skilled Nursing Facility Complete  Some recent data might be hidden

## 2020-10-07 NOTE — Progress Notes (Signed)
Physical Therapy Treatment Patient Details Name: [name] [name] MRN: [id number] DOB: Mar 28, 1956 Today's Date: 10/07/2020    History of Present Illness Pt is a 65 y.o. female who tested (+) COVID-19 on 08/13/20, now admitted 08/18/20 with worsening SOB, cough, chest pain and body aches. Acute hypoxic respiratory failure due to COVID-19 PNA, DKA, AKI on CKD, UTI. CP on 1/22 with NSTEMI . PMHx: HTN, DM2, chronic steroid use, s/p renal transplant x2.    PT Comments    Pt pleasant and wanting to toilet on arrival. Pt with SpO2 100% on 50%/35L on arrival with drop to 74% with transfer and bump to 70% FIO2 for 1 min for recovery to 89% then titration to 60% FiO2. Pt with significant decline in lower body strength and function requiring increased assist for all transfers and max assist to pivot with lack of control of RLE.  Pt able to get to [facility] and back today with need for 70% during transfers but able to return to 50% within 2 min of seated rest after activity with RR 45-50 during transfers and 30 at rest. Will continue to follow and pt happy to be able to get OOB today.    Follow Up Recommendations  Supervision/Assistance - 24 hour;LTACH     Equipment Recommendations  3in1 (PT);Hospital bed;Other (comment);Wheelchair (measurements PT)    Recommendations for Other Services       Precautions / Restrictions Precautions Precautions: Fall Precaution Comments: watch SpO2 quick to desaturate with HHFNC    Mobility  Bed Mobility Overal bed mobility: Needs Assistance Bed Mobility: Supine to Sit;Rolling Rolling: Supervision   Supine to sit: Mod assist Sit to supine: Mod assist   General bed mobility comments: HOB 45 degrees with mod assist to move RLE and pelvis to fully pivot to EOB. Mod assist to lift legs to bed to return to supine. supervision with cues for rolling bil for pericare    Transfers Overall transfer level: Needs assistance   Transfers: Sit to/from Stand;Stand Pivot  Transfers Sit to Stand: Mod assist Stand pivot transfers: Max assist       General transfer comment: mod assist with LUE hooked and LLE blocked. With pivot pt needing max cues to release rail on right hand and RLE not moving and actually sliding away from transfer with max assist to pivot to [facility]. Transfer from [facility] to bed with Mod +2 assist with right knee blocked.  Ambulation/Gait             General Gait Details: unable   Stairs             Wheelchair Mobility    Modified Rankin (Stroke Patients Only)       Balance Overall balance assessment: Needs assistance   Sitting balance-Leahy Scale: Poor Sitting balance - Comments: EOB with UE support and guarding   Standing balance support: Single extremity supported Standing balance-Leahy Scale: Poor Standing balance comment: UE support and pelvis support                            Cognition Arousal/Alertness: Awake/alert Behavior During Therapy: Flat affect Overall Cognitive Status: Impaired/Different from baseline Area of Impairment: Safety/judgement;Awareness                   Current Attention Level: [place]   Following Commands: Follows one step commands consistently Safety/Judgement: Decreased awareness of deficits;Decreased awareness of safety Awareness: Emergent Problem Solving: Slow processing General Comments: pt with decreased  functional mobiity with lack of awareness of decline      Exercises      General Comments        Pertinent Vitals/Pain Pain Assessment: No/denies pain    Home Living                      Prior Function            PT Goals (current goals can now be found in the care plan section) Acute Rehab PT Goals Time For Goal Achievement: 10/21/20 Potential to Achieve Goals: Fair Progress towards PT goals: Not progressing toward goals - comment;Goals downgraded-see care plan    Frequency    Min 2X/week      PT Plan Current plan  remains appropriate    Co-evaluation              AM-PAC PT "6 Clicks" Mobility   Outcome Measure  Help needed turning from your back to your side while in a flat bed without using bedrails?: A Little Help needed moving from lying on your back to sitting on the side of a flat bed without using bedrails?: A Lot Help needed moving to and from a bed to a chair (including a wheelchair)?: A Lot Help needed standing up from a chair using your arms (e.g., wheelchair or bedside chair)?: A Lot Help needed to walk in hospital room?: Total Help needed climbing 3-5 steps with a railing? : Total 6 Click Score: 11    End of Session Equipment Utilized During Treatment: Oxygen;Gait belt Activity Tolerance: Patient limited by fatigue Patient left: in bed;with call bell/phone within reach;with bed alarm set Nurse Communication: Mobility status;Precautions PT Visit Diagnosis: Other abnormalities of gait and mobility (R26.89);Muscle weakness (generalized) (M62.81)     Time: 6381-7711 PT Time Calculation (min) (ACUTE ONLY): 38 min  Charges:  $Therapeutic Activity: 38-52 mins                     Kaziah Krizek P, PT Acute Rehabilitation Services Pager: [phone] Office: [place] 10/07/2020, 9:54 AM

## 2020-10-07 NOTE — Progress Notes (Signed)
Can not outreach to patient due to being hospitalized. Reviewed chart Patient is still currently hospitalized since 08/18/2020.  [place] Management  Direct Dial [phone].

## 2020-10-07 NOTE — Progress Notes (Signed)
[facility] PROGRESS NOTE  [name] [name] LGX:[id number] DOB: 07-27-1956 DOA: 08/18/2020 PCP: Glendale Chard, MD  Status: Remains inpatient appropriate because:Unsafe d/c plan, IV treatments appropriate due to intensity of illness or inability to take PO and Inpatient level of care appropriate due to severity of illness   Dispo:  Patient From:  HOME  Planned Disposition:  [facility]/1 insurance authorization obtained.  No beds available noting to people in line ahead of patient.  Expected discharge date: 10/10/2020 Medically stable for discharge:  NO-LTAC  Difficult to place: Yes; Barriers to DC: Continues to require very high levels of oxygen secondary to Covid lung injury.  She is physically deconditioned and requires SNF for rehabilitative therapies but given high level of oxygen requirements not a candidate for SNF at this time. 2/28 plan is to dc to LTAC once insurance auth obtained   Level of care: Progressive  Code Status: DNR  Family Communication: Patient; daughter [name] [name] 2/21.   DVT prophylaxis: Lovenox Vaccination status: Fully vaccinated against Covid prior to admission.  Initial vaccine November 2021 with follow-up vaccines March and April 2021  Foley catheter: No  HPI: 3 old female with history of renal transplant x2 on chronic immunosuppression agents, diabetes, hypothyroidism who presented with shortness of breath initially.  She was found to be in acute hypoxic respiratory failure due to Covid pneumonia.  Hospital course was prolonged and was complicated by slow improvement from severe hypoxia, AKI and non-STEMI.  Continues to require high flow oxygen.  Patient was a DNR until 2/10 at which point she changed back to full code. Further discussions held w/ pt and dtr and code status changed back to DNR on 2/12   Subjective: Alert.  Very excited that she has been accepted to [facility].  Objective: Vitals:   10/07/20 0607 10/07/20 0712  BP: (!) 145/78    Pulse:    Resp:    Temp:    SpO2:  93%   No intake or output data in the 24 hours ending 10/07/20 0735 Filed Weights   10/01/20 0500 10/02/20 0500 10/07/20 0500  Weight: 86.3 kg 85.9 kg 84.6 kg    Exam:  General: Alert, calm, no distress Respiratory: Clear to auscultation.  Some increased work of breathing noting she had just finished therapy session.  O2 sats decreased into the 70s with activity with transient increase in FiO2 to 70% to aid in recovery.  35 L/min HHFNC 50% FiO2 with O2 sats is 92 to 96% Cardiovascular: Sinus tachycardia, regular pulse, no peripheral edema Abdomen: Soft nontender nondistended with active bowel sounds.  Variable oral intake LBM 2/28 Neurologic: CN 2-12 grossly intact. Sensation intact, DTR normal. Strength 3-4/5 x all 4 extremities.  Psychiatric: And oriented x3, no acute distress.   Assessment/Plan: Acute problems: Sepsis /acute on chronic hypoxic respiratory failure due to Covid pneumonia/new hospital associated pneumonia as of 2/14:  Fully vaccinated patient.  Immunocompromised status due to renal transplant, immunosuppressive medications.   Presented with multifocal pneumonia from Covid.  Completed treatment with IV steroids, baricitinib, remdesivir.  Also was treated with IV antibiotics for superimposed bacterial pneumonia.   Recent CT of the chest demonstrates progressive pulmonary fibrosis secondary to COVID.   FiO2 had been weaned down to 50% patient thus far tolerating and has been given a bed offer [facility].  This is the safer option in regards to higher oxygen needs in regards to transport since [facility] is located within [place] pulmonary toileting, incentive spirometer/flutter valve.  Continue scheduled Ativan every 12 hours and IV Dilaudid 0.5 mg every 4 hours prn for air hunger and associated anxiety Completed 7 days of cefepime  Continue prednisone to 50 mg BID-no taper ust yet since able to  slowly wean Prednisone on higher dose-home dose 5 mg daily  DKA/diabetes mellitus on insulin with hyperglycemia DKA resolved.  No longer on insulin drip.   Continue Levemir to 25 units given- cont resistant SSI  Continue Novolog MC 2 units  CBGs have improved with adjustments in long-acting insulin and are now running between 90 and 283 Hemoglobin A1c of 8.2 as per 1/11.   2/23 CBG at 8 AM had decreased to 45 2/2 missed doses of IV Solumedrol-have changed to Prednisone  AKI on CKD stage IV/transplant kidney:  Transplant x2.  AKI was most likely hemodynamically mediated.   Creatinine has remained acutely around 1.7.   Nephrology has signed off  Continue prednisone, Prograf and Myfortic  Avoid nephrotoxic medications  Chronic normocytic anemia:  Most likely associated  with CKD.  No signs of bleeding.   Status post 1 unit of PRBC transfusion on 09/11/2020.   Hemoglobin 11.8 on 2/21  Deconditioning/debility:  PT/OT recommended skilled nursing facility on discharge but current O2 requirements are too high to facilitate discharge to skilled nursing facility. LTAC reviewed chart and now appropriate for [facility]  Stage I decubitus coccyx Pressure Injury 08/20/20 Coccyx Mid Stage 1 -  Intact skin with non-blanchable redness of a localized area usually over a bony prominence. (Active)  Date First Assessed/Time First Assessed: 08/20/20 0800   Location: Coccyx  Location Orientation: Mid  Staging: Stage 1 -  Intact skin with non-blanchable redness of a localized area usually over a bony prominence.  Present on Admission: Yes    Assessments 08/20/2020  9:00 AM 10/03/2020  8:01 PM  Dressing Type Foam - Lift dressing to assess site every shift Foam - Lift dressing to assess site every shift  Dressing Changed Clean;Dry;Intact  Dressing Change Frequency Every 3 days PRN  State of Healing -- Early/partial granulation  Site / Wound Assessment Clean;Dry;Pink Clean;Dry  Wound Length (cm) 5 cm --  Wound  Width (cm) 0.1 cm --  Wound Depth (cm) 0 cm --  Wound Surface Area (cm^2) 0.5 cm^2 --  Wound Volume (cm^3) 0 cm^3 --  Drainage Amount None None  Drainage Description -- No odor  Treatment Cleansed;Off loading --     No Linked orders to display     Wound / Incision (Open or Dehisced) 08/31/20 Puncture Abdomen Left;Lower From Heparin SQ injections. (Active)  Date First Assessed/Time First Assessed: 08/31/20 2000   Wound Type: Puncture  Location: Abdomen  Location Orientation: Left;Lower  Wound Description (Comments): From Heparin SQ injections.  Present on Admission: No    Assessments 08/31/2020  8:00 PM 10/01/2020  8:00 PM  Dressing Status -- Clean;Dry;Intact  Wound Length (cm) 0 cm --  Wound Width (cm) 0 cm --  Wound Depth (cm) 0 cm --  Wound Volume (cm^3) 0 cm^3 --  Wound Surface Area (cm^2) 0 cm^2 --     No Linked orders to display       Other problems: Transaminitis:  Resolved likely secondary to COVID meds as well as hypoperfusion  Right upper quadrant ultrasound is nonacute and showed some sludge/gallstones but no signs of cholecystitis.   Hepatitis panel negative.    Yeast vaginitis -Likely secondary to recent antibiotics, steroids and resultant -Began intravaginal antifungals on 2/21 x 3 days  NSTEMI:  Patient with chest pain on 1/23 with EKG changes.   Treated with Nitroglycerin, morphine and beta-blocker.   Echo is reassuring with preserved ejection fraction.   Continue aspirin/statin completed 48 hours of IV heparin.   Cardiology signed off.   Sinus pause: Happened on  1/20 with 4.5 seconds pause.   Beta-blocker discontinued.  Currently stable.  Thrombocytopenia:  Most likely associated with acute illness.  Continue to monitor,stable  Complicated UTI:  Completed course of Rocephin.  Hypothyroidism:  Continue Synthyroid.   Repeat TFT in 3 months  Hypertension:  Currently blood pressure stable.  Continue medications  Hyperlipidemia:  Statin held  due to elevated LFTs.   LFTs have normalized so can consider resumption of statin although oral intake remains variable  Obesity:  BMI 33.9  Nutrition Status: Estimated body mass index is 34.11 kg/m as calculated from the following:   Height as of this encounter: 5\' 2"  (1.575 m).   Weight as of this encounter: 84.6 kg.  Poor oral intake 2/2 air hunger          Data Reviewed: Basic Metabolic Panel: Recent Labs  Lab 10/05/20 0054  NA 136  K 5.1  CL 108  CO2 20*  GLUCOSE 264*  BUN 42*  CREATININE 1.57*  CALCIUM 9.5  MG 1.9  PHOS 3.1   Liver Function Tests: No results for input(s): AST, ALT, ALKPHOS, BILITOT, PROT, ALBUMIN in the last 168 hours. No results for input(s): LIPASE, AMYLASE in the last 168 hours. No results for input(s): AMMONIA in the last 168 hours. CBC: Recent Labs  Lab 10/05/20 0054  WBC 11.5*  HGB 12.0  HCT 35.8*  MCV 83.4  PLT 119*   Cardiac Enzymes: No results for input(s): CKTOTAL, CKMB, CKMBINDEX, TROPONINI in the last 168 hours. BNP (last 3 results) Recent Labs    09/11/20 0155 09/12/20 0113 09/15/20 0028  BNP 86.8 80.1 80.0    ProBNP (last 3 results) No results for input(s): PROBNP in the last 8760 hours.  CBG: Recent Labs  Lab 10/05/20 2024 10/06/20 0732 10/06/20 1140 10/06/20 1557 10/06/20 1921  GLUCAP 234* 188* 283* 225* 161*    No results found for this or any previous visit (from the past 240 hour(s)).   Studies: No results found.  Scheduled Meds: . amLODipine  10 mg Oral Daily  . vitamin C  500 mg Oral Daily  . aspirin EC  81 mg Oral Daily  . enoxaparin (LOVENOX) injection  30 mg Subcutaneous Q24H  . feeding supplement (NEPRO CARB STEADY)  237 mL Oral BID BM  . fluticasone  1 spray Each Nare Daily  . folic acid  1 mg Oral Daily  . hydrALAZINE  50 mg Oral Q8H  . insulin aspart  0-20 Units Subcutaneous TID WC  . insulin aspart  2 Units Subcutaneous TID WC  . insulin detemir  25 Units Subcutaneous BID  .  isosorbide mononitrate  60 mg Oral Daily  . levothyroxine  50 mcg Oral Q0600  . LORazepam  1 mg Oral BID  . mouth rinse  15 mL Mouth Rinse BID  . mycophenolate  360 mg Oral BID  . pantoprazole  40 mg Oral BID  . predniSONE  50 mg Oral BID WC  . tacrolimus  3 mg Oral QPM  . tacrolimus  4 mg Oral Daily  . zinc sulfate  220 mg Oral Daily   Continuous Infusions: . sodium chloride    . sodium chloride Stopped (08/31/20 1545)  Principal Problem:   ARF (acute renal failure) ([place]) Active Problems:   AKI (acute kidney injury) ([place])   Fever, unspecified   Renal transplant recipient   Immunocompromised ([place])   DM (diabetes mellitus), type 2 with renal complications ([place])   Essential hypertension with goal blood pressure less than 140/90   Acute hypoxemic respiratory failure due to COVID-19 ([place])   Respiratory failure ([facility])   Acute hyperkalemia   Diabetic ketoacidosis without coma associated with type 1 diabetes mellitus ([place])   Pressure injury of skin   Acute and chronic respiratory failure with hypoxia ([place])   HCAP (healthcare-associated pneumonia)   Pulmonary fibrosis, postinflammatory (H[facility])   Transaminitis   Controlled type 2 diabetes mellitus with hyperglycemia, with long-term current use of insulin ([place])   Consultants:  None  Procedures:  Echocardiogram  Antibiotics: Anti-infectives (From admission, onward)   Start     Dose/Rate Route Frequency Ordered Stop   09/24/20 1200  vancomycin (VANCOREADY) IVPB 1000 mg/200 mL  Status:  Discontinued        1,000 mg 200 mL/hr over 60 Minutes Intravenous Every 48 hours 09/22/20 1036 09/24/20 1013   09/22/20 1130  vancomycin (VANCOREADY) IVPB 1750 mg/350 mL        1,750 mg 175 mL/hr over 120 Minutes Intravenous  Once 09/22/20 1036 09/22/20 1735   09/22/20 1130  ceFEPIme (MAXIPIME) 2 g in sodium chloride 0.9 % 100 mL IVPB        2 g 200 mL/hr over 30 Minutes Intravenous Every 24 hours 09/22/20 1036 09/28/20 0052   09/07/20 1030   vancomycin (VANCOCIN) IVPB 1000 mg/200 mL premix  Status:  Discontinued        1,000 mg 200 mL/hr over 60 Minutes Intravenous Every 48 hours 09/05/20 0933 09/07/20 0951   09/05/20 1030  vancomycin (VANCOREADY) IVPB 2000 mg/400 mL        2,000 mg 200 mL/hr over 120 Minutes Intravenous  Once 09/05/20 0933 09/05/20 2018   09/05/20 1030  ceFEPIme (MAXIPIME) 2 g in sodium chloride 0.9 % 100 mL IVPB  Status:  Discontinued        2 g 200 mL/hr over 30 Minutes Intravenous Every 24 hours 09/05/20 0933 09/07/20 0951   08/20/20 1445  cefTRIAXone (ROCEPHIN) 1 g in sodium chloride 0.9 % 100 mL IVPB        1 g 200 mL/hr over 30 Minutes Intravenous Every 24 hours 08/20/20 1348 08/26/20 2147   08/20/20 1000  remdesivir 100 mg in sodium chloride 0.9 % 100 mL IVPB       "Followed by" Linked Group Details   100 mg 200 mL/hr over 30 Minutes Intravenous Daily 08/19/20 0021 08/23/20 1113   08/19/20 0130  remdesivir 200 mg in sodium chloride 0.9% 250 mL IVPB       "Followed by" Linked Group Details   200 mg 580 mL/hr over 30 Minutes Intravenous Once 08/19/20 0021 08/19/20 0314       Time spent: 20 minutes    Erin Hearing ANP  [facility] 7 am - 330 pm/M-F for direct patient care and secure chat Please refer to Amion for contact info 49  days

## 2020-10-07 NOTE — Plan of Care (Signed)
  Problem: Education: Goal: Knowledge of General Education information will improve Description: Including pain rating scale, medication(s)/side effects and non-pharmacologic comfort measures 10/07/2020 2230 by Berta Minor, RN Outcome: Progressing 10/07/2020 2230 by Berta Minor, RN Outcome: Progressing   Problem: Health Behavior/Discharge Planning: Goal: Ability to manage health-related needs will improve 10/07/2020 2230 by Berta Minor, RN Outcome: Progressing 10/07/2020 2230 by Berta Minor, RN Outcome: Progressing   Problem: Clinical Measurements: Goal: Ability to maintain clinical measurements within normal limits will improve 10/07/2020 2230 by Berta Minor, RN Outcome: Progressing 10/07/2020 2230 by Berta Minor, RN Outcome: Progressing Goal: Will remain free from infection 10/07/2020 2230 by Berta Minor, RN Outcome: Progressing 10/07/2020 2230 by Berta Minor, RN Outcome: Progressing Goal: Diagnostic test results will improve 10/07/2020 2230 by Berta Minor, RN Outcome: Progressing 10/07/2020 2230 by Berta Minor, RN Outcome: Progressing Goal: Respiratory complications will improve 10/07/2020 2230 by Berta Minor, RN Outcome: Progressing 10/07/2020 2230 by Berta Minor, RN Outcome: Progressing Goal: Cardiovascular complication will be avoided 10/07/2020 2230 by Berta Minor, RN Outcome: Progressing 10/07/2020 2230 by Berta Minor, RN Outcome: Progressing   Problem: Activity: Goal: Risk for activity intolerance will decrease 10/07/2020 2230 by Berta Minor, RN Outcome: Progressing 10/07/2020 2230 by Berta Minor, RN Outcome: Progressing   Problem: Nutrition: Goal: Adequate nutrition will be maintained 10/07/2020 2230 by Berta Minor, RN Outcome: Progressing 10/07/2020 2230 by Berta Minor, RN Outcome: Progressing   Problem: Coping: Goal: Level  of anxiety will decrease 10/07/2020 2230 by Berta Minor, RN Outcome: Progressing 10/07/2020 2230 by Berta Minor, RN Outcome: Progressing   Problem: Elimination: Goal: Will not experience complications related to bowel motility 10/07/2020 2230 by Berta Minor, RN Outcome: Progressing 10/07/2020 2230 by Berta Minor, RN Outcome: Progressing Goal: Will not experience complications related to urinary retention 10/07/2020 2230 by Berta Minor, RN Outcome: Progressing 10/07/2020 2230 by Berta Minor, RN Outcome: Progressing   Problem: Pain Managment: Goal: General experience of comfort will improve 10/07/2020 2230 by Berta Minor, RN Outcome: Progressing 10/07/2020 2230 by Berta Minor, RN Outcome: Progressing   Problem: Safety: Goal: Ability to remain free from injury will improve 10/07/2020 2230 by Berta Minor, RN Outcome: Progressing 10/07/2020 2230 by Berta Minor, RN Outcome: Progressing   Problem: Skin Integrity: Goal: Risk for impaired skin integrity will decrease 10/07/2020 2230 by Berta Minor, RN Outcome: Progressing 10/07/2020 2230 by Berta Minor, RN Outcome: Progressing

## 2020-10-08 DIAGNOSIS — R5381 Other malaise: Secondary | ICD-10-CM | POA: Diagnosis not present

## 2020-10-08 DIAGNOSIS — U071 COVID-19: Secondary | ICD-10-CM | POA: Diagnosis not present

## 2020-10-08 DIAGNOSIS — D649 Anemia, unspecified: Secondary | ICD-10-CM | POA: Diagnosis present

## 2020-10-08 DIAGNOSIS — E1121 Type 2 diabetes mellitus with diabetic nephropathy: Secondary | ICD-10-CM | POA: Diagnosis not present

## 2020-10-08 DIAGNOSIS — J9621 Acute and chronic respiratory failure with hypoxia: Secondary | ICD-10-CM | POA: Diagnosis not present

## 2020-10-08 DIAGNOSIS — E440 Moderate protein-calorie malnutrition: Secondary | ICD-10-CM | POA: Diagnosis not present

## 2020-10-08 DIAGNOSIS — L89151 Pressure ulcer of sacral region, stage 1: Secondary | ICD-10-CM | POA: Diagnosis present

## 2020-10-08 DIAGNOSIS — I214 Non-ST elevation (NSTEMI) myocardial infarction: Secondary | ICD-10-CM | POA: Diagnosis not present

## 2020-10-08 DIAGNOSIS — D849 Immunodeficiency, unspecified: Secondary | ICD-10-CM | POA: Diagnosis not present

## 2020-10-08 LAB — GLUCOSE, CAPILLARY
Glucose-Capillary: 87 mg/dL (ref 70–99)
Glucose-Capillary: 88 mg/dL (ref 70–99)
Glucose-Capillary: 216 mg/dL — ABNORMAL HIGH (ref 70–99)
Glucose-Capillary: 182 mg/dL — ABNORMAL HIGH (ref 70–99)

## 2020-10-08 MED ORDER — ONDANSETRON HCL 4 MG/2ML IJ SOLN: 4.0000 mg | Freq: Four times a day (QID) | INTRAMUSCULAR | 0 refills | Status: AC | PRN

## 2020-10-08 MED ORDER — ACETAMINOPHEN 325 MG PO TABS: 650.0000 mg | ORAL_TABLET | Freq: Four times a day (QID) | ORAL | Status: AC | PRN

## 2020-10-08 MED ORDER — FLUTICASONE PROPIONATE 50 MCG/ACT NA SUSP: 1.0000 | Freq: Every day | NASAL | 12 refills | Status: AC

## 2020-10-08 MED ORDER — ENOXAPARIN SODIUM 30 MG/0.3ML ~~LOC~~ SOLN: 30.0000 mg | SUBCUTANEOUS | Status: AC

## 2020-10-08 MED ORDER — LEVOTHYROXINE SODIUM 50 MCG PO TABS: 50.0000 ug | ORAL_TABLET | Freq: Every day | ORAL | Status: AC

## 2020-10-08 MED ORDER — AMLODIPINE BESYLATE 10 MG PO TABS: 10.0000 mg | ORAL_TABLET | Freq: Every day | ORAL | Status: AC

## 2020-10-08 MED ORDER — POLYETHYLENE GLYCOL 3350 17 G PO PACK: 17.0000 g | PACK | Freq: Every day | ORAL | 0 refills | Status: AC | PRN

## 2020-10-08 MED ORDER — GUAIFENESIN 100 MG/5ML PO SOLN: 5.0000 mL | ORAL | 0 refills | Status: AC | PRN

## 2020-10-08 MED ORDER — IPRATROPIUM-ALBUTEROL 0.5-2.5 (3) MG/3ML IN SOLN: 3.0000 mL | Freq: Three times a day (TID) | RESPIRATORY_TRACT | Status: AC

## 2020-10-08 MED ORDER — ISOSORBIDE MONONITRATE ER 60 MG PO TB24: 60.0000 mg | ORAL_TABLET | Freq: Every day | ORAL | Status: AC

## 2020-10-08 MED ORDER — HYDROMORPHONE HCL 2 MG PO TABS: 1.0000 mg | ORAL_TABLET | ORAL | 0 refills | Status: AC | PRN

## 2020-10-08 MED ORDER — BARRIER CREAM NON-SPECIFIED: 1.0000 "application " | TOPICAL_CREAM | Freq: Two times a day (BID) | TOPICAL | Status: AC | PRN

## 2020-10-08 MED ORDER — ORAL CARE MOUTH RINSE: 15.0000 mL | Freq: Two times a day (BID) | OROMUCOSAL | 0 refills | Status: AC

## 2020-10-08 MED ORDER — PREDNISONE 50 MG PO TABS: 50.0000 mg | ORAL_TABLET | Freq: Two times a day (BID) | ORAL | Status: AC

## 2020-10-08 MED ORDER — ALBUTEROL SULFATE HFA 108 (90 BASE) MCG/ACT IN AERS: 2.0000 | INHALATION_SPRAY | RESPIRATORY_TRACT | Status: AC | PRN

## 2020-10-08 MED ORDER — FOLIC ACID 1 MG PO TABS: 1.0000 mg | ORAL_TABLET | Freq: Every day | ORAL | Status: AC

## 2020-10-08 MED ORDER — INSULIN ASPART 100 UNIT/ML ~~LOC~~ SOLN: 2.0000 [IU] | Freq: Three times a day (TID) | SUBCUTANEOUS | 11 refills | Status: AC

## 2020-10-08 MED ORDER — NEPRO/CARBSTEADY PO LIQD: 237.0000 mL | Freq: Two times a day (BID) | ORAL | 0 refills | Status: AC

## 2020-10-08 MED ORDER — LORAZEPAM 2 MG/ML PO CONC: 1.0000 mg | Freq: Two times a day (BID) | ORAL | 0 refills | Status: DC

## 2020-10-08 MED ORDER — INSULIN DETEMIR 100 UNIT/ML ~~LOC~~ SOLN: 25.0000 [IU] | Freq: Two times a day (BID) | SUBCUTANEOUS | 11 refills | Status: AC

## 2020-10-08 MED ORDER — HYDRALAZINE HCL 50 MG PO TABS: 50.0000 mg | ORAL_TABLET | Freq: Three times a day (TID) | ORAL | Status: AC

## 2020-10-08 MED ORDER — INSULIN ASPART 100 UNIT/ML ~~LOC~~ SOLN: 0.0000 [IU] | Freq: Three times a day (TID) | SUBCUTANEOUS | 11 refills | Status: AC

## 2020-10-08 MED ORDER — ACETAMINOPHEN 650 MG RE SUPP
650.0000 mg | Freq: Four times a day (QID) | RECTAL | 0 refills | Status: AC | PRN
Start: 2020-10-08 — End: ?

## 2020-10-08 MED ORDER — PANTOPRAZOLE SODIUM 40 MG PO TBEC: 40.0000 mg | DELAYED_RELEASE_TABLET | Freq: Two times a day (BID) | ORAL | Status: AC

## 2020-10-08 MED ORDER — LOPERAMIDE HCL 2 MG PO CAPS: 2.0000 mg | ORAL_CAPSULE | ORAL | 0 refills | Status: AC | PRN

## 2020-10-08 MED ORDER — SODIUM CHLORIDE 0.9 % IV SOLN: 10.0000 mL | INTRAVENOUS | 0 refills | Status: AC | PRN

## 2020-10-08 NOTE — Progress Notes (Signed)
[facility] PROGRESS NOTE  [name] [name] GMW:[id number] DOB: Dec 18, 1955 DOA: 08/18/2020 PCP: Glendale Chard, MD  Status: Remains inpatient appropriate because:Unsafe d/c plan, IV treatments appropriate due to intensity of illness or inability to take PO and Inpatient level of care appropriate due to severity of illness   Dispo:  Patient From:  HOME  Planned Disposition:  [facility] insurance authorization obtained.  No beds available noting to people in line ahead of patient.  Expected discharge date: 10/10/2020 Medically stable for discharge:  NO-LTAC  Difficult to place: Yes; Barriers to DC: Continues to require very high levels of oxygen secondary to Covid lung injury.  She is physically deconditioned and requires SNF for rehabilitative therapies but given high level of oxygen requirements not a candidate for SNF at this time. 2/28 plan is to dc to LTAC once insurance auth obtained   Level of care: Progressive  Code Status: DNR  Family Communication: Patient; daughter Mendel Ryder 2/21.   DVT prophylaxis: Lovenox Vaccination status: Fully vaccinated against Covid prior to admission.  Initial vaccine November 2021 with follow-up vaccines March and April 2021  Foley catheter: No  HPI: 42 old female with history of renal transplant x2 on chronic immunosuppression agents, diabetes, hypothyroidism who presented with shortness of breath initially.  She was found to be in acute hypoxic respiratory failure due to Covid pneumonia.  Hospital course was prolonged and was complicated by slow improvement from severe hypoxia, AKI and non-STEMI.  Continues to require high flow oxygen.  Patient was a DNR until 2/10 at which point she changed back to full code. Further discussions held w/ pt and dtr and code status changed back to DNR on 2/12   Subjective: Sleeping.  Stated she was more tired than usual today.  Noted with increased work of breathing without associated O2 desaturation.  No  complaints such as chest pain or abdominal discomfort or nausea.  Objective: Vitals:   10/08/20 0452 10/08/20 0508  BP: (!) 153/71 (!) 151/82  Pulse: 95   Resp: 20   Temp:    SpO2: 95%    No intake or output data in the 24 hours ending 10/08/20 0740 Filed Weights   10/01/20 0500 10/02/20 0500 10/07/20 0500  Weight: 86.3 kg 85.9 kg 84.6 kg    Exam:  General: Awakened, calm, mild respiratory distress which is baseline Respiratory: Anterior lung sounds clear to auscultation but diminished in the bases.  Increased work of breathing and tachypnea.  35 L/min HHFNC 50% FiO2 with O2 sats is 91 to 95% Cardiovascular: Mild tachycardia sinus in etiology, no peripheral edema or JVD, skin warm and dry, normal heart sounds Abdomen: Abdomen soft, bowel sounds present.  Normoactive bowel sounds.  Variable oral intake.  LBM 2/28 Neurologic: CN 2-12 grossly intact. Sensation intact, DTR normal. Strength 3-4/5 x all 4 extremities.  Psychiatric: Awake, oriented x3.  Pleasant affect.   Assessment/Plan: Acute problems: Sepsis /acute on chronic hypoxic respiratory failure due to Covid pneumonia/new hospital associated pneumonia as of 2/14:  Fully vaccinated patient.  Immunocompromised status due to renal transplant, immunosuppressive medications.   Presented with multifocal pneumonia from Covid.  Completed treatment with IV steroids, baricitinib, remdesivir.  Also was treated with IV antibiotics for superimposed bacterial pneumonia.   Recent CT of the chest demonstrates progressive pulmonary fibrosis secondary to COVID.   FiO2 had been weaned down to 50% patient thus far tolerating and has been [facility] bed [facility].  This is the safer option in regards to  higher oxygen needs in regards to transport since [facility] located within [facility]. Continue pulmonary toileting, incentive spirometer/flutter valve.   Continue scheduled Ativan every 12 hours and IV Dilaudid 0.5 mg  every 4 hours prn for air hunger and associated anxiety Completed 7 days of cefepime  Continue prednisone to 50 mg BID-no taper ust yet since able to slowly wean Prednisone on higher dose-home dose 5 mg daily  DKA/diabetes mellitus on insulin with hyperglycemia DKA resolved.  No longer on insulin drip.   Continue Levemir to 25 units given- cont resistant SSI  Continue Novolog MC 2 units  CBGs have improved with adjustments in long-acting insulin and are now running between 87 and 217 Hemoglobin A1c of 8.2 as per 1/11.   2/23 CBG at 8 AM had decreased to 45 2/2 missed doses of IV Solumedrol-have changed to Prednisone  AKI on CKD stage IV/transplant kidney:  Transplant x2.  AKI was most likely hemodynamically mediated.   Creatinine has remained acutely around 1.7.   Nephrology has signed off  Continue prednisone, Prograf and Myfortic  Avoid nephrotoxic medications  Chronic normocytic anemia:  Most likely associated  with CKD.  No signs of bleeding.   Status post 1 unit of PRBC transfusion on 09/11/2020.   Hemoglobin 11.8 on 2/21  Deconditioning/debility:  PT/OT recommended skilled nursing facility on discharge but current O2 requirements are too high to facilitate discharge to skilled nursing facility. [facility] reviewed chart and now [facility] for [facility]  Stage I decubitus coccyx Pressure Injury 08/20/20 Coccyx Mid Stage 1 -  Intact skin with non-blanchable redness of a localized area usually over a bony prominence. (Active)  Date First Assessed/Time First Assessed: 08/20/20 0800   Location: Coccyx  Location Orientation: Mid  Staging: Stage 1 -  Intact skin with non-blanchable redness of a localized area usually over a bony prominence.  Present on Admission: Yes    Assessments 08/20/2020  9:00 AM 10/07/2020  7:21 PM  Dressing Type Foam - Lift dressing to assess site every shift Foam - Lift dressing to assess site every shift  Dressing Changed Clean;Dry;Intact  Dressing Change Frequency  Every 3 days PRN  Site / Wound Assessment Clean;Dry;Pink --  Wound Length (cm) 5 cm --  Wound Width (cm) 0.1 cm --  Wound Depth (cm) 0 cm --  Wound Surface Area (cm^2) 0.5 cm^2 --  Wound Volume (cm^3) 0 cm^3 --  Drainage Amount None None  Treatment Cleansed;Off loading --     No Linked orders to display     Wound / Incision (Open or Dehisced) 08/31/20 Puncture Abdomen Left;Lower From Heparin SQ injections. (Active)  Date First Assessed/Time First Assessed: 08/31/20 2000   Wound Type: Puncture  Location: Abdomen  Location Orientation: Left;Lower  Wound Description (Comments): From Heparin SQ injections.  Present on Admission: No    Assessments 08/31/2020  8:00 PM 10/01/2020  8:00 PM  Dressing Status -- Clean;Dry;Intact  Wound Length (cm) 0 cm --  Wound Width (cm) 0 cm --  Wound Depth (cm) 0 cm --  Wound Volume (cm^3) 0 cm^3 --  Wound Surface Area (cm^2) 0 cm^2 --     No Linked orders to display       Other problems: Transaminitis:  Resolved likely secondary to COVID meds as well as hypoperfusion  Right upper quadrant ultrasound is nonacute and showed some sludge/gallstones but no signs of cholecystitis.   Hepatitis panel negative.    Yeast vaginitis -Likely secondary to recent antibiotics, steroids  and resultant -Began intravaginal antifungals on 2/21 x 3 days  NSTEMI:  Patient with chest pain on 1/23 with EKG changes.   Treated with Nitroglycerin, morphine and beta-blocker.   Echo is reassuring with preserved ejection fraction.   Continue aspirin/statin completed 48 hours of IV heparin.   Cardiology signed off.   Sinus pause: Happened on  1/20 with 4.5 seconds pause.   Beta-blocker discontinued.  Currently stable.  Thrombocytopenia:  Most likely associated with acute illness.  Continue to monitor,stable  Complicated UTI:  Completed course of Rocephin.  Hypothyroidism:  Continue Synthyroid.   Repeat TFT in 3 months  Hypertension:  Currently blood pressure  stable.  Continue medications  Hyperlipidemia:  Statin held due to elevated LFTs.   LFTs have normalized so can consider resumption of statin although oral intake remains variable  Obesity:  BMI 33.9  Nutrition Status: Estimated body mass index is 34.11 kg/m as calculated from the following:   Height as of this encounter: 5\' 2"  (1.575 m).   Weight as of this encounter: 84.6 kg.  Poor oral intake 2/2 air hunger          Data Reviewed: Basic Metabolic Panel: Recent Labs  Lab 10/05/20 0054  NA 136  K 5.1  CL 108  CO2 20*  GLUCOSE 264*  BUN 42*  CREATININE 1.57*  CALCIUM 9.5  MG 1.9  PHOS 3.1   Liver Function Tests: No results for input(s): AST, ALT, ALKPHOS, BILITOT, PROT, ALBUMIN in the last 168 hours. No results for input(s): LIPASE, AMYLASE in the last 168 hours. No results for input(s): AMMONIA in the last 168 hours. CBC: Recent Labs  Lab 10/05/20 0054  WBC 11.5*  HGB 12.0  HCT 35.8*  MCV 83.4  PLT 119*   Cardiac Enzymes: No results for input(s): CKTOTAL, CKMB, CKMBINDEX, TROPONINI in the last 168 hours. BNP (last 3 results) Recent Labs    09/11/20 0155 09/12/20 0113 09/15/20 0028  BNP 86.8 80.1 80.0    ProBNP (last 3 results) No results for input(s): PROBNP in the last 8760 hours.  CBG: Recent Labs  Lab 10/06/20 1921 10/07/20 0751 10/07/20 1233 10/07/20 1640 10/07/20 2113  GLUCAP 161* 90 152* 217* 135*    No results found for this or any previous visit (from the past 240 hour(s)).   Studies: No results found.  Scheduled Meds: . amLODipine  10 mg Oral Daily  . vitamin C  500 mg Oral Daily  . aspirin EC  81 mg Oral Daily  . enoxaparin (LOVENOX) injection  30 mg Subcutaneous Q24H  . feeding supplement (NEPRO CARB STEADY)  237 mL Oral BID BM  . fluticasone  1 spray Each Nare Daily  . folic acid  1 mg Oral Daily  . hydrALAZINE  50 mg Oral Q8H  . insulin aspart  0-20 Units Subcutaneous TID WC  . insulin aspart  2 Units  Subcutaneous TID WC  . insulin detemir  25 Units Subcutaneous BID  . ipratropium-albuterol  3 mL Nebulization TID  . isosorbide mononitrate  60 mg Oral Daily  . levothyroxine  50 mcg Oral Q0600  . LORazepam  1 mg Oral BID  . mouth rinse  15 mL Mouth Rinse BID  . mycophenolate  360 mg Oral BID  . pantoprazole  40 mg Oral BID  . predniSONE  50 mg Oral BID WC  . tacrolimus  3 mg Oral QPM  . tacrolimus  4 mg Oral Daily  . zinc sulfate  220 mg  Oral Daily   Continuous Infusions: . sodium chloride    . sodium chloride Stopped (08/31/20 1545)    Principal Problem:   ARF (acute renal failure) (HCC) Active Problems:   AKI (acute kidney injury) ([place])   Fever, unspecified   Renal transplant recipient   Immunocompromised ([place])   DM (diabetes mellitus), type 2 with renal complications ([place])   Essential hypertension with goal blood pressure less than 140/90   Acute hypoxemic respiratory failure due to COVID-19 ([place])   Respiratory failure (HCC)   Acute hyperkalemia   Diabetic ketoacidosis without coma associated with type 1 diabetes mellitus ([place])   Pressure injury of skin   Acute and chronic respiratory failure with hypoxia ([place])   HCAP (healthcare-associated pneumonia)   Pulmonary fibrosis, postinflammatory (HCC)   Transaminitis   Controlled type 2 diabetes mellitus with hyperglycemia, with long-term current use of insulin ([place])   Consultants:  None  Procedures:  Echocardiogram  Antibiotics: Anti-infectives (From admission, onward)   Start     Dose/Rate Route Frequency Ordered Stop   09/24/20 1200  vancomycin (VANCOREADY) IVPB 1000 mg/200 mL  Status:  Discontinued        1,000 mg 200 mL/hr over 60 Minutes Intravenous Every 48 hours 09/22/20 1036 09/24/20 1013   09/22/20 1130  vancomycin (VANCOREADY) IVPB 1750 mg/350 mL        1,750 mg 175 mL/hr over 120 Minutes Intravenous  Once 09/22/20 1036 09/22/20 1735   09/22/20 1130  ceFEPIme (MAXIPIME) 2 g in sodium chloride 0.9 %  100 mL IVPB        2 g 200 mL/hr over 30 Minutes Intravenous Every 24 hours 09/22/20 1036 09/28/20 0052   09/07/20 1030  vancomycin (VANCOCIN) IVPB 1000 mg/200 mL premix  Status:  Discontinued        1,000 mg 200 mL/hr over 60 Minutes Intravenous Every 48 hours 09/05/20 0933 09/07/20 0951   09/05/20 1030  vancomycin (VANCOREADY) IVPB 2000 mg/400 mL        2,000 mg 200 mL/hr over 120 Minutes Intravenous  Once 09/05/20 0933 09/05/20 2018   09/05/20 1030  ceFEPIme (MAXIPIME) 2 g in sodium chloride 0.9 % 100 mL IVPB  Status:  Discontinued        2 g 200 mL/hr over 30 Minutes Intravenous Every 24 hours 09/05/20 0933 09/07/20 0951   08/20/20 1445  cefTRIAXone (ROCEPHIN) 1 g in sodium chloride 0.9 % 100 mL IVPB        1 g 200 mL/hr over 30 Minutes Intravenous Every 24 hours 08/20/20 1348 08/26/20 2147   08/20/20 1000  remdesivir 100 mg in sodium chloride 0.9 % 100 mL IVPB       "Followed by" Linked Group Details   100 mg 200 mL/hr over 30 Minutes Intravenous Daily 08/19/20 0021 08/23/20 1113   08/19/20 0130  remdesivir 200 mg in sodium chloride 0.9% 250 mL IVPB       "Followed by" Linked Group Details   200 mg 580 mL/hr over 30 Minutes Intravenous Once 08/19/20 0021 08/19/20 0314       Time spent: 20 minutes    Erin Hearing ANP  [facility] 7 am - 330 pm/M-F for direct patient care and secure chat Please refer to Amion for contact info 50  days

## 2020-10-08 NOTE — Discharge Summary (Addendum)
Physician Discharge Summary  [name] [name] OMV:[id number] DOB: August 21, 1955 DOA: 08/18/2020  PCP: Glendale Chard, MD  Admit date: 08/18/2020 Discharge date: 10/26/2020  Time spent: 48 minutes  Recommendations for Outpatient Follow-up:  1. Patient will discharge to [facility]    Discharge Diagnoses:  Active Problems:   Renal transplant recipient   Immunocompromised ([place])   DM (diabetes mellitus), type 2 with renal complications ([place])   Essential hypertension with goal blood pressure less than 140/90   HLD (hyperlipidemia)   Acute hypoxemic respiratory failure due to COVID-19 (HCC)   Respiratory failure (HCC)   Acute and chronic respiratory failure with hypoxia (HCC)   Pulmonary fibrosis, postinflammatory (HCC)   Controlled type 2 diabetes mellitus with hyperglycemia, with long-term current use of insulin (HCC)   Decubitus ulcer of coccygeal region, stage 1   Normocytic anemia   Physical deconditioning   NSTEMI (non-ST elevated myocardial infarction) ([place])   Moderate protein-calorie malnutrition ([place])   Uncontrolled type 2 diabetes mellitus with hyperglycemia, with long-term current use of insulin ([place])  Discharge Condition: Stable  Diet recommendation: Carbohydrate modified  Filed Weights   10/02/20 0500 10/07/20 0500 10/28/2020 0500  Weight: 85.9 kg 84.6 kg 84.9 kg    History of present illness:  89 old female with history of renal transplant x2 on chronic immunosuppression agents, diabetes, hypothyroidism who presented with shortness of breath initially. She was found to be in acute hypoxic respiratory failure due to Covid pneumonia. Hospital course was prolonged and was complicated by slow improvement from severe hypoxia, AKI and non-STEMI. Continues to require high flow oxygen.  Patient was a DNR until 2/10 at which point she changed back to full code. Further discussions held w/ pt and dtr and code status changed back to DNR on 2/12  Hospital  Course:   Sepsis, POA Acute on chronic hypoxic respiratory failure, POA Covid-19 viral pneumonia Community-acquired pneumonia Patient presenting to ED with progressive shortness of breath, found to be Covid-19 postive with multifocal pneumonia on chest x-ray.  Completed treatment with steroids, baricitinib, remdesivir.  Patient was also treated with superimposed bacterial pneumonia and completed 7-day course of cefepime.  CT chest demonstrates progressive pulmonary fibrosis secondary to Covid-19 infection. --Prednisone 50 mg p.o. twice daily (on $RemoveB'5mg'[id number]$  PO daily at baseline) --Ativan every 12 hours anxiety --Dilaudid as needed air hunger/anxiety --Continues on 30 L high flow nasal cannula with SPO2 95% today --Pending transfer to LTAC pending bed availability  Diabetic ketoacidosis Type 2 diabetes mellitus, with hyperglycemia Initially during hospitalization, in DKA requiring insulin drip; which has been subsequently titrated off.  Hemoglobin A1c 8.2 08/19/2020. --Levemir 25 units twice daily --NovoLog 2 units 3 times daily AC --SSI for coverage --CBG before every meal/at bedtime  AKI on CKD stage IV History of renal transplant Creatinine stable, 1.35 today. --Continue prednisone, higher dose than baseline as above --Continue Prograf and Myfortic  Anemia of chronic kidney disease Transfused 1 unit PRBC on 09/11/2020.  Hemoglobin stable, 12.4 10/12/20.  Stage I decubitus ulcer to coccyx Pressure Injury 08/20/20 Coccyx Mid Stage 1 -  Intact skin with non-blanchable redness of a localized area usually over a bony prominence. (Active)  08/20/20 0800  Location: Coccyx  Location Orientation: Mid  Staging: Stage 1 -  Intact skin with non-blanchable redness of a localized area usually over a bony prominence.  Wound Description (Comments):   Present on Admission: Yes     NSTEMI:  Patient with chest pain on 1/23 with EKG changes.  Treated with  Nitroglycerin, morphine and beta-blocker.   Echo is reassuring with preserved ejection fraction.  Continue aspirin/statin completed 48 hours of IV heparin.  Cardiology signed off.   Sinus pause: Episode occurring on1/20 with 4.5 seconds pause.  Beta-blocker discontinued. Currently stable.  Thrombocytopenia:  Most likely associated with acute illness. Continue to monitor,stable  Complicated UTI:  Completed course of Rocephin.  Hypothyroidism:  Continue Synthyroid.  Repeat TFT in 3 months  Hypertension:  Currently blood pressure stable. Continue medications  Hyperlipidemia: Statin held due to elevated LFTs.  LFTs have normalized so can consider resumption of statin although oral intake remains variable  Obesity: BMI 33.9.  Discussed with patient needs for aggressive lifestyle changes/weight loss as this complicates all facets of care.  Outpatient follow-up with PCP.    Moderate protein calorie malnutrition:3 Body mass index is 34.23 kg/m.  Because of ongoing hypoxemia and dyspnea patient with marginal oral intake. Continue carb modified diet. Continue Nepro carb steady shakes. Continue Folvite  Deconditioning/debility:  PT/OT recommended skilled nursing facility on discharge but current O2 requirements are too high to facilitate discharge to skilled nursing facility.  Discharging to select LTAC.  Yeast vaginitis Likely secondary to recent antibiotics, steroids and resultant.  Completed course of intravaginal antifungal.  Thrombocytopenia:  Most likely associated with acute illness. Continue to monitor,stable  Complicated UTI:  Completed course of Rocephin.  Hypothyroidism:  Continue Synthyroid.  Repeat TFT in 3 months  Hypertension:  Currently blood pressure stable. Continue medications  Hyperlipidemia: Statin held due to elevated LFTs.  LFTs have normalized so can consider resumption of statin although oral intake remains variable  Obesity: BMI  33.9   Procedures:  Echocardiogram   Consultations:  None   Discharge Exam: Vitals:   10/10/2020 1300 11/06/2020 1400  BP: 130/60 (!) 142/68  Pulse:    Resp: (!) 31 (!) 32  Temp:    SpO2: 90% 91%   General: Awakened, calm, mild respiratory distress which is baseline Respiratory: Anterior lung sounds clear to auscultation but diminished in the bases.  Increased work of breathing and tachypnea.  40 L/min HHFNC 50% FiO2 with O2 sats is 91 to 96% Cardiovascular: Mild tachycardia sinus in etiology, no peripheral edema or JVD, skin warm and dry, normal heart sounds Abdomen: Abdomen soft, bowel sounds present.  Normoactive bowel sounds.  Variable oral intake.  LBM 2/28 Neurologic: CN 2-12 grossly intact. Sensation intact, DTR normal. Strength 3-4/5 x all 4 extremities.  Psychiatric: Awake, oriented x3.  Pleasant affect.   Discharge Instructions   Discharge Instructions    Diet - low sodium heart healthy   Complete by: As directed    Discharge wound care:   Complete by: As directed    Continue daily dressing changes and offloading to stage II pressure injury coccyx   Increase activity slowly   Complete by: As directed      Allergies as of 10/11/2020      Reactions   Codeine Hives   Acetaminophen-codeine Rash      Medication List    STOP taking these medications   Accu-Chek Guide Me w/Device Kit   Accu-Chek Guide w/Device Kit   Alcohol Prep Pads   cinacalcet 30 MG tablet Commonly known as: SENSIPAR   diazepam 2 MG tablet Commonly known as: Valium   glucose blood test strip   Gvoke HypoPen 2-Pack 1 MG/0.2ML [place] Generic drug: Glucagon   lisinopril 10 MG tablet Commonly known as: ZESTRIL   metoprolol tartrate 25 MG tablet Commonly known as: [employer]  mometasone 50 MCG/ACT nasal spray Commonly known as: Nasonex Replaced by: fluticasone 50 MCG/ACT nasal spray   NOVOLOG FLEXPEN [place] Replaced by: insulin aspart 100 UNIT/ML injection   omeprazole 20 MG  capsule Commonly known as: PRILOSEC Replaced by: pantoprazole 40 MG tablet   simvastatin 10 MG tablet Commonly known as: ZOCOR     TAKE these medications   acetaminophen 325 MG tablet Commonly known as: TYLENOL Take 2 tablets (650 mg total) by mouth every 6 (six) hours as needed for mild pain (or Fever >/= 101).   acetaminophen 650 MG suppository Commonly known as: TYLENOL Place 1 suppository (650 mg total) rectally every 6 (six) hours as needed for mild pain (or Fever >/= 101).   albuterol 108 (90 Base) MCG/ACT inhaler Commonly known as: VENTOLIN HFA Inhale 2 puffs into the lungs every 4 (four) hours as needed for shortness of breath.   amLODipine 10 MG tablet Commonly known as: NORVASC Take 1 tablet (10 mg total) by mouth daily. What changed:   medication strength  how much to take   aspirin EC 81 MG tablet Take 81 mg by mouth daily.   barrier cream Crea Commonly known as: non-specified Apply 1 application topically 2 (two) times daily as needed.   enoxaparin 30 MG/0.3ML injection Commonly known as: LOVENOX Inject 0.3 mLs (30 mg total) into the skin daily.   feeding supplement (NEPRO CARB STEADY) Liqd Take 237 mLs by mouth 2 (two) times daily between meals.   fluticasone 50 MCG/ACT nasal spray Commonly known as: FLONASE Place 1 spray into both nostrils daily. Replaces: mometasone 50 MCG/ACT nasal spray   folic acid 1 MG tablet Commonly known as: FOLVITE Take 1 tablet (1 mg total) by mouth daily.   guaiFENesin 100 MG/5ML Soln Commonly known as: ROBITUSSIN Take 5 mLs (100 mg total) by mouth every 4 (four) hours as needed for cough or to loosen phlegm.   hydrALAZINE 50 MG tablet Commonly known as: APRESOLINE Take 1 tablet (50 mg total) by mouth every 8 (eight) hours.   HYDROmorphone 2 MG tablet Commonly known as: DILAUDID Take 0.5 tablets (1 mg total) by mouth every 4 (four) hours as needed for severe pain Air traffic controller hunger).   insulin aspart 100 UNIT/ML  injection Commonly known as: novoLOG Inject 0-20 Units into the skin 3 (three) times daily with meals. Correction coverage: Resistant (obese, steroids)  CBG < 70: Implement Hypoglycemia Standing Orders and refer to Hypoglycemia Standing Orders sidebar report  CBG 70 - 120: 0 units  CBG 121 - 150: 3 units  CBG 151 - 200: 4 units  CBG 201 - 250: 7 units  CBG 251 - 300: 11 units  CBG 301 - 350: 15 units  CBG 351 - 400: 20 units  CBG > 400 call MD and obtain STAT lab verification Replaces: NOVOLOG FLEXPEN [place]   insulin aspart 100 UNIT/ML injection Commonly known as: novoLOG Inject 2 Units into the skin 3 (three) times daily with meals.   insulin detemir 100 UNIT/ML injection Commonly known as: LEVEMIR Inject 0.25 mLs (25 Units total) into the skin 2 (two) times daily. What changed:   how much to take  when to take this  additional instructions   ipratropium-albuterol 0.5-2.5 (3) MG/3ML Soln Commonly known as: DUONEB Take 3 mLs by nebulization 3 (three) times daily.   isosorbide mononitrate 60 MG 24 hr tablet Commonly known as: IMDUR Take 1 tablet (60 mg total) by mouth daily.   levothyroxine 50 MCG tablet Commonly known  as: SYNTHROID Take 1 tablet (50 mcg total) by mouth daily at 6 (six) AM. What changed:   medication strength  how much to take  when to take this   loperamide 2 MG capsule Commonly known as: IMODIUM Take 1 capsule (2 mg total) by mouth as needed for diarrhea or loose stools. What changed:   how much to take  additional instructions   LORazepam 2 MG/ML concentrated solution Commonly known as: ATIVAN Take 0.5 mLs (1 mg total) by mouth 2 (two) times daily.   mouth rinse Liqd solution 15 mLs by Mouth Rinse route 2 (two) times daily.   mycophenolate 180 MG EC tablet Commonly known as: MYFORTIC Take 360 mg by mouth 2 (two) times daily.   ondansetron 4 MG/2ML Soln injection Commonly known as: ZOFRAN Inject 2 mLs (4 mg total) into the vein  every 6 (six) hours as needed for nausea or vomiting.   pantoprazole 40 MG tablet Commonly known as: PROTONIX Take 1 tablet (40 mg total) by mouth 2 (two) times daily. Replaces: omeprazole 20 MG capsule   polyethylene glycol 17 g packet Commonly known as: MIRALAX / GLYCOLAX Take 17 g by mouth daily as needed for moderate constipation.   predniSONE 50 MG tablet Commonly known as: DELTASONE Take 1 tablet (50 mg total) by mouth 2 (two) times daily with a meal. What changed:   medication strength  how much to take  when to take this   sodium chloride 0.9 % infusion Inject 10 mLs into the vein as needed (for administration of IV medications (carrier fluid)).   tacrolimus 1 MG capsule Commonly known as: PROGRAF Take 3 capsules (3 mg total) by mouth every evening. What changed: You were already taking a medication with the same name, and this prescription was added. Make sure you understand how and when to take each.   tacrolimus 1 MG capsule Commonly known as: PROGRAF Take 4 capsules (4 mg total) by mouth daily. Start taking on: October 14, 2020 What changed:   how much to take  how to take this  when to take this  additional instructions            Discharge Care Instructions  (From admission, onward)         Start     Ordered   10/29/2020 0000  Discharge wound care:       Comments: Continue daily dressing changes and offloading to stage II pressure injury coccyx   10/30/2020 1103         Allergies  Allergen Reactions   Codeine Hives   Acetaminophen-Codeine Rash      The results of significant diagnostics from this hospitalization (including imaging, microbiology, ancillary and laboratory) are listed below for reference.    Significant Diagnostic Studies: CT CHEST WO CONTRAST  Result Date: 09/24/2020 CLINICAL DATA:  Inpatient. Acute respiratory failure with hypoxia due to COVID 19 pneumonia, now with persistent hypoxemia of uncertain etiology. EXAM: CT  CHEST WITHOUT CONTRAST TECHNIQUE: Multidetector CT imaging of the chest was performed following the standard protocol without IV contrast. COMPARISON:  09/22/2020 chest radiograph.  08/24/2020 chest CT. FINDINGS: Cardiovascular: Mild stable cardiomegaly. No significant pericardial effusion/thickening. Atherosclerotic nonaneurysmal thoracic aorta. Top-normal caliber main pulmonary artery (3.0 cm diameter). Vascular stent again noted in the right retropectoral region. Mediastinum/Nodes: No discrete thyroid nodules. Unremarkable esophagus. No pathologically enlarged axillary, mediastinal or hilar lymph nodes, noting limited sensitivity for the detection of hilar adenopathy on this noncontrast study. Lungs/Pleura: No pneumothorax. No  pleural effusion. Diffuse thickening of the peribronchovascular interstitium with associated moderate traction bronchiectasis, volume loss and distortion, largely new since 08/24/2020 chest CT. Persistent patchy ground-glass opacity throughout both lungs, significantly decreased in the interval. No superimposed acute consolidative airspace disease. No discrete lung masses or significant pulmonary nodules. Upper abdomen: Small hiatal hernia. Musculoskeletal: No aggressive appearing focal osseous lesions. Marked thoracic spondylosis. IMPRESSION: 1. Spectrum of findings compatible with evolving severe postinflammatory fibrosis due to history of COVID-19 pneumonia. Diffuse thickening of the peribronchovascular interstitium with associated moderate traction bronchiectasis, volume loss and distortion, largely new since 08/24/2020 chest CT. Persistent patchy ground-glass opacity throughout both lungs, significantly decreased in the interval. Suggest follow-up high-resolution chest CT study in 3-6 months. 2. Mild stable cardiomegaly. 3. Small hiatal hernia. 4. Aortic Atherosclerosis (ICD10-I70.0). Electronically Signed   By: Ilona Sorrel M.D.   On: 09/24/2020 19:39   DG CHEST PORT 1  VIEW  Result Date: 10/09/2020 CLINICAL DATA:  Shortness of breath EXAM: PORTABLE CHEST 1 VIEW COMPARISON:  September 29, 2020 FINDINGS: The heart size and mediastinal contours are within normal limits. Interval slight worsening in the fluffy patchy airspace opacities throughout both lungs. Probable trace bilateral pleural effusions are seen. No acute osseous abnormality. IMPRESSION: Interval slight worsening in the airspace opacities which could be due to multifocal pneumonia Electronically Signed   By: Prudencio Pair M.D.   On: 10/09/2020 17:50   DG CHEST PORT 1 VIEW  Result Date: 09/29/2020 CLINICAL DATA:  Acute hypoxemic respiratory failure. EXAM: PORTABLE CHEST 1 VIEW COMPARISON:  September 22, 2020. FINDINGS: Stable cardiomegaly. No pneumothorax or significant pleural effusion is noted. Stable bilateral lung opacities are noted concerning for multifocal pneumonia. Bony thorax is unremarkable. IMPRESSION: Stable bilateral lung opacities are noted concerning for multifocal pneumonia. Electronically Signed   By: Marijo Conception M.D.   On: 09/29/2020 11:43   DG CHEST PORT 1 VIEW  Result Date: 09/22/2020 CLINICAL DATA:  Hypoxia. EXAM: PORTABLE CHEST 1 VIEW COMPARISON:  September 12, 2020. FINDINGS: Stable cardiomegaly. No pneumothorax or significant pleural effusion is noted. Stable bilateral diffuse patchy airspace opacities are noted consistent with multifocal pneumonia. Bony thorax is unremarkable. IMPRESSION: Stable bilateral diffuse patchy airspace opacities consistent with multifocal pneumonia. Electronically Signed   By: Marijo Conception M.D.   On: 09/22/2020 08:11   VAS [place] LOWER EXTREMITY VENOUS (DVT)  Result Date: 09/24/2020  Lower Venous DVT Study Indications: D-Dimer.  Risk Factors: Immobility. Comparison Study: No previous Performing Technologist: Vonzell Schlatter RVT  Examination Guidelines: A complete evaluation includes B-mode imaging, spectral Doppler, color Doppler, and power Doppler as needed of  all accessible portions of each vessel. Bilateral testing is considered an integral part of a complete examination. Limited examinations for reoccurring indications may be performed as noted. The reflux portion of the exam is performed with the patient in reverse Trendelenburg.  +---------+---------------+---------+-----------+----------+--------------+  RIGHT     Compressibility Phasicity Spontaneity Properties Thrombus Aging  +---------+---------------+---------+-----------+----------+--------------+  CFV       Full            Yes       Yes                                    +---------+---------------+---------+-----------+----------+--------------+  SFJ       Full                                                             +---------+---------------+---------+-----------+----------+--------------+  FV Prox   Full                                                             +---------+---------------+---------+-----------+----------+--------------+  FV Mid    Full                                                             +---------+---------------+---------+-----------+----------+--------------+  FV Distal Full                                                             +---------+---------------+---------+-----------+----------+--------------+  PFV       Full                                                             +---------+---------------+---------+-----------+----------+--------------+  POP       Full            Yes       Yes                                    +---------+---------------+---------+-----------+----------+--------------+  PTV       Full                                                             +---------+---------------+---------+-----------+----------+--------------+  PERO      Full                                                             +---------+---------------+---------+-----------+----------+--------------+    +---------+---------------+---------+-----------+----------+--------------+  LEFT      Compressibility Phasicity Spontaneity Properties Thrombus Aging  +---------+---------------+---------+-----------+----------+--------------+  CFV       Full            Yes       Yes                                    +---------+---------------+---------+-----------+----------+--------------+  SFJ       Full                                                             +---------+---------------+---------+-----------+----------+--------------+  FV Prox   Full                                                             +---------+---------------+---------+-----------+----------+--------------+  FV Mid    Full                                                             +---------+---------------+---------+-----------+----------+--------------+  FV Distal Full                                                             +---------+---------------+---------+-----------+----------+--------------+  PFV       Full                                                             +---------+---------------+---------+-----------+----------+--------------+  POP       Full            Yes       Yes                                    +---------+---------------+---------+-----------+----------+--------------+  PTV       Full                                                             +---------+---------------+---------+-----------+----------+--------------+  PERO      Full                                                             +---------+---------------+---------+-----------+----------+--------------+  Summary: BILATERAL: - No evidence of deep vein thrombosis seen in the lower extremities, bilaterally. -No evidence of popliteal cyst, bilaterally.   *See table(s) above for measurements and observations. Electronically signed by Harold Barban MD on 09/24/2020 at 9:48:14 PM.    Final     Microbiology: No results found for this or any previous visit  (from the past 240 hour(s)).   Labs: Basic Metabolic Panel: Recent Labs  Lab 10/11/20 0115  NA 135  K 4.6  CL 106  CO2 19*  GLUCOSE 142*  BUN 43*  CREATININE 1.35*  CALCIUM 9.9   Liver Function Tests: Recent Labs  Lab 10/11/20 0115  AST 16  ALT 31  ALKPHOS 62  BILITOT 0.9  PROT 5.1*  ALBUMIN 2.9*   No results for input(s): LIPASE, AMYLASE in the last 168 hours. No results for input(s): AMMONIA in the last 168 hours. CBC: Recent Labs  Lab 10/11/20 0115  WBC 15.5*  NEUTROABS 14.7*  HGB 12.4  HCT 35.4*  MCV 81.0  PLT 80*   Cardiac Enzymes: No results for input(s): CKTOTAL, CKMB, CKMBINDEX, TROPONINI in the last 168 hours. BNP: BNP (last 3 results) Recent Labs    09/11/20 0155 09/12/20 0113 09/15/20 0028  BNP 86.8 80.1 80.0    ProBNP (last 3 results) No results for input(s): PROBNP in the last 8760 hours.  CBG: Recent Labs  Lab 10/12/20 1549 10/12/20 2123 10/12/20 2220 10/22/2020 0817 10/23/2020 1208  GLUCAP 159* 224* 258* 171* 166*       Signed:  Varun Jourdan J [place] DO [facility] 10/07/2020, 2:31 PM

## 2020-10-08 NOTE — Progress Notes (Signed)
Occupational Therapy Treatment Patient Details Name: [name] [name] MRN: [id number] DOB: 04-Mar-1956 Today's Date: 10/08/2020    History of present illness Pt is a 65 y.o. female who tested (+) COVID-19 on 08/13/20, now admitted 08/18/20 with worsening SOB, cough, chest pain and body aches. Acute hypoxic respiratory failure due to COVID-19 PNA, DKA, AKI on CKD, UTI. CP on 1/22 with NSTEMI . PMHx: HTN, DM2, chronic steroid use, s/p renal transplant x2.   OT comments  Pt received in bed, optimistic about the possibility of discharging to next level of care soon. On entry, pt reporting need for BM. OT assisting in setting up BSC in prep for transfer. However, pt reported BM actively occurring in bed thus somewhat limiting session. Pt assisted for posterior hygiene bed level at Max A and repositioning in bed. After repositioning, pt reporting second BM occurring - collaborated with nursing staff for further assistance. Pt notably fatigued after these activities though SpO2 >85% on 35 L HHFNC, 50% FiO2. Plan to progress BSC transfers as able in next session.    Follow Up Recommendations  SNF;LTACH;Supervision/Assistance - 24 hour    Equipment Recommendations  3 in 1 bedside commode;Hospital bed;Wheelchair (measurements OT);Wheelchair cushion (measurements OT)    Recommendations for Other Services      Precautions / Restrictions Precautions Precautions: Fall Precaution Comments: watch SpO2 quick to desaturate with HHFNC Restrictions Weight Bearing Restrictions: No       Mobility Bed Mobility Overal bed mobility: Needs Assistance Bed Mobility: Rolling Rolling: Supervision         General bed mobility comments: Supervision for rolling side to side with bedrail and HOB slightly elevated    Transfers                      Balance                                           ADL either performed or assessed with clinical judgement   ADL Overall ADL's :  Needs assistance/impaired                 Upper Body Dressing : Minimal assistance;Bed level Upper Body Dressing Details (indicate cue type and reason): Min A for doffing/donning clean gown in bed, difficulty problem solving line mgmt         Toileting- Clothing Manipulation and Hygiene: Maximal assistance;Bed level Toileting - Clothing Manipulation Details (indicate cue type and reason): On entry, pt reports need for BM. Setup BSC for transfer with pt reporting BM actively happening in bed. Max A for cleanup       General ADL Comments: Limited by severe deficits in cardiopulmonary tolerance, increasing weakness due to decreased tolerance for OOB tasks     Vision   Vision Assessment?: No apparent visual deficits   Perception     Praxis      Cognition Arousal/Alertness: Awake/alert Behavior During Therapy: WFL for tasks assessed/performed Overall Cognitive Status: Impaired/Different from baseline Area of Impairment: Safety/judgement;Awareness                         Safety/Judgement: Decreased awareness of deficits;Decreased awareness of safety Awareness: Emergent   General Comments: decreased awareness of true medical complexities        Exercises     Shoulder Instructions       General Comments Received  on 25 L HHFNC, 50% FiO2, 92% at rest. Remained 85% and above 3/4 DOE    Pertinent Vitals/ Pain       Pain Assessment: No/denies pain  Home Living                                          Prior Functioning/Environment              Frequency  Min 2X/week        Progress Toward Goals  OT Goals(current goals can now be found in the care plan section)  Progress towards OT goals: Progressing toward goals  Acute Rehab OT Goals Patient Stated Goal: to breathe better, be able to get up and get to chair, walk to bathroom, do what I was doing before OT Goal Formulation: With patient Time For Goal Achievement:  10/22/20 Potential to Achieve Goals: Fair ADL Goals Pt Will Perform Grooming: with set-up;sitting Pt Will Perform Lower Body Bathing: with min assist;sit to/from stand Pt Will Perform Upper Body Dressing: with min assist;sitting Pt Will Perform Lower Body Dressing: with mod assist;sit to/from stand Pt Will Transfer to Toilet: with min guard assist;stand pivot transfer;bedside commode Pt Will Perform Toileting - Clothing Manipulation and hygiene: with min assist;sit to/from stand Pt/caregiver will Perform Home Exercise Program: Increased strength;Both right and left upper extremity;With written HEP provided;Independently;With theraband Additional ADL Goal #1: Pt will independently demonstrate carryover of energy conservation techniques during ADL/functional task.  Plan Discharge plan remains appropriate    Co-evaluation                 AM-PAC OT "6 Clicks" Daily Activity     Outcome Measure   Help from another person eating meals?: None Help from another person taking care of personal grooming?: A Little Help from another person toileting, which includes using toliet, bedpan, or urinal?: A Lot Help from another person bathing (including washing, rinsing, drying)?: A Lot Help from another person to put on and taking off regular upper body clothing?: A Little Help from another person to put on and taking off regular lower body clothing?: A Lot 6 Click Score: 16    End of Session Equipment Utilized During Treatment: Oxygen  OT Visit Diagnosis: Unsteadiness on feet (R26.81)   Activity Tolerance Other (comment) (limited by upset stomach)   Patient Left in bed;with call bell/phone within reach   Nurse Communication Mobility status;Other (comment) (second BM at OT exit)        Time: 0922-0945 OT Time Calculation (min): 23 min  Charges: OT General Charges $OT Visit: 1 Visit OT Treatments $Self Care/Home Management : 23-37 mins  Malachy Chamber, OTR/L Acute Rehab  Services Office: [phone]   Layla Maw 10/08/2020, 10:02 AM

## 2020-10-08 NOTE — Plan of Care (Signed)

## 2020-10-08 NOTE — TOC Progression Note (Addendum)
Transition of Care [facility]) - Progression Note    Patient Details  Name: [name] [name] MRN: [id number] Date of Birth: [birth year]  Transition of Care [facility]) CM/SW [place], RN Phone Number: 10/08/2020, 11:57 AM  Clinical Narrative:    Case management spoke with [name] [name], CM at [facility] and the facility may have an available admission bed this afternoon or most likely by the morning.  [name] Hearing, NP is aware to patient's pending discharge to [place] [place] today or tomorrow and will place a discharge summary accordingly.  I spoke with the patient at the bedside and updated her that [facility] may have a bed for her today or tomorrow for LTAC.  CM and MSW will continue to follow the patient for LTAC admission to [facility] once a bed is available.  10/08/2020 1255 - CM spoke with [name] [name], CM and the facility will have an admission bed available tomorrow.  I called and left a message with the patient on her cell phone.  I spoke to the patient's daughter, [name] [name] and updated on her on bed availability and transfer planned for tomorrow.   Expected Discharge Plan: Long Term Acute Care (LTAC) Barriers to Discharge: Continued Medical Work up  Expected Discharge Plan and Services Expected Discharge Plan: [place] (LTAC) In-house Referral: Clinical Social Work Discharge Planning Services: CM Consult Post Acute Care Choice: [place] arrangements for the past 2 months: Single Family Home                                       Social Determinants of Health (SDOH) Interventions    Readmission Risk Interventions Readmission Risk Prevention Plan 09/19/2020  Transportation Screening Complete  Medication Review Press photographer) Complete  PCP or Specialist appointment within 3-5 days of discharge Complete  HRI or Home Care Consult Complete  SW Recovery  Care/Counseling Consult Complete  Palliative Care Screening Complete  Skilled Nursing Facility Complete  Some recent data might be hidden

## 2020-10-09 ENCOUNTER — Inpatient Hospital Stay (HOSPITAL_COMMUNITY): Payer: HMO

## 2020-10-09 DIAGNOSIS — J9601 Acute respiratory failure with hypoxia: Secondary | ICD-10-CM | POA: Diagnosis not present

## 2020-10-09 DIAGNOSIS — E1165 Type 2 diabetes mellitus with hyperglycemia: Secondary | ICD-10-CM

## 2020-10-09 DIAGNOSIS — J9621 Acute and chronic respiratory failure with hypoxia: Secondary | ICD-10-CM | POA: Diagnosis not present

## 2020-10-09 DIAGNOSIS — U071 COVID-19: Secondary | ICD-10-CM | POA: Diagnosis not present

## 2020-10-09 DIAGNOSIS — Z794 Long term (current) use of insulin: Secondary | ICD-10-CM

## 2020-10-09 LAB — GLUCOSE, CAPILLARY
Glucose-Capillary: 148 mg/dL — ABNORMAL HIGH (ref 70–99)
Glucose-Capillary: 196 mg/dL — ABNORMAL HIGH (ref 70–99)
Glucose-Capillary: 415 mg/dL — ABNORMAL HIGH (ref 70–99)
Glucose-Capillary: 286 mg/dL — ABNORMAL HIGH (ref 70–99)

## 2020-10-09 MED ORDER — METOPROLOL TARTRATE 5 MG/5ML IV SOLN
INTRAVENOUS | Status: AC
  Administered 2020-10-09: 5 mg
  Filled 2020-10-09: qty 5

## 2020-10-09 MED ORDER — METOPROLOL TARTRATE 5 MG/5ML IV SOLN: 5.0000 mg | Freq: Once | INTRAVENOUS | Status: AC

## 2020-10-09 NOTE — Progress Notes (Addendum)
Currently Pt O2 on at HFNC 50%, 30L and on NRB per RT.    Pt sats earlier today at 1340 @ 86% on the HFNC 40%, RT increased 50%.  HFNC popped off, and O2 sat decreased to 70%'s. Placed back on  HFNC, sats up low 80%'s, RT was  notified and RT placed on NRB with the HFNC.  at approx 1552 pt given po dilaudid 1mg  for "air hunger" per prn order RR 28-30'S.     At this time, RT back to see pt and keeping pt on HFNC 50% and NRB mask. HR 120, and RR 30-50. RT states she's working hard on breathing.  Prn med already given. Nurse will contact MD.   Message sent to Dr [place] with the above information,with call back at 1715 and orders for metoprolol and CXR.

## 2020-10-09 NOTE — TOC Progression Note (Addendum)
Transition of Care [facility]) - Progression Note    Patient Details  Name: [name] [name] MRN: [id number] Date of Birth: 06-Jan-1956  Transition of Care [facility]) CM/SW [place], RN Phone Number: 10/09/2020, 11:16 AM  Clinical Narrative:    Case management spoke with [name] [name], CM with [facility] and the facility will not have an available LTAC bed until tomorrow.  I updated the patient at the bedside to look forward to probable transfer tomorrow.  10/09/2020 - CM and MSW spoke with the patient's daughter, [name] [name] and updated her on the pending transfer tomorrow.   Expected Discharge Plan: Long Term Acute Care (LTAC) Barriers to Discharge: Continued Medical Work up  Expected Discharge Plan and Services Expected Discharge Plan: [place] (LTAC) In-house Referral: Clinical Social Work Discharge Planning Services: CM Consult Post Acute Care Choice: [place] arrangements for the past 2 months: Single Family Home                                       Social Determinants of Health (SDOH) Interventions    Readmission Risk Interventions Readmission Risk Prevention Plan 09/19/2020  Transportation Screening Complete  Medication Review Press photographer) Complete  PCP or Specialist appointment within 3-5 days of discharge Complete  HRI or Home Care Consult Complete  SW Recovery Care/Counseling Consult Complete  Palliative Care Screening Complete  Skilled Nursing Facility Complete  Some recent data might be hidden

## 2020-10-09 NOTE — Progress Notes (Signed)
Pt HR down to 102, with RR of 23 and sat 90% after metoprolol IV given. Dr [place] notified.  Also informed MD of CBG 415, inquired about amount of sliding scale novolog needed to be given, ordered to give only 20 units. MD informed pt had been eating grapes earlier that may have contributed to her increase in glucose, also pt had taken off the NRB to eat the grapes.

## 2020-10-09 NOTE — Progress Notes (Signed)
[facility] PROGRESS NOTE  [name] [name] ALP:[id number] DOB: [birth year] DOA: 08/18/2020 PCP: Glendale Chard, MD  Status: Remains inpatient appropriate because:Unsafe d/c plan, IV treatments appropriate due to intensity of illness or inability to take PO and Inpatient level of care appropriate due to severity of illness   Dispo:  Patient From:  HOME  Planned Disposition:  LTAC-3/1 insurance authorization obtained.  No beds available noting to people in line ahead of patient.  Expected discharge date: 10/10/2020 Medically stable for discharge:  NO-LTAC  Difficult to place: Yes; Barriers to DC: Continues to require very high levels of oxygen secondary to Covid lung injury.  She is physically deconditioned and requires SNF for rehabilitative therapies but given high level of oxygen requirements not a candidate for SNF at this time. 2/28 plan is to dc to LTAC once insurance auth obtained   Level of care: Progressive  Code Status: DNR  Family Communication: Patient; daughter Mendel Ryder 3/2 by [facility] to update on plan dc to [facility] once bed available.   DVT prophylaxis: Lovenox Vaccination status: Fully vaccinated against Covid prior to admission.  Initial vaccine November 2021 with follow-up vaccines March and April 2021  Foley catheter: No  HPI: 14 old female with history of renal transplant x2 on chronic immunosuppression agents, diabetes, hypothyroidism who presented with shortness of breath initially.  She was found to be in acute hypoxic respiratory failure due to Covid pneumonia.  Hospital course was prolonged and was complicated by slow improvement from severe hypoxia, AKI and non-STEMI.  Continues to require high flow oxygen.  Patient was a DNR until 2/10 at which point she changed back to full code. Further discussions held w/ pt and dtr and code status changed back to DNR on 2/12   Subjective: Awake.  Sitting up in bed.  Note minimal tachycardia on telemetry with O2 sats 89%  and respiratory rate 40.  Patient denies difficulty breathing.  States she feels well since FiO2 decreased to 40%.  Objective: Vitals:   10/09/20 0728 10/09/20 0800  BP:  134/69  Pulse:    Resp:  20  Temp:  98.2 F (36.8 C)  SpO2: 90% 90%    Intake/Output Summary (Last 24 hours) at 10/09/2020 1019 Last data filed at 10/08/2020 1651 Gross per 24 hour  Intake --  Output 500 ml  Net -500 ml   Filed Weights   10/01/20 0500 10/02/20 0500 10/07/20 0500  Weight: 86.3 kg 85.9 kg 84.6 kg    Exam:  General: Awake, calm, noted with some increased work of breathing but patient denies respiratory distress or difficulty Respiratory: Anterior lung sounds clear, 35 L/min HHFNC 40% FiO2 with O2 sats is 82 to 90%-baseline tachypnea and some increased work of breathing at rest. Cardiovascular: Heart sounds are normal, no JVD or peripheral edema, tachycardic with ventricular rates between 105 and 117 bpm Abdomen: Abdomen soft, bowel sounds present.  Normoactive bowel sounds.  Variable oral intake.  LBM 2/28 Neurologic: CN 2-12 grossly intact. Sensation intact, DTR normal. Strength 3-4/5 x all 4 extremities.  Psychiatric: Alert and oriented x3.  Pleasant affect.  Reports disappointment at not being able to discharge to [facility] today   Assessment/Plan: Acute problems: Sepsis /acute on chronic hypoxic respiratory failure due to Covid pneumonia/new hospital associated pneumonia as of 2/14:  Fully vaccinated patient.  Immunocompromised status due to renal transplant, immunosuppressive medications.   Presented with multifocal pneumonia from Covid.  Completed treatment with IV steroids, baricitinib, remdesivir.  Also was treated with IV  antibiotics for superimposed bacterial pneumonia.   Recent CT of the chest demonstrates progressive pulmonary fibrosis secondary to COVID.   FiO2 40% patient thus far tolerating  Continue pulmonary toileting, incentive spirometer/flutter valve.   Continue scheduled Ativan  every 12 hours and Dilaudid 1 mg PO every 4 hours prn for air hunger and associated anxiety Completed 7 days of cefepime  Continue prednisone to 50 mg BID-no taper ust yet since able to slowly wean Prednisone on higher dose-home dose 5 mg daily  DKA/diabetes mellitus on insulin with hyperglycemia DKA resolved.  No longer on insulin drip.   Continue Levemir to 25 units given- cont resistant SSI  Continue Novolog MC 2 units  CBGs have improved with adjustments in long-acting insulin and are now running between 88 and 216 Hemoglobin A1c of 8.2 as per 1/11.   2/23 CBG at 8 AM had decreased to 45 2/2 missed doses of IV Solumedrol-have changed to Prednisone  AKI on CKD stage IV/transplant kidney:  Transplant x2.  AKI was most likely hemodynamically mediated.   Creatinine has remained acutely around 1.7.   Nephrology has signed off  Continue prednisone, Prograf and Myfortic  Avoid nephrotoxic medications  Chronic normocytic anemia:  Most likely associated  with CKD.  No signs of bleeding.   Status post 1 unit of PRBC transfusion on 09/11/2020.   Hemoglobin 11.8 on 2/21  Deconditioning/debility:  PT/OT recommended skilled nursing facility on discharge but current O2 requirements are too high to facilitate discharge to skilled nursing facility. LTAC reviewed chart and now appropriate for [facility]  Stage I decubitus coccyx Pressure Injury 08/20/20 Coccyx Mid Stage 1 -  Intact skin with non-blanchable redness of a localized area usually over a bony prominence. (Active)  Date First Assessed/Time First Assessed: 08/20/20 0800   Location: Coccyx  Location Orientation: Mid  Staging: Stage 1 -  Intact skin with non-blanchable redness of a localized area usually over a bony prominence.  Present on Admission: Yes    Assessments 08/20/2020  9:00 AM 10/08/2020  8:36 PM  Dressing Type Foam - Lift dressing to assess site every shift Foam - Lift dressing to assess site every shift  Dressing Changed Changed   Dressing Change Frequency Every 3 days PRN  Site / Wound Assessment Clean;Dry;Pink --  Peri-wound Assessment -- Intact  Wound Length (cm) 5 cm --  Wound Width (cm) 0.1 cm --  Wound Depth (cm) 0 cm --  Wound Surface Area (cm^2) 0.5 cm^2 --  Wound Volume (cm^3) 0 cm^3 --  Drainage Amount None None  Treatment Cleansed;Off loading Cleansed     No Linked orders to display     Wound / Incision (Open or Dehisced) 08/31/20 Puncture Abdomen Left;Lower From Heparin SQ injections. (Active)  Date First Assessed/Time First Assessed: 08/31/20 2000   Wound Type: Puncture  Location: Abdomen  Location Orientation: Left;Lower  Wound Description (Comments): From Heparin SQ injections.  Present on Admission: No    Assessments 08/31/2020  8:00 PM 10/08/2020 10:00 AM  Wound Length (cm) 0 cm 0 cm  Wound Width (cm) 0 cm 0 cm  Wound Depth (cm) 0 cm 0 cm  Wound Volume (cm^3) 0 cm^3 0 cm^3  Wound Surface Area (cm^2) 0 cm^2 0 cm^2     No Linked orders to display       Other problems: Transaminitis:  Resolved likely secondary to COVID meds as well as hypoperfusion  Right upper quadrant ultrasound is nonacute and showed some sludge/gallstones but no signs of cholecystitis.  Hepatitis panel negative.    Yeast vaginitis -Likely secondary to recent antibiotics, steroids and resultant -Began intravaginal antifungals on 2/21 x 3 days  NSTEMI:  Patient with chest pain on 1/23 with EKG changes.   Treated with Nitroglycerin, morphine and beta-blocker.   Echo is reassuring with preserved ejection fraction.   Continue aspirin/statin completed 48 hours of IV heparin.   Cardiology signed off.   Sinus pause: Happened on  1/20 with 4.5 seconds pause.   Beta-blocker discontinued.  Currently stable.  Thrombocytopenia:  Most likely associated with acute illness.  Continue to monitor,stable  Complicated UTI:  Completed course of Rocephin.  Hypothyroidism:  Continue Synthyroid.   Repeat TFT in 3  months  Hypertension:  Currently blood pressure stable.  Continue medications  Hyperlipidemia:  Statin held due to elevated LFTs.   LFTs have normalized so can consider resumption of statin although oral intake remains variable  Obesity:  BMI 33.9  Nutrition Status: Estimated body mass index is 34.11 kg/m as calculated from the following:   Height as of this encounter: 5\' 2"  (1.575 m).   Weight as of this encounter: 84.6 kg.  Poor oral intake 2/2 air hunger          Data Reviewed: Basic Metabolic Panel: Recent Labs  Lab 10/05/20 0054  NA 136  K 5.1  CL 108  CO2 20*  GLUCOSE 264*  BUN 42*  CREATININE 1.57*  CALCIUM 9.5  MG 1.9  PHOS 3.1   Liver Function Tests: No results for input(s): AST, ALT, ALKPHOS, BILITOT, PROT, ALBUMIN in the last 168 hours. No results for input(s): LIPASE, AMYLASE in the last 168 hours. No results for input(s): AMMONIA in the last 168 hours. CBC: Recent Labs  Lab 10/05/20 0054  WBC 11.5*  HGB 12.0  HCT 35.8*  MCV 83.4  PLT 119*   Cardiac Enzymes: No results for input(s): CKTOTAL, CKMB, CKMBINDEX, TROPONINI in the last 168 hours. BNP (last 3 results) Recent Labs    09/11/20 0155 09/12/20 0113 09/15/20 0028  BNP 86.8 80.1 80.0    ProBNP (last 3 results) No results for input(s): PROBNP in the last 8760 hours.  CBG: Recent Labs  Lab 10/08/20 0810 10/08/20 1217 10/08/20 1615 10/08/20 2109 10/09/20 0718  GLUCAP 87 88 216* 182* 148*    No results found for this or any previous visit (from the past 240 hour(s)).   Studies: No results found.  Scheduled Meds: . amLODipine  10 mg Oral Daily  . vitamin C  500 mg Oral Daily  . aspirin EC  81 mg Oral Daily  . enoxaparin (LOVENOX) injection  30 mg Subcutaneous Q24H  . feeding supplement (NEPRO CARB STEADY)  237 mL Oral BID BM  . fluticasone  1 spray Each Nare Daily  . folic acid  1 mg Oral Daily  . hydrALAZINE  50 mg Oral Q8H  . insulin aspart  0-20 Units  Subcutaneous TID WC  . insulin aspart  2 Units Subcutaneous TID WC  . insulin detemir  25 Units Subcutaneous BID  . ipratropium-albuterol  3 mL Nebulization TID  . isosorbide mononitrate  60 mg Oral Daily  . levothyroxine  50 mcg Oral Q0600  . LORazepam  1 mg Oral BID  . mouth rinse  15 mL Mouth Rinse BID  . mycophenolate  360 mg Oral BID  . pantoprazole  40 mg Oral BID  . predniSONE  50 mg Oral BID WC  . tacrolimus  3 mg Oral QPM  .  tacrolimus  4 mg Oral Daily  . zinc sulfate  220 mg Oral Daily   Continuous Infusions: . sodium chloride    . sodium chloride Stopped (08/31/20 1545)    Principal Problem:   ARF (acute renal failure) (HCC) Active Problems:   Complicated urinary tract infection   AKI (acute kidney injury) ([place])   Fever, unspecified   Renal transplant recipient   Immunocompromised ([place])   DM (diabetes mellitus), type 2 with renal complications ([place])   Essential hypertension with goal blood pressure less than 140/90   HLD (hyperlipidemia)   Acute hypoxemic respiratory failure due to COVID-19 ([place])   Respiratory failure (HCC)   Acute hyperkalemia   Diabetic ketoacidosis without coma associated with type 1 diabetes mellitus ([place])   Acute and chronic respiratory failure with hypoxia ([place])   HCAP (healthcare-associated pneumonia)   Pulmonary fibrosis, postinflammatory (HCC)   Transaminitis   Controlled type 2 diabetes mellitus with hyperglycemia, with long-term current use of insulin (HCC)   Decubitus ulcer of coccygeal region, stage 1   Normocytic anemia   Physical deconditioning   NSTEMI (non-ST elevated myocardial infarction) ([place])   Moderate protein-calorie malnutrition ([place])   Consultants:  None  Procedures:  Echocardiogram  Antibiotics: Anti-infectives (From admission, onward)   Start     Dose/Rate Route Frequency Ordered Stop   09/24/20 1200  vancomycin (VANCOREADY) IVPB 1000 mg/200 mL  Status:  Discontinued        1,000 mg 200 mL/hr over 60  Minutes Intravenous Every 48 hours 09/22/20 1036 09/24/20 1013   09/22/20 1130  vancomycin (VANCOREADY) IVPB 1750 mg/350 mL        1,750 mg 175 mL/hr over 120 Minutes Intravenous  Once 09/22/20 1036 09/22/20 1735   09/22/20 1130  ceFEPIme (MAXIPIME) 2 g in sodium chloride 0.9 % 100 mL IVPB        2 g 200 mL/hr over 30 Minutes Intravenous Every 24 hours 09/22/20 1036 09/28/20 0052   09/07/20 1030  vancomycin (VANCOCIN) IVPB 1000 mg/200 mL premix  Status:  Discontinued        1,000 mg 200 mL/hr over 60 Minutes Intravenous Every 48 hours 09/05/20 0933 09/07/20 0951   09/05/20 1030  vancomycin (VANCOREADY) IVPB 2000 mg/400 mL        2,000 mg 200 mL/hr over 120 Minutes Intravenous  Once 09/05/20 0933 09/05/20 2018   09/05/20 1030  ceFEPIme (MAXIPIME) 2 g in sodium chloride 0.9 % 100 mL IVPB  Status:  Discontinued        2 g 200 mL/hr over 30 Minutes Intravenous Every 24 hours 09/05/20 0933 09/07/20 0951   08/20/20 1445  cefTRIAXone (ROCEPHIN) 1 g in sodium chloride 0.9 % 100 mL IVPB        1 g 200 mL/hr over 30 Minutes Intravenous Every 24 hours 08/20/20 1348 08/26/20 2147   08/20/20 1000  remdesivir 100 mg in sodium chloride 0.9 % 100 mL IVPB       "Followed by" Linked Group Details   100 mg 200 mL/hr over 30 Minutes Intravenous Daily 08/19/20 0021 08/23/20 1113   08/19/20 0130  remdesivir 200 mg in sodium chloride 0.9% 250 mL IVPB       "Followed by" Linked Group Details   200 mg 580 mL/hr over 30 Minutes Intravenous Once 08/19/20 0021 08/19/20 0314       Time spent: 20 minutes    Erin Hearing ANP  [facility] 7 am - 330 pm/M-F for direct patient care and  secure chat Please refer to Amion for contact info 51  days

## 2020-10-10 ENCOUNTER — Ambulatory Visit: Payer: HMO | Admitting: Podiatry

## 2020-10-10 DIAGNOSIS — R5381 Other malaise: Secondary | ICD-10-CM

## 2020-10-10 DIAGNOSIS — J9621 Acute and chronic respiratory failure with hypoxia: Secondary | ICD-10-CM | POA: Diagnosis not present

## 2020-10-10 DIAGNOSIS — E440 Moderate protein-calorie malnutrition: Secondary | ICD-10-CM

## 2020-10-10 DIAGNOSIS — D849 Immunodeficiency, unspecified: Secondary | ICD-10-CM | POA: Diagnosis not present

## 2020-10-10 DIAGNOSIS — I10 Essential (primary) hypertension: Secondary | ICD-10-CM | POA: Diagnosis not present

## 2020-10-10 DIAGNOSIS — U071 COVID-19: Secondary | ICD-10-CM | POA: Diagnosis not present

## 2020-10-10 LAB — GLUCOSE, CAPILLARY
Glucose-Capillary: 197 mg/dL — ABNORMAL HIGH (ref 70–99)
Glucose-Capillary: 247 mg/dL — ABNORMAL HIGH (ref 70–99)
Glucose-Capillary: 333 mg/dL — ABNORMAL HIGH (ref 70–99)

## 2020-10-10 NOTE — Progress Notes (Signed)
Physical Therapy Treatment Patient Details Name: [name] [name] MRN: [id number] DOB: 1956/02/04 Today's Date: 10/10/2020    History of Present Illness Pt is a 65 y.o. female who tested (+) COVID-19 on 08/13/20, now admitted 08/18/20 with worsening SOB, cough, chest pain and body aches. Acute hypoxic respiratory failure due to COVID-19 PNA, DKA, AKI on CKD, UTI. CP on 1/22 with NSTEMI . PMHx: HTN, DM2, chronic steroid use, s/p renal transplant x2.    PT Comments    Pt on arrival on 50% FiO2/ 30L with SpO2 90%. Pt with quick desaturation with all mobility and increased FiO2 to 70% for mobility with drop to 82% EOB and 6 min to recover to 88% with HR 121. On 70% pt with drop to 73% with pivot to chair and 3 min to recover to 85% with HR 111 and RR 42. During HEP FiO2 60% with SPO2 83-88% RR 35, HR 118. Pt with 3 min recovery after HEP on 60% before able to return to 50% FiO2 at rest with 88% SpO2.  Pt continues to be very motivated to move but desaturates quickly and requires increased time and FIO2 to safely mobilize.     Follow Up Recommendations  Supervision/Assistance - 24 hour;LTACH     Equipment Recommendations  3in1 (PT);Hospital bed;Other (comment);Wheelchair (measurements PT) (rollator)    Recommendations for Other Services       Precautions / Restrictions Precautions Precautions: Fall Precaution Comments: watch SpO2 quick to desaturate with HHFNC    Mobility  Bed Mobility Overal bed mobility: Needs Assistance Bed Mobility: Rolling Rolling: Supervision   Supine to sit: Min assist     General bed mobility comments: supervision for rolling for pericare, min assist to elevate trunk to EOB    Transfers Overall transfer level: Needs assistance   Transfers: Sit to/from Stand Sit to Stand: Min assist;+2 physical assistance Stand pivot transfers: Mod assist;+2 physical assistance       General transfer comment: min assist with right knee blocked and pt pulling up  on supported RW to stand from bed. Mod +2 with RW to pivot from bed to chair with improved stepping and stability of RLE today  Ambulation/Gait             General Gait Details: unable   Stairs             Wheelchair Mobility    Modified Rankin (Stroke Patients Only)       Balance Overall balance assessment: Needs assistance   Sitting balance-Leahy Scale: Fair Sitting balance - Comments: EOB with UE support and guarding   Standing balance support: Bilateral upper extremity supported Standing balance-Leahy Scale: Poor Standing balance comment: bil UE support on RW in standing                            Cognition Arousal/Alertness: Awake/alert Behavior During Therapy: Flat affect Overall Cognitive Status: Impaired/Different from baseline Area of Impairment: Safety/judgement;Awareness                       Following Commands: Follows one step commands consistently Safety/Judgement: Decreased awareness of deficits;Decreased awareness of safety   Problem Solving: Slow processing General Comments: decreased awareness of true medical complexities      Exercises General Exercises - Lower Extremity Long Arc Quad: AROM;Both;Seated;10 reps Hip Flexion/Marching: AROM;Both;Seated;10 reps    General Comments        Pertinent Vitals/Pain Pain Assessment: No/denies pain  Home Living                      Prior Function            PT Goals (current goals can now be found in the care plan section) Progress towards PT goals: Progressing toward goals    Frequency    Min 2X/week      PT Plan Current plan remains appropriate    Co-evaluation              AM-PAC PT "6 Clicks" Mobility   Outcome Measure  Help needed turning from your back to your side while in a flat bed without using bedrails?: A Little Help needed moving from lying on your back to sitting on the side of a flat bed without using bedrails?: A  Little Help needed moving to and from a bed to a chair (including a wheelchair)?: A Lot Help needed standing up from a chair using your arms (e.g., wheelchair or bedside chair)?: A Little Help needed to walk in hospital room?: Total Help needed climbing 3-5 steps with a railing? : Total 6 Click Score: 13    End of Session Equipment Utilized During Treatment: Oxygen;Gait belt Activity Tolerance: Patient tolerated treatment well Patient left: in chair;with call bell/phone within reach;with chair alarm set Nurse Communication: Mobility status;Precautions PT Visit Diagnosis: Other abnormalities of gait and mobility (R26.89);Muscle weakness (generalized) (M62.81)     Time: 1000-1029 PT Time Calculation (min) (ACUTE ONLY): 29 min  Charges:  $Therapeutic Exercise: 8-22 mins $Therapeutic Activity: 8-22 mins                     Olympia Adelsberger P, PT Acute Rehabilitation Services Pager: [phone] Office: [place] Ad[facility] Nie[place]as 10/10/2020, 11:12 AM

## 2020-10-10 NOTE — TOC Progression Note (Signed)
Transition of Care [facility]) - Progression Note    Patient Details  Name: Akaila Rambo MRN: [id number] Date of Birth: Dec 15, 1955  Transition of Care [facility]) CM/SW [place], RN Phone Number: 10/10/2020, 12:44 PM  Clinical Narrative:    Case management spoke with Arley Phenix, CM with [facility] and she was unable to provide an admission bed today for admission.  She assured me that the facility should have an open admission bed this weekend.  The patient's insurance authorization is approved through 10/12/2020 and Arley Phenix, CM is aware.  The patient has an updated DNR form signed in the chart and a pending discharge summary by Erin Hearing, NP that will need to be reviewed and signed prior to transfer to [facility] this weekend when a bed becomes available.  CM will continue to follow the patient for admission to LTAC.  Expected Discharge Plan: Long Term Acute Care (LTAC) Barriers to Discharge: Continued Medical Work up  Expected Discharge Plan and Services Expected Discharge Plan: [place] (LTAC) In-house Referral: Clinical Social Work Discharge Planning Services: CM Consult Post Acute Care Choice: [place] arrangements for the past 2 months: Single Family Home                                       Social Determinants of Health (SDOH) Interventions    Readmission Risk Interventions Readmission Risk Prevention Plan 09/19/2020  Transportation Screening Complete  Medication Review [employer]) Complete  PCP or Specialist appointment within 3-5 days of discharge Complete  HRI or Home Care Consult Complete  SW Recovery Care/Counseling Consult Complete  Palliative Care Screening Complete  Skilled Nursing Facility Complete  Some recent data might be hidden

## 2020-10-10 NOTE — Progress Notes (Signed)
Attempted to call family back for update at number left with secretary with no answer.

## 2020-10-10 NOTE — Progress Notes (Addendum)
[facility] PROGRESS NOTE  [name] [name] GYI:[id number] DOB: 05-19-1956 DOA: 08/18/2020 PCP: Glendale Chard, MD  Status: Remains inpatient appropriate because:Unsafe d/c plan, IV treatments appropriate due to intensity of illness or inability to take PO and Inpatient level of care appropriate due to severity of illness   Dispo:  Patient From:  HOME  Planned Disposition:  LTAC-3/1 insurance authorization obtained.  No beds available noting to people in line ahead of patient.  Expected discharge date: 10/10/2020 Medically stable for discharge:  NO-LTAC  Difficult to place: Yes; Barriers to DC: Continues to require very high levels of oxygen secondary to Covid lung injury.  She is physically deconditioned and requires SNF for rehabilitative therapies but given high level of oxygen requirements not a candidate for SNF at this time. 2/28 plan is to dc to LTAC once insurance auth obtained   Level of care: Progressive  Code Status: DNR  Family Communication: Patient; daughter Mendel Ryder 3/2 by [facility] to update on plan [facility] to [facility] once [facility] available.   DVT prophylaxis: Lovenox Vaccination status: Fully vaccinated against Covid prior to admission.  Initial vaccine November 2021 with follow-up vaccines March and April 2021  Foley catheter: No  HPI: 18 old female with history of renal transplant x2 on chronic immunosuppression agents, diabetes, hypothyroidism who presented with shortness of breath initially.  She was found to be in acute hypoxic respiratory failure due to Covid pneumonia.  Hospital course was prolonged and was complicated by slow improvement from severe hypoxia, AKI and non-STEMI.  Continues to require high flow oxygen.  Patient was a DNR until 2/10 at which point she changed back to full code. Further discussions held w/ pt and dtr and code status changed back to DNR on 2/12   Subjective: Sitting up in bed with increased work of breathing.  Stated had just finished  eating and was short winded.  Reports that she has completed all paperwork as requested by LTAC.  Objective: Vitals:   10/10/20 0800 10/10/20 0821  BP: (!) 157/63   Pulse: (!) 102   Resp: 20   Temp: 98.2 F (36.8 C)   SpO2: 91% (!) 89%    Intake/Output Summary (Last 24 hours) at 10/10/2020 1013 Last data filed at 10/09/2020 1907 Gross per 24 hour  Intake --  Output 500 ml  Net -500 ml   Filed Weights   10/01/20 0500 10/02/20 0500 10/07/20 0500  Weight: 86.3 kg 85.9 kg 84.6 kg    Exam:  General: Awake, mild baseline respiratory distress but improved as compared to yesterday, calm Respiratory: Lungs are clear.  30 L/min HHFNC 50% FiO2 with O2 sats is 82 to 91%-improved tachypnea as compared to yesterday on 40%, no cough Cardiovascular: Normal heart sounds, no JVD, no peripheral edema, continues with mild resting tachycardia with heart rate between 102 and 113 bpm, normotensive Abdomen: Soft nontender nondistended.  Variable oral intake.  LBM 3/2 Neurologic: CN 2-12 grossly intact. Sensation intact, DTR normal. Strength 3-4/5 x all 4 extremities.  Psychiatric: Alert and oriented x3.  Pleasant affect.   Assessment/Plan: Acute problems: Sepsis /acute on chronic hypoxic respiratory failure due to Covid pneumonia/new hospital associated pneumonia as of 2/14:  Fully vaccinated patient.  Immunocompromised status due to renal transplant, immunosuppressive medications.   Presented with multifocal pneumonia from Covid.  Completed treatment with IV steroids, baricitinib, remdesivir.  Also was treated with IV antibiotics for superimposed bacterial pneumonia.   Recent CT of the chest demonstrates progressive pulmonary fibrosis secondary to COVID.  Yesterday was on FiO2 40% initially tolerated but eventually had persistent shortness of breath, tachypnea and tachycardia with low O2 saturations.  Symptoms have improved after increasing FiO2 back up to 50%. Follow-up chest x-ray on 3/3 continues  to demonstrate multifocal infiltrates Continue pulmonary toileting, incentive spirometer/flutter valve.   Continue scheduled Ativan every 12 hours and Dilaudid 1 mg PO every 4 hours prn for air hunger and associated anxiety Completed 7 days of cefepime  Continue prednisone to 50 mg BID-no taper ust yet since able to slowly wean Prednisone on higher dose-home dose 5 mg daily  DKA/diabetes mellitus on insulin with hyperglycemia DKA resolved.  No longer on insulin drip.   Continue Levemir to 25 units given- cont resistant SSI  Continue Novolog MC 2 units  CBGs did increase to greater than 400 on 3/3 after patient ingested grapes.  We will continue to follow Hemoglobin A1c of 8.2 as per 1/11.   2/23 CBG at 8 AM had decreased to 45 2/2 missed doses of IV Solumedrol-have changed to Prednisone  AKI on CKD stage IV/transplant kidney:  Transplant x2.  AKI was most likely hemodynamically mediated.   Creatinine has remained acutely around 1.7.   Nephrology has signed off  Continue prednisone, Prograf and Myfortic  Avoid nephrotoxic medications  Chronic normocytic anemia:  Most likely associated  with CKD.  No signs of bleeding.   Status post 1 unit of PRBC transfusion on 09/11/2020.   Hemoglobin 11.8 on 2/21  Deconditioning/debility:  PT/OT recommended skilled nursing facility on discharge but current O2 requirements are too high to facilitate discharge to skilled nursing facility. LTAC reviewed chart and now appropriate for [facility]  Stage I decubitus coccyx Pressure Injury 08/20/20 Coccyx Mid Stage 1 -  Intact skin with non-blanchable redness of a localized area usually over a bony prominence. (Active)  Date First Assessed/Time First Assessed: 08/20/20 0800   Location: Coccyx  Location Orientation: Mid  Staging: Stage 1 -  Intact skin with non-blanchable redness of a localized area usually over a bony prominence.  Present on Admission: Yes    Assessments 08/20/2020  9:00 AM 10/09/2020  8:04 PM   Dressing Type Foam - Lift dressing to assess site every shift Foam - Lift dressing to assess site every shift  Dressing Changed Clean;Dry;Intact  Dressing Change Frequency Every 3 days PRN  Site / Wound Assessment Clean;Dry;Pink --  Wound Length (cm) 5 cm --  Wound Width (cm) 0.1 cm --  Wound Depth (cm) 0 cm --  Wound Surface Area (cm^2) 0.5 cm^2 --  Wound Volume (cm^3) 0 cm^3 --  Drainage Amount None None  Treatment Cleansed;Off loading --     No Linked orders to display     Wound / Incision (Open or Dehisced) 08/31/20 Puncture Abdomen Left;Lower From Heparin SQ injections. (Active)  Date First Assessed/Time First Assessed: 08/31/20 2000   Wound Type: Puncture  Location: Abdomen  Location Orientation: Left;Lower  Wound Description (Comments): From Heparin SQ injections.  Present on Admission: No    Assessments 08/31/2020  8:00 PM 10/08/2020 10:00 AM  Wound Length (cm) 0 cm 0 cm  Wound Width (cm) 0 cm 0 cm  Wound Depth (cm) 0 cm 0 cm  Wound Volume (cm^3) 0 cm^3 0 cm^3  Wound Surface Area (cm^2) 0 cm^2 0 cm^2     No Linked orders to display       Other problems: Transaminitis:  Resolved likely secondary to COVID meds as well as hypoperfusion  Right upper quadrant  ultrasound is nonacute and showed some sludge/gallstones but no signs of cholecystitis.   Hepatitis panel negative.    Yeast vaginitis -Likely secondary to recent antibiotics, steroids and resultant -Began intravaginal antifungals on 2/21 x 3 days  NSTEMI:  Patient with chest pain on 1/23 with EKG changes.   Treated with Nitroglycerin, morphine and beta-blocker.   Echo is reassuring with preserved ejection fraction.   Continue aspirin/statin completed 48 hours of IV heparin.   Cardiology signed off.   Sinus pause: Happened on  1/20 with 4.5 seconds pause.   Beta-blocker discontinued.  Currently stable.  Thrombocytopenia:  Most likely associated with acute illness.  Continue to monitor,stable  Complicated  UTI:  Completed course of Rocephin.  Hypothyroidism:  Continue Synthyroid.   Repeat TFT in 3 months  Hypertension:  Currently blood pressure stable.  Continue medications  Hyperlipidemia:  Statin held due to elevated LFTs.   LFTs have normalized so can consider resumption of statin although oral intake remains variable  Obesity:  BMI 33.9  Nutrition Status: Estimated body mass index is 34.11 kg/m as calculated from the following:   Height as of this encounter: 5\' 2"  (1.575 m).   Weight as of this encounter: 84.6 kg.  Poor oral intake 2/2 air hunger          Data Reviewed: Basic Metabolic Panel: Recent Labs  Lab 10/05/20 0054  NA 136  K 5.1  CL 108  CO2 20*  GLUCOSE 264*  BUN 42*  CREATININE 1.57*  CALCIUM 9.5  MG 1.9  PHOS 3.1   Liver Function Tests: No results for input(s): AST, ALT, ALKPHOS, BILITOT, PROT, ALBUMIN in the last 168 hours. No results for input(s): LIPASE, AMYLASE in the last 168 hours. No results for input(s): AMMONIA in the last 168 hours. CBC: Recent Labs  Lab 10/05/20 0054  WBC 11.5*  HGB 12.0  HCT 35.8*  MCV 83.4  PLT 119*   Cardiac Enzymes: No results for input(s): CKTOTAL, CKMB, CKMBINDEX, TROPONINI in the last 168 hours. BNP (last 3 results) Recent Labs    09/11/20 0155 09/12/20 0113 09/15/20 0028  BNP 86.8 80.1 80.0    ProBNP (last 3 results) No results for input(s): PROBNP in the last 8760 hours.  CBG: Recent Labs  Lab 10/09/20 0718 10/09/20 1157 10/09/20 1621 10/09/20 2105 10/10/20 0724  GLUCAP 148* 196* 415* 286* 197*    No results found for this or any previous visit (from the past 240 hour(s)).   Studies: DG CHEST PORT 1 VIEW  Result Date: 10/09/2020 CLINICAL DATA:  Shortness of breath EXAM: PORTABLE CHEST 1 VIEW COMPARISON:  September 29, 2020 FINDINGS: The heart size and mediastinal contours are within normal limits. Interval slight worsening in the fluffy patchy airspace opacities throughout  both lungs. Probable trace bilateral pleural effusions are seen. No acute osseous abnormality. IMPRESSION: Interval slight worsening in the airspace opacities which could be due to multifocal pneumonia Electronically Signed   By: Prudencio Pair M.D.   On: 10/09/2020 17:50    Scheduled Meds: . amLODipine  10 mg Oral Daily  . vitamin C  500 mg Oral Daily  . aspirin EC  81 mg Oral Daily  . enoxaparin (LOVENOX) injection  30 mg Subcutaneous Q24H  . feeding supplement (NEPRO CARB STEADY)  237 mL Oral BID BM  . fluticasone  1 spray Each Nare Daily  . folic acid  1 mg Oral Daily  . hydrALAZINE  50 mg Oral Q8H  . insulin aspart  0-20 Units Subcutaneous TID WC  . insulin aspart  2 Units Subcutaneous TID WC  . insulin detemir  25 Units Subcutaneous BID  . ipratropium-albuterol  3 mL Nebulization TID  . isosorbide mononitrate  60 mg Oral Daily  . levothyroxine  50 mcg Oral Q0600  . LORazepam  1 mg Oral BID  . mouth rinse  15 mL Mouth Rinse BID  . mycophenolate  360 mg Oral BID  . pantoprazole  40 mg Oral BID  . predniSONE  50 mg Oral BID WC  . tacrolimus  3 mg Oral QPM  . tacrolimus  4 mg Oral Daily  . zinc sulfate  220 mg Oral Daily   Continuous Infusions: . sodium chloride    . sodium chloride Stopped (08/31/20 1545)    Principal Problem:   ARF (acute renal failure) (HCC) Active Problems:   Complicated urinary tract infection   AKI (acute kidney injury) (HCC)   Fever, unspecified   Renal transplant recipient   Immunocompromised ([place])   DM (diabetes mellitus), type 2 with renal complications ([place])   Essential hypertension with goal blood pressure less than 140/90   HLD (hyperlipidemia)   Acute hypoxemic respiratory failure due to COVID-19 ([place])   Respiratory failure (HCC)   Acute hyperkalemia   Diabetic ketoacidosis without coma associated with type 1 diabetes mellitus ([place])   Acute and chronic respiratory failure with hypoxia (HCC)   HCAP (healthcare-associated pneumonia)    Pulmonary fibrosis, postinflammatory (HCC)   Transaminitis   Controlled type 2 diabetes mellitus with hyperglycemia, with long-term current use of insulin (HCC)   Decubitus ulcer of coccygeal region, stage 1   Normocytic anemia   Physical deconditioning   NSTEMI (non-ST elevated myocardial infarction) ([place])   Moderate protein-calorie malnutrition ([place])   Uncontrolled type 2 diabetes mellitus with hyperglycemia, with long-term current use of insulin ([place])   Consultants:  None  Procedures:  Echocardiogram  Antibiotics: Anti-infectives (From admission, onward)   Start     Dose/Rate Route Frequency Ordered Stop   09/24/20 1200  vancomycin (VANCOREADY) IVPB 1000 mg/200 mL  Status:  Discontinued        1,000 mg 200 mL/hr over 60 Minutes Intravenous Every 48 hours 09/22/20 1036 09/24/20 1013   09/22/20 1130  vancomycin (VANCOREADY) IVPB 1750 mg/350 mL        1,750 mg 175 mL/hr over 120 Minutes Intravenous  Once 09/22/20 1036 09/22/20 1735   09/22/20 1130  ceFEPIme (MAXIPIME) 2 g in sodium chloride 0.9 % 100 mL IVPB        2 g 200 mL/hr over 30 Minutes Intravenous Every 24 hours 09/22/20 1036 09/28/20 0052   09/07/20 1030  vancomycin (VANCOCIN) IVPB 1000 mg/200 mL premix  Status:  Discontinued        1,000 mg 200 mL/hr over 60 Minutes Intravenous Every 48 hours 09/05/20 0933 09/07/20 0951   09/05/20 1030  vancomycin (VANCOREADY) IVPB 2000 mg/400 mL        2,000 mg 200 mL/hr over 120 Minutes Intravenous  Once 09/05/20 0933 09/05/20 2018   09/05/20 1030  ceFEPIme (MAXIPIME) 2 g in sodium chloride 0.9 % 100 mL IVPB  Status:  Discontinued        2 g 200 mL/hr over 30 Minutes Intravenous Every 24 hours 09/05/20 0933 09/07/20 0951   08/20/20 1445  cefTRIAXone (ROCEPHIN) 1 g in sodium chloride 0.9 % 100 mL IVPB        1 g 200 mL/hr over 30 Minutes Intravenous Every  24 hours 08/20/20 1348 08/26/20 2147   08/20/20 1000  remdesivir 100 mg in sodium chloride 0.9 % 100 mL IVPB       "Followed  by" Linked Group Details   100 mg 200 mL/hr over 30 Minutes Intravenous Daily 08/19/20 0021 08/23/20 1113   08/19/20 0130  remdesivir 200 mg in sodium chloride 0.9% 250 mL IVPB       "Followed by" Linked Group Details   200 mg 580 mL/hr over 30 Minutes Intravenous Once 08/19/20 0021 08/19/20 0314       Time spent: 20 minutes    Erin Hearing ANP  [facility] 7 am - 330 pm/M-F for direct patient care and secure chat Please refer to Amion for contact info 52  days

## 2020-10-10 NOTE — Progress Notes (Signed)
Occupational Therapy Treatment Patient Details Name: [name] [name] MRN: [id number] DOB: 25-Jan-1956 Today's Date: 10/10/2020    History of present illness Pt is a 65 y.o. female who tested (+) COVID-19 on 08/13/20, now admitted 08/18/20 with worsening SOB, cough, chest pain and body aches. Acute hypoxic respiratory failure due to COVID-19 PNA, DKA, AKI on CKD, UTI. CP on 1/22 with NSTEMI . PMHx: HTN, DM2, chronic steroid use, s/p renal transplant x2.   OT comments  This 65 yo female admitted with above seen today to focus on grooming tasks and Bil UE exercises. Pt needs increased time and increased O2 for all tasks due to increased work of breathing with activity. Sats stayed mostly in 31's with a couple of drops in to high 80's (30/Fio2 70) while working on grooming tasks and Bil UE exercises while seated in recliner. She will continue to benefit from acute OT with follow up at [facility].  Follow Up Recommendations  SNF;LTACH;Supervision/Assistance - 24 hour    Equipment Recommendations  3 in 1 bedside commode;Hospital bed;Wheelchair (measurements OT);Wheelchair cushion (measurements OT)       Precautions / Restrictions Precautions Precautions: Fall Precaution Comments: watch SpO2 quick to desaturate with HHFNC Restrictions Weight Bearing Restrictions: No              ADL either performed or assessed with clinical judgement   ADL Overall ADL's : Needs assistance/impaired     Grooming: Wash/dry face;Oral care;Sitting;Set up                                       Vision Patient Visual Report: No change from baseline            Cognition Arousal/Alertness: Awake/alert Behavior During Therapy: Flat affect Overall Cognitive Status: Impaired/Different from baseline Area of Impairment: Problem solving;Awareness;Safety/judgement                       Safety/Judgement: Decreased awareness of deficits;Decreased awareness of safety Awareness:  Emergent Problem Solving: Slow processing General Comments: decreased awareness of true medical complexities        Exercises Other Exercises: Pt while seated in recliner performed 10 reps of  Bil UE exercises level 1 band for shoulder extension, shoulder internal rotation, and elbow extension (increased time with pt focusing on breathing prn)           Pertinent Vitals/ Pain       Pain Assessment: No/denies pain         Frequency  Min 2X/week        Progress Toward Goals  OT Goals(current goals can now be found in the care plan section)  Progress towards OT goals: Progressing toward goals     Plan Discharge plan remains appropriate       AM-PAC OT "6 Clicks" Daily Activity     Outcome Measure   Help from another person eating meals?: None Help from another person taking care of personal grooming?: A Little Help from another person toileting, which includes using toliet, bedpan, or urinal?: A Lot Help from another person bathing (including washing, rinsing, drying)?: A Lot Help from another person to put on and taking off regular upper body clothing?: A Little Help from another person to put on and taking off regular lower body clothing?: A Lot 6 Click Score: 16    End of Session Equipment Utilized During Treatment: Oxygen (HHFNC 30  liters (50-70%Fio2))  OT Visit Diagnosis: Unsteadiness on feet (R26.81);Muscle weakness (generalized) (M62.81)   Activity Tolerance  (increased time for all activties due to increased work of breathing)   Patient Left in chair;with call bell/phone within reach;with chair alarm set   Nurse Communication  (RN and NT--purewick container close to full)        Time: [phone] OT Time Calculation (min): 29 min  Charges: OT General Charges $OT Visit: 1 Visit OT Treatments $Self Care/Home Management : 8-22 mins $Therapeutic Exercise: 8-22 mins  [place], OTR/L [facility] [employer] Pager [phone] Office  [phone]     Almon Register 10/10/2020, 1:31 PM

## 2020-10-11 DIAGNOSIS — E785 Hyperlipidemia, unspecified: Secondary | ICD-10-CM

## 2020-10-11 DIAGNOSIS — L89151 Pressure ulcer of sacral region, stage 1: Secondary | ICD-10-CM

## 2020-10-11 DIAGNOSIS — I214 Non-ST elevation (NSTEMI) myocardial infarction: Secondary | ICD-10-CM

## 2020-10-11 DIAGNOSIS — N390 Urinary tract infection, site not specified: Secondary | ICD-10-CM

## 2020-10-11 DIAGNOSIS — D649 Anemia, unspecified: Secondary | ICD-10-CM

## 2020-10-11 LAB — CBC WITH DIFFERENTIAL/PLATELET
Abs Immature Granulocytes: 0.17 10*3/uL — ABNORMAL HIGH (ref 0.00–0.07)
Basophils Absolute: 0 10*3/uL (ref 0.0–0.1)
Basophils Relative: 0 %
Eosinophils Absolute: 0 10*3/uL (ref 0.0–0.5)
Eosinophils Relative: 0 %
HCT: 35.4 % — ABNORMAL LOW (ref 36.0–46.0)
Hemoglobin: 12.4 g/dL (ref 12.0–15.0)
Immature Granulocytes: 1 %
Lymphocytes Relative: 2 %
Lymphs Abs: 0.3 10*3/uL — ABNORMAL LOW (ref 0.7–4.0)
MCH: 28.4 pg (ref 26.0–34.0)
MCHC: 35 g/dL (ref 30.0–36.0)
MCV: 81 fL (ref 80.0–100.0)
Monocytes Absolute: 0.3 10*3/uL (ref 0.1–1.0)
Monocytes Relative: 2 %
Neutro Abs: 14.7 10*3/uL — ABNORMAL HIGH (ref 1.7–7.7)
Neutrophils Relative %: 95 %
Platelets: 80 10*3/uL — ABNORMAL LOW (ref 150–400)
RBC: 4.37 MIL/uL (ref 3.87–5.11)
RDW: 20.7 % — ABNORMAL HIGH (ref 11.5–15.5)
WBC: 15.5 10*3/uL — ABNORMAL HIGH (ref 4.0–10.5)
nRBC: 0 % (ref 0.0–0.2)

## 2020-10-11 LAB — COMPREHENSIVE METABOLIC PANEL
ALT: 31 U/L (ref 0–44)
AST: 16 U/L (ref 15–41)
Albumin: 2.9 g/dL — ABNORMAL LOW (ref 3.5–5.0)
Alkaline Phosphatase: 62 U/L (ref 38–126)
Anion gap: 10 (ref 5–15)
BUN: 43 mg/dL — ABNORMAL HIGH (ref 8–23)
CO2: 19 mmol/L — ABNORMAL LOW (ref 22–32)
Calcium: 9.9 mg/dL (ref 8.9–10.3)
Chloride: 106 mmol/L (ref 98–111)
Creatinine, Ser: 1.35 mg/dL — ABNORMAL HIGH (ref 0.44–1.00)
GFR, Estimated: 44 mL/min — ABNORMAL LOW (ref >60)
Glucose, Bld: 142 mg/dL — ABNORMAL HIGH (ref 70–99)
Potassium: 4.6 mmol/L (ref 3.5–5.1)
Sodium: 135 mmol/L (ref 135–145)
Total Bilirubin: 0.9 mg/dL (ref 0.3–1.2)
Total Protein: 5.1 g/dL — ABNORMAL LOW (ref 6.5–8.1)

## 2020-10-11 LAB — GLUCOSE, CAPILLARY
Glucose-Capillary: 115 mg/dL — ABNORMAL HIGH (ref 70–99)
Glucose-Capillary: 143 mg/dL — ABNORMAL HIGH (ref 70–99)
Glucose-Capillary: 185 mg/dL — ABNORMAL HIGH (ref 70–99)
Glucose-Capillary: 168 mg/dL — ABNORMAL HIGH (ref 70–99)

## 2020-10-11 MED ORDER — ENOXAPARIN SODIUM 40 MG/0.4ML ~~LOC~~ SOLN
40.0000 mg | SUBCUTANEOUS | Status: DC
  Administered 2020-10-12 – 2020-10-13 (×2): 40 mg via SUBCUTANEOUS
  Filled 2020-10-11 (×2): qty 0.4

## 2020-10-11 NOTE — Care Management (Addendum)
10:45 Notified by [facility] liaison, Anderson Malta, that there are no beds available today. She states auth expires tomorrow.   12:45 Spoke w Marlowe Kays at [facility] who states that Josem Kaufmann [phone]) expires today, but case was "tasked out" to Monday pending patient having had been transferred to [facility] this weekend (which may not happen). Marlowe Kays is checking with her weekend leadership for approval to extend Auth to Monday. She states she will reach back out to me today with that decision.  15:20 Received call back from [place] who states that patient has Josem Kaufmann that covers her going to [facility] until Monday. If she does not go to [facility] Monday, we would need to reapply for authorization.

## 2020-10-11 NOTE — Progress Notes (Signed)
PROGRESS NOTE    [name] [name]  KPT:[id number] DOB: 1955/10/26 DOA: 08/18/2020 PCP: Glendale Chard, MD    Brief Narrative:  [name] [name] is a 65 year-old female with past medical history significant for renal transplant x2 on chronic immunosuppression, type 2 diabetes mellitus, hypothyroidism who initially presented to ED with progressive shortness of breath.  Patient was found to be in acute hypoxic respite failure secondary to Covid-19 viral pneumonia.  Patient completed treatment for Covid-19.  Hospital course prolonged and complicated by slow improvement from severe hypoxia, AKA and NSTEMI.  Patient continues to require high flow nasal cannula in pending LTAC placement pending bed availability.   Assessment & Plan:   Principal Problem:   ARF (acute renal failure) (HCC) Active Problems:   Complicated urinary tract infection   AKI (acute kidney injury) (HCC)   Fever, unspecified   Renal transplant recipient   Immunocompromised ([place])   DM (diabetes mellitus), type 2 with renal complications ([place])   Essential hypertension with goal blood pressure less than 140/90   HLD (hyperlipidemia)   Acute hypoxemic respiratory failure due to COVID-19 ([place])   Respiratory failure (HCC)   Acute hyperkalemia   Diabetic ketoacidosis without coma associated with type 1 diabetes mellitus ([place])   Acute and chronic respiratory failure with hypoxia (HCC)   HCAP (healthcare-associated pneumonia)   Pulmonary fibrosis, postinflammatory (HCC)   Transaminitis   Controlled type 2 diabetes mellitus with hyperglycemia, with long-term current use of insulin (HCC)   Decubitus ulcer of coccygeal region, stage 1   Normocytic anemia   Physical deconditioning   NSTEMI (non-ST elevated myocardial infarction) (HCC)   Moderate protein-calorie malnutrition ([place])   Uncontrolled type 2 diabetes mellitus with hyperglycemia, with long-term current use of insulin (HCC)   Sepsis, POA Acute on  chronic hypoxic respiratory failure, POA Covid-19 viral pneumonia Community-acquired pneumonia Patient presenting to ED with progressive shortness of breath, found to be Covid-19 postive with multifocal pneumonia on chest x-ray.  Completed treatment with steroids, baricitinib, remdesivir.  Patient was also treated with superimposed bacterial pneumonia and completed 7-day course of cefepime.  CT chest demonstrates progressive pulmonary fibrosis secondary to Covid-19 infection. --Prednisone 50 mg p.o. twice daily (on 5mg  PO daily at baseline) --Ativan every 12 hours anxiety --Dilaudid as needed air hunger/anxiety --Continues on 30 L high flow nasal cannula with SPO2 95% today --Pending transfer to LTAC pending bed availability  Diabetic ketoacidosis Type 2 diabetes mellitus, with hyperglycemia Initially during hospitalization, in DKA requiring insulin drip; which has been subsequently titrated off.  Hemoglobin A1c 8.2 08/19/2020. --Levemir 25 units twice daily --NovoLog 2 units 3 times daily AC --SSI for coverage --CBG before every meal/at bedtime  AKI on CKD stage IV History of renal transplant Creatinine stable, 1.35 today. --Continue prednisone, higher dose than baseline as above --Continue Prograf and Myfortic  Anemia of chronic kidney disease Transfused 1 unit PRBC on 09/11/2020.  Hemoglobin stable, 12.4 today.  Stage I decubitus ulcer to coccyx Pressure Injury 08/20/20 Coccyx Mid Stage 1 -  Intact skin with non-blanchable redness of a localized area usually over a bony prominence. (Active)  08/20/20 0800  Location: Coccyx  Location Orientation: Mid  Staging: Stage 1 -  Intact skin with non-blanchable redness of a localized area usually over a bony prominence.  Wound Description (Comments):   Present on Admission: Yes     NSTEMI:  Patient with chest pain on 1/23 with EKG changes.  Treated with Nitroglycerin, morphine and beta-blocker.  Echo is reassuring  with preserved  ejection fraction.  Continue aspirin/statin completed 48 hours of IV heparin.  Cardiology signed off.   Sinus pause: Episode occurring on1/20 with 4.5 seconds pause.  Beta-blocker discontinued. Currently stable.  Thrombocytopenia:  Most likely associated with acute illness. Continue to monitor,stable  Complicated UTI:  Completed course of Rocephin.  Hypothyroidism:  Continue Synthyroid.  Repeat TFT in 3 months  Hypertension:  Currently blood pressure stable. Continue medications  Hyperlipidemia: Statin held due to elevated LFTs.  LFTs have normalized so can consider resumption of statin although oral intake remains variable  Obesity: BMI 33.9.  Discussed with patient needs for aggressive lifestyle changes/weight loss as this complicates all facets of care.  Outpatient follow-up with PCP.      DVT prophylaxis: Lovenox   Code Status: DNR Family Communication: No family present at bedside this morning  Disposition Plan:  Level of care: Progressive Status is: Inpatient  Remains inpatient appropriate because:Ongoing diagnostic testing needed not appropriate for outpatient work up, Unsafe d/c plan, IV treatments appropriate due to intensity of illness or inability to take PO and Inpatient level of care appropriate due to severity of illness   Dispo:  Patient From:  Home  Planned Disposition:  [facility]  Medically stable for discharge:  Yes, pending bed availability      Consultants:   PCCM  Cardiology  Procedures:   TTE  Antimicrobials:   Cefepime  Vancomycin  Ceftriaxone   Subjective: Patient seen and examined bedside, resting comfortably.  Continues on high flow nasal cannula, 30 L.  [facility].  No other questions or concerns at this time.  Denies headache, no chest pain, no palpitations, no worsening shortness of breath, no fever/chills/night sweats, no nausea/vomiting/diarrhea, no abdominal pain.  No  acute events overnight per nursing staff.  Objective: Vitals:   10/11/20 0200 10/11/20 0411 10/11/20 0718 10/11/20 0817  BP: 134/64 (!) 164/82  (!) 154/75  Pulse:  97  (!) 101  Resp: (!) 37 (!) 31    Temp:  98.5 F (36.9 C)    TempSrc:  Oral    SpO2: 95% 97% 95%   Weight:      Height:       No intake or output data in the 24 hours ending 10/11/20 1146 Filed Weights   10/01/20 0500 10/02/20 0500 10/07/20 0500  Weight: 86.3 kg 85.9 kg 84.6 kg    Examination:  General exam: Appears calm and comfortable, chronically ill in appearance Respiratory system: Decreased breath sounds bilateral bases, normal [place] effort without accessory muscle use, on 30 L high flow nasal cannula with SPO2 95% Cardiovascular system: S1 & S2 heard, RRR. No JVD, murmurs, rubs, gallops or clicks. No pedal edema. Gastrointestinal system: Abdomen is nondistended, soft and nontender. No organomegaly or masses felt. Normal bowel sounds heard. Central nervous system: Alert and oriented. No focal neurological deficits. Extremities: Symmetric 5 x 5 power. Skin: No rashes, lesions or ulcers Psychiatry: Judgement and insight appear normal. Mood & affect appropriate.     Data Reviewed: I have personally reviewed following labs and imaging studies  CBC: Recent Labs  Lab 10/05/20 0054 10/11/20 0115  WBC 11.5* 15.5*  NEUTROABS  --  14.7*  HGB 12.0 12.4  HCT 35.8* 35.4*  MCV 83.4 81.0  PLT 119* 80*   Basic Metabolic Panel: Recent Labs  Lab 10/05/20 0054 10/11/20 0115  NA 136 135  K 5.1 4.6  CL 108 106  CO2 20* 19*  GLUCOSE  264* 142*  BUN 42* 43*  CREATININE 1.57* 1.35*  CALCIUM 9.5 9.9  MG 1.9  --   PHOS 3.1  --    GFR: Estimated Creatinine Clearance: 41.9 mL/min (A) (by C-G formula based on SCr of 1.35 mg/dL (H)). Liver Function Tests: Recent Labs  Lab 10/11/20 0115  AST 16  ALT 31  ALKPHOS 62  BILITOT 0.9  PROT 5.1*  ALBUMIN 2.9*   No results for input(s): LIPASE, AMYLASE in the  last 168 hours. No results for input(s): AMMONIA in the last 168 hours. Coagulation Profile: No results for input(s): INR, PROTIME in the last 168 hours. Cardiac Enzymes: No results for input(s): CKTOTAL, CKMB, CKMBINDEX, TROPONINI in the last 168 hours. BNP (last 3 results) No results for input(s): PROBNP in the last 8760 hours. HbA1C: No results for input(s): HGBA1C in the last 72 hours. CBG: Recent Labs  Lab 10/09/20 2105 10/10/20 0724 10/10/20 1134 10/10/20 1638 10/11/20 0816  GLUCAP 286* 197* 247* 333* 115*   Lipid Profile: No results for input(s): CHOL, HDL, LDLCALC, TRIG, CHOLHDL, LDLDIRECT in the last 72 hours. Thyroid Function Tests: No results for input(s): TSH, T4TOTAL, FREET4, T3FREE, THYROIDAB in the last 72 hours. Anemia Panel: No results for input(s): VITAMINB12, FOLATE, FERRITIN, TIBC, IRON, RETICCTPCT in the last 72 hours. Sepsis Labs: No results for input(s): PROCALCITON, LATICACIDVEN in the last 168 hours.  No results found for this or any previous visit (from the past 240 hour(s)).       Radiology Studies: DG CHEST PORT 1 VIEW  Result Date: 10/09/2020 CLINICAL DATA:  Shortness of breath EXAM: PORTABLE CHEST 1 VIEW COMPARISON:  September 29, 2020 FINDINGS: The heart size and mediastinal contours are within normal limits. Interval slight worsening in the fluffy patchy airspace opacities throughout both lungs. Probable trace bilateral pleural effusions are seen. No acute osseous abnormality. IMPRESSION: Interval slight worsening in the airspace opacities which could be due to multifocal pneumonia Electronically Signed   By: Prudencio Pair M.D.   On: 10/09/2020 17:50        Scheduled Meds: . amLODipine  10 mg Oral Daily  . vitamin C  500 mg Oral Daily  . aspirin EC  81 mg Oral Daily  . enoxaparin (LOVENOX) injection  30 mg Subcutaneous Q24H  . feeding supplement (NEPRO CARB STEADY)  237 mL Oral BID BM  . fluticasone  1 spray Each Nare Daily  . folic  acid  1 mg Oral Daily  . hydrALAZINE  50 mg Oral Q8H  . insulin aspart  0-20 Units Subcutaneous TID WC  . insulin aspart  2 Units Subcutaneous TID WC  . insulin detemir  25 Units Subcutaneous BID  . ipratropium-albuterol  3 mL Nebulization TID  . isosorbide mononitrate  60 mg Oral Daily  . levothyroxine  50 mcg Oral Q0600  . LORazepam  1 mg Oral BID  . mouth rinse  15 mL Mouth Rinse BID  . mycophenolate  360 mg Oral BID  . pantoprazole  40 mg Oral BID  . predniSONE  50 mg Oral BID WC  . tacrolimus  3 mg Oral QPM  . tacrolimus  4 mg Oral Daily  . zinc sulfate  220 mg Oral Daily   Continuous Infusions: . sodium chloride    . sodium chloride Stopped (08/31/20 1545)     LOS: 53 days    Time spent: 32 minutes spent on chart review, discussion with nursing staff, consultants, updating family and interview/physical exam; more than  50% of that time was spent in counseling and/or coordination of care.    Marcelles Clinard J [place] DO [facility] Available via Epic secure chat 7am-7pm After these hours, please refer to coverage provider listed on [email or web address] 10/11/2020, 11:46 AM

## 2020-10-12 LAB — GLUCOSE, CAPILLARY
Glucose-Capillary: 166 mg/dL — ABNORMAL HIGH (ref 70–99)
Glucose-Capillary: 162 mg/dL — ABNORMAL HIGH (ref 70–99)
Glucose-Capillary: 159 mg/dL — ABNORMAL HIGH (ref 70–99)
Glucose-Capillary: 224 mg/dL — ABNORMAL HIGH (ref 70–99)
Glucose-Capillary: 258 mg/dL — ABNORMAL HIGH (ref 70–99)

## 2020-10-12 NOTE — Progress Notes (Signed)
PROGRESS NOTE    [name] [name]  PNT:[id number] DOB: Dec 29, 1955 DOA: 08/18/2020 PCP: Glendale Chard, MD    Brief Narrative:  Breahna Boylen is a 65 year-old female with past medical history significant for renal transplant x2 on chronic immunosuppression, type 2 diabetes mellitus, hypothyroidism who initially presented to ED with progressive shortness of breath.  Patient was found to be in acute hypoxic respite failure secondary to Covid-19 viral pneumonia.  Patient completed treatment for Covid-19.  Hospital course prolonged and complicated by slow improvement from severe hypoxia, AKA and NSTEMI.  Patient continues to require high flow nasal cannula in pending LTAC placement pending bed availability.   Assessment & Plan:   Principal Problem:   ARF (acute renal failure) (HCC) Active Problems:   Complicated urinary tract infection   AKI (acute kidney injury) (HCC)   Fever, unspecified   Renal transplant recipient   Immunocompromised ([place])   DM (diabetes mellitus), type 2 with renal complications ([place])   Essential hypertension with goal blood pressure less than 140/90   HLD (hyperlipidemia)   Acute hypoxemic respiratory failure due to COVID-19 ([place])   Respiratory failure (HCC)   Acute hyperkalemia   Diabetic ketoacidosis without coma associated with type 1 diabetes mellitus ([place])   Acute and chronic respiratory failure with hypoxia (HCC)   HCAP (healthcare-associated pneumonia)   Pulmonary fibrosis, postinflammatory (HCC)   Transaminitis   Controlled type 2 diabetes mellitus with hyperglycemia, with long-term current use of insulin (HCC)   Decubitus ulcer of coccygeal region, stage 1   Normocytic anemia   Physical deconditioning   NSTEMI (non-ST elevated myocardial infarction) (HCC)   Moderate protein-calorie malnutrition ([place])   Uncontrolled type 2 diabetes mellitus with hyperglycemia, with long-term current use of insulin (HCC)   Sepsis, POA Acute on  chronic hypoxic respiratory failure, POA Covid-19 viral pneumonia Community-acquired pneumonia Patient presenting to ED with progressive shortness of breath, found to be Covid-19 postive with multifocal pneumonia on chest x-ray.  Completed treatment with steroids, baricitinib, remdesivir.  Patient was also treated with superimposed bacterial pneumonia and completed 7-day course of cefepime.  CT chest demonstrates progressive pulmonary fibrosis secondary to Covid-19 infection. --Prednisone 50 mg p.o. twice daily (on 5mg  PO daily at baseline) --Ativan every 12 hours anxiety --Dilaudid as needed air hunger/anxiety --Continues on 30 L high flow nasal cannula with SPO2 95% today --Pending transfer to LTAC pending bed availability  Diabetic ketoacidosis Type 2 diabetes mellitus, with hyperglycemia Initially during hospitalization, in DKA requiring insulin drip; which has been subsequently titrated off.  Hemoglobin A1c 8.2 08/19/2020. --Levemir 25 units twice daily --NovoLog 2 units 3 times daily AC --SSI for coverage --CBG before every meal/at bedtime  AKI on CKD stage IV History of renal transplant Creatinine stable, 1.35 today. --Continue prednisone, higher dose than baseline as above --Continue Prograf and Myfortic  Anemia of chronic kidney disease Transfused 1 unit PRBC on 09/11/2020.  Hemoglobin stable, 12.4 today.  Stage I decubitus ulcer to coccyx Pressure Injury 08/20/20 Coccyx Mid Stage 1 -  Intact skin with non-blanchable redness of a localized area usually over a bony prominence. (Active)  08/20/20 0800  Location: Coccyx  Location Orientation: Mid  Staging: Stage 1 -  Intact skin with non-blanchable redness of a localized area usually over a bony prominence.  Wound Description (Comments):   Present on Admission: Yes     NSTEMI:  Patient with chest pain on 1/23 with EKG changes.  Treated with Nitroglycerin, morphine and beta-blocker.  Echo is reassuring  with preserved  ejection fraction.  Continue aspirin/statin completed 48 hours of IV heparin.  Cardiology signed off.   Sinus pause: Episode occurring on1/20 with 4.5 seconds pause.  Beta-blocker discontinued. Currently stable.  Thrombocytopenia:  Most likely associated with acute illness. Continue to monitor,stable  Complicated UTI:  Completed course of Rocephin.  Hypothyroidism:  Continue Synthyroid.  Repeat TFT in 3 months  Hypertension:  Currently blood pressure stable. Continue medications  Hyperlipidemia: Statin held due to elevated LFTs.  LFTs have normalized so can consider resumption of statin although oral intake remains variable  Obesity: BMI 33.9.  Discussed with patient needs for aggressive lifestyle changes/weight loss as this complicates all facets of care.  Outpatient follow-up with PCP.      DVT prophylaxis: Lovenox   Code Status: DNR Family Communication: No family present at bedside this morning  Disposition Plan:  Level of care: Progressive Status is: Inpatient  Remains inpatient appropriate because:Ongoing diagnostic testing needed not appropriate for outpatient work up, Unsafe d/c plan, IV treatments appropriate due to intensity of illness or inability to take PO and Inpatient level of care appropriate due to severity of illness   Dispo:  Patient From:  Home  [facility]  Medically stable for discharge:  Yes, pending bed availability      Consultants:   PCCM  Cardiology  Procedures:   TTE  Antimicrobials:   Cefepime  Vancomycin  Ceftriaxone   Subjective: Patient seen and examined bedside, resting comfortably.  Reports good sleep overnight.  Eating breakfast this morning.  Continues on high flow nasal cannula, 40 L.  No other questions or concerns at this time.  Denies headache, no chest pain, no palpitations, no worsening shortness of breath, no fever/chills/night sweats, no nausea/vomiting/diarrhea, no  abdominal pain.  No acute events overnight per nursing staff.  Objective: Vitals:   10/12/20 0100 10/12/20 0310 10/12/20 0400 10/12/20 0832  BP: (!) 111/51   (!) 143/61  Pulse:  95 96   Resp: (!) 37 (!) 25  (!) 36  Temp:   97.8 F (36.6 C) (!) 97.3 F (36.3 C)  TempSrc:   Oral Oral  SpO2: 94% 93%  93%  Weight:      Height:        Intake/Output Summary (Last 24 hours) at 10/12/2020 1231 Last data filed at 10/12/2020 0410 Gross per 24 hour  Intake --  Output 200 ml  Net -200 ml   Filed Weights   10/01/20 0500 10/02/20 0500 10/07/20 0500  Weight: 86.3 kg 85.9 kg 84.6 kg    Examination:  General exam: Appears calm and comfortable, chronically ill in appearance Respiratory system: Decreased breath sounds bilateral bases, normal Respaire effort without accessory muscle use, on 40 L high flow nasal cannula with SPO2 95% Cardiovascular system: S1 & S2 heard, RRR. No JVD, murmurs, rubs, gallops or clicks. No pedal edema. Gastrointestinal system: Abdomen is nondistended, soft and nontender. No organomegaly or masses felt. Normal bowel sounds heard. Central nervous system: Alert and oriented. No focal neurological deficits. Extremities: Symmetric 5 x 5 power. Skin: No rashes, lesions or ulcers Psychiatry: Judgement and insight appear normal. Mood & affect appropriate.     Data Reviewed: I have personally reviewed following labs and imaging studies  CBC: Recent Labs  Lab 10/11/20 0115  WBC 15.5*  NEUTROABS 14.7*  HGB 12.4  HCT 35.4*  MCV 81.0  PLT 80*   Basic Metabolic Panel: Recent Labs  Lab 10/11/20 0115  NA 135  K 4.6  CL 106  CO2 19*  GLUCOSE 142*  BUN 43*  CREATININE 1.35*  CALCIUM 9.9   GFR: Estimated Creatinine Clearance: 41.9 mL/min (A) (by C-G formula based on SCr of 1.35 mg/dL (H)). Liver Function Tests: Recent Labs  Lab 10/11/20 0115  AST 16  ALT 31  ALKPHOS 62  BILITOT 0.9  PROT 5.1*  ALBUMIN 2.9*   No results for input(s): LIPASE, AMYLASE  in the last 168 hours. No results for input(s): AMMONIA in the last 168 hours. Coagulation Profile: No results for input(s): INR, PROTIME in the last 168 hours. Cardiac Enzymes: No results for input(s): CKTOTAL, CKMB, CKMBINDEX, TROPONINI in the last 168 hours. BNP (last 3 results) No results for input(s): PROBNP in the last 8760 hours. HbA1C: No results for input(s): HGBA1C in the last 72 hours. CBG: Recent Labs  Lab 10/11/20 1220 10/11/20 1900 10/11/20 2132 10/12/20 0834 10/12/20 1150  GLUCAP 143* 185* 168* 166* 162*   Lipid Profile: No results for input(s): CHOL, HDL, LDLCALC, TRIG, CHOLHDL, LDLDIRECT in the last 72 hours. Thyroid Function Tests: No results for input(s): TSH, T4TOTAL, FREET4, T3FREE, THYROIDAB in the last 72 hours. Anemia Panel: No results for input(s): VITAMINB12, FOLATE, FERRITIN, TIBC, IRON, RETICCTPCT in the last 72 hours. Sepsis Labs: No results for input(s): PROCALCITON, LATICACIDVEN in the last 168 hours.  No results found for this or any previous visit (from the past 240 hour(s)).       Radiology Studies: No results found.      Scheduled Meds: . amLODipine  10 mg Oral Daily  . vitamin C  500 mg Oral Daily  . aspirin EC  81 mg Oral Daily  . enoxaparin (LOVENOX) injection  40 mg Subcutaneous Q24H  . feeding supplement (NEPRO CARB STEADY)  237 mL Oral BID BM  . fluticasone  1 spray Each Nare Daily  . folic acid  1 mg Oral Daily  . hydrALAZINE  50 mg Oral Q8H  . insulin aspart  0-20 Units Subcutaneous TID WC  . insulin aspart  2 Units Subcutaneous TID WC  . insulin detemir  25 Units Subcutaneous BID  . ipratropium-albuterol  3 mL Nebulization TID  . isosorbide mononitrate  60 mg Oral Daily  . levothyroxine  50 mcg Oral Q0600  . LORazepam  1 mg Oral BID  . mouth rinse  15 mL Mouth Rinse BID  . mycophenolate  360 mg Oral BID  . pantoprazole  40 mg Oral BID  . predniSONE  50 mg Oral BID WC  . tacrolimus  3 mg Oral QPM  . tacrolimus   4 mg Oral Daily  . zinc sulfate  220 mg Oral Daily   Continuous Infusions: . sodium chloride    . sodium chloride Stopped (08/31/20 1545)     LOS: 54 days    Time spent: 32 minutes spent on chart review, discussion with nursing staff, consultants, updating family and interview/physical exam; more than 50% of that time was spent in counseling and/or coordination of care.    Leyli Kevorkian J [place] DO [facility] Available via Epic secure chat 7am-7pm After these hours, please refer to coverage provider listed on [email or web address] 10/12/2020, 12:30 PM

## 2020-10-12 NOTE — Care Management (Signed)
09:30 Notified by [facility] liaison Anderson Malta that there is no bed available today

## 2020-10-13 ENCOUNTER — Inpatient Hospital Stay
Admit: 2020-10-13 | Discharge: 2020-11-07 | Disposition: E | Payer: HMO | Attending: Internal Medicine | Admitting: Internal Medicine

## 2020-10-13 DIAGNOSIS — R6889 Other general symptoms and signs: Secondary | ICD-10-CM

## 2020-10-13 DIAGNOSIS — R0902 Hypoxemia: Secondary | ICD-10-CM

## 2020-10-13 DIAGNOSIS — N19 Unspecified kidney failure: Secondary | ICD-10-CM

## 2020-10-13 DIAGNOSIS — J9690 Respiratory failure, unspecified, unspecified whether with hypoxia or hypercapnia: Secondary | ICD-10-CM

## 2020-10-13 DIAGNOSIS — U071 COVID-19: Secondary | ICD-10-CM

## 2020-10-13 DIAGNOSIS — T148XXA Other injury of unspecified body region, initial encounter: Secondary | ICD-10-CM

## 2020-10-13 LAB — GLUCOSE, CAPILLARY
Glucose-Capillary: 171 mg/dL — ABNORMAL HIGH (ref 70–99)
Glucose-Capillary: 166 mg/dL — ABNORMAL HIGH (ref 70–99)
Glucose-Capillary: 154 mg/dL — ABNORMAL HIGH (ref 70–99)

## 2020-10-13 MED ORDER — TACROLIMUS 1 MG PO CAPS: 4.0000 mg | ORAL_CAPSULE | Freq: Every day | ORAL | Status: AC

## 2020-10-13 MED ORDER — TACROLIMUS 1 MG PO CAPS: 3.0000 mg | ORAL_CAPSULE | Freq: Every evening | ORAL | Status: AC

## 2020-10-13 MED ORDER — LORAZEPAM 2 MG/ML PO CONC: 1.0000 mg | Freq: Two times a day (BID) | ORAL | 0 refills | Status: AC

## 2020-10-13 NOTE — Progress Notes (Signed)
PROGRESS NOTE    [name] [name]  ZOX:[id number] DOB: Dec 21, 1955 DOA: 08/18/2020 PCP: Glendale Chard, MD    Brief Narrative:  [name] [name] is a 65 year-old female with past medical history significant for renal transplant x2 on chronic immunosuppression, type 2 diabetes mellitus, hypothyroidism who initially presented to ED with progressive shortness of breath.  Patient was found to be in acute hypoxic respite failure secondary to Covid-19 viral pneumonia.  Patient completed treatment for Covid-19.  Hospital course prolonged and complicated by slow improvement from severe hypoxia, AKA and NSTEMI.  Patient continues to require high flow nasal cannula in pending LTAC placement pending bed availability.   Assessment & Plan:   Principal Problem:   ARF (acute renal failure) (HCC) Active Problems:   Complicated urinary tract infection   AKI (acute kidney injury) (HCC)   Fever, unspecified   Renal transplant recipient   Immunocompromised ([place])   DM (diabetes mellitus), type 2 with renal complications ([place])   Essential hypertension with goal blood pressure less than 140/90   HLD (hyperlipidemia)   Acute hypoxemic respiratory failure due to COVID-19 ([place])   Respiratory failure (HCC)   Acute hyperkalemia   Diabetic ketoacidosis without coma associated with type 1 diabetes mellitus ([place])   Acute and chronic respiratory failure with hypoxia (HCC)   HCAP (healthcare-associated pneumonia)   Pulmonary fibrosis, postinflammatory (HCC)   Transaminitis   Controlled type 2 diabetes mellitus with hyperglycemia, with long-term current use of insulin (HCC)   Decubitus ulcer of coccygeal region, stage 1   Normocytic anemia   Physical deconditioning   NSTEMI (non-ST elevated myocardial infarction) (HCC)   Moderate protein-calorie malnutrition ([place])   Uncontrolled type 2 diabetes mellitus with hyperglycemia, with long-term current use of insulin (HCC)   Sepsis, POA Acute on  chronic hypoxic respiratory failure, POA Covid-19 viral pneumonia Community-acquired pneumonia Patient presenting to ED with progressive shortness of breath, found to be Covid-19 postive with multifocal pneumonia on chest x-ray.  Completed treatment with steroids, baricitinib, remdesivir.  Patient was also treated with superimposed bacterial pneumonia and completed 7-day course of cefepime.  CT chest demonstrates progressive pulmonary fibrosis secondary to Covid-19 infection. --Prednisone 50 mg p.o. twice daily (on 5mg  PO daily at baseline) --Ativan every 12 hours anxiety --Dilaudid as needed air hunger/anxiety --Continues on 40 L high flow nasal cannula with SPO2 95% today --Pending transfer to LTAC pending bed availability  Diabetic ketoacidosis Type 2 diabetes mellitus, with hyperglycemia Initially during hospitalization, in DKA requiring insulin drip; which has been subsequently titrated off.  Hemoglobin A1c 8.2 08/19/2020. --Levemir 25 units twice daily --NovoLog 2 units 3 times daily AC --SSI for coverage --CBG before every meal/at bedtime  AKI on CKD stage IV History of renal transplant Creatinine stable, 1.35. --Continue prednisone, higher dose than baseline as above --Continue Prograf and Myfortic  Anemia of chronic kidney disease Transfused 1 unit PRBC on 09/11/2020.  Hemoglobin stable, 12.4 today.  Stage I decubitus ulcer to coccyx Pressure Injury 08/20/20 Coccyx Mid Stage 1 -  Intact skin with non-blanchable redness of a localized area usually over a bony prominence. (Active)  08/20/20 0800  Location: Coccyx  Location Orientation: Mid  Staging: Stage 1 -  Intact skin with non-blanchable redness of a localized area usually over a bony prominence.  Wound Description (Comments):   Present on Admission: Yes     NSTEMI:  Patient with chest pain on 1/23 with EKG changes.  Treated with Nitroglycerin, morphine and beta-blocker.  Echo is reassuring with  preserved ejection  fraction.  Continue aspirin/statin completed 48 hours of IV heparin.  Cardiology signed off.   Sinus pause: Episode occurring on1/20 with 4.5 seconds pause.  Beta-blocker discontinued. Currently stable.  Thrombocytopenia:  Most likely associated with acute illness. Continue to monitor,stable  Complicated UTI:  Completed course of Rocephin.  Hypothyroidism:  Continue Synthyroid.  Repeat TFT in 3 months  Hypertension:  Currently blood pressure stable. Continue medications  Hyperlipidemia: Statin held due to elevated LFTs.  LFTs have normalized so can consider resumption of statin although oral intake remains variable  Obesity: BMI 33.9.  Discussed with patient needs for aggressive lifestyle changes/weight loss as this complicates all facets of care.  Outpatient follow-up with PCP.      DVT prophylaxis: Lovenox   Code Status: DNR Family Communication: No family present at bedside this morning  Disposition Plan:  Level of care: Progressive Status is: Inpatient  Remains inpatient appropriate because:Ongoing diagnostic testing needed not appropriate for outpatient work up, Unsafe d/c plan, IV treatments appropriate due to intensity of illness or inability to take PO and Inpatient level of care appropriate due to severity of illness   Dispo:  Patient From:  Home  [facility]  Medically stable for discharge:  Yes, pending bed availability      Consultants:   PCCM  Cardiology  Procedures:   TTE  Antimicrobials:   Cefepime  Vancomycin  Ceftriaxone   Subjective: Patient seen and examined bedside, resting comfortably.  Reports good sleep overnight.  Eating breakfast this morning.  Continues on high flow nasal cannula, 40 L.  No other questions or concerns at this time.  Denies headache, no chest pain, no palpitations, no worsening shortness of breath, no fever/chills/night sweats, no nausea/vomiting/diarrhea, no abdominal  pain.  No acute events overnight per nursing staff.  Objective: Vitals:   11/02/2020 0531 10/30/2020 0700 10/25/2020 0749 10/30/2020 0755  BP: (!) 163/80 (!) 160/81  (!) 157/74  Pulse: 96   (!) 102  Resp:  (!) 32  20  Temp: 97.9 F (36.6 C)   98 F (36.7 C)  TempSrc: Axillary   Oral  SpO2: 96% 94% 95% 95%  Weight:      Height:       No intake or output data in the 24 hours ending 10/08/2020 0954 Filed Weights   10/02/20 0500 10/07/20 0500 10/22/2020 0500  Weight: 85.9 kg 84.6 kg 84.9 kg    Examination:  General exam: Appears calm and comfortable, chronically ill in appearance Respiratory system: Decreased breath sounds bilateral bases, normal Respaire effort without accessory muscle use, on 40 L high flow nasal cannula with SPO2 95% Cardiovascular system: S1 & S2 heard, RRR. No JVD, murmurs, rubs, gallops or clicks. No pedal edema. Gastrointestinal system: Abdomen is nondistended, soft and nontender. No organomegaly or masses felt. Normal bowel sounds heard. Central nervous system: Alert and oriented. No focal neurological deficits. Extremities: Symmetric 5 x 5 power. Skin: No rashes, lesions or ulcers Psychiatry: Judgement and insight appear normal. Mood & affect appropriate.     Data Reviewed: I have personally reviewed following labs and imaging studies  CBC: Recent Labs  Lab 10/11/20 0115  WBC 15.5*  NEUTROABS 14.7*  HGB 12.4  HCT 35.4*  MCV 81.0  PLT 80*   Basic Metabolic Panel: Recent Labs  Lab 10/11/20 0115  NA 135  K 4.6  CL 106  CO2 19*  GLUCOSE 142*  BUN 43*  CREATININE 1.35*  CALCIUM 9.9  GFR: Estimated Creatinine Clearance: 42 mL/min (A) (by C-G formula based on SCr of 1.35 mg/dL (H)). Liver Function Tests: Recent Labs  Lab 10/11/20 0115  AST 16  ALT 31  ALKPHOS 62  BILITOT 0.9  PROT 5.1*  ALBUMIN 2.9*   No results for input(s): LIPASE, AMYLASE in the last 168 hours. No results for input(s): AMMONIA in the last 168 hours. Coagulation  Profile: No results for input(s): INR, PROTIME in the last 168 hours. Cardiac Enzymes: No results for input(s): CKTOTAL, CKMB, CKMBINDEX, TROPONINI in the last 168 hours. BNP (last 3 results) No results for input(s): PROBNP in the last 8760 hours. HbA1C: No results for input(s): HGBA1C in the last 72 hours. CBG: Recent Labs  Lab 10/12/20 1150 10/12/20 1549 10/12/20 2123 10/12/20 2220 10/20/2020 0817  GLUCAP 162* 159* 224* 258* 171*   Lipid Profile: No results for input(s): CHOL, HDL, LDLCALC, TRIG, CHOLHDL, LDLDIRECT in the last 72 hours. Thyroid Function Tests: No results for input(s): TSH, T4TOTAL, FREET4, T3FREE, THYROIDAB in the last 72 hours. Anemia Panel: No results for input(s): VITAMINB12, FOLATE, FERRITIN, TIBC, IRON, RETICCTPCT in the last 72 hours. Sepsis Labs: No results for input(s): PROCALCITON, LATICACIDVEN in the last 168 hours.  No results found for this or any previous visit (from the past 240 hour(s)).       Radiology Studies: No results found.      Scheduled Meds: . amLODipine  10 mg Oral Daily  . vitamin C  500 mg Oral Daily  . aspirin EC  81 mg Oral Daily  . enoxaparin (LOVENOX) injection  40 mg Subcutaneous Q24H  . feeding supplement (NEPRO CARB STEADY)  237 mL Oral BID BM  . fluticasone  1 spray Each Nare Daily  . folic acid  1 mg Oral Daily  . hydrALAZINE  50 mg Oral Q8H  . insulin aspart  0-20 Units Subcutaneous TID WC  . insulin aspart  2 Units Subcutaneous TID WC  . insulin detemir  25 Units Subcutaneous BID  . ipratropium-albuterol  3 mL Nebulization TID  . isosorbide mononitrate  60 mg Oral Daily  . levothyroxine  50 mcg Oral Q0600  . LORazepam  1 mg Oral BID  . mouth rinse  15 mL Mouth Rinse BID  . mycophenolate  360 mg Oral BID  . pantoprazole  40 mg Oral BID  . predniSONE  50 mg Oral BID WC  . tacrolimus  3 mg Oral QPM  . tacrolimus  4 mg Oral Daily  . zinc sulfate  220 mg Oral Daily   Continuous Infusions: . sodium  chloride    . sodium chloride Stopped (08/31/20 1545)     LOS: 55 days    Time spent: 32 minutes spent on chart review, discussion with nursing staff, consultants, updating family and interview/physical exam; more than 50% of that time was spent in counseling and/or coordination of care.    Amiree No J [place] DO [facility] Available via Epic secure chat 7am-7pm After these hours, please refer to coverage provider listed on [email or web address] 10/10/2020, 9:54 AM

## 2020-10-13 NOTE — TOC Progression Note (Addendum)
Transition of Care [facility]) - Progression Note    Patient Details  Name: Glennis Borger MRN: [id number] Date of Birth: 1956/05/27  Transition of Care [facility] CM/SW [place], RN Phone Number: 10/10/2020, 8:21 AM  Clinical Narrative:    Case management spoke with Arley Phenix, CM with [facility] and the facility is hoping to have an admission bed available for the patient today.  [facility] administrator is calling [employer] to extend insurance authorization to the facility since the authorization expired on 10/12/2020 - Auth # [id number].  CM will continue to follow the patient for possible discharge to [facility] today, once admission bed is available.  11/02/2020 1020 - Spoke with Arley Phenix, CM on the phone and the patient will be able to transfer to the [facility] facility this afternoon after 1:30 pm today.  Luan Pulling, CM plans to visit with the patient in a few minutes to update her on the transfer to the facility today.  I called and spoke with Tobin Chad, daughter on the phone and she is aware that the patient will transfer later this afternoon.  I called and notified Dr. [place] that a discharge summary is needed prior to transfer to the facility.  MSW and CM will continue to follow the patient for discharge to LTAC today.   10/19/2020 1209 - Bedside nursing will be able to call nursing report to [phone] for Room #26 at around 1700 today when the admission bed is available at 1730 today.  The receiving physician is Dr. Satira Sark and Dr. Eric [place] will be made aware for physician contact prior to transfer.  Please call report at 1700 today for transfer to the facility at 1730 once bed is available.  Expected Discharge Plan: Long Term Acute Care (LTAC) Barriers to Discharge: Continued Medical Work up  Expected Discharge Plan and Services Expected Discharge Plan: [place]  (LTAC) In-house Referral: Clinical Social Work Discharge Planning Services: CM Consult Post Acute Care Choice: [place] arrangements for the past 2 months: Single Family Home                                       Social Determinants of Health (SDOH) Interventions    Readmission Risk Interventions Readmission Risk Prevention Plan 09/19/2020  Transportation Screening Complete  Medication Review Press photographer) Complete  PCP or Specialist appointment within 3-5 days of discharge Complete  HRI or Home Care Consult Complete  SW Recovery Care/Counseling Consult Complete  Palliative Care Screening Complete  Skilled Nursing Facility Complete  Some recent data might be hidden

## 2020-10-13 NOTE — Plan of Care (Signed)
  Problem: Clinical Measurements: Goal: Cardiovascular complication will be avoided Outcome: Progressing   Problem: Coping: Goal: Level of anxiety will decrease Outcome: Progressing   Problem: Pain Managment: Goal: General experience of comfort will improve Outcome: Progressing   

## 2020-10-14 LAB — CBC
HCT: 37.7 % (ref 36.0–46.0)
Hemoglobin: 12.7 g/dL (ref 12.0–15.0)
MCH: 27.3 pg (ref 26.0–34.0)
MCHC: 33.7 g/dL (ref 30.0–36.0)
MCV: 81.1 fL (ref 80.0–100.0)
Platelets: 74 10*3/uL — ABNORMAL LOW (ref 150–400)
RBC: 4.65 MIL/uL (ref 3.87–5.11)
RDW: 20 % — ABNORMAL HIGH (ref 11.5–15.5)
WBC: 12.9 10*3/uL — ABNORMAL HIGH (ref 4.0–10.5)
nRBC: 0 % (ref 0.0–0.2)

## 2020-10-14 LAB — BASIC METABOLIC PANEL
Anion gap: 10 (ref 5–15)
BUN: 47 mg/dL — ABNORMAL HIGH (ref 8–23)
CO2: 20 mmol/L — ABNORMAL LOW (ref 22–32)
Calcium: 10.1 mg/dL (ref 8.9–10.3)
Chloride: 106 mmol/L (ref 98–111)
Creatinine, Ser: 1.34 mg/dL — ABNORMAL HIGH (ref 0.44–1.00)
GFR, Estimated: 44 mL/min — ABNORMAL LOW (ref >60)
Glucose, Bld: 145 mg/dL — ABNORMAL HIGH (ref 70–99)
Potassium: 4.4 mmol/L (ref 3.5–5.1)
Sodium: 136 mmol/L (ref 135–145)

## 2020-10-18 ENCOUNTER — Other Ambulatory Visit (HOSPITAL_COMMUNITY): Payer: HMO

## 2020-10-18 LAB — BLOOD GAS, ARTERIAL
Acid-base deficit: 7.5 mmol/L — ABNORMAL HIGH (ref 0.0–2.0)
Bicarbonate: 16.3 mmol/L — ABNORMAL LOW (ref 20.0–28.0)
Drawn by: 164
FIO2: 75
O2 Saturation: 93 %
Patient temperature: 37
pCO2 arterial: 26.7 mmHg — ABNORMAL LOW (ref 32.0–48.0)
pH, Arterial: 7.404 (ref 7.350–7.450)
pO2, Arterial: 65.8 mmHg — ABNORMAL LOW (ref 83.0–108.0)

## 2020-10-19 ENCOUNTER — Other Ambulatory Visit (HOSPITAL_COMMUNITY): Payer: HMO

## 2020-10-19 LAB — CBC
HCT: 40.6 % (ref 36.0–46.0)
Hemoglobin: 13.2 g/dL (ref 12.0–15.0)
MCH: 27.2 pg (ref 26.0–34.0)
MCHC: 32.5 g/dL (ref 30.0–36.0)
MCV: 83.5 fL (ref 80.0–100.0)
Platelets: 24 10*3/uL — CL (ref 150–400)
RBC: 4.86 MIL/uL (ref 3.87–5.11)
RDW: 19.9 % — ABNORMAL HIGH (ref 11.5–15.5)
WBC: 6.6 10*3/uL (ref 4.0–10.5)
nRBC: 0 % (ref 0.0–0.2)

## 2020-10-19 LAB — BASIC METABOLIC PANEL
Anion gap: 8 (ref 5–15)
BUN: 45 mg/dL — ABNORMAL HIGH (ref 8–23)
CO2: 17 mmol/L — ABNORMAL LOW (ref 22–32)
Calcium: 9.6 mg/dL (ref 8.9–10.3)
Chloride: 112 mmol/L — ABNORMAL HIGH (ref 98–111)
Creatinine, Ser: 1.3 mg/dL — ABNORMAL HIGH (ref 0.44–1.00)
GFR, Estimated: 46 mL/min — ABNORMAL LOW (ref >60)
Glucose, Bld: 121 mg/dL — ABNORMAL HIGH (ref 70–99)
Potassium: 5.1 mmol/L (ref 3.5–5.1)
Sodium: 137 mmol/L (ref 135–145)

## 2020-10-20 LAB — CBC
HCT: 38.3 % (ref 36.0–46.0)
Hemoglobin: 13.4 g/dL (ref 12.0–15.0)
MCH: 28.1 pg (ref 26.0–34.0)
MCHC: 35 g/dL (ref 30.0–36.0)
MCV: 80.3 fL (ref 80.0–100.0)
Platelets: 88 10*3/uL — ABNORMAL LOW (ref 150–400)
RBC: 4.77 MIL/uL (ref 3.87–5.11)
RDW: 19.9 % — ABNORMAL HIGH (ref 11.5–15.5)
WBC: 9.9 10*3/uL (ref 4.0–10.5)
nRBC: 0 % (ref 0.0–0.2)

## 2020-10-20 LAB — HEPARIN INDUCED PLATELET AB (HIT ANTIBODY): Heparin Induced Plt Ab: 0.078 OD (ref 0.000–0.400)

## 2020-10-21 NOTE — Chronic Care Management (AMB) (Signed)
Patient is still currently admitted to hospital will message Dr. Baird Cancer that the care team can outreach to patient once discharged from [place]

## 2020-10-22 LAB — CBC
HCT: 44.9 % (ref 36.0–46.0)
Hemoglobin: 14.9 g/dL (ref 12.0–15.0)
MCH: 27.5 pg (ref 26.0–34.0)
MCHC: 33.2 g/dL (ref 30.0–36.0)
MCV: 82.8 fL (ref 80.0–100.0)
Platelets: 79 10*3/uL — ABNORMAL LOW (ref 150–400)
RBC: 5.42 MIL/uL — ABNORMAL HIGH (ref 3.87–5.11)
RDW: 20.2 % — ABNORMAL HIGH (ref 11.5–15.5)
WBC: 13.7 10*3/uL — ABNORMAL HIGH (ref 4.0–10.5)
nRBC: 0 % (ref 0.0–0.2)

## 2020-10-23 LAB — BASIC METABOLIC PANEL
Anion gap: 9 (ref 5–15)
BUN: 53 mg/dL — ABNORMAL HIGH (ref 8–23)
CO2: 21 mmol/L — ABNORMAL LOW (ref 22–32)
Calcium: 9.9 mg/dL (ref 8.9–10.3)
Chloride: 109 mmol/L (ref 98–111)
Creatinine, Ser: 1.26 mg/dL — ABNORMAL HIGH (ref 0.44–1.00)
GFR, Estimated: 47 mL/min — ABNORMAL LOW (ref >60)
Glucose, Bld: 127 mg/dL — ABNORMAL HIGH (ref 70–99)
Potassium: 4.8 mmol/L (ref 3.5–5.1)
Sodium: 139 mmol/L (ref 135–145)

## 2020-10-24 ENCOUNTER — Other Ambulatory Visit (HOSPITAL_COMMUNITY): Payer: HMO

## 2020-10-24 LAB — BLOOD GAS, ARTERIAL
Acid-base deficit: 4.2 mmol/L — ABNORMAL HIGH (ref 0.0–2.0)
Bicarbonate: 19.6 mmol/L — ABNORMAL LOW (ref 20.0–28.0)
Drawn by: 164
FIO2: 100
O2 Saturation: 94.3 %
Patient temperature: 37
pCO2 arterial: 31.3 mmHg — ABNORMAL LOW (ref 32.0–48.0)
pH, Arterial: 7.414 (ref 7.350–7.450)
pO2, Arterial: 69.1 mmHg — ABNORMAL LOW (ref 83.0–108.0)

## 2020-10-24 LAB — TACROLIMUS LEVEL: Tacrolimus (FK506) - LabCorp: 9.9 ng/mL (ref 2.0–20.0)

## 2020-10-24 NOTE — Consult Note (Signed)
[facility] CONSULT NOTE    Date: 10/24/2020                  Patient Name:  [name] [name]  MRN: [id number]  DOB: January 16, 1956  Age / Sex: 65 y.o., female         PCP: Glendale Chard, MD                 Service Requesting Consult:  Hospitalist                 Reason for Consult:  Management of renal transplant            History of Present Illness: Patient is a 65 y.o. female with a PMHx of ESRD status post renal transplantation x2 with most recent one in 2016, diabetes mellitus type 2, hypothyroidism, recent COVID-19 pneumonia, gout, hyperlipidemia, sickle cell trait, history of parathyroidectomy who was admitted to [facility] on 10/27/2020 for ongoing management.  She was originally admitted to [facility] on 08/18/2020.  She had a prolonged hospital course related primarily to COVID-19 pneumonia.  She remains on high flow nasal cannula.  Patient also had acute kidney injury during that hospitalization and was followed by [facility] for some period of time during the hospitalization.  It appears that her baseline creatinine ranges between 1.6-2.0.  Patient was off of tacrolimus and Myfortic temporarily.  She is now back on these medications.  Today she reports ongoing shortness of breath.  She remains on high flow nasal cannula at the moment.  Her most recent BUN was 53 with a creatinine of 1.26 which appears to be close to her baseline.   Medications: Outpatient medications: Medications Prior to Admission  Medication Sig Dispense Refill Last Dose  . acetaminophen (TYLENOL) 325 MG tablet Take 2 tablets (650 mg total) by mouth every 6 (six) hours as needed for mild pain (or Fever >/= 101).     Marland Kitchen acetaminophen (TYLENOL) 650 MG suppository Place 1 suppository (650 mg total) rectally every 6 (six) hours as needed for mild pain (or Fever >/= 101). 12 suppository 0   . albuterol (VENTOLIN HFA) 108 (90 Base) MCG/ACT inhaler Inhale 2 puffs into  the lungs every 4 (four) hours as needed for shortness of breath.     Marland Kitchen amLODipine (NORVASC) 10 MG tablet Take 1 tablet (10 mg total) by mouth daily.     Marland Kitchen aspirin EC 81 MG tablet Take 81 mg by mouth daily.     . barrier cream (NON-SPECIFIED) CREA Apply 1 application topically 2 (two) times daily as needed.     . enoxaparin (LOVENOX) 30 MG/0.3ML injection Inject 0.3 mLs (30 mg total) into the skin daily. 0 mL    . fluticasone (FLONASE) 50 MCG/ACT nasal spray Place 1 spray into both nostrils daily.  12   . folic acid (FOLVITE) 1 MG tablet Take 1 tablet (1 mg total) by mouth daily.     Marland Kitchen guaiFENesin (ROBITUSSIN) 100 MG/5ML SOLN Take 5 mLs (100 mg total) by mouth every 4 (four) hours as needed for cough or to loosen phlegm. 236 mL 0   . hydrALAZINE (APRESOLINE) 50 MG tablet Take 1 tablet (50 mg total) by mouth every 8 (eight) hours.     Marland Kitchen HYDROmorphone (DILAUDID) 2 MG tablet Take 0.5 tablets (1 mg total) by mouth every 4 (four) hours as needed for severe pain Air traffic controller hunger). 30 tablet 0   . insulin aspart (NOVOLOG) 100 UNIT/ML  injection Inject 0-20 Units into the skin 3 (three) times daily with meals. Correction coverage: Resistant (obese, steroids)  CBG < 70: Implement Hypoglycemia Standing Orders and refer to Hypoglycemia Standing Orders sidebar report  CBG 70 - 120: 0 units  CBG 121 - 150: 3 units  CBG 151 - 200: 4 units  CBG 201 - 250: 7 units  CBG 251 - 300: 11 units  CBG 301 - 350: 15 units  CBG 351 - 400: 20 units  CBG > 400 call MD and obtain STAT lab verification 10 mL 11   . insulin aspart (NOVOLOG) 100 UNIT/ML injection Inject 2 Units into the skin 3 (three) times daily with meals. 10 mL 11   . insulin detemir (LEVEMIR) 100 UNIT/ML injection Inject 0.25 mLs (25 Units total) into the skin 2 (two) times daily. 10 mL 11   . ipratropium-albuterol (DUONEB) 0.5-2.5 (3) MG/3ML SOLN Take 3 mLs by nebulization 3 (three) times daily. 360 mL    . isosorbide mononitrate (IMDUR) 60 MG 24 hr tablet  Take 1 tablet (60 mg total) by mouth daily.     Marland Kitchen levothyroxine (SYNTHROID) 50 MCG tablet Take 1 tablet (50 mcg total) by mouth daily at 6 (six) AM.     . loperamide (IMODIUM) 2 MG capsule Take 1 capsule (2 mg total) by mouth as needed for diarrhea or loose stools. 30 capsule 0   . LORazepam (ATIVAN) 2 MG/ML concentrated solution Take 0.5 mLs (1 mg total) by mouth 2 (two) times daily. 30 mL 0   . Mouthwashes (MOUTH RINSE) LIQD solution 15 mLs by Mouth Rinse route 2 (two) times daily.  0   . mycophenolate (MYFORTIC) 180 MG EC tablet Take 360 mg by mouth 2 (two) times daily.      . Nutritional Supplements (FEEDING SUPPLEMENT, NEPRO CARB STEADY,) LIQD Take 237 mLs by mouth 2 (two) times daily between meals.  0   . ondansetron (ZOFRAN) 4 MG/2ML SOLN injection Inject 2 mLs (4 mg total) into the vein every 6 (six) hours as needed for nausea or vomiting. 2 mL 0   . pantoprazole (PROTONIX) 40 MG tablet Take 1 tablet (40 mg total) by mouth 2 (two) times daily.     . polyethylene glycol (MIRALAX / GLYCOLAX) 17 g packet Take 17 g by mouth daily as needed for moderate constipation. 14 each 0   . predniSONE (DELTASONE) 50 MG tablet Take 1 tablet (50 mg total) by mouth 2 (two) times daily with a meal.     . sodium chloride 0.9 % infusion Inject 10 mLs into the vein as needed (for administration of IV medications (carrier fluid)).  0   . tacrolimus (PROGRAF) 1 MG capsule Take 4 capsules (4 mg total) by mouth daily.     . tacrolimus (PROGRAF) 1 MG capsule Take 3 capsules (3 mg total) by mouth every evening.       Current medications: Sliding scale Humalog, amlodipine 10 mg daily, fluticasone 1 spray each nostril daily, folic acid 1 mg daily, hydralazine 50 mg every 8 hours, isosorbide mononitrate 30 mg every morning, Lantus 25 units subcutaneous twice daily, levothyroxine 0.05 mg daily, lorazepam 1 mg twice daily, melatonin 3 mg nightly, mycophenolate 500 mg twice daily, pantoprazole 40 mg twice daily, prednisone  50 mg twice daily, tacrolimus 4 mg in the a.m. and 3 mg nightly, vitamin C 500 mg daily, vitamin D 1000 units daily, zinc 220 mg daily    Allergies: Allergies  Allergen Reactions  .  Codeine Hives  . Acetaminophen-Codeine Rash      Past Medical History: Past Medical History:  Diagnosis Date  . Blood transfusion   . Chronic kidney disease    s/p transplant, kidney wasn't removed  . Gout due to renal impairment 2010   "before finding out I had kidney failure"  . History of renal transplant   . Hyperlipidemia   . Hypertension   . Sickle cell trait ([place])   . Thyroid disease   . Type II diabetes mellitus ([place])   . UTI (lower urinary tract infection) 12/08/11   "first one ever"  COVID-19 pneumonia   Past Surgical History: Past Surgical History:  Procedure Laterality Date  . BREAST BIOPSY Left 08/07/2010   benign stereo  . kidney transplant  02/2009   August 2016 was second transplant  . PARATHYROIDECTOMY    . TUBAL LIGATION  1980's  . VAGINAL HYSTERECTOMY  2006     Family History: Family History  Problem Relation Age of Onset  . Heart disease Father   . Hypertension Mother   . Breast cancer Cousin   . Hypertension Sister   . Hypertension Daughter   . Colon cancer Neg Hx   . Esophageal cancer Neg Hx   . Rectal cancer Neg Hx   . Stomach cancer Neg Hx      Social History: Social History   Socioeconomic History  . Marital status: Married    Spouse name: Not on file  . Number of children: Not on file  . Years of education: Not on file  . Highest education level: Not on file  Occupational History  . Occupation: retired  Tobacco Use  . Smoking status: Former Smoker    Packs/day: 0.50    Years: 10.00    Pack years: 5.00    Types: Cigarettes    Quit date: 11/07/1988    Years since quitting: 31.9  . Smokeless tobacco: Never Used  Vaping Use  . Vaping Use: Never used  Substance and Sexual Activity  . Alcohol use: No  . Drug use: No  . Sexual activity: Yes   Other Topics Concern  . Not on file  Social History Narrative  . Not on file   Social Determinants of Health   Financial Resource Strain: Low Risk   . Difficulty of Paying Living Expenses: Not hard at all  Food Insecurity: No Food Insecurity  . Worried About Charity fundraiser in the Last Year: Never true  . Ran Out of Food in the Last Year: Never true  Transportation Needs: No Transportation Needs  . Lack of Transportation (Medical): No  . Lack of Transportation (Non-Medical): No  Physical Activity: Insufficiently Active  . Days of Exercise per Week: 1 day  . Minutes of Exercise per Session: 20 min  Stress: No Stress Concern Present  . Feeling of Stress : Not at all  Social Connections: Moderately Integrated  . Frequency of Communication with Friends and Family: More than three times a week  . Frequency of Social Gatherings with Friends and Family: More than three times a week  . Attends Religious Services: More than 4 times per year  . Active Member of Clubs or Organizations: Not on file  . Attends Archivist Meetings: More than 4 times per year  . Marital Status: Widowed  Intimate Partner Violence: Not on file     Review of Systems: Review of Systems  Constitutional: Positive for malaise/fatigue. Negative for chills and fever.  HENT:  Negative for congestion, hearing loss and tinnitus.   Eyes: Negative for blurred vision and double vision.  Respiratory: Positive for cough and shortness of breath. Negative for sputum production.   Cardiovascular: Negative for chest pain, palpitations and orthopnea.  Gastrointestinal: Negative for diarrhea, nausea and vomiting.  Genitourinary: Negative for dysuria, frequency and urgency.  Musculoskeletal: Negative for myalgias.  Skin: Negative for itching and rash.  Neurological: Positive for weakness. Negative for dizziness and focal weakness.  Endo/Heme/Allergies: Negative for polydipsia. Does not bruise/bleed easily.   Psychiatric/Behavioral: Negative for depression. The patient is nervous/anxious.      Vital Signs: There were no vitals taken for this visit.  Weight trends: There were no vitals filed for this visit.  Physical Exam: Physical Exam: General:  No acute distress  Head:  Normocephalic, atraumatic. Moist oral mucosal membranes  Eyes:  Anicteric  Neck:  Supple  Lungs:   Clear to auscultation, normal effort  Heart:  S1S2 no rubs  Abdomen:   Soft, nontender, bowel sounds present  Extremities:  peripheral edema.  Neurologic:  Awake, alert, following commands  Skin:  No lesions  Access:     Lab results: Basic Metabolic Panel: Recent Labs  Lab 10/19/20 0626 10/23/20 1227  NA 137 139  K 5.1 4.8  CL 112* 109  CO2 17* 21*  GLUCOSE 121* 127*  BUN 45* 53*  CREATININE 1.30* 1.26*  CALCIUM 9.6 9.9    Liver Function Tests: No results for input(s): AST, ALT, ALKPHOS, BILITOT, PROT, ALBUMIN in the last 168 hours. No results for input(s): LIPASE, AMYLASE in the last 168 hours. No results for input(s): AMMONIA in the last 168 hours.  CBC: Recent Labs  Lab 10/19/20 0626 10/20/20 0427 10/22/20 1646  WBC 6.6 9.9 13.7*  HGB 13.2 13.4 14.9  HCT 40.6 38.3 44.9  MCV 83.5 80.3 82.8  PLT 24* 88* 79*    Cardiac Enzymes: No results for input(s): CKTOTAL, CKMB, CKMBINDEX, TROPONINI in the last 168 hours.  BNP: Invalid input(s): POCBNP  CBG: No results for input(s): GLUCAP in the last 168 hours.  Microbiology: Results for orders placed or performed during the hospital encounter of 08/18/20  Blood Culture (routine x 2)     Status: None   Collection Time: 08/18/20  8:56 PM   Specimen: BLOOD  Result Value Ref Range Status   Specimen Description BLOOD SITE NOT SPECIFIED  Final   Special Requests   Final    BOTTLES DRAWN AEROBIC ONLY Blood Culture results may not be optimal due to an inadequate volume of blood received in culture bottles   Culture   Final    NO GROWTH 5  DAYS Performed at [facility], [place] [address] [place], [place] [address]    Report Status 08/23/2020 FINAL  Final  Blood Culture (routine x 2)     Status: None   Collection Time: 08/18/20  8:56 PM   Specimen: BLOOD  Result Value Ref Range Status   Specimen Description BLOOD SITE NOT SPECIFIED  Final   Special Requests   Final    BOTTLES DRAWN AEROBIC AND ANAEROBIC Blood Culture results may not be optimal due to an inadequate volume of blood received in culture bottles   Culture   Final    NO GROWTH 5 DAYS Performed at [facility], [place] [address], [place], [place] [address]    Report Status 08/23/2020 FINAL  Final  Culture, Urine     Status: Abnormal   Collection Time:  08/19/20 10:07 AM   Specimen: Urine, Random  Result Value Ref Range Status   Specimen Description URINE, RANDOM  Final   Special Requests   Final    NONE Performed at [facility], [address], [place], [place] [address]    Culture >=100,000 COLONIES/mL KLEBSIELLA PNEUMONIAE (A)  Final   Report Status 08/21/2020 FINAL  Final   Organism ID, Bacteria KLEBSIELLA PNEUMONIAE (A)  Final      Susceptibility   Klebsiella pneumoniae - MIC*    AMPICILLIN >=32 RESISTANT Resistant     CEFAZOLIN <=4 SENSITIVE Sensitive     CEFEPIME <=0.12 SENSITIVE Sensitive     CEFTRIAXONE <=0.25 SENSITIVE Sensitive     CIPROFLOXACIN <=0.25 SENSITIVE Sensitive     GENTAMICIN <=1 SENSITIVE Sensitive     IMIPENEM <=0.25 SENSITIVE Sensitive     NITROFURANTOIN 32 SENSITIVE Sensitive     TRIMETH/SULFA <=20 SENSITIVE Sensitive     AMPICILLIN/SULBACTAM 8 SENSITIVE Sensitive     PIP/TAZO <=4 SENSITIVE Sensitive     * >=100,000 COLONIES/mL KLEBSIELLA PNEUMONIAE  MRSA PCR Screening     Status: None   Collection Time: 08/20/20 12:22 AM   Specimen: Nasopharyngeal  Result Value Ref Range Status   MRSA by PCR NEGATIVE NEGATIVE Final    Comment:        The GeneXpert MRSA Assay (FDA approved for NASAL specimens only), is  one component of a comprehensive MRSA colonization surveillance program. It is not intended to diagnose MRSA infection nor to guide or monitor treatment for MRSA infections. Performed at [facility], [place] [address], [place], [place] [address]   MRSA PCR Screening     Status: None   Collection Time: 09/07/20  9:37 AM   Specimen: Nasopharyngeal  Result Value Ref Range Status   MRSA by PCR NEGATIVE NEGATIVE Final    Comment:        The GeneXpert MRSA Assay (FDA approved for NASAL specimens only), is one component of a comprehensive MRSA colonization surveillance program. It is not intended to diagnose MRSA infection nor to guide or monitor treatment for MRSA infections. Performed at [facility], [place] [address], [place], [place] [address]   Culture, blood (routine x 2)     Status: None   Collection Time: 09/22/20  8:26 AM   Specimen: BLOOD LEFT HAND  Result Value Ref Range Status   Specimen Description BLOOD LEFT HAND  Final   Special Requests   Final    BOTTLES DRAWN AEROBIC ONLY Blood Culture adequate volume   Culture   Final    NO GROWTH 5 DAYS Performed at [facility], [address] [place],  [address]    Report Status 09/27/2020 FINAL  Final  Culture, blood (routine x 2)     Status: None   Collection Time: 09/22/20  8:27 AM   Specimen: BLOOD LEFT ARM  Result Value Ref Range Status   Specimen Description BLOOD LEFT ARM  Final   Special Requests   Final    BOTTLES DRAWN AEROBIC ONLY Blood Culture adequate volume   Culture   Final    NO GROWTH 5 DAYS Performed at [facility], [address], [place],  [address]    Report Status 09/27/2020 FINAL  Final  Aspergillus Ag, BAL/Serum     Status: None   Collection Time: 09/23/20  1:01 AM   Specimen: Vein; Blood  Result Value Ref Range Status   Aspergillus Ag,  BAL/Serum 0.03 0.00 - 0.49 Index Final    Comment: (NOTE) Performed At: [facility] [place], [place] [id number] Rush Farmer MD GZ:[id number]   MRSA PCR Screening     Status: None   Collection Time: 09/23/20  9:30 AM   Specimen: Nasal Mucosa; Nasopharyngeal  Result Value Ref Range Status   MRSA by PCR NEGATIVE NEGATIVE Final    Comment:        The GeneXpert MRSA Assay (FDA approved for NASAL specimens only), is one component of a comprehensive MRSA colonization surveillance program. It is not intended to diagnose MRSA infection nor to guide or monitor treatment for MRSA infections. Performed at [facility], [place] [address] [place], [place] [address]     Coagulation Studies: No results for input(s): LABPROT, INR in the last 72 hours.  Urinalysis: No results for input(s): COLORURINE, LABSPEC, PHURINE, GLUCOSEU, HGBUR, BILIRUBINUR, KETONESUR, PROTEINUR, UROBILINOGEN, NITRITE, LEUKOCYTESUR in the last 72 hours.  Invalid input(s): APPERANCEUR    Imaging:  No results found.   Assessment & Plan: Pt is a 65 y.o. female with a PMHx of ESRD status post renal transplantation x2 with most recent one in 2016, diabetes mellitus type 2, hypothyroidism, recent COVID-19 pneumonia, gout, hyperlipidemia, sickle cell trait, history of parathyroidectomy who was admitted to [facility] on 10/20/2020 for ongoing management  1.  Chronic kidney disease stage IIIa with history of renal transplantation.  Renal function appears to be stabilized with a creatinine of 1.26 and EGFR of 47.  Continue to periodically monitor tacrolimus level.  Continue current doses of tacrolimus, mycophenolate, and prednisone.  Avoid nephrotoxins as possible.  2.  Hypertension.  Maintain the patient on amlodipine and hydralazine.  3.  Acute respiratory failure secondary to COVID-19 pneumonia on high flow nasal cannula.  Patient still requiring high flow nasal cannula.  Appears to have some element of pulmonary fibrosis.  Management per pulmonary medicine.

## 2020-10-25 LAB — BLOOD GAS, ARTERIAL
Acid-base deficit: 4.2 mmol/L — ABNORMAL HIGH (ref 0.0–2.0)
Bicarbonate: 20.4 mmol/L (ref 20.0–28.0)
FIO2: 100
O2 Saturation: 96 %
Patient temperature: 36.8
pCO2 arterial: 37.3 mmHg (ref 32.0–48.0)
pH, Arterial: 7.355 (ref 7.350–7.450)
pO2, Arterial: 82.1 mmHg — ABNORMAL LOW (ref 83.0–108.0)

## 2020-10-26 LAB — CBC
HCT: 40.2 % (ref 36.0–46.0)
Hemoglobin: 14.2 g/dL (ref 12.0–15.0)
MCH: 28.3 pg (ref 26.0–34.0)
MCHC: 35.3 g/dL (ref 30.0–36.0)
MCV: 80.2 fL (ref 80.0–100.0)
Platelets: 54 10*3/uL — ABNORMAL LOW (ref 150–400)
RBC: 5.01 MIL/uL (ref 3.87–5.11)
RDW: 19.9 % — ABNORMAL HIGH (ref 11.5–15.5)
WBC: 14.8 10*3/uL — ABNORMAL HIGH (ref 4.0–10.5)
nRBC: 0 % (ref 0.0–0.2)

## 2020-10-26 LAB — BASIC METABOLIC PANEL
Anion gap: 8 (ref 5–15)
BUN: 47 mg/dL — ABNORMAL HIGH (ref 8–23)
CO2: 19 mmol/L — ABNORMAL LOW (ref 22–32)
Calcium: 9.9 mg/dL (ref 8.9–10.3)
Chloride: 112 mmol/L — ABNORMAL HIGH (ref 98–111)
Creatinine, Ser: 1.25 mg/dL — ABNORMAL HIGH (ref 0.44–1.00)
GFR, Estimated: 48 mL/min — ABNORMAL LOW (ref >60)
Glucose, Bld: 57 mg/dL — ABNORMAL LOW (ref 70–99)
Potassium: 3.8 mmol/L (ref 3.5–5.1)
Sodium: 139 mmol/L (ref 135–145)

## 2020-10-26 LAB — MAGNESIUM: Magnesium: 2.1 mg/dL (ref 1.7–2.4)

## 2020-10-26 LAB — TSH: TSH: 1.183 u[IU]/mL (ref 0.350–4.500)

## 2020-10-26 NOTE — Consult Note (Incomplete)
[facility]  PULMONARY SERVICE  Date of Service: 10/26/2020  PULMONARY CRITICAL CARE CONSULT   [name] [name]  QBH:[id number]  DOB: 1956/07/10   DOA: 10/22/2020  Referring Physician: Merton Border, MD  HPI: [name] [name] is a 65 y.o. female seen for follow up of Acute on Chronic Respiratory Failure. ***  Review of Systems:  ROS performed and is unremarkable other than noted above.  Past Medical History:  Diagnosis Date  . Blood transfusion   . Chronic kidney disease    s/p transplant, kidney wasn't removed  . Gout due to renal impairment 2010   "before finding out I had kidney failure"  . History of renal transplant   . Hyperlipidemia   . Hypertension   . Sickle cell trait ([place])   . Thyroid disease   . Type II diabetes mellitus ([place])   . UTI (lower urinary tract infection) 12/08/11   "first one ever"    Past Surgical History:  Procedure Laterality Date  . BREAST BIOPSY Left 08/07/2010   benign stereo  . kidney transplant  02/2009   August 2016 was second transplant  . PARATHYROIDECTOMY    . TUBAL LIGATION  1980's  . VAGINAL HYSTERECTOMY  2006    Social History:    reports that she quit smoking about 31 years ago. Her smoking use included cigarettes. She has a 5.00 pack-year smoking history. She has never used smokeless tobacco. She reports that she does not drink alcohol and does not use drugs.  Family History: Non-Contributory to the present illness  Allergies  Allergen Reactions  . Codeine Hives  . Acetaminophen-Codeine Rash    Medications: Reviewed on Rounds  Physical Exam:  Vitals: Temperature is 97.2 pulse 90 respiratory rate is 32 blood pressure 141/66 saturations 94%  Ventilator Settings currently on heated high flow oxygen.  Patient is on 35 L flow rate requiring 75% FiO2  . General: Comfortable at this time . Eyes: Grossly normal lids, irises & conjunctiva . ENT: grossly  tongue is normal . Neck: no obvious mass . Cardiovascular: S1-S2 normal no gallop or rub . Respiratory: Scattered rhonchi expansion is equal . Abdomen: Soft and nontender . Skin: no rash seen on limited exam . Musculoskeletal: not rigid . Psychiatric:unable to assess . Neurologic: no seizure no involuntary movements         Labs on Admission:  Basic Metabolic Panel: Recent Labs  Lab 10/23/20 1227  NA 139  K 4.8  CL 109  CO2 21*  GLUCOSE 127*  BUN 53*  CREATININE 1.26*  CALCIUM 9.9    Recent Labs  Lab 10/24/20 1612 10/25/20 0745  PHART 7.414 7.355  PCO2ART 31.3* 37.3  PO2ART 69.1* 82.1*  HCO3 19.6* 20.4  O2SAT 94.3 96.0    Liver Function Tests: No results for input(s): AST, ALT, ALKPHOS, BILITOT, PROT, ALBUMIN in the last 168 hours. No results for input(s): LIPASE, AMYLASE in the last 168 hours. No results for input(s): AMMONIA in the last 168 hours.  CBC: Recent Labs  Lab 10/20/20 0427 10/22/20 1646  WBC 9.9 13.7*  HGB 13.4 14.9  HCT 38.3 44.9  MCV 80.3 82.8  PLT 88* 79*    Cardiac Enzymes: No results for input(s): CKTOTAL, CKMB, CKMBINDEX, TROPONINI in the last 168 hours.  BNP (last 3 results) Recent Labs    09/11/20 0155 09/12/20 0113 09/15/20 0028  BNP 86.8 80.1 80.0    ProBNP (last 3 results) No results for input(s): PROBNP  in the last 8760 hours.   Radiological Exams on Admission: DG Chest Port 1 View  Result Date: 10/24/2020 CLINICAL DATA:  Hypoxia.  COVID-19. EXAM: PORTABLE CHEST 1 VIEW COMPARISON:  10/09/2020 FINDINGS: Stable low lung volumes. No significant change in patchy heterogeneous bilateral lung opacities. Stable heart size and mediastinal contours. Vascular stent in the region of the right subclavian. No pneumothorax, evidence of pneumomediastinum, or significant pleural effusion. Overlying monitoring devices in place. IMPRESSION: No significant change in bilateral patchy heterogeneous lung opacities over the past 5 days.  Findings likely represent COVID-19 sequela/fibrosis. Electronically Signed   By: Keith Rake M.D.   On: 10/24/2020 17:27    Assessment/Plan Active Problems:   * No active hospital problems. *   1. Acute on chronic respiratory failure hypoxia patient currently is on heated high flow requiring 75% FiO2.  We will try to wean the oxygen down as tolerated.  We will continue secretion management supportive care.  I have personally seen and evaluated the patient, evaluated laboratory and imaging results, formulated the assessment and plan and placed orders. The Patient requires high complexity decision making with multiple systems involvement.  Case was discussed on Rounds with the Respiratory Therapy Director and the Respiratory staff Time Spent 53minutes  Saadat A Khan, MD [facility] Pulmonary Critical Care Medicine Sleep Medicine

## 2020-10-28 ENCOUNTER — Other Ambulatory Visit (HOSPITAL_COMMUNITY): Payer: HMO

## 2020-10-28 LAB — CBC
HCT: 38.3 % (ref 36.0–46.0)
Hemoglobin: 12.6 g/dL (ref 12.0–15.0)
MCH: 27.3 pg (ref 26.0–34.0)
MCHC: 32.9 g/dL (ref 30.0–36.0)
MCV: 82.9 fL (ref 80.0–100.0)
Platelets: 45 10*3/uL — ABNORMAL LOW (ref 150–400)
RBC: 4.62 MIL/uL (ref 3.87–5.11)
RDW: 19.8 % — ABNORMAL HIGH (ref 11.5–15.5)
WBC: 15.1 10*3/uL — ABNORMAL HIGH (ref 4.0–10.5)
nRBC: 0 % (ref 0.0–0.2)

## 2020-10-28 LAB — BASIC METABOLIC PANEL
Anion gap: 7 (ref 5–15)
BUN: 50 mg/dL — ABNORMAL HIGH (ref 8–23)
CO2: 19 mmol/L — ABNORMAL LOW (ref 22–32)
Calcium: 9.6 mg/dL (ref 8.9–10.3)
Chloride: 111 mmol/L (ref 98–111)
Creatinine, Ser: 1.49 mg/dL — ABNORMAL HIGH (ref 0.44–1.00)
GFR, Estimated: 39 mL/min — ABNORMAL LOW (ref >60)
Glucose, Bld: 194 mg/dL — ABNORMAL HIGH (ref 70–99)
Potassium: 4.8 mmol/L (ref 3.5–5.1)
Sodium: 137 mmol/L (ref 135–145)

## 2020-10-28 NOTE — Progress Notes (Incomplete)
Pulmonary Critical Care Medicine [place]   PULMONARY CRITICAL CARE SERVICE  PROGRESS NOTE  Date of Service: 10/28/2020   Thuy Atilano  YWV:[id number]  DOB: 12-21-1955   DOA: 10/26/2020  Referring Physician: Merton Border, MD  HPI: [name] [name] is a 65 y.o. female seen for follow up of Acute on Chronic Respiratory Failure.  Patient has been weaned down to 12 L Oxymizer but apparently overnight had some desaturations so they increased her back up to a heated high flow 25 L with 100% FiO2  Medications: Reviewed on Rounds  Physical Exam:  Vitals: Temperature is 98.0 pulse 92 respiratory 28 blood pressure is 161/81 saturations 94%  Ventilator Settings heated high flow FiO2 100% 25 L  . General: Comfortable at this time . Eyes: Grossly normal lids, irises & conjunctiva . ENT: grossly tongue is normal . Neck: no obvious mass . Cardiovascular: S1 S2 normal no gallop . Respiratory: Scattered rhonchi no rales . Abdomen: soft . Skin: no rash seen on limited exam . Musculoskeletal: not rigid . Psychiatric:unable to assess . Neurologic: no seizure no involuntary movements         Lab Data:   Basic Metabolic Panel: Recent Labs  Lab 10/23/20 1227 10/26/20 1054 10/28/20 0455  NA 139 139 137  K 4.8 3.8 4.8  CL 109 112* 111  CO2 21* 19* 19*  GLUCOSE 127* 57* 194*  BUN 53* 47* 50*  CREATININE 1.26* 1.25* 1.49*  CALCIUM 9.9 9.9 9.6  MG  --  2.1  --     ABG: Recent Labs  Lab 10/24/20 1612 10/25/20 0745  PHART 7.414 7.355  PCO2ART 31.3* 37.3  PO2ART 69.1* 82.1*  HCO3 19.6* 20.4  O2SAT 94.3 96.0    Liver Function Tests: No results for input(s): AST, ALT, ALKPHOS, BILITOT, PROT, ALBUMIN in the last 168 hours. No results for input(s): LIPASE, AMYLASE in the last 168 hours. No results for input(s): AMMONIA in the last 168 hours.  CBC: Recent Labs  Lab 10/22/20 1646 10/26/20 1054 10/28/20 0455  WBC 13.7* 14.8* 15.1*  HGB  14.9 14.2 12.6  HCT 44.9 40.2 38.3  MCV 82.8 80.2 82.9  PLT 79* 54* 45*    Cardiac Enzymes: No results for input(s): CKTOTAL, CKMB, CKMBINDEX, TROPONINI in the last 168 hours.  BNP (last 3 results) Recent Labs    09/11/20 0155 09/12/20 0113 09/15/20 0028  BNP 86.8 80.1 80.0    ProBNP (last 3 results) No results for input(s): PROBNP in the last 8760 hours.  Radiological Exams: DG Chest Port 1 View  Result Date: 10/28/2020 CLINICAL DATA:  Respiratory failure.  History of sickle cell. EXAM: PORTABLE CHEST 1 VIEW COMPARISON:  10/24/2020.  09/29/2020.  CT chest 09/24/2020. FINDINGS: Mediastinum and hilar structures normal. Low lung volumes. Prominent bilateral interstitial infiltrates are again noted without interim change. No pleural effusion or pneumothorax. Heart size stable. Right subclavian stent again noted. Degenerative change thoracic spine. Sclerotic changes noted about the proximal left humerus. Avascular necrosis cannot be excluded. A fracture of the proximal left humerus cannot be completely excluded. Left humeral series can be obtained to further evaluate. IMPRESSION: 1. Low lung volumes. Prominent bilateral interstitial infiltrates are again noted without interim change. 2. Sclerotic changes noted about the proximal left humerus. Avascular necrosis cannot be excluded. A fracture of the proximal left humerus cannot be completely excluded. Left humeral series can be obtained to further evaluate. Electronically Signed   By: Marcello Moores  Register   On: 10/28/2020  05:56    Assessment/Plan Active Problems:   * No active hospital problems. *   1. Acute on chronic respiratory failure hypoxia the patient shows bilateral infiltrates probably an element of failure would recommend some diuresis.  Try to titrate oxygen down again   I have personally seen and evaluated the patient, evaluated laboratory and imaging results, formulated the assessment and plan and placed orders. The Patient  requires high complexity decision making with multiple systems involvement.  Rounds were done with the Respiratory Therapy Director and Staff therapists and discussed with nursing staff also.  Allyne Gee, MD [facility] Pulmonary Critical Care Medicine Sleep Medicine

## 2020-10-28 NOTE — Progress Notes (Incomplete)
Pulmonary Critical Care Medicine [place]   PULMONARY CRITICAL CARE SERVICE  PROGRESS NOTE    [name] [name]  [id number]  DOB: [birth year]   DOA: 10/25/2020  Referring Physician: Merton Border, MD  HPI: [name] [name] is a 65 y.o. female seen for follow up of Acute on Chronic Respiratory Failure.  Patient is on heated high flow oxygen right now appears to be comfortable  Medications: Reviewed on Rounds  Physical Exam:  Vitals: Temperature is 96.6 pulse 103 respiratory rate is 28 blood pressure 141/66 saturations 98%  Ventilator Settings on heated high flow oxygen 25 L  . General: Comfortable at this time . Eyes: Grossly normal lids, irises & conjunctiva . ENT: grossly tongue is normal . Neck: no obvious mass . Cardiovascular: S1 S2 normal no gallop . Respiratory: Coarse breath sounds with few scattered rhonchi . Abdomen: soft . Skin: no rash seen on limited exam . Musculoskeletal: not rigid . Psychiatric:unable to assess . Neurologic: no seizure no involuntary movements         Lab Data:   Basic Metabolic Panel: Recent Labs  Lab 10/23/20 1227 10/26/20 1054 10/28/20 0455  NA 139 139 137  K 4.8 3.8 4.8  CL 109 112* 111  CO2 21* 19* 19*  GLUCOSE 127* 57* 194*  BUN 53* 47* 50*  CREATININE 1.26* 1.25* 1.49*  CALCIUM 9.9 9.9 9.6  MG  --  2.1  --     ABG: Recent Labs  Lab 10/24/20 1612 10/25/20 0745  PHART 7.414 7.355  PCO2ART 31.3* 37.3  PO2ART 69.1* 82.1*  HCO3 19.6* 20.4  O2SAT 94.3 96.0    Liver Function Tests: No results for input(s): AST, ALT, ALKPHOS, BILITOT, PROT, ALBUMIN in the last 168 hours. No results for input(s): LIPASE, AMYLASE in the last 168 hours. No results for input(s): AMMONIA in the last 168 hours.  CBC: Recent Labs  Lab 10/22/20 1646 10/26/20 1054 10/28/20 0455  WBC 13.7* 14.8* 15.1*  HGB 14.9 14.2 12.6  HCT 44.9 40.2 38.3  MCV 82.8 80.2 82.9  PLT 79* 54* 45*    Cardiac  Enzymes: No results for input(s): CKTOTAL, CKMB, CKMBINDEX, TROPONINI in the last 168 hours.  BNP (last 3 results) Recent Labs    09/11/20 0155 09/12/20 0113 09/15/20 0028  BNP 86.8 80.1 80.0    ProBNP (last 3 results) No results for input(s): PROBNP in the last 8760 hours.  Radiological Exams: DG Chest Port 1 View  Result Date: 10/28/2020 CLINICAL DATA:  Respiratory failure.  History of sickle cell. EXAM: PORTABLE CHEST 1 VIEW COMPARISON:  10/24/2020.  09/29/2020.  CT chest 09/24/2020. FINDINGS: Mediastinum and hilar structures normal. Low lung volumes. Prominent bilateral interstitial infiltrates are again noted without interim change. No pleural effusion or pneumothorax. Heart size stable. Right subclavian stent again noted. Degenerative change thoracic spine. Sclerotic changes noted about the proximal left humerus. Avascular necrosis cannot be excluded. A fracture of the proximal left humerus cannot be completely excluded. Left humeral series can be obtained to further evaluate. IMPRESSION: 1. Low lung volumes. Prominent bilateral interstitial infiltrates are again noted without interim change. 2. Sclerotic changes noted about the proximal left humerus. Avascular necrosis cannot be excluded. A fracture of the proximal left humerus cannot be completely excluded. Left humeral series can be obtained to further evaluate. Electronically Signed   By: Marcello Moores  Register   On: 10/28/2020 05:56    Assessment/Plan Active Problems:   * No active hospital problems. *   1.  Acute on chronic respiratory failure with hypoxia plan is going to be to continue with trying to titrate the oxygen down as tolerated right now is on 70% FiO2.   I have personally seen and evaluated the patient, evaluated laboratory and imaging results, formulated the assessment and plan and placed orders. The Patient requires high complexity decision making with multiple systems involvement.  Rounds were done with the  Respiratory Therapy Director and Staff therapists and discussed with nursing staff also.  Allyne Gee, MD [facility] Pulmonary Critical Care Medicine Sleep Medicine

## 2020-10-29 LAB — BASIC METABOLIC PANEL
Anion gap: 7 (ref 5–15)
BUN: 53 mg/dL — ABNORMAL HIGH (ref 8–23)
CO2: 21 mmol/L — ABNORMAL LOW (ref 22–32)
Calcium: 9.8 mg/dL (ref 8.9–10.3)
Chloride: 108 mmol/L (ref 98–111)
Creatinine, Ser: 1.58 mg/dL — ABNORMAL HIGH (ref 0.44–1.00)
GFR, Estimated: 36 mL/min — ABNORMAL LOW (ref >60)
Glucose, Bld: 148 mg/dL — ABNORMAL HIGH (ref 70–99)
Potassium: 4.8 mmol/L (ref 3.5–5.1)
Sodium: 136 mmol/L (ref 135–145)

## 2020-10-29 LAB — BRAIN NATRIURETIC PEPTIDE: B Natriuretic Peptide: 553.5 pg/mL — ABNORMAL HIGH (ref 0.0–100.0)

## 2020-10-29 NOTE — Progress Notes (Incomplete)
Pulmonary Critical Care Medicine [place]   PULMONARY CRITICAL CARE SERVICE  PROGRESS NOTE  Date of Service: 10/29/2020   [name] [name]  [id number]  DOB: 1956/07/12   DOA: 10/28/2020  Referring Physician: Merton Border, MD  HPI: [name] [name] is a 65 y.o. female seen for follow up of Acute on Chronic Respiratory Failure.  Continues to not do very well remains on heated high flow right now on 100% FiO2.  Spoke with the primary care team regarding patient's goals of care going to speak with her regarding intubation if it becomes necessary  Medications: Reviewed on Rounds  Physical Exam:  Vitals: Temperature is 97.2 pulse 102 respiratory rate is 28 blood pressure 116/64 saturations 94%  Ventilator Settings on heated high flow FiO2 100% with 35 L flow rate  . General: Comfortable at this time . Eyes: Grossly normal lids, irises & conjunctiva . ENT: grossly tongue is normal . Neck: no obvious mass . Cardiovascular: S1 S2 normal no gallop . Respiratory: Scattered rhonchi expansion is equal . Abdomen: soft . Skin: no rash seen on limited exam . Musculoskeletal: not rigid . Psychiatric:unable to assess . Neurologic: no seizure no involuntary movements         Lab Data:   Basic Metabolic Panel: Recent Labs  Lab 10/23/20 1227 10/26/20 1054 10/28/20 0455 10/29/20 0502  NA 139 139 137 136  K 4.8 3.8 4.8 4.8  CL 109 112* 111 108  CO2 21* 19* 19* 21*  GLUCOSE 127* 57* 194* 148*  BUN 53* 47* 50* 53*  CREATININE 1.26* 1.25* 1.49* 1.58*  CALCIUM 9.9 9.9 9.6 9.8  MG  --  2.1  --   --     ABG: Recent Labs  Lab 10/24/20 1612 10/25/20 0745  PHART 7.414 7.355  PCO2ART 31.3* 37.3  PO2ART 69.1* 82.1*  HCO3 19.6* 20.4  O2SAT 94.3 96.0    Liver Function Tests: No results for input(s): AST, ALT, ALKPHOS, BILITOT, PROT, ALBUMIN in the last 168 hours. No results for input(s): LIPASE, AMYLASE in the last 168 hours. No results for  input(s): AMMONIA in the last 168 hours.  CBC: Recent Labs  Lab 10/22/20 1646 10/26/20 1054 10/28/20 0455  WBC 13.7* 14.8* 15.1*  HGB 14.9 14.2 12.6  HCT 44.9 40.2 38.3  MCV 82.8 80.2 82.9  PLT 79* 54* 45*    Cardiac Enzymes: No results for input(s): CKTOTAL, CKMB, CKMBINDEX, TROPONINI in the last 168 hours.  BNP (last 3 results) Recent Labs    09/12/20 0113 09/15/20 0028 10/29/20 0502  BNP 80.1 80.0 553.5*    ProBNP (last 3 results) No results for input(s): PROBNP in the last 8760 hours.  Radiological Exams: DG Chest Port 1 View  Result Date: 10/28/2020 CLINICAL DATA:  Respiratory failure.  History of sickle cell. EXAM: PORTABLE CHEST 1 VIEW COMPARISON:  10/24/2020.  09/29/2020.  CT chest 09/24/2020. FINDINGS: Mediastinum and hilar structures normal. Low lung volumes. Prominent bilateral interstitial infiltrates are again noted without interim change. No pleural effusion or pneumothorax. Heart size stable. Right subclavian stent again noted. Degenerative change thoracic spine. Sclerotic changes noted about the proximal left humerus. Avascular necrosis cannot be excluded. A fracture of the proximal left humerus cannot be completely excluded. Left humeral series can be obtained to further evaluate. IMPRESSION: 1. Low lung volumes. Prominent bilateral interstitial infiltrates are again noted without interim change. 2. Sclerotic changes noted about the proximal left humerus. Avascular necrosis cannot be excluded. A fracture of the proximal  left humerus cannot be completely excluded. Left humeral series can be obtained to further evaluate. Electronically Signed   By: Marcello Moores  Register   On: 10/28/2020 05:56   DG Humerus Left  Result Date: 10/28/2020 CLINICAL DATA:  Pain following fall EXAM: LEFT HUMERUS - 2+ VIEW COMPARISON:  None. FINDINGS: Frontal and lateral views were obtained. No evident fracture or dislocation. The joint spaces appear unremarkable. No erosive change. IMPRESSION:  No fracture or dislocation.  No appreciable arthropathy. Electronically Signed   By: Lowella Grip III M.D.   On: 10/28/2020 12:59    Assessment/Plan Active Problems:   * No active hospital problems. *   1. Acute on chronic respiratory failure hypoxia we will continue with full support on the T collar.  Right now on 100% FiO2 with 35 L flow rate   I have personally seen and evaluated the patient, evaluated laboratory and imaging results, formulated the assessment and plan and placed orders. The Patient requires high complexity decision making with multiple systems involvement.  Rounds were done with the Respiratory Therapy Director and Staff therapists and discussed with nursing staff also.  Allyne Gee, MD [facility] Pulmonary Critical Care Medicine Sleep Medicine

## 2020-10-30 ENCOUNTER — Encounter (HOSPITAL_COMMUNITY): Payer: HMO

## 2020-10-30 LAB — BLOOD GAS, ARTERIAL
Acid-base deficit: 5.6 mmol/L — ABNORMAL HIGH (ref 0.0–2.0)
Bicarbonate: 19.1 mmol/L — ABNORMAL LOW (ref 20.0–28.0)
FIO2: 100
O2 Saturation: 85.6 %
Patient temperature: 36.1
pCO2 arterial: 34.6 mmHg (ref 32.0–48.0)
pH, Arterial: 7.356 (ref 7.350–7.450)
pO2, Arterial: 48.4 mmHg — ABNORMAL LOW (ref 83.0–108.0)

## 2020-10-30 LAB — CBC
HCT: 35.7 % — ABNORMAL LOW (ref 36.0–46.0)
Hemoglobin: 12.4 g/dL (ref 12.0–15.0)
MCH: 27.8 pg (ref 26.0–34.0)
MCHC: 34.7 g/dL (ref 30.0–36.0)
MCV: 80 fL (ref 80.0–100.0)
Platelets: 40 10*3/uL — ABNORMAL LOW (ref 150–400)
RBC: 4.46 MIL/uL (ref 3.87–5.11)
RDW: 19 % — ABNORMAL HIGH (ref 11.5–15.5)
WBC: 11.2 10*3/uL — ABNORMAL HIGH (ref 4.0–10.5)
nRBC: 0 % (ref 0.0–0.2)

## 2020-10-30 LAB — BASIC METABOLIC PANEL
Anion gap: 7 (ref 5–15)
BUN: 56 mg/dL — ABNORMAL HIGH (ref 8–23)
CO2: 19 mmol/L — ABNORMAL LOW (ref 22–32)
Calcium: 9.6 mg/dL (ref 8.9–10.3)
Chloride: 110 mmol/L (ref 98–111)
Creatinine, Ser: 1.59 mg/dL — ABNORMAL HIGH (ref 0.44–1.00)
GFR, Estimated: 36 mL/min — ABNORMAL LOW (ref >60)
Glucose, Bld: 77 mg/dL (ref 70–99)
Potassium: 5.3 mmol/L — ABNORMAL HIGH (ref 3.5–5.1)
Sodium: 136 mmol/L (ref 135–145)

## 2020-10-31 ENCOUNTER — Encounter (HOSPITAL_COMMUNITY): Payer: HMO

## 2020-11-01 LAB — TACROLIMUS LEVEL: Tacrolimus (FK506) - LabCorp: 7 ng/mL (ref 2.0–20.0)

## 2020-11-07 NOTE — Progress Notes (Incomplete)
Pulmonary Critical Care Medicine [place]   PULMONARY CRITICAL CARE SERVICE  PROGRESS NOTE  Date of Service: 11/23/20   [place] Searles  [id number]  DOB: 05/19/1956   DOA: 10/30/2020  Referring Physician: Merton Border, MD  HPI: [name] [name] is a 65 y.o. female seen for follow up of Acute on Chronic Respiratory Failure.  Patient continues to do poorly.  Family has made the patient full DNR but patient is still on BiPAP not yet comfort care  Medications: Reviewed on Rounds  Physical Exam:  Vitals: Temperature is 97.8 pulse 78 respiratory rate was 40 blood pressure is 150/77 saturations 88%  Ventilator Settings on BiPAP 14/8 with an FiO2 of 100%  . General: Comfortable at this time . Eyes: Grossly normal lids, irises & conjunctiva . ENT: grossly tongue is normal . Neck: no obvious mass . Cardiovascular: S1 S2 normal no gallop . Respiratory: Coarse rhonchi expansion is equal at this time . Abdomen: soft . Skin: no rash seen on limited exam . Musculoskeletal: not rigid . Psychiatric:unable to assess . Neurologic: no seizure no involuntary movements         Lab Data:   Basic Metabolic Panel: Recent Labs  Lab 10/26/20 1054 10/28/20 0455 10/29/20 0502 10/30/20 0355  NA 139 137 136 136  K 3.8 4.8 4.8 5.3*  CL 112* 111 108 110  CO2 19* 19* 21* 19*  GLUCOSE 57* 194* 148* 77  BUN 47* 50* 53* 56*  CREATININE 1.25* 1.49* 1.58* 1.59*  CALCIUM 9.9 9.6 9.8 9.6  MG 2.1  --   --   --     ABG: Recent Labs  Lab 10/24/20 1612 10/25/20 0745 10/30/20 1436  PHART 7.414 7.355 7.356  PCO2ART 31.3* 37.3 34.6  PO2ART 69.1* 82.1* 48.4*  HCO3 19.6* 20.4 19.1*  O2SAT 94.3 96.0 85.6    Liver Function Tests: No results for input(s): AST, ALT, ALKPHOS, BILITOT, PROT, ALBUMIN in the last 168 hours. No results for input(s): LIPASE, AMYLASE in the last 168 hours. No results for input(s): AMMONIA in the last 168 hours.  CBC: Recent  Labs  Lab 10/26/20 1054 10/28/20 0455 10/30/20 0355  WBC 14.8* 15.1* 11.2*  HGB 14.2 12.6 12.4  HCT 40.2 38.3 35.7*  MCV 80.2 82.9 80.0  PLT 54* 45* 40*    Cardiac Enzymes: No results for input(s): CKTOTAL, CKMB, CKMBINDEX, TROPONINI in the last 168 hours.  BNP (last 3 results) Recent Labs    09/12/20 0113 09/15/20 0028 10/29/20 0502  BNP 80.1 80.0 553.5*    ProBNP (last 3 results) No results for input(s): PROBNP in the last 8760 hours.  Radiological Exams: No results found.  Assessment/Plan Active Problems:   * No active hospital problems. *   1. Acute on chronic respiratory failure hypoxia patient remains on BiPAP full support overall prognosis quite poor.  Patient is now DNR   I have personally seen and evaluated the patient, evaluated laboratory and imaging results, formulated the assessment and plan and placed orders. The Patient requires high complexity decision making with multiple systems involvement.  Rounds were done with the Respiratory Therapy Director and Staff therapists and discussed with nursing staff also.  Allyne Gee, MD [facility] Pulmonary Critical Care Medicine Sleep Medicine

## 2020-11-07 DEATH — deceased

## 2020-11-12 ENCOUNTER — Ambulatory Visit: Payer: HMO | Admitting: Internal Medicine

## 2021-05-28 ENCOUNTER — Ambulatory Visit: Payer: HMO

## 2021-05-28 ENCOUNTER — Encounter: Payer: HMO | Admitting: Internal Medicine

## 2022-05-10 ENCOUNTER — Encounter (INDEPENDENT_AMBULATORY_CARE_PROVIDER_SITE_OTHER): Payer: PRIVATE HEALTH INSURANCE | Admitting: Ophthalmology
